# Patient Record
Sex: Male | Born: 1945
Health system: Southern US, Community
[De-identification: ages and names within clinical notes are randomized; demographics above are authoritative.]

## PROBLEM LIST (undated history)

## (undated) DIAGNOSIS — E785 Hyperlipidemia, unspecified: Secondary | ICD-10-CM

## (undated) DIAGNOSIS — N529 Male erectile dysfunction, unspecified: Secondary | ICD-10-CM

## (undated) DIAGNOSIS — I2510 Atherosclerotic heart disease of native coronary artery without angina pectoris: Secondary | ICD-10-CM

## (undated) DIAGNOSIS — I272 Pulmonary hypertension, unspecified: Secondary | ICD-10-CM

## (undated) DIAGNOSIS — Z9289 Personal history of other medical treatment: Secondary | ICD-10-CM

## (undated) DIAGNOSIS — I119 Hypertensive heart disease without heart failure: Secondary | ICD-10-CM

## (undated) DIAGNOSIS — I639 Cerebral infarction, unspecified: Secondary | ICD-10-CM

## (undated) DIAGNOSIS — I219 Acute myocardial infarction, unspecified: Secondary | ICD-10-CM

## (undated) DIAGNOSIS — F410 Panic disorder [episodic paroxysmal anxiety] without agoraphobia: Secondary | ICD-10-CM

## (undated) DIAGNOSIS — Z955 Presence of coronary angioplasty implant and graft: Secondary | ICD-10-CM

## (undated) DIAGNOSIS — E119 Type 2 diabetes mellitus without complications: Secondary | ICD-10-CM

## (undated) DIAGNOSIS — Z720 Tobacco use: Secondary | ICD-10-CM

## (undated) HISTORY — DX: Pulmonary hypertension, unspecified: I27.20

## (undated) HISTORY — PX: LEG SURGERY: SHX1003

## (undated) HISTORY — PX: THUMB ARTHROSCOPY: SHX2509

## (undated) HISTORY — DX: Personal history of other medical treatment: Z92.89

## (undated) HISTORY — DX: Hypertensive heart disease without heart failure: I11.9

## (undated) HISTORY — DX: Acute myocardial infarction, unspecified: I21.9

## (undated) HISTORY — DX: Tobacco use: Z72.0

---

## 2005-12-15 ENCOUNTER — Emergency Department (HOSPITAL_COMMUNITY): Admission: EM | Admit: 2005-12-15 | Discharge: 2005-12-15 | Payer: Self-pay | Admitting: Emergency Medicine

## 2007-09-14 ENCOUNTER — Emergency Department (HOSPITAL_COMMUNITY): Admission: EM | Admit: 2007-09-14 | Discharge: 2007-09-14 | Payer: Self-pay | Admitting: Emergency Medicine

## 2008-07-13 ENCOUNTER — Emergency Department (HOSPITAL_COMMUNITY): Admission: EM | Admit: 2008-07-13 | Discharge: 2008-07-14 | Payer: Self-pay | Admitting: Emergency Medicine

## 2009-02-01 ENCOUNTER — Ambulatory Visit (HOSPITAL_COMMUNITY): Admission: RE | Admit: 2009-02-01 | Discharge: 2009-02-01 | Payer: Self-pay | Admitting: Family Medicine

## 2009-02-04 ENCOUNTER — Encounter: Admission: RE | Admit: 2009-02-04 | Discharge: 2009-02-04 | Payer: Self-pay | Admitting: Family Medicine

## 2009-02-23 ENCOUNTER — Ambulatory Visit: Admission: RE | Admit: 2009-02-23 | Discharge: 2009-02-23 | Payer: Self-pay | Admitting: Family Medicine

## 2009-02-28 ENCOUNTER — Encounter: Admission: RE | Admit: 2009-02-28 | Discharge: 2009-02-28 | Payer: Self-pay | Admitting: Cardiology

## 2009-03-24 ENCOUNTER — Ambulatory Visit (HOSPITAL_COMMUNITY): Admission: RE | Admit: 2009-03-24 | Discharge: 2009-03-24 | Payer: Self-pay | Admitting: Cardiology

## 2009-04-07 ENCOUNTER — Encounter: Admission: RE | Admit: 2009-04-07 | Discharge: 2009-04-07 | Payer: Self-pay | Admitting: Neurology

## 2010-03-16 ENCOUNTER — Encounter: Admission: RE | Admit: 2010-03-16 | Discharge: 2010-04-28 | Payer: Self-pay | Admitting: Orthopedic Surgery

## 2010-04-08 ENCOUNTER — Emergency Department (HOSPITAL_BASED_OUTPATIENT_CLINIC_OR_DEPARTMENT_OTHER): Admission: EM | Admit: 2010-04-08 | Discharge: 2010-04-08 | Payer: Self-pay | Admitting: Emergency Medicine

## 2010-10-15 ENCOUNTER — Emergency Department (INDEPENDENT_AMBULATORY_CARE_PROVIDER_SITE_OTHER): Payer: Medicaid Other

## 2010-10-15 ENCOUNTER — Emergency Department (HOSPITAL_BASED_OUTPATIENT_CLINIC_OR_DEPARTMENT_OTHER)
Admission: EM | Admit: 2010-10-15 | Discharge: 2010-10-15 | Disposition: A | Discharge status: Home / Self Care | Payer: Medicaid Other | Attending: Emergency Medicine | Admitting: Emergency Medicine

## 2010-10-15 DIAGNOSIS — F17200 Nicotine dependence, unspecified, uncomplicated: Secondary | ICD-10-CM | POA: Insufficient documentation

## 2010-10-15 DIAGNOSIS — I10 Essential (primary) hypertension: Secondary | ICD-10-CM | POA: Insufficient documentation

## 2010-10-15 DIAGNOSIS — R509 Fever, unspecified: Secondary | ICD-10-CM

## 2010-10-15 DIAGNOSIS — R05 Cough: Secondary | ICD-10-CM

## 2010-10-15 DIAGNOSIS — R059 Cough, unspecified: Secondary | ICD-10-CM

## 2010-10-15 DIAGNOSIS — J40 Bronchitis, not specified as acute or chronic: Secondary | ICD-10-CM | POA: Insufficient documentation

## 2010-10-21 ENCOUNTER — Emergency Department (HOSPITAL_COMMUNITY)
Admission: EM | Admit: 2010-10-21 | Discharge: 2010-10-22 | Disposition: A | Discharge status: Home / Self Care | Payer: Medicaid Other | Attending: Emergency Medicine | Admitting: Emergency Medicine

## 2010-10-21 DIAGNOSIS — R61 Generalized hyperhidrosis: Secondary | ICD-10-CM | POA: Insufficient documentation

## 2010-10-21 DIAGNOSIS — R0989 Other specified symptoms and signs involving the circulatory and respiratory systems: Secondary | ICD-10-CM | POA: Insufficient documentation

## 2010-10-21 DIAGNOSIS — J40 Bronchitis, not specified as acute or chronic: Secondary | ICD-10-CM | POA: Insufficient documentation

## 2010-10-21 DIAGNOSIS — R05 Cough: Secondary | ICD-10-CM | POA: Insufficient documentation

## 2010-10-21 DIAGNOSIS — R059 Cough, unspecified: Secondary | ICD-10-CM | POA: Insufficient documentation

## 2010-10-21 DIAGNOSIS — E669 Obesity, unspecified: Secondary | ICD-10-CM | POA: Insufficient documentation

## 2010-10-21 DIAGNOSIS — R0602 Shortness of breath: Secondary | ICD-10-CM | POA: Insufficient documentation

## 2010-10-21 DIAGNOSIS — R0609 Other forms of dyspnea: Secondary | ICD-10-CM | POA: Insufficient documentation

## 2010-10-21 DIAGNOSIS — R000 Tachycardia, unspecified: Secondary | ICD-10-CM | POA: Insufficient documentation

## 2010-10-21 DIAGNOSIS — I10 Essential (primary) hypertension: Secondary | ICD-10-CM | POA: Insufficient documentation

## 2010-10-21 DIAGNOSIS — R079 Chest pain, unspecified: Secondary | ICD-10-CM | POA: Insufficient documentation

## 2010-10-21 DIAGNOSIS — Z79899 Other long term (current) drug therapy: Secondary | ICD-10-CM | POA: Insufficient documentation

## 2010-10-21 DIAGNOSIS — R0682 Tachypnea, not elsewhere classified: Secondary | ICD-10-CM | POA: Insufficient documentation

## 2010-10-21 DIAGNOSIS — R062 Wheezing: Secondary | ICD-10-CM | POA: Insufficient documentation

## 2010-10-21 DIAGNOSIS — F411 Generalized anxiety disorder: Secondary | ICD-10-CM | POA: Insufficient documentation

## 2010-10-21 DIAGNOSIS — R6883 Chills (without fever): Secondary | ICD-10-CM | POA: Insufficient documentation

## 2010-10-22 ENCOUNTER — Emergency Department (HOSPITAL_COMMUNITY): Payer: Medicaid Other

## 2010-10-22 LAB — CBC
HCT: 40.8 % (ref 39.0–52.0)
Hemoglobin: 14.1 g/dL (ref 13.0–17.0)
MCH: 30.3 pg (ref 26.0–34.0)
MCHC: 34.6 g/dL (ref 30.0–36.0)
MCV: 87.6 fL (ref 78.0–100.0)
RDW: 13.9 % (ref 11.5–15.5)
WBC: 13.5 10*3/uL — ABNORMAL HIGH (ref 4.0–10.5)

## 2010-10-22 LAB — DIFFERENTIAL
Basophils Absolute: 0.1 10*3/uL (ref 0.0–0.1)
Basophils Relative: 0 % (ref 0–1)
Eosinophils Relative: 2 % (ref 0–5)
Monocytes Absolute: 1.3 10*3/uL — ABNORMAL HIGH (ref 0.1–1.0)
Monocytes Relative: 9 % (ref 3–12)

## 2010-10-22 LAB — BASIC METABOLIC PANEL
Calcium: 8.7 mg/dL (ref 8.4–10.5)
Creatinine, Ser: 1.2 mg/dL (ref 0.4–1.5)
GFR calc non Af Amer: >60 mL/min (ref >60)

## 2010-12-10 LAB — POCT I-STAT 3, VENOUS BLOOD GAS (G3P V)
Bicarbonate: 27.5 mEq/L — ABNORMAL HIGH (ref 20.0–24.0)
O2 Saturation: 64 %
O2 Saturation: 69 %
TCO2: 28 mmol/L (ref 0–100)
TCO2: 29 mmol/L (ref 0–100)
pH, Ven: 7.399 — ABNORMAL HIGH (ref 7.250–7.300)
pH, Ven: 7.411 — ABNORMAL HIGH (ref 7.250–7.300)

## 2010-12-10 LAB — POCT I-STAT 3, ART BLOOD GAS (G3+)
Acid-Base Excess: 1 mmol/L (ref 0.0–2.0)
Bicarbonate: 25.5 mEq/L — ABNORMAL HIGH (ref 20.0–24.0)

## 2011-05-03 DIAGNOSIS — J449 Chronic obstructive pulmonary disease, unspecified: Secondary | ICD-10-CM | POA: Diagnosis present

## 2011-05-03 DIAGNOSIS — F17200 Nicotine dependence, unspecified, uncomplicated: Secondary | ICD-10-CM | POA: Insufficient documentation

## 2011-05-03 DIAGNOSIS — R52 Pain, unspecified: Secondary | ICD-10-CM | POA: Insufficient documentation

## 2011-05-03 DIAGNOSIS — N529 Male erectile dysfunction, unspecified: Secondary | ICD-10-CM | POA: Diagnosis present

## 2011-09-19 ENCOUNTER — Emergency Department (HOSPITAL_BASED_OUTPATIENT_CLINIC_OR_DEPARTMENT_OTHER)
Admission: EM | Admit: 2011-09-19 | Discharge: 2011-09-20 | Disposition: A | Discharge status: Home / Self Care | Payer: Medicare Other | Attending: Emergency Medicine | Admitting: Emergency Medicine

## 2011-09-19 ENCOUNTER — Encounter (HOSPITAL_BASED_OUTPATIENT_CLINIC_OR_DEPARTMENT_OTHER): Payer: Self-pay | Admitting: *Deleted

## 2011-09-19 ENCOUNTER — Emergency Department (INDEPENDENT_AMBULATORY_CARE_PROVIDER_SITE_OTHER): Payer: Medicare Other

## 2011-09-19 DIAGNOSIS — S93409A Sprain of unspecified ligament of unspecified ankle, initial encounter: Secondary | ICD-10-CM

## 2011-09-19 DIAGNOSIS — Z8679 Personal history of other diseases of the circulatory system: Secondary | ICD-10-CM | POA: Insufficient documentation

## 2011-09-19 DIAGNOSIS — F17200 Nicotine dependence, unspecified, uncomplicated: Secondary | ICD-10-CM | POA: Insufficient documentation

## 2011-09-19 DIAGNOSIS — S99929A Unspecified injury of unspecified foot, initial encounter: Secondary | ICD-10-CM

## 2011-09-19 DIAGNOSIS — I10 Essential (primary) hypertension: Secondary | ICD-10-CM | POA: Insufficient documentation

## 2011-09-19 DIAGNOSIS — M25579 Pain in unspecified ankle and joints of unspecified foot: Secondary | ICD-10-CM

## 2011-09-19 DIAGNOSIS — W19XXXA Unspecified fall, initial encounter: Secondary | ICD-10-CM

## 2011-09-19 DIAGNOSIS — X500XXA Overexertion from strenuous movement or load, initial encounter: Secondary | ICD-10-CM | POA: Insufficient documentation

## 2011-09-19 HISTORY — DX: Cerebral infarction, unspecified: I63.9

## 2011-09-19 HISTORY — DX: Panic disorder (episodic paroxysmal anxiety): F41.0

## 2011-09-19 NOTE — ED Notes (Signed)
Pt slipped in mud and fell tonight injuring left ankle, swelling noted.

## 2011-09-20 MED ORDER — IBUPROFEN 800 MG PO TABS
800.0000 mg | ORAL_TABLET | Freq: Once | ORAL | Status: AC
  Administered 2011-09-20: 800 mg via ORAL
  Filled 2011-09-20: qty 1

## 2011-09-20 MED ORDER — IBUPROFEN 800 MG PO TABS: 800.0000 mg | ORAL_TABLET | Freq: Once | ORAL | Status: AC

## 2011-09-20 NOTE — ED Provider Notes (Signed)
History     CSN: [id number]  Arrival date & time 09/19/11  81   First MD Initiated Contact with Patient 09/20/11 0025      Chief Complaint  Patient presents with  . Ankle Pain   Patient states he slipped and twisted his left ankle. Apparently, he was walking on some grass and slipped in the mud. Patient was concerned for the possibility of fracture, but denies any other injuries. Denies any proximal tibial tenderness. Denies any numbness, weakness or tingling. Denies any lacerations. (Consider location/radiation/quality/duration/timing/severity/associated sxs/prior treatment) HPI  Past Medical History  Diagnosis Date  . Stroke   . Hypertension   . Panic attack     History reviewed. No pertinent past surgical history.  History reviewed. No pertinent family history.  History  Substance Use Topics  . Smoking status: Current Everyday Smoker  . Smokeless tobacco: Not on file  . Alcohol Use: No      Review of Systems  All other systems reviewed and are negative.    Allergies  Review of patient's allergies indicates no known allergies.  Home Medications   Current Outpatient Rx  Name Route Sig Dispense Refill  . KLONOPIN PO Oral Take 1 tablet by mouth 3 (three) times daily as needed. For anxiety    . PERCOCET PO Oral Take 1 tablet by mouth 2 (two) times daily as needed. For pain    . PRESCRIPTION MEDICATION Oral Take 1 tablet by mouth daily. Unknown blood pressure medication    . IBUPROFEN 800 MG PO TABS Oral Take 1 tablet (800 mg total) by mouth once. 30 tablet 0    BP 165/83  Pulse 77  Temp(Src) 98.1 F (36.7 C) (Oral)  Resp 20  Ht 5\' 9"  (1.753 m)  Wt 244 lb (110.678 kg)  BMI 36.03 kg/m2  SpO2 98%  Physical Exam  Nursing note and vitals reviewed. Constitutional: He appears well-developed and well-nourished. No distress.  HENT:  Head: Normocephalic.  Eyes: Pupils are equal, round, and reactive to light.  Cardiovascular: Normal heart sounds.     Pulmonary/Chest: Breath sounds normal.  Abdominal: Soft.  Musculoskeletal: Normal range of motion. He exhibits edema and tenderness.       Mild diffuse ankle tenderness and mild swelling. No ecchymoses. No step-offs of deformities. No tibial or fibular tenderness.  Neurological: He is alert.  Skin: Skin is warm and dry.    ED Course  Procedures (including critical care time)  Labs Reviewed - No data to display Dg Ankle Complete Left  09/19/2011  *RADIOLOGY REPORT*  Clinical Data: Status post fall; injury to left ankle, with anterior and lateral left ankle pain.  LEFT ANKLE COMPLETE - 3+ VIEW  Comparison: None.  Findings: There is no evidence of fracture or dislocation.  The ankle mortise is intact; the interosseous space is within normal limits.  No talar tilt or subluxation is seen.  A small well- corticated osseous fragment at the base of the fifth metatarsal may reflect remote injury.  A plantar calcaneal spur is incidentally seen.  The joint spaces are preserved.  Mild lateral soft tissue swelling is noted.  IMPRESSION: No evidence of fracture or dislocation.  Original Report Authenticated By: Santa Lighter, M.D.     1. Ankle sprain       MDM  Patient is seen and examined, initial history and physical is completed. Evaluation initiated        Davian Hanshaw A. Lauris Poag, MD 09/20/11 [phone]

## 2011-12-28 DIAGNOSIS — G8929 Other chronic pain: Secondary | ICD-10-CM | POA: Diagnosis present

## 2012-01-26 DIAGNOSIS — G4700 Insomnia, unspecified: Secondary | ICD-10-CM | POA: Insufficient documentation

## 2012-06-26 DIAGNOSIS — M25519 Pain in unspecified shoulder: Secondary | ICD-10-CM | POA: Diagnosis present

## 2012-06-26 DIAGNOSIS — I639 Cerebral infarction, unspecified: Secondary | ICD-10-CM | POA: Diagnosis present

## 2012-06-26 DIAGNOSIS — F411 Generalized anxiety disorder: Secondary | ICD-10-CM | POA: Insufficient documentation

## 2012-08-04 ENCOUNTER — Inpatient Hospital Stay (HOSPITAL_COMMUNITY)
Admission: EM | Admit: 2012-08-04 | Discharge: 2012-08-07 | DRG: 247 | Disposition: A | Discharge status: Home / Self Care | Payer: Medicare Other | Attending: Cardiovascular Disease | Admitting: Cardiovascular Disease

## 2012-08-04 ENCOUNTER — Encounter (HOSPITAL_COMMUNITY)
Admission: EM | Disposition: A | Discharge status: Home / Self Care | Payer: Self-pay | Source: Home / Self Care | Attending: Cardiovascular Disease

## 2012-08-04 ENCOUNTER — Emergency Department (HOSPITAL_COMMUNITY): Payer: Medicare Other

## 2012-08-04 ENCOUNTER — Encounter (HOSPITAL_COMMUNITY): Payer: Self-pay | Admitting: *Deleted

## 2012-08-04 DIAGNOSIS — I639 Cerebral infarction, unspecified: Secondary | ICD-10-CM | POA: Diagnosis present

## 2012-08-04 DIAGNOSIS — I2119 ST elevation (STEMI) myocardial infarction involving other coronary artery of inferior wall: Principal | ICD-10-CM | POA: Diagnosis present

## 2012-08-04 DIAGNOSIS — J4489 Other specified chronic obstructive pulmonary disease: Secondary | ICD-10-CM | POA: Diagnosis present

## 2012-08-04 DIAGNOSIS — M549 Dorsalgia, unspecified: Secondary | ICD-10-CM | POA: Diagnosis present

## 2012-08-04 DIAGNOSIS — F410 Panic disorder [episodic paroxysmal anxiety] without agoraphobia: Secondary | ICD-10-CM

## 2012-08-04 DIAGNOSIS — I219 Acute myocardial infarction, unspecified: Secondary | ICD-10-CM

## 2012-08-04 DIAGNOSIS — I10 Essential (primary) hypertension: Secondary | ICD-10-CM

## 2012-08-04 DIAGNOSIS — Z8673 Personal history of transient ischemic attack (TIA), and cerebral infarction without residual deficits: Secondary | ICD-10-CM

## 2012-08-04 DIAGNOSIS — E785 Hyperlipidemia, unspecified: Secondary | ICD-10-CM

## 2012-08-04 DIAGNOSIS — E119 Type 2 diabetes mellitus without complications: Secondary | ICD-10-CM

## 2012-08-04 DIAGNOSIS — G8929 Other chronic pain: Secondary | ICD-10-CM | POA: Diagnosis present

## 2012-08-04 DIAGNOSIS — Z79899 Other long term (current) drug therapy: Secondary | ICD-10-CM

## 2012-08-04 DIAGNOSIS — I4510 Unspecified right bundle-branch block: Secondary | ICD-10-CM | POA: Diagnosis present

## 2012-08-04 DIAGNOSIS — F17200 Nicotine dependence, unspecified, uncomplicated: Secondary | ICD-10-CM | POA: Diagnosis present

## 2012-08-04 DIAGNOSIS — I2510 Atherosclerotic heart disease of native coronary artery without angina pectoris: Secondary | ICD-10-CM

## 2012-08-04 DIAGNOSIS — Z6835 Body mass index (BMI) 35.0-35.9, adult: Secondary | ICD-10-CM

## 2012-08-04 DIAGNOSIS — E669 Obesity, unspecified: Secondary | ICD-10-CM | POA: Diagnosis present

## 2012-08-04 DIAGNOSIS — Z955 Presence of coronary angioplasty implant and graft: Secondary | ICD-10-CM

## 2012-08-04 DIAGNOSIS — Z720 Tobacco use: Secondary | ICD-10-CM

## 2012-08-04 DIAGNOSIS — J449 Chronic obstructive pulmonary disease, unspecified: Secondary | ICD-10-CM | POA: Diagnosis present

## 2012-08-04 DIAGNOSIS — I213 ST elevation (STEMI) myocardial infarction of unspecified site: Secondary | ICD-10-CM

## 2012-08-04 HISTORY — PX: PERCUTANEOUS CORONARY STENT INTERVENTION (PCI-S): SHX5485

## 2012-08-04 HISTORY — DX: Atherosclerotic heart disease of native coronary artery without angina pectoris: I25.10

## 2012-08-04 HISTORY — PX: LEFT HEART CATHETERIZATION WITH CORONARY ANGIOGRAM: SHX5451

## 2012-08-04 HISTORY — DX: Hyperlipidemia, unspecified: E78.5

## 2012-08-04 HISTORY — DX: Type 2 diabetes mellitus without complications: E11.9

## 2012-08-04 HISTORY — PX: CARDIAC CATHETERIZATION: SHX172

## 2012-08-04 HISTORY — DX: Presence of coronary angioplasty implant and graft: Z95.5

## 2012-08-04 HISTORY — DX: Acute myocardial infarction, unspecified: I21.9

## 2012-08-04 LAB — BASIC METABOLIC PANEL
BUN: 17 mg/dL (ref 6–23)
CO2: 23 mEq/L (ref 19–32)
Calcium: 9.3 mg/dL (ref 8.4–10.5)
Chloride: 103 mEq/L (ref 96–112)
Creatinine, Ser: 1.1 mg/dL (ref 0.50–1.35)
GFR calc Af Amer: 79 mL/min — ABNORMAL LOW (ref >90)
GFR calc non Af Amer: 68 mL/min — ABNORMAL LOW (ref >90)
Glucose, Bld: 138 mg/dL — ABNORMAL HIGH (ref 70–99)
Potassium: 3.9 mEq/L (ref 3.5–5.1)
Sodium: 137 mEq/L (ref 135–145)

## 2012-08-04 LAB — CBC
HCT: 44.1 % (ref 39.0–52.0)
Hemoglobin: 14.8 g/dL (ref 13.0–17.0)
MCH: 29.6 pg (ref 26.0–34.0)
MCHC: 33.6 g/dL (ref 30.0–36.0)
MCV: 88.2 fL (ref 78.0–100.0)
Platelets: 275 10*3/uL (ref 150–400)
RBC: 5 MIL/uL (ref 4.22–5.81)
RDW: 13.6 % (ref 11.5–15.5)
WBC: 13.8 10*3/uL — ABNORMAL HIGH (ref 4.0–10.5)

## 2012-08-04 LAB — MRSA PCR SCREENING: MRSA by PCR: NEGATIVE

## 2012-08-04 LAB — TROPONIN I
Troponin I: <0.3 ng/mL (ref <0.30)
Troponin I: 0.36 ng/mL (ref <0.30)

## 2012-08-04 SURGERY — LEFT HEART CATHETERIZATION WITH CORONARY ANGIOGRAM
Anesthesia: LOCAL | Laterality: Right

## 2012-08-04 MED ORDER — LIDOCAINE HCL (PF) 1 % IJ SOLN
INTRAMUSCULAR | Status: AC
  Filled 2012-08-04: qty 30

## 2012-08-04 MED ORDER — ASPIRIN EC 81 MG PO TBEC
81.0000 mg | DELAYED_RELEASE_TABLET | Freq: Every day | ORAL | Status: DC
  Administered 2012-08-05 – 2012-08-07 (×3): 81 mg via ORAL
  Filled 2012-08-04 (×3): qty 1

## 2012-08-04 MED ORDER — HEPARIN BOLUS VIA INFUSION
4000.0000 [IU] | Freq: Once | INTRAVENOUS | Status: AC
  Administered 2012-08-04: 4000 [IU] via INTRAVENOUS

## 2012-08-04 MED ORDER — METOPROLOL TARTRATE 1 MG/ML IV SOLN
INTRAVENOUS | Status: AC
  Administered 2012-08-04: 5 mg
  Filled 2012-08-04: qty 15

## 2012-08-04 MED ORDER — TICAGRELOR 90 MG PO TABS
ORAL_TABLET | ORAL | Status: AC
  Filled 2012-08-04: qty 2

## 2012-08-04 MED ORDER — BIVALIRUDIN BOLUS VIA INFUSION
0.1000 mg/kg | Freq: Once | INTRAVENOUS | Status: DC
  Filled 2012-08-04: qty 12

## 2012-08-04 MED ORDER — ALBUTEROL SULFATE (5 MG/ML) 0.5% IN NEBU
2.5000 mg | INHALATION_SOLUTION | RESPIRATORY_TRACT | Status: DC
  Administered 2012-08-04: 2.5 mg via RESPIRATORY_TRACT
  Filled 2012-08-04: qty 0.5

## 2012-08-04 MED ORDER — ONDANSETRON HCL 4 MG/2ML IJ SOLN: 4.0000 mg | Freq: Four times a day (QID) | INTRAMUSCULAR | Status: DC | PRN

## 2012-08-04 MED ORDER — SODIUM CHLORIDE 0.9 % IV SOLN
INTRAVENOUS | Status: DC
  Administered 2012-08-04: 22:00:00 via INTRAVENOUS

## 2012-08-04 MED ORDER — POTASSIUM CHLORIDE 10 MEQ/100ML IV SOLN
INTRAVENOUS | Status: AC
  Filled 2012-08-04: qty 200

## 2012-08-04 MED ORDER — BIVALIRUDIN 250 MG IV SOLR
INTRAVENOUS | Status: AC
  Filled 2012-08-04: qty 250

## 2012-08-04 MED ORDER — SODIUM CHLORIDE 0.9 % IV SOLN
0.2500 mg/kg/h | INTRAVENOUS | Status: AC
  Administered 2012-08-04: 0.25 mg/kg/h via INTRAVENOUS
  Filled 2012-08-04: qty 250

## 2012-08-04 MED ORDER — NITROGLYCERIN 0.2 MG/ML ON CALL CATH LAB
INTRAVENOUS | Status: AC
  Filled 2012-08-04: qty 1

## 2012-08-04 MED ORDER — MORPHINE SULFATE 2 MG/ML IJ SOLN
2.0000 mg | INTRAMUSCULAR | Status: DC | PRN
  Administered 2012-08-05: 2 mg via INTRAVENOUS
  Filled 2012-08-04: qty 1

## 2012-08-04 MED ORDER — ONDANSETRON HCL 4 MG/2ML IJ SOLN
INTRAMUSCULAR | Status: AC
  Administered 2012-08-04: 4 mg
  Filled 2012-08-04: qty 2

## 2012-08-04 MED ORDER — TIOTROPIUM BROMIDE MONOHYDRATE 18 MCG IN CAPS
18.0000 ug | ORAL_CAPSULE | Freq: Every day | RESPIRATORY_TRACT | Status: DC
  Administered 2012-08-05 – 2012-08-07 (×3): 18 ug via RESPIRATORY_TRACT
  Filled 2012-08-04: qty 5

## 2012-08-04 MED ORDER — MIDAZOLAM HCL 2 MG/2ML IJ SOLN
INTRAMUSCULAR | Status: AC
  Filled 2012-08-04: qty 2

## 2012-08-04 MED ORDER — FENTANYL CITRATE 0.05 MG/ML IJ SOLN
INTRAMUSCULAR | Status: AC
  Filled 2012-08-04: qty 2

## 2012-08-04 MED ORDER — CLONAZEPAM 1 MG PO TABS: 1.0000 mg | ORAL_TABLET | Freq: Two times a day (BID) | ORAL | Status: DC | PRN

## 2012-08-04 MED ORDER — ACETAMINOPHEN 325 MG PO TABS: 650.0000 mg | ORAL_TABLET | ORAL | Status: DC | PRN

## 2012-08-04 MED ORDER — CITALOPRAM HYDROBROMIDE 10 MG PO TABS
10.0000 mg | ORAL_TABLET | Freq: Every day | ORAL | Status: DC
  Administered 2012-08-05 – 2012-08-07 (×3): 10 mg via ORAL
  Filled 2012-08-04 (×3): qty 1

## 2012-08-04 MED ORDER — NITROGLYCERIN 0.4 MG SL SUBL: 0.4000 mg | SUBLINGUAL_TABLET | SUBLINGUAL | Status: DC | PRN

## 2012-08-04 MED ORDER — NITROGLYCERIN IN D5W 200-5 MCG/ML-% IV SOLN
2.0000 ug/min | INTRAVENOUS | Status: DC
  Administered 2012-08-04: 20 ug/min via INTRAVENOUS

## 2012-08-04 MED ORDER — METOPROLOL SUCCINATE ER 100 MG PO TB24
100.0000 mg | ORAL_TABLET | Freq: Every day | ORAL | Status: DC
  Administered 2012-08-05: 100 mg via ORAL
  Filled 2012-08-04 (×2): qty 1

## 2012-08-04 MED ORDER — TICAGRELOR 90 MG PO TABS
90.0000 mg | ORAL_TABLET | Freq: Two times a day (BID) | ORAL | Status: DC
  Administered 2012-08-05 – 2012-08-07 (×5): 90 mg via ORAL
  Filled 2012-08-04 (×6): qty 1

## 2012-08-04 MED ORDER — HEPARIN (PORCINE) IN NACL 2-0.9 UNIT/ML-% IJ SOLN
INTRAMUSCULAR | Status: AC
  Filled 2012-08-04: qty 1000

## 2012-08-04 MED ORDER — POTASSIUM CHLORIDE 10 MEQ/100ML IV SOLN
10.0000 meq | INTRAVENOUS | Status: AC
  Administered 2012-08-04 (×2): 10 meq via INTRAVENOUS

## 2012-08-04 MED ORDER — MORPHINE SULFATE 4 MG/ML IJ SOLN
INTRAMUSCULAR | Status: AC
  Administered 2012-08-04: 4 mg
  Filled 2012-08-04: qty 1

## 2012-08-04 MED ORDER — AMLODIPINE BESYLATE 5 MG PO TABS
5.0000 mg | ORAL_TABLET | Freq: Every day | ORAL | Status: DC
  Administered 2012-08-05: 5 mg via ORAL
  Filled 2012-08-04 (×2): qty 1

## 2012-08-04 MED ORDER — HEPARIN (PORCINE) IN NACL 2-0.9 UNIT/ML-% IJ SOLN
INTRAMUSCULAR | Status: AC
  Filled 2012-08-04: qty 500

## 2012-08-04 MED ORDER — SODIUM CHLORIDE 0.9 % IV SOLN: Freq: Once | INTRAVENOUS | Status: DC

## 2012-08-04 MED ORDER — MORPHINE SULFATE 2 MG/ML IJ SOLN
INTRAMUSCULAR | Status: AC
  Administered 2012-08-04: 2 mg via INTRAVENOUS
  Filled 2012-08-04: qty 1

## 2012-08-04 MED ORDER — ATORVASTATIN CALCIUM 80 MG PO TABS
80.0000 mg | ORAL_TABLET | Freq: Every day | ORAL | Status: DC
  Administered 2012-08-04 – 2012-08-06 (×3): 80 mg via ORAL
  Filled 2012-08-04 (×4): qty 1

## 2012-08-04 MED ORDER — HEPARIN (PORCINE) IN NACL 100-0.45 UNIT/ML-% IJ SOLN
1500.0000 [IU]/h | INTRAMUSCULAR | Status: DC
  Administered 2012-08-04: 1500 [IU]/h via INTRAVENOUS
  Filled 2012-08-04: qty 250

## 2012-08-04 NOTE — ED Provider Notes (Signed)
History    66 year old male presenting as a STEMI alert. Partially 45 minutes prior to arrival in emergency room patient began with sudden onset substernal chest pain. Associated with nausea and vomiting and sensation of numbness in both of his arms. Prehospital EKG consistent with STEMI. He received aspirin and sublingual nitroglycerin x3 prior to arrival. On arrival patient states persistent pain although somewhat improved from onset. Patient has a past history of hypertension. He is a smoker. He states that he has previously been evaluated by cardiology and had a cardiac catheterization, but he cannot remember who he specifically saw.   CSN: [id number]  Arrival date & time 08/04/12  1931   None     Chief Complaint  Patient presents with  . Code STEMI    (Consider location/radiation/quality/duration/timing/severity/associated sxs/prior treatment) HPI  Past Medical History  Diagnosis Date  . Stroke   . Hypertension   . Panic attack     No past surgical history on file.  No family history on file.  History  Substance Use Topics  . Smoking status: Current Every Day Smoker  . Smokeless tobacco: Not on file  . Alcohol Use: No      Review of Systems   Review of symptoms negative unless otherwise noted in HPI.   Allergies  Review of patient's allergies indicates no known allergies.  Home Medications   Current Outpatient Rx  Name  Route  Sig  Dispense  Refill  . KLONOPIN PO   Oral   Take 1 tablet by mouth 3 (three) times daily as needed. For anxiety         . PERCOCET PO   Oral   Take 1 tablet by mouth 2 (two) times daily as needed. For pain         . PRESCRIPTION MEDICATION   Oral   Take 1 tablet by mouth daily. Unknown blood pressure medication           BP 109/75  Pulse 91  Resp 14  Ht 5\' 9"  (1.753 m)  Wt 245 lb (111.131 kg)  BMI 36.18 kg/m2  SpO2 92%  Physical Exam  Nursing note and vitals reviewed. Constitutional: He appears  well-developed and well-nourished. He appears distressed.       Sitting up in stretcher. Mildly uncomfortable appearing. Obese.  HENT:  Head: Normocephalic and atraumatic.  Eyes: Conjunctivae normal are normal. Right eye exhibits no discharge. Left eye exhibits no discharge.  Neck: Neck supple.  Cardiovascular: Normal rate, regular rhythm and normal heart sounds.  Exam reveals no gallop and no friction rub.   No murmur heard.      Regular rate and rhythm. No murmur appreciated.  Pulmonary/Chest: Effort normal and breath sounds normal. No respiratory distress.       No increased work of breathing. Breath sounds symmetric bilaterally. Lungs clear.  Abdominal: Soft. He exhibits no distension. There is no tenderness.  Musculoskeletal: He exhibits no edema and no tenderness.       Lower extremities symmetric as compared to each other. No calf tenderness. Negative Homan's. No palpable cords.   Neurological: He is alert.  Skin: Skin is warm and dry. He is not diaphoretic.  Psychiatric: He has a normal mood and affect. His behavior is normal. Thought content normal.    ED Course  Procedures (including critical care time)  Labs Reviewed  CBC - Abnormal; Notable for the following:    WBC 13.8 (*)     All other components within normal limits  BASIC METABOLIC PANEL - Abnormal; Notable for the following:    Glucose, Bld 138 (*)     GFR calc non Af Amer 68 (*)     GFR calc Af Amer 79 (*)     All other components within normal limits  BASIC METABOLIC PANEL - Abnormal; Notable for the following:    Glucose, Bld 110 (*)     GFR calc non Af Amer 85 (*)     All other components within normal limits  CBC - Abnormal; Notable for the following:    WBC 10.7 (*)     All other components within normal limits  HEMOGLOBIN A1C - Abnormal; Notable for the following:    Hemoglobin A1C 6.7 (*)     Mean Plasma Glucose 146 (*)     All other components within normal limits  TROPONIN I - Abnormal; Notable  for the following:    Troponin I 0.36 (*)     All other components within normal limits  TROPONIN I - Abnormal; Notable for the following:    Troponin I 0.98 (*)     All other components within normal limits  TROPONIN I - Abnormal; Notable for the following:    Troponin I 1.42 (*)     All other components within normal limits  LIPID PANEL - Abnormal; Notable for the following:    Cholesterol 257 (*)     Triglycerides 296 (*)     HDL 32 (*)     VLDL 59 (*)     LDL Cholesterol 166 (*)     All other components within normal limits  CK TOTAL AND CKMB - Abnormal; Notable for the following:    CK, MB 19.3 (*)     Relative Index 11.1 (*)     All other components within normal limits  TROPONIN I - Abnormal; Notable for the following:    Troponin I 1.47 (*)     All other components within normal limits  TROPONIN I - Abnormal; Notable for the following:    Troponin I 1.06 (*)     All other components within normal limits  BASIC METABOLIC PANEL - Abnormal; Notable for the following:    Glucose, Bld 111 (*)     GFR calc non Af Amer 67 (*)     GFR calc Af Amer 78 (*)     All other components within normal limits  CBC - Abnormal; Notable for the following:    WBC 10.8 (*)     All other components within normal limits  TROPONIN I - Abnormal; Notable for the following:    Troponin I 0.86 (*)     All other components within normal limits  POCT I-STAT, CHEM 8 - Abnormal; Notable for the following:    Sodium 121 (*)     Potassium 3.2 (*)     Chloride 88 (*)     Calcium, Ion 1.11 (*)     All other components within normal limits  GLUCOSE, CAPILLARY - Abnormal; Notable for the following:    Glucose-Capillary 243 (*)     All other components within normal limits  GLUCOSE, CAPILLARY - Abnormal; Notable for the following:    Glucose-Capillary 115 (*)     All other components within normal limits  GLUCOSE, CAPILLARY - Abnormal; Notable for the following:    Glucose-Capillary 108 (*)     All  other components within normal limits  GLUCOSE, CAPILLARY - Abnormal; Notable for the following:  Glucose-Capillary 138 (*)     All other components within normal limits  GLUCOSE, CAPILLARY - Abnormal; Notable for the following:    Glucose-Capillary 113 (*)     All other components within normal limits  GLUCOSE, CAPILLARY - Abnormal; Notable for the following:    Glucose-Capillary 134 (*)     All other components within normal limits  GLUCOSE, CAPILLARY - Abnormal; Notable for the following:    Glucose-Capillary 104 (*)     All other components within normal limits  TROPONIN I  MRSA PCR SCREENING  TSH  MAGNESIUM  GLUCOSE, CAPILLARY  POCT ACTIVATED CLOTTING TIME  LAB REPORT - SCANNED   Dg Chest Portable 1 View  08/04/2012  *RADIOLOGY REPORT*  Clinical Data: Code stemi.  PORTABLE CHEST - 1 VIEW  Comparison: 10/22/2010.  Findings: Interval enlargement of the cardiac silhouette and prominence of the pulmonary vasculature.  Clear lungs.  Metallic density overlying the inferior aspect of the chest.  Unremarkable bones.  IMPRESSION: Interval cardiomegaly and pulmonary vascular congestion.   Original Report Authenticated By: Claudie Revering, M.D.    EKG:  Rhythm: normal sinus Rate: 70s Axis: normal Intervals: normal ST segments: inferior STE   1. STEMI (ST elevation myocardial infarction)   2. Stented coronary artery   3. STEMI (ST elevation myocardial infarction): Inferior, s/p Promus DES to RCA 08/04/12-emergently   4. HTN (hypertension)   5. Tobacco abuse   6. History of CVA (cerebrovascular accident):  Apprx four years ago.   7. Panic attack, History of..   8. CAD (coronary artery disease),residual non obstructive disease   9. Hyperlipidemia   10. DM (diabetes mellitus), type 2 new diagnosis       MDM  58:21 PM 66 year old male with chest pain, nausea vomiting and numbness in his arms. STEMI alert was called prehospital. Patient's prehospital EKG and repeat upon arrival showed  several millimeters of ST segment elevation inferiorly with reciprocal lateral depression. Anteroseptal involvement as well. Patient received aspirin prehospital. Pharmacy present on patient arrival and working on heparin orders. Deferred from additional nitrates at this time given that patient normotensive and potential for RV involvement. Discussed with Dr Claiborne Billings, cardiology.        Virgel Manifold, MD 08/08/12 [phone]

## 2012-08-04 NOTE — ED Notes (Signed)
Patient taken to the cath lab by [facility], RN

## 2012-08-04 NOTE — Progress Notes (Signed)
Medications obtained from [facility] emergently as pt on the cath lab table with STEMI. Unable to definitely determine last beta blocker administration time. His mother said he left her house today around 1100 to go home and take his medications. Thanks  [employer]. Bobby [name], PharmD, [place] Clinical Staff Pharmacist Pager [phone]

## 2012-08-04 NOTE — ED Notes (Signed)
Patient called EMS with c/o numbness to his arms, N/V all sudden onset   EMS adm Morphine 4mg , Zofran 4mg , ASA 324mg , NTG 3  Continues to c/o pain upon arrival

## 2012-08-04 NOTE — H&P (Signed)
Bobby [name] is an 66 y.o. male.   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  He developed CP at 1800 hrs today.  He reports bilateral arm heaviness, nausea and vomiting.  He had to pull over in his truck twice to throw up.     Medications: albuterol (PROVENTIL) (2.5 MG/3ML) 0.083% nebulizer solution Take 5 mg by nebulization every 4 (four) hours as needed.  amLODipine (NORVASC) 5 MG tablet Take 5 mg by mouth daily.  citalopram (CELEXA) 10 MG tablet Take 10 mg by mouth daily.  clonazePAM (KLONOPIN) 1 MG tablet Take 1 mg by mouth 2 (two) times daily as needed.  hydrochlorothiazide (MICROZIDE) 12.5 MG capsule Take 25 mg by mouth daily.  lisinopril (PRINIVIL,ZESTRIL) 40 MG tablet Take 40 mg by mouth daily. metoprolol succinate (TOPROL-XL) 100 MG 24 hr tablet Take 100 mg by mouth daily. oxyCODONE-acetaminophen (PERCOCET) 10-325 MG per tablet Take 1 tablet by mouth every 8 (eight) hours as needed.  tiotropium (SPIRIVA) 18 MCG inhalation capsule   Past Medical History  Diagnosis Date  . Stroke   . Hypertension   . Panic attack      History reviewed. No pertinent past surgical history.  History reviewed. No pertinent family history. Social History:  reports that he has been smoking.  He does not have any smokeless tobacco history on file. He reports that he does not drink alcohol or use illicit drugs.  Allergies: No Known Allergies  Medications Prior to Admission  Medication Sig Dispense Refill  . albuterol (PROVENTIL) (2.5 MG/3ML) 0.083% nebulizer solution Take 5 mg by nebulization every 4 (four) hours as needed. For wheezing or SOB      . amLODipine (NORVASC) 5 MG tablet Take 5 mg by mouth daily.      . citalopram (CELEXA) 10 MG tablet Take 10 mg by mouth daily.      . clonazePAM (KLONOPIN) 1 MG tablet Take 1 mg by mouth 2 (two) times daily as  needed. For anxiety      . hydrochlorothiazide (MICROZIDE) 12.5 MG capsule Take 25 mg by mouth daily.      [name] lisinopril (PRINIVIL,ZESTRIL) 40 MG tablet Take 40 mg by mouth daily.      . metoprolol succinate (TOPROL-XL) 100 MG 24 hr tablet Take 100 mg by mouth daily. Take with or immediately following a meal.      . oxyCODONE-acetaminophen (PERCOCET) 10-325 MG per tablet Take 1 tablet by mouth every 8 (eight) hours as needed.      . tiotropium (SPIRIVA) 18 MCG inhalation capsule Place 18 mcg into inhaler and inhale daily.        Results for orders placed during the hospital encounter of 08/04/12 (from the past 48 hour(s))  CBC     Status: Abnormal   Collection Time   08/04/12  7:40 PM      Component Value Range Comment   WBC 13.8 (*) 4.0 - 10.5 K/uL    RBC 5.00  4.22 - 5.81 MIL/uL    Hemoglobin 14.8  13.0 - 17.0 g/dL    HCT 44.1  39.0 - 52.0 %    MCV 88.2  78.0 - 100.0 fL    MCH 29.6  26.0 - 34.0 pg    MCHC 33.6  30.0 - 36.0 g/dL    RDW 13.6  11.5 - 15.5 %    Platelets 275  150 - 400 K/uL    Dg Chest Portable 1 View  08/04/2012  *RADIOLOGY REPORT*  Clinical Data: Code stemi.  PORTABLE CHEST - 1 VIEW  Comparison: 10/22/2010.  Findings: Interval enlargement of the cardiac silhouette and prominence of the pulmonary vasculature.  Clear lungs.  Metallic density overlying the inferior aspect of the chest.  Unremarkable bones.  IMPRESSION: Interval cardiomegaly and pulmonary vascular congestion.   Original Report Authenticated By: Claudie Revering, M.D.     Review of Systems  Cardiovascular: Positive for chest pain. Negative for leg swelling.  Gastrointestinal: Positive for nausea and vomiting.  Musculoskeletal: Positive for myalgias (Arms).    Blood pressure 109/75, pulse 91, resp. rate 14, height 5\' 9"  (1.753 m), weight 111.131 kg (245 lb), SpO2 92.00%. Physical Exam   Assessment/Plan Patient Active Hospital Problem List: STEMI (ST elevation myocardial infarction): Inferior (08/04/2012) HTN  (hypertension) (08/04/2012) Tobacco abuse (08/04/2012) History of CVA (cerebrovascular accident):  Apprx four years ago. (08/04/2012) Panic attack, History of.. (08/04/2012)  Plan:  The patient was taken emergently to the cath lab for coronary angiogram.   Bobby [name], Bobby [name] 08/04/2012, 8:25 PM   Patient seen and examined. Agree with assessment and plan.   Bobby Sine, Bobby [name], [facility] 08/04/2012 9:36 PM

## 2012-08-05 ENCOUNTER — Encounter (HOSPITAL_COMMUNITY): Payer: Self-pay | Admitting: Cardiology

## 2012-08-05 DIAGNOSIS — E119 Type 2 diabetes mellitus without complications: Secondary | ICD-10-CM | POA: Diagnosis present

## 2012-08-05 DIAGNOSIS — Z955 Presence of coronary angioplasty implant and graft: Secondary | ICD-10-CM

## 2012-08-05 DIAGNOSIS — E785 Hyperlipidemia, unspecified: Secondary | ICD-10-CM

## 2012-08-05 DIAGNOSIS — I2510 Atherosclerotic heart disease of native coronary artery without angina pectoris: Secondary | ICD-10-CM

## 2012-08-05 HISTORY — DX: Atherosclerotic heart disease of native coronary artery without angina pectoris: I25.10

## 2012-08-05 HISTORY — DX: Presence of coronary angioplasty implant and graft: Z95.5

## 2012-08-05 HISTORY — DX: Hyperlipidemia, unspecified: E78.5

## 2012-08-05 HISTORY — DX: Type 2 diabetes mellitus without complications: E11.9

## 2012-08-05 LAB — BASIC METABOLIC PANEL
BUN: 14 mg/dL (ref 6–23)
Calcium: 8.9 mg/dL (ref 8.4–10.5)
Creatinine, Ser: 0.95 mg/dL (ref 0.50–1.35)
GFR calc Af Amer: >90 mL/min (ref >90)
GFR calc non Af Amer: 85 mL/min — ABNORMAL LOW (ref >90)

## 2012-08-05 LAB — POCT I-STAT, CHEM 8
BUN: 17 mg/dL (ref 6–23)
Calcium, Ion: 1.11 mmol/L — ABNORMAL LOW (ref 1.13–1.30)
Creatinine, Ser: 0.8 mg/dL (ref 0.50–1.35)
Glucose, Bld: 95 mg/dL (ref 70–99)
Hemoglobin: 15 g/dL (ref 13.0–17.0)
Sodium: 121 mEq/L — ABNORMAL LOW (ref 135–145)
TCO2: 20 mmol/L (ref 0–100)

## 2012-08-05 LAB — CK TOTAL AND CKMB (NOT AT ARMC)
CK, MB: 19.3 ng/mL (ref 0.3–4.0)
Relative Index: 11.1 — ABNORMAL HIGH (ref 0.0–2.5)
Total CK: 174 U/L (ref 7–232)

## 2012-08-05 LAB — CBC
MCHC: 32.9 g/dL (ref 30.0–36.0)
Platelets: 217 10*3/uL (ref 150–400)
RDW: 13.7 % (ref 11.5–15.5)
WBC: 10.7 10*3/uL — ABNORMAL HIGH (ref 4.0–10.5)

## 2012-08-05 LAB — HEMOGLOBIN A1C: Hgb A1c MFr Bld: 6.7 % — ABNORMAL HIGH (ref <5.7)

## 2012-08-05 LAB — TROPONIN I
Troponin I: 1.42 ng/mL (ref <0.30)
Troponin I: 1.47 ng/mL (ref <0.30)
Troponin I: 1.06 ng/mL (ref <0.30)

## 2012-08-05 LAB — LIPID PANEL: Cholesterol: 257 mg/dL — ABNORMAL HIGH (ref 0–200)

## 2012-08-05 LAB — TSH: TSH: 0.406 u[IU]/mL (ref 0.350–4.500)

## 2012-08-05 MED ORDER — ALBUTEROL SULFATE (5 MG/ML) 0.5% IN NEBU: 2.5000 mg | INHALATION_SOLUTION | RESPIRATORY_TRACT | Status: DC | PRN

## 2012-08-05 MED ORDER — INSULIN ASPART 100 UNIT/ML ~~LOC~~ SOLN
0.0000 [IU] | Freq: Three times a day (TID) | SUBCUTANEOUS | Status: DC
  Administered 2012-08-06 – 2012-08-07 (×2): 1 [IU] via SUBCUTANEOUS

## 2012-08-05 MED ORDER — LISINOPRIL 5 MG PO TABS
5.0000 mg | ORAL_TABLET | Freq: Two times a day (BID) | ORAL | Status: DC
  Filled 2012-08-05 (×2): qty 1

## 2012-08-05 MED ORDER — ATROPINE SULFATE 1 MG/ML IJ SOLN
INTRAMUSCULAR | Status: AC
  Filled 2012-08-05: qty 1

## 2012-08-05 MED ORDER — LISINOPRIL 10 MG PO TABS
10.0000 mg | ORAL_TABLET | Freq: Two times a day (BID) | ORAL | Status: DC
  Administered 2012-08-05 (×2): 10 mg via ORAL
  Filled 2012-08-05 (×4): qty 1

## 2012-08-05 MED ORDER — ZOLPIDEM TARTRATE 5 MG PO TABS
5.0000 mg | ORAL_TABLET | Freq: Every evening | ORAL | Status: DC | PRN
  Administered 2012-08-05 (×2): 5 mg via ORAL
  Filled 2012-08-05 (×2): qty 1

## 2012-08-05 MED ORDER — INSULIN ASPART 100 UNIT/ML ~~LOC~~ SOLN
0.0000 [IU] | Freq: Every day | SUBCUTANEOUS | Status: DC
Start: 2012-08-05 — End: 2012-08-05

## 2012-08-05 MED ORDER — PANTOPRAZOLE SODIUM 40 MG PO TBEC
40.0000 mg | DELAYED_RELEASE_TABLET | Freq: Every day | ORAL | Status: DC
  Administered 2012-08-05 – 2012-08-07 (×3): 40 mg via ORAL
  Filled 2012-08-05 (×3): qty 1

## 2012-08-05 MED ORDER — NICOTINE 14 MG/24HR TD PT24
14.0000 mg | MEDICATED_PATCH | Freq: Every day | TRANSDERMAL | Status: DC
  Administered 2012-08-05 – 2012-08-06 (×2): 14 mg via TRANSDERMAL
  Filled 2012-08-05 (×3): qty 1

## 2012-08-05 MED FILL — Dextrose Inj 5%: INTRAVENOUS | Qty: 50 | Status: AC

## 2012-08-05 NOTE — Progress Notes (Signed)
Subjective: No chest pain   Objective: Vital signs in last 24 hours: Temp:  [97.3 F (36.3 C)-97.9 F (36.6 C)] 97.9 F (36.6 C) (12/03 0400) Pulse Rate:  [60-91] 69  (12/03 0700) Resp:  [11-17] 15  (12/03 0700) BP: (109-159)/(67-86) 138/73 mmHg (12/03 0700) SpO2:  [92 %-100 %] 98 % (12/03 0700) Arterial Line BP: (123-152)/(67-81) 149/80 mmHg (12/03 0130) Weight:  [110.6 kg (243 lb 13.3 oz)-111.131 kg (245 lb)] 110.6 kg (243 lb 13.3 oz) (12/02 2201) Weight change:  Last BM Date: 08/04/12 Intake/Output from previous day:  +129 12/02 0701 - 12/03 0700 In: 1510.7 [I.V.:1310.7; IV Piggyback:200] Out: 1250 [Urine:1250] Intake/Output this shift:    PE: General:alert and oriented, no complaints Heart:S1S2 RRR Lungs:clear Abd:+ BS soft non tender Ext:no edema, groin stable, pulses strong Neuro:alert and oriented   Lab Results:  Basename 08/05/12 0430 08/04/12 1940  WBC 10.7* 13.8*  HGB 14.3 14.8  HCT 43.5 44.1  PLT 217 275   BMET  Basename 08/05/12 0430 08/04/12 1940  NA 139 137  K 4.3 3.9  CL 103 103  CO2 25 23  GLUCOSE 110* 138*  BUN 14 17  CREATININE 0.95 1.10  CALCIUM 8.9 9.3    Basename 08/05/12 0430 08/04/12 2250  TROPONINI 0.98* 0.36*    Lab Results  Component Value Date   CHOL 257* 08/05/2012   HDL 32* 08/05/2012   LDLCALC 166* 08/05/2012   TRIG 296* 08/05/2012   CHOLHDL 8.0 08/05/2012   No results found for this basename: HGBA1C     No results found for this basename: TSH    Hepatic Function Panel No results found for this basename: PROT,ALBUMIN,AST,ALT,ALKPHOS,BILITOT,BILIDIR,IBILI in the last 72 hours  Basename 08/05/12 0430  CHOL 257*   No results found for this basename: PROTIME in the last 72 hours    EKG: Orders placed during the hospital encounter of 08/04/12  . EKG 12-LEAD  . EKG 12-LEAD  . EKG 12-LEAD  . EKG 12-LEAD  . EKG 12-LEAD  . EKG 12-LEAD  . EKG 12-LEAD  . EKG 12-LEAD    Studies/Results: Dg Chest Portable 1  View  08/04/2012  *RADIOLOGY REPORT*  Clinical Data: Code stemi.  PORTABLE CHEST - 1 VIEW  Comparison: 10/22/2010.  Findings: Interval enlargement of the cardiac silhouette and prominence of the pulmonary vasculature.  Clear lungs.  Metallic density overlying the inferior aspect of the chest.  Unremarkable bones.  IMPRESSION: Interval cardiomegaly and pulmonary vascular congestion.   Original Report Authenticated By: Claudie Revering, M.D.     Medications: I have reviewed the patient's current medications.    . sodium chloride   Intravenous Once  . amLODipine  5 mg Oral Daily  . aspirin EC  81 mg Oral Daily  . atorvastatin  80 mg Oral q1800  . [COMPLETED] bivalirudin      . bivalirudin  0.1 mg/kg Intravenous Once  . citalopram  10 mg Oral Daily  . [COMPLETED] fentaNYL      . [COMPLETED] fentaNYL      . [COMPLETED] heparin      . [COMPLETED] heparin      . [COMPLETED] heparin  4,000 Units Intravenous Once  . [COMPLETED] lidocaine      . [COMPLETED] metoprolol      . metoprolol succinate  100 mg Oral Daily  . [COMPLETED] midazolam      . [COMPLETED] midazolam      . [COMPLETED] morphine      . [COMPLETED] morphine      . [  COMPLETED] nitroGLYCERIN      . [COMPLETED] ondansetron      . [COMPLETED] potassium chloride  10 mEq Intravenous Q1 Hr x 2  . [COMPLETED] Ticagrelor      . Ticagrelor  90 mg Oral BID  . tiotropium  18 mcg Inhalation Daily  . [DISCONTINUED] albuterol  2.5 mg Nebulization Q4H   Assessment/Plan: Principal Problem:  *STEMI (ST elevation myocardial infarction): Inferior Active Problems:  HTN (hypertension)  Tobacco abuse  History of CVA (cerebrovascular accident):  Apprx four years ago.  Panic attack, History of..  CAD (coronary artery disease),residual non obstructive disease  Hyperlipidemia  S/P coronary artery stent placement, to RCA Promus DES  PLAN: D/C NTG, transfer to stepdown.  Cardiac rehab.  LOS: 1 day   INGOLD,LAURA R 08/05/2012, 8:07 AM   Agree  with note written by Cecilie Kicks RNP  S/P inf STEMI, PCI/Stent with DES. Non critical CAD otherwise with preserved LV fxn. Low troponin. On appropriate meds. Exam benign. Groin OK. Can D/C iv NTG. Transfer to step down. [facility]. Smoking cessation. Prob home 48 hours.  Lorretta Harp 08/05/2012 8:10 AM

## 2012-08-05 NOTE — Cardiovascular Report (Signed)
NAME:  Bobby [name], Bobby [name] NO.:  [id number]  MEDICAL RECORD NO.:  [id number]  LOCATION:  2902                         FACILITY:  [facility]  PHYSICIAN:  Shelva Majestic, M.D.     DATE OF BIRTH:  02-Dec-1945  DATE OF PROCEDURE:  08/04/2012 DATE OF DISCHARGE:                           CARDIAC CATHETERIZATION   INDICATIONS:  Bobby [name] is a 66 year old gentleman, who has a longstanding history of tobacco use, history of hypertension, as well as who apparently suffered a small TIA/CVA 4 years ago without residual deficits.  He developed chest pain this evening at approximately 6 p.m. and ultimately was transported to via [place] to [facility] Emergency Room, where he was in coded as a ST-segment elevation inferior wall myocardial infarction with ST elevation in leads 2, 3 and F, V3, V4 with ST depression in aVL and V2.  Upon arrival to [facility], he was still having chest pain.  In the emergency room, he was treated with heparin 4000 units, was started on a drip.  He also was given morphine and aspirin. He is brought to the catheterization laboratory for acute catheterization.  PROCEDURE:  Upon arrival to the catheterization laboratory, the patient's chest pain was still present, but improved in intensity. Right femoral artery was punctured anteriorly and a 6-French sheath was inserted without difficulty.  Versed 2 mg plus fentanyl 50 mcg was initially given for sedation.  He did receive an additional 1 mg and 25 mcg, respectively of Versed and fentanyl shortly after the beginning of the procedure.  Diagnostic catheterization was done utilizing a 6-French FL4 left catheter.  With presumption that the RCA was the significant lesion.  A right guide was inserted initially FR4.  The patient was started on Angiomax bolus plus infusion and was given 180 mg of Brilinta.  He did not have any history of prior subarachnoid bleed.  He was not a candidate for Effient due to his prior  possible small stroke. [place]ater wire was initially advanced across the subtotal occlusion, but seemed to initially go into the anterior marginal branch arising from the lesion.  Ultimately, this wire then became bent and was exchanged for a Luge wire.  A 2.0 x12 mm legend balloon was then inserted.  After the 99% stenosis, there did appear to be evidence for dissection in the mid RCA beyond this site.  Initial dilatation was done at 4, 7, and 10 atmospheres at the initial site.  Inflations were made in the mid segment beyond the 99% stenosis at 6 and 8 atmospheres.  Since it did appear that there was some extension of the dissection, a 2.0 x 25 mm mini trek was then inserted.  Three inflations were made at 6 and 8 atmospheres up to a maximum of 2 minutes which did show a significant improvement and appeared to tack up the dissection with resumption of TIMI-3 flow distally.  A 2.25 x32 mm PROMUS Element DES stent was then inserted to cover the entire lesion.  This was dilated x2 at 10 and 12 atmospheres.  A 2.5 x 20 mm [place] trek was used for post stent dilatation with a taper from 2.38 in the proximal portion  of the stent to 2.30 at the most distal portion of the stent.  There was brisk TIMI-3 flow. There was no evidence for dissection.  There is resolution of the patient's chest pain.  Six-French pigtail catheter was then inserted and RAO ventriculography was performed.  With the patient's hypertensive history, distal aortography was also performed to make certain he did not have any renovascular etiology to his hypertension.  During the procedure, he received several doses of intracoronary nitroglycerin as well and was started on IV nitroglycerin drip and titrated up to 20 mcg.  He also received several additional doses of fentanyl and an additional mg of Versed.  He left the catheterization laboratory with stable hemodynamics pain free.  The arterial sheath was sutured in place with  plans for sheath removal, following Angiomax infusion which will be maintained for approximately 3 additional hours following the procedure.  HEMODYNAMIC DATA:  Central aortic pressure 130/73.  Left ventricular pressure 130/12.  ANGIOGRAPHIC DATA:  Left main coronary artery was angiographically normal and bifurcated into an LAD and a very large dominant circumflex vessel.  The LAD had 40-50% narrowing proximal to giving rise to the first septal perforating artery.  The LAD was large caliber and wrapped around the LV apex.  The circumflex vessel was a dominant vessel.  There was narrowing of 30% and a diminutive OM1 vessel.  The OM2 vessel was very large and had segmental 20-30% narrowings.  The distal circumflex ended in the PDA, PLA vessel distally and there was a third marginal vessel proximal to this.  The right coronary artery was a small caliber nondominant vessel that had 99.9% stenosis right at the bifurcation of a marginal branch and the mid circumflex.  Following initial inflation, there was also evidence for dissection in the mid segment.  Following ultimate stenting with a 2.25 x32 mm PROMUS DES stent post dilated with a taper from 2.38 to 2.30 mm, the entire mid RCA was reduced to 0%.  There was brisk TIMI-3 flow. There was no evidence for dissection.  Left ventriculography revealed normal LV function with an EF of 55%. There was a very minimal region of the distal inferior subtle hypocontractility.  Distal aortography did not demonstrate any renal artery stenosis.  There did not appeared to be any significant aortoiliac disease.  Next, total balloon time 29 minutes.  IMPRESSION: 1. Acute inferior ST-segment elevation myocardial infarction secondary     to subtotal occlusion of the right coronary artery (nondominant). 2. Multivessel coronary artery disease at 40-50% narrowing in the     proximal LAD, 20-30% narrowing in a OM2 branch of the dominant left      circumflex coronary artery and subtotal occlusion stenosis of the     right coronary artery with evidence for dissection beyond the     initial lesion. 3. Successful percutaneous coronary intervention to the right coronary     artery with ultimate insertion of a 2.25 x32 mm PROMUS DES stent     post dilated at 2.38 mm tapering to 2.30 mm. 4. Angiomax/IC and IV nitroglycerin/180 mg oral Brilinta. 5. Total balloon time 29 minutes.          ______________________________ Shelva Majestic, M.D.     TK/MEDQ  D:  08/04/2012  T:  08/05/2012  Job:  [id number]

## 2012-08-05 NOTE — Progress Notes (Signed)
Smoking cessation info given and explained to pt.

## 2012-08-05 NOTE — Progress Notes (Signed)
HgbA1c is elevated at 6.7, pt with no known history of diabetes or borderline diabetes.  He discussed need for dietary changes, CBGs here in hospital and probable need for oral agent as outpt.  Pt agreeable.

## 2012-08-05 NOTE — Progress Notes (Signed)
Echocardiogram 2D Echocardiogram has been performed.  Bobby [name] 08/05/2012, 2:54 PM

## 2012-08-05 NOTE — Progress Notes (Signed)
CARDIAC REHAB PHASE I   PRE:  Rate/Rhythm: 66SR  BP:  Supine: 120/67  Sitting:   Standing:    SaO2: 100%RA  MODE:  Ambulation: 350 ft   POST:  Rate/Rhythem: 75SR  BP:  Supine:   Sitting: 139/65  Standing:    SaO2: 95%RA 1010-1105 Pt walked 350 ft with asst x1 with steady gait. Tired by end of walk but no CP. Tolerated well. Reinforced smoking cessation. Gave pt fake cigarette. To recliner with call bell after walk. Education began. Will continue ed tomorrow.   Jeani Sow

## 2012-08-05 NOTE — Care Management Note (Addendum)
    Page 1 of 1   08/05/2012     1:56:42 PM   CARE MANAGEMENT NOTE 08/05/2012  Patient:  Bobby [name],Bobby [name]   Account Number:  [id number]  Date Initiated:  08/05/2012  Documentation initiated by:  Elissa Hefty  Subjective/Objective Assessment:   adm w mi     Action/Plan:   lives alone   Anticipated DC Date:     Anticipated DC Plan:        DC Planning Services  CM consult      Choice offered to / List presented to:             Status of service:   Medicare Important Message given?   (If response is "NO", the following Medicare IM given date fields will be blank) Date Medicare IM given:   Date Additional Medicare IM given:    Discharge Disposition:    Per UR Regulation:  Reviewed for med. necessity/level of care/duration of stay  If discussed at [place] of Stay Meetings, dates discussed:    Comments:  12/3 9:25a Bobby Irineo Gaulin rn,bsn [id number] gave pt brilinta 30 day free card. cm sec checked and approx 3.50 copay for brilinta.

## 2012-08-06 LAB — CBC
HCT: 43.9 % (ref 39.0–52.0)
Hemoglobin: 14.5 g/dL (ref 13.0–17.0)
MCH: 28.7 pg (ref 26.0–34.0)
MCHC: 33 g/dL (ref 30.0–36.0)
RDW: 13.7 % (ref 11.5–15.5)

## 2012-08-06 LAB — BASIC METABOLIC PANEL
BUN: 16 mg/dL (ref 6–23)
Chloride: 102 mEq/L (ref 96–112)
Creatinine, Ser: 1.11 mg/dL (ref 0.50–1.35)
GFR calc Af Amer: 78 mL/min — ABNORMAL LOW (ref >90)
GFR calc non Af Amer: 67 mL/min — ABNORMAL LOW (ref >90)
Glucose, Bld: 111 mg/dL — ABNORMAL HIGH (ref 70–99)
Potassium: 3.8 mEq/L (ref 3.5–5.1)

## 2012-08-06 LAB — GLUCOSE, CAPILLARY
Glucose-Capillary: 115 mg/dL — ABNORMAL HIGH (ref 70–99)
Glucose-Capillary: 108 mg/dL — ABNORMAL HIGH (ref 70–99)
Glucose-Capillary: 113 mg/dL — ABNORMAL HIGH (ref 70–99)

## 2012-08-06 MED ORDER — OMEGA-3-ACID ETHYL ESTERS 1 G PO CAPS
1.0000 g | ORAL_CAPSULE | Freq: Two times a day (BID) | ORAL | Status: DC
  Administered 2012-08-06 – 2012-08-07 (×3): 1 g via ORAL
  Filled 2012-08-06 (×5): qty 1

## 2012-08-06 MED ORDER — LIVING WELL WITH DIABETES BOOK
Freq: Once | Status: AC
  Administered 2012-08-06: 11:00:00
  Filled 2012-08-06: qty 1

## 2012-08-06 MED ORDER — LISINOPRIL 20 MG PO TABS
20.0000 mg | ORAL_TABLET | Freq: Two times a day (BID) | ORAL | Status: DC
  Administered 2012-08-06 – 2012-08-07 (×3): 20 mg via ORAL
  Filled 2012-08-06 (×5): qty 1

## 2012-08-06 MED ORDER — METOPROLOL SUCCINATE ER 50 MG PO TB24
50.0000 mg | ORAL_TABLET | Freq: Every day | ORAL | Status: DC
  Administered 2012-08-07: 50 mg via ORAL
  Filled 2012-08-06: qty 1

## 2012-08-06 MED ORDER — AMLODIPINE BESYLATE 10 MG PO TABS
10.0000 mg | ORAL_TABLET | Freq: Every day | ORAL | Status: DC
  Administered 2012-08-06 – 2012-08-07 (×2): 10 mg via ORAL
  Filled 2012-08-06 (×2): qty 1

## 2012-08-06 MED ORDER — ATROPINE SULFATE 1 MG/ML IJ SOLN
INTRAMUSCULAR | Status: AC
  Filled 2012-08-06: qty 1

## 2012-08-06 NOTE — Progress Notes (Signed)
Pt had three episodes of 3.0-3.5 second heart rate pauses on monitor. Pt was asleep.  Woke up patient and checked BP.  143/65.  Pt stated he felt fine.  [place], [place].  Told to hold morning metoprolol until pt has been rounded on and examined.  HR now in the 60's.  Will continue to monitor.

## 2012-08-06 NOTE — Progress Notes (Signed)
CARDIAC REHAB PHASE I   PRE:  Rate/Rhythm: 75SR  BP:  Supine:   Sitting: 147/80  Standing:    SaO2:   MODE:  Ambulation: 740 ft   POST:  Rate/Rhythem: 92SR PVCs  BP:  Supine:   Sitting: 144/61  Standing:    SaO2:  1015-1105 Pt walked 740 ft on RA with steady gait. Tolerated well. No c/o CP. Education completed. Discussed diabetic and heart healthy diets. Encouraged pt to watch diabetic videos. Discussed CRP 2 and permission given to refer to [facility] program. Pt seems motivated today to make changes.  Jeani Sow

## 2012-08-06 NOTE — Progress Notes (Signed)
Inpatient Diabetes Program Recommendations  AACE/ADA: New Consensus Statement on Inpatient Glycemic Control (2013)  Target Ranges:  Prepandial:   less than 140 mg/dL      Peak postprandial:   less than 180 mg/dL (1-2 hours)      Critically ill patients:  140 - 180 mg/dL   Reason for Visit: Spoke to patient regarding new onset diabetes.  He states "I have diabetes??".  Discussed A1C results.  He has PCP who he states follows his labs closely but admits to gaining weight recently.  Ordered living well with diabetes booklet for patient, videos and dietician consult.  Spoke briefly regarding the ABC's of diabetes and the importance of follow-up.  RN states she will show patient videos regarding diabetes.  Patient states "I don't think I have diabetes".  Will need follow-up.

## 2012-08-06 NOTE — Plan of Care (Signed)
Problem: Food- and Nutrition-Related Knowledge Deficit (NB-1.1) Goal: Nutrition education Formal process to instruct or train a patient/client in a skill or to impart knowledge to help patients/clients voluntarily manage or modify food choices and eating behavior to maintain or improve health.  Outcome: Completed/Met Date Met:  08/06/12  RD consulted for nutrition education regarding new onset diabetes.     Lab Results  Component Value Date    HGBA1C 6.7* 08/05/2012    RD provided "Carbohydrate Counting for People with Diabetes" handout from the Academy of Nutrition and Dietetics. Discussed different food groups and their effects on blood sugar, emphasizing carbohydrate-containing foods. Provided list of carbohydrates and recommended serving sizes of common foods. Emphasized smaller portion sizes of carb foods and eating high carb foods with protein.   Discussed importance of controlled and consistent carbohydrate intake throughout the day. Provided examples of ways to balance meals/snacks and encouraged intake of high-fiber, whole grain complex carbohydrates.  Pt states that he "dont have that" referring to DM. Thinks that when he leaves the hospital his sugars will be fine. Explained A1c.   Expect poor compliance.  Body mass index is 35.68 kg/(m^2). Pt meets criteria for obesity class 2 based on current BMI.  Current diet order is Carb Mod Medium, patient is consuming approximately 100% of meals at this time. Labs and medications reviewed. No further nutrition interventions warranted at this time. RD contact information provided. If additional nutrition issues arise, please re-consult RD.  Orson Slick RD, LDN Pager [phone] After Hours pager [phone]

## 2012-08-06 NOTE — Progress Notes (Signed)
Pt. Seen and examined. Agree with the NP/PA-C note as written.  2 days post-inferior STEMI. Small RCA with relatively little troponin elevation, fortunately LVEF is preserved. He was noted to have 3-3.5 sec pauses overnight, high likelihood of sleep apnea +/- RCA negative chronotropic effects. Agree with decreasing toprol to 50 mg.  Will need outpatient sleep study. Would not recommend pacemaker due to possible reversible causes of his pauses.  Pixie Casino, MD, [facility] Attending Cardiologist [facility]

## 2012-08-06 NOTE — Progress Notes (Signed)
[facility]  Subjective: No Further CP.  Objective: Vital signs in last 24 hours: Temp:  [97.6 F (36.4 C)-98 F (36.7 C)] 97.6 F (36.4 C) (12/04 0742) Pulse Rate:  [54-72] 54  (12/04 0742) Resp:  [13-17] 17  (12/04 0742) BP: (113-152)/(48-97) 152/97 mmHg (12/04 0742) SpO2:  [93 %-98 %] 95 % (12/04 0742) Weight:  [109.6 kg (241 lb 10 oz)] 109.6 kg (241 lb 10 oz) (12/04 0433) Last BM Date: 08/04/12  Intake/Output from previous day: 12/03 0701 - 12/04 0700 In: 2308 [P.O.:1680; I.V.:628] Out: 2050 [Urine:2050] Intake/Output this shift:    Medications Current Facility-Administered Medications  Medication Dose Route Frequency Provider Last Rate Last Dose  . 0.9 %  sodium chloride infusion   Intravenous Once Virgel Manifold, MD 125 mL/hr at 08/05/12 0406    . 0.9 %  sodium chloride infusion   Intravenous Continuous Troy Sine, MD 150 mL/hr at 08/04/12 2226    . acetaminophen (TYLENOL) tablet 650 mg  650 mg Oral Q4H PRN Troy Sine, MD      . albuterol (PROVENTIL) (5 MG/ML) 0.5% nebulizer solution 2.5 mg  2.5 mg Nebulization Q4H PRN Provider Default, MD      . amLODipine (NORVASC) tablet 5 mg  5 mg Oral Daily Tarri Fuller, PA   5 mg at 08/05/12 0853  . aspirin EC tablet 81 mg  81 mg Oral Daily Troy Sine, MD   81 mg at 08/05/12 [id number]  . atorvastatin (LIPITOR) tablet 80 mg  80 mg Oral q1800 Tarri Fuller, PA   80 mg at 08/05/12 1729  . bivalirudin (ANGIOMAX) LOAD via infusion 11.2 mg  0.1 mg/kg Intravenous Once Troy Sine, MD      . citalopram (CELEXA) tablet 10 mg  10 mg Oral Daily Tarri Fuller, PA   10 mg at 08/05/12 0853  . clonazePAM (KLONOPIN) tablet 1 mg  1 mg Oral BID PRN Tarri Fuller, PA      . insulin aspart (novoLOG) injection 0-9 Units  0-9 Units Subcutaneous TID WC Cecilie Kicks, NP      . lisinopril (PRINIVIL,ZESTRIL) tablet 20 mg  20 mg Oral BID Leonie Man, MD      . metoprolol succinate (TOPROL-XL) 24 hr tablet 100 mg  100 mg  Oral Daily Tarri Fuller, PA   100 mg at 08/05/12 0853  . morphine 2 MG/ML injection 2 mg  2 mg Intravenous Q1H PRN Troy Sine, MD   2 mg at 08/05/12 0136  . nicotine (NICODERM CQ - dosed in mg/24 hours) patch 14 mg  14 mg Transdermal Daily Cecilie Kicks, NP   14 mg at 08/05/12 1046  . nitroGLYCERIN (NITROSTAT) SL tablet 0.4 mg  0.4 mg Sublingual Q5 Min x 3 PRN Tarri Fuller, PA      . ondansetron (ZOFRAN) injection 4 mg  4 mg Intravenous Q6H PRN Troy Sine, MD      . pantoprazole (PROTONIX) EC tablet 40 mg  40 mg Oral Daily Cecilie Kicks, NP   40 mg at 08/05/12 1023  . Ticagrelor (BRILINTA) tablet 90 mg  90 mg Oral BID Troy Sine, MD   90 mg at 08/05/12 2215  . tiotropium (SPIRIVA) inhalation capsule 18 mcg  18 mcg Inhalation Daily Tarri Fuller, PA   18 mcg at 08/05/12 0844  . zolpidem (AMBIEN) tablet 5 mg  5 mg Oral QHS PRN Troy Sine, MD   5 mg at 08/05/12 2317  . [  DISCONTINUED] atropine 1 MG/ML injection           . [DISCONTINUED] insulin aspart (novoLOG) injection 0-5 Units  0-5 Units Subcutaneous QHS Cecilie Kicks, NP      . [DISCONTINUED] lisinopril (PRINIVIL,ZESTRIL) tablet 10 mg  10 mg Oral BID Cecilie Kicks, NP   10 mg at 08/05/12 2216  . [DISCONTINUED] lisinopril (PRINIVIL,ZESTRIL) tablet 5 mg  5 mg Oral BID Cecilie Kicks, NP      . [DISCONTINUED] nitroGLYCERIN 0.2 mg/mL in dextrose 5 % infusion  2-200 mcg/min Intravenous Continuous Troy Sine, MD 6 mL/hr at 08/04/12 2100 20 mcg/min at 08/04/12 2100    PE: General appearance: alert, cooperative and no distress Lungs: BS decreased but no wheeze or rales. Heart: regular rate and rhythm, S1, S2 normal, no murmur, click, rub or gallop Extremities: No LEE Pulses: 2+ and symmetric Skin: Right Groin:  Nontender, no ecchymosis or hematoma. Neurologic: Grossly normal  Lab Results:   Basename 08/06/12 0505 08/05/12 0430 08/04/12 2030 08/04/12 1940  WBC 10.8* 10.7* -- 13.8*  HGB 14.5 14.3 15.0 --  HCT 43.9 43.5 44.0 --  PLT  190 217 -- 275   BMET  Basename 08/06/12 0505 08/05/12 0430 08/04/12 2030 08/04/12 1940  NA 137 139 121* --  K 3.8 4.3 3.2* --  CL 102 103 88* --  CO2 25 25 -- 23  GLUCOSE 111* 110* 95 --  BUN 16 14 17  --  CREATININE 1.11 0.95 0.80 --  CALCIUM 9.4 8.9 -- 9.3   PT/INR No results found for this basename: LABPROT:3,INR:3 in the last 72 hours Cholesterol  Basename 08/05/12 0430  CHOL 257*   Lipid Panel     Component Value Date/Time   CHOL 257* 08/05/2012 0430   TRIG 296* 08/05/2012 0430   HDL 32* 08/05/2012 0430   CHOLHDL 8.0 08/05/2012 0430   VLDL 59* 08/05/2012 0430   LDLCALC 166* 08/05/2012 0430    Cardiac Panel (last 3 results)  Basename 08/06/12 0505 08/05/12 2248 08/05/12 1628 08/05/12 0920  CKTOTAL -- -- -- 174  CKMB -- -- -- 19.3*  TROPONINI 0.86* 1.06* 1.47* --  RELINDX -- -- -- 11.1*   Study Conclusions  - Left ventricle: The cavity size was normal. Wall thickness was normal. Systolic function was normal. The estimated ejection fraction was in the range of 55% to 60%. Possible mild hypokinesis of the basalinferior myocardium. Left ventricular diastolic function parameters were normal. - Right atrium: The atrium was mildly dilated. - Pulmonary arteries: Systolic pressure was mildly increased. PA peak pressure: 70mm Hg (S).    Assessment/Plan  Principal Problem:  *STEMI (ST elevation myocardial infarction): Inferior, s/p Promus DES to RCA 08/04/12-emergently Active Problems:  HTN (hypertension)  Tobacco abuse  History of CVA (cerebrovascular accident):  Apprx four years ago.  Panic attack, History of..  CAD (coronary artery disease),residual non obstructive disease  Hyperlipidemia  DM (diabetes mellitus), type 2 new diagnosis  Plan: S/P inf STEMI, PCI/Stent with DES.   Three episodes of 3.0-3.5 sec pauses on tele during sleep.  Holding BB this AM.  Possibly related to OSA.  Needs sleep study as OP.   Adding Lovaza for hypertriglyceridemia.   New DM2.  Will add metformin as outpatient.  Lifestyle modifications discussed.   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  33mmHg.  EF 55-60%.  Possible mild hypokinesis of the basalinferior myocardium.  Bp needs better control:  Amlodipine 5,  Lisinopril 20 mg bid,  Toprol XL 100mg .   Will increase  amlodipine to 10mg .  Decrease Toprol to 50mg .  May need hydralazine.     LOS: 2 days    Bobby [name] 08/06/2012 8:17 AM

## 2012-08-07 DIAGNOSIS — E669 Obesity, unspecified: Secondary | ICD-10-CM | POA: Diagnosis present

## 2012-08-07 DIAGNOSIS — I4510 Unspecified right bundle-branch block: Secondary | ICD-10-CM | POA: Diagnosis present

## 2012-08-07 LAB — GLUCOSE, CAPILLARY
Glucose-Capillary: 134 mg/dL — ABNORMAL HIGH (ref 70–99)
Glucose-Capillary: 104 mg/dL — ABNORMAL HIGH (ref 70–99)

## 2012-08-07 MED ORDER — LISINOPRIL 20 MG PO TABS: 20.0000 mg | ORAL_TABLET | Freq: Two times a day (BID) | ORAL | Status: DC

## 2012-08-07 MED ORDER — TICAGRELOR 90 MG PO TABS: 90.0000 mg | ORAL_TABLET | Freq: Two times a day (BID) | ORAL | Status: DC

## 2012-08-07 MED ORDER — METOPROLOL SUCCINATE ER 50 MG PO TB24: 50.0000 mg | ORAL_TABLET | Freq: Every day | ORAL | Status: DC

## 2012-08-07 MED ORDER — ATORVASTATIN CALCIUM 80 MG PO TABS: 80.0000 mg | ORAL_TABLET | Freq: Every day | ORAL | Status: DC

## 2012-08-07 MED ORDER — ACETAMINOPHEN 325 MG PO TABS: 650.0000 mg | ORAL_TABLET | ORAL | Status: DC | PRN

## 2012-08-07 MED ORDER — ASPIRIN 81 MG PO TBEC: 81.0000 mg | DELAYED_RELEASE_TABLET | Freq: Every day | ORAL | Status: DC

## 2012-08-07 MED ORDER — AMLODIPINE BESYLATE 10 MG PO TABS: 10.0000 mg | ORAL_TABLET | Freq: Every day | ORAL | Status: DC

## 2012-08-07 MED ORDER — OMEPRAZOLE 20 MG PO CPDR: 20.0000 mg | DELAYED_RELEASE_CAPSULE | Freq: Every day | ORAL | Status: DC

## 2012-08-07 MED ORDER — NITROGLYCERIN 0.4 MG SL SUBL: 0.4000 mg | SUBLINGUAL_TABLET | SUBLINGUAL | Status: DC | PRN

## 2012-08-07 MED ORDER — NICOTINE 14 MG/24HR TD PT24: 30.0000 | MEDICATED_PATCH | Freq: Every day | TRANSDERMAL | Status: DC

## 2012-08-07 MED ORDER — OMEGA-3 FATTY ACIDS 1000 MG PO CAPS: 2.0000 g | ORAL_CAPSULE | Freq: Every day | ORAL | Status: DC

## 2012-08-07 NOTE — Progress Notes (Signed)
Subjective:  No chest pain  Objective:  Vital Signs in the last 24 hours: Temp:  [97.5 F (36.4 C)-98 F (36.7 C)] 98 F (36.7 C) (12/05 0400) Pulse Rate:  [71-87] 87  (12/04 1608) Resp:  [16-18] 16  (12/04 2000) BP: (127-167)/(61-82) 145/61 mmHg (12/05 0418) SpO2:  [93 %-96 %] 93 % (12/05 0418)  Intake/Output from previous day:  Intake/Output Summary (Last 24 hours) at 08/07/12 0817 Last data filed at 08/06/12 1700  Gross per 24 hour  Intake    670 ml  Output      2 ml  Net    668 ml      . sodium chloride   Intravenous Once  . amLODipine  10 mg Oral Daily  . aspirin EC  81 mg Oral Daily  . atorvastatin  80 mg Oral q1800  . citalopram  10 mg Oral Daily  . insulin aspart  0-9 Units Subcutaneous TID WC  . lisinopril  20 mg Oral BID  . [COMPLETED] living well with diabetes book   Does not apply Once  . metoprolol succinate  50 mg Oral Daily  . nicotine  14 mg Transdermal Daily  . omega-3 acid ethyl esters  1 g Oral BID  . pantoprazole  40 mg Oral Daily  . Ticagrelor  90 mg Oral BID  . tiotropium  18 mcg Inhalation Daily  . [DISCONTINUED] amLODipine  5 mg Oral Daily  . [DISCONTINUED] bivalirudin  0.1 mg/kg Intravenous Once  . [DISCONTINUED] metoprolol succinate  100 mg Oral Daily   Physical Exam: General appearance: alert, cooperative and no distress Lungs: decreased breath sounds Heart: regular rate and rhythm Abd: soft, BS+ No edema   Rate: 85  Rhythm: normal sinus rhythm and PVCs  Lab Results:  Basename 08/06/12 0505 08/05/12 0430  WBC 10.8* 10.7*  HGB 14.5 14.3  PLT 190 217    Basename 08/06/12 0505 08/05/12 0430  NA 137 139  K 3.8 4.3  CL 102 103  CO2 25 25  GLUCOSE 111* 110*  BUN 16 14  CREATININE 1.11 0.95    Basename 08/06/12 0505 08/05/12 2248  TROPONINI 0.86* 1.06*   Hepatic Function Panel No results found for this basename: PROT,ALBUMIN,AST,ALT,ALKPHOS,BILITOT,BILIDIR,IBILI in the last 72 hours  Basename 08/05/12 0430  CHOL 257*    Lipid Panel     Component Value Date/Time   CHOL 257* 08/05/2012 0430   TRIG 296* 08/05/2012 0430   HDL 32* 08/05/2012 0430   CHOLHDL 8.0 08/05/2012 0430   VLDL 59* 08/05/2012 0430   LDLCALC 166* 08/05/2012 0430     No results found for this basename: INR in the last 72 hours  Imaging: Imaging results have been reviewed  Cardiac Studies:  Assessment/Plan:   Principal Problem:  *STEMI: Inferior, s/p Promus DES to RCA 08/04/12-emergently Active Problems:  CAD ,residual 40-50% LAD  DM (diabetes mellitus), type 2 new diagnosis  COPD (chronic obstructive pulmonary disease)  HTN (hypertension)  Tobacco abuse  History of CVA (cerebrovascular accident):  Apprx four years ago.  Panic attack, History of..  Hyperlipidemia  RBBB  Chronic back pain, on disability  Obesity (BMI 30-39.9)  Plan-Will discuss with MD-? Discharge today. Troponin peak 1.4.    Kerin Ransom PA-C 08/07/2012, 8:17 AM   Patient seen and examined. Agree with assessment and plan. No recurrent symptoms. Day 3 s/p STEMI. Minimal troponin elevation.  Suspect good LV function long term. Will dc today.  Now on high dose statin, but may need combination  therapy as outpatient with atherogenic dyslipidemic panel. F/U in office in 2 weeks.   Troy Sine, MD, [facility] 08/07/2012 8:40 AM

## 2012-08-07 NOTE — Discharge Summary (Signed)
Patient ID: Bobby [name],  MRN: [id number], DOB/AGE: 1946-08-22 66 y.o.  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  Panic attack, History of..  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 Presented to the ER 08/04/12 as a STEMI with RBBB and inferior ST elevation. He was taken to the cath lab by Dr Claiborne Billings and underwent urgent RCA PCI. He tolerated the procedure well. He does have moderate residual CAD in the LAD. His LVF is normal and his Troponin only went to 1.4. We feel he can be discharge 08/08/12. He will follow up with Dr Claiborne Billings in a couple of weeks. He has been counseled on smoking cessation.   Discharge Vitals:  Blood pressure 130/56, pulse 94, temperature 97.7 F (36.5 C), temperature source Oral, resp. rate 16, height 5\' 9"  (1.753 m), weight 109.6 kg (241 lb 10 oz), SpO2 96.00%.    Labs: Results for orders placed during the hospital encounter of 08/04/12 (from the past 48 hour(s))  TROPONIN I     Status: Abnormal   Collection Time   08/05/12  4:28 PM      Component Value Range Comment   Troponin I 1.47 (*) <0.30 ng/mL   GLUCOSE, CAPILLARY     Status: Normal   Collection Time   08/05/12  4:45 PM      Component Value Range Comment   Glucose-Capillary 86  70 - 99 mg/dL   GLUCOSE, CAPILLARY     Status: Abnormal   Collection Time   08/05/12  9:40 PM     Component Value Range Comment   Glucose-Capillary 243 (*) 70 - 99 mg/dL    Comment 1 Documented in Chart      Comment 2 Notify RN     TROPONIN I     Status: Abnormal   Collection Time   08/05/12 10:48 PM      Component Value Range Comment   Troponin I 1.06 (*) <0.30 ng/mL   BASIC METABOLIC PANEL     Status: Abnormal   Collection Time   08/06/12  5:05 AM      Component Value Range Comment   Sodium 137  135 - 145 mEq/L    Potassium 3.8  3.5 - 5.1 mEq/L    Chloride 102  96 - 112 mEq/L    CO2 25  19 - 32 mEq/L    Glucose, Bld 111 (*) 70 - 99 mg/dL    BUN 16  6 - 23 mg/dL    Creatinine, Ser 1.11  0.50 - 1.35 mg/dL    Calcium 9.4  8.4 - 10.5 mg/dL    GFR calc non Af Amer 67 (*) >90 mL/min    GFR calc Af Amer 78 (*) >90 mL/min   CBC     Status: Abnormal   Collection Time   08/06/12  5:05 AM      Component Value Range Comment   WBC 10.8 (*) 4.0 -  10.5 K/uL    RBC 5.05  4.22 - 5.81 MIL/uL    Hemoglobin 14.5  13.0 - 17.0 g/dL    HCT 43.9  39.0 - 52.0 %    MCV 86.9  78.0 - 100.0 fL    MCH 28.7  26.0 - 34.0 pg    MCHC 33.0  30.0 - 36.0 g/dL    RDW 13.7  11.5 - 15.5 %    Platelets 190  150 - 400 K/uL   TROPONIN I     Status: Abnormal   Collection Time   08/06/12  5:05 AM      Component Value Range Comment   Troponin I 0.86 (*) <0.30 ng/mL   GLUCOSE, CAPILLARY     Status: Abnormal   Collection Time   08/06/12  7:45 AM      Component Value Range Comment   Glucose-Capillary 115 (*) 70 - 99 mg/dL   GLUCOSE, CAPILLARY     Status: Abnormal   Collection Time   08/06/12 11:40 AM      Component Value Range Comment   Glucose-Capillary 108 (*) 70 - 99 mg/dL   GLUCOSE, CAPILLARY     Status: Abnormal   Collection Time   08/06/12  4:24 PM      Component Value Range Comment   Glucose-Capillary 138 (*) 70 - 99 mg/dL   GLUCOSE, CAPILLARY     Status: Abnormal   Collection Time   08/06/12  9:41 PM      Component Value Range Comment   Glucose-Capillary 113 (*) 70 - 99 mg/dL   GLUCOSE, CAPILLARY      Status: Abnormal   Collection Time   08/07/12  8:25 AM      Component Value Range Comment   Glucose-Capillary 134 (*) 70 - 99 mg/dL     Disposition:    Discharge Medications:    Medication List     As of 08/07/2012 10:34 AM    ASK your doctor about these medications         albuterol (2.5 MG/3ML) 0.083% nebulizer solution   Commonly known as: PROVENTIL   Take 5 mg by nebulization every 4 (four) hours as needed. For wheezing or SOB      amLODipine 5 MG tablet   Commonly known as: NORVASC   Take 5 mg by mouth daily.      citalopram 10 MG tablet   Commonly known as: CELEXA   Take 10 mg by mouth daily.      clonazePAM 1 MG tablet   Commonly known as: KLONOPIN   Take 1 mg by mouth 2 (two) times daily as needed. For anxiety      hydrochlorothiazide 12.5 MG capsule   Commonly known as: MICROZIDE   Take 25 mg by mouth daily.      lisinopril 40 MG tablet   Commonly known as: PRINIVIL,ZESTRIL   Take 40 mg by mouth daily.      metoprolol succinate 100 MG 24 hr tablet   Commonly known as: TOPROL-XL   Take 100 mg by mouth daily. Take with or immediately following a meal.      oxyCODONE-acetaminophen 10-325 MG per tablet   Commonly known as: PERCOCET   Take 1 tablet by mouth every 8 (eight) hours as needed.      tiotropium 18 MCG inhalation capsule   Commonly known as: SPIRIVA   Place 18 mcg into inhaler and inhale daily.         Duration of Discharge  Encounter: Greater than 30 minutes including physician time.  Angelena Form PA-C 08/07/2012 10:34 AM

## 2012-08-07 NOTE — Progress Notes (Signed)
Chaplain responded to 7:22pm page from ED for Code STEMI. Chaplain met pt's mom and her pastor in ED waiting area and brought them to family room B. Other family members arrived shortly thereafter. When pt was taken to cath lab I took family to consult room outside cath lab, and nurse gave them a briefing about what would be happening. I returned at 9:15 and checked in cath lab for status report. Procedure had just concluded and I informed family that Dr. Claiborne Billings would be out to give them a report within 15 minutes. Family was very appreciative of chaplain's assistance.

## 2012-08-07 NOTE — Progress Notes (Signed)
Pt walking independently. All questions answered. For D/C. Yves Dill CES, aCSM

## 2012-10-29 ENCOUNTER — Ambulatory Visit (HOSPITAL_BASED_OUTPATIENT_CLINIC_OR_DEPARTMENT_OTHER): Payer: Medicare Other | Attending: Cardiovascular Disease | Admitting: Radiology

## 2012-10-29 VITALS — Ht 69.0 in | Wt 246.0 lb

## 2012-10-29 DIAGNOSIS — I498 Other specified cardiac arrhythmias: Secondary | ICD-10-CM | POA: Insufficient documentation

## 2012-10-29 DIAGNOSIS — G4733 Obstructive sleep apnea (adult) (pediatric): Secondary | ICD-10-CM

## 2012-10-29 DIAGNOSIS — R0683 Snoring: Secondary | ICD-10-CM

## 2012-11-01 DIAGNOSIS — I491 Atrial premature depolarization: Secondary | ICD-10-CM

## 2012-11-01 DIAGNOSIS — I498 Other specified cardiac arrhythmias: Secondary | ICD-10-CM

## 2012-11-01 DIAGNOSIS — G4733 Obstructive sleep apnea (adult) (pediatric): Secondary | ICD-10-CM

## 2012-11-01 NOTE — Procedures (Signed)
NAME:  Bobby [name], Bobby [name]                ACCOUNT NO.:  [id number]  MEDICAL RECORD NO.:  [id number]          PATIENT TYPE:  OUT  LOCATION:  SLEEP CENTER                 FACILITY:  [facility]  PHYSICIAN:  Damiyah Ditmars D. Annamaria Boots, MD, FCCP, FACPDATE OF BIRTH:  09/05/1945  DATE OF STUDY:  10/29/2012                           NOCTURNAL POLYSOMNOGRAM  REFERRING PHYSICIAN:  Shelva Majestic, M.D.  INDICATION FOR STUDY:  Hypersomnia with sleep apnea.  EPWORTH SLEEPINESS SCORE:  6/24.  BMI 36.3, weight 246 pounds.  Height 69 inches.  Neck 17.5 inches.  MEDICATIONS:  Home medications are charted and reviewed.  SLEEP ARCHITECTURE:  Total sleep time 347 minutes with sleep efficiency 81.3%.  Stage I 28.8%, stage II 67.9%, stage III absent, REM 10.4% of total sleep time.  Sleep latency 7.5 minutes, REM latency 147 minutes. Awake after sleep onset 73 minutes.  Arousal index 15.6.  Bedtime medication:  Klonopin, Percocet.  RESPIRATORY DATA:  Apnea-hypopnea index (AHI) 6.4 per hour.  A total of 37 events was scored including 5 obstructive apneas, 3 central apneas, 29 hypopneas.  All events were associated with non-supine sleep position and REM.  REM AHI 53.3 per hour.  There were not enough early events to qualify for split protocol CPAP titration on the study.  OXYGEN DATA:  Mild-to-moderate snoring with oxygen desaturation to a nadir of 85% and mean oxygen saturation through the study of 90.2% on room air.  CARDIAC DATA:  Sinus rhythm/sinus bradycardia.  Heart rate was noted to flow into the 30s during REM.  Occasional PAC.  MOVEMENT/PARASOMNIA:  No significant movement disturbance. Bathroom x2.  IMPRESSION/RECOMMENDATION: 1. Mild obstructive sleep apnea/hypopnea syndrome, AHI 6.4 per hour     with non-supine events.  Mild-to-moderate snoring with oxygen     desaturation to a nadir of 85% on room air.  There were     insufficient numbers of events to permit application of split     protocol CPAP titration on  the study night. 2. Room air oxygen saturation on arrival was 93%.  He desaturated to     85% during the study with a mean oxygen saturation on room air     through the study of 90.2%.  He spent 104.5 minutes with room air     saturation less than     90% and 7.9 minutes with saturation less than 88%. 3. Cardiac rhythm was significant for occasional PAC and for sinus     bradycardia to 30 per minute during REM.     Jennie Bolar D. Annamaria Boots, MD, [facility], [place], [place] Board of Sleep Medicine    CDY/MEDQ  D:  11/01/2012 15:27:23  T:  11/01/2012 23:49:31  Job:  [id number]

## 2012-11-18 ENCOUNTER — Encounter: Payer: Self-pay | Admitting: *Deleted

## 2012-11-26 ENCOUNTER — Encounter: Payer: Self-pay | Admitting: Cardiovascular Disease

## 2012-11-28 ENCOUNTER — Encounter: Payer: Self-pay | Admitting: Cardiovascular Disease

## 2013-01-20 ENCOUNTER — Telehealth: Payer: Self-pay | Admitting: Cardiovascular Disease

## 2013-01-23 ENCOUNTER — Encounter: Payer: Self-pay | Admitting: Cardiovascular Disease

## 2013-01-23 ENCOUNTER — Ambulatory Visit (INDEPENDENT_AMBULATORY_CARE_PROVIDER_SITE_OTHER): Payer: Medicare Other | Admitting: Cardiovascular Disease

## 2013-01-23 VITALS — BP 116/50 | HR 90 | Ht 69.0 in | Wt 237.0 lb

## 2013-01-23 DIAGNOSIS — I2510 Atherosclerotic heart disease of native coronary artery without angina pectoris: Secondary | ICD-10-CM

## 2013-01-23 DIAGNOSIS — I4949 Other premature depolarization: Secondary | ICD-10-CM

## 2013-01-23 DIAGNOSIS — I119 Hypertensive heart disease without heart failure: Secondary | ICD-10-CM

## 2013-01-23 DIAGNOSIS — R0602 Shortness of breath: Secondary | ICD-10-CM

## 2013-01-23 DIAGNOSIS — I493 Ventricular premature depolarization: Secondary | ICD-10-CM

## 2013-01-23 DIAGNOSIS — X088XXA Exposure to other specified smoke, fire and flames, initial encounter: Secondary | ICD-10-CM | POA: Insufficient documentation

## 2013-01-23 MED ORDER — ATORVASTATIN CALCIUM 40 MG PO TABS: 40.0000 mg | ORAL_TABLET | Freq: Every day | ORAL | Status: DC

## 2013-01-23 NOTE — Progress Notes (Signed)
Patient ID: Bobby [name], male   DOB: 21-Feb-1946, 67 y.o.   MRN: [id number]  HPI: Bobby [name], is a 67 y.o. male presents to the office today for three-month followup evaluation. Bobby [name] is a 67 year old gentleman who has a long-standing tobacco history having started smoking at age 60, history of hypertension, remote small TIA/CVA 4 years ago who presented to the hospital on 08/04/2012 in the setting of inferior ST segment elevation myocardial infarction. Catheterization by me revealed a 99% stenosis of the right coronary artery. He did have concomitant CAD involving his LAD and circumflex vessels. He underwent acute percutaneous coronary intervention with an excellent door to balloon time of only 20 minutes and ultimately insertion of a 3.25x32 mm progress DES stent post dilated 3.3. An echo done in the hospital showed an EF of 50-60% with mild basal inferior probable scar and moderate pulmonary hypertension with PA pressure of 45 mm. I last saw him in February 2014. At that time, I reduced his lisinopril dose from 40 mg 20 mg and also reduce his HCTZ to 12.5 mg.  Presently, Bobby [name] does note shortness of breath with activity. He denies definitive chest pain apparently, he recently had some of his medications renewed but he had noticed that for some reason he was no longer had the prescription for Lipitor which he had been taking 40 mg.  Presently he denies any awareness of tachycardia palpitations current he denies presyncope or syncope.  Past Medical History  Diagnosis Date  . Stroke   . Hypertension   . Panic attack   . CAD (coronary artery disease),residual non obstructive disease 08/05/2012  . Hyperlipidemia 08/05/2012  . S/P coronary artery stent placement, to RCA Promus DES 08/05/2012  . DM (diabetes mellitus), type 2 new diagnosis 08/05/2012  . Tobacco abuse   . MI (myocardial infarction) 08/04/2012    acute inferior stemi secondary to RCA occlusion; PCI  . Pulmonary HTN     echo  08/05/12, EF 55-60%, PA pressure 63mm    Past Surgical History  Procedure Laterality Date  . Cardiac catheterization  08/04/12    PCI to RCA with DES    No Known Allergies  Current Outpatient Prescriptions  Medication Sig Dispense Refill  . albuterol (PROVENTIL) (2.5 MG/3ML) 0.083% nebulizer solution Take 5 mg by nebulization every 4 (four) hours as needed. For wheezing or SOB      . amLODipine (NORVASC) 10 MG tablet Take 1 tablet (10 mg total) by mouth daily.  30 tablet  5  . aspirin EC 81 MG EC tablet Take 1 tablet (81 mg total) by mouth daily.      . clonazePAM (KLONOPIN) 1 MG tablet Take 1 mg by mouth 2 (two) times daily as needed. For anxiety      . fish oil-omega-3 fatty acids 1000 MG capsule Take 2 capsules (2 g total) by mouth daily.      . hydrochlorothiazide (MICROZIDE) 12.5 MG capsule Take 12.5 mg by mouth daily.       [name] lisinopril (PRINIVIL,ZESTRIL) 20 MG tablet Take 1 tablet (20 mg total) by mouth 2 (two) times daily.  60 tablet  5  . metoprolol succinate (TOPROL-XL) 50 MG 24 hr tablet Take 50 mg by mouth daily. Take 1 & 1/2 tablet daily (75 mg)      . nitroGLYCERIN (NITROSTAT) 0.4 MG SL tablet Place 1 tablet (0.4 mg total) under the tongue every 5 (five) minutes x 3 doses as needed for chest pain.  25 tablet  2  . omeprazole (PRILOSEC) 20 MG capsule Take 1 capsule (20 mg total) by mouth daily.      [name] oxyCODONE-acetaminophen (PERCOCET) 10-325 MG per tablet Take 1 tablet by mouth every 8 (eight) hours as needed.      . Ticagrelor (BRILINTA) 90 MG TABS tablet Take 1 tablet (90 mg total) by mouth 2 (two) times daily.  60 tablet  11  . tiotropium (SPIRIVA) 18 MCG inhalation capsule Place 18 mcg into inhaler and inhale daily.      [name] atorvastatin (LIPITOR) 40 MG tablet Take 1 tablet (40 mg total) by mouth daily.  30 tablet  11   No current facility-administered medications for this visit.    Social history is no and that he is divorced. He doesn't walk but not routinely exercise.  He is significantly reduced tobacco.  ROVis negative for fever chills or night sweats. He is unaware of any blackout spells. He does note shortness of breath particularly with walking. He denies chest pressure he denies bleeding. He denies change in bowel or bladder habits. He denies change in urine output there is no significant swelling scar dose of HCTZ. He denies rash. Denies myalgias. Other system review is negative.  PE BP 116/50  Pulse 90  Ht 5\' 9"  (1.753 m)  Wt 237 lb (107.502 kg)  BMI 34.98 kg/m2  General: Alert, oriented, no distress.  HEENT: Normocephalic, atraumatic. Pupils round and reactive; sclera anicteric;  Nose without nasal septal hypertrophy Mouth/Parynx benign; Mallinpatti scale 3 Neck: No JVD, no carotid briuts Lungs: decreased BS diffusely without wheezing;  Heart: RRR, s1 s2 normal 1/6 sem Abdomen: soft, nontender; no hepatosplenomehaly, BS+; abdominal aorta nontender and not dilated by palpation. Pulses 2+ Extremities: no clubbinbg cyanosis or edema, Homan's sign negative  Neurologic: grossly nonfocal   ECG: sinus rhythm at 90 beats per minute with frequent unifocal PVCs a right bundle-branch block with repolarization changes. PR interval 138 ms, QTC 494 ms.   LABS:  BMET    Component Value Date/Time   NA 137 08/06/2012 0505   K 3.8 08/06/2012 0505   CL 102 08/06/2012 0505   CO2 25 08/06/2012 0505   GLUCOSE 111* 08/06/2012 0505   BUN 16 08/06/2012 0505   CREATININE 1.11 08/06/2012 0505   CALCIUM 9.4 08/06/2012 0505   GFRNONAA 67* 08/06/2012 0505   GFRAA 78* 08/06/2012 0505     Hepatic Function Panel  No results found for this basename: prot, albumin, ast, alt, alkphos, bilitot, bilidir, ibili     CBC    Component Value Date/Time   WBC 10.8* 08/06/2012 0505   RBC 5.05 08/06/2012 0505   HGB 14.5 08/06/2012 0505   HCT 43.9 08/06/2012 0505   PLT 190 08/06/2012 0505   MCV 86.9 08/06/2012 0505   MCH 28.7 08/06/2012 0505   MCHC 33.0 08/06/2012 0505   RDW  13.7 08/06/2012 0505   LYMPHSABS 4.3* 10/22/2010 0045   MONOABS 1.3* 10/22/2010 0045   EOSABS 0.2 10/22/2010 0045   BASOSABS 0.1 10/22/2010 0045     BNP No results found for this basename: probnp    Lipid Panel     Component Value Date/Time   CHOL 257* 08/05/2012 0430   TRIG 296* 08/05/2012 0430   HDL 32* 08/05/2012 0430   CHOLHDL 8.0 08/05/2012 0430   VLDL 59* 08/05/2012 0430   LDLCALC 166* 08/05/2012 0430     RADIOLOGY: No results found.    ASSESSMENT AND PLAN: Mr. Polacek is now  6 months following his acute coronary syndrome when he presented with subtotal occlusion of his right coronary artery. He had diffuse disease beyond the subtotal occlusion and ultimately had successful insertion of a 3.25x32 mm PROMUS DES stent. He did have concomitant CAD with 40-50% proximal LAD narrowing, 30% circumflex marginal stenoses. He does notice more shortness of breath with activity than previously. He is had an NMR lipoprofile after initiation of atorvastatin January 2014. This showed marked improvement in total cholesterol of 119 LDL 48 LDL particle #992. HDL was very low at 30 with an increased triglyceride at 203 and significantly reduced HDL particle #24.2. His insulin resistance score was elevated at 65. Presently, I am resuming atorvastatin 40 mg currently he has been off this for some time. I'm also scheduling him for a la is a a a a is is a the floor and is in a A the a a rate of the a is enlarged and health is aexiscan Myoview study to further evaluate his shortness of breath and make certain he is not having late restenosis following his MRI to make certain there is no significant ischemia in other vascular territories. I am further titrating his beta blocker to increase his Toprol to 75 mg. He is currently only been taking fish oil one capsule daily and will increase this to at least 2 per day. I'll see him back in the office in 6 weeks for followup evaluation of his studies.      Troy Sine, MD, [facility]  01/23/2013 6:09 PM

## 2013-01-23 NOTE — Patient Instructions (Signed)
Your physician has requested that you have an [place] scan within the  Next 2-3- weeks For further information please visit [email or web address]. Please follow instruction sheet, as given.   Your physician has recommended you make the following change in your medication: Restart your atorvastatin @ 40 mg. Daily. This medication will be sent in to your phamacy. Increase your fish oil up  to two capsules daily.  Your physician recommends that you schedule a follow-up appointment in: 6 weeks.

## 2013-02-04 ENCOUNTER — Telehealth: Payer: Self-pay | Admitting: Cardiovascular Disease

## 2013-02-04 NOTE — Telephone Encounter (Signed)
Returned call.  Left message to fax form back.  Was not received.  Paper [phone] on triage cart.

## 2013-02-04 NOTE — Telephone Encounter (Signed)
Message forwarded to W. Waddell, CMA.  

## 2013-02-04 NOTE — Telephone Encounter (Signed)
Faxed surgical clearance back. Per Dr. Claiborne Billings patient cannot D/C Brilinta or ASA. Recommend defer surgery at least until December 2014.

## 2013-02-04 NOTE — Telephone Encounter (Signed)
Per Pam at Dr. [name] she faxed over a form for cardiac clearance to [phone] attention Mariann Laster on May 19th and is calling to see check on the status of that form . Please call her back at [phone] ext [phone]   Thanks

## 2013-02-25 ENCOUNTER — Ambulatory Visit: Payer: Medicare Other | Admitting: Pharmacist Clinician (PhC)/ Clinical Pharmacy Specialist

## 2013-02-25 ENCOUNTER — Encounter (HOSPITAL_COMMUNITY): Payer: Medicare Other

## 2013-02-26 ENCOUNTER — Ambulatory Visit (HOSPITAL_COMMUNITY)
Admission: RE | Admit: 2013-02-26 | Discharge: 2013-02-26 | Disposition: A | Discharge status: Home / Self Care | Payer: Medicare Other | Source: Ambulatory Visit | Attending: Cardiovascular Disease | Admitting: Cardiovascular Disease

## 2013-02-26 ENCOUNTER — Encounter (HOSPITAL_COMMUNITY): Payer: Medicare Other

## 2013-02-26 DIAGNOSIS — I2510 Atherosclerotic heart disease of native coronary artery without angina pectoris: Secondary | ICD-10-CM

## 2013-02-26 DIAGNOSIS — E669 Obesity, unspecified: Secondary | ICD-10-CM | POA: Insufficient documentation

## 2013-02-26 DIAGNOSIS — R0989 Other specified symptoms and signs involving the circulatory and respiratory systems: Secondary | ICD-10-CM | POA: Insufficient documentation

## 2013-02-26 DIAGNOSIS — Z8673 Personal history of transient ischemic attack (TIA), and cerebral infarction without residual deficits: Secondary | ICD-10-CM | POA: Insufficient documentation

## 2013-02-26 DIAGNOSIS — R42 Dizziness and giddiness: Secondary | ICD-10-CM | POA: Insufficient documentation

## 2013-02-26 DIAGNOSIS — R0609 Other forms of dyspnea: Secondary | ICD-10-CM | POA: Insufficient documentation

## 2013-02-26 DIAGNOSIS — R079 Chest pain, unspecified: Secondary | ICD-10-CM | POA: Insufficient documentation

## 2013-02-26 DIAGNOSIS — I119 Hypertensive heart disease without heart failure: Secondary | ICD-10-CM

## 2013-02-26 DIAGNOSIS — R5381 Other malaise: Secondary | ICD-10-CM | POA: Insufficient documentation

## 2013-02-26 DIAGNOSIS — F17200 Nicotine dependence, unspecified, uncomplicated: Secondary | ICD-10-CM | POA: Insufficient documentation

## 2013-02-26 MED ORDER — TECHNETIUM TC 99M SESTAMIBI GENERIC - CARDIOLITE
31.7000 | Freq: Once | INTRAVENOUS | Status: AC | PRN
  Administered 2013-02-26: 31.7 via INTRAVENOUS

## 2013-02-26 MED ORDER — REGADENOSON 0.4 MG/5ML IV SOLN
0.4000 mg | Freq: Once | INTRAVENOUS | Status: AC
  Administered 2013-02-26: 0.4 mg via INTRAVENOUS

## 2013-02-26 MED ORDER — TECHNETIUM TC 99M SESTAMIBI GENERIC - CARDIOLITE
10.6000 | Freq: Once | INTRAVENOUS | Status: AC | PRN
  Administered 2013-02-26: 11 via INTRAVENOUS

## 2013-02-26 MED ORDER — AMINOPHYLLINE 25 MG/ML IV SOLN
75.0000 mg | Freq: Once | INTRAVENOUS | Status: AC
  Administered 2013-02-26: 75 mg via INTRAVENOUS

## 2013-02-26 NOTE — Procedures (Addendum)
[facility] [address] [place] [address] [id number]  Cardiology Nuclear Med Study  Bobby [name] is a 67 y.o. male     MRN : [id number]     DOB: 02/05/1946  Procedure Date: 02/26/2013  Nuclear Med Background Indication for Stress Test:  Stent Patency and Abnormal EKG History:  COPD and CAD;MI--08/04/2012;STENT/PTCA--08/04/2012  Cardiac Risk Factors: CVA, Family History - CAD, Hypertension, Lipids, NIDDM, Obesity, Smoker and TIA  Symptoms:  Chest Pain, Dizziness, DOE, Fatigue, Light-Headedness and SOB   Nuclear Pre-Procedure Caffeine/Decaff Intake:  1:00am NPO After: 11AM   IV Site: R Hand  IV 0.9% NS with Angio Cath:  22g  Chest Size (in):  46"  IV Started by: Azucena Cecil, RN  Height: 5\' 9"  (1.753 m)  Cup Size: n/a  BMI:  Body mass index is 34.98 kg/(m^2). Weight:  237 lb (107.502 kg)   Tech Comments:  N/A    Nuclear Med Study 1 or 2 day study: 1 day  Stress Test Type:  [place]  Order Authorizing Provider:  Shelva Majestic, MD   Resting Radionuclide: Technetium 54m Sestamibi  Resting Radionuclide Dose: 10.6 mCi   Stress Radionuclide:  Technetium 61m Sestamibi  Stress Radionuclide Dose: 31.7 mCi           Stress Protocol Rest HR: 66 Stress HR: 76  Rest BP: 137/98 Stress BP: 122/71  Exercise Time (min): n/a METS: n/a   Predicted Max HR: 153 bpm % Max HR: 49.67 bpm Rate Pressure Product: 10868  Dose of Adenosine (mg):  n/a Dose of Lexiscan: 0.4 mg  Dose of Atropine (mg): n/a Dose of Dobutamine: n/a mcg/kg/min (at max HR)  Stress Test Technologist: Leane Para, CCT Nuclear Technologist: Imagene Riches, CNMT   Rest Procedure:  Myocardial perfusion imaging was performed at rest 45 minutes following the intravenous administration of Technetium 66m Sestamibi. Stress Procedure:  The patient received IV Lexiscan 0.4 mg over 15-seconds.  Technetium 34m Sestamibi injected at 30-seconds.  The patient  experienced marked SOB; 75 mg of IV Aminophylline was administered with resolution.  There were no significant changes with Lexiscan.  Quantitative spect images were obtained after a 45 minute delay.  Transient Ischemic Dilatation (Normal <1.22):  1.0 Lung/Heart Ratio (Normal <0.45):  0.32 QGS EDV:  87 ml QGS ESV:  30 ml LV Ejection Fraction: 66%  Rest ECG: NSR-RBBB  Stress ECG: No significant change from baseline ECG  QPS Raw Data Images:  Normal; no motion artifact; normal heart/lung ratio. Stress Images:  Inferoseptal bowel artifact Rest Images:  Inferoseptal bowel artifact Subtraction (SDS):  No evidence of ischemia.  Impression Exercise Capacity:  Lexiscan with no exercise. BP Response:  Normal blood pressure response. Clinical Symptoms:  No significant symptoms noted. ECG Impression:  No significant ST segment change suggestive of ischemia. Comparison with Prior Nuclear Study: No previous nuclear study performed  Overall Impression:  Low risk stress nuclear study with fixed inferoseptal bowel artifact and possible small area of inferobasal scar.. LV Wall Motion:  NL LV Function; NL Wall Motion; EF 66%.  Pixie Casino, MD, [facility] Board Certified in Nuclear Cardiology Attending Cardiologist The [place], MD  02/26/2013 6:27 PM

## 2013-03-09 ENCOUNTER — Telehealth: Payer: Self-pay | Admitting: Cardiovascular Disease

## 2013-03-09 NOTE — Telephone Encounter (Signed)
Message forwarded to W. Waddell, CMA.  

## 2013-03-09 NOTE — Telephone Encounter (Signed)
Pt called wanting to know what his results were of his stress test.

## 2013-03-09 NOTE — Telephone Encounter (Signed)
Would like results of Stress test ..    Thanks

## 2013-03-11 NOTE — Telephone Encounter (Signed)
Results not reviewed by MD yet. Note forwarded to Dr. Claiborne Billings to review results and let me know how to inform  the patient.

## 2013-03-11 NOTE — Telephone Encounter (Signed)
Still waiting to get stress test results!

## 2013-03-13 NOTE — Telephone Encounter (Signed)
Pt is also wanting to the results on his sleep Apnea test which was done in . Informed patient that you are waiting for the MD to review the results and you will call once he has reviewed .[name] If no answer at the the other number please call this number [phone]  Thanks

## 2013-03-24 ENCOUNTER — Encounter: Payer: Self-pay | Admitting: *Deleted

## 2013-03-27 ENCOUNTER — Encounter: Payer: Self-pay | Admitting: Cardiovascular Disease

## 2013-03-27 ENCOUNTER — Ambulatory Visit: Payer: Medicare Other | Admitting: Cardiovascular Disease

## 2013-03-27 ENCOUNTER — Ambulatory Visit (INDEPENDENT_AMBULATORY_CARE_PROVIDER_SITE_OTHER): Payer: Medicare Other | Admitting: Cardiovascular Disease

## 2013-03-27 VITALS — BP 138/82 | HR 76 | Ht 69.0 in | Wt 244.2 lb

## 2013-03-27 DIAGNOSIS — I4510 Unspecified right bundle-branch block: Secondary | ICD-10-CM

## 2013-03-27 DIAGNOSIS — I219 Acute myocardial infarction, unspecified: Secondary | ICD-10-CM

## 2013-03-27 DIAGNOSIS — F17200 Nicotine dependence, unspecified, uncomplicated: Secondary | ICD-10-CM

## 2013-03-27 DIAGNOSIS — I2510 Atherosclerotic heart disease of native coronary artery without angina pectoris: Secondary | ICD-10-CM

## 2013-03-27 DIAGNOSIS — I10 Essential (primary) hypertension: Secondary | ICD-10-CM

## 2013-03-27 DIAGNOSIS — E785 Hyperlipidemia, unspecified: Secondary | ICD-10-CM

## 2013-03-27 DIAGNOSIS — I213 ST elevation (STEMI) myocardial infarction of unspecified site: Secondary | ICD-10-CM

## 2013-03-27 DIAGNOSIS — E669 Obesity, unspecified: Secondary | ICD-10-CM

## 2013-03-27 DIAGNOSIS — Z720 Tobacco use: Secondary | ICD-10-CM

## 2013-03-27 DIAGNOSIS — E119 Type 2 diabetes mellitus without complications: Secondary | ICD-10-CM

## 2013-03-27 NOTE — Patient Instructions (Addendum)
Your physician recommends that you schedule a follow-up appointment in: 6 months  

## 2013-03-28 ENCOUNTER — Encounter: Payer: Self-pay | Admitting: Cardiovascular Disease

## 2013-03-28 NOTE — Progress Notes (Signed)
Patient ID: Bobby [name], male   DOB: 07-Mar-1946, 67 y.o.   MRN: [id number]   HPI: Bobby [name], is a 67 y.o. male presents to the office today for two month followup evaluation. Bobby [name] is a 67 year old gentleman who has a long-standing tobacco history having started smoking at age 82, history of hypertension, remote small TIA/CVA 4 years ago who presented to the hospital on 08/04/2012 in the setting of inferior ST segment elevation myocardial infarction. Catheterization by me revealed a 99% stenosis of the right coronary artery. He did have concomitant CAD involving his LAD and circumflex vessels. He underwent acute percutaneous coronary intervention with an excellent door to balloon time of only 20 minutes and ultimately insertion of a 3.25x32 mm progress DES stent post dilated 3.3. An echo done in the hospital showed an EF of 50-60% with mild basal inferior probable scar and moderate pulmonary hypertension with PA pressure of 45 mm. I last saw him in February 2014. At that time, I reduced his lisinopril dose from 40 mg 20 mg and also reduce his HCTZ to 12.5 mg.  When I saw Bobby [name] in May 2014, he did note shortness of breath with activity. He denied definitive chest pain. At that time, he realized that he had inadvertently taking the atorvastatin 40 mg dose and this was resumed. In addition, his Toprol dose was increased from 50 to 75 mg daily. I did schedule him for a six-month followup nuclear study to make certain he was having significant ischemia and other vascular territories where he was noted to have concomitant CAD at the time of his initial catheterization. The nuclear study showed an ejection fraction of 66%. Perfusion was essentially normal with exception of a small region of fixed inferoseptal bowel artifact and possible small area of inferobasilar scar.  Presently he denies any awareness of tachycardia palpitations or recurrent chest pain. He denies presyncope or syncope.  Past Medical  History  Diagnosis Date  . Stroke   . Hypertension   . Panic attack   . CAD (coronary artery disease),residual non obstructive disease 08/05/2012  . Hyperlipidemia 08/05/2012  . S/P coronary artery stent placement, to RCA Promus DES 08/05/2012  . DM (diabetes mellitus), type 2 new diagnosis 08/05/2012  . Tobacco abuse   . MI (myocardial infarction) 08/04/2012    acute inferior stemi secondary to RCA occlusion; PCI  . Pulmonary HTN     echo 08/05/12, EF 55-60%, PA pressure 69mm    Past Surgical History  Procedure Laterality Date  . Cardiac catheterization  08/04/12    PCI to RCA with DES    No Known Allergies  Current Outpatient Prescriptions  Medication Sig Dispense Refill  . albuterol (PROVENTIL) (2.5 MG/3ML) 0.083% nebulizer solution Take 5 mg by nebulization every 4 (four) hours as needed. For wheezing or SOB      . amLODipine (NORVASC) 10 MG tablet Take 1 tablet (10 mg total) by mouth daily.  30 tablet  5  . aspirin EC 81 MG EC tablet Take 1 tablet (81 mg total) by mouth daily.      [name] atorvastatin (LIPITOR) 40 MG tablet Take 1 tablet (40 mg total) by mouth daily.  30 tablet  11  . clonazePAM (KLONOPIN) 1 MG tablet Take 1 mg by mouth 2 (two) times daily as needed. For anxiety      . fish oil-omega-3 fatty acids 1000 MG capsule Take 2 capsules (2 g total) by mouth daily.      [name]  hydrochlorothiazide (MICROZIDE) 12.5 MG capsule Take 25 mg by mouth daily.       [name] lisinopril (PRINIVIL,ZESTRIL) 20 MG tablet Take 1 tablet (20 mg total) by mouth 2 (two) times daily.  60 tablet  5  . metoprolol succinate (TOPROL-XL) 50 MG 24 hr tablet Take 50 mg by mouth daily.       . nitroGLYCERIN (NITROSTAT) 0.4 MG SL tablet Place 1 tablet (0.4 mg total) under the tongue every 5 (five) minutes x 3 doses as needed for chest pain.  25 tablet  2  . oxyCODONE-acetaminophen (PERCOCET) 10-325 MG per tablet Take 1 tablet by mouth every 8 (eight) hours as needed.      . Ticagrelor (BRILINTA) 90 MG TABS tablet Take  1 tablet (90 mg total) by mouth 2 (two) times daily.  60 tablet  11  . tiotropium (SPIRIVA) 18 MCG inhalation capsule Place 18 mcg into inhaler and inhale daily.       No current facility-administered medications for this visit.    Social history is no and that he is divorced. He doesn't walk but not routinely exercise. He is significantly reduced tobacco but continues to smoke.[name]  ROVis negative for fever chills or night sweats. He is unaware of any blackout spells. He does note shortness of breath particularly with walking. He denies chest pressure. He denies abdominal pain. No Nausea vomiting or diarrhea.  He denies bleeding. He denies change in bowel or bladder habits. He denies change in urine output there is no significant swelling scar dose of HCTZ. He denies rash. Denies myalgias. Other system review is negative.  PE BP 138/82  Pulse 76  Ht 5\' 9"  (1.753 m)  Wt 244 lb 3.2 oz (110.768 kg)  BMI 36.05 kg/m2  General: Alert, oriented, no distress.  HEENT: Normocephalic, atraumatic. Pupils round and reactive; sclera anicteric;  Nose without nasal septal hypertrophy Mouth/Parynx benign; Mallinpatti scale 3 Neck: No JVD, no carotid briuts Lungs: decreased BS diffusely without wheezing;  Heart: RRR, s1 s2 normal 1/6 sem Abdomen: soft, nontender; no hepatosplenomehaly, BS+; abdominal aorta nontender and not dilated by palpation. Pulses 2+ Extremities: no clubbinbg cyanosis or edema, Homan's sign negative  Neurologic: grossly nonfocal   ECG: sinus rhythm at 76 beats per minute; right bundle-branch block with repolarization changes. PR interval 142 ms, QTC 481 ms.   LABS:  BMET    Component Value Date/Time   NA 137 08/06/2012 0505   K 3.8 08/06/2012 0505   CL 102 08/06/2012 0505   CO2 25 08/06/2012 0505   GLUCOSE 111* 08/06/2012 0505   BUN 16 08/06/2012 0505   CREATININE 1.11 08/06/2012 0505   CALCIUM 9.4 08/06/2012 0505   GFRNONAA 67* 08/06/2012 0505   GFRAA 78* 08/06/2012 0505      Hepatic Function Panel  No results found for this basename: prot,  albumin,  ast,  alt,  alkphos,  bilitot,  bilidir,  ibili     CBC    Component Value Date/Time   WBC 10.8* 08/06/2012 0505   RBC 5.05 08/06/2012 0505   HGB 14.5 08/06/2012 0505   HCT 43.9 08/06/2012 0505   PLT 190 08/06/2012 0505   MCV 86.9 08/06/2012 0505   MCH 28.7 08/06/2012 0505   MCHC 33.0 08/06/2012 0505   RDW 13.7 08/06/2012 0505   LYMPHSABS 4.3* 10/22/2010 0045   MONOABS 1.3* 10/22/2010 0045   EOSABS 0.2 10/22/2010 0045   BASOSABS 0.1 10/22/2010 0045     BNP No results found for this  basename: probnp    Lipid Panel     Component Value Date/Time   CHOL 257* 08/05/2012 0430   TRIG 296* 08/05/2012 0430   HDL 32* 08/05/2012 0430   CHOLHDL 8.0 08/05/2012 0430   VLDL 59* 08/05/2012 0430   LDLCALC 166* 08/05/2012 0430     RADIOLOGY: No results found.    ASSESSMENT AND PLAN: Mr. Greenawalt is now 8 months following his acute coronary syndrome when he presented with subtotal occlusion of his right coronary artery. He had diffuse disease beyond the subtotal occlusion and ultimately had successful insertion of a 3.25x32 mm PROMUS DES stent. He did have concomitant CAD with 40-50% proximal LAD narrowing, 30% circumflex marginal stenoses. NMR lipoprofile after initiation of atorvastatin in January 2014 showed marked improvement in total cholesterol of 119 LDL 48 LDL particle #992. HDL was very low at 30 with an increased triglyceride at 203 and significantly reduced HDL particle #24.2. His insulin resistance score was elevated at 65.  His recent nuclear stress test shows significant salvage of myocardium following his successful intervention. His heart rate today is improved from 90 beats a minute to 76 beats per minute and there is resolution of prior PVCs with his increased dose of Toprol. I did discuss with him the importance of absolute smoking cessation the negative consequences of hiseven reduced smoking habit. he is  doing well with current therapy. I'll see him back in the office in 6 months for followup evaluation.       Troy Sine, MD, [facility]  03/28/2013 7:32 PM

## 2013-04-17 ENCOUNTER — Telehealth: Payer: Self-pay | Admitting: Pharmacy Technician

## 2013-04-17 NOTE — Telephone Encounter (Signed)
error 

## 2013-07-23 ENCOUNTER — Other Ambulatory Visit: Payer: Self-pay

## 2013-07-23 MED ORDER — TICAGRELOR 90 MG PO TABS: 90.0000 mg | ORAL_TABLET | Freq: Two times a day (BID) | ORAL | Status: DC

## 2013-07-23 NOTE — Telephone Encounter (Signed)
Rx was sent to pharmacy electronically. 

## 2013-09-17 DIAGNOSIS — R040 Epistaxis: Secondary | ICD-10-CM | POA: Insufficient documentation

## 2013-10-01 ENCOUNTER — Telehealth: Payer: Self-pay | Admitting: *Deleted

## 2013-10-01 ENCOUNTER — Telehealth: Payer: Self-pay | Admitting: Cardiovascular Disease

## 2013-10-01 NOTE — Telephone Encounter (Signed)
Note has been sent to Dr. Claiborne Billings via triage nurse for his response.

## 2013-10-01 NOTE — Telephone Encounter (Signed)
Faxed colonoscopy clearance.

## 2013-10-01 NOTE — Telephone Encounter (Signed)
Message forwarded to Dr. Kelly/Wanda, CMA.  

## 2013-10-01 NOTE — Telephone Encounter (Signed)
Calling to know if the patient can stop his Brilinta before his colonoscopy which is schedule for 02/10/215 .[name] Please Call .[name]    Thanks

## 2013-10-02 NOTE — Telephone Encounter (Signed)
PCI 08/2012.  Hold brilinta for 5 days before procedure

## 2013-10-02 NOTE — Telephone Encounter (Signed)
Notified Shelia per Dr. Claiborne Billings okay for patient to hold his Brillinta for 5 days prior to his procedure.

## 2013-10-26 ENCOUNTER — Other Ambulatory Visit: Payer: Self-pay | Admitting: *Deleted

## 2013-10-26 MED ORDER — LISINOPRIL 20 MG PO TABS: 20.0000 mg | ORAL_TABLET | Freq: Two times a day (BID) | ORAL | Status: DC

## 2013-10-26 MED ORDER — METOPROLOL SUCCINATE ER 50 MG PO TB24: 50.0000 mg | ORAL_TABLET | Freq: Every day | ORAL | Status: DC

## 2013-10-26 MED ORDER — AMLODIPINE BESYLATE 10 MG PO TABS: 10.0000 mg | ORAL_TABLET | Freq: Every day | ORAL | Status: DC

## 2013-12-24 NOTE — Telephone Encounter (Signed)
Closed encounter °

## 2014-01-05 ENCOUNTER — Other Ambulatory Visit: Payer: Self-pay

## 2014-01-05 MED ORDER — NITROGLYCERIN 0.4 MG SL SUBL: 0.4000 mg | SUBLINGUAL_TABLET | SUBLINGUAL | Status: DC | PRN

## 2014-01-05 NOTE — Telephone Encounter (Signed)
Rx was sent to pharmacy electronically. 

## 2014-01-26 ENCOUNTER — Other Ambulatory Visit: Payer: Self-pay | Admitting: *Deleted

## 2014-01-26 MED ORDER — TICAGRELOR 90 MG PO TABS: 90.0000 mg | ORAL_TABLET | Freq: Two times a day (BID) | ORAL | Status: DC

## 2014-01-26 MED ORDER — ATORVASTATIN CALCIUM 40 MG PO TABS: 40.0000 mg | ORAL_TABLET | Freq: Every day | ORAL | Status: DC

## 2014-01-26 NOTE — Telephone Encounter (Signed)
Rx refill sent to patient pharmacy   

## 2014-02-23 ENCOUNTER — Other Ambulatory Visit: Payer: Self-pay | Admitting: *Deleted

## 2014-02-23 MED ORDER — ATORVASTATIN CALCIUM 40 MG PO TABS: 40.0000 mg | ORAL_TABLET | Freq: Every day | ORAL | Status: DC

## 2014-02-23 NOTE — Telephone Encounter (Signed)
Rx was sent to pharmacy electronically. 

## 2014-02-24 ENCOUNTER — Other Ambulatory Visit: Payer: Self-pay

## 2014-02-24 MED ORDER — TICAGRELOR 90 MG PO TABS: 90.0000 mg | ORAL_TABLET | Freq: Two times a day (BID) | ORAL | Status: DC

## 2014-02-24 NOTE — Telephone Encounter (Signed)
Rx was sent to pharmacy electronically. 

## 2014-02-25 ENCOUNTER — Ambulatory Visit (INDEPENDENT_AMBULATORY_CARE_PROVIDER_SITE_OTHER): Payer: Medicare Other | Admitting: Cardiovascular Disease

## 2014-02-25 ENCOUNTER — Encounter: Payer: Self-pay | Admitting: Cardiovascular Disease

## 2014-02-25 VITALS — BP 133/68 | HR 78 | Ht 69.0 in | Wt 253.0 lb

## 2014-02-25 DIAGNOSIS — I2510 Atherosclerotic heart disease of native coronary artery without angina pectoris: Secondary | ICD-10-CM

## 2014-02-25 DIAGNOSIS — J449 Chronic obstructive pulmonary disease, unspecified: Secondary | ICD-10-CM

## 2014-02-25 DIAGNOSIS — Z955 Presence of coronary angioplasty implant and graft: Secondary | ICD-10-CM

## 2014-02-25 DIAGNOSIS — Z720 Tobacco use: Secondary | ICD-10-CM

## 2014-02-25 DIAGNOSIS — I4510 Unspecified right bundle-branch block: Secondary | ICD-10-CM

## 2014-02-25 DIAGNOSIS — I10 Essential (primary) hypertension: Secondary | ICD-10-CM

## 2014-02-25 DIAGNOSIS — Z9861 Coronary angioplasty status: Secondary | ICD-10-CM

## 2014-02-25 DIAGNOSIS — F17200 Nicotine dependence, unspecified, uncomplicated: Secondary | ICD-10-CM

## 2014-02-25 DIAGNOSIS — E785 Hyperlipidemia, unspecified: Secondary | ICD-10-CM

## 2014-02-25 DIAGNOSIS — E669 Obesity, unspecified: Secondary | ICD-10-CM

## 2014-02-25 NOTE — Patient Instructions (Signed)
Your physician has requested that you have en exercise stress myoview. For further information please visit [email or web address]. Please follow instruction sheet, as given. This will be done in December with a office to follow.  Your physician recommends that you return for lab work fasting. You can either have this done through our office, or through your PCP.

## 2014-02-25 NOTE — Progress Notes (Signed)
Patient ID: Bobby [name], male   DOB: 10-30-1945, 68 y.o.   MRN: [id number]    HPI: Bobby [name] is a 68 y.o. male presents to the office today for two month followup evaluation. Mr. Bobby [name] is a 68 year old gentleman who has a long-standing tobacco history having started smoking at age 2, history of hypertension, remote small TIA/CVA 4 years ago who presented to the hospital on 08/04/2012 in the setting of inferior ST segment elevation myocardial infarction. Catheterization by me revealed a 99% stenosis of the right coronary artery. He did have concomitant CAD involving his LAD and circumflex vessels. He underwent acute percutaneous coronary intervention with an excellent door to balloon time of only 20 minutes and ultimately insertion of a 3.25x32 mm progress DES stent post dilated 3.3. An echo done in the hospital showed an EF of 50-60% with mild basal inferior probable scar and moderate pulmonary hypertension with PA pressure of 45 mm. When I saw Mr. Bobby [name] in May 2014, he did note shortness of breath with activity. He denied definitive chest pain. At that time, he realized that he had inadvertently taking the atorvastatin 40 mg dose and this was resumed. In addition, his Toprol dose was increased from 50 to 75 mg daily. I did schedule him for a six-month followup nuclear study to make certain he was having significant ischemia and other vascular territories where he was noted to have concomitant CAD at the time of his initial catheterization. The nuclear study showed an ejection fraction of 66%. Perfusion was essentially normal with exception of a small region of fixed inferoseptal bowel artifact and possible small area of inferobasilar scar.  Mr. Bobby [name] unfortunately, continues to smoke at least a pack of cigarettes per day.  He has gained approximately 9 pounds over the past year.  He does experience shortness of breath with activity.  He denies recent chest pain.  He has chronic right bundle branch block  with repolarization changes.  He is diabetic and has pulmonary hypertension.  Past Medical History  Diagnosis Date  . Stroke   . Hypertension   . Panic attack   . CAD (coronary artery disease),residual non obstructive disease 08/05/2012  . Hyperlipidemia 08/05/2012  . S/P coronary artery stent placement, to RCA Promus DES 08/05/2012  . DM (diabetes mellitus), type 2 new diagnosis 08/05/2012  . Tobacco abuse   . MI (myocardial infarction) 08/04/2012    acute inferior stemi secondary to RCA occlusion; PCI  . Pulmonary HTN     echo 08/05/12, EF 55-60%, PA pressure 56mm    Past Surgical History  Procedure Laterality Date  . Cardiac catheterization  08/04/12    PCI to RCA with DES    No Known Allergies  Current Outpatient Prescriptions  Medication Sig Dispense Refill  . albuterol (PROVENTIL) (2.5 MG/3ML) 0.083% nebulizer solution Take 5 mg by nebulization every 4 (four) hours as needed. For wheezing or SOB      . amLODipine (NORVASC) 10 MG tablet Take 1 tablet (10 mg total) by mouth daily.  30 tablet  5  . aspirin 81 MG chewable tablet Chew 81 mg by mouth daily.      [name] aspirin EC 81 MG EC tablet Take 1 tablet (81 mg total) by mouth daily.      [name] atorvastatin (LIPITOR) 40 MG tablet Take 1 tablet (40 mg total) by mouth daily.  30 tablet  0  . citalopram (CELEXA) 20 MG tablet Take 20 mg by mouth daily.      [name]  clonazePAM (KLONOPIN) 1 MG tablet Take 1 mg by mouth 2 (two) times daily as needed. For anxiety      . fish oil-omega-3 fatty acids 1000 MG capsule Take 2 capsules (2 g total) by mouth daily.      . hydrochlorothiazide (MICROZIDE) 12.5 MG capsule Take 25 mg by mouth daily.       [name] lisinopril (PRINIVIL,ZESTRIL) 20 MG tablet Take 1 tablet (20 mg total) by mouth 2 (two) times daily.  60 tablet  5  . metoprolol succinate (TOPROL-XL) 50 MG 24 hr tablet Take 1 tablet (50 mg total) by mouth daily.  30 tablet  5  . nitroGLYCERIN (NITROSTAT) 0.4 MG SL tablet Place 1 tablet (0.4 mg total) under  the tongue every 5 (five) minutes x 3 doses as needed for chest pain.  25 tablet  4  . oxyCODONE-acetaminophen (PERCOCET) 10-325 MG per tablet Take 1 tablet by mouth every 8 (eight) hours as needed.      . ticagrelor (BRILINTA) 90 MG TABS tablet Take 1 tablet (90 mg total) by mouth 2 (two) times daily. Need Appointment for further refills.  60 tablet  1  . tiotropium (SPIRIVA) 18 MCG inhalation capsule Place 18 mcg into inhaler and inhale daily.       No current facility-administered medications for this visit.    Socially,  he is divorced. He doesn't walk but not routinely exercise.  He continues to smoke one pack of cigarettes per day  ROS General: Negative; No fevers, chills, or night sweats;  HEENT: Negative; No changes in vision or hearing, sinus congestion, difficulty swallowing Pulmonary:  Positive for shortness of breath; No cough, wheezing, , hemoptysis Cardiovascular: See history of present illness GI: Negative; No nausea, vomiting, diarrhea, or abdominal pain GU: Negative; No dysuria, hematuria, or difficulty voiding Musculoskeletal: Negative; no myalgias, joint pain, or weakness Hematologic/Oncology: Negative; no easy bruising, bleeding Endocrine: Positive for diabetes no heat/cold intolerance;  Neuro: Negative; no changes in balance, headaches Skin: Negative; No rashes or skin lesions Psychiatric: Negative; No behavioral problems, depression Sleep: Negative; No snoring, daytime sleepiness, hypersomnolence, bruxism, restless legs, hypnogognic hallucinations, no cataplexy Other comprehensive 14 point system review is negative.   PE BP 133/68  Pulse 78  Ht 5\' 9"  (1.753 m)  Wt 253 lb (114.76 kg)  BMI 37.34 kg/m2  General: Alert, oriented, no distress.  Ruddy complexion with rhinophyma HEENT: Normocephalic, atraumatic. Pupils round and reactive; sclera anicteric;  Nose without nasal septal hypertrophy Mouth/Parynx benign; Mallinpatti scale 3 Neck: No JVD, no carotid  bruits .  Normal carotid upstroke Lungs: decreased BS diffusely without wheezing;  Chest wall: Nontender Heart: RRR, s1 s2 normal 1/6 sem; no S3 gallop.  No diastolic rub thrills or heaves Abdomen: Moderate central adiposity with significant panniculus; soft, nontender; no hepatosplenomehaly, BS+; abdominal aorta nontender and not dilated by palpation. Back: No CVA tenderness Pulses 2+ Extremities: no clubbing cyanosis or edema, Homan's sign negative  Neurologic: grossly nonfocal Psychological: Normal affect and mood  ECG (apparently read by (: Normal sinus rhythm.  Right bundle branch block with repolarization changes.  PR interval 148 ms; QTc interval 463 ms   ECG: sinus rhythm at 76 beats per minute; right bundle-branch block with repolarization changes. PR interval 142 ms, QTC 481 ms.   LABS:  BMET    Component Value Date/Time   NA 137 08/06/2012 0505   K 3.8 08/06/2012 0505   CL 102 08/06/2012 0505   CO2 25 08/06/2012 0505   GLUCOSE  111* 08/06/2012 0505   BUN 16 08/06/2012 0505   CREATININE 1.11 08/06/2012 0505   CALCIUM 9.4 08/06/2012 0505   GFRNONAA 67* 08/06/2012 0505   GFRAA 78* 08/06/2012 0505     Hepatic Function Panel  No results found for this basename: prot,  albumin,  ast,  alt,  alkphos,  bilitot,  bilidir,  ibili     CBC    Component Value Date/Time   WBC 10.8* 08/06/2012 0505   RBC 5.05 08/06/2012 0505   HGB 14.5 08/06/2012 0505   HCT 43.9 08/06/2012 0505   PLT 190 08/06/2012 0505   MCV 86.9 08/06/2012 0505   MCH 28.7 08/06/2012 0505   MCHC 33.0 08/06/2012 0505   RDW 13.7 08/06/2012 0505   LYMPHSABS 4.3* 10/22/2010 0045   MONOABS 1.3* 10/22/2010 0045   EOSABS 0.2 10/22/2010 0045   BASOSABS 0.1 10/22/2010 0045     BNP No results found for this basename: probnp    Lipid Panel     Component Value Date/Time   CHOL 257* 08/05/2012 0430   TRIG 296* 08/05/2012 0430   HDL 32* 08/05/2012 0430   CHOLHDL 8.0 08/05/2012 0430   VLDL 59* 08/05/2012 0430   LDLCALC 166*  08/05/2012 0430     RADIOLOGY: No results found.    ASSESSMENT AND PLAN:  Mr. Aaron has established coronary artery disease and suffered an acute coronary syndrome on 08/04/2012 acute coronary syndrome when he presented with subtotal occlusion of his right coronary artery. He had diffuse disease beyond the subtotal occlusion and ultimately had successful insertion of a 3.25x32 mm PROMUS DES stent. He did have concomitant CAD with 40-50% proximal LAD narrowing, 30% circumflex marginal stenoses. NMR lipoprofile after initiation of atorvastatin in January 2014 showed marked improvement in total cholesterol of 119 LDL 48 LDL particle #992. HDL was very low at 30 with an increased triglyceride at 203 and significantly reduced HDL particle #24.2. His insulin resistance score was elevated at 65.  Unfortunately, he has continued to smoke cigarettes.  He does have moderate pulmonary hypertension noted on echocardiography.  His blood pressure today is controlled on current dose of Norvasc 10 mg in addition to his Toprol 50 mg and hydrochlorothiazide 25 mg.  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  He continues to take Spiriva for COPD.  He is on Klonopin for anxiety.  He has gained 9 pounds over the past year.  He does note shortness of breath with activity.  Again, discussed the importance of smoking cessation.  Blood work will be obtained in the fasting state and medication adjustments will be made.  With his ongoing tobacco history, and CAD next year.  I am recommending a followup nuclear perfusion study for further evaluation of ischemia.  I will see him in the office for followup and further recommendations will be made at that time.     Troy Sine, MD, [facility]  02/25/2014 5:43 PM

## 2014-03-03 ENCOUNTER — Telehealth (HOSPITAL_COMMUNITY): Payer: Self-pay

## 2014-03-03 NOTE — Telephone Encounter (Signed)
Pt and sister given detailed instructions for NUC MPI on Tuesday. Pt and sister did RBV. Pt is to bring albuterol inhaler.

## 2014-03-09 ENCOUNTER — Encounter (HOSPITAL_COMMUNITY): Payer: Medicare Other

## 2014-03-24 ENCOUNTER — Telehealth (HOSPITAL_COMMUNITY): Payer: Self-pay

## 2014-03-24 NOTE — Telephone Encounter (Signed)
Encounter complete. 

## 2014-03-26 ENCOUNTER — Other Ambulatory Visit (HOSPITAL_COMMUNITY): Payer: Self-pay | Admitting: Cardiovascular Disease

## 2014-03-26 ENCOUNTER — Ambulatory Visit (HOSPITAL_COMMUNITY)
Admission: RE | Admit: 2014-03-26 | Discharge: 2014-03-26 | Disposition: A | Discharge status: Home / Self Care | Payer: Medicare Other | Source: Ambulatory Visit | Attending: Cardiovascular Disease | Admitting: Cardiovascular Disease

## 2014-03-26 DIAGNOSIS — I25810 Atherosclerosis of coronary artery bypass graft(s) without angina pectoris: Secondary | ICD-10-CM

## 2014-03-26 DIAGNOSIS — I2510 Atherosclerotic heart disease of native coronary artery without angina pectoris: Secondary | ICD-10-CM | POA: Diagnosis not present

## 2014-03-26 MED ORDER — TECHNETIUM TC 99M SESTAMIBI GENERIC - CARDIOLITE
10.8000 | Freq: Once | INTRAVENOUS | Status: AC | PRN
  Administered 2014-03-26: 11 via INTRAVENOUS

## 2014-03-26 MED ORDER — TECHNETIUM TC 99M SESTAMIBI GENERIC - CARDIOLITE
30.3000 | Freq: Once | INTRAVENOUS | Status: AC | PRN
  Administered 2014-03-26: 30.3 via INTRAVENOUS

## 2014-03-26 NOTE — Procedures (Addendum)
[name] [address] [place] [address] [id number]  Cardiology Nuclear Med Study  Bobby [name] is a 68 y.o. male     MRN : [id number]     DOB: 08/19/1946  Procedure Date: 03/26/2014  Nuclear Med Background Indication for Stress Test:  Surgical Clearance and Stent Patency History:  COPD and CAD;MI-08/2012;STENT/PTCA-08/2012;Last NUC MPI on 02/26/2013-nonischemic;EF=66% Cardiac Risk Factors: CVA, Hypertension, Lipids, NIDDM, Obesity, RBBB, Smoker and pulmonary HTN  Symptoms:  DOE and Light-Headedness   Nuclear Pre-Procedure Caffeine/Decaff Intake:  1:00am NPO After: 11am   IV Site: R Forearm  IV 0.9% NS with Angio Cath:  22g  Chest Size (in):  52"  IV Started by: Rolene Course, RN  Height: 5\' 9"  (1.753 m)  Cup Size: n/a  BMI:  Body mass index is 37.34 kg/(m^2). Weight:  253 lb (114.76 kg)   Tech Comments:  n/a    Nuclear Med Study 1 or 2 day study: 1 day  Stress Test Type:  Stress  Order Authorizing Provider:  Shelva Majestic, MD   Resting Radionuclide: Technetium 70m Sestamibi  Resting Radionuclide Dose: 10.8 mCi   Stress Radionuclide:  Technetium 41m Sestamibi  Stress Radionuclide Dose: 30.3 mCi           Stress Protocol Rest HR: 88 Stress HR: 139  Rest BP: 138/69 Stress BP: 215/76  Exercise Time (min): 4:40 METS: 5.20   Predicted Max HR: 152 bpm % Max HR: 91.45 bpm Rate Pressure Product: [phone]  Dose of Adenosine (mg):  n/a Dose of Lexiscan: n/a mg  Dose of Atropine (mg): n/a Dose of Dobutamine: n/a mcg/kg/min (at max HR)  Stress Test Technologist: Mellody Memos, CCT Nuclear Technologist: Imagene Riches, CNMT   Rest Procedure:  Myocardial perfusion imaging was performed at rest 45 minutes following the intravenous administration of Technetium 25m Sestamibi. Stress Procedure:  The patient performed treadmill exercise using a Bruce  Protocol for 4 minutes 40 seconds. The patient stopped due to extreme  shortness of breath and extreme fatigue. Patient denied any chest pain.  There were significant ST-T wave changes.  Technetium 10m Sestamibi was injected IV at peak exercise and myocardial perfusion imaging was performed after a brief delay.  Transient Ischemic Dilatation (Normal <1.22):  1.05  QGS EDV:  87 ml QGS ESV:  29 ml LV Ejection Fraction: 67%     Rest ECG: NSR-RBBB  Stress ECG: No significant ST segment change suggestive of ischemia.  QPS Raw Data Images:  Mild diaphragmatic attenuation.  Normal left ventricular size. Stress Images:  There is decreased uptake in the inferior wall. Rest Images:  Comparison with the stress images reveals no significant change. Subtraction (SDS):  There is a fixed inferior defect that is most consistent with diaphragmatic attenuation. LV Wall Motion:  NL LV Function; NL Wall Motion  Impression Exercise Capacity:  Fair exercise capacity. BP Response:  Hypertensive blood pressure response. Clinical Symptoms:  The exercise was limited by dyspnea. ECG Impression:  No significant ST segment change suggestive of ischemia. Comparison with Prior Nuclear Study: No significant change from previous study   Overall Impression:  Low risk stress nuclear study with diaphragmatic attenuation artifact.Bobby Klein, MD  03/26/2014 5:15 PM

## 2014-04-01 ENCOUNTER — Other Ambulatory Visit: Payer: Self-pay | Admitting: *Deleted

## 2014-04-01 MED ORDER — ATORVASTATIN CALCIUM 40 MG PO TABS: 40.0000 mg | ORAL_TABLET | Freq: Every day | ORAL | Status: DC

## 2014-04-01 NOTE — Telephone Encounter (Signed)
Rx was sent to pharmacy electronically. 

## 2014-04-07 ENCOUNTER — Encounter: Payer: Self-pay | Admitting: *Deleted

## 2014-04-08 ENCOUNTER — Other Ambulatory Visit: Payer: Self-pay | Admitting: *Deleted

## 2014-04-08 MED ORDER — LISINOPRIL 20 MG PO TABS: 20.0000 mg | ORAL_TABLET | Freq: Two times a day (BID) | ORAL | Status: DC

## 2014-04-26 ENCOUNTER — Other Ambulatory Visit: Payer: Self-pay

## 2014-04-26 MED ORDER — AMLODIPINE BESYLATE 10 MG PO TABS: 10.0000 mg | ORAL_TABLET | Freq: Every day | ORAL | Status: DC

## 2014-04-26 NOTE — Telephone Encounter (Signed)
Rx was sent to pharmacy electronically. 

## 2014-05-07 ENCOUNTER — Telehealth: Payer: Self-pay | Admitting: *Deleted

## 2014-05-07 NOTE — Telephone Encounter (Signed)
Returned surgical clearance for penile prothesis to Dr. [name].

## 2014-05-11 ENCOUNTER — Other Ambulatory Visit: Payer: Self-pay | Admitting: Urology

## 2014-05-24 ENCOUNTER — Encounter: Payer: Self-pay | Admitting: Cardiovascular Disease

## 2014-05-24 ENCOUNTER — Ambulatory Visit (INDEPENDENT_AMBULATORY_CARE_PROVIDER_SITE_OTHER): Payer: Medicare Other | Admitting: Cardiovascular Disease

## 2014-05-24 VITALS — BP 132/74 | HR 81 | Ht 69.0 in | Wt 257.6 lb

## 2014-05-24 DIAGNOSIS — Z720 Tobacco use: Secondary | ICD-10-CM

## 2014-05-24 DIAGNOSIS — I4510 Unspecified right bundle-branch block: Secondary | ICD-10-CM

## 2014-05-24 DIAGNOSIS — E785 Hyperlipidemia, unspecified: Secondary | ICD-10-CM

## 2014-05-24 DIAGNOSIS — I10 Essential (primary) hypertension: Secondary | ICD-10-CM

## 2014-05-24 DIAGNOSIS — Z9861 Coronary angioplasty status: Secondary | ICD-10-CM

## 2014-05-24 DIAGNOSIS — I2510 Atherosclerotic heart disease of native coronary artery without angina pectoris: Secondary | ICD-10-CM

## 2014-05-24 DIAGNOSIS — I25810 Atherosclerosis of coronary artery bypass graft(s) without angina pectoris: Secondary | ICD-10-CM

## 2014-05-24 DIAGNOSIS — E669 Obesity, unspecified: Secondary | ICD-10-CM

## 2014-05-24 DIAGNOSIS — F17200 Nicotine dependence, unspecified, uncomplicated: Secondary | ICD-10-CM

## 2014-05-24 DIAGNOSIS — Z955 Presence of coronary angioplasty implant and graft: Secondary | ICD-10-CM

## 2014-05-24 NOTE — Progress Notes (Signed)
Patient ID: Bobby [name], male   DOB: 08-01-1946, 68 y.o.   MRN: [id number]    HPI: Bobby [name] is a 68 y.o. male presents to the office today for a 3 month followup evaluation.   Bobby [name]  has a long-standing tobacco history having started smoking at age 40, history of hypertension, remote small TIA/CVA 4 years ago who presented to the hospital on 08/04/2012 in the setting of inferior ST segment elevation myocardial infarction. Catheterization by me revealed a 99% stenosis of the right coronary artery. He did have concomitant CAD involving his LAD and circumflex vessels. He underwent acute percutaneous coronary intervention with an excellent door to balloon time of only 20 minutes and ultimately insertion of a 3.25x32 mm progress DES stent post dilated 3.3. An echo done in the hospital showed an EF of 50-60% with mild basal inferior probable scar and moderate pulmonary hypertension with PA pressure of 45 mm. When I saw Bobby [name] in May 2014, he did note shortness of breath with activity. He denied definitive chest pain. At that time, he realized that he had inadvertentlystopped taking the atorvastatin 40 mg dose and this was resumed. In addition, his Toprol dose was increased from 50 to 75 mg daily. I did schedule him for a six-month followup nuclear study to make certain he was having significant ischemia and other vascular territories where he was noted to have concomitant CAD at the time of his initial catheterization. The nuclear study showed an ejection fraction of 66%. Perfusion was essentially normal with exception of a small region of fixed inferoseptal bowel artifact and possible small area of inferobasilar scar.   Bobby [name] unfortunately continues to smoke at least a pack of cigarettes per day.  He has gained approximately 9 pounds over the past year.  He does experience shortness of breath with activity.  He denies recent chest pain.  He has chronic right bundle branch block with repolarization  changes.  He is diabetic and has pulmonary hypertension.  Bobby [name] underwent a nuclear perfusion study on 03/26/2014.  This continues to be low risk and showed normal LV function and normal wall motion.  There was a fixed inferior defect that was felt most consistent with diaphragmatic attenuation.  There was no evidence for ischemia.  He tells me he will be undergoing surgery by Dr. Gaynelle [name] 14.  No prosthesis, which is tentatively scheduled for total and 2015.  He presents now for evaluation.  Of note, his daughter died at age 23 in a possible homicide ~ 3 weeks ago while at the beach.  This is still under investigation.  Past Medical History  Diagnosis Date  . Stroke   . Hypertension   . Panic attack   . CAD (coronary artery disease),residual non obstructive disease 08/05/2012  . Hyperlipidemia 08/05/2012  . S/P coronary artery stent placement, to RCA Promus DES 08/05/2012  . DM (diabetes mellitus), type 2 new diagnosis 08/05/2012  . Tobacco abuse   . MI (myocardial infarction) 08/04/2012    acute inferior stemi secondary to RCA occlusion; PCI  . Pulmonary HTN     echo 08/05/12, EF 55-60%, PA pressure 5mm    Past Surgical History  Procedure Laterality Date  . Cardiac catheterization  08/04/12    PCI to RCA with DES    Allergies  Allergen Reactions  . Hydrocodone Itching    Current Outpatient Prescriptions  Medication Sig Dispense Refill  . albuterol (PROVENTIL) (2.5 MG/3ML) 0.083% nebulizer solution Take 5 mg  by nebulization every 4 (four) hours as needed. For wheezing or SOB      . aspirin 81 MG chewable tablet Chew 81 mg by mouth daily.      [name] aspirin EC 81 MG EC tablet Take 1 tablet (81 mg total) by mouth daily.      . clonazePAM (KLONOPIN) 1 MG tablet Take 1 mg by mouth 2 (two) times daily as needed. For anxiety      . fish oil-omega-3 fatty acids 1000 MG capsule Take 2 capsules (2 g total) by mouth daily.      . hydrochlorothiazide (MICROZIDE) 12.5 MG capsule Take 25 mg  by mouth daily.       [name] lisinopril (PRINIVIL,ZESTRIL) 20 MG tablet Take 1 tablet (20 mg total) by mouth 2 (two) times daily.  60 tablet  5  . metoprolol succinate (TOPROL-XL) 50 MG 24 hr tablet Take 1 tablet (50 mg total) by mouth daily.  30 tablet  5  . nitroGLYCERIN (NITROSTAT) 0.4 MG SL tablet Place 1 tablet (0.4 mg total) under the tongue every 5 (five) minutes x 3 doses as needed for chest pain.  25 tablet  4  . oxyCODONE-acetaminophen (PERCOCET) 10-325 MG per tablet Take 1 tablet by mouth every 8 (eight) hours as needed.      . ticagrelor (BRILINTA) 90 MG TABS tablet Take 1 tablet (90 mg total) by mouth 2 (two) times daily. Need Appointment for further refills.  60 tablet  1  . tiotropium (SPIRIVA) 18 MCG inhalation capsule Place 18 mcg into inhaler and inhale daily.       No current facility-administered medications for this visit.    Socially,  he is divorced. He doesn't walk but not routinely exercise.  He continues to smoke one pack of cigarettes per day  ROS General: Negative; No fevers, chills, or night sweats;  HEENT: Negative; No changes in vision or hearing, sinus congestion, difficulty swallowing Pulmonary:  Positive for shortness of breath; No cough, wheezing, , hemoptysis Cardiovascular: See history of present illness GI: Negative; No nausea, vomiting, diarrhea, or abdominal pain GU: Negative; No dysuria, hematuria, or difficulty voiding Musculoskeletal: Negative; no myalgias, joint pain, or weakness Hematologic/Oncology: Negative; no easy bruising, bleeding Endocrine: Positive for diabetes no heat/cold intolerance;  Neuro: Negative; no changes in balance, headaches Skin: Negative; No rashes or skin lesions Psychiatric: Negative; No behavioral problems, depression Sleep: Negative; No snoring, daytime sleepiness, hypersomnolence, bruxism, restless legs, hypnogognic hallucinations, no cataplexy Other comprehensive 14 point system review is negative.   PE BP 132/74   Pulse 81  Ht 5\' 9"  (1.753 m)  Wt 257 lb 9.6 oz (116.847 kg)  BMI 38.02 kg/m2  General: Alert, oriented, no distress.  Ruddy complexion with rhinophyma HEENT: Normocephalic, atraumatic. Pupils round and reactive; sclera anicteric;  Nose without nasal septal hypertrophy Mouth/Parynx benign; Mallinpatti scale 3 Neck: No JVD, no carotid bruits .  Normal carotid upstroke Lungs: decreased BS diffusely without wheezing;  Chest wall: Nontender Heart: RRR, s1 s2 normal 1/6 sem; no S3 gallop.  No diastolic rub thrills or heaves Abdomen: Moderate central adiposity with significant panniculus; soft, nontender; no hepatosplenomehaly, BS+; abdominal aorta nontender and not dilated by palpation. Back: No CVA tenderness Pulses 2+ Extremities: no clubbing cyanosis or edema, Homan's sign negative  Neurologic: grossly nonfocal Psychological: Normal affect and mood  ECG (independently read by me and (: Normal sinus rhythm.  Right bundle branch block with repolarization changes.  Ventricular rate 81.  02/25/2014 ECG Normal sinus rhythm.  Right bundle branch block with repolarization changes.  PR interval 148 ms; QTc interval 463 ms   PriorECG: sinus rhythm at 76 beats per minute; right bundle-branch block with repolarization changes. PR interval 142 ms, QTC 481 ms.   LABS:  BMET    Component Value Date/Time   NA 137 08/06/2012 0505   K 3.8 08/06/2012 0505   CL 102 08/06/2012 0505   CO2 25 08/06/2012 0505   GLUCOSE 111* 08/06/2012 0505   BUN 16 08/06/2012 0505   CREATININE 1.11 08/06/2012 0505   CALCIUM 9.4 08/06/2012 0505   GFRNONAA 67* 08/06/2012 0505   GFRAA 78* 08/06/2012 0505     Hepatic Function Panel  No results found for this basename: prot,  albumin,  ast,  alt,  alkphos,  bilitot,  bilidir,  ibili     CBC    Component Value Date/Time   WBC 10.8* 08/06/2012 0505   RBC 5.05 08/06/2012 0505   HGB 14.5 08/06/2012 0505   HCT 43.9 08/06/2012 0505   PLT 190 08/06/2012 0505   MCV 86.9  08/06/2012 0505   MCH 28.7 08/06/2012 0505   MCHC 33.0 08/06/2012 0505   RDW 13.7 08/06/2012 0505   LYMPHSABS 4.3* 10/22/2010 0045   MONOABS 1.3* 10/22/2010 0045   EOSABS 0.2 10/22/2010 0045   BASOSABS 0.1 10/22/2010 0045     BNP No results found for this basename: probnp    Lipid Panel     Component Value Date/Time   CHOL 257* 08/05/2012 0430   TRIG 296* 08/05/2012 0430   HDL 32* 08/05/2012 0430   CHOLHDL 8.0 08/05/2012 0430   VLDL 59* 08/05/2012 0430   LDLCALC 166* 08/05/2012 0430     RADIOLOGY: No results found.    ASSESSMENT AND PLAN:  Bobby [name] established coronary artery disease and suffered an acute coronary syndrome on 08/04/2012  when he presented with subtotal occlusion of his RCA. He had diffuse disease beyond the subtotal occlusion and ultimately had successful insertion of a 3.25x32 mm PROMUS DES stent. He did have concomitant CAD with 40-50% proximal LAD narrowing, 30% circumflex marginal stenoses. NMR lipoprofile after initiation of atorvastatin in January 2014 showed marked improvement in total cholesterol of 119 LDL 48 LDL particle #992. HDL was very low at 30 with an increased triglyceride at 203 and significantly reduced HDL particle #24.2. His insulin resistance score was elevated at 65.  Unfortunately, he has continued to smoke cigarettes.  He does have moderate pulmonary hypertension noted on echocardiography.  His blood pressure today is controlled at 132/74 on his current dose of Norvasc 10 mg, lisinopril 20 mg twice a day,  Toprol 50 mg and hydrochlorothiazide 25 mg.  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  He continues to take Spiriva for COPD.  He is on Klonopin for anxiety.  I reviewed his most recent nuclear perfusion study in this shows normal LV function without scar or ischemia with probable diaphragmatic attenuation.  He will be undergoing penile prosthesis surgery on October 1.  He will need to hold his aspirin and Brilinta for a minimum of 5  days prior to surgery and I suggested that he take his last dose on 05/27/2014.  I again discussed the importance of complete smoking cessation.  He apparently also had stopped taking his atorvastatin and I again recommended resumption of 40 mg daily.  He will have followup blood work in approximately 6 weeks.  I will see him in 6  months for cardiology reevaluation.     Troy Sine, MD, [facility]  05/24/2014 3:24 PM

## 2014-05-24 NOTE — Patient Instructions (Signed)
Your physician has recommended you make the following change in your medication: restart the atorvastatin  ( cholesterol medication)  Your physician recommends that you return for lab work 6 weeks after restarting the atorvastatin.  Your physician wants you to follow-up in  6 months  You will receive a reminder letter in the mail two months in advance. If you don't receive a letter, please call our office to schedule the follow-up appointment.

## 2014-05-28 ENCOUNTER — Ambulatory Visit (HOSPITAL_COMMUNITY)
Admission: RE | Admit: 2014-05-28 | Discharge: 2014-05-28 | Disposition: A | Discharge status: Home / Self Care | Payer: Medicare Other | Source: Ambulatory Visit | Attending: Anesthesiology | Admitting: Anesthesiology

## 2014-05-28 ENCOUNTER — Encounter (HOSPITAL_COMMUNITY): Payer: Self-pay

## 2014-05-28 ENCOUNTER — Encounter (HOSPITAL_COMMUNITY)
Admission: RE | Admit: 2014-05-28 | Discharge: 2014-05-28 | Disposition: A | Discharge status: Home / Self Care | Payer: Medicare Other | Source: Ambulatory Visit | Attending: Urology | Admitting: Urology

## 2014-05-28 ENCOUNTER — Encounter (HOSPITAL_COMMUNITY): Payer: Self-pay | Admitting: Pharmacy Technician

## 2014-05-28 DIAGNOSIS — J9819 Other pulmonary collapse: Secondary | ICD-10-CM | POA: Insufficient documentation

## 2014-05-28 DIAGNOSIS — J4489 Other specified chronic obstructive pulmonary disease: Secondary | ICD-10-CM | POA: Insufficient documentation

## 2014-05-28 DIAGNOSIS — J449 Chronic obstructive pulmonary disease, unspecified: Secondary | ICD-10-CM | POA: Diagnosis present

## 2014-05-28 HISTORY — DX: Male erectile dysfunction, unspecified: N52.9

## 2014-05-28 LAB — APTT: aPTT: 33 seconds (ref 24–37)

## 2014-05-28 LAB — BASIC METABOLIC PANEL
ANION GAP: 14 (ref 5–15)
BUN: 19 mg/dL (ref 6–23)
CO2: 26 mEq/L (ref 19–32)
Calcium: 9.8 mg/dL (ref 8.4–10.5)
Chloride: 99 mEq/L (ref 96–112)
Creatinine, Ser: 1.09 mg/dL (ref 0.50–1.35)
GFR calc non Af Amer: 68 mL/min — ABNORMAL LOW (ref >90)
GFR, EST AFRICAN AMERICAN: 79 mL/min — AB (ref >90)
GLUCOSE: 139 mg/dL — AB (ref 70–99)
POTASSIUM: 4.4 meq/L (ref 3.7–5.3)
Sodium: 139 mEq/L (ref 137–147)

## 2014-05-28 LAB — CBC
HEMATOCRIT: 43.8 % (ref 39.0–52.0)
HEMOGLOBIN: 14.6 g/dL (ref 13.0–17.0)
MCH: 28.9 pg (ref 26.0–34.0)
MCHC: 33.3 g/dL (ref 30.0–36.0)
MCV: 86.7 fL (ref 78.0–100.0)
Platelets: 227 10*3/uL (ref 150–400)
RBC: 5.05 MIL/uL (ref 4.22–5.81)
RDW: 14.8 % (ref 11.5–15.5)
WBC: 10.5 10*3/uL (ref 4.0–10.5)

## 2014-05-28 LAB — PROTIME-INR
INR: 0.91 (ref 0.00–1.49)
Prothrombin Time: 12.3 seconds (ref 11.6–15.2)

## 2014-05-28 NOTE — Progress Notes (Signed)
Your Pt has screened with an elevated risk for obstructive sleep apnea using the Stop-Bang tool during a presurgical  Visit. A score of four or greater is an elevated risk. 

## 2014-05-28 NOTE — Patient Instructions (Addendum)
[place]  05/28/2014   Your procedure is scheduled on: 10-1  -2015 Thursday  Enter through [facility] Entrance and follow signs to [facility] Arrive at     0700   AM .  Call this number if you have problems the morning of surgery: [phone]  Or Presurgical Testing [phone].   For Living Will and/or Health Care Power Attorney Forms: please provide copy for your medical record,may bring AM of surgery(Forms should be already notarized -we do not provide this service).(05-28-14 Yes, family is aware-prefer not to bring).    Incentive Spirometer  An incentive spirometer is a tool that can help keep your lungs clear and active. This tool measures how well you are filling your lungs with each breath. Taking long deep breaths may help reverse or decrease the chance of developing breathing (pulmonary) problems (especially infection) following:  A long period of time when you are unable to move or be active. BEFORE THE PROCEDURE   If the spirometer includes an indicator to show your best effort, your nurse or respiratory therapist will set it to a desired goal.  If possible, sit up straight or lean slightly forward. Try not to slouch.  Hold the incentive spirometer in an upright position. INSTRUCTIONS FOR USE  1. Sit on the edge of your bed if possible, or sit up as far as you can in bed or on a chair. 2. Hold the incentive spirometer in an upright position. 3. Breathe out normally. 4. Place the mouthpiece in your mouth and seal your lips tightly around it. 5. Breathe in slowly and as deeply as possible, raising the piston or the ball toward the top of the column. 6. Hold your breath for 3-5 seconds or for as long as possible. Allow the piston or ball to fall to the bottom of the column. 7. Remove the mouthpiece from your mouth and breathe out normally. 8. Rest for a few seconds and repeat Steps 1 through 7 at least 10 times every 1-2 hours when you are awake. Take  your time and take a few normal breaths between deep breaths. 9. The spirometer may include an indicator to show your best effort. Use the indicator as a goal to work toward during each repetition. 10. After each set of 10 deep breaths, practice coughing to be sure your lungs are clear. If you have an incision (the cut made at the time of surgery), support your incision when coughing by placing a pillow or rolled up towels firmly against it. Once you are able to get out of bed, walk around indoors and cough well. You may stop using the incentive spirometer when instructed by your caregiver.  RISKS AND COMPLICATIONS  Take your time so you do not get dizzy or light-headed.  If you are in pain, you may need to take or ask for pain medication before doing incentive spirometry. It is harder to take a deep breath if you are having pain. AFTER USE  Rest and breathe slowly and easily.  It can be helpful to keep track of a log of your progress. Your caregiver can provide you with a simple table to help with this. If you are using the spirometer at home, follow these instructions: [place] IF:   You are having difficultly using the spirometer.  You have trouble using the spirometer as often as instructed.  Your pain medication is not giving enough relief while using the spirometer.  You  develop fever of 100.5 F (38.1 C) or higher. SEEK IMMEDIATE MEDICAL CARE IF:   You cough up bloody sputum that had not been present before.  You develop fever of 102 F (38.9 C) or greater.  You develop worsening pain at or near the incision site. MAKE SURE YOU:   Understand these instructions.  Will watch your condition.  Will get help right away if you are not doing well or get worse. Document Released: 12/31/2006 Document Revised: 11/12/2011 Document Reviewed: 03/03/2007 [facility] Patient Information 2014 [place].   ________________________________________________________________________    Do not eat food/ or drink: After Midnight.      Take these medicines the morning of surgery with A SIP OF WATER: Amlodipine. Clonazepam(if needed). Metoprolol. Bring/use Inhalers. Use Brilinta as per MD instructions.   Do not wear jewelry, make-up or nail polish.  Do not wear lotions, powders, or perfumes. You may not  wear deodorant.  Do not shave 48 hours(2 days) prior to first CHG shower(legs and under arms).(Shaving face and neck okay.)  Do not bring valuables to the hospital.(Hospital is not responsible for lost valuables).  Contacts, dentures or removable bridgework, body piercing, hair pins may not be worn into surgery.  Leave suitcase in the car. After surgery it may be brought to your room.  For patients admitted to the hospital, checkout time is 11:00 AM the day of discharge.(Restricted visitors-Any Persons displaying flu-like symptoms or illness).    Patients discharged the day of surgery will not be allowed to drive home. Must have responsible person with you x 24 hours once discharged.  Name and phone number of your driver:  Will arrange Special Instructions: CHG(Chlorhedine 4%-"Hibiclens","Betasept","Aplicare") Shower Use Special Wash: see special instructions.(avoid face and genitals)   Please read over the following fact sheets that you were given: Incentive Spirometry Instruction.   _________________________    [facility] - Preparing for Surgery Before surgery, you can play an important role.  Because skin is not sterile, your skin needs to be as free of germs as possible.  You can reduce the number of germs on your skin by washing with CHG (chlorahexidine gluconate) soap before surgery.  CHG is an antiseptic cleaner which kills germs and bonds with the skin to continue killing germs even after washing. Please DO NOT use if you have an allergy to CHG or antibacterial soaps.  If your skin  becomes reddened/irritated stop using the CHG and inform your nurse when you arrive at Short Stay. Do not shave (including legs and underarms) for at least 48 hours prior to the first CHG shower.  You may shave your face/neck. Please follow these instructions carefully:  1.  Shower with CHG Soap the night before surgery and the  morning of Surgery.  2.  If you choose to wash your hair, wash your hair first as usual with your  normal  shampoo.  3.  After you shampoo, rinse your hair and body thoroughly to remove the  shampoo.                           4.  Use CHG as you would any other liquid soap.  You can apply chg directly  to the skin and wash                       Gently with a scrungie or clean washcloth.  5.  Apply the CHG Soap to your  body ONLY FROM THE NECK DOWN.   Do not use on face/ open                           Wound or open sores. Avoid contact with eyes, ears mouth and genitals (private parts).                       Wash face,  Genitals (private parts) with your normal soap.             6.  Wash thoroughly, paying special attention to the area where your surgery  will be performed.  7.  Thoroughly rinse your body with warm water from the neck down.  8.  DO NOT shower/wash with your normal soap after using and rinsing off  the CHG Soap.                9.  Pat yourself dry with a clean towel.            10.  Wear clean pajamas.            11.  Place clean sheets on your bed the night of your first shower and do not  sleep with pets. Day of Surgery : Do not apply any lotions/deodorants the morning of surgery.  Please wear clean clothes to the hospital/surgery center.  FAILURE TO FOLLOW THESE INSTRUCTIONS MAY RESULT IN THE CANCELLATION OF YOUR SURGERY PATIENT SIGNATURE_________________________________  NURSE SIGNATURE__________________________________  ________________________________________________________________________

## 2014-05-28 NOTE — Pre-Procedure Instructions (Signed)
05-28-14 EKG 9'15, Clearance note (Dr. Claiborne Billings 05-05-14) with chart. Echo 12'13 Epic. CXR done today.

## 2014-05-28 NOTE — Progress Notes (Signed)
05-28-14 1555 Labs viewable in [place]. Note CXR results done - pt. Denies any breathing issues today.

## 2014-06-02 MED ORDER — GENTAMICIN SULFATE 40 MG/ML IJ SOLN
440.0000 mg | INTRAVENOUS | Status: AC
  Administered 2014-06-03: 440 mg via INTRAVENOUS
  Filled 2014-06-02: qty 11

## 2014-06-03 ENCOUNTER — Encounter (HOSPITAL_COMMUNITY): Payer: Self-pay | Admitting: *Deleted

## 2014-06-03 ENCOUNTER — Ambulatory Visit (HOSPITAL_COMMUNITY): Payer: Medicare Other | Admitting: Anesthesiology

## 2014-06-03 ENCOUNTER — Encounter (HOSPITAL_COMMUNITY): Payer: Medicare Other | Admitting: Anesthesiology

## 2014-06-03 ENCOUNTER — Encounter (HOSPITAL_COMMUNITY)
Admission: RE | Disposition: A | Discharge status: Home / Self Care | Payer: Self-pay | Source: Ambulatory Visit | Attending: Urology

## 2014-06-03 ENCOUNTER — Observation Stay (HOSPITAL_COMMUNITY)
Admission: RE | Admit: 2014-06-03 | Discharge: 2014-06-04 | Disposition: A | Discharge status: Home / Self Care | Payer: Medicare Other | Source: Ambulatory Visit | Attending: Urology | Admitting: Urology

## 2014-06-03 DIAGNOSIS — E780 Pure hypercholesterolemia: Secondary | ICD-10-CM | POA: Insufficient documentation

## 2014-06-03 DIAGNOSIS — N529 Male erectile dysfunction, unspecified: Secondary | ICD-10-CM | POA: Diagnosis present

## 2014-06-03 DIAGNOSIS — K219 Gastro-esophageal reflux disease without esophagitis: Secondary | ICD-10-CM | POA: Insufficient documentation

## 2014-06-03 DIAGNOSIS — N401 Enlarged prostate with lower urinary tract symptoms: Secondary | ICD-10-CM | POA: Diagnosis not present

## 2014-06-03 DIAGNOSIS — J449 Chronic obstructive pulmonary disease, unspecified: Secondary | ICD-10-CM | POA: Insufficient documentation

## 2014-06-03 DIAGNOSIS — Z8673 Personal history of transient ischemic attack (TIA), and cerebral infarction without residual deficits: Secondary | ICD-10-CM | POA: Diagnosis not present

## 2014-06-03 DIAGNOSIS — N5201 Erectile dysfunction due to arterial insufficiency: Principal | ICD-10-CM | POA: Insufficient documentation

## 2014-06-03 DIAGNOSIS — Z720 Tobacco use: Secondary | ICD-10-CM | POA: Diagnosis not present

## 2014-06-03 DIAGNOSIS — N138 Other obstructive and reflux uropathy: Secondary | ICD-10-CM | POA: Insufficient documentation

## 2014-06-03 DIAGNOSIS — F419 Anxiety disorder, unspecified: Secondary | ICD-10-CM | POA: Insufficient documentation

## 2014-06-03 DIAGNOSIS — Z8546 Personal history of malignant neoplasm of prostate: Secondary | ICD-10-CM | POA: Diagnosis not present

## 2014-06-03 DIAGNOSIS — I252 Old myocardial infarction: Secondary | ICD-10-CM | POA: Insufficient documentation

## 2014-06-03 DIAGNOSIS — J45909 Unspecified asthma, uncomplicated: Secondary | ICD-10-CM | POA: Insufficient documentation

## 2014-06-03 DIAGNOSIS — G4700 Insomnia, unspecified: Secondary | ICD-10-CM | POA: Diagnosis not present

## 2014-06-03 DIAGNOSIS — I10 Essential (primary) hypertension: Secondary | ICD-10-CM | POA: Insufficient documentation

## 2014-06-03 HISTORY — PX: PENILE PROSTHESIS IMPLANT: SHX240

## 2014-06-03 LAB — GLUCOSE, CAPILLARY
Glucose-Capillary: 158 mg/dL — ABNORMAL HIGH (ref 70–99)
Glucose-Capillary: 122 mg/dL — ABNORMAL HIGH (ref 70–99)

## 2014-06-03 SURGERY — INSERTION, PENILE PROSTHESIS, INFLATABLE
Anesthesia: General

## 2014-06-03 MED ORDER — NEOSTIGMINE METHYLSULFATE 10 MG/10ML IV SOLN
INTRAVENOUS | Status: AC
  Filled 2014-06-03: qty 1

## 2014-06-03 MED ORDER — PROPOFOL 10 MG/ML IV BOLUS
INTRAVENOUS | Status: DC | PRN
  Administered 2014-06-03: 160 mg via INTRAVENOUS

## 2014-06-03 MED ORDER — DOCUSATE SODIUM 100 MG PO CAPS: 100.0000 mg | ORAL_CAPSULE | Freq: Two times a day (BID) | ORAL | Status: DC

## 2014-06-03 MED ORDER — PROMETHAZINE HCL 25 MG/ML IJ SOLN: 6.2500 mg | INTRAMUSCULAR | Status: DC | PRN

## 2014-06-03 MED ORDER — SODIUM CHLORIDE 0.9 % IJ SOLN: 3.0000 mL | INTRAMUSCULAR | Status: DC | PRN

## 2014-06-03 MED ORDER — CIPROFLOXACIN HCL 500 MG PO TABS: 500.0000 mg | ORAL_TABLET | Freq: Two times a day (BID) | ORAL | Status: DC

## 2014-06-03 MED ORDER — BUPIVACAINE HCL (PF) 0.5 % IJ SOLN
INTRAMUSCULAR | Status: AC
  Filled 2014-06-03: qty 30

## 2014-06-03 MED ORDER — MORPHINE SULFATE 2 MG/ML IJ SOLN
2.0000 mg | INTRAMUSCULAR | Status: DC | PRN
  Administered 2014-06-03: 2 mg via INTRAVENOUS
  Filled 2014-06-03: qty 1

## 2014-06-03 MED ORDER — OXYCODONE-ACETAMINOPHEN 5-325 MG PO TABS: 1.0000 | ORAL_TABLET | ORAL | Status: DC | PRN

## 2014-06-03 MED ORDER — FENTANYL CITRATE 0.05 MG/ML IJ SOLN
INTRAMUSCULAR | Status: AC
  Filled 2014-06-03: qty 5

## 2014-06-03 MED ORDER — FENTANYL CITRATE 0.05 MG/ML IJ SOLN
INTRAMUSCULAR | Status: DC | PRN
  Administered 2014-06-03 (×2): 50 ug via INTRAVENOUS

## 2014-06-03 MED ORDER — LIDOCAINE HCL (CARDIAC) 20 MG/ML IV SOLN
INTRAVENOUS | Status: DC | PRN
  Administered 2014-06-03: 50 mg via INTRAVENOUS

## 2014-06-03 MED ORDER — FENTANYL CITRATE 0.05 MG/ML IJ SOLN
25.0000 ug | INTRAMUSCULAR | Status: DC | PRN
  Administered 2014-06-03: 50 ug via INTRAVENOUS

## 2014-06-03 MED ORDER — PHENYLEPHRINE HCL 10 MG/ML IJ SOLN
10.0000 mg | INTRAVENOUS | Status: DC | PRN
  Administered 2014-06-03: 10 ug/min via INTRAVENOUS

## 2014-06-03 MED ORDER — ONDANSETRON HCL 4 MG/2ML IJ SOLN
INTRAMUSCULAR | Status: AC
  Filled 2014-06-03: qty 2

## 2014-06-03 MED ORDER — CHLORHEXIDINE GLUCONATE 4 % EX LIQD: Freq: Once | CUTANEOUS | Status: DC

## 2014-06-03 MED ORDER — CEFAZOLIN SODIUM-DEXTROSE 2-3 GM-% IV SOLR
INTRAVENOUS | Status: AC
  Filled 2014-06-03: qty 50

## 2014-06-03 MED ORDER — LISINOPRIL 20 MG PO TABS
20.0000 mg | ORAL_TABLET | Freq: Two times a day (BID) | ORAL | Status: DC
Start: 2014-06-03 — End: 2014-06-04
  Administered 2014-06-03 – 2014-06-04 (×2): 20 mg via ORAL
  Filled 2014-06-03 (×4): qty 1

## 2014-06-03 MED ORDER — AMLODIPINE BESYLATE 5 MG PO TABS
5.0000 mg | ORAL_TABLET | ORAL | Status: DC
  Administered 2014-06-04: 5 mg via ORAL
  Filled 2014-06-03 (×3): qty 1

## 2014-06-03 MED ORDER — SODIUM CHLORIDE 0.9 % IR SOLN
Status: AC
  Filled 2014-06-03: qty 1

## 2014-06-03 MED ORDER — ONDANSETRON HCL 4 MG/2ML IJ SOLN
INTRAMUSCULAR | Status: DC | PRN
  Administered 2014-06-03: 4 mg via INTRAVENOUS

## 2014-06-03 MED ORDER — ROCURONIUM BROMIDE 100 MG/10ML IV SOLN
INTRAVENOUS | Status: DC | PRN
  Administered 2014-06-03: 10 mg via INTRAVENOUS
  Administered 2014-06-03: 5 mg via INTRAVENOUS
  Administered 2014-06-03: 25 mg via INTRAVENOUS

## 2014-06-03 MED ORDER — CLONAZEPAM 1 MG PO TABS: 1.0000 mg | ORAL_TABLET | Freq: Two times a day (BID) | ORAL | Status: DC | PRN

## 2014-06-03 MED ORDER — CEFAZOLIN SODIUM-DEXTROSE 2-3 GM-% IV SOLR
2.0000 g | INTRAVENOUS | Status: AC
  Administered 2014-06-03: 2 g via INTRAVENOUS

## 2014-06-03 MED ORDER — BELLADONNA ALKALOIDS-OPIUM 16.2-60 MG RE SUPP
RECTAL | Status: AC
  Filled 2014-06-03: qty 1

## 2014-06-03 MED ORDER — DIPHENHYDRAMINE HCL 12.5 MG/5ML PO ELIX: 12.5000 mg | ORAL_SOLUTION | Freq: Four times a day (QID) | ORAL | Status: DC | PRN

## 2014-06-03 MED ORDER — PROPOFOL 10 MG/ML IV BOLUS
INTRAVENOUS | Status: AC
  Filled 2014-06-03: qty 20

## 2014-06-03 MED ORDER — LIDOCAINE HCL (CARDIAC) 20 MG/ML IV SOLN
INTRAVENOUS | Status: AC
  Filled 2014-06-03: qty 5

## 2014-06-03 MED ORDER — TIOTROPIUM BROMIDE MONOHYDRATE 18 MCG IN CAPS
18.0000 ug | ORAL_CAPSULE | Freq: Every day | RESPIRATORY_TRACT | Status: DC
  Administered 2014-06-04: 18 ug via RESPIRATORY_TRACT
  Filled 2014-06-03: qty 5

## 2014-06-03 MED ORDER — OXYCODONE-ACETAMINOPHEN 5-325 MG PO TABS
1.0000 | ORAL_TABLET | ORAL | Status: DC | PRN
  Administered 2014-06-04 (×2): 2 via ORAL
  Filled 2014-06-03 (×2): qty 2

## 2014-06-03 MED ORDER — MIDAZOLAM HCL 2 MG/2ML IJ SOLN
INTRAMUSCULAR | Status: AC
  Filled 2014-06-03: qty 2

## 2014-06-03 MED ORDER — BUPIVACAINE HCL (PF) 0.5 % IJ SOLN
INTRAMUSCULAR | Status: DC | PRN
  Administered 2014-06-03: 20 mL

## 2014-06-03 MED ORDER — SODIUM CHLORIDE 0.9 % IJ SOLN
3.0000 mL | Freq: Two times a day (BID) | INTRAMUSCULAR | Status: DC
  Administered 2014-06-04: 3 mL via INTRAVENOUS

## 2014-06-03 MED ORDER — GLYCOPYRROLATE 0.2 MG/ML IJ SOLN
INTRAMUSCULAR | Status: DC | PRN
  Administered 2014-06-03: 0.6 mg via INTRAVENOUS

## 2014-06-03 MED ORDER — FENTANYL CITRATE 0.05 MG/ML IJ SOLN
INTRAMUSCULAR | Status: AC
  Filled 2014-06-03: qty 2

## 2014-06-03 MED ORDER — ACETAMINOPHEN 325 MG PO TABS: 650.0000 mg | ORAL_TABLET | ORAL | Status: DC | PRN

## 2014-06-03 MED ORDER — LACTATED RINGERS IV SOLN: INTRAVENOUS | Status: DC

## 2014-06-03 MED ORDER — PHENYLEPHRINE HCL 10 MG/ML IJ SOLN
INTRAMUSCULAR | Status: DC | PRN
  Administered 2014-06-03 (×4): 80 ug via INTRAVENOUS

## 2014-06-03 MED ORDER — GLYCOPYRROLATE 0.2 MG/ML IJ SOLN
INTRAMUSCULAR | Status: AC
  Filled 2014-06-03: qty 3

## 2014-06-03 MED ORDER — HYDROCHLOROTHIAZIDE 12.5 MG PO CAPS
25.0000 mg | ORAL_CAPSULE | ORAL | Status: DC
  Filled 2014-06-03: qty 2

## 2014-06-03 MED ORDER — LACTATED RINGERS IV SOLN
INTRAVENOUS | Status: DC | PRN
  Administered 2014-06-03 (×3): via INTRAVENOUS

## 2014-06-03 MED ORDER — CIPROFLOXACIN IN D5W 400 MG/200ML IV SOLN
400.0000 mg | Freq: Two times a day (BID) | INTRAVENOUS | Status: DC
  Administered 2014-06-03 – 2014-06-04 (×2): 400 mg via INTRAVENOUS
  Filled 2014-06-03 (×3): qty 200

## 2014-06-03 MED ORDER — HYDROCHLOROTHIAZIDE 25 MG PO TABS
25.0000 mg | ORAL_TABLET | Freq: Every day | ORAL | Status: DC
  Administered 2014-06-04: 25 mg via ORAL
  Filled 2014-06-03: qty 1

## 2014-06-03 MED ORDER — MEPERIDINE HCL 50 MG/ML IJ SOLN: 6.2500 mg | INTRAMUSCULAR | Status: DC | PRN

## 2014-06-03 MED ORDER — ROCURONIUM BROMIDE 100 MG/10ML IV SOLN
INTRAVENOUS | Status: AC
  Filled 2014-06-03: qty 1

## 2014-06-03 MED ORDER — NAPHAZOLINE HCL 0.1 % OP SOLN
1.0000 [drp] | Freq: Two times a day (BID) | OPHTHALMIC | Status: DC | PRN
  Filled 2014-06-03: qty 15

## 2014-06-03 MED ORDER — NITROGLYCERIN 0.4 MG SL SUBL: 0.4000 mg | SUBLINGUAL_TABLET | SUBLINGUAL | Status: DC | PRN

## 2014-06-03 MED ORDER — BELLADONNA ALKALOIDS-OPIUM 16.2-60 MG RE SUPP
1.0000 | Freq: Four times a day (QID) | RECTAL | Status: DC | PRN
  Administered 2014-06-03 – 2014-06-04 (×2): 1 via RECTAL
  Filled 2014-06-03 (×2): qty 1

## 2014-06-03 MED ORDER — SENNOSIDES-DOCUSATE SODIUM 8.6-50 MG PO TABS
2.0000 | ORAL_TABLET | Freq: Every day | ORAL | Status: DC
  Administered 2014-06-03: 2 via ORAL
  Filled 2014-06-03 (×2): qty 2

## 2014-06-03 MED ORDER — METOPROLOL SUCCINATE ER 100 MG PO TB24
100.0000 mg | ORAL_TABLET | ORAL | Status: DC
Start: 2014-06-04 — End: 2014-06-04
  Administered 2014-06-04: 100 mg via ORAL
  Filled 2014-06-03 (×2): qty 1

## 2014-06-03 MED ORDER — POLYMYXIN B SULFATE 500000 UNITS IJ SOLR
INTRAMUSCULAR | Status: DC | PRN
  Administered 2014-06-03: 11:00:00

## 2014-06-03 MED ORDER — NEOSTIGMINE METHYLSULFATE 10 MG/10ML IV SOLN
INTRAVENOUS | Status: DC | PRN
  Administered 2014-06-03: 4 mg via INTRAVENOUS

## 2014-06-03 MED ORDER — DEXTROSE IN LACTATED RINGERS 5 % IV SOLN
INTRAVENOUS | Status: DC
  Administered 2014-06-03 – 2014-06-04 (×2): via INTRAVENOUS

## 2014-06-03 MED ORDER — SODIUM CHLORIDE 0.9 % IV SOLN: 250.0000 mL | INTRAVENOUS | Status: DC | PRN

## 2014-06-03 MED ORDER — INFLUENZA VAC SPLIT QUAD 0.5 ML IM SUSY
0.5000 mL | PREFILLED_SYRINGE | INTRAMUSCULAR | Status: DC
  Filled 2014-06-03 (×2): qty 0.5

## 2014-06-03 MED ORDER — PNEUMOCOCCAL VAC POLYVALENT 25 MCG/0.5ML IJ INJ
0.5000 mL | INJECTION | INTRAMUSCULAR | Status: DC
  Filled 2014-06-03 (×2): qty 0.5

## 2014-06-03 MED ORDER — MIDAZOLAM HCL 5 MG/5ML IJ SOLN
INTRAMUSCULAR | Status: DC | PRN
  Administered 2014-06-03: 2 mg via INTRAVENOUS

## 2014-06-03 MED ORDER — SUCCINYLCHOLINE CHLORIDE 20 MG/ML IJ SOLN
INTRAMUSCULAR | Status: DC | PRN
  Administered 2014-06-03: 120 mg via INTRAVENOUS

## 2014-06-03 MED ORDER — DIPHENHYDRAMINE HCL 50 MG/ML IJ SOLN: 12.5000 mg | Freq: Four times a day (QID) | INTRAMUSCULAR | Status: DC | PRN

## 2014-06-03 SURGICAL SUPPLY — 59 items
BAG URINE DRAINAGE (UROLOGICAL SUPPLIES) ×2 IMPLANT
BANDAGE COBAN STERILE 2 (GAUZE/BANDAGES/DRESSINGS) IMPLANT
BENZOIN TINCTURE PRP APPL 2/3 (GAUZE/BANDAGES/DRESSINGS) ×2 IMPLANT
BLADE HEX COATED 2.75 (ELECTRODE) ×2 IMPLANT
BNDG GAUZE ELAST 4 BULKY (GAUZE/BANDAGES/DRESSINGS) ×2 IMPLANT
BRIEF STRETCH FOR OB PAD LRG (UNDERPADS AND DIAPERS) ×2 IMPLANT
CANISTER SUCTION 2500CC (MISCELLANEOUS) IMPLANT
CATH FOLEY 2WAY SLVR  5CC 16FR (CATHETERS) ×1
CATH FOLEY 2WAY SLVR 5CC 16FR (CATHETERS) ×1 IMPLANT
COVER MAYO STAND STRL (DRAPES) ×2 IMPLANT
DERMABOND ADVANCED (GAUZE/BANDAGES/DRESSINGS) ×2
DERMABOND ADVANCED .7 DNX12 (GAUZE/BANDAGES/DRESSINGS) ×2 IMPLANT
DISSECTOR ROUND CHERRY 3/8 STR (MISCELLANEOUS) IMPLANT
DRAIN PENROSE .75X.25X12 SILI (WOUND CARE) ×2 IMPLANT
DRAIN PENROSE 18X1/2 LTX STRL (DRAIN) ×2 IMPLANT
DRAPE LAPAROTOMY T 102X78X121 (DRAPES) ×2 IMPLANT
DRAPE UTILITY 15X26 (DRAPE) ×2 IMPLANT
DRSG TEGADERM 4X4.75 (GAUZE/BANDAGES/DRESSINGS) ×2 IMPLANT
GAUZE SPONGE 2X2 8PLY STRL LF (GAUZE/BANDAGES/DRESSINGS) ×1 IMPLANT
GAUZE SPONGE 4X4 12PLY STRL (GAUZE/BANDAGES/DRESSINGS) ×2 IMPLANT
GLOVE BIOGEL M STRL SZ7.5 (GLOVE) ×2 IMPLANT
GOWN STRL REUS W/TWL XL LVL3 (GOWN DISPOSABLE) ×6 IMPLANT
KIT BASIN OR (CUSTOM PROCEDURE TRAY) ×2 IMPLANT
KIT TITAN ASSEMBLY (Erectile Restoration) ×1 IMPLANT
KIT TITAN ASSEMBLY STANDARD (Erectile Restoration) ×1 IMPLANT
KIT TITAN ASSEMBLY STD (Erectile Restoration) ×1 IMPLANT
MANIFOLD NEPTUNE II (INSTRUMENTS) ×2 IMPLANT
NDL SAFETY ECLIPSE 18X1.5 (NEEDLE) ×1 IMPLANT
NEEDLE HYPO 18GX1.5 SHARP (NEEDLE) ×1
NEEDLE HYPO 22GX1.5 SAFETY (NEEDLE) ×2 IMPLANT
NS IRRIG 1000ML POUR BTL (IV SOLUTION) ×2 IMPLANT
PACK GENERAL/GYN (CUSTOM PROCEDURE TRAY) ×2 IMPLANT
PLUG CATH AND CAP STER (CATHETERS) ×2 IMPLANT
PROS TITAN SCROT 0 ANG 20CM (Erectile Restoration) ×2 IMPLANT
PROSTHESIS TTN SCRO 0 ANG 20CM (Erectile Restoration) ×1 IMPLANT
RESERVOIR 75CC LOCKOUT BIOFLEX (Erectile Restoration) ×2 IMPLANT
RETRACTOR WILSON SYSTEM (INSTRUMENTS) IMPLANT
SCRUB PCMX 4 OZ (MISCELLANEOUS) ×2 IMPLANT
SOL PREP POV-IOD 4OZ 10% (MISCELLANEOUS) ×2 IMPLANT
SOL PREP PROV IODINE SCRUB 4OZ (MISCELLANEOUS) ×2 IMPLANT
SPONGE GAUZE 2X2 STER 10/PKG (GAUZE/BANDAGES/DRESSINGS) ×1
SPONGE LAP 4X18 X RAY DECT (DISPOSABLE) IMPLANT
STAPLER VISISTAT 35W (STAPLE) ×2 IMPLANT
STRIP CLOSURE SKIN 1/2X4 (GAUZE/BANDAGES/DRESSINGS) ×2 IMPLANT
SURGILUBE 3G PEEL PACK STRL (MISCELLANEOUS) ×10 IMPLANT
SUT MNCRL AB 4-0 PS2 18 (SUTURE) ×2 IMPLANT
SUT VIC AB 2-0 SH 27 (SUTURE) ×1
SUT VIC AB 2-0 SH 27X BRD (SUTURE) ×1 IMPLANT
SUT VIC AB 2-0 UR6 27 (SUTURE) ×8 IMPLANT
SUT VIC AB 3-0 54XBRD REEL (SUTURE) ×1 IMPLANT
SUT VIC AB 3-0 BRD 54 (SUTURE) ×1
SUT VIC AB 3-0 SH 27 (SUTURE) ×1
SUT VIC AB 3-0 SH 27X BRD (SUTURE) ×1 IMPLANT
SYR 20CC LL (SYRINGE) ×2 IMPLANT
SYR 30ML LL (SYRINGE) ×4 IMPLANT
SYR 50ML LL SCALE MARK (SYRINGE) ×4 IMPLANT
TOWEL OR 17X26 10 PK STRL BLUE (TOWEL DISPOSABLE) ×4 IMPLANT
TOWEL OR NON WOVEN STRL DISP B (DISPOSABLE) ×2 IMPLANT
WATER STERILE IRR 1500ML POUR (IV SOLUTION) ×2 IMPLANT

## 2014-06-03 NOTE — H&P (Signed)
Reason For Visit Discuss IPP   Active Problems Problems  1. Benign localized hyperplasia of prostate with urinary obstruction  [id number]) 2. 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 Family history of Prostate Cancer (V16.42)   father and two uncles  History of Present Illness    68 YO male patient returns today to discuss IPP. Hx of BPH & ED. He states that Trimix injections did not work. He had an MI in Dec. 2013. Very frustrated w/ ED and is now wanting to proceed with prosthesis (girlfriend is 36 YO and still wanting to be sexually active).      Originally referred by Dr. Clearence Cheek for further evaluation of ED. He has previously failed Viagra & Cialis. Levitra was too expensive.     Hx of incomplete emptying, urgency, frequency, intermittent, weak flow & nocturia x 4. Family hx significant for father had prostate cancer & 2 uncles with prostate cancer.      He is c/o nocturia x 5. He has a hx of 12 beer/day x 30+ yrs, but not much x 5 yrs. (Hx DUIs, and fighting).    [name] died recently from unknown causes-autopsy pending.   Past Medical History Problems  1. History of Anxiety (300.00) 2. History of Asthma (493.90) 3. 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 History of Chronic obstructive pulmonary disease (496) 5. History of esophageal reflux (V12.79) 6. History of hypercholesterolemia (V12.29) 7. History of hypertension (V12.59) 8. History of insomnia (V13.89) 9. History of low back pain (V13.59) 10. History of myocardial infarction (412) 11. History of Stroke syndrome (434.91) 12. History of Upper back pain (724.5)  Surgical History Problems  1. History of Cath Stent Placement 2. History of Hand Surgery 3. History of Knee Surgery Left  Current Meds 1. AmLODIPine Besylate 5 MG Oral Tablet;  Therapy: (Recorded:02Jul2013) to Recorded 2. Atorvastatin Calcium 80 MG Oral Tablet;  Therapy: [id number] to Recorded 3. ClonazePAM 1 MG Oral  Tablet Dispersible;  Therapy: (Recorded:02Jul2013) to Recorded 4. Hydrochlorothiazide 12.5 MG Oral Capsule;  Therapy: (Recorded:02Jul2013) to Recorded 5. Lisinopril 40 MG Oral Tablet;  Therapy: (Recorded:02Jul2013) to Recorded 6. Metoprolol Tartrate 100 MG Oral Tablet;  Therapy: (Recorded:02Jul2013) to Recorded 7. Nitrostat 0.4 MG Sublingual Tablet Sublingual;  Therapy: [id number] to Recorded 8. Oxycodone-Acetaminophen 10-325 MG Oral Tablet;  Therapy: (Recorded:02Jul2013) to Recorded 9. ProAir HFA 108 (90 Base) MCG/ACT Inhalation Aerosol Solution;  Therapy: 24Oct2013 to Recorded 10. Spiriva HandiHaler 18 MCG Inhalation Capsule;   Therapy: 24Oct2013 to Recorded  Allergies Medication  1. No Known Drug Allergies  Family History Problems  1. Family history of Family Health Status - Mother's Age 18. Family history of Family Health Status Number Of Children 3. Family history of Father Deceased At Age 57   heart attack 4. Family history of Prostate Cancer (V16.42)   father and two uncles  Social History Problems  1. Denied: History of Alcohol Use 2. Caffeine Use   2-4 per day 3. Former smoker (V15.82)   1 ppd 4. Marital History - Divorced (V61.03)  Review of Systems Genitourinary, constitutional, skin, eye, otolaryngeal, hematologic/lymphatic, cardiovascular, pulmonary, endocrine, musculoskeletal, gastrointestinal, neurological and psychiatric system(s) were reviewed and pertinent findings if present are noted.  Genitourinary: feelings of urinary urgency, nocturia and erectile dysfunction, but no hematuria and no urethral discharge.  Eyes: blurred vision.  Musculoskeletal: back pain and joint pain.  Psychiatric: depression.    Vitals Vital Signs [Data Includes: Last 1 Day]  Recorded: 02Sep2015 11:14AM  Height: 5 ft 9 in  Weight: 250 lb  BMI Calculated: 36.92 BSA Calculated: 2.27 Blood Pressure: 150 / 80 Heart Rate: 94  Physical Exam Constitutional: Well nourished  and well developed . No acute distress.  ENT:. The ears and nose are normal in appearance.  Neck: The appearance of the neck is normal and no neck mass is present.  Pulmonary: No respiratory distress and normal respiratory rhythm and effort . COPD.  Cardiovascular: Heart rate and rhythm are normal . No peripheral edema.  Abdomen: The abdomen is obese, but not distended. The abdomen is soft and nontender. No masses are palpated. The abdomen is not firm, no rebound and no guarding. Bowel sounds are normal.  Rectal: Rectal exam demonstrates normal sphincter tone, no tenderness and no masses. The prostate has no nodularity and is not tender. The left seminal vesicle is nonpalpable. The right seminal vesicle is nonpalpable. The perineum is normal on inspection.  Genitourinary: The penis is uncircumcised. Examination of the right scrotum demonstrates a hydrocele. Examination of the left scrotum demostrates a hydrocele. The right vas deferens is difficult to palpate. The left vas deferens is difficult to palpate. The right testis is palpably normal. The left testis is normal.  Lymphatics: The femoral and inguinal nodes are not enlarged or tender.  Skin: Normal skin turgor, no visible rash and no visible skin lesions.  Neuro/Psych:. Mood and affect are appropriate.    Results/Data  Urine [Data Includes: Last 1 Day]   02Sep2015  COLOR YELLOW   APPEARANCE CLEAR   SPECIFIC GRAVITY 1.010   pH 6.0   GLUCOSE NEG mg/dL  BILIRUBIN NEG   KETONE NEG mg/dL  BLOOD NEG   PROTEIN NEG mg/dL  UROBILINOGEN 0.2 mg/dL  NITRITE NEG   LEUKOCYTE ESTERASE TRACE   SQUAMOUS EPITHELIAL/HPF RARE   WBC 0-2 WBC/hpf  RBC 0-2 RBC/hpf  BACTERIA RARE   CRYSTALS NONE SEEN   CASTS NONE SEEN    05 May 2014 11:07 AM  UA With REFLEX    COLOR YELLOW     APPEARANCE CLEAR     SPECIFIC GRAVITY 1.010     pH 6.0     GLUCOSE NEG     BILIRUBIN NEG     KETONE NEG     BLOOD NEG     PROTEIN NEG     UROBILINOGEN 0.2     NITRITE  NEG     LEUKOCYTE ESTERASE TRACE     SQUAMOUS EPITHELIAL/HPF RARE     WBC 0-2     CRYSTALS NONE SEEN     CASTS NONE SEEN     RBC 0-2     BACTERIA RARE   Assessment Assessed  1. 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 History of myocardial infarction (412) 4. 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 He has 60 yo girlfriend. We have discussed surgery, and potentioa compication of infection. He is still interested in surgery. My concern is for his cardiac history, his tobacco history ( he reassures me that he does NOT need a nicotine path), and his ETOH history ( does not believe he is ETOH dependent).    He will need approval from Dr. Claiborne Billings. ( Cardiology).   Plan Erectile dysfunction due to arterial insufficiency  1. Follow-up Schedule Surgery Office  Follow-up  Status: Complete  Done:  [id number]  OK for IPP if he is ok'd by Cardiology from stress test.  Post op restrictions of riding lawnmower  and tractor discusse with patient.   Signatures Electronically signed by : Carolan Clines, M.D.; May 05 2014 12:49PM EST

## 2014-06-03 NOTE — Anesthesia Preprocedure Evaluation (Addendum)
Anesthesia Evaluation  Patient identified by MRN, date of birth, ID band Patient awake    Reviewed: Allergy & Precautions, H&P , NPO status , Patient's Chart, lab work & pertinent test results  Airway Mallampati: II TM Distance: >3 FB Neck ROM: Full    Dental no notable dental hx.    Pulmonary COPDCurrent Smoker,  breath sounds clear to auscultation  Pulmonary exam normal       Cardiovascular hypertension, Pt. on medications + CAD, + Past MI and + Cardiac Stents Rhythm:Regular Rate:Normal  Pulmonary HTN   Neuro/Psych negative neurological ROS  negative psych ROS   GI/Hepatic negative GI ROS, Neg liver ROS,   Endo/Other  diabetes, Type 2, Oral Hypoglycemic Agents  Renal/GU negative Renal ROS  negative genitourinary   Musculoskeletal negative musculoskeletal ROS (+)   Abdominal   Peds negative pediatric ROS (+)  Hematology negative hematology ROS (+)   Anesthesia Other Findings   Reproductive/Obstetrics negative OB ROS                          Anesthesia Physical Anesthesia Plan  ASA: III  Anesthesia Plan: General   Post-op Pain Management:    Induction: Intravenous  Airway Management Planned: Oral ETT  Additional Equipment:   Intra-op Plan:   Post-operative Plan: Extubation in OR and Possible Post-op intubation/ventilation  Informed Consent: I have reviewed the patients History and Physical, chart, labs and discussed the procedure including the risks, benefits and alternatives for the proposed anesthesia with the patient or authorized representative who has indicated his/her understanding and acceptance.   Dental advisory given  Plan Discussed with: CRNA  Anesthesia Plan Comments:         Anesthesia Quick Evaluation

## 2014-06-03 NOTE — Op Note (Signed)
Preoperative diagnosis:  1. Erectile dysfunction, vasogenic origin due to arterial insufficiency  Postoperative diagnosis: 1. Same  Procedure(s): 1. Implantation of 3-piece inflatable penile prosthesis  Surgeon: Dr. Carolan Clines  Resident: Dr. Milon Score  Anesthesia: General  Complications: None  EBL: 43mL  Specimens: None  Intraoperative findings: No corporal fibrosis. 11cm proximal and 9cm distally bilaterally. 20+1 cm device implanted. Good positioning of the cylinders distally in the mid glans. No ST deformity. No buckling proximally. Good position of the reservoir with 55mL. Pump in good location in the dependent right hemiscrotum.  Indication: Refractory ED. Risks, benefits and alternative discussed in detail. Patient elects for IPP after full discussion of risks including but not limited to infection of the device requiring explantation, wound infection hematoma, pain, dissatisfaction with device, poor cosmetic result, malfunctioning of the device, damage to the uretehra.  Description of procedure:  Patient was verified in holding and brought to the OR and anesthesia was induces. He was placed in supine position, clipped and prepped with 10 minutes prep and betadine. He was then draped in the usual fashion. Time out was called and a dorsal penile and ring block were performed. Sterile saline was injected into the corpora bilaterally. A foley was placed and a meatal hook was placed using the lone star retractor. A midline raphe incision was made and dissection was carried down until blunt dissection revealed the corpora bilaterally. The urethra was protected as 2-0 Vicryl stay sutures were placed bilaterally. Corporotomies were made and dilation was performed without difficulty. The measuring device was used and measurements are as above.   The reservoir was then placed retropubically without complication in the standard fashion. It was inflated with 38mL. The device  was then prepared on the back table and inserted using the furlough and Lanny Hurst needle. The device sat in good position bilaterally. 11mL of saline were injected into the cylinders and the device inflated appropriately. All but 10cc were removed. The subdartos pouch was then created and the dartos was closed over top of the pump to keep it from riding up. Additional 2-0 Vicryls were placed to close the corporotomies in a running fashion. The connector set was used to connect the tubing. The dartos was closed with a 2-0 running vicryl and the skin with 4-0 monocryl. Dermabond was applied. The patient was awoken from anesthesia having tolerated the procedure well.  Dr. [name] was present and scrubbed for the entire case.

## 2014-06-03 NOTE — Transfer of Care (Signed)
Immediate Anesthesia Transfer of Care Note  Patient: Bobby [name]  Procedure(s) Performed: Procedure(s) with comments: IMPLANT PENILE PROTHESIS INFLATABLE (N/A) - with penile block--0.5% marcaine plain  Patient Location: PACU  Anesthesia Type:General  Level of Consciousness: awake, alert  and oriented  Airway & Oxygen Therapy: Patient Spontanous Breathing and Patient connected to face mask oxygen  Post-op Assessment: Report given to PACU RN and Post -op Vital signs reviewed and stable  Post vital signs: Reviewed and stable  Complications: No apparent anesthesia complications

## 2014-06-03 NOTE — Interval H&P Note (Signed)
History and Physical Interval Note:  06/03/2014 9:08 AM  Bobby [name]  has presented today for surgery, with the diagnosis of ERECTILE DISFUNCTION  The various methods of treatment have been discussed with the patient and family. After consideration of risks, benefits and other options for treatment, the patient has consented to  Procedure(s): [place] (N/A) as a surgical intervention .  The patient's history has been reviewed, patient examined, no change in status, stable for surgery.  I have reviewed the patient's chart and labs.  Questions were answered to the patient's satisfaction.     Carolan Clines I

## 2014-06-03 NOTE — Anesthesia Postprocedure Evaluation (Signed)
  Anesthesia Post-op Note  Patient: Bobby [name]  Procedure(s) Performed: Procedure(s) (LRB): IMPLANT PENILE PROTHESIS INFLATABLE (N/A)  Patient Location: PACU  Anesthesia Type: General  Level of Consciousness: awake and alert   Airway and Oxygen Therapy: Patient Spontanous Breathing  Post-op Pain: mild  Post-op Assessment: Post-op Vital signs reviewed, Patient's Cardiovascular Status Stable, Respiratory Function Stable, Patent Airway and No signs of Nausea or vomiting  Last Vitals:  Filed Vitals:   06/03/14 1400  BP: 133/54  Pulse: 75  Temp:   Resp: 15    Post-op Vital Signs: stable   Complications: No apparent anesthesia complications

## 2014-06-04 ENCOUNTER — Encounter (HOSPITAL_COMMUNITY): Payer: Self-pay | Admitting: Urology

## 2014-06-04 DIAGNOSIS — N5201 Erectile dysfunction due to arterial insufficiency: Secondary | ICD-10-CM | POA: Diagnosis not present

## 2014-06-04 LAB — CBC
HCT: 38.8 % — ABNORMAL LOW (ref 39.0–52.0)
Hemoglobin: 12.5 g/dL — ABNORMAL LOW (ref 13.0–17.0)
MCH: 28.9 pg (ref 26.0–34.0)
MCHC: 32.2 g/dL (ref 30.0–36.0)
MCV: 89.8 fL (ref 78.0–100.0)
PLATELETS: 183 10*3/uL (ref 150–400)
RBC: 4.32 MIL/uL (ref 4.22–5.81)
RDW: 14.7 % (ref 11.5–15.5)
WBC: 14.7 10*3/uL — ABNORMAL HIGH (ref 4.0–10.5)

## 2014-06-04 LAB — BASIC METABOLIC PANEL
ANION GAP: 12 (ref 5–15)
BUN: 12 mg/dL (ref 6–23)
CHLORIDE: 97 meq/L (ref 96–112)
CO2: 26 meq/L (ref 19–32)
Calcium: 8.6 mg/dL (ref 8.4–10.5)
Creatinine, Ser: 1.2 mg/dL (ref 0.50–1.35)
GFR calc non Af Amer: 60 mL/min — ABNORMAL LOW (ref >90)
GFR, EST AFRICAN AMERICAN: 70 mL/min — AB (ref >90)
Glucose, Bld: 179 mg/dL — ABNORMAL HIGH (ref 70–99)
POTASSIUM: 4.1 meq/L (ref 3.7–5.3)
Sodium: 135 mEq/L — ABNORMAL LOW (ref 137–147)

## 2014-06-04 NOTE — Progress Notes (Signed)
Pt frequently desats while sleeping to mid 80's on 4L [place]. When continuous pulse ox beeps, pt wakes up and and breathes normally and sats return to low 90's. Will continue to monitor. Carnella [place] I

## 2014-06-04 NOTE — Op Note (Signed)
Urologic Attending note: Primary Surgeon in attendance. Agree with Dr. Durenda Age note.

## 2014-06-04 NOTE — Progress Notes (Signed)
1 Day Post-Op Subjective: Doing well overall. Pain is controlled. Has been up and out of bed but minimal ambulation. Tolerating clear diet. No nausea/vomiting. Asking for regular food.   Objective: Vital signs in last 24 hours: Temp:  [97.4 F (36.3 C)-99 F (37.2 C)] 98.3 F (36.8 C) (10/02 0427) Pulse Rate:  [72-105] 101 (10/02 0700) Resp:  [13-19] 18 (10/02 0700) BP: (118-172)/(54-74) 153/73 mmHg (10/02 0700) SpO2:  [91 %-100 %] 92 % (10/02 0700)  Intake/Output from previous day: 10/01 0701 - 10/02 0700 In: 4600 [P.O.:600; I.V.:3800; IV Piggyback:200] Out: 3000 [Urine:2925; Blood:75] Intake/Output this shift:    Physical Exam:  General: Alert and oriented CV: RRR Lungs: audible wheezing Abdomen: Soft, ND Incisions: c/d/i Scrotum: expected edema and brusing Ext: NT, No erythema  Lab Results:  Recent Labs  06/04/14 0410  HGB 12.5*  HCT 38.8*   BMET  Recent Labs  06/04/14 0410  NA 135*  K 4.1  CL 97  CO2 26  GLUCOSE 179*  BUN 12  CREATININE 1.20  CALCIUM 8.6     Studies/Results: No results found.  Assessment/Plan: POD#1 from IPP. Doing well.  -- continue pain regimen -- wean O2, likely at baseline. No subjective SOB -- ambulate today -- d/c foley and trial of void prior to D/C -- likely d/c this afternoon    LOS: 1 day   Cherylynn Liszewski C 06/04/2014, 7:44 AM

## 2014-06-04 NOTE — Progress Notes (Signed)
Urology Progress Note  1 Day Post-Op   Subjective: Pod 1    No acute urologic events overnight. Ambulation:   positive Flatus:    positive Bowel movement  negative  Pain: some relief. TAKING PERCOCET WITHOUT INCIDENT.   Objective:  Blood pressure 153/73, pulse 101, temperature 98.3 F (36.8 C), temperature source Oral, resp. rate 18, height 5\' 9"  (1.753 m), weight 116.574 kg (257 lb), SpO2 92.00%.  Physical Exam:  General:  No acute distress, awake Resp: clear to auscultation bilaterally Genitourinary:   Bruising. Mod swelling.  Foley:  Out. Has not voided yet.     I/O last 3 completed shifts: In: 4600 [P.O.:600; I.V.:3800; IV Piggyback:200] Out: 3000 [Urine:2925; Blood:75]  Recent Labs     06/04/14  0410  HGB  12.5*  WBC  14.7*  PLT  183    Recent Labs     06/04/14  0410  NA  135*  K  4.1  CL  97  CO2  26  BUN  12  CREATININE  1.20  CALCIUM  8.6  GFRNONAA  60*  GFRAA  70*     No results found for this basename: PT, INR, APTT,  in the last 72 hours   No components found with this basename: ABG,   Assessment/Plan:  Wound care discussed. Void prior to d/c Semi-fowler's position at home.  Antibiotic and pain med at home [facility] to shower.  Anticipate 3 weeks bruising and 6 weeks of swelling.

## 2014-06-04 NOTE — Progress Notes (Signed)
Dr Wynetta Emery present and aware pt O2 sat dropped to 83% on RA and required 3lnc to increase to 93%. Pt states he "wakes myself up at night from snoring". Pt denies any dyspnea/SOB even when ambulating though 02 sat is low.  Using IS effectively to 1000-1250. Per Dr Wynetta Emery this is likely pt's baseline and pt okay for d/c.

## 2014-06-04 NOTE — Progress Notes (Signed)
UR completed 

## 2014-06-04 NOTE — Discharge Instructions (Signed)
Penile Prosthesis Implantation  Penile prosthesis implantation is a procedure to implant semirigid or inflatable devices into the penis as a treatment for erectile dysfunction. There are two main types of implants used for the surgery.   Inflatable penile implant (also known as hydraulic implant).  This consists of cylinders, a pump, and a reservoir.  There are different types of inflatable implants. Each has advantages and disadvantages.  The cylinders can be inflated with fluid that helps in having an erection and can then be deflated after intercourse.  RISKS AND COMPLICATIONS  Generally, penile prosthesis implantation is a safe procedure. However, as with any procedure, complications can occur. Possible complications include:  Your urethra may be injured during the procedure.  You may develop an infection in your penis. Your prosthesis may need to be removed to treat the infection.  In very rare cases, the blood flow to your penis may become compromised, and the prosthesis would need to be removed. In people with diabetes mellitus, this may result in partial loss of penile tissue.  PROCEDURE  A small incision is made in the scrotum or in the penis just below the head.  The cylinders of the prosthesis are inserted by the surgeon into the erectile tissue of the penis.  When an inflatable prosthesis is inserted, additional small incisions are made in the abdomen and in the scrotum to insert the pump and the reservoir of the inflatable device.  The cylinders, reservoir (filled with fluid), and pump will be joined by tubes and tested before your incisions are closed.  The incisions are closed using dissolvable sutures. AFTER THE PROCEDURE   After the procedure, you will be taken to the recovery area, where a nurse will watch you and check your progress. Once you are awake, stable, and taking fluids well, without other problems, you will be allowed to go home.  The surgeon will  prescribe medicines to ease pain.  You have been given antibiotics.  You may be put on a clear liquid diet for the first 24 hours. If you are doing fine with clear liquids, you may eat whatever you want. Anesthesia can make you nauseated for up to 24 hours.  You may be asked to walk or sit instead of lying down. It is helpful to avoid long periods of immobility.  A towel roll may be placed under your scrotum to help reduce swelling.  The Foley catheter may be removed in the morning after surgery. If you go home the same day, you will need to come to clinic to get the foley removed.  You may shower the day after surgery. Do not scrub incision. Pat dry.  A sterile dressing will be applied to the incision site. You may have a scrotal support. This elevates the scrotum, relieving pressure on the incision site. In those cases in which the scrotal support irritates the incision site, you may remove the support. It is okay if the dressing comes off, especially at night. Air will help a scab to form, which will eliminate the need for dressings during the day.  Call the clinic for increasing pain, redness, warmth, or pus draining from the incision. Also call with fevers >101.5, nausea and vomiting.    Penile Prosthesis Implantation Penile prosthesis implantation is a procedure to implant semirigid or inflatable devices into the penis as a treatment for erectile dysfunction. There are two main types of implants used for the surgery.  Malleable penile implant (also known as non-hydraulic or semirigid implant).  This consists of two silicone rubber rods.  These help by providing a certain amount of rigidity.  They are also flexible, so the penis can either be curved downward in the normal position or put into an erect position for intercourse.  Inflatable penile implant (also known as hydraulic implant).  This consists of cylinders, a pump, and a reservoir.  There are different types of  inflatable implants. Each has advantages and disadvantages.  The cylinders can be inflated with fluid that helps in having an erection and can then be deflated after intercourse. LET [facility] CARE PROVIDER KNOW ABOUT:  Any allergies that you have.  All medicines you are taking, including vitamins, herbs, eye drops, creams, and over-the-counter medicines.  Previous problems you or members of your family have had with the use of anesthetics.  Any blood disorders you have.  Previous surgeries you have had.  Medical conditions you have. RISKS AND COMPLICATIONS  Generally, penile prosthesis implantation is a safe procedure. However, as with any procedure, complications can occur. Possible complications include:  Your urethra may be injured during the procedure.  You may develop an infection in your penis. Your prosthesis may need to be removed to treat the infection.  In very rare cases, the blood flow to your penis may become compromised, and the prosthesis would need to be removed. In people with diabetes mellitus, this may result in partial loss of penile tissue. BEFORE THE PROCEDURE   You may be asked to shower with a special soap the night or morning before surgery.  You may be given intravenous antibiotics one hour before the surgery.  Hair will be removed from your surgical site and the site will be thoroughly cleansed.  You may be given regional anesthetic or general anesthetic.  On the day of the surgery, a Foley catheter (a soft, thin, flexible rubber tube) may be passed through the urethra (urinary passage) into the bladder.  The tube will drain urine.  The catheter will also help in guiding your surgeon to identify the urethra during the procedure. PROCEDURE  A small incision is made in the scrotum or in the penis just below the head.  The cylinders of the prosthesis are inserted by the surgeon into the erectile tissue of the penis.  When an inflatable  prosthesis is inserted, additional small incisions are made in the abdomen and in the scrotum to insert the pump and the reservoir of the inflatable device.  The cylinders, reservoir (filled with fluid), and pump will be joined by tubes and tested before your incisions are closed.  The incisions are closed using dissolvable sutures. AFTER THE PROCEDURE   After the procedure, you will be taken to the recovery area, where a nurse will watch you and check your progress. Once you are awake, stable, and taking fluids well, without other problems, you will be allowed to go home.  The surgeon may prescribe medicines to ease pain.  You may also be given antibiotics.  You may be put on a clear liquid diet for the first 24 hours.  You may be asked to walk or sit instead of lying down. It is helpful to avoid long periods of immobility.  A towel roll may be placed under your scrotum to help reduce swelling.  The Foley catheter may be removed in the morning after surgery.  A sterile dressing will be applied to the incision site. You may have a scrotal support. This elevates the scrotum, relieving pressure on the incision  site. In those cases in which the scrotal support irritates the incision site, you may remove the support. It is okay if the dressing comes off, especially at night. Air will help a scab to form, which will eliminate the need for dressings during the day. Document Released: 11/27/2007 Document Revised: 01/04/2014 Document Reviewed: 01/21/2013 [facility] Patient Information 2015 [place], [place]. This information is not intended to replace advice given to you by your health care provider. Make sure you discuss any questions you have with your health care provider.

## 2014-06-04 NOTE — Progress Notes (Signed)
D/C instructions reviewed w/ pt. Pt verbalizes understanding and all questions answered. Pt D/C in stable condition in w/c by NT to friend's car. Pt in possession of d/c instructions, scripts, and all personal belongings.

## 2014-06-04 NOTE — Discharge Summary (Signed)
Physician Discharge Summary  Patient ID: Bobby [name] MRN: [id number] DOB/AGE: 03-Jan-1946 68 y.o.  Admit date: 06/03/2014 Discharge date: 06/04/2014  Admission Diagnoses:  Discharge Diagnoses:  Active Problems:   Erectile dysfunction   Discharged Condition: stable  Hospital Course: Patient admitted after IPP placement on 10/1. He tolerated this well. His diet was advanced to regular and his pain was controlled with PO pain meds. His foley was removed on POD#1 and he was able to void.  Consults: None  Treatments: surgery: IPP placement  Discharge Exam: Blood pressure 153/73, pulse 101, temperature 98.3 F (36.8 C), temperature source Oral, resp. rate 18, height 5\' 9"  (1.753 m), weight 116.574 kg (257 lb), SpO2 92.00%. AAOx3, in NAD, incision c/d/i, expected scrotal edema and ecchymosis.   Disposition: 01-Home or Self Care  Discharge Instructions   Care order/instruction    Complete by:  As directed   Resume aspirin and fish oil in 5 days            Medication List    STOP taking these medications       aspirin 81 MG chewable tablet     fish oil-omega-3 fatty acids 1000 MG capsule      TAKE these medications       albuterol (2.5 MG/3ML) 0.083% nebulizer solution  Commonly known as:  PROVENTIL  Take 5 mg by nebulization every 4 (four) hours as needed. For wheezing or SOB     amLODipine 5 MG tablet  Commonly known as:  NORVASC  Take 5 mg by mouth every morning.     ciprofloxacin 500 MG tablet  Commonly known as:  CIPRO  Take 1 tablet (500 mg total) by mouth 2 (two) times daily.     clonazePAM 1 MG tablet  Commonly known as:  KLONOPIN  Take 1 mg by mouth 2 (two) times daily as needed for anxiety.     docusate sodium 100 MG capsule  Commonly known as:  COLACE  Take 1 capsule (100 mg total) by mouth 2 (two) times daily.     hydrochlorothiazide 12.5 MG capsule  Commonly known as:  MICROZIDE  Take 25 mg by mouth every morning.     lisinopril 20 MG tablet   Commonly known as:  PRINIVIL,ZESTRIL  Take 1 tablet (20 mg total) by mouth 2 (two) times daily.     metoprolol succinate 100 MG 24 hr tablet  Commonly known as:  TOPROL-XL  Take 100 mg by mouth every morning. Take with or immediately following a meal.     nitroGLYCERIN 0.4 MG SL tablet  Commonly known as:  NITROSTAT  Place 1 tablet (0.4 mg total) under the tongue every 5 (five) minutes x 3 doses as needed for chest pain.     oxyCODONE-acetaminophen 10-325 MG per tablet  Commonly known as:  PERCOCET  Take 1 tablet by mouth every 8 (eight) hours as needed for pain.     oxyCODONE-acetaminophen 5-325 MG per tablet  Commonly known as:  ROXICET  Take 1-2 tablets by mouth every 4 (four) hours as needed for severe pain.     ticagrelor 90 MG Tabs tablet  Commonly known as:  BRILINTA  Take 90 mg by mouth 2 (two) times daily.     tiotropium 18 MCG inhalation capsule  Commonly known as:  SPIRIVA  Place 18 mcg into inhaler and inhale daily.     VISINE 0.05 % ophthalmic solution  Generic drug:  tetrahydrozoline  Place 1 drop into both eyes once  as needed (dry/red eyes.).           Follow-up Information   Follow up with Carolan Clines I, MD. Call today. (Make follow up appointment in 1 week to check wounds.)    Specialty:  Urology   Contact information:   [place] [address] [phone]       Follow up with Carolan Clines I, MD. Call today. (If you go home today, you will need to call to make appointment to have your foley catheter removed on Friday 10/2. If you stay in the hospital, you will have it removed before you leave.)    Specialty:  Urology   Contact information:   [place] [address] [phone]       Signed: Milon Score 06/04/2014, 7:47 AM

## 2014-06-07 NOTE — Discharge Summary (Signed)
Pt seen on AM of d/c, and agree with Dr. Wynetta Emery Assesment ans plan for discharge.

## 2014-07-01 DIAGNOSIS — F329 Major depressive disorder, single episode, unspecified: Secondary | ICD-10-CM | POA: Insufficient documentation

## 2014-07-01 DIAGNOSIS — F32A Depression, unspecified: Secondary | ICD-10-CM | POA: Insufficient documentation

## 2014-07-01 DIAGNOSIS — F419 Anxiety disorder, unspecified: Secondary | ICD-10-CM | POA: Insufficient documentation

## 2014-07-01 DIAGNOSIS — Z634 Disappearance and death of family member: Secondary | ICD-10-CM

## 2014-07-01 DIAGNOSIS — I252 Old myocardial infarction: Secondary | ICD-10-CM | POA: Insufficient documentation

## 2014-07-01 DIAGNOSIS — F4321 Adjustment disorder with depressed mood: Secondary | ICD-10-CM | POA: Insufficient documentation

## 2014-08-12 ENCOUNTER — Encounter (HOSPITAL_COMMUNITY): Payer: Self-pay | Admitting: Cardiovascular Disease

## 2015-03-03 ENCOUNTER — Ambulatory Visit (INDEPENDENT_AMBULATORY_CARE_PROVIDER_SITE_OTHER): Payer: Medicare Other | Admitting: Cardiovascular Disease

## 2015-03-03 ENCOUNTER — Encounter: Payer: Self-pay | Admitting: Cardiovascular Disease

## 2015-03-03 VITALS — BP 110/60 | HR 57 | Ht 69.0 in | Wt 235.0 lb

## 2015-03-03 DIAGNOSIS — Z720 Tobacco use: Secondary | ICD-10-CM

## 2015-03-03 DIAGNOSIS — I2583 Coronary atherosclerosis due to lipid rich plaque: Principal | ICD-10-CM

## 2015-03-03 DIAGNOSIS — I10 Essential (primary) hypertension: Secondary | ICD-10-CM

## 2015-03-03 DIAGNOSIS — I4510 Unspecified right bundle-branch block: Secondary | ICD-10-CM | POA: Diagnosis not present

## 2015-03-03 DIAGNOSIS — Z955 Presence of coronary angioplasty implant and graft: Secondary | ICD-10-CM | POA: Diagnosis not present

## 2015-03-03 DIAGNOSIS — I2510 Atherosclerotic heart disease of native coronary artery without angina pectoris: Secondary | ICD-10-CM

## 2015-03-03 DIAGNOSIS — E785 Hyperlipidemia, unspecified: Secondary | ICD-10-CM

## 2015-03-03 MED ORDER — TICAGRELOR 60 MG PO TABS: 60.0000 mg | ORAL_TABLET | Freq: Two times a day (BID) | ORAL | Status: DC

## 2015-03-03 NOTE — Patient Instructions (Signed)
Your physician has recommended you make the following change in your medication: Bobby [name] been changed to 60 mg twice daily. A new prescription has been sent to your pharmacy.   Your physician wants you to follow-up in: 6 months or sooner if needed. You will receive a reminder letter in the mail two months in advance. If you don't receive a letter, please call our office to schedule the follow-up appointment.

## 2015-03-03 NOTE — Progress Notes (Signed)
Patient ID: Bobby [name], male   DOB: [birth year], 69 y.o.   MRN: [id number]    HPI: Bobby [name] is a 69 y.o. male presents to the office today for a 9 month followup evaluation.   Bobby [name]  has a long-standing tobacco history having started smoking at age 38, a history of hypertension, remote small TIA/CVA who presented to the hospital on 08/04/2012 in the setting of inferior ST segment elevation myocardial infarction. Catheterization by me revealed a 99% stenosis of the RCA and concomitant CAD involving his LAD and circumflex vessels. He underwent acute percutaneous coronary intervention with an excellent door to balloon time of only 20 minutes and ultimately insertion of a 3.25x32 mm progress DES stent post dilated 3.3. An echo done in the hospital showed an EF of 50-60% with mild basal inferior probable scar and moderate pulmonary hypertension with PA pressure of 45 mm.  When I saw Bobby [name] in May 2014, he did note shortness of breath with activity. He denied definitive chest pain. At that time, he realized that he had inadvertentlystopped taking the atorvastatin 40 mg dose and this was resumed. In addition, his Toprol dose was increased from 50 to 75 mg daily. A nuclear study showed an ejection fraction of 66%. Perfusion was essentially normal with exception of a small region of fixed inferoseptal bowel artifact and possible small area of inferobasilar scar.   Bobby [name] unfortunately continues to smoke at least a pack of cigarettes per day.   He does experience shortness of breath with activity.  He denies recent chest pain.  He has chronic right bundle branch block with repolarization changes.  He is diabetic and has pulmonary hypertension.  A follow-up nuclear perfusion study on 03/26/2014 remianed low risk and showed normal LV function and normal wall motion.  There was a fixed inferior defect that was felt most consistent with diaphragmatic attenuation.  There was no evidence for  ischemia.  Since I last saw him, he underwent prostate surgery by Dr. [name] without cardiovascular compromise.  Since I last saw him, he has undergone purposeful weight loss and his lost a proximally 22 pounds.  He presents to the office today for follow-up evaluation.   Past Medical History  Diagnosis Date  . Hypertension   . Panic attack   . CAD (coronary artery disease),residual non obstructive disease 08/05/2012  . Hyperlipidemia 08/05/2012  . S/P coronary artery stent placement, to RCA Promus DES 08/05/2012  . Tobacco abuse   . MI (myocardial infarction) 08/04/2012    acute inferior stemi secondary to RCA occlusion; PCI  . Pulmonary HTN     echo 08/05/12, EF 55-60%, PA pressure 27mm  . 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  . DM (diabetes mellitus), type 2 new diagnosis 08/05/2012    diet control  . ED (erectile dysfunction)     surgery planned    Past Surgical History  Procedure Laterality Date  . Cardiac catheterization  08/04/12    PCI to RCA with DES  . Leg surgery Left     rod placed for fracture repair  . Thumb arthroscopy Left     thumb joint replaced  . Penile prosthesis implant N/A 06/03/2014    Procedure: IMPLANT PENILE PROTHESIS INFLATABLE;  Surgeon: Ailene Rud, MD;  Location: [facility] ORS;  Service: Urology;  Laterality: N/A;  with penile block--0.5% marcaine plain  . Left heart catheterization with coronary angiogram N/A 08/04/2012    Procedure: LEFT HEART CATHETERIZATION WITH  CORONARY ANGIOGRAM;  Surgeon: Troy Sine, MD;  Location: [facility] CATH LAB;  Service: Cardiovascular;  Laterality: N/A;  . Percutaneous coronary stent intervention (pci-s) Right 08/04/2012    Procedure: PERCUTANEOUS CORONARY STENT INTERVENTION (PCI-S);  Surgeon: Troy Sine, MD;  Location: [facility] CATH LAB;  Service: Cardiovascular;  Laterality: Right;    Allergies  Allergen Reactions  . Hydrocodone Itching    Current Outpatient Prescriptions  Medication Sig Dispense Refill  .  albuterol (PROVENTIL) (2.5 MG/3ML) 0.083% nebulizer solution Take 5 mg by nebulization every 4 (four) hours as needed. For wheezing or SOB    . amLODipine (NORVASC) 5 MG tablet Take 5 mg by mouth every morning.    [name] aspirin 81 MG tablet Take 81 mg by mouth daily.    [name] atorvastatin (LIPITOR) 20 MG tablet Take 20 mg by mouth daily.    . cholecalciferol (VITAMIN D) 1000 UNITS tablet Take 2,000 Units by mouth daily.    . clonazePAM (KLONOPIN) 1 MG tablet Take 1 mg by mouth 2 (two) times daily as needed for anxiety.     . hydrochlorothiazide (MICROZIDE) 12.5 MG capsule Take 25 mg by mouth every morning.     [name] lisinopril (PRINIVIL,ZESTRIL) 20 MG tablet Take 1 tablet (20 mg total) by mouth 2 (two) times daily. 60 tablet 5  . metoprolol succinate (TOPROL-XL) 100 MG 24 hr tablet Take 100 mg by mouth every morning. Take with or immediately following a meal.    . nitroGLYCERIN (NITROSTAT) 0.4 MG SL tablet Place 1 tablet (0.4 mg total) under the tongue every 5 (five) minutes x 3 doses as needed for chest pain. 25 tablet 4  . Omega-3 Fatty Acids (FISH OIL) 1000 MG CPDR Take by mouth.    . oxyCODONE-acetaminophen (PERCOCET) 10-325 MG per tablet Take 1 tablet by mouth every 8 (eight) hours as needed for pain.     [name] tetrahydrozoline (VISINE) 0.05 % ophthalmic solution Place 1 drop into both eyes once as needed (dry/red eyes.).    [name] tiotropium (SPIRIVA) 18 MCG inhalation capsule Place 18 mcg into inhaler and inhale daily.    . ticagrelor (BRILINTA) 60 MG TABS tablet Take 1 tablet (60 mg total) by mouth 2 (two) times daily. 180 tablet 3   No current facility-administered medications for this visit.    Socially,  he is divorced. He doesn't walk but not routinely exercise.  He continues to smoke one pack of cigarettes per day  ROS General: Negative; No fevers, chills, or night sweats;  HEENT: Negative; No changes in vision or hearing, sinus congestion, difficulty swallowing Pulmonary:  Positive for shortness of  breath; No cough, wheezing, , hemoptysis Cardiovascular: See history of present illness GI: Negative; No nausea, vomiting, diarrhea, or abdominal pain GU: Negative; No dysuria, hematuria, or difficulty voiding Musculoskeletal: Negative; no myalgias, joint pain, or weakness Hematologic/Oncology: Negative; no easy bruising, bleeding Endocrine: Positive for diabetes no heat/cold intolerance;  Neuro: Negative; no changes in balance, headaches Skin: Negative; No rashes or skin lesions Psychiatric: Negative; No behavioral problems, depression Sleep: Negative; No snoring, daytime sleepiness, hypersomnolence, bruxism, restless legs, hypnogognic hallucinations, no cataplexy Other comprehensive 14 point system review is negative.   PE BP 110/60 mmHg  Pulse 57  Ht 5\' 9"  (1.753 m)  Wt 235 lb (106.595 kg)  BMI 34.69 kg/m2   Wt Readings from Last 3 Encounters:  03/03/15 235 lb (106.595 kg)  06/03/14 257 lb (116.574 kg)  05/28/14 257 lb (116.574 kg)   General: Alert, oriented, no  distress.  Ruddy complexion with rhinophyma HEENT: Normocephalic, atraumatic. Pupils round and reactive; sclera anicteric;  Nose without nasal septal hypertrophy Mouth/Parynx benign; Mallinpatti scale 3 Neck: No JVD, no carotid bruits .  Normal carotid upstroke Lungs: decreased BS diffusely without wheezing;  Chest wall: Nontender Heart: RRR, s1 s2 normal 1/6 sem; no S3 gallop.  No diastolic rub thrills or heaves Abdomen: Moderate central adiposity with significant panniculus; soft, nontender; no hepatosplenomehaly, BS+; abdominal aorta nontender and not dilated by palpation. Back: No CVA tenderness Pulses 2+ Extremities: no clubbing cyanosis or edema, Homan's sign negative  Neurologic: grossly nonfocal Psychological: Normal affect and mood  ECG (independently read by me): Sinus bradycardia at 57 bpm, right bundle branch block with repositioning changes.  QTc interval 459 ms.  ECG (independently read by me and (:  Normal sinus rhythm.  Right bundle branch block with repolarization changes.  Ventricular rate 81.  02/25/2014 ECG Normal sinus rhythm.  Right bundle branch block with repolarization changes.  PR interval 148 ms; QTc interval 463 ms   PriorECG: sinus rhythm at 76 beats per minute; right bundle-branch block with repolarization changes. PR interval 142 ms, QTC 481 ms.   LABS:  BMP Latest Ref Rng 06/04/2014 05/28/2014 08/06/2012  Glucose 70 - 99 mg/dL 179(H) 139(H) 111(H)  BUN 6 - 23 mg/dL 12 19 16   Creatinine 0.50 - 1.35 mg/dL 1.20 1.09 1.11  Sodium 137 - 147 mEq/L 135(L) 139 137  Potassium 3.7 - 5.3 mEq/L 4.1 4.4 3.8  Chloride 96 - 112 mEq/L 97 99 102  CO2 19 - 32 mEq/L 26 26 25   Calcium 8.4 - 10.5 mg/dL 8.6 9.8 9.4   No flowsheet data found. CBC Latest Ref Rng 06/04/2014 05/28/2014 08/06/2012  WBC 4.0 - 10.5 K/uL 14.7(H) 10.5 10.8(H)  Hemoglobin 13.0 - 17.0 g/dL 12.5(L) 14.6 14.5  Hematocrit 39.0 - 52.0 % 38.8(L) 43.8 43.9  Platelets 150 - 400 K/uL 183 227 190   Lab Results  Component Value Date   MCV 89.8 06/04/2014   MCV 86.7 05/28/2014   MCV 86.9 08/06/2012   Lab Results  Component Value Date   TSH 0.406 08/05/2012   Lipid Panel     Component Value Date/Time   CHOL 257* 08/05/2012 0430   TRIG 296* 08/05/2012 0430   HDL 32* 08/05/2012 0430   CHOLHDL 8.0 08/05/2012 0430   VLDL 59* 08/05/2012 0430   LDLCALC 166* 08/05/2012 0430   RADIOLOGY: No results found.    ASSESSMENT AND PLAN:  Mr. Okelley is now 69 years old and has has established CAD. He suffered an acute coronary syndrome on 08/04/2012 when he presented with subtotal occlusion of his RCA. He had diffuse disease beyond the subtotal occlusion and ultimately had successful insertion of a 3.25x32 mm PROMUS DES stent. He did have concomitant CAD with 40-50% proximal LAD narrowing, 30% circumflex marginal stenoses. NMR lipoprofile after initiation of atorvastatin in January 2014 showed marked improvement in total  cholesterol of 119 LDL 48 LDL particle #992. HDL was very low at 30 with an increased triglyceride at 203 and significantly reduced HDL particle #24.2. His insulin resistance score was elevated at 65.  Unfortunately, he has continued to smoke cigarettes.  He does have moderate pulmonary hypertension noted on echocardiography.  Presently, his blood pressure is controlled on HCTZ to 25 mg, amlodipine 5 mg,  lisinopril 20 mg twice a day and Toprol-XL 100 mg daily.  His resting pulse is 57 and stable.  He is on atorvastatin 20  mg for hyperlipidemia and is tolerating this well without side effects.  He tells me his primary physician, Dr. Huston Foley has recently checked blood work.  I do not have these results for my review.  The patient states that his blood work was all very good.  He continues to be on dual antiplatelets therapy.  Since it has been over 2 years since his primary MI.  I reviewed with him the recent Pegasys TIMI 54 trial.  He has been on full dose Brilinta and aspirin.  I will now switch him to Brilinta 60 mg twice a day, which should have similar efficacy with slight reduction in bleeding risk as well as dyspnea.  I again discussed the importance of complete smoking cessation, but I'm not certain the patient will do this.  I will try to obtain the lab results from Dr. Huston Foley.  Target LDL is less than 70 and if he is not at target dose adjustment will be necessary.  I will see him in 6 months for reevaluation.  Time spent: 25 minutes  Troy Sine, MD, [facility]  03/03/2015 8:48 PM

## 2015-05-19 ENCOUNTER — Ambulatory Visit: Payer: Medicare Other | Admitting: Cardiovascular Disease

## 2015-07-16 ENCOUNTER — Emergency Department (HOSPITAL_BASED_OUTPATIENT_CLINIC_OR_DEPARTMENT_OTHER): Payer: Medicare Other

## 2015-07-16 ENCOUNTER — Emergency Department (HOSPITAL_BASED_OUTPATIENT_CLINIC_OR_DEPARTMENT_OTHER)
Admission: EM | Admit: 2015-07-16 | Discharge: 2015-07-16 | Disposition: A | Discharge status: Home / Self Care | Payer: Medicare Other | Attending: Emergency Medicine | Admitting: Emergency Medicine

## 2015-07-16 ENCOUNTER — Encounter (HOSPITAL_BASED_OUTPATIENT_CLINIC_OR_DEPARTMENT_OTHER): Payer: Self-pay | Admitting: *Deleted

## 2015-07-16 DIAGNOSIS — Z955 Presence of coronary angioplasty implant and graft: Secondary | ICD-10-CM | POA: Insufficient documentation

## 2015-07-16 DIAGNOSIS — Z7982 Long term (current) use of aspirin: Secondary | ICD-10-CM | POA: Insufficient documentation

## 2015-07-16 DIAGNOSIS — E119 Type 2 diabetes mellitus without complications: Secondary | ICD-10-CM | POA: Insufficient documentation

## 2015-07-16 DIAGNOSIS — Z9889 Other specified postprocedural states: Secondary | ICD-10-CM | POA: Insufficient documentation

## 2015-07-16 DIAGNOSIS — F410 Panic disorder [episodic paroxysmal anxiety] without agoraphobia: Secondary | ICD-10-CM | POA: Insufficient documentation

## 2015-07-16 DIAGNOSIS — E785 Hyperlipidemia, unspecified: Secondary | ICD-10-CM | POA: Insufficient documentation

## 2015-07-16 DIAGNOSIS — I10 Essential (primary) hypertension: Secondary | ICD-10-CM | POA: Insufficient documentation

## 2015-07-16 DIAGNOSIS — Z8673 Personal history of transient ischemic attack (TIA), and cerebral infarction without residual deficits: Secondary | ICD-10-CM | POA: Insufficient documentation

## 2015-07-16 DIAGNOSIS — M10071 Idiopathic gout, right ankle and foot: Secondary | ICD-10-CM | POA: Insufficient documentation

## 2015-07-16 DIAGNOSIS — I2510 Atherosclerotic heart disease of native coronary artery without angina pectoris: Secondary | ICD-10-CM | POA: Diagnosis not present

## 2015-07-16 DIAGNOSIS — Z87448 Personal history of other diseases of urinary system: Secondary | ICD-10-CM | POA: Insufficient documentation

## 2015-07-16 DIAGNOSIS — I252 Old myocardial infarction: Secondary | ICD-10-CM | POA: Diagnosis not present

## 2015-07-16 DIAGNOSIS — F17210 Nicotine dependence, cigarettes, uncomplicated: Secondary | ICD-10-CM | POA: Insufficient documentation

## 2015-07-16 DIAGNOSIS — Z79899 Other long term (current) drug therapy: Secondary | ICD-10-CM | POA: Insufficient documentation

## 2015-07-16 DIAGNOSIS — M109 Gout, unspecified: Secondary | ICD-10-CM

## 2015-07-16 DIAGNOSIS — M79671 Pain in right foot: Secondary | ICD-10-CM | POA: Diagnosis present

## 2015-07-16 MED ORDER — INDOMETHACIN 25 MG PO CAPS: 25.0000 mg | ORAL_CAPSULE | Freq: Three times a day (TID) | ORAL | Status: DC | PRN

## 2015-07-16 NOTE — Discharge Instructions (Signed)

## 2015-07-16 NOTE — ED Notes (Signed)
Right great toe pain and swelling x 4 days

## 2015-07-16 NOTE — ED Provider Notes (Signed)
CSN: [id number]   Arrival date & time 07/16/15 1329  History  By signing my name below, I, Altamease Oiler, attest that this documentation has been prepared under the direction and in the presence of Dorie Rank, MD. Electronically Signed: Altamease Oiler, ED Scribe. 07/16/2015. 4:02 PM.  Chief Complaint  Patient presents with  . Foot Pain    HPI The history is provided by the patient. No language interpreter was used.   Bobby [name] is a 69 y.o. male with history of DM who presents to the Emergency Department complaining of new and atraumatic pain and swelling in the right great toe with onset 3 days ago. He describes the pain as sore and rates it 9/10 in severity. He used prednisone that he had left over from a previous illness with no relief. No fever, chills, or nausea. No history of gout. Pt has a scheduled appointment with his PCP next week.   Past Medical History  Diagnosis Date  . Hypertension   . Panic attack   . CAD (coronary artery disease),residual non obstructive disease 08/05/2012  . Hyperlipidemia 08/05/2012  . S/P coronary artery stent placement, to RCA Promus DES 08/05/2012  . Tobacco abuse   . MI (myocardial infarction) ([place]) 08/04/2012    acute inferior stemi secondary to RCA occlusion; PCI  . Pulmonary HTN ([place])     echo 08/05/12, EF 55-60%, PA pressure 42mm  . Stroke [facility])     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  . DM (diabetes mellitus), type 2 new diagnosis 08/05/2012    diet control  . ED (erectile dysfunction)     surgery planned    Past Surgical History  Procedure Laterality Date  . Cardiac catheterization  08/04/12    PCI to RCA with DES  . Leg surgery Left     rod placed for fracture repair  . Thumb arthroscopy Left     thumb joint replaced  . Penile prosthesis implant N/A 06/03/2014    Procedure: IMPLANT PENILE PROTHESIS INFLATABLE;  Surgeon: Ailene Rud, MD;  Location: [facility] ORS;  Service: Urology;  Laterality: N/A;  with penile block--0.5% marcaine  plain  . Left heart catheterization with coronary angiogram N/A 08/04/2012    Procedure: LEFT HEART CATHETERIZATION WITH CORONARY ANGIOGRAM;  Surgeon: Troy Sine, MD;  Location: [facility] CATH LAB;  Service: Cardiovascular;  Laterality: N/A;  . Percutaneous coronary stent intervention (pci-s) Right 08/04/2012    Procedure: PERCUTANEOUS CORONARY STENT INTERVENTION (PCI-S);  Surgeon: Troy Sine, MD;  Location: [facility] CATH LAB;  Service: Cardiovascular;  Laterality: Right;    Family History  Problem Relation Age of Onset  . Kidney disease Father     kidney disease and heart disease  . Heart disease Maternal Grandmother   . Stomach cancer Maternal Grandfather   . Heart attack Paternal Grandmother     Social History  Substance Use Topics  . Smoking status: Current Every Day Smoker -- 1.00 packs/day for 56 years    Types: Cigarettes  . Smokeless tobacco: Never Used     Comment: smoking now 1 ppd  . Alcohol Use: Yes     Comment: ocasionally     Review of Systems  Constitutional: Negative for fever and chills.  Gastrointestinal: Negative for nausea.  Musculoskeletal:       Pain and swelling at right great toe   Home Medications   Prior to Admission medications   Medication Sig Start Date End Date Taking? Authorizing Provider  albuterol (PROVENTIL) (2.5 MG/3ML) 0.083% nebulizer  solution Take 5 mg by nebulization every 4 (four) hours as needed. For wheezing or SOB    Historical Provider, MD  amLODipine (NORVASC) 5 MG tablet Take 5 mg by mouth every morning.    Historical Provider, MD  aspirin 81 MG tablet Take 81 mg by mouth daily.    Historical Provider, MD  atorvastatin (LIPITOR) 20 MG tablet Take 20 mg by mouth daily.    Historical Provider, MD  cholecalciferol (VITAMIN D) 1000 UNITS tablet Take 2,000 Units by mouth daily.    Historical Provider, MD  clonazePAM (KLONOPIN) 1 MG tablet Take 1 mg by mouth 2 (two) times daily as needed for anxiety.     Historical Provider, MD   hydrochlorothiazide (MICROZIDE) 12.5 MG capsule Take 25 mg by mouth every morning.     Historical Provider, MD  indomethacin (INDOCIN) 25 MG capsule Take 1 capsule (25 mg total) by mouth 3 (three) times daily as needed. 07/16/15   Dorie Rank, MD  lisinopril (PRINIVIL,ZESTRIL) 20 MG tablet Take 1 tablet (20 mg total) by mouth 2 (two) times daily. 04/08/14   Erlene Quan, PA-C  metoprolol succinate (TOPROL-XL) 100 MG 24 hr tablet Take 100 mg by mouth every morning. Take with or immediately following a meal.    Historical Provider, MD  nitroGLYCERIN (NITROSTAT) 0.4 MG SL tablet Place 1 tablet (0.4 mg total) under the tongue every 5 (five) minutes x 3 doses as needed for chest pain. 01/05/14   Troy Sine, MD  Omega-3 Fatty Acids (FISH OIL) 1000 MG CPDR Take by mouth.    Historical Provider, MD  oxyCODONE-acetaminophen (PERCOCET) 10-325 MG per tablet Take 1 tablet by mouth every 8 (eight) hours as needed for pain.     Historical Provider, MD  tetrahydrozoline (VISINE) 0.05 % ophthalmic solution Place 1 drop into both eyes once as needed (dry/red eyes.).    Historical Provider, MD  ticagrelor (BRILINTA) 60 MG TABS tablet Take 1 tablet (60 mg total) by mouth 2 (two) times daily. 03/03/15   Troy Sine, MD  tiotropium (SPIRIVA) 18 MCG inhalation capsule Place 18 mcg into inhaler and inhale daily.    Historical Provider, MD    Allergies  Hydrocodone  Triage Vitals: BP 127/71 mmHg  Pulse 62  Temp(Src) 97.8 F (36.6 C) (Oral)  Resp 20  Ht 5\' 9"  (1.753 m)  Wt 240 lb (108.863 kg)  BMI 35.43 kg/m2  SpO2 97%  Physical Exam  Constitutional: He appears well-developed and well-nourished. No distress.  HENT:  Head: Normocephalic and atraumatic.  Right Ear: External ear normal.  Left Ear: External ear normal.  Eyes: Conjunctivae are normal. Right eye exhibits no discharge. Left eye exhibits no discharge. No scleral icterus.  Neck: Neck supple. No tracheal deviation present.  Cardiovascular: Normal  rate.   Pulmonary/Chest: Effort normal. No stridor. No respiratory distress.  Musculoskeletal: He exhibits no edema.  Erythema of the first metatarsal phalangeal, pain with ROM, Normal distal capillary refill, normal pulses, no lymphangitic streaking.   Neurological: He is alert. Cranial nerve deficit: no gross deficits.  Skin: Skin is warm and dry. No rash noted.  Psychiatric: He has a normal mood and affect.  Nursing note and vitals reviewed.   ED Course  Procedures   DIAGNOSTIC STUDIES: Oxygen Saturation is 97% on RA, normal by my interpretation.    COORDINATION OF CARE: 3:59 PM Discussed treatment plan which includes right foot XR with pt at bedside and pt agreed to plan.  Labs Reviewed -  No data to display  Imaging Review Dg Foot Complete Right  07/16/2015  CLINICAL DATA:  Pt here with c/o RIGHT foot pain x3-4 days with NKI. Fears it may be gout. Pain and edema present. Edema is mostly around medial part of foot, distal half, as well as dorsal surface and plantar as well. No prior injury or surgery to area. EXAM: RIGHT FOOT COMPLETE - 3+ VIEW COMPARISON:  None. FINDINGS: No evidence for acute fracture dislocation. There are sclerotic erosions at the interphalangeal joint of the great toe, associated with soft tissue swelling. These are consistent with gout. Bone mineral density is normal. No radiopaque foreign body or soft tissue gas. IMPRESSION: Erosions of the interphalangeal joint of the great toe are consistent with gout. Electronically Signed   By: Nolon Nations M.D.   On: 07/16/2015 14:38    I personally reviewed and evaluated these images as a part of my medical decision-making.    MDM   Final diagnoses:  Acute gout of right foot, unspecified cause   Patient's symptoms are consistent with gout. I doubt acute infection. Denies any recent injuries.  Plan on discharge home with a prescription for Indocin. Follow-up with his doctor this week. Monitor for fevers  worsening symptoms.  I personally performed the services described in this documentation, which was scribed in my presence.  The recorded information has been reviewed and is accurate.     Dorie Rank, MD 07/16/15 [id number]

## 2016-04-25 ENCOUNTER — Telehealth: Payer: Self-pay | Admitting: Cardiovascular Disease

## 2016-04-25 NOTE — Telephone Encounter (Signed)
No DPR on file, I did not return sister's call.  Called patient directly to get a summary of concerns. Pt dismissed issues, notes he is not really SOB (moreso than typical), he notes he had chest pain a couple days ago, took 1 NTG which resolved completely. Pt did not elicit further information or details regarding symptoms.  I reminded him of instructions of use for med, advised if he must take more than 2 doses and pain not completely resolved w 3rd dose or if pain occurs 15-20 mins or longer, he should go to ER.  Pt acknowledged, voiced understanding.  Confirmed next week's app visit. He is aware to call if new symptoms.

## 2016-04-25 NOTE — Telephone Encounter (Signed)
New message       Pt c/o of Chest Pain: STAT if CP now or developed within 24 hours  1. Are you having CP right now? no 2. Are you experiencing any other symptoms (ex. SOB, nausea, vomiting, sweating)? Always has sob, pt is sleeping a lot  3. How long have you been experiencing CP? 1-2 days ago  4. Is your CP continuous or coming and going? Not sure  5. Have you taken Nitroglycerin? ?yes Pt's sister did not have a lot of info.  She said "he was a man and would not have called on his own".  Appt was made for 05-02-16 with Suanne Marker, but she want to know if he needs to be seen sooner?  All she know is that he had chest pain and had to take nitro.

## 2016-04-25 NOTE — Telephone Encounter (Signed)
Understood. Agree with your recommendations.

## 2016-05-02 ENCOUNTER — Encounter: Payer: Self-pay | Admitting: Nurse Practitioner

## 2016-05-02 ENCOUNTER — Ambulatory Visit (INDEPENDENT_AMBULATORY_CARE_PROVIDER_SITE_OTHER): Payer: Medicare Other | Admitting: Nurse Practitioner

## 2016-05-02 VITALS — HR 80 | Ht 69.0 in | Wt 249.5 lb

## 2016-05-02 DIAGNOSIS — I119 Hypertensive heart disease without heart failure: Secondary | ICD-10-CM | POA: Insufficient documentation

## 2016-05-02 DIAGNOSIS — Z720 Tobacco use: Secondary | ICD-10-CM

## 2016-05-02 DIAGNOSIS — E785 Hyperlipidemia, unspecified: Secondary | ICD-10-CM

## 2016-05-02 DIAGNOSIS — I25119 Atherosclerotic heart disease of native coronary artery with unspecified angina pectoris: Secondary | ICD-10-CM

## 2016-05-02 DIAGNOSIS — R072 Precordial pain: Secondary | ICD-10-CM

## 2016-05-02 NOTE — Progress Notes (Signed)
Office Visit    Patient Name: Bobby [name] Date of Encounter: 05/02/2016  Primary Care Provider:  [name], MD Primary Cardiologist:  Corky Downs, MD   Chief Complaint    70 year old male with a history of CAD status post inferior MI in 2013 who presents for follow-up related to chest pain.   Past Medical History:  Diagnosis Date  . CAD (coronary artery disease),residual non obstructive disease    a. 08/2012 Inferior STEMI: RCA 99% (3.5x32 Promus DES), nonobs LAD/LCX dzs;  b. 01/2013 MV: EF 66%, small inferoseptal artifact and possible small inferobasilar scar;  c. 03/2014 MV: fixed inf defect->diaph attenuation, no ischemia.  . DM (diabetes mellitus), type 2 new diagnosis 08/05/2012   diet control  . ED (erectile dysfunction)    surgery planned  . History of echocardiogram    a. 08/2012 Echo: EF 55-60%, possible mild basal inferior HK, mildly dil RA, PASP 74mmHg.  [name] Hyperlipidemia 08/05/2012  . Hypertensive heart disease   . MI (myocardial infarction) ([place]) 08/04/2012   acute inferior stemi secondary to RCA occlusion; PCI  . Panic attack   . Pulmonary HTN ([place])    echo 08/05/12, EF 55-60%, PA pressure 50mm  . S/P coronary artery stent placement, to RCA Promus DES 08/05/2012  . Stroke [facility])    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  . Tobacco abuse    Past Surgical History:  Procedure Laterality Date  . CARDIAC CATHETERIZATION  08/04/12   PCI to RCA with DES  . LEFT HEART CATHETERIZATION WITH CORONARY ANGIOGRAM N/A 08/04/2012   Procedure: LEFT HEART CATHETERIZATION WITH CORONARY ANGIOGRAM;  Surgeon: Troy Sine, MD;  Location: [facility] CATH LAB;  Service: Cardiovascular;  Laterality: N/A;  . LEG SURGERY Left    rod placed for fracture repair  . PENILE PROSTHESIS IMPLANT N/A 06/03/2014   Procedure: IMPLANT PENILE PROTHESIS INFLATABLE;  Surgeon: Ailene Rud, MD;  Location: [facility] ORS;  Service: Urology;  Laterality: N/A;  with penile block--0.5% marcaine plain  . PERCUTANEOUS  CORONARY STENT INTERVENTION (PCI-S) Right 08/04/2012   Procedure: PERCUTANEOUS CORONARY STENT INTERVENTION (PCI-S);  Surgeon: Troy Sine, MD;  Location: [facility] CATH LAB;  Service: Cardiovascular;  Laterality: Right;  . THUMB ARTHROSCOPY Left    thumb joint replaced    Allergies  Allergies  Allergen Reactions  . Hydrocodone Itching    History of Present Illness    70 year old male with the above complex past medical history including a history of CAD status post inferior STEMI in December 2013 with drug-eluting stent placement to the right coronary artery. He has had 2 nuclear study since then, the last of which in July 2015, which was low risk. He also has a history of hypertension, hyperlipidemia, remote TIA/CVA, and ongoing tobacco abuse-which she started at age 65. He was last seen by Dr. Claiborne Billings in June 2016, at which time he was doing well. He continued to do well over the past year and says he is relatively active without significant limitations in activity. He's cut down his smoking from one of the half to 2 packs per day to half a pack per day and he does hope to quit. Last week, he was arguing with his sister and had a brief episode of chest discomfort which he says lasted just a few seconds. He says he took a nitroglycerin but the pain resolved prior to even taking it. He has not had any recurrence of symptoms. Because of that episode of chest pain, he called the  office and was scheduled for today. He denies dyspnea, PND, orthopnea, dizziness, syncope, palpitations, edema, or early satiety. He is not interested in any additional testing related to the one episode of chest pain.   Home Medications    Prior to Admission medications   Medication Sig Start Date End Date Taking? Authorizing Provider  albuterol (PROVENTIL) (2.5 MG/3ML) 0.083% nebulizer solution Take 5 mg by nebulization every 4 (four) hours as needed. For wheezing or SOB    Historical Provider, MD  amLODipine (NORVASC) 5 MG  tablet Take 5 mg by mouth every morning.    Historical Provider, MD  aspirin 81 MG tablet Take 81 mg by mouth daily.    Historical Provider, MD  atorvastatin (LIPITOR) 20 MG tablet Take 20 mg by mouth daily.    Historical Provider, MD  cholecalciferol (VITAMIN D) 1000 UNITS tablet Take 2,000 Units by mouth daily.    Historical Provider, MD  clonazePAM (KLONOPIN) 1 MG tablet Take 1 mg by mouth 2 (two) times daily as needed for anxiety.     Historical Provider, MD  hydrochlorothiazide (MICROZIDE) 12.5 MG capsule Take 25 mg by mouth every morning.     Historical Provider, MD  indomethacin (INDOCIN) 25 MG capsule Take 1 capsule (25 mg total) by mouth 3 (three) times daily as needed. 07/16/15   Dorie Rank, MD  lisinopril (PRINIVIL,ZESTRIL) 20 MG tablet Take 1 tablet (20 mg total) by mouth 2 (two) times daily. 04/08/14   Erlene Quan, PA-C  metoprolol succinate (TOPROL-XL) 100 MG 24 hr tablet Take 100 mg by mouth every morning. Take with or immediately following a meal.    Historical Provider, MD  nitroGLYCERIN (NITROSTAT) 0.4 MG SL tablet Place 1 tablet (0.4 mg total) under the tongue every 5 (five) minutes x 3 doses as needed for chest pain. 01/05/14   Troy Sine, MD  Omega-3 Fatty Acids (FISH OIL) 1000 MG CPDR Take by mouth.    Historical Provider, MD  oxyCODONE-acetaminophen (PERCOCET) 10-325 MG per tablet Take 1 tablet by mouth every 8 (eight) hours as needed for pain.     Historical Provider, MD  tetrahydrozoline (VISINE) 0.05 % ophthalmic solution Place 1 drop into both eyes once as needed (dry/red eyes.).    Historical Provider, MD  ticagrelor (BRILINTA) 60 MG TABS tablet Take 1 tablet (60 mg total) by mouth 2 (two) times daily. 03/03/15   Troy Sine, MD  tiotropium (SPIRIVA) 18 MCG inhalation capsule Place 18 mcg into inhaler and inhale daily.    Historical Provider, MD    Review of Systems    As above, he recently had one brief episode of chest discomfort. He otherwise denies dyspnea, PND,  orthopnea, palpitations, dizziness, syncope, edema, or early satiety.  All other systems reviewed and are otherwise negative except as noted above.  Physical Exam    VS:  Pulse 80   Ht 5\' 9"  (1.753 m)   Wt 249 lb 8 oz (113.2 kg)   SpO2 95%   BMI 36.84 kg/m  , BMI Body mass index is 36.84 kg/m. GEN: Well nourished, well developed, in no acute distress.  HEENT: normal.  Neck: Supple, no JVD, carotid bruits, or masses. Cardiac: RRR, no murmurs, rubs, or gallops. No clubbing, cyanosis, edema.  Radials/DP/PT 2+ and equal bilaterally.  Respiratory:  Respirations regular and unlabored, clear to auscultation bilaterally. GI: Soft, nontender, nondistended, BS + x 4. MS: no deformity or atrophy. Skin: warm and dry, no rash. Neuro:  Strength and sensation  are intact. Psych: Normal affect.  Accessory Clinical Findings    ECG - Sinus arrhythmia, 80, right bundle branch block-no acute ST or T changes.  Assessment & Plan    1.  Chest pain/coronary artery disease: Patient is status post inferior STEMI with right coronary artery stenting in 2013. He has had subsequent low risk Myoview's in 2014 and 2015. He's done well over the past year but last week, had a brief episode of chest discomfort in the setting of emotional upset when arguing with his sister. He has not had any recurrence of symptoms. He is not interested in any ischemic testing at this time and says that if he has recurrence of symptoms, he will most certainly contact us for additional testing. He remains on aspirin, statin, beta blocker, ACE inhibitor, and reduced dose Brilinta.  2. Hypertensive heart disease: Blood pressure is elevated today at 150/76. He is on beta blocker, ACE inhibitor, and calcium channel blocker. I repeated his blood pressure and it was 140/80. He says that these are the 2 highest readings he has seen in quite some time and he does check his blood pressure at home regularly. Rather making any changes today, I  recommended that he continue to follow his blood pressure at home and contact us as soon as possible if pressures are consistently over 140, at which point, we can increase his amlodipine to 10 mg daily.  3. Hyperlipidemia: He remains on statin therapy and says that lipids are followed by his primary care.  4. Tobacco abuse: He continues to smoke cigarettes but has cut back to half a pack a day. He is considering quitting at this point and I recommended he chews a quit date.  5. Disposition: Follow-up with Dr. Claiborne Billings in 3 months or sooner if necessary.   Murray Hodgkins, NP 05/02/2016, 4:18 PM

## 2016-05-02 NOTE — Patient Instructions (Addendum)
Medication Instructions:  Your physician recommends that you continue on your current medications as directed. Please refer to the Current Medication list given to you today.  Labwork: None Ordered  Testing/Procedures: None Ordered  Follow-Up: Your physician recommends that you schedule a follow-up appointment in: 3 months with DR KELLY.  Any Other Special Instructions Will Be Listed Below (If Applicable).  NEXT APPOINTMENT   DATE:________________________________________  TIME:____________________________________    If you need a refill on your cardiac medications before your next appointment, please call your pharmacy.

## 2016-05-22 ENCOUNTER — Other Ambulatory Visit (HOSPITAL_COMMUNITY): Payer: Self-pay | Admitting: Family Medicine

## 2016-05-29 ENCOUNTER — Other Ambulatory Visit (HOSPITAL_COMMUNITY): Payer: Self-pay | Admitting: Family Medicine

## 2016-05-29 DIAGNOSIS — F17210 Nicotine dependence, cigarettes, uncomplicated: Secondary | ICD-10-CM

## 2016-05-29 DIAGNOSIS — Z136 Encounter for screening for cardiovascular disorders: Secondary | ICD-10-CM

## 2016-06-08 ENCOUNTER — Ambulatory Visit (HOSPITAL_COMMUNITY)
Admission: RE | Admit: 2016-06-08 | Discharge: 2016-06-08 | Disposition: A | Discharge status: Home / Self Care | Payer: Medicare Other | Source: Ambulatory Visit | Attending: Family Medicine | Admitting: Family Medicine

## 2016-06-08 DIAGNOSIS — Z136 Encounter for screening for cardiovascular disorders: Secondary | ICD-10-CM | POA: Insufficient documentation

## 2016-06-08 DIAGNOSIS — F17210 Nicotine dependence, cigarettes, uncomplicated: Secondary | ICD-10-CM | POA: Insufficient documentation

## 2016-08-02 ENCOUNTER — Ambulatory Visit (INDEPENDENT_AMBULATORY_CARE_PROVIDER_SITE_OTHER): Payer: Medicare Other | Admitting: Cardiovascular Disease

## 2016-08-02 ENCOUNTER — Encounter: Payer: Self-pay | Admitting: Cardiovascular Disease

## 2016-08-02 VITALS — BP 106/64 | HR 66 | Ht 69.0 in | Wt 245.5 lb

## 2016-08-02 DIAGNOSIS — I2510 Atherosclerotic heart disease of native coronary artery without angina pectoris: Secondary | ICD-10-CM | POA: Diagnosis not present

## 2016-08-02 DIAGNOSIS — E119 Type 2 diabetes mellitus without complications: Secondary | ICD-10-CM | POA: Diagnosis not present

## 2016-08-02 DIAGNOSIS — E785 Hyperlipidemia, unspecified: Secondary | ICD-10-CM

## 2016-08-02 DIAGNOSIS — I2583 Coronary atherosclerosis due to lipid rich plaque: Secondary | ICD-10-CM | POA: Diagnosis not present

## 2016-08-02 DIAGNOSIS — Z720 Tobacco use: Secondary | ICD-10-CM

## 2016-08-02 DIAGNOSIS — I119 Hypertensive heart disease without heart failure: Secondary | ICD-10-CM

## 2016-08-02 DIAGNOSIS — I10 Essential (primary) hypertension: Secondary | ICD-10-CM | POA: Diagnosis not present

## 2016-08-02 DIAGNOSIS — I4510 Unspecified right bundle-branch block: Secondary | ICD-10-CM

## 2016-08-02 LAB — COMPREHENSIVE METABOLIC PANEL
ALBUMIN: 3.9 g/dL (ref 3.6–5.1)
ALK PHOS: 83 U/L (ref 40–115)
ALT: 14 U/L (ref 9–46)
AST: 13 U/L (ref 10–35)
BILIRUBIN TOTAL: 0.3 mg/dL (ref 0.2–1.2)
BUN: 28 mg/dL — ABNORMAL HIGH (ref 7–25)
CALCIUM: 9.2 mg/dL (ref 8.6–10.3)
CO2: 23 mmol/L (ref 20–31)
Chloride: 105 mmol/L (ref 98–110)
Creat: 1.66 mg/dL — ABNORMAL HIGH (ref 0.70–1.18)
GLUCOSE: 102 mg/dL — AB (ref 65–99)
POTASSIUM: 4.5 mmol/L (ref 3.5–5.3)
Sodium: 138 mmol/L (ref 135–146)
Total Protein: 6.6 g/dL (ref 6.1–8.1)

## 2016-08-02 LAB — LIPID PANEL
CHOL/HDL RATIO: 4.6 ratio (ref <5.0)
CHOLESTEROL: 146 mg/dL (ref <200)
HDL: 32 mg/dL — ABNORMAL LOW (ref >40)
LDL Cholesterol: 76 mg/dL (ref <100)
TRIGLYCERIDES: 192 mg/dL — AB (ref <150)
VLDL: 38 mg/dL — AB (ref <30)

## 2016-08-02 LAB — CBC
HEMATOCRIT: 41.8 % (ref 38.5–50.0)
HEMOGLOBIN: 13.9 g/dL (ref 13.2–17.1)
MCH: 28.7 pg (ref 27.0–33.0)
MCHC: 33.3 g/dL (ref 32.0–36.0)
MCV: 86.4 fL (ref 80.0–100.0)
MPV: 10.7 fL (ref 7.5–12.5)
Platelets: 234 10*3/uL (ref 140–400)
RBC: 4.84 MIL/uL (ref 4.20–5.80)
RDW: 14.6 % (ref 11.0–15.0)
WBC: 12.5 10*3/uL — ABNORMAL HIGH (ref 3.8–10.8)

## 2016-08-02 NOTE — Patient Instructions (Signed)
Your physician recommends that you return for lab work in: [place].  Your physician wants you to follow-up in: 1 year or sooner if needed. You will receive a reminder letter in the mail two months in advance. If you don't receive a letter, please call our office to schedule the follow-up appointment.  If you need a refill on your cardiac medications before your next appointment, please call your pharmacy.

## 2016-08-02 NOTE — Progress Notes (Signed)
Patient ID: Bobby [name], male   DOB: [birth year], 70 y.o.   MRN: [id number]    HPI: Bobby [name] is a 70 y.o. male presents to the office today for a 17 month followup evaluation.   Bobby [name]  has a long-standing tobacco history having started smoking at age 17, a history of hypertension, remote small TIA/CVA who presented to the hospital on 08/04/2012 in the setting of inferior ST segment elevation myocardial infarction. Catheterization by me revealed a 99% stenosis of the RCA and concomitant CAD involving his LAD and circumflex vessels. He underwent acute percutaneous coronary intervention with an excellent door to balloon time of only 20 minutes and ultimately insertion of a 3.25x32 mm DES stent post dilated 3.3. An echo done in the hospital showed an EF of 50-60% with mild basal inferior probable scar and moderate pulmonary hypertension with PA pressure of 45 mm.  When I saw Bobby [name] in May 2014, he did note shortness of breath with activity. He denied definitive chest pain. At that time, he realized that he had inadvertentlystopped taking the atorvastatin 40 mg dose and this was resumed. In addition, his Toprol dose was increased from 50 to 75 mg daily. A nuclear study showed an ejection fraction of 66%. Perfusion was essentially normal with exception of a small region of fixed inferoseptal bowel artifact and possible small area of inferobasilar scar.   Bobby [name] unfortunately continues to smoke at least a pack of cigarettes per day.   He does experience shortness of breath with activity.  He denies recent chest pain.  He has chronic right bundle branch block with repolarization changes.  He is diabetic and has pulmonary hypertension.  A follow-up nuclear perfusion study on 03/26/2014 remianed low risk and showed normal LV function and normal wall motion.  There was a fixed inferior defect that was felt most consistent with diaphragmatic attenuation.  There was no evidence for ischemia.  He  underwent prostate surgery by Dr. [name] without cardiovascular compromise.    Since I last saw him, he continues to smoke cigarettes.  He is followed by Dr. Clearence Cheek at [facility].  In August 2017 .  He was seen by Bobby [name] in our office.  At that time his blood pressure was elevated at 150/76, and he was on beta blocker, ACE inhibitor and calcium channel blocker.  She improved to 140/80.  No adjustment was made but the patient was to continue to monitor his blood pressure.  He denies any episodes of chest pain.  He denies presyncope or syncope.  He notes a very rare chest flutter, which is short-lived.  He underwent a screening abdominal aortic ultrasound to assess for aortic aneurysm.  This revealed a normal abdominal aorta.  He presents for evaluation.  Past Medical History:  Diagnosis Date  . CAD (coronary artery disease),residual non obstructive disease    a. 08/2012 Inferior STEMI: RCA 99% (3.5x32 Promus DES), nonobs LAD/LCX dzs;  b. 01/2013 MV: EF 66%, small inferoseptal artifact and possible small inferobasilar scar;  c. 03/2014 MV: fixed inf defect->diaph attenuation, no ischemia.  . DM (diabetes mellitus), type 2 new diagnosis 08/05/2012   diet control  . ED (erectile dysfunction)    surgery planned  . History of echocardiogram    a. 08/2012 Echo: EF 55-60%, possible mild basal inferior HK, mildly dil RA, PASP 67mmHg.  [name] Hyperlipidemia 08/05/2012  . Hypertensive heart disease   . MI (myocardial infarction) 08/04/2012   acute inferior stemi  secondary to RCA occlusion; PCI  . Panic attack   . Pulmonary HTN    echo 08/05/12, EF 55-60%, PA pressure 62mm  . S/P coronary artery stent placement, to RCA Promus DES 08/05/2012  . Stroke [facility])    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  . Tobacco abuse     Past Surgical History:  Procedure Laterality Date  . CARDIAC CATHETERIZATION  08/04/12   PCI to RCA with DES  . LEFT HEART CATHETERIZATION WITH CORONARY ANGIOGRAM N/A 08/04/2012     Procedure: LEFT HEART CATHETERIZATION WITH CORONARY ANGIOGRAM;  Surgeon: Troy Sine, MD;  Location: [facility] CATH LAB;  Service: Cardiovascular;  Laterality: N/A;  . LEG SURGERY Left    rod placed for fracture repair  . PENILE PROSTHESIS IMPLANT N/A 06/03/2014   Procedure: IMPLANT PENILE PROTHESIS INFLATABLE;  Surgeon: Ailene Rud, MD;  Location: [facility] ORS;  Service: Urology;  Laterality: N/A;  with penile block--0.5% marcaine plain  . PERCUTANEOUS CORONARY STENT INTERVENTION (PCI-S) Right 08/04/2012   Procedure: PERCUTANEOUS CORONARY STENT INTERVENTION (PCI-S);  Surgeon: Troy Sine, MD;  Location: [facility] CATH LAB;  Service: Cardiovascular;  Laterality: Right;  . THUMB ARTHROSCOPY Left    thumb joint replaced    Allergies  Allergen Reactions  . Hydrocodone Itching    Current Outpatient Prescriptions  Medication Sig Dispense Refill  . albuterol (PROVENTIL) (2.5 MG/3ML) 0.083% nebulizer solution Take 5 mg by nebulization every 4 (four) hours as needed. For wheezing or SOB    . amLODipine (NORVASC) 5 MG tablet Take 5 mg by mouth every morning.    [name] aspirin 81 MG tablet Take 81 mg by mouth daily.    [name] atorvastatin (LIPITOR) 20 MG tablet Take 20 mg by mouth daily.    . cholecalciferol (VITAMIN D) 1000 UNITS tablet Take 2,000 Units by mouth daily.    . clonazePAM (KLONOPIN) 1 MG tablet Take 1 mg by mouth 2 (two) times daily as needed for anxiety.     . hydrochlorothiazide (MICROZIDE) 12.5 MG capsule Take 25 mg by mouth every morning.     . indomethacin (INDOCIN) 25 MG capsule Take 1 capsule (25 mg total) by mouth 3 (three) times daily as needed. 21 capsule 0  . lisinopril (PRINIVIL,ZESTRIL) 20 MG tablet Take 1 tablet (20 mg total) by mouth 2 (two) times daily. 60 tablet 5  . metoprolol succinate (TOPROL-XL) 100 MG 24 hr tablet Take 100 mg by mouth every morning. Take with or immediately following a meal.    . nitroGLYCERIN (NITROSTAT) 0.4 MG SL tablet Place 1 tablet (0.4 mg total) under  the tongue every 5 (five) minutes x 3 doses as needed for chest pain. 25 tablet 4  . Omega-3 Fatty Acids (FISH OIL) 1000 MG CPDR Take by mouth.    . oxyCODONE-acetaminophen (PERCOCET) 10-325 MG per tablet Take 1 tablet by mouth every 8 (eight) hours as needed for pain.     [name] tetrahydrozoline (VISINE) 0.05 % ophthalmic solution Place 1 drop into both eyes once as needed (dry/red eyes.).    [name] ticagrelor (BRILINTA) 60 MG TABS tablet Take 1 tablet (60 mg total) by mouth 2 (two) times daily. 180 tablet 3  . tiotropium (SPIRIVA) 18 MCG inhalation capsule Place 18 mcg into inhaler and inhale daily.     No current facility-administered medications for this visit.     Socially,  he is divorced. He doesn't walk but not routinely exercise.  He continues to smoke one pack of cigarettes per day  ROS General:  Negative; No fevers, chills, or night sweats;  HEENT: Negative; No changes in vision or hearing, sinus congestion, difficulty swallowing Pulmonary:  Positive for shortness of breath; No cough, wheezing, , hemoptysis Cardiovascular: See history of present illness GI: Negative; No nausea, vomiting, diarrhea, or abdominal pain GU: Negative; No dysuria, hematuria, or difficulty voiding Musculoskeletal: Negative; no myalgias, joint pain, or weakness Hematologic/Oncology: Negative; no easy bruising, bleeding Endocrine: Positive for diabetes no heat/cold intolerance;  Neuro: Negative; no changes in balance, headaches Skin: Negative; No rashes or skin lesions Psychiatric: Negative; No behavioral problems, depression Sleep: Negative; No snoring, daytime sleepiness, hypersomnolence, bruxism, restless legs, hypnogognic hallucinations, no cataplexy Other comprehensive 14 point system review is negative.   PE BP 106/64 (BP Location: Right Arm, Patient Position: Sitting, Cuff Size: Large)   Pulse 66   Ht 5\' 9"  (1.753 m)   Wt 245 lb 8 oz (111.4 kg)   BMI 36.25 kg/m     Wt Readings from Last 3  Encounters:  08/02/16 245 lb 8 oz (111.4 kg)  05/02/16 249 lb 8 oz (113.2 kg)  07/16/15 240 lb (108.9 kg)   General: Alert, oriented, no distress.  Ruddy complexion with rhinophyma HEENT: Normocephalic, atraumatic. Pupils round and reactive; sclera anicteric;  Nose without nasal septal hypertrophy Mouth/Parynx benign; Mallinpatti scale 3 Neck: No JVD, no carotid bruits .  Normal carotid upstroke Lungs: decreased BS diffusely without wheezing;  Chest wall: Nontender Heart: RRR, s1 s2 normal 1/6 short systolic murmur no S3 gallop.  No diastolic rub thrills or heaves Abdomen: Moderate central adiposity with significant panniculus; soft, nontender; no hepatosplenomehaly, BS+; abdominal aorta nontender and not dilated by palpation. Back: No CVA tenderness Pulses 2+ Extremities: no clubbing cyanosis or edema, Homan's sign negative  Neurologic: grossly nonfocal Psychological: Normal affect and mood  ECG (independently read by me): Normal sinus rhythm at 64 bpm.  Branch block with repolarization changes.  QTc interval 460 ms.  June 2016 ECG (independently read by me): Sinus bradycardia at 57 bpm, right bundle branch block with repositioning changes.  QTc interval 459 ms.  September 2015 ECG (independently read by me and (: Normal sinus rhythm.  Right bundle branch block with repolarization changes.  Ventricular rate 81.  02/25/2014 ECG Normal sinus rhythm.  Right bundle branch block with repolarization changes.  PR interval 148 ms; QTc interval 463 ms  PriorECG: sinus rhythm at 76 beats per minute; right bundle-branch block with repolarization changes. PR interval 142 ms, QTC 481 ms.   LABS:  BMP Latest Ref Rng & Units 06/04/2014 05/28/2014 08/06/2012  Glucose 70 - 99 mg/dL 179(H) 139(H) 111(H)  BUN 6 - 23 mg/dL 12 19 16   Creatinine 0.50 - 1.35 mg/dL 1.20 1.09 1.11  Sodium 137 - 147 mEq/L 135(L) 139 137  Potassium 3.7 - 5.3 mEq/L 4.1 4.4 3.8  Chloride 96 - 112 mEq/L 97 99 102  CO2 19 -  32 mEq/L 26 26 25   Calcium 8.4 - 10.5 mg/dL 8.6 9.8 9.4   No flowsheet data found. CBC Latest Ref Rng & Units 06/04/2014 05/28/2014 08/06/2012  WBC 4.0 - 10.5 K/uL 14.7(H) 10.5 10.8(H)  Hemoglobin 13.0 - 17.0 g/dL 12.5(L) 14.6 14.5  Hematocrit 39.0 - 52.0 % 38.8(L) 43.8 43.9  Platelets 150 - 400 K/uL 183 227 190   Lab Results  Component Value Date   MCV 89.8 06/04/2014   MCV 86.7 05/28/2014   MCV 86.9 08/06/2012   Lab Results  Component Value Date   TSH 0.406 08/05/2012  Lipid Panel     Component Value Date/Time   CHOL 257 (H) 08/05/2012 0430   TRIG 296 (H) 08/05/2012 0430   HDL 32 (L) 08/05/2012 0430   CHOLHDL 8.0 08/05/2012 0430   VLDL 59 (H) 08/05/2012 0430   LDLCALC 166 (H) 08/05/2012 0430   RADIOLOGY: No results found.    ASSESSMENT AND PLAN:  Bobby [name] is a 70 year old Caucasian male who suffered an acute coronary syndrome on 08/04/2012 when he presented with subtotal occlusion of his RCA. He had diffuse disease beyond the subtotal occlusion and ultimately had successful insertion of a 3.25x32 mm PROMUS DES stent. He had concomitant CAD with 40-50% proximal LAD narrowing, 30% circumflex marginal stenoses. NMR lipoprofile after initiation of atorvastatin in January 2014 showed marked improvement in total cholesterol of 119 LDL 48 LDL particle #992. HDL was very low at 30 with an increased triglyceride at 203 and significantly reduced HDL particle #24.2. His insulin resistance score was elevated at 65.  Unfortunately, he has continued to smoke cigarettes.  He has moderate pulmonary hypertension noted on echocardiography.  When I last saw him, I changed his Brilinta to 60 mg twice a day dosing per Pegasys trial data and he continues to be on dual antiplatelet therapy.  He is not having recurrent anginal symptomatology.  Presently, his blood pressure is controlled on HCTZ to 25 mg, amlodipine 5 mg,  lisinopril 20 mg twice a day and Toprol-XL 100 mg daily.  His resting pulse is 64  and stable.  He continues to be on atorvastatin 20 mg, omega-3 fatty acids 1000 mg for hyperlipidemia with target LDL less than 70.  His primary physician, Dr. Clearence Cheek earlier this year had reportedly  checked laboratory. I had a long discussion with him today concerning the importance of smoking cessation.  We also discussed his weight and moderate obesity with a BMI of 36.25.  We have set a weight goal of less than 205 pounds to get him under the "obesity" threshold.   We discussed exercising at least 5 days per week for minimum of 30 minutes.  He is fasting today.  I will recheck fasting blood work.  I'll also obtain a hemoglobin A1c with his history of previously documented insulin resistance.  I will contact him the results and if adjustments to his medical regimen are necessary.  I will see him in one year for reevaluation.  Time spent: 25 minutes  Troy Sine, MD, [facility]  08/02/2016 12:34 PM

## 2016-08-03 LAB — HEMOGLOBIN A1C
Hgb A1c MFr Bld: 6 % — ABNORMAL HIGH (ref <5.7)
MEAN PLASMA GLUCOSE: 126 mg/dL

## 2016-08-03 LAB — TSH: TSH: 0.24 mIU/L — ABNORMAL LOW (ref 0.40–4.50)

## 2016-08-20 ENCOUNTER — Telehealth: Payer: Self-pay | Admitting: Cardiovascular Disease

## 2016-08-20 NOTE — Telephone Encounter (Signed)
New message  Pt has lost Rx for brilinta 60mg  1 tab 2x day  Pt's sister is calling to see if we have any samples  Please call and advise

## 2016-08-20 NOTE — Telephone Encounter (Signed)
Call attempt x2. No answer.   Medication samples have been provided to the patient.  Drug name: brilinta 60mg   Qty: 3 boxes  LOT: [id number]  Exp.Date: 03/2019  Samples left at front desk for patient pick-up.   Sheral Apley M 5:03 PM 08/20/2016

## 2016-08-21 NOTE — Telephone Encounter (Signed)
Patient called & notified samples are ready for pick up.

## 2016-09-06 ENCOUNTER — Other Ambulatory Visit: Payer: Self-pay | Admitting: *Deleted

## 2016-09-06 ENCOUNTER — Telehealth: Payer: Self-pay | Admitting: Cardiovascular Disease

## 2016-09-06 ENCOUNTER — Telehealth: Payer: Self-pay | Admitting: *Deleted

## 2016-09-06 DIAGNOSIS — R7989 Other specified abnormal findings of blood chemistry: Secondary | ICD-10-CM

## 2016-09-06 NOTE — Telephone Encounter (Signed)
New Message   Returning phone call about lab results. Would like call back.

## 2016-09-06 NOTE — Telephone Encounter (Signed)
Left lab results and recommendations on home answering machine. Okay per DPR. Patient advised to call back if questions or concerns.

## 2016-09-06 NOTE — Telephone Encounter (Signed)
-----   Message from Troy Sine, MD sent at 09/05/2016  6:57 PM EST ----- TSH is minimally low;not on thyroid replacement. Cr 1.66; hold HCTZ.  Recheck in several weeks, if Cr still elevated may need to decrease lisinopril dose

## 2016-09-06 NOTE — Telephone Encounter (Signed)
Returned a call to patient's sister.went over lab results and recommendations left on machine earlier today. She voiced understanding. She also informed me that someone stole the patient's bottle of Brilinta. Wants to know what she can do. He cannot afford to have it refilled. He just got a 90 day supply. I told her that I can see if we have some samples to provide him with. If not, he may have to purchase small amounts to get him through until time for his next refill. She states that he cannot afford to do that either. I then told her that I will ask Erasmo Downer for suggestions. I will call her back tomorrow to inform her if we have samples as we are now closed for the day.

## 2016-09-07 ENCOUNTER — Telehealth: Payer: Self-pay | Admitting: *Deleted

## 2016-09-07 NOTE — Telephone Encounter (Signed)
Called to inform patient's sister that I have left 5 weeks of Brilinta 60 mg along with a free 30 day card at the front desk. She thanked me and states this might get him through until time for him to renew his prescription.

## 2017-06-18 ENCOUNTER — Observation Stay (HOSPITAL_COMMUNITY)
Admission: EM | Admit: 2017-06-18 | Discharge: 2017-06-19 | Disposition: A | Discharge status: Home / Self Care | Payer: Medicare Other | Attending: Student in an Organized Health Care Education/Training Program | Admitting: Student in an Organized Health Care Education/Training Program

## 2017-06-18 ENCOUNTER — Encounter (HOSPITAL_COMMUNITY): Payer: Self-pay | Admitting: Emergency Medicine

## 2017-06-18 ENCOUNTER — Emergency Department (HOSPITAL_COMMUNITY): Payer: Medicare Other

## 2017-06-18 DIAGNOSIS — R0602 Shortness of breath: Secondary | ICD-10-CM | POA: Insufficient documentation

## 2017-06-18 DIAGNOSIS — Z7982 Long term (current) use of aspirin: Secondary | ICD-10-CM | POA: Insufficient documentation

## 2017-06-18 DIAGNOSIS — I1310 Hypertensive heart and chronic kidney disease without heart failure, with stage 1 through stage 4 chronic kidney disease, or unspecified chronic kidney disease: Secondary | ICD-10-CM | POA: Insufficient documentation

## 2017-06-18 DIAGNOSIS — M79602 Pain in left arm: Principal | ICD-10-CM | POA: Insufficient documentation

## 2017-06-18 DIAGNOSIS — J449 Chronic obstructive pulmonary disease, unspecified: Secondary | ICD-10-CM | POA: Insufficient documentation

## 2017-06-18 DIAGNOSIS — E1122 Type 2 diabetes mellitus with diabetic chronic kidney disease: Secondary | ICD-10-CM | POA: Insufficient documentation

## 2017-06-18 DIAGNOSIS — I2510 Atherosclerotic heart disease of native coronary artery without angina pectoris: Secondary | ICD-10-CM | POA: Insufficient documentation

## 2017-06-18 DIAGNOSIS — I252 Old myocardial infarction: Secondary | ICD-10-CM | POA: Diagnosis not present

## 2017-06-18 DIAGNOSIS — Z7984 Long term (current) use of oral hypoglycemic drugs: Secondary | ICD-10-CM | POA: Insufficient documentation

## 2017-06-18 DIAGNOSIS — N183 Chronic kidney disease, stage 3 (moderate): Secondary | ICD-10-CM | POA: Diagnosis not present

## 2017-06-18 DIAGNOSIS — Z955 Presence of coronary angioplasty implant and graft: Secondary | ICD-10-CM | POA: Diagnosis not present

## 2017-06-18 DIAGNOSIS — Z79899 Other long term (current) drug therapy: Secondary | ICD-10-CM | POA: Diagnosis not present

## 2017-06-18 DIAGNOSIS — M25519 Pain in unspecified shoulder: Secondary | ICD-10-CM | POA: Diagnosis present

## 2017-06-18 DIAGNOSIS — R072 Precordial pain: Secondary | ICD-10-CM

## 2017-06-18 DIAGNOSIS — F17210 Nicotine dependence, cigarettes, uncomplicated: Secondary | ICD-10-CM | POA: Insufficient documentation

## 2017-06-18 LAB — BASIC METABOLIC PANEL
Anion gap: 7 (ref 5–15)
BUN: 28 mg/dL — AB (ref 6–20)
CO2: 24 mmol/L (ref 22–32)
CREATININE: 1.4 mg/dL — AB (ref 0.61–1.24)
Calcium: 10 mg/dL (ref 8.9–10.3)
Chloride: 108 mmol/L (ref 101–111)
GFR calc Af Amer: 57 mL/min — ABNORMAL LOW (ref >60)
GFR calc non Af Amer: 49 mL/min — ABNORMAL LOW (ref >60)
Glucose, Bld: 252 mg/dL — ABNORMAL HIGH (ref 65–99)
Potassium: 4.2 mmol/L (ref 3.5–5.1)
SODIUM: 139 mmol/L (ref 135–145)

## 2017-06-18 LAB — CBC
HCT: 43.2 % (ref 39.0–52.0)
Hemoglobin: 14.4 g/dL (ref 13.0–17.0)
MCH: 29.6 pg (ref 26.0–34.0)
MCHC: 33.3 g/dL (ref 30.0–36.0)
MCV: 88.7 fL (ref 78.0–100.0)
PLATELETS: 192 10*3/uL (ref 150–400)
RBC: 4.87 MIL/uL (ref 4.22–5.81)
RDW: 14.1 % (ref 11.5–15.5)
WBC: 9.9 10*3/uL (ref 4.0–10.5)

## 2017-06-18 LAB — CBG MONITORING, ED: GLUCOSE-CAPILLARY: 116 mg/dL — AB (ref 65–99)

## 2017-06-18 LAB — I-STAT TROPONIN, ED: Troponin i, poc: 0 ng/mL (ref 0.00–0.08)

## 2017-06-18 LAB — TROPONIN I
Troponin I: <0.03 ng/mL (ref <0.03)
Troponin I: <0.03 ng/mL (ref <0.03)
Troponin I: <0.03 ng/mL (ref <0.03)

## 2017-06-18 LAB — GLUCOSE, CAPILLARY: Glucose-Capillary: 170 mg/dL — ABNORMAL HIGH (ref 65–99)

## 2017-06-18 LAB — HEMOGLOBIN A1C
Hgb A1c MFr Bld: 6.7 % — ABNORMAL HIGH (ref 4.8–5.6)
Mean Plasma Glucose: 145.59 mg/dL

## 2017-06-18 MED ORDER — OMEGA-3-ACID ETHYL ESTERS 1 G PO CAPS
1.0000 g | ORAL_CAPSULE | Freq: Every day | ORAL | Status: DC
  Administered 2017-06-18 – 2017-06-19 (×2): 1 g via ORAL
  Filled 2017-06-18 (×2): qty 1

## 2017-06-18 MED ORDER — PNEUMOCOCCAL VAC POLYVALENT 25 MCG/0.5ML IJ INJ: 0.5000 mL | INJECTION | INTRAMUSCULAR | Status: DC

## 2017-06-18 MED ORDER — BUPROPION HCL ER (XL) 150 MG PO TB24
300.0000 mg | ORAL_TABLET | Freq: Every day | ORAL | Status: DC
  Administered 2017-06-19: 300 mg via ORAL
  Filled 2017-06-18: qty 2

## 2017-06-18 MED ORDER — NITROGLYCERIN 2 % TD OINT
1.0000 [in_us] | TOPICAL_OINTMENT | Freq: Once | TRANSDERMAL | Status: AC
  Administered 2017-06-18: 1 [in_us] via TOPICAL
  Filled 2017-06-18: qty 1

## 2017-06-18 MED ORDER — HYDROCHLOROTHIAZIDE 25 MG PO TABS
25.0000 mg | ORAL_TABLET | Freq: Every day | ORAL | Status: DC
  Administered 2017-06-18 – 2017-06-19 (×2): 25 mg via ORAL
  Filled 2017-06-18 (×2): qty 1

## 2017-06-18 MED ORDER — ALLOPURINOL 300 MG PO TABS
300.0000 mg | ORAL_TABLET | Freq: Every day | ORAL | Status: DC
  Administered 2017-06-18 – 2017-06-19 (×2): 300 mg via ORAL
  Filled 2017-06-18 (×2): qty 1

## 2017-06-18 MED ORDER — CLONAZEPAM 0.5 MG PO TABS: 1.0000 mg | ORAL_TABLET | Freq: Two times a day (BID) | ORAL | Status: DC | PRN

## 2017-06-18 MED ORDER — NITROGLYCERIN 0.4 MG SL SUBL: 0.4000 mg | SUBLINGUAL_TABLET | SUBLINGUAL | Status: DC | PRN

## 2017-06-18 MED ORDER — AMLODIPINE BESYLATE 5 MG PO TABS
5.0000 mg | ORAL_TABLET | Freq: Every day | ORAL | Status: DC
  Administered 2017-06-18 – 2017-06-19 (×2): 5 mg via ORAL
  Filled 2017-06-18 (×2): qty 1

## 2017-06-18 MED ORDER — LISINOPRIL 20 MG PO TABS
20.0000 mg | ORAL_TABLET | Freq: Every day | ORAL | Status: DC
  Administered 2017-06-18 – 2017-06-19 (×2): 20 mg via ORAL
  Filled 2017-06-18 (×2): qty 1

## 2017-06-18 MED ORDER — ONDANSETRON 4 MG PO TBDP: 4.0000 mg | ORAL_TABLET | Freq: Once | ORAL | Status: DC

## 2017-06-18 MED ORDER — OXYCODONE-ACETAMINOPHEN 5-325 MG PO TABS: 1.0000 | ORAL_TABLET | Freq: Three times a day (TID) | ORAL | Status: DC | PRN

## 2017-06-18 MED ORDER — METOPROLOL SUCCINATE ER 100 MG PO TB24
100.0000 mg | ORAL_TABLET | Freq: Every day | ORAL | Status: DC
  Administered 2017-06-18 – 2017-06-19 (×2): 100 mg via ORAL
  Filled 2017-06-18 (×2): qty 1

## 2017-06-18 MED ORDER — OXYCODONE-ACETAMINOPHEN 10-325 MG PO TABS: 1.0000 | ORAL_TABLET | Freq: Three times a day (TID) | ORAL | Status: DC | PRN

## 2017-06-18 MED ORDER — ENOXAPARIN SODIUM 40 MG/0.4ML ~~LOC~~ SOLN
40.0000 mg | SUBCUTANEOUS | Status: DC
  Administered 2017-06-18: 40 mg via SUBCUTANEOUS
  Filled 2017-06-18 (×2): qty 0.4

## 2017-06-18 MED ORDER — OXYCODONE HCL 5 MG PO TABS: 5.0000 mg | ORAL_TABLET | Freq: Three times a day (TID) | ORAL | Status: DC | PRN

## 2017-06-18 MED ORDER — TICAGRELOR 90 MG PO TABS
90.0000 mg | ORAL_TABLET | Freq: Two times a day (BID) | ORAL | Status: DC
  Administered 2017-06-18 – 2017-06-19 (×2): 90 mg via ORAL
  Filled 2017-06-18 (×2): qty 1

## 2017-06-18 MED ORDER — ATORVASTATIN CALCIUM 40 MG PO TABS
40.0000 mg | ORAL_TABLET | Freq: Every day | ORAL | Status: DC
  Administered 2017-06-18 – 2017-06-19 (×2): 40 mg via ORAL
  Filled 2017-06-18 (×2): qty 1

## 2017-06-18 MED ORDER — INSULIN ASPART 100 UNIT/ML ~~LOC~~ SOLN
0.0000 [IU] | Freq: Three times a day (TID) | SUBCUTANEOUS | Status: DC
  Administered 2017-06-19: 2 [IU] via SUBCUTANEOUS
  Administered 2017-06-19: 3 [IU] via SUBCUTANEOUS

## 2017-06-18 MED ORDER — ASPIRIN 81 MG PO CHEW
81.0000 mg | CHEWABLE_TABLET | Freq: Every day | ORAL | Status: DC
  Administered 2017-06-18 – 2017-06-19 (×2): 81 mg via ORAL
  Filled 2017-06-18 (×2): qty 1

## 2017-06-18 MED ORDER — SERTRALINE HCL 50 MG PO TABS
50.0000 mg | ORAL_TABLET | Freq: Every day | ORAL | Status: DC
  Administered 2017-06-18 – 2017-06-19 (×2): 50 mg via ORAL
  Filled 2017-06-18 (×2): qty 1

## 2017-06-18 MED ORDER — TIOTROPIUM BROMIDE MONOHYDRATE 18 MCG IN CAPS
18.0000 ug | ORAL_CAPSULE | Freq: Every day | RESPIRATORY_TRACT | Status: DC
  Administered 2017-06-19: 18 ug via RESPIRATORY_TRACT
  Filled 2017-06-18: qty 5

## 2017-06-18 MED ORDER — VITAMIN D 1000 UNITS PO TABS
2000.0000 [IU] | ORAL_TABLET | Freq: Every day | ORAL | Status: DC
  Administered 2017-06-19: 2000 [IU] via ORAL
  Filled 2017-06-18: qty 2

## 2017-06-18 MED ORDER — NICOTINE 21 MG/24HR TD PT24
21.0000 mg | MEDICATED_PATCH | Freq: Every day | TRANSDERMAL | Status: DC
  Administered 2017-06-19: 21 mg via TRANSDERMAL
  Filled 2017-06-18: qty 1

## 2017-06-18 MED ORDER — ALBUTEROL SULFATE (2.5 MG/3ML) 0.083% IN NEBU: 5.0000 mg | INHALATION_SOLUTION | RESPIRATORY_TRACT | Status: DC | PRN

## 2017-06-18 MED ORDER — INSULIN ASPART 100 UNIT/ML ~~LOC~~ SOLN: 0.0000 [IU] | Freq: Every day | SUBCUTANEOUS | Status: DC

## 2017-06-18 NOTE — Progress Notes (Signed)
Pt stable placed on monitor vss, no complaints of pain. No acute distress noted. Pt oriented to room. Daughter at bedside.

## 2017-06-18 NOTE — ED Notes (Signed)
Pt CBG was 116, , notified Evelyn(RN)

## 2017-06-18 NOTE — ED Notes (Signed)
Pt given Sprite per Jessica(RN)

## 2017-06-18 NOTE — ED Triage Notes (Signed)
Pt reports L arm pain and indigestion similar to last MI. States 1 SL nitro effective for pain. 4 baby aspirin PTA. IV in R wrist.

## 2017-06-18 NOTE — ED Notes (Signed)
Pharmacist made ware home meds unverified and unable to give, mike states he will work on this. Pt informed.
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
## 2017-06-18 NOTE — ED Notes (Signed)
Admitting at bedside 

## 2017-06-18 NOTE — ED Notes (Signed)
Dinner tray ordered for pt

## 2017-06-18 NOTE — H&P (Signed)
Date: 06/18/2017               Patient Name:  Bobby [name] MRN: [id number]  DOB: June 13, 1946 Age / Sex: 71 y.o., male   PCP: [name], MD         Medical Service: Internal Medicine Teaching Service         Attending Physician: Dr. Evette Doffing, Mallie Mussel, *    First Contact: Dr. Berneice Gandy Pager: [phone]  Second Contact: Dr. Heber Perry Pager: [phone]       After Hours (After 5p/  First Contact Pager: [phone]  weekends / holidays): Second Contact Pager: [phone]   Chief Complaint: left arm discomfort   History of Present Illness: patient is a 71 year old male with a past medical history of coronary artery disease and STEMI in December 2013, pulmonary hypertension, type 2 diabetes, hypertension, hyperlipidemia, tobacco use, COPD, CVA presenting to the hospital via EMS with a one-week history of intermittent left arm discomfort. States he couldn't sleep last night due to his left arm discomfort and decided to seek medical attention. He took sublingual nitroglycerin at home prior to EMS arrival which helped alleviate his arm discomfort. He describes the discomfort as "I'm sick in my arm." Denies any history of injury to his neck, shoulder, or left arm. Denies any prior history of neck pain. Denies having any left arm weakness or paresthesias. Denies having any chest pain, nausea, vomiting, or diaphoresis. Reports having chronic intermittent shortness of breath with minimal exertion, wheezing, and nonproductive cough. Per patient, his symptoms have been stable and not acutely worsened. Symptoms occur 2-3 times a week on average. Reports having chronic GERD and beleives his symptoms have been stable. Denies having any orthopnea or lower extremity edema. Denies any history of recent travel or calf pain/swelling. Denies having any fevers or chills. No other complaints.  Patient received 4 baby aspirins via EMS and nitroglycerin in the ED. Vital stable on arrival, except blood pressure mildly  elevated at 151/75. i-STAT troponin negative and EKG not suggestive of any acute ischemic changes.  Meds:  Current Meds  Medication Sig  . allopurinol (ZYLOPRIM) 300 MG tablet Take 300 mg by mouth daily.  [name] amLODipine (NORVASC) 5 MG tablet Take 5 mg by mouth daily.  [name] aspirin 81 MG tablet Take 81 mg by mouth daily.  [name] atorvastatin (LIPITOR) 40 MG tablet Take 40 mg by mouth daily.  [name] buPROPion (WELLBUTRIN XL) 300 MG 24 hr tablet Take 300 mg by mouth daily.  . clonazePAM (KLONOPIN) 0.5 MG tablet Take 0.5 mg by mouth See admin instructions. TAKE 1 TABLET BY MOUTH TWO TIMES A DAY FOR 1 WEEK THEN TAKE 1 TABLET BY MOUTH EVERY DAY FOR 1 WEEK  . Coenzyme Q10 (COQ-10 PO) Take 1 capsule by mouth daily.  [name] COLCRYS 0.6 MG tablet Take 0.6 mg by mouth daily.  . hydrochlorothiazide (HYDRODIURIL) 25 MG tablet Take 25 mg by mouth daily.  [name] lisinopril (PRINIVIL,ZESTRIL) 20 MG tablet Take 20 mg by mouth daily.  . metFORMIN (GLUCOPHAGE-XR) 500 MG 24 hr tablet Take 500 mg by mouth daily.  . metoprolol succinate (TOPROL-XL) 100 MG 24 hr tablet Take 100 mg by mouth daily.  . nitroGLYCERIN (NITROSTAT) 0.4 MG SL tablet Place 1 tablet (0.4 mg total) under the tongue every 5 (five) minutes x 3 doses as needed for chest pain.  [name] oxyCODONE-acetaminophen (PERCOCET) 10-325 MG per tablet Take 1 tablet by mouth 3 (three) times daily.   . sertraline (ZOLOFT)  50 MG tablet Take 50 mg by mouth daily.  . ticagrelor (BRILINTA) 90 MG TABS tablet Take 90 mg by mouth 2 (two) times daily.  [name] tiotropium (SPIRIVA) 18 MCG inhalation capsule Place 18 mcg into inhaler and inhale daily.     Allergies: Allergies as of 06/18/2017 - Review Complete 06/18/2017  Allergen Reaction Noted  . Hydrocodone Itching 05/24/2014   Past Medical History:  Diagnosis Date  . CAD (coronary artery disease),residual non obstructive disease    a. 08/2012 Inferior STEMI: RCA 99% (3.5x32 Promus DES), nonobs LAD/LCX dzs;  b. 01/2013 MV: EF 66%, small  inferoseptal artifact and possible small inferobasilar scar;  c. 03/2014 MV: fixed inf defect->diaph attenuation, no ischemia.  . DM (diabetes mellitus), type 2 new diagnosis 08/05/2012   diet control  . ED (erectile dysfunction)    surgery planned  . History of echocardiogram    a. 08/2012 Echo: EF 55-60%, possible mild basal inferior HK, mildly dil RA, PASP 103mmHg.  [name] Hyperlipidemia 08/05/2012  . Hypertensive heart disease   . MI (myocardial infarction) ([place]) 08/04/2012   acute inferior stemi secondary to RCA occlusion; PCI  . Panic attack   . Pulmonary HTN ([place])    echo 08/05/12, EF 55-60%, PA pressure 15mm  . S/P coronary artery stent placement, to RCA Promus DES 08/05/2012  . Stroke [facility])    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  . Tobacco abuse     Family History: Mother had dementia; now deceased. Father had MI and kidney disease; died at age 68.  Social History: Smoking one pack of cigarettes per day since the age of 44. Drinks liquor occasionally; last drink was a few weeks ago. Reports smoking marijuana in the 1970s, denies current illicit drug use.  Review of Systems: A complete ROS was negative except as per HPI.   Physical Exam: Blood pressure 134/60, pulse 63, temperature 97.6 F (36.4 C), temperature source Oral, resp. rate 17, height 5\' 9"  (1.753 m), weight 240 lb (108.9 kg), SpO2 100 %. Physical Exam  Constitutional: He is oriented to person, place, and time. He appears well-developed and well-nourished. No distress.  HENT:  Head: Normocephalic and atraumatic.  Mouth/Throat: Oropharynx is clear and moist.  Mucous membranes appear dry.  Eyes: Right eye exhibits no discharge. Left eye exhibits no discharge.  Neck: Neck supple. No tracheal deviation present.  Cardiovascular: Normal rate, regular rhythm and intact distal pulses.  Exam reveals no gallop and no friction rub.   No murmur heard. Pulmonary/Chest: Effort normal and breath sounds normal. No respiratory distress. He has  no wheezes. He has no rales.  Abdominal: Soft. Bowel sounds are normal. He exhibits no distension. There is no tenderness.  Musculoskeletal: He exhibits no edema.  Neurological: He is alert and oriented to person, place, and time.  Sensation to light touch intact and strength 5 out of 5 in the left upper extremity.  Skin: Skin is warm and dry.    EKG: personally reviewed my interpretation is sinus rhythm with heart rate 73 and a right bundle branch block which is similar to prior EKG from November 2017.   CXR: personally reviewed my interpretation is no active cardiopulmonary disease.  Assessment & Plan by Problem: Active Problems:   Left arm pain  71 year old male with a past medical history of coronary artery disease and STEMI in December 2013, pulmonary hypertension, type 2 diabetes, hypertension, hyperlipidemia, tobacco use, COPD, CVA presenting to the hospital via EMS with a chief complaint of left arm discomfort.  Left arm discomfort: Patient has a history of inferior STEMI in December 2013. Catheterization at that time revealed 99% stenosis of the RCA and concomitant CAD involving his LAD and circumflex vessels. He underwent DES stent placement to his RCA. Subsequent nuclear stress test studies done in June 2014 and July 2015 were low risk. He is now presenting with left arm discomfort which was alleviated by sublingual nitroglycerin. Also having intermittent dyspnea on minimal exertion. Although the patient does not have chest pain, his symptoms bring up the concern for possible stent thrombosis. However, initial troponin negative and EKG not suggestive of any acute ischemic changes. He appears euvolemic on exam and does not endorse any symptoms of heart failure. -Monitor on telemetry -Continue dual antiplatelet therapy with aspirin 81 mg daily and Ticagrelor 90 mg twice daily -Sublingual nitroglycerin prn -Continue to trend troponin -Repeat echo to assess cardiac function -Patient  will need outpatient cardiology follow-up soon after discharge. His last visit was in November 2017.  CAD: as above. -Continue home lisinopril 20 mg daily -Continue home metoprolol succinate100 mg daily   Hypertension: Mildly hypertensive with systolic in the 326Z and diastolic in the 12W. -Continue home amlodipine 5 mg daily -Continue home hydrochlorothiazide 25 mg daily -Continue home lisinopril 20 mg daily -Continue home metoprolol succinate100 mg daily   CKD 3: Cr 1.4 and GFR 49 on arrival. Cr was 1.6 ten months ago. Prior baseline 1.0 - 1.2 in 2015. This could possibly reflect progressive decline in his renal function, although there is not enough data to come to that conclusion. -Continue to monitor   Hyperlipidemia -Continue home Lipitor 40 mg daily -continue omega-3 fatty acids 1000 mg daily  Pulmonary hypertension: Likely in the setting of severe sleep apnea which was reported on sleep study done in March 2014. Last echo from December 2013 showing PA pressure 45 mmHg. No signs of right-sided heart failure at this time. -Continue to monitor  -Repeat echo pending   Severe obstructive sleep apnea: Reported on sleep study done in March 2014. Patient does not use CPAP at home -CPAP at bedtime -He will need outpatient follow-up to get a CPAP machine   Type 2 diabetes: Last A1c 6.0 in November 2017. RBG 252.  -Sliding-scale insulin moderate with bedtime coverage -Check A1c   Vitamin D deficiency -Continue home vitamin D 2000 units daily supplementation -Check vitamin D level  Anxiety and depression -Continue home clonazepam 1 mg twice daily as needed -Continue home bupropion 300 mg daily -continue home sertraline 50 mg daily  COPD: Stable -Continue home tiotropium 18 g daily -Albuterol nebulizer every 4 hours as needed  History of CVA -Continue aspirin 81 mg daily  History of gout -Continue home allopurinol 300 mg daily -Hold home colchicine and indomethacin; no  acute flare at this time   Chronic back pain -Continue home oxycodone-acetaminophen 10-325 mg 1 tablet q8 hrs as needed  Tobacco use: Long-standing history of cigarette smoking. Currently smoking 1 pack per day. -NicoDerm 21 mg patch -Continue home bupropion 300 mg daily  DVT ppx: Lovenox Diet: HH-CM  Dispo: Admit patient to Observation with expected length of stay less than 2 midnights.  Signed: Shela Leff, MD 06/18/2017, 2:30 PM  Pager: [phone]

## 2017-06-19 ENCOUNTER — Observation Stay (HOSPITAL_BASED_OUTPATIENT_CLINIC_OR_DEPARTMENT_OTHER): Payer: Medicare Other

## 2017-06-19 DIAGNOSIS — R0789 Other chest pain: Secondary | ICD-10-CM

## 2017-06-19 DIAGNOSIS — Z885 Allergy status to narcotic agent status: Secondary | ICD-10-CM

## 2017-06-19 DIAGNOSIS — E119 Type 2 diabetes mellitus without complications: Secondary | ICD-10-CM | POA: Diagnosis not present

## 2017-06-19 DIAGNOSIS — I2510 Atherosclerotic heart disease of native coronary artery without angina pectoris: Secondary | ICD-10-CM

## 2017-06-19 DIAGNOSIS — M79602 Pain in left arm: Secondary | ICD-10-CM | POA: Diagnosis not present

## 2017-06-19 DIAGNOSIS — R0602 Shortness of breath: Secondary | ICD-10-CM | POA: Diagnosis not present

## 2017-06-19 LAB — ECHOCARDIOGRAM COMPLETE
Height: 69 in
Weight: 4086.4 oz

## 2017-06-19 LAB — GLUCOSE, CAPILLARY
Glucose-Capillary: 145 mg/dL — ABNORMAL HIGH (ref 65–99)
Glucose-Capillary: 153 mg/dL — ABNORMAL HIGH (ref 65–99)

## 2017-06-19 NOTE — Plan of Care (Signed)
Problem: Health Behavior/Discharge Planning: Goal: Ability to manage health-related needs will improve Outcome: Progressing Patient educated on importance of complying with prescribed therapeutic regimen to promote optimal health and wellbeing. Verbalizes understanding.

## 2017-06-19 NOTE — Progress Notes (Signed)
Order for CPAP at night received.  Spoke with patient who states he had a previous sleep study 4 years ago.  He was not setup with a machine and does not currently wear any CPAP or Bipap at night for home use.

## 2017-06-19 NOTE — Care Management Obs Status (Signed)
[place] NOTIFICATION   Patient Details  Name: Bobby [name] MRN: [id number] Date of Birth: 1945-12-15   Medicare Observation Status Notification Given:  Yes    Bethena Roys, RN 06/19/2017, 3:23 PM

## 2017-06-19 NOTE — Consult Note (Signed)
Cardiology Consultation:   Patient ID: Bobby [name]; [id number]; [birth year]   Admit date: 06/18/2017 Date of Consult: 06/19/2017  Primary Care Provider: [name], MD Primary Cardiologist: Dr. Claiborne Billings Primary Electrophysiologist:     Patient Profile:   Bobby [name] is a 71 y.o. male with a hx of HTN, remote small TIA/CVA, inferior STEMI (08/04/12) s/p DES stent to RCA, with two normal stress tests following this intervention (2014 and 2015), echo at that time with normal function, mild basal inferior scar, and moderate pulmonary hypertension, current smoker, DM, HTN, HLD, and COPD who is being seen today for the evaluation of chest pain at the request of Dr. Rebeca Alert.  History of Present Illness:   Mr. Bobby [name] is known to this service and was recently seen by Dr. Claiborne Billings on 08/02/16. At that time, his pressure was mildly elevated. His brilinta was transitioned to 60 mg BID (per Pegasys trial). No other medication changes were made at that time and he denied recent anginal symptoms.   He presents to [facility] with 1 week of intermittent left arm discomfort. He took nitrol SL last evening with relief of his left arm discomfort. On my interview, he states that approximately one week ago he woke up with left-sdied dorsal forearm pain. He denies pain with touching or movement, but states he can't lay on his arm. He feels like he "has the flu in his arm."  On exam, he has no limitation with passive or active ROM, he is not weak and does not have neuro or muscular deficits. He states that the nitro patch the EMS applied relieved his pain. The nitro patch wore off lat evening and he denies further arm pain. He denies chest pain, palpitations, increased SOB above his baseline, orthopnea, leg edema, melena, and syncope. He states that sometimes he get dizzy when he stands up or bends over. He does not recall if he had chest pain prior to his 2013 STEMI.  Of note, he states that it hurts to lay on his left  arm. On my exam, he was found to be laying on his left arm.  Past Medical History:  Diagnosis Date  . CAD (coronary artery disease),residual non obstructive disease    a. 08/2012 Inferior STEMI: RCA 99% (3.5x32 Promus DES), nonobs LAD/LCX dzs;  b. 01/2013 MV: EF 66%, small inferoseptal artifact and possible small inferobasilar scar;  c. 03/2014 MV: fixed inf defect->diaph attenuation, no ischemia.  . DM (diabetes mellitus), type 2 new diagnosis 08/05/2012   diet control  . ED (erectile dysfunction)    surgery planned  . History of echocardiogram    a. 08/2012 Echo: EF 55-60%, possible mild basal inferior HK, mildly dil RA, PASP 8mmHg.  [name] Hyperlipidemia 08/05/2012  . Hypertensive heart disease   . MI (myocardial infarction) ([place]) 08/04/2012   acute inferior stemi secondary to RCA occlusion; PCI  . Panic attack   . Pulmonary HTN ([place])    echo 08/05/12, EF 55-60%, PA pressure 45mm  . S/P coronary artery stent placement, to RCA Promus DES 08/05/2012  . Stroke [facility])    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  . Tobacco abuse     Past Surgical History:  Procedure Laterality Date  . CARDIAC CATHETERIZATION  08/04/12   PCI to RCA with DES  . LEFT HEART CATHETERIZATION WITH CORONARY ANGIOGRAM N/A 08/04/2012   Procedure: LEFT HEART CATHETERIZATION WITH CORONARY ANGIOGRAM;  Surgeon: Troy Sine, MD;  Location: [facility] CATH LAB;  Service: Cardiovascular;  Laterality: N/A;  .  LEG SURGERY Left    rod placed for fracture repair  . PENILE PROSTHESIS IMPLANT N/A 06/03/2014   Procedure: IMPLANT PENILE PROTHESIS INFLATABLE;  Surgeon: Ailene Rud, MD;  Location: [facility] ORS;  Service: Urology;  Laterality: N/A;  with penile block--0.5% marcaine plain  . PERCUTANEOUS CORONARY STENT INTERVENTION (PCI-S) Right 08/04/2012   Procedure: PERCUTANEOUS CORONARY STENT INTERVENTION (PCI-S);  Surgeon: Troy Sine, MD;  Location: [facility] CATH LAB;  Service: Cardiovascular;  Laterality: Right;  . THUMB ARTHROSCOPY Left    thumb  joint replaced     Home Medications:  Prior to Admission medications   Medication Sig Start Date End Date Taking? Authorizing Provider  allopurinol (ZYLOPRIM) 300 MG tablet Take 300 mg by mouth daily.   Yes [provider]  amLODipine (NORVASC) 5 MG tablet Take 5 mg by mouth daily. 08/17/16  Yes [provider]  aspirin 81 MG tablet Take 81 mg by mouth daily.   Yes [provider]  atorvastatin (LIPITOR) 40 MG tablet Take 40 mg by mouth daily. 03/21/17  Yes [provider]  buPROPion (WELLBUTRIN XL) 300 MG 24 hr tablet Take 300 mg by mouth daily. 03/21/17  Yes [provider]  clonazePAM (KLONOPIN) 0.5 MG tablet Take 0.5 mg by mouth See admin instructions. TAKE 1 TABLET BY MOUTH TWO TIMES A DAY FOR 1 WEEK THEN TAKE 1 TABLET BY MOUTH EVERY DAY FOR 1 WEEK 06/01/17  Yes [provider]  Coenzyme Q10 (COQ-10 PO) Take 1 capsule by mouth daily.   Yes [provider]  COLCRYS 0.6 MG tablet Take 0.6 mg by mouth daily. 03/21/17  Yes [provider]  hydrochlorothiazide (HYDRODIURIL) 25 MG tablet Take 25 mg by mouth daily. 03/21/17  Yes [provider]  lisinopril (PRINIVIL,ZESTRIL) 20 MG tablet Take 20 mg by mouth daily. 08/17/16  Yes [provider]  metFORMIN (GLUCOPHAGE-XR) 500 MG 24 hr tablet Take 500 mg by mouth daily. 03/21/17  Yes [provider]  metoprolol succinate (TOPROL-XL) 100 MG 24 hr tablet Take 100 mg by mouth daily. 08/17/16  Yes [provider]  nitroGLYCERIN (NITROSTAT) 0.4 MG SL tablet Place 1 tablet (0.4 mg total) under the tongue every 5 (five) minutes x 3 doses as needed for chest pain. 01/05/14  Yes Troy Sine, MD  oxyCODONE-acetaminophen (PERCOCET) 10-325 MG per tablet Take 1 tablet by mouth 3 (three) times daily.    Yes [provider]  sertraline (ZOLOFT) 50 MG tablet Take 50 mg by mouth daily. 03/21/17  Yes [provider]  ticagrelor (BRILINTA) 90 MG TABS  tablet Take 90 mg by mouth 2 (two) times daily. 08/17/16  Yes [provider]  tiotropium (SPIRIVA) 18 MCG inhalation capsule Place 18 mcg into inhaler and inhale daily.   Yes [provider]  albuterol (PROVENTIL) (2.5 MG/3ML) 0.083% nebulizer solution Take 5 mg by nebulization every 4 (four) hours as needed. For wheezing or SOB    [provider]  cholecalciferol (VITAMIN D) 1000 UNITS tablet Take 2,000 Units by mouth daily.    [provider]  clonazePAM (KLONOPIN) 1 MG tablet Take 1 mg by mouth 2 (two) times daily as needed for anxiety.     [provider]  indomethacin (INDOCIN) 25 MG capsule Take 1 capsule (25 mg total) by mouth 3 (three) times daily as needed. 07/16/15   Dorie Rank, MD  Omega-3 Fatty Acids (FISH OIL) 1000 MG CPDR Take by mouth.    [provider]  tetrahydrozoline (VISINE) 0.05 % ophthalmic solution Place 1 drop into both eyes once as needed (dry/red eyes.).    [provider]    Inpatient Medications: Scheduled Meds: . allopurinol  300 mg Oral Daily  . amLODipine  5 mg Oral Daily  . aspirin  81 mg Oral Daily  . atorvastatin  40 mg Oral Daily  . buPROPion  300 mg Oral Daily  . cholecalciferol  2,000 Units Oral Daily  . enoxaparin (LOVENOX) injection  40 mg Subcutaneous Q24H  . hydrochlorothiazide  25 mg Oral Daily  . insulin aspart  0-15 Units Subcutaneous TID WC  . insulin aspart  0-5 Units Subcutaneous QHS  . lisinopril  20 mg Oral Daily  . metoprolol succinate  100 mg Oral Daily  . nicotine  21 mg Transdermal Daily  . omega-3 acid ethyl esters  1 g Oral Daily  . pneumococcal 23 valent vaccine  0.5 mL Intramuscular Tomorrow-1000  . sertraline  50 mg Oral Daily  . ticagrelor  90 mg Oral BID  . tiotropium  18 mcg Inhalation Daily   Continuous Infusions:  PRN Meds: albuterol, clonazePAM, nitroGLYCERIN, oxyCODONE-acetaminophen **AND** oxyCODONE  Allergies:    Allergies  Allergen Reactions  .  Hydrocodone Itching    Social History:   Social History   Social History  . Marital status: Legally Separated    Spouse name: N/A  . Number of children: N/A  . Years of education: N/A   Occupational History  . Not on file.   Social History Main Topics  . Smoking status: Current Every Day Smoker    Packs/day: 1.00    Years: 56.00    Types: Cigarettes  . Smokeless tobacco: Never Used     Comment: smoking now 1 ppd  . Alcohol use Yes     Comment: ocasionally  . Drug use: No  . Sexual activity: Not on file   Other Topics Concern  . Not on file   Social History Narrative  . No narrative on file    Family History:    Family History  Problem Relation Age of Onset  . Kidney disease Father        kidney disease and heart disease  . Heart disease Maternal Grandmother   . Stomach cancer Maternal Grandfather   . Heart attack Paternal Grandmother      ROS:  Please see the history of present illness.  ROS  All other ROS reviewed and negative.     Physical Exam/Data:   Vitals:   06/18/17 1455 06/18/17 1855 06/18/17 2124 06/19/17 0455  BP: 97/62 132/74 133/66 (!) 137/50  Pulse: 73 60 65 71  Resp: 17     Temp:  (!) 97.4 F (36.3 C) 98.3 F (36.8 C) 98.2 F (36.8 C)  TempSrc:  Oral Oral Oral  SpO2: 100% 97% 95% 95%  Weight:    255 lb 6.4 oz (115.8 kg)  Height:       No intake or output data in the 24 hours ending 06/19/17 0907 Filed Weights   06/18/17 0655 06/19/17 0455  Weight: 240 lb (108.9 kg) 255 lb 6.4 oz (115.8 kg)   Body mass index is 37.72 kg/m.  General:  Well nourished, well developed, in no acute distress HEENT: normal Neck: no JVD Vascular: No carotid bruits Cardiac:  normal S1, S2; RRR; no murmur  Lungs:  clear to auscultation bilaterally, no wheezing, rhonchi or rales but diminished in bases Abd: soft, nontender, no hepatomegaly  Ext: no edema  Musculoskeletal:  No deformities, BUE and BLE strength normal and equal, BUE ROM intact Skin: warm  and dry  Neuro:  CNs 2-12 intact, no focal abnormalities noted Psych:  Normal affect   EKG:  The EKG was personally reviewed and demonstrates:  Sinus, PACS, RBBB (not new) Telemetry:  Telemetry was personally reviewed and demonstrates:  sinus with PACs and questionable sinus arrhythmia  Relevant CV Studies:  Myoview 03/26/14: Low risk stress nuclear study with diaphragmatic attenuation artifact.  Myoview 02/26/13: Low risk stress nuclear study with fixed inferoseptal bowel artifact and possible small area of inferobasal scar.. LV Wall Motion:  NL LV Function; NL Wall Motion; EF 66%.  Heart cath 08/05/12: IMPRESSION: 1. Acute inferior ST-segment elevation myocardial infarction secondary     to subtotal occlusion of the right coronary artery (nondominant). 2. Multivessel coronary artery disease at 40-50% narrowing in the     proximal LAD, 20-30% narrowing in a OM2 branch of the dominant left     circumflex coronary artery and subtotal occlusion stenosis of the     right coronary artery with evidence for dissection beyond the     initial lesion. 3. Successful percutaneous coronary intervention to the right coronary     artery with ultimate insertion of a 2.25 x32 mm PROMUS DES stent     post dilated at 2.38 mm tapering to 2.30 mm. 4. Angiomax/IC and IV nitroglycerin/180 mg oral Brilinta. 5. Total balloon time 29 minutes.   Laboratory Data:  Chemistry Recent Labs Lab 06/18/17 0709  NA 139  K 4.2  CL 108  CO2 24  GLUCOSE 252*  BUN 28*  CREATININE 1.40*  CALCIUM 10.0  GFRNONAA 49*  GFRAA 57*  ANIONGAP 7    No results for input(s): PROT, ALBUMIN, AST, ALT, ALKPHOS, BILITOT in the last 168 hours. Hematology Recent Labs Lab 06/18/17 0709  WBC 9.9  RBC 4.87  HGB 14.4  HCT 43.2  MCV 88.7  MCH 29.6  MCHC 33.3  RDW 14.1  PLT 192   Cardiac Enzymes Recent Labs Lab 06/18/17 1047 06/18/17 1703 06/18/17 2158  TROPONINI <0.03 <0.03 <0.03    Recent Labs Lab  06/18/17 0724  TROPIPOC 0.00    BNPNo results for input(s): BNP, PROBNP in the last 168 hours.  DDimer No results for input(s): DDIMER in the last 168 hours.  Radiology/Studies:  Dg Chest 2 View  Result Date: 06/18/2017 CLINICAL DATA:  Chest pain and shortness of breath. EXAM: CHEST  2 VIEW COMPARISON:  Chest x-ray dated May 28, 2014. FINDINGS: The cardiomediastinal silhouette is normal in size. Normal pulmonary vascularity. Atherosclerotic calcification of the aortic arch. No focal consolidation, pleural effusion, or pneumothorax. No acute osseous abnormality. IMPRESSION: No active cardiopulmonary disease. Electronically Signed   By: Titus Dubin M.D.   On: 06/18/2017 07:57    Assessment and Plan:   1. Chest pain, hx of inferior STEMI s/p stent to RCA (2013), s/p negative myoviews x 2 - troponin x 4 negative - EKG sinus with RBBB that is not new Patient has known disease, but is describing very atypical discomfort in his forearm. He states that nitro at home and nitro from EMS has relieved his arm pain, but it still hurts to lay on his arm. I discussed possible injury and muscular strain with him. He is not active at home and does not recall recent injury. He is not NPO and has taken his home beta blocker within the past 18 hrs. I discussed repeating the myoview stress  test and he stated he wanted to be discharged. We have low suspicion for an acute ACS process at this time given his negative troponin and unchanged EKG. Continue ASA, lipitor, and brilinta 60 mg BID.   2. COPD - breathing is at his baseline - he is inactive at home   3. HTN - continue home norvasc, HCTZ, lisinopril, toprol,    4. HLD - continue lipitor   5. DM - continue home PO medications   6. Current smoker - encouraged cessation   For questions or updates, please contact [place] Please consult [email or web address] for contact info under Cardiology/STEMI.   Signed, [place], [place]   06/19/2017 9:07 AM  Personally seen and examined. Agree with above.  71 year old with prior inferior ST elevation myocardial infarction, CAD, morbid obesity, COPD, continued smoker, diabetic, hypertensive, hyperlipidemic here with left arm discomfort. He describes having "the flu in his arm ". It has resolved. He had no chest discomfort or other anginal-like symptoms. He was concerned because he states that nitroglycerin seemed to help alleviate this pain.  EKG is unremarkable, occasional PACs. Mild bradycardia noted during night likely obstructive sleep apnea related.  Troponins are normal.  Physical exam: Obese male, alert. Neurologic exam/musculoskeletal exam as noted above. Normal strength. I was able to squeeze his arm without any difficulty, no pain sensation. No evidence of myositis.  Atypical left arm discomfort  - Could've been transient neuropathic discomfort, perhaps compression. Regardless, this pain is now alleviated. It was not exertional in character. Do not believe that this is associated with angina. I'm unsure truthfully whether or not the nitroglycerin helped his discomfort. Regardless, he has had prior nuclear stress test which shows old inferior STEMI scar with no ischemia.   Morbid obesity  - Continue to encourage weight loss.  Tobacco use  - Cessation discussed. Increases his cardiovascular risk.  We will have him follow-up in clinic I discussed with primary team. Okay with discharge.  Candee Furbish, MD

## 2017-06-19 NOTE — Discharge Summary (Signed)
Name: Bobby [name] MRN: [id number] DOB: 1945/09/28 71 y.o. PCP: [name], MD  Date of Admission: 06/18/2017  6:54 AM Date of Discharge: 06/19/2017 Attending Physician: Axel Filler, *  Discharge Diagnosis:  Active Problems:   Left arm pain  Discharge Medications: Allergies as of 06/19/2017      Reactions   Hydrocodone Itching      Medication List    TAKE these medications   albuterol (2.5 MG/3ML) 0.083% nebulizer solution Commonly known as:  PROVENTIL Take 5 mg by nebulization every 4 (four) hours as needed. For wheezing or SOB   allopurinol 300 MG tablet Commonly known as:  ZYLOPRIM Take 300 mg by mouth daily.   amLODipine 5 MG tablet Commonly known as:  NORVASC Take 5 mg by mouth daily.   aspirin 81 MG tablet Take 81 mg by mouth daily.   atorvastatin 40 MG tablet Commonly known as:  LIPITOR Take 40 mg by mouth daily.   buPROPion 300 MG 24 hr tablet Commonly known as:  WELLBUTRIN XL Take 300 mg by mouth daily.   cholecalciferol 1000 units tablet Commonly known as:  VITAMIN D Take 2,000 Units by mouth daily.   clonazePAM 0.5 MG tablet Commonly known as:  KLONOPIN Take 0.5 mg by mouth See admin instructions. TAKE 1 TABLET BY MOUTH TWO TIMES A DAY FOR 1 WEEK THEN TAKE 1 TABLET BY MOUTH EVERY DAY FOR 1 WEEK What changed:  Another medication with the same name was removed. Continue taking this medication, and follow the directions you see here.   COLCRYS 0.6 MG tablet Generic drug:  colchicine Take 0.6 mg by mouth daily.   COQ-10 PO Take 1 capsule by mouth daily.   Fish Oil 1000 MG Cpdr Take by mouth.   hydrochlorothiazide 25 MG tablet Commonly known as:  HYDRODIURIL Take 25 mg by mouth daily.   indomethacin 25 MG capsule Commonly known as:  INDOCIN Take 1 capsule (25 mg total) by mouth 3 (three) times daily as needed.   lisinopril 20 MG tablet Commonly known as:  PRINIVIL,ZESTRIL Take 20 mg by mouth daily.   metFORMIN 500 MG  24 hr tablet Commonly known as:  GLUCOPHAGE-XR Take 500 mg by mouth daily.   metoprolol succinate 100 MG 24 hr tablet Commonly known as:  TOPROL-XL Take 100 mg by mouth daily.   nitroGLYCERIN 0.4 MG SL tablet Commonly known as:  NITROSTAT Place 1 tablet (0.4 mg total) under the tongue every 5 (five) minutes x 3 doses as needed for chest pain.   oxyCODONE-acetaminophen 10-325 MG tablet Commonly known as:  PERCOCET Take 1 tablet by mouth 3 (three) times daily.   sertraline 50 MG tablet Commonly known as:  ZOLOFT Take 50 mg by mouth daily.   ticagrelor 90 MG Tabs tablet Commonly known as:  BRILINTA Take 90 mg by mouth 2 (two) times daily.   tiotropium 18 MCG inhalation capsule Commonly known as:  SPIRIVA Place 18 mcg into inhaler and inhale daily.   VISINE 0.05 % ophthalmic solution Generic drug:  tetrahydrozoline Place 1 drop into both eyes once as needed (dry/red eyes.).      Disposition and follow-up:   Bobby [name] was discharged from [facility] in Good condition.  At the hospital follow up visit please address:  1.  Patient presented with left arm pain that was relieved with nitroglycerin and associated with SOB. Given the patient's extensive cardiac history and multiple risk factors for ACS, the patient was  admitted to rule out ACS. The patient's workup was reassuring and no signs of NSTEMI or STEMI were observed during hospitalization. Please assess the patients symptoms of L arm pain and shortness of breath.  2.  Labs / imaging needed at time of follow-up: None  3.  Pending labs/ test needing follow-up: None  Follow-up Appointments: Follow-up Information    [name], [place] Follow up on 07/01/2017.   Specialties:  Cardiology, Radiology Why:  9:30 am hospital follow up Contact information: [address] [name] [address] [phone]        [name], MD. Call.   Specialty:  Family Medicine Why:  Please make an  appointment in 1 week regarding your hospitalization.  Contact information: [place] [address] [phone]          Hospital Course by problem list: Active Problems:   Left arm pain   L arm pain and SOB, concerning for atypical chest pain: Patient has extensive cardiac history and presented with myalgias of L upper extremity which were relieved with sublingual nitroglycerin and associated with SOB. The patient's presentation was concerning for ACS given his cardiac history and story of pain that was relieved by sublingual nitroglycerin. The patient never endorsed chest pain during these events and his arm pain was not exertional in nature. Overnight his troponin levels were negative and EKG unchanged from EKG on admission, which was overall reassuring to rule out ACS. His echocardiography revealed LVEF 55-60% with grade 2 diastolic dysfunction. This was unlikely to be the cause of his L arm pain, however may be associated with patient's SOB. It was thought that the patient's arm pain was MSK in origin because on subsequent interview the patient denied any chest pain and described his pain as a myalgia rather than pressure that radiated in chest. A cardiology consult agreed that the patient's presentation was unlikely secondary to ACS. They suggested continuing current lifestyle modifications and medications for risk reduction upon discharge. They also recommended a repeat stress test in the outpatient setting given the patient's extensive coronary history and the fact that the patient has not had stress testing in >2 years. The patient had cardiac follow up scheduled at the time of discharge.   T2DM: Patient's A1C was 6.7% on admission, within goal for reducing risk of future adverse cardiac events. Further management of diabetes was deferred to outpatient primary care physician.   Discharge Vitals:   BP 135/71   Pulse 70   Temp 97.8  F (36.6 C) (Oral)   Resp 20   Ht 5\' 9"  (1.753 m)   Wt 255 lb 6.4 oz (115.8 kg)   SpO2 96%   BMI 37.72 kg/m   Pertinent Labs, Studies, and Procedures:   BMP BMP Latest Ref Rng & Units 06/18/2017 08/02/2016 06/04/2014  Glucose 65 - 99 mg/dL 252(H) 102(H) 179(H)  BUN 6 - 20 mg/dL 28(H) 28(H) 12  Creatinine 0.61 - 1.24 mg/dL 1.40(H) 1.66(H) 1.20  Sodium 135 - 145 mmol/L 139 138 135(L)  Potassium 3.5 - 5.1 mmol/L 4.2 4.5 4.1  Chloride 101 - 111 mmol/L 108 105 97  CO2 22 - 32 mmol/L 24 23 26   Calcium 8.9 - 10.3 mg/dL 10.0 9.2 8.6   CBC CBC Latest Ref Rng & Units 06/18/2017 08/02/2016 06/04/2014  WBC 4.0 - 10.5 K/uL 9.9 12.5(H) 14.7(H)  Hemoglobin 13.0 - 17.0 g/dL 14.4 13.9 12.5(L)  Hematocrit 39.0 - 52.0 % 43.2 41.8 38.8(L)  Platelets  150 - 400 K/uL 192 234 183   Troponin <0.03 x3  Hemoglobin A1c = 6.7%  Echocardiography: Study Conclusions - Left ventricle: The cavity size was normal. Wall thickness was   increased in a pattern of mild LVH. Systolic function was normal.   The estimated ejection fraction was in the range of 55% to 60%.   Wall motion was normal; there were no regional wall motion   abnormalities. Features are consistent with a pseudonormal left   ventricular filling pattern, with concomitant abnormal relaxation   and increased filling pressure (grade 2 diastolic dysfunction). - Mitral valve: Calcified annulus.  Impressions: - Normal LV systolic function; mild LVH; moderate diastolic   dysfunction; no TR to estimate pulmonary pressure.  Discharge Instructions: Discharge Instructions    Call MD for:  difficulty breathing, headache or visual disturbances    Complete by:  As directed    Call MD for:  persistant dizziness or light-headedness    Complete by:  As directed    Call MD for:  persistant nausea and vomiting    Complete by:  As directed    Call MD for:  temperature >100.4    Complete by:  As directed    Diet - low sodium heart healthy    Complete  by:  As directed    Discharge instructions    Complete by:  As directed    You were evaluated for left arm pain and shortness of breath, which can sometimes be signs of heart problems. Your workup was reassuring that you did not have a heart attack. You were seen by the cardiologist while you were in the hospital. They would like to see you as an outpatient, so please follow the instructions on this paper work to follow up with them after you leave the hospital. Please also follow up with your primary care physician regarding your hospitalization.   Increase activity slowly    Complete by:  As directed       Signed: Thomasene Ripple, MD 06/19/2017, 3:35 PM   Pager: [phone]

## 2017-06-19 NOTE — Progress Notes (Signed)
Pt discharged home. PIV removed. AVS reviewed. Pt without complaint of chest pain, SOB upon dc. Pt to follow up with PCP next week. Pt to be transported home via friend.

## 2017-06-19 NOTE — Progress Notes (Signed)
   Subjective:  Patient was seen laying comfortably in bed this AM. He states that his left arm pain has resolved and that he feels much better today. He had no events of chest pain, palpitations, nausea, vomiting, or SOB overnight.   Objective:  Vital signs in last 24 hours: Vitals:   06/18/17 2124 06/19/17 0455 06/19/17 0909 06/19/17 0952  BP: 133/66 (!) 137/50  (!) 156/69  Pulse: 65 71  69  Resp:      Temp: 98.3 F (36.8 C) 98.2 F (36.8 C)    TempSrc: Oral Oral    SpO2: 95% 95% 96% 96%  Weight:  255 lb 6.4 oz (115.8 kg)    Height:       Physical Exam  Constitutional:  Overweight gentleman sitting comfortably in bed in no acute distress  HENT:  Mouth/Throat: Oropharynx is clear and moist.  Cardiovascular: Normal rate and regular rhythm.  Exam reveals no friction rub.   No murmur heard. 2+ radial pulses, 2+ posterior tibial pulses  Pulmonary/Chest: Effort normal and breath sounds normal.  Abdominal: Soft. Bowel sounds are normal. He exhibits no distension. There is no tenderness. There is no guarding.  Musculoskeletal: He exhibits no edema (of bilateral lower extremities) or tenderness (of bilateral lower extremities).  Skin: Skin is warm and dry. Capillary refill takes less than 2 seconds. No rash noted. No erythema. No pallor.   Assessment/Plan:  Active Problems:   Left arm pain  Atypical L arm pain with associated SOB: Patient has extensive cardiac history and presented with myalgias of L upper extremity which were relieved with sublingual nitroglycerin and associated with SOB. The patient's presentation was concerning for ACS given his cardiac history and story of pain that was relieved by sublingual nitroglycerin. The patient never endorsed chest pain during these events and his arm pain was not exertional in nature. Overnight his troponin levels were negative and EKG unchanged from yesterday. Cardiology consult suggests continuing current lifestyle modifications and  medications for risk reduction. Cardiolgoy recommends repeat stress test as outpatient given patient's extensive coronary history.  -Continue home medications including ASA, Lipitor, and Brilinta for known CAD -Follow up as outpatient for repeat myoview stress test  T2DM: Patient's A1C was 6.7% on admission. Patient was encouraged to continue diet and lifestyle modifications for T2DM management. Will encourage patient to continue current medication regimen and defer further management to PCP, with whom the patient will have close follow up.   Dispo: Anticipated discharge today.   Thomasene Ripple, MD 06/19/2017, 12:13 PM Pager: [phone]

## 2017-06-19 NOTE — Progress Notes (Signed)
  Echocardiogram 2D Echocardiogram has been performed.  Bobby [name] 06/19/2017, 11:20 AM

## 2017-06-20 LAB — VITAMIN D 25 HYDROXY (VIT D DEFICIENCY, FRACTURES): Vit D, 25-Hydroxy: 24.3 ng/mL — ABNORMAL LOW (ref 30.0–100.0)

## 2017-07-01 ENCOUNTER — Ambulatory Visit (INDEPENDENT_AMBULATORY_CARE_PROVIDER_SITE_OTHER): Payer: Medicare Other | Admitting: Physician Assistant

## 2017-07-01 ENCOUNTER — Encounter: Payer: Self-pay | Admitting: Physician Assistant

## 2017-07-01 VITALS — BP 158/82 | HR 76 | Ht 69.0 in | Wt 259.0 lb

## 2017-07-01 DIAGNOSIS — E119 Type 2 diabetes mellitus without complications: Secondary | ICD-10-CM | POA: Diagnosis not present

## 2017-07-01 DIAGNOSIS — E785 Hyperlipidemia, unspecified: Secondary | ICD-10-CM | POA: Diagnosis not present

## 2017-07-01 DIAGNOSIS — I10 Essential (primary) hypertension: Secondary | ICD-10-CM | POA: Diagnosis not present

## 2017-07-01 DIAGNOSIS — Z720 Tobacco use: Secondary | ICD-10-CM

## 2017-07-01 DIAGNOSIS — I2510 Atherosclerotic heart disease of native coronary artery without angina pectoris: Secondary | ICD-10-CM

## 2017-07-01 NOTE — Progress Notes (Signed)
Cardiology Office Note    Date:  07/03/2017   ID:  Bobby [name], DOB 08/06/1946, MRN [id number]  PCP:  [name], MD  Cardiologist:  Dr. Claiborne Billings    Chief Complaint  Patient presents with  . Follow-up    Post hospital f/u. Seen for Dr. Claiborne Billings  . Shortness of Breath    History of Present Illness:  Bobby [name] is a 71 y.o. male with PMH of DM II, HTN, HLD, pulmonary HTN, remote small TIA/CVA and CAD. He had inferior MI in December 2013 and underwent DES to RCA. Echocardiogram done at the time showed EF 50-60% with mid to basal inferior probable scar in the moderate pulmonary hypertension with PA pressure 45 mmHg. Last Myoview obtained on 03/26/2014 was low risk showed normal LV function in the normal wall motion. Fixed inferior defect most consistent with diaphragmatic attenuation, no evidence of ischemia. Abdominal ultrasound in 2017 showed normal abdominal aorta. He was last seen by Dr. Claiborne Billings on 08/02/2016, his Brilinta was transitioned to 60 mg twice a day.  He recently went to the hospital for left arm pain. This was not his usual angina symptom. It did not occur with exertion. Troponin was negative. Echocardiogram showed normal EF 55-60%, mild LVH, grade 2 diastolic dysfunction. Otherwise, given his atypical symptom and normal echocardiogram no further workup was felt to be necessary. Since discharge, he has not had any further symptom. His lung is clear, unfortunately he continued to smoke at this time. We discussed tobacco cessation. As far as his blood pressure, initial blood pressure was elevated at 158/82. Manual recheck by myself was 144/74. He has not taken any of his blood pressure medication this morning yet, I will hold off on uptitrating his blood pressure medication. He can follow-up with Dr. Claiborne Billings in 6 months. We will request his fasting lipid panel and LFT from his PCPs office.   Past Medical History:  Diagnosis Date  . CAD (coronary artery disease),residual non  obstructive disease    a. 08/2012 Inferior STEMI: RCA 99% (3.5x32 Promus DES), nonobs LAD/LCX dzs;  b. 01/2013 MV: EF 66%, small inferoseptal artifact and possible small inferobasilar scar;  c. 03/2014 MV: fixed inf defect->diaph attenuation, no ischemia.  . DM (diabetes mellitus), type 2 new diagnosis 08/05/2012   diet control  . ED (erectile dysfunction)    surgery planned  . History of echocardiogram    a. 08/2012 Echo: EF 55-60%, possible mild basal inferior HK, mildly dil RA, PASP 61mmHg.  [name] Hyperlipidemia 08/05/2012  . Hypertensive heart disease   . MI (myocardial infarction) ([place]) 08/04/2012   acute inferior stemi secondary to RCA occlusion; PCI  . Panic attack   . Pulmonary HTN ([place])    echo 08/05/12, EF 55-60%, PA pressure 75mm  . S/P coronary artery stent placement, to RCA Promus DES 08/05/2012  . Stroke [facility])    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  . Tobacco abuse     Past Surgical History:  Procedure Laterality Date  . CARDIAC CATHETERIZATION  08/04/12   PCI to RCA with DES  . LEFT HEART CATHETERIZATION WITH CORONARY ANGIOGRAM N/A 08/04/2012   Procedure: LEFT HEART CATHETERIZATION WITH CORONARY ANGIOGRAM;  Surgeon: Troy Sine, MD;  Location: [facility] CATH LAB;  Service: Cardiovascular;  Laterality: N/A;  . LEG SURGERY Left    rod placed for fracture repair  . PENILE PROSTHESIS IMPLANT N/A 06/03/2014   Procedure: IMPLANT PENILE PROTHESIS INFLATABLE;  Surgeon: Ailene Rud, MD;  Location: [facility] ORS;  Service: Urology;  Laterality: N/A;  with penile block--0.5% marcaine plain  . PERCUTANEOUS CORONARY STENT INTERVENTION (PCI-S) Right 08/04/2012   Procedure: PERCUTANEOUS CORONARY STENT INTERVENTION (PCI-S);  Surgeon: Troy Sine, MD;  Location: [facility] CATH LAB;  Service: Cardiovascular;  Laterality: Right;  . THUMB ARTHROSCOPY Left    thumb joint replaced    Current Medications: Outpatient Medications Prior to Visit  Medication Sig Dispense Refill  . albuterol (PROVENTIL) (2.5 MG/3ML)  0.083% nebulizer solution Take 5 mg by nebulization every 4 (four) hours as needed. For wheezing or SOB    . allopurinol (ZYLOPRIM) 300 MG tablet Take 300 mg by mouth daily.    [name] amLODipine (NORVASC) 5 MG tablet Take 5 mg by mouth daily.    [name] aspirin 81 MG tablet Take 81 mg by mouth daily.    [name] atorvastatin (LIPITOR) 40 MG tablet Take 40 mg by mouth daily.  3  . buPROPion (WELLBUTRIN XL) 300 MG 24 hr tablet Take 300 mg by mouth daily.  3  . cholecalciferol (VITAMIN D) 1000 UNITS tablet Take 2,000 Units by mouth daily.    . clonazePAM (KLONOPIN) 0.5 MG tablet Take 0.5 mg by mouth See admin instructions. TAKE 1 TABLET BY MOUTH TWO TIMES A DAY FOR 1 WEEK THEN TAKE 1 TABLET BY MOUTH EVERY DAY FOR 1 WEEK  0  . Coenzyme Q10 (COQ-10 PO) Take 1 capsule by mouth daily.    [name] COLCRYS 0.6 MG tablet Take 0.6 mg by mouth daily.  3  . hydrochlorothiazide (HYDRODIURIL) 25 MG tablet Take 25 mg by mouth daily.  3  . indomethacin (INDOCIN) 25 MG capsule Take 1 capsule (25 mg total) by mouth 3 (three) times daily as needed. 21 capsule 0  . lisinopril (PRINIVIL,ZESTRIL) 20 MG tablet Take 20 mg by mouth daily.    . metFORMIN (GLUCOPHAGE-XR) 500 MG 24 hr tablet Take 500 mg by mouth daily.  3  . metoprolol succinate (TOPROL-XL) 100 MG 24 hr tablet Take 100 mg by mouth daily.    . nitroGLYCERIN (NITROSTAT) 0.4 MG SL tablet Place 1 tablet (0.4 mg total) under the tongue every 5 (five) minutes x 3 doses as needed for chest pain. 25 tablet 4  . Omega-3 Fatty Acids (FISH OIL) 1000 MG CPDR Take by mouth.    . oxyCODONE-acetaminophen (PERCOCET) 10-325 MG per tablet Take 1 tablet by mouth 3 (three) times daily.     . sertraline (ZOLOFT) 50 MG tablet Take 50 mg by mouth daily.  3  . tetrahydrozoline (VISINE) 0.05 % ophthalmic solution Place 1 drop into both eyes once as needed (dry/red eyes.).    [name] ticagrelor (BRILINTA) 90 MG TABS tablet Take 90 mg by mouth 2 (two) times daily.    [name] tiotropium (SPIRIVA) 18 MCG inhalation  capsule Place 18 mcg into inhaler and inhale daily.     No facility-administered medications prior to visit.      Allergies:   Hydrocodone   Social History   Social History  . Marital status: Legally Separated    Spouse name: N/A  . Number of children: N/A  . Years of education: N/A   Social History Main Topics  . Smoking status: Current Every Day Smoker    Packs/day: 1.00    Years: 56.00    Types: Cigarettes  . Smokeless tobacco: Never Used     Comment: smoking now 1 ppd  . Alcohol use Yes     Comment: ocasionally  . Drug use: No  .  Sexual activity: Not Asked   Other Topics Concern  . None   Social History Narrative  . None     Family History:  The patient's family history includes Heart attack in his paternal grandmother; Heart disease in his maternal grandmother; Kidney disease in his father; Stomach cancer in his maternal grandfather.   ROS:   Please see the history of present illness.    ROS All other systems reviewed and are negative.   PHYSICAL EXAM:   VS:  BP (!) 158/82   Pulse 76   Ht 5\' 9"  (1.753 m)   Wt 259 lb (117.5 kg)   BMI 38.25 kg/m    GEN: Well nourished, well developed, in no acute distress  HEENT: normal  Neck: no JVD, carotid bruits, or masses Cardiac: RRR; no murmurs, rubs, or gallops,no edema  Respiratory:  clear to auscultation bilaterally, normal work of breathing GI: soft, nontender, nondistended, + BS MS: no deformity or atrophy  Skin: warm and dry, no rash Neuro:  Alert and Oriented x 3, Strength and sensation are intact Psych: euthymic mood, full affect  Wt Readings from Last 3 Encounters:  07/01/17 259 lb (117.5 kg)  06/19/17 255 lb 6.4 oz (115.8 kg)  08/02/16 245 lb 8 oz (111.4 kg)      Studies/Labs Reviewed:   EKG:  EKG is not ordered today.    Recent Labs: 08/02/2016: ALT 14; TSH 0.24 06/18/2017: BUN 28; Creatinine, Ser 1.40; Hemoglobin 14.4; Platelets 192; Potassium 4.2; Sodium 139   Lipid Panel    Component  Value Date/Time   CHOL 146 08/02/2016 1028   TRIG 192 (H) 08/02/2016 1028   HDL 32 (L) 08/02/2016 1028   CHOLHDL 4.6 08/02/2016 1028   VLDL 38 (H) 08/02/2016 1028   LDLCALC 76 08/02/2016 1028    Additional studies/ records that were reviewed today include:   Myoview 03/26/14: Low risk stress nuclear study with diaphragmatic attenuation artifact.  Myoview 02/26/13: Low risk stress nuclear study with fixed inferoseptal bowel artifact and possible small area of inferobasal scar.. LV Wall Motion: NL LV Function; NL Wall Motion; EF 66%.  Heart cath 08/05/12: IMPRESSION: 1. Acute inferior ST-segment elevation myocardial infarction secondary to subtotal occlusion of the right coronary artery (nondominant). 2. Multivessel coronary artery disease at 40-50% narrowing in the proximal LAD, 20-30% narrowing in a OM2 branch of the dominant left circumflex coronary artery and subtotal occlusion stenosis of the right coronary artery with evidence for dissection beyond the initial lesion. 3. Successful percutaneous coronary intervention to the right coronary artery with ultimate insertion of a 2.25 x32 mm PROMUS DES stent post dilated at 2.38 mm tapering to 2.30 mm. 4. Angiomax/IC and IV nitroglycerin/180 mg oral Brilinta. 5. Total balloon time 29 minutes.   Echo 06/19/2017 LV EF: 55% -   60%  ------------------------------------------------------------------- History:   PMH:  CAD Native Vessel. Pulmonary Hypertension. Coronary artery disease.  Stroke.  Primary pulmonary hypertension. PMH:   Myocardial infarction.  Risk factors:  Current tobacco use. Hypertension. Diabetes mellitus.  ------------------------------------------------------------------- Study Conclusions  - Left ventricle: The cavity size was normal. Wall thickness was   increased in a pattern of mild LVH. Systolic function was normal.   The estimated ejection fraction was in the range of  55% to 60%.   Wall motion was normal; there were no regional wall motion   abnormalities. Features are consistent with a pseudonormal left   ventricular filling pattern, with concomitant abnormal relaxation   and increased filling pressure (grade  2 diastolic dysfunction). - Mitral valve: Calcified annulus.  Impressions:  - Normal LV systolic function; mild LVH; moderate diastolic   dysfunction; no TR to estimate pulmonary pressure.   ASSESSMENT:    1. Coronary artery disease involving native coronary artery of native heart without angina pectoris   2. Essential hypertension   3. Hyperlipidemia, unspecified hyperlipidemia type   4. Controlled type 2 diabetes mellitus without complication, without long-term current use of insulin ([place])   5. Tobacco abuse      PLAN:  In order of problems listed above:  1. CAD: Recently presented to the hospital with atypical chest pain, rule out by enzymes. Echocardiogram showed normal LV function. He is no longer having any chest discomfort, no further workup needed.  2. Hypertension: Blood pressure mildly elevated today. However he has not taken any blood pressure medication this morning, we'll hold off on adjusting his medication.  3. Hyperlipidemia: Continue Lipitor 40 mg daily. Will request fasting lipid panel and LFT from PCP's office  4. DM 2: We will defer management to primary care provider.  5. Tobacco abuse: Unfortunately he continued to smoke to this day, we have discussed the importance of tobacco cessation.    Medication Adjustments/Labs and Tests Ordered: Current medicines are reviewed at length with the patient today.  Concerns regarding medicines are outlined above.  Medication changes, Labs and Tests ordered today are listed in the Patient Instructions below. Patient Instructions  Medication Instructions:  Continue current medications  If you need a refill on your cardiac medications before your next appointment, please  call your pharmacy.  Labwork: None Ordered HERE IN OUR OFFICE AT [facility]  Testing/Procedures: None Ordered   Follow-Up: Your physician wants you to follow-up in: 6 Months with Dr Dow Adolph should receive a reminder letter in the mail two months in advance. If you do not receive a letter, please call our office [phone].    Thank you for choosing [facility] at [facility], [place], [place]  07/03/2017 6:22 AM    [address], [place], [place]  [address] Phone: [phone]; Fax: [phone]

## 2017-07-01 NOTE — Patient Instructions (Signed)
Medication Instructions:  Continue current medications  If you need a refill on your cardiac medications before your next appointment, please call your pharmacy.  Labwork: None Ordered HERE IN OUR OFFICE AT [facility]  Testing/Procedures: None Ordered   Follow-Up: Your physician wants you to follow-up in: 6 Months with Dr Dow Adolph should receive a reminder letter in the mail two months in advance. If you do not receive a letter, please call our office [phone].    Thank you for choosing [facility] at [facility]!!

## 2017-07-03 ENCOUNTER — Encounter: Payer: Self-pay | Admitting: Physician Assistant

## 2017-08-09 ENCOUNTER — Inpatient Hospital Stay (HOSPITAL_COMMUNITY): Payer: Medicare Other

## 2017-08-09 ENCOUNTER — Encounter (HOSPITAL_COMMUNITY): Payer: Self-pay | Admitting: Interventional Cardiology

## 2017-08-09 ENCOUNTER — Inpatient Hospital Stay (HOSPITAL_COMMUNITY)
Admission: EM | Admit: 2017-08-09 | Discharge: 2017-08-14 | DRG: 247 | Disposition: A | Discharge status: Home / Self Care | Payer: Medicare Other | Attending: Interventional Cardiology | Admitting: Interventional Cardiology

## 2017-08-09 ENCOUNTER — Emergency Department (HOSPITAL_COMMUNITY): Admission: EM | Admit: 2017-08-09 | Payer: Medicare Other

## 2017-08-09 ENCOUNTER — Encounter (HOSPITAL_COMMUNITY)
Admission: EM | Disposition: A | Discharge status: Home / Self Care | Payer: Self-pay | Source: Home / Self Care | Attending: Interventional Cardiology

## 2017-08-09 DIAGNOSIS — Z7982 Long term (current) use of aspirin: Secondary | ICD-10-CM | POA: Diagnosis not present

## 2017-08-09 DIAGNOSIS — I213 ST elevation (STEMI) myocardial infarction of unspecified site: Secondary | ICD-10-CM

## 2017-08-09 DIAGNOSIS — I2102 ST elevation (STEMI) myocardial infarction involving left anterior descending coronary artery: Principal | ICD-10-CM | POA: Diagnosis present

## 2017-08-09 DIAGNOSIS — E785 Hyperlipidemia, unspecified: Secondary | ICD-10-CM | POA: Diagnosis present

## 2017-08-09 DIAGNOSIS — Z800 Family history of malignant neoplasm of digestive organs: Secondary | ICD-10-CM

## 2017-08-09 DIAGNOSIS — E1122 Type 2 diabetes mellitus with diabetic chronic kidney disease: Secondary | ICD-10-CM | POA: Diagnosis present

## 2017-08-09 DIAGNOSIS — I25110 Atherosclerotic heart disease of native coronary artery with unstable angina pectoris: Secondary | ICD-10-CM | POA: Diagnosis not present

## 2017-08-09 DIAGNOSIS — Z8673 Personal history of transient ischemic attack (TIA), and cerebral infarction without residual deficits: Secondary | ICD-10-CM | POA: Diagnosis not present

## 2017-08-09 DIAGNOSIS — I252 Old myocardial infarction: Secondary | ICD-10-CM | POA: Diagnosis not present

## 2017-08-09 DIAGNOSIS — Z955 Presence of coronary angioplasty implant and graft: Secondary | ICD-10-CM | POA: Diagnosis not present

## 2017-08-09 DIAGNOSIS — E782 Mixed hyperlipidemia: Secondary | ICD-10-CM | POA: Diagnosis present

## 2017-08-09 DIAGNOSIS — Z885 Allergy status to narcotic agent status: Secondary | ICD-10-CM

## 2017-08-09 DIAGNOSIS — Z8249 Family history of ischemic heart disease and other diseases of the circulatory system: Secondary | ICD-10-CM | POA: Diagnosis not present

## 2017-08-09 DIAGNOSIS — G8929 Other chronic pain: Secondary | ICD-10-CM | POA: Diagnosis present

## 2017-08-09 DIAGNOSIS — M549 Dorsalgia, unspecified: Secondary | ICD-10-CM | POA: Diagnosis present

## 2017-08-09 DIAGNOSIS — I2510 Atherosclerotic heart disease of native coronary artery without angina pectoris: Secondary | ICD-10-CM | POA: Diagnosis present

## 2017-08-09 DIAGNOSIS — E119 Type 2 diabetes mellitus without complications: Secondary | ICD-10-CM

## 2017-08-09 DIAGNOSIS — R079 Chest pain, unspecified: Secondary | ICD-10-CM | POA: Diagnosis present

## 2017-08-09 DIAGNOSIS — J449 Chronic obstructive pulmonary disease, unspecified: Secondary | ICD-10-CM | POA: Diagnosis present

## 2017-08-09 DIAGNOSIS — I361 Nonrheumatic tricuspid (valve) insufficiency: Secondary | ICD-10-CM

## 2017-08-09 DIAGNOSIS — I10 Essential (primary) hypertension: Secondary | ICD-10-CM | POA: Diagnosis present

## 2017-08-09 DIAGNOSIS — Z7984 Long term (current) use of oral hypoglycemic drugs: Secondary | ICD-10-CM | POA: Diagnosis not present

## 2017-08-09 DIAGNOSIS — N183 Chronic kidney disease, stage 3 (moderate): Secondary | ICD-10-CM | POA: Diagnosis present

## 2017-08-09 DIAGNOSIS — I272 Pulmonary hypertension, unspecified: Secondary | ICD-10-CM | POA: Diagnosis present

## 2017-08-09 DIAGNOSIS — Z720 Tobacco use: Secondary | ICD-10-CM | POA: Diagnosis not present

## 2017-08-09 DIAGNOSIS — F17210 Nicotine dependence, cigarettes, uncomplicated: Secondary | ICD-10-CM | POA: Diagnosis present

## 2017-08-09 DIAGNOSIS — Z841 Family history of disorders of kidney and ureter: Secondary | ICD-10-CM

## 2017-08-09 DIAGNOSIS — I119 Hypertensive heart disease without heart failure: Secondary | ICD-10-CM | POA: Diagnosis present

## 2017-08-09 DIAGNOSIS — I4510 Unspecified right bundle-branch block: Secondary | ICD-10-CM | POA: Diagnosis present

## 2017-08-09 DIAGNOSIS — I130 Hypertensive heart and chronic kidney disease with heart failure and stage 1 through stage 4 chronic kidney disease, or unspecified chronic kidney disease: Secondary | ICD-10-CM | POA: Diagnosis present

## 2017-08-09 DIAGNOSIS — I2109 ST elevation (STEMI) myocardial infarction involving other coronary artery of anterior wall: Secondary | ICD-10-CM | POA: Diagnosis present

## 2017-08-09 HISTORY — PX: CORONARY/GRAFT ACUTE MI REVASCULARIZATION: CATH118305

## 2017-08-09 HISTORY — PX: LEFT HEART CATH AND CORONARY ANGIOGRAPHY: CATH118249

## 2017-08-09 LAB — LIPID PANEL
CHOL/HDL RATIO: 4.8 ratio
Cholesterol: 152 mg/dL (ref 0–200)
HDL: 32 mg/dL — AB (ref >40)
LDL CALC: 68 mg/dL (ref 0–99)
Triglycerides: 259 mg/dL — ABNORMAL HIGH (ref <150)
VLDL: 52 mg/dL — AB (ref 0–40)

## 2017-08-09 LAB — CBC
HCT: 44.6 % (ref 39.0–52.0)
HCT: 39.4 % (ref 39.0–52.0)
HEMOGLOBIN: 13.2 g/dL (ref 13.0–17.0)
Hemoglobin: 14.8 g/dL (ref 13.0–17.0)
MCH: 29.7 pg (ref 26.0–34.0)
MCH: 29.7 pg (ref 26.0–34.0)
MCHC: 33.2 g/dL (ref 30.0–36.0)
MCHC: 33.5 g/dL (ref 30.0–36.0)
MCV: 89.6 fL (ref 78.0–100.0)
MCV: 88.7 fL (ref 78.0–100.0)
PLATELETS: 198 10*3/uL (ref 150–400)
PLATELETS: 198 10*3/uL (ref 150–400)
RBC: 4.98 MIL/uL (ref 4.22–5.81)
RBC: 4.44 MIL/uL (ref 4.22–5.81)
RDW: 14.4 % (ref 11.5–15.5)
RDW: 14.4 % (ref 11.5–15.5)
WBC: 12.7 10*3/uL — AB (ref 4.0–10.5)
WBC: 11.5 10*3/uL — AB (ref 4.0–10.5)

## 2017-08-09 LAB — CREATININE, SERUM
Creatinine, Ser: 1.64 mg/dL — ABNORMAL HIGH (ref 0.61–1.24)
GFR calc Af Amer: 47 mL/min — ABNORMAL LOW (ref >60)
GFR calc non Af Amer: 40 mL/min — ABNORMAL LOW (ref >60)

## 2017-08-09 LAB — MRSA PCR SCREENING: MRSA by PCR: NEGATIVE

## 2017-08-09 LAB — BRAIN NATRIURETIC PEPTIDE: B NATRIURETIC PEPTIDE 5: 124.1 pg/mL — AB (ref 0.0–100.0)

## 2017-08-09 LAB — TROPONIN I: Troponin I: 19.96 ng/mL (ref <0.03)

## 2017-08-09 LAB — GLUCOSE, CAPILLARY: Glucose-Capillary: 188 mg/dL — ABNORMAL HIGH (ref 65–99)

## 2017-08-09 LAB — POCT ACTIVATED CLOTTING TIME
ACTIVATED CLOTTING TIME: 312 s
Activated Clotting Time: 268 seconds

## 2017-08-09 SURGERY — CORONARY/GRAFT ACUTE MI REVASCULARIZATION
Anesthesia: LOCAL

## 2017-08-09 MED ORDER — HEPARIN SODIUM (PORCINE) 1000 UNIT/ML IJ SOLN
INTRAMUSCULAR | Status: AC
  Filled 2017-08-09: qty 1

## 2017-08-09 MED ORDER — ALBUTEROL SULFATE (2.5 MG/3ML) 0.083% IN NEBU
2.5000 mg | INHALATION_SOLUTION | Freq: Four times a day (QID) | RESPIRATORY_TRACT | Status: DC | PRN
  Administered 2017-08-10 – 2017-08-13 (×4): 2.5 mg via RESPIRATORY_TRACT
  Filled 2017-08-09 (×4): qty 3

## 2017-08-09 MED ORDER — IOPAMIDOL (ISOVUE-370) INJECTION 76%
INTRAVENOUS | Status: AC
  Filled 2017-08-09: qty 100

## 2017-08-09 MED ORDER — COLCHICINE 0.6 MG PO TABS
0.6000 mg | ORAL_TABLET | Freq: Every day | ORAL | Status: DC
  Administered 2017-08-09 – 2017-08-14 (×6): 0.6 mg via ORAL
  Filled 2017-08-09 (×6): qty 1

## 2017-08-09 MED ORDER — SODIUM CHLORIDE 0.9% FLUSH
3.0000 mL | Freq: Two times a day (BID) | INTRAVENOUS | Status: DC
  Administered 2017-08-09 – 2017-08-13 (×7): 3 mL via INTRAVENOUS

## 2017-08-09 MED ORDER — FENTANYL CITRATE (PF) 100 MCG/2ML IJ SOLN: 25.0000 ug | INTRAMUSCULAR | Status: AC | PRN

## 2017-08-09 MED ORDER — SODIUM CHLORIDE 0.9 % IV SOLN: 250.0000 mL | INTRAVENOUS | Status: DC | PRN

## 2017-08-09 MED ORDER — HEPARIN SODIUM (PORCINE) 5000 UNIT/ML IJ SOLN
5000.0000 [IU] | Freq: Three times a day (TID) | INTRAMUSCULAR | Status: DC
  Administered 2017-08-09 – 2017-08-11 (×7): 5000 [IU] via SUBCUTANEOUS
  Filled 2017-08-09 (×7): qty 1

## 2017-08-09 MED ORDER — PERFLUTREN LIPID MICROSPHERE
INTRAVENOUS | Status: AC
  Administered 2017-08-09: 4 mL via INTRAVENOUS
  Filled 2017-08-09: qty 10

## 2017-08-09 MED ORDER — NITROGLYCERIN 1 MG/10 ML FOR IR/CATH LAB
INTRA_ARTERIAL | Status: DC | PRN
  Administered 2017-08-09 (×2): 200 ug via INTRACORONARY

## 2017-08-09 MED ORDER — IOPAMIDOL (ISOVUE-370) INJECTION 76%
INTRAVENOUS | Status: AC
  Filled 2017-08-09: qty 125

## 2017-08-09 MED ORDER — LIDOCAINE HCL (PF) 1 % IJ SOLN
INTRAMUSCULAR | Status: AC
  Filled 2017-08-09: qty 30

## 2017-08-09 MED ORDER — MIDAZOLAM HCL 2 MG/2ML IJ SOLN
INTRAMUSCULAR | Status: DC | PRN
  Administered 2017-08-09 (×2): 1 mg via INTRAVENOUS

## 2017-08-09 MED ORDER — ASPIRIN 81 MG PO CHEW
81.0000 mg | CHEWABLE_TABLET | Freq: Every day | ORAL | Status: DC
  Administered 2017-08-09 – 2017-08-14 (×6): 81 mg via ORAL
  Filled 2017-08-09 (×6): qty 1

## 2017-08-09 MED ORDER — FENTANYL CITRATE (PF) 100 MCG/2ML IJ SOLN
INTRAMUSCULAR | Status: DC | PRN
  Administered 2017-08-09 (×2): 50 ug via INTRAVENOUS

## 2017-08-09 MED ORDER — HEPARIN (PORCINE) IN NACL 2-0.9 UNIT/ML-% IJ SOLN
INTRAMUSCULAR | Status: AC
  Filled 2017-08-09: qty 1000

## 2017-08-09 MED ORDER — NITROGLYCERIN 1 MG/10 ML FOR IR/CATH LAB
INTRA_ARTERIAL | Status: AC
  Filled 2017-08-09: qty 10

## 2017-08-09 MED ORDER — NITROGLYCERIN IN D5W 200-5 MCG/ML-% IV SOLN
20.0000 ug/min | INTRAVENOUS | Status: DC
  Administered 2017-08-09: 5 ug/min via INTRAVENOUS
  Administered 2017-08-10: 10 ug/min via INTRAVENOUS
  Filled 2017-08-09: qty 250

## 2017-08-09 MED ORDER — IOPAMIDOL (ISOVUE-370) INJECTION 76%
INTRAVENOUS | Status: AC
  Filled 2017-08-09: qty 50

## 2017-08-09 MED ORDER — HEPARIN SODIUM (PORCINE) 1000 UNIT/ML IJ SOLN
INTRAMUSCULAR | Status: DC | PRN
  Administered 2017-08-09: 10000 [IU] via INTRAVENOUS
  Administered 2017-08-09: 3000 [IU] via INTRAVENOUS

## 2017-08-09 MED ORDER — AMLODIPINE BESYLATE 5 MG PO TABS
5.0000 mg | ORAL_TABLET | Freq: Every day | ORAL | Status: DC
Start: 2017-08-09 — End: 2017-08-14
  Administered 2017-08-09 – 2017-08-14 (×5): 5 mg via ORAL
  Filled 2017-08-09 (×6): qty 1

## 2017-08-09 MED ORDER — ONDANSETRON HCL 4 MG/2ML IJ SOLN: 4.0000 mg | Freq: Four times a day (QID) | INTRAMUSCULAR | Status: DC | PRN

## 2017-08-09 MED ORDER — HYDRALAZINE HCL 20 MG/ML IJ SOLN: 5.0000 mg | INTRAMUSCULAR | Status: AC | PRN

## 2017-08-09 MED ORDER — TICAGRELOR 90 MG PO TABS
ORAL_TABLET | ORAL | Status: DC | PRN
  Administered 2017-08-09: 90 mg via ORAL

## 2017-08-09 MED ORDER — VERAPAMIL HCL 2.5 MG/ML IV SOLN
INTRAVENOUS | Status: DC | PRN
  Administered 2017-08-09: 10 mL via INTRA_ARTERIAL

## 2017-08-09 MED ORDER — IOPAMIDOL (ISOVUE-370) INJECTION 76%
INTRAVENOUS | Status: DC | PRN
  Administered 2017-08-09: 225 mL via INTRA_ARTERIAL

## 2017-08-09 MED ORDER — METOPROLOL SUCCINATE ER 50 MG PO TB24
100.0000 mg | ORAL_TABLET | Freq: Every day | ORAL | Status: DC
  Administered 2017-08-09 – 2017-08-14 (×6): 100 mg via ORAL
  Filled 2017-08-09 (×6): qty 2

## 2017-08-09 MED ORDER — VERAPAMIL HCL 2.5 MG/ML IV SOLN
INTRAVENOUS | Status: AC
  Filled 2017-08-09: qty 2

## 2017-08-09 MED ORDER — ALLOPURINOL 300 MG PO TABS
300.0000 mg | ORAL_TABLET | Freq: Every day | ORAL | Status: DC
  Administered 2017-08-09 – 2017-08-14 (×6): 300 mg via ORAL
  Filled 2017-08-09 (×6): qty 1

## 2017-08-09 MED ORDER — ACETAMINOPHEN 325 MG PO TABS
650.0000 mg | ORAL_TABLET | ORAL | Status: DC | PRN
  Administered 2017-08-12: 650 mg via ORAL
  Filled 2017-08-09: qty 2

## 2017-08-09 MED ORDER — VITAMIN D 1000 UNITS PO TABS
2000.0000 [IU] | ORAL_TABLET | Freq: Every day | ORAL | Status: DC
  Administered 2017-08-09 – 2017-08-14 (×6): 2000 [IU] via ORAL
  Filled 2017-08-09 (×6): qty 2

## 2017-08-09 MED ORDER — TICAGRELOR 90 MG PO TABS
90.0000 mg | ORAL_TABLET | Freq: Two times a day (BID) | ORAL | Status: DC
  Administered 2017-08-09 – 2017-08-14 (×10): 90 mg via ORAL
  Filled 2017-08-09 (×10): qty 1

## 2017-08-09 MED ORDER — LABETALOL HCL 5 MG/ML IV SOLN: 10.0000 mg | INTRAVENOUS | Status: AC | PRN

## 2017-08-09 MED ORDER — TIOTROPIUM BROMIDE MONOHYDRATE 18 MCG IN CAPS
18.0000 ug | ORAL_CAPSULE | Freq: Every day | RESPIRATORY_TRACT | Status: DC
  Administered 2017-08-09 – 2017-08-14 (×3): 18 ug via RESPIRATORY_TRACT
  Filled 2017-08-09 (×2): qty 5

## 2017-08-09 MED ORDER — SODIUM CHLORIDE 0.9 % IV SOLN
INTRAVENOUS | Status: AC
  Administered 2017-08-09: 06:00:00 via INTRAVENOUS

## 2017-08-09 MED ORDER — BUPROPION HCL ER (XL) 150 MG PO TB24
300.0000 mg | ORAL_TABLET | Freq: Every day | ORAL | Status: DC
  Administered 2017-08-09 – 2017-08-14 (×6): 300 mg via ORAL
  Filled 2017-08-09 (×2): qty 1
  Filled 2017-08-09: qty 2
  Filled 2017-08-09: qty 1
  Filled 2017-08-09: qty 2
  Filled 2017-08-09: qty 1
  Filled 2017-08-09: qty 2

## 2017-08-09 MED ORDER — LIDOCAINE HCL (PF) 1 % IJ SOLN
INTRAMUSCULAR | Status: DC | PRN
  Administered 2017-08-09: 2 mL

## 2017-08-09 MED ORDER — HEPARIN (PORCINE) IN NACL 2-0.9 UNIT/ML-% IJ SOLN
INTRAMUSCULAR | Status: AC | PRN
  Administered 2017-08-09: 1000 mL

## 2017-08-09 MED ORDER — MIDAZOLAM HCL 2 MG/2ML IJ SOLN
INTRAMUSCULAR | Status: AC
  Filled 2017-08-09: qty 2

## 2017-08-09 MED ORDER — ATORVASTATIN CALCIUM 80 MG PO TABS
80.0000 mg | ORAL_TABLET | Freq: Every day | ORAL | Status: DC
  Administered 2017-08-09 – 2017-08-13 (×5): 80 mg via ORAL
  Filled 2017-08-09 (×5): qty 1

## 2017-08-09 MED ORDER — SERTRALINE HCL 50 MG PO TABS
50.0000 mg | ORAL_TABLET | Freq: Every day | ORAL | Status: DC
  Administered 2017-08-09 – 2017-08-14 (×6): 50 mg via ORAL
  Filled 2017-08-09 (×6): qty 1

## 2017-08-09 MED ORDER — FENTANYL CITRATE (PF) 100 MCG/2ML IJ SOLN
INTRAMUSCULAR | Status: AC
  Filled 2017-08-09: qty 2

## 2017-08-09 MED ORDER — SODIUM CHLORIDE 0.9% FLUSH: 3.0000 mL | INTRAVENOUS | Status: DC | PRN

## 2017-08-09 MED ORDER — NITROGLYCERIN 0.4 MG SL SUBL: 0.4000 mg | SUBLINGUAL_TABLET | SUBLINGUAL | Status: DC | PRN

## 2017-08-09 MED ORDER — TICAGRELOR 90 MG PO TABS
ORAL_TABLET | ORAL | Status: AC
  Filled 2017-08-09: qty 1

## 2017-08-09 SURGICAL SUPPLY — 19 items
BALLN SAPPHIRE 2.5X12 (BALLOONS) ×2
BALLN ~~LOC~~ EUPHORA RX 3.0X15 (BALLOONS) ×2
BALLOON SAPPHIRE 2.5X12 (BALLOONS) ×1 IMPLANT
BALLOON ~~LOC~~ EUPHORA RX 3.0X15 (BALLOONS) ×1 IMPLANT
CATH INFINITI JR4 5F (CATHETERS) ×2 IMPLANT
CATH VISTA GUIDE 6FR XBLAD3.5 (CATHETERS) ×2 IMPLANT
DEVICE RAD COMP TR BAND LRG (VASCULAR PRODUCTS) ×2 IMPLANT
ELECT DEFIB PAD ADLT CADENCE (PAD) ×2 IMPLANT
GLIDESHEATH SLEND A-KIT 6F 22G (SHEATH) ×2 IMPLANT
GLIDESHEATH SLEND SS 6F .021 (SHEATH) ×2 IMPLANT
GUIDEWIRE INQWIRE 1.5J.035X260 (WIRE) ×1 IMPLANT
INQWIRE 1.5J .035X260CM (WIRE) ×2
KIT ENCORE 26 ADVANTAGE (KITS) ×2 IMPLANT
KIT HEART LEFT (KITS) ×2 IMPLANT
PACK CARDIAC CATHETERIZATION (CUSTOM PROCEDURE TRAY) ×2 IMPLANT
STENT RESOLUTE ONYX 2.5X22 (Permanent Stent) ×2 IMPLANT
TRANSDUCER W/STOPCOCK (MISCELLANEOUS) ×2 IMPLANT
TUBING CIL FLEX 10 FLL-RA (TUBING) ×2 IMPLANT
WIRE ASAHI PROWATER 180CM (WIRE) ×2 IMPLANT

## 2017-08-09 NOTE — Progress Notes (Signed)
  Echocardiogram 2D Echocardiogram has been performed.  Jennette Dubin 08/09/2017, 3:25 PM

## 2017-08-09 NOTE — ED Provider Notes (Signed)
[facility] EMERGENCY DEPARTMENT Provider Note   CSN: [id number] Arrival date & time: 08/09/17  0358     History   Chief Complaint Chief Complaint  Patient presents with  . Code STEMI    HPI HARSHAAN [name] is a 71 y.o. male.  The history is provided by the patient.  He has a history of coronary artery disease status post stent placed in right coronary artery.  He was awakened at about 12:30 AM by midsternal chest pain without radiation.  There is associated dyspnea, nausea, diaphoresis.  He took nitroglycerin without relief.  He was brought in by ambulance as a code STEMI.  He states he has been compliant with his medications including Brilinta.  Past Medical History:  Diagnosis Date  . CAD (coronary artery disease),residual non obstructive disease    a. 08/2012 Inferior STEMI: RCA 99% (3.5x32 Promus DES), nonobs LAD/LCX dzs;  b. 01/2013 MV: EF 66%, small inferoseptal artifact and possible small inferobasilar scar;  c. 03/2014 MV: fixed inf defect->diaph attenuation, no ischemia.  . DM (diabetes mellitus), type 2 new diagnosis 08/05/2012   diet control  . ED (erectile dysfunction)    surgery planned  . History of echocardiogram    a. 08/2012 Echo: EF 55-60%, possible mild basal inferior HK, mildly dil RA, PASP 56mmHg.  [name] Hyperlipidemia 08/05/2012  . Hypertensive heart disease   . MI (myocardial infarction) ([place]) 08/04/2012   acute inferior stemi secondary to RCA occlusion; PCI  . Panic attack   . Pulmonary HTN ([place])    echo 08/05/12, EF 55-60%, PA pressure 28mm  . S/P coronary artery stent placement, to RCA Promus DES 08/05/2012  . Stroke [facility])    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  . Tobacco abuse     Patient Active Problem List   Diagnosis Date Noted  . Left arm pain 06/18/2017  . Hypertensive heart disease   . Erectile dysfunction 06/03/2014  . Shortness of breath on exertion 01/23/2013  . disease views combustion of polyvinyl chloride 1 01/23/2013  . PVC's  (premature ventricular contractions) 01/23/2013  . RBBB 08/07/2012  . COPD (chronic obstructive pulmonary disease) ([place]) 08/07/2012  . Chronic back pain, on disability 08/07/2012  . Obesity (BMI 30-39.9) 08/07/2012  . CAD ,residual 40-50% LAD 08/05/2012  . Hyperlipidemia 08/05/2012  . DM (diabetes mellitus), type 2 new diagnosis 08/05/2012  . STEMI: Inferior, s/p Promus DES to RCA 08/04/12-emergently 08/04/2012  . HTN (hypertension) 08/04/2012  . Tobacco abuse 08/04/2012  . History of CVA (cerebrovascular accident):  Apprx four years ago. 08/04/2012  . Panic attack, History of.. 08/04/2012    Past Surgical History:  Procedure Laterality Date  . CARDIAC CATHETERIZATION  08/04/12   PCI to RCA with DES  . LEFT HEART CATHETERIZATION WITH CORONARY ANGIOGRAM N/A 08/04/2012   Procedure: LEFT HEART CATHETERIZATION WITH CORONARY ANGIOGRAM;  Surgeon: Troy Sine, MD;  Location: [facility] CATH LAB;  Service: Cardiovascular;  Laterality: N/A;  . LEG SURGERY Left    rod placed for fracture repair  . PENILE PROSTHESIS IMPLANT N/A 06/03/2014   Procedure: IMPLANT PENILE PROTHESIS INFLATABLE;  Surgeon: Ailene Rud, MD;  Location: [facility] ORS;  Service: Urology;  Laterality: N/A;  with penile block--0.5% marcaine plain  . PERCUTANEOUS CORONARY STENT INTERVENTION (PCI-S) Right 08/04/2012   Procedure: PERCUTANEOUS CORONARY STENT INTERVENTION (PCI-S);  Surgeon: Troy Sine, MD;  Location: [facility] CATH LAB;  Service: Cardiovascular;  Laterality: Right;  . THUMB ARTHROSCOPY Left    thumb joint replaced  Home Medications    Prior to Admission medications   Medication Sig Start Date End Date Taking? Authorizing Provider  albuterol (PROVENTIL) (2.5 MG/3ML) 0.083% nebulizer solution Take 5 mg by nebulization every 4 (four) hours as needed. For wheezing or SOB    [provider]  allopurinol (ZYLOPRIM) 300 MG tablet Take 300 mg by mouth daily.    [provider]  amLODipine (NORVASC) 5 MG  tablet Take 5 mg by mouth daily. 08/17/16   [provider]  aspirin 81 MG tablet Take 81 mg by mouth daily.    [provider]  atorvastatin (LIPITOR) 40 MG tablet Take 40 mg by mouth daily. 03/21/17   [provider]  buPROPion (WELLBUTRIN XL) 300 MG 24 hr tablet Take 300 mg by mouth daily. 03/21/17   [provider]  cholecalciferol (VITAMIN D) 1000 UNITS tablet Take 2,000 Units by mouth daily.    [provider]  clonazePAM (KLONOPIN) 0.5 MG tablet Take 0.5 mg by mouth See admin instructions. TAKE 1 TABLET BY MOUTH TWO TIMES A DAY FOR 1 WEEK THEN TAKE 1 TABLET BY MOUTH EVERY DAY FOR 1 WEEK 06/01/17   [provider]  Coenzyme Q10 (COQ-10 PO) Take 1 capsule by mouth daily.    [provider]  COLCRYS 0.6 MG tablet Take 0.6 mg by mouth daily. 03/21/17   [provider]  hydrochlorothiazide (HYDRODIURIL) 25 MG tablet Take 25 mg by mouth daily. 03/21/17   [provider]  indomethacin (INDOCIN) 25 MG capsule Take 1 capsule (25 mg total) by mouth 3 (three) times daily as needed. 07/16/15   Dorie Rank, MD  lisinopril (PRINIVIL,ZESTRIL) 20 MG tablet Take 20 mg by mouth daily. 08/17/16   [provider]  metFORMIN (GLUCOPHAGE-XR) 500 MG 24 hr tablet Take 500 mg by mouth daily. 03/21/17   [provider]  metoprolol succinate (TOPROL-XL) 100 MG 24 hr tablet Take 100 mg by mouth daily. 08/17/16   [provider]  nitroGLYCERIN (NITROSTAT) 0.4 MG SL tablet Place 1 tablet (0.4 mg total) under the tongue every 5 (five) minutes x 3 doses as needed for chest pain. 01/05/14   Troy Sine, MD  Omega-3 Fatty Acids (FISH OIL) 1000 MG CPDR Take by mouth.    [provider]  oxyCODONE-acetaminophen (PERCOCET) 10-325 MG per tablet Take 1 tablet by mouth 3 (three) times daily.     [provider]  sertraline (ZOLOFT) 50 MG tablet Take 50 mg by mouth daily. 03/21/17   [provider]    tetrahydrozoline (VISINE) 0.05 % ophthalmic solution Place 1 drop into both eyes once as needed (dry/red eyes.).    [provider]  ticagrelor (BRILINTA) 90 MG TABS tablet Take 90 mg by mouth 2 (two) times daily. 08/17/16   [provider]  tiotropium (SPIRIVA) 18 MCG inhalation capsule Place 18 mcg into inhaler and inhale daily.    [provider]    Family History Family History  Problem Relation Age of Onset  . Kidney disease Father        kidney disease and heart disease  . Heart disease Maternal Grandmother   . Stomach cancer Maternal Grandfather   . Heart attack Paternal Grandmother     Social History Social History   Tobacco Use  . Smoking status: Current Every Day Smoker    Packs/day: 1.00    Years: 56.00    Pack years: 56.00    Types: Cigarettes  . Smokeless tobacco: Never  Used  . Tobacco comment: smoking now 1 ppd  Substance Use Topics  . Alcohol use: Yes    Comment: ocasionally  . Drug use: No     Allergies   Hydrocodone   Review of Systems Review of Systems  All other systems reviewed and are negative.    Physical Exam Updated Vital Signs Pulse (!) 42   Resp 17   SpO2 99%   Physical Exam  Nursing note and vitals reviewed.  71 year old male, resting comfortably and in no acute distress. Vital signs are normal. Oxygen saturation is 99%, which is normal. Head is normocephalic and atraumatic. PERRLA, EOMI. Oropharynx is clear. Neck is nontender and supple without adenopathy or JVD. Back is nontender and there is no CVA tenderness. Lungs are clear without rales, wheezes, or rhonchi. Chest is nontender. Heart has regular rate and rhythm without murmur. Abdomen is soft, flat, nontender without masses or hepatosplenomegaly and peristalsis is normoactive. Extremities have no cyanosis or edema, full range of motion is present. Skin is warm and dry without rash. Neurologic: Mental status is normal, cranial nerves are intact,  there are no motor or sensory deficits.  ED Treatments / Results   EKG  EKG Interpretation  Date/Time:  Friday August 09 2017 03:58:15 EST Ventricular Rate:  89 PR Interval:    QRS Duration: 145 QT Interval:  398 QTC Calculation: 468 R Axis:   -39 Text Interpretation:  Sinus rhythm Premature ventricular complexes in trigeminal pattern Right bundle branch block Probable lateral infarct, old Anterior infarct, acute (LAD) When compared with ECG of 06/18/2017, ** ** ACUTE MI / STEMI ** ** is now present Anterior leads Premature ventricular complexes are now present Confirmed by Delora Fuel ([id number]) on 08/09/2017 4:07:54 AM      Procedures Procedures (including critical care time) CRITICAL CARE Performed by: Delora Fuel Total critical care time: 35 minutes Critical care time was exclusive of separately billable procedures and treating other patients. Critical care was necessary to treat or prevent imminent or life-threatening deterioration. Critical care was time spent personally by me on the following activities: development of treatment plan with patient and/or surrogate as well as nursing, discussions with consultants, evaluation of patient's response to treatment, examination of patient, obtaining history from patient or surrogate, ordering and performing treatments and interventions, ordering and review of laboratory studies, ordering and review of radiographic studies, pulse oximetry and re-evaluation of patient's condition.  Medications Ordered in ED Medications - No data to display   Initial Impression / Assessment and Plan / ED Course  I have reviewed the triage vital signs and the nursing notes.  STEMI involving anterior wall.  Old records are reviewed, and he had been admitted for STEMI in 2013 involving right coronary artery.  At that time, there was 40-50% occlusion of LAD.  This is presumably the culprit lesion for recurrent STEMI.  Patient was seen with Dr. Percival Spanish and  Dr. Tamala Julian of cardiology service.  Patient is taken directly up to catheterization lab for emergent PCI.  Final Clinical Impressions(s) / ED Diagnoses   Final diagnoses:  ST elevation myocardial infarction (STEMI), unspecified artery [facility])    ED Discharge Orders    None       Delora Fuel, MD 47/09/62 [phone]

## 2017-08-09 NOTE — Progress Notes (Signed)
Progress Note  Patient Name: Bobby [name] Date of Encounter: 08/09/2017  Primary Cardiologist: Shelva Majestic MD  Subjective   Feels weak. No current chest pain or dyspnea.  Inpatient Medications    Scheduled Meds: . allopurinol  300 mg Oral Daily  . amLODipine  5 mg Oral Daily  . aspirin  81 mg Oral Daily  . atorvastatin  80 mg Oral q1800  . buPROPion  300 mg Oral Daily  . cholecalciferol  2,000 Units Oral Daily  . colchicine  0.6 mg Oral Daily  . heparin  5,000 Units Subcutaneous Q8H  . metoprolol succinate  100 mg Oral Daily  . sertraline  50 mg Oral Daily  . sodium chloride flush  3 mL Intravenous Q12H  . ticagrelor  90 mg Oral BID  . tiotropium  18 mcg Inhalation Daily   Continuous Infusions: . sodium chloride 75 mL/hr at 08/09/17 0530  . sodium chloride    . nitroGLYCERIN 7 mcg/min (08/09/17 0654)   PRN Meds: sodium chloride, acetaminophen, albuterol, fentaNYL (SUBLIMAZE) injection, hydrALAZINE, labetalol, nitroGLYCERIN, ondansetron (ZOFRAN) IV, sodium chloride flush   Vital Signs    Vitals:   08/09/17 0725 08/09/17 0730 08/09/17 0735 08/09/17 0740  BP: 133/75 113/80 119/66 115/63  Pulse:   88   Resp: 19 18 15  (!) 40  Temp:   (!) 97.4 F (36.3 C)   TempSrc:   Oral   SpO2: 92% 91% 94% 94%  Weight:      Height:        Intake/Output Summary (Last 24 hours) at 08/09/2017 0818 Last data filed at 08/09/2017 0700 Gross per 24 hour  Intake 113.21 ml  Output 250 ml  Net -136.79 ml   Filed Weights   08/09/17 0640  Weight: 260 lb (117.9 kg)    Telemetry    NSR with PVCs - Personally Reviewed  ECG    NSR with RBBB, some persistent ST elevation anteriorly. - Personally Reviewed  Physical Exam   GEN: No acute distress.   Neck: No JVD Cardiac: RRR, no murmurs, rubs, or gallops.  Respiratory: Clear to auscultation bilaterally. GI: Soft, nontender, non-distended  MS: No edema; No deformity. Radial band in place Neuro:  Nonfocal  Psych: Normal  affect   Labs    Chemistry Recent Labs  Lab 08/09/17 0613  CREATININE 1.64*  GFRNONAA 40*  GFRAA 47*     Hematology Recent Labs  Lab 08/09/17 0613  WBC 12.7*  RBC 4.98  HGB 14.8  HCT 44.6  MCV 89.6  MCH 29.7  MCHC 33.2  RDW 14.4  PLT 198    Cardiac Enzymes Recent Labs  Lab 08/09/17 0613  TROPONINI 19.96*   No results for input(s): TROPIPOC in the last 168 hours.   BNPNo results for input(s): BNP, PROBNP in the last 168 hours.   DDimer No results for input(s): DDIMER in the last 168 hours.   Radiology    No results found.  Cardiac Studies   Procedures   Coronary/Graft Acute MI Revascularization  LEFT HEART CATH AND CORONARY ANGIOGRAPHY  Conclusion    Acute anteroapical ST elevation myocardial infarction due to acute occlusion of the mid LAD at the second diagonal.  Diffusely diseased LAD with subtotal occlusion occurring in a longer segment of involvement  Left dominant coronary anatomy.  Widely patent left main coronary artery  Dominant circumflex with 99% mid vessel stenosis and 80% stenosis in a large first obtuse marginal branch  Widely patent nondominant right coronary previously  stented without evidence of restenosis or obstruction.  Successful culprit PCI of the LAD using a 2 5 x 22 mm Onyx DES postdilated to 3 mm in diameter with TIMI grade III flow.  The second diagonal was jailed but maintained TIMI grade III flow.  There is mild to moderate disease proximal and distal to the stent.  TIMI grade III flow was noted.    RECOMMENDATIONS:   Staged PCI of OM1 and mid circumflex on Monday -needs to be scheduled.  Close follow-up with kidney function is a patient has stage III chronic kidney disease and received greater than 200 cc of contrast during the procedure.  IV nitroglycerin  Continue Brilinta 90 mg p.o. twice daily  Aggressive lipid management.     Patient Profile     71 y.o. male with history of prior stent RCA presented  this am with acute anterior STEMI   Assessment & Plan    1. Anterior STEMI. S/p DES of mid LAD. Cycle troponin and Ecg. Check Echo. Currently pain free. On DAPT, metoprolol, amlodipine and high dose statin. He has residual severe disease in the mid LCx and first OM. Plan for staged PCI of these vessels on Monday if renal function remains stable. I will tentatively place on schedule but he will need orders on Sunday if creatinine OK.  2. DM  Metformin on hold. Now on SSI 3. HTN controlled. 4. CKD stage 3. Monitor closely post angiogram. 5. HLD on high dose statin.  6. History of CVA.  7. Tobacco abuse. Counseled on smoking cessation.   For questions or updates, please contact [place] Please consult [email or web address] for contact info under Cardiology/STEMI.      Signed, Peter Martinique, MD  08/09/2017, 8:18 AM

## 2017-08-09 NOTE — ED Notes (Signed)
Pt transported to cath lab by ED staff

## 2017-08-09 NOTE — ED Triage Notes (Addendum)
Ems pt from home, pt reports chest pain that woke him out of his sleep, was give 324 ASA and NTG x 1 PTA

## 2017-08-09 NOTE — H&P (Signed)
CARDIOLOGY ADMISSION NOTE  Patient ID: Bobby [name] MRN: [id number] DOB/AGE: 1946/03/01 71 y.o.  Admit date: 08/09/2017 Primary Physician   [name], MD Primary Cardiologist   Dr. Claiborne Billings Chief Complaint    Chest pain.    HPI:   The patient has a history of CAD as below.  Presents with chest pain starting about 90 minutes ago.  Anterior chest.  Severe.  Similar to previous MI.  EKG with acute anterior ST elevation.   He had been having some arm pain for the last month.  Was in the hospital in late October with more atypical symptoms and ruled out.   He denies any other acute illness recently.  Denies any GI bleeding.  No fevers no chills.  He actually is currently taking DAPT.     Past Medical History:  Diagnosis Date  . CAD (coronary artery disease),residual non obstructive disease    a. 08/2012 Inferior STEMI: RCA 99% (3.5x32 Promus DES), nonobs LAD/LCX dzs;  b. 01/2013 MV: EF 66%, small inferoseptal artifact and possible small inferobasilar scar;  c. 03/2014 MV: fixed inf defect->diaph attenuation, no ischemia.  . DM (diabetes mellitus), type 2 new diagnosis 08/05/2012   diet control  . ED (erectile dysfunction)    surgery planned  . History of echocardiogram    a. 08/2012 Echo: EF 55-60%, possible mild basal inferior HK, mildly dil RA, PASP 110mmHg.  [name] Hyperlipidemia 08/05/2012  . Hypertensive heart disease   . MI (myocardial infarction) ([place]) 08/04/2012   acute inferior stemi secondary to RCA occlusion; PCI  . Panic attack   . Pulmonary HTN ([place])    echo 08/05/12, EF 55-60%, PA pressure 53mm  . S/P coronary artery stent placement, to RCA Promus DES 08/05/2012  . Stroke [facility])    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  . Tobacco abuse     Past Surgical History:  Procedure Laterality Date  . CARDIAC CATHETERIZATION  08/04/12   PCI to RCA with DES  . LEFT HEART CATHETERIZATION WITH CORONARY ANGIOGRAM N/A 08/04/2012   Procedure: LEFT HEART CATHETERIZATION WITH CORONARY ANGIOGRAM;   Surgeon: Troy Sine, MD;  Location: [facility] CATH LAB;  Service: Cardiovascular;  Laterality: N/A;  . LEG SURGERY Left    rod placed for fracture repair  . PENILE PROSTHESIS IMPLANT N/A 06/03/2014   Procedure: IMPLANT PENILE PROTHESIS INFLATABLE;  Surgeon: Ailene Rud, MD;  Location: [facility] ORS;  Service: Urology;  Laterality: N/A;  with penile block--0.5% marcaine plain  . PERCUTANEOUS CORONARY STENT INTERVENTION (PCI-S) Right 08/04/2012   Procedure: PERCUTANEOUS CORONARY STENT INTERVENTION (PCI-S);  Surgeon: Troy Sine, MD;  Location: [facility] CATH LAB;  Service: Cardiovascular;  Laterality: Right;  . THUMB ARTHROSCOPY Left    thumb joint replaced    Allergies  Allergen Reactions  . Hydrocodone Itching   No current facility-administered medications on file prior to encounter.    Current Outpatient Medications on File Prior to Encounter  Medication Sig Dispense Refill  . albuterol (PROVENTIL) (2.5 MG/3ML) 0.083% nebulizer solution Take 5 mg by nebulization every 4 (four) hours as needed. For wheezing or SOB    . allopurinol (ZYLOPRIM) 300 MG tablet Take 300 mg by mouth daily.    [name] amLODipine (NORVASC) 5 MG tablet Take 5 mg by mouth daily.    [name] aspirin 81 MG tablet Take 81 mg by mouth daily.    [name] atorvastatin (LIPITOR) 40 MG tablet Take 40 mg by mouth daily.  3  . buPROPion (WELLBUTRIN XL) 300 MG 24  hr tablet Take 300 mg by mouth daily.  3  . cholecalciferol (VITAMIN D) 1000 UNITS tablet Take 2,000 Units by mouth daily.    . clonazePAM (KLONOPIN) 0.5 MG tablet Take 0.5 mg by mouth See admin instructions. TAKE 1 TABLET BY MOUTH TWO TIMES A DAY FOR 1 WEEK THEN TAKE 1 TABLET BY MOUTH EVERY DAY FOR 1 WEEK  0  . Coenzyme Q10 (COQ-10 PO) Take 1 capsule by mouth daily.    [name] COLCRYS 0.6 MG tablet Take 0.6 mg by mouth daily.  3  . hydrochlorothiazide (HYDRODIURIL) 25 MG tablet Take 25 mg by mouth daily.  3  . indomethacin (INDOCIN) 25 MG capsule Take 1 capsule (25 mg total) by mouth 3 (three)  times daily as needed. 21 capsule 0  . lisinopril (PRINIVIL,ZESTRIL) 20 MG tablet Take 20 mg by mouth daily.    . metFORMIN (GLUCOPHAGE-XR) 500 MG 24 hr tablet Take 500 mg by mouth daily.  3  . metoprolol succinate (TOPROL-XL) 100 MG 24 hr tablet Take 100 mg by mouth daily.    . nitroGLYCERIN (NITROSTAT) 0.4 MG SL tablet Place 1 tablet (0.4 mg total) under the tongue every 5 (five) minutes x 3 doses as needed for chest pain. 25 tablet 4  . Omega-3 Fatty Acids (FISH OIL) 1000 MG CPDR Take by mouth.    . oxyCODONE-acetaminophen (PERCOCET) 10-325 MG per tablet Take 1 tablet by mouth 3 (three) times daily.     . sertraline (ZOLOFT) 50 MG tablet Take 50 mg by mouth daily.  3  . tetrahydrozoline (VISINE) 0.05 % ophthalmic solution Place 1 drop into both eyes once as needed (dry/red eyes.).    [name] ticagrelor (BRILINTA) 90 MG TABS tablet Take 90 mg by mouth 2 (two) times daily.    [name] tiotropium (SPIRIVA) 18 MCG inhalation capsule Place 18 mcg into inhaler and inhale daily.     Social History   Socioeconomic History  . Marital status: Legally Separated    Spouse name: Not on file  . Number of children: Not on file  . Years of education: Not on file  . Highest education level: Not on file  Social Needs  . Financial resource strain: Not on file  . Food insecurity - worry: Not on file  . Food insecurity - inability: Not on file  . Transportation needs - medical: Not on file  . Transportation needs - non-medical: Not on file  Occupational History  . Not on file  Tobacco Use  . Smoking status: Current Every Day Smoker    Packs/day: 1.00    Years: 56.00    Pack years: 56.00    Types: Cigarettes  . Smokeless tobacco: Never Used  . Tobacco comment: smoking now 1 ppd  Substance and Sexual Activity  . Alcohol use: Yes    Comment: ocasionally  . Drug use: No  . Sexual activity: Not on file  Other Topics Concern  . Not on file  Social History Narrative  . Not on file    Family History  Problem  Relation Age of Onset  . Kidney disease Father        kidney disease and heart disease  . Heart disease Maternal Grandmother   . Stomach cancer Maternal Grandfather   . Heart attack Paternal Grandmother      ROS:  As stated in the HPI and negative for all other systems.  Physical Exam: Pulse (!) 42, resp. rate 17, SpO2 99 %.  GENERAL:  Well appearing  NECK:  No jugular venous distention, waveform within normal limits, carotid upstroke brisk and symmetric, no bruits, no thyromegaly LUNGS:  Clear to auscultation bilaterally BACK:  No CVA tenderness CHEST:  Unremarkable HEART:  PMI not displaced or sustained,S1 and S2 within normal limits, no S3, no S4, no clicks, no rubs, no murmurs ABD:  Flat, positive bowel sounds normal in frequency in pitch, no bruits, no rebound, no guarding, no midline pulsatile mass, no hepatomegaly, no splenomegaly EXT:  2 plus pulses throughout, no edema, no cyanosis no clubbing   Labs: Lab Results  Component Value Date   BUN 28 (H) 06/18/2017   Lab Results  Component Value Date   CREATININE 1.40 (H) 06/18/2017   Lab Results  Component Value Date   NA 139 06/18/2017   K 4.2 06/18/2017   CL 108 06/18/2017   CO2 24 06/18/2017   Lab Results  Component Value Date   TROPONINI <0.03 06/18/2017   Lab Results  Component Value Date   WBC 9.9 06/18/2017   HGB 14.4 06/18/2017   HCT 43.2 06/18/2017   MCV 88.7 06/18/2017   PLT 192 06/18/2017      Radiology:  NA  EKG:  NSR, RBBB, acute anterior ST elevation with PVCs  ASSESSMENT AND PLAN:    ACUTE ANTERIOR MI:  Patient taken directly to the cath lab.  Old cath films reviewed .  DM: Last A1c 6.7 10/18.  Continue current therapy.  CKD II:  Follow creat.      SignedMinus Breeding 08/09/2017, 4:02 AM

## 2017-08-09 NOTE — ED Triage Notes (Signed)
Pt coems

## 2017-08-10 LAB — COMPREHENSIVE METABOLIC PANEL
ALK PHOS: 80 U/L (ref 38–126)
ALT: 53 U/L (ref 17–63)
AST: 206 U/L — ABNORMAL HIGH (ref 15–41)
Albumin: 3.4 g/dL — ABNORMAL LOW (ref 3.5–5.0)
Anion gap: 9 (ref 5–15)
BUN: 20 mg/dL (ref 6–20)
CALCIUM: 9 mg/dL (ref 8.9–10.3)
CO2: 20 mmol/L — AB (ref 22–32)
CREATININE: 1.34 mg/dL — AB (ref 0.61–1.24)
Chloride: 107 mmol/L (ref 101–111)
GFR, EST AFRICAN AMERICAN: 60 mL/min — AB (ref >60)
GFR, EST NON AFRICAN AMERICAN: 52 mL/min — AB (ref >60)
Glucose, Bld: 127 mg/dL — ABNORMAL HIGH (ref 65–99)
Potassium: 3.9 mmol/L (ref 3.5–5.1)
Sodium: 136 mmol/L (ref 135–145)
Total Bilirubin: 0.5 mg/dL (ref 0.3–1.2)
Total Protein: 6.1 g/dL — ABNORMAL LOW (ref 6.5–8.1)

## 2017-08-10 LAB — CBC
HCT: 40.1 % (ref 39.0–52.0)
HEMOGLOBIN: 13.4 g/dL (ref 13.0–17.0)
MCH: 29.6 pg (ref 26.0–34.0)
MCHC: 33.4 g/dL (ref 30.0–36.0)
MCV: 88.7 fL (ref 78.0–100.0)
Platelets: 191 10*3/uL (ref 150–400)
RBC: 4.52 MIL/uL (ref 4.22–5.81)
RDW: 14.4 % (ref 11.5–15.5)
WBC: 11.9 10*3/uL — ABNORMAL HIGH (ref 4.0–10.5)

## 2017-08-10 LAB — ECHOCARDIOGRAM COMPLETE
Height: 69 in
Weight: 4160 oz

## 2017-08-10 LAB — TROPONIN I: Troponin I: >65 ng/mL (ref <0.03)

## 2017-08-10 NOTE — Progress Notes (Signed)
Progress Note  Patient Name: Bobby [name] Date of Encounter: 08/10/2017  Primary Cardiologist: Dr. Claiborne Billings  Subjective   Denies chest pain or shortness of breath.  His weakness has improved.  Inpatient Medications    Scheduled Meds: . allopurinol  300 mg Oral Daily  . amLODipine  5 mg Oral Daily  . aspirin  81 mg Oral Daily  . atorvastatin  80 mg Oral q1800  . buPROPion  300 mg Oral Daily  . cholecalciferol  2,000 Units Oral Daily  . colchicine  0.6 mg Oral Daily  . heparin  5,000 Units Subcutaneous Q8H  . metoprolol succinate  100 mg Oral Daily  . sertraline  50 mg Oral Daily  . sodium chloride flush  3 mL Intravenous Q12H  . ticagrelor  90 mg Oral BID  . tiotropium  18 mcg Inhalation Daily   Continuous Infusions: . sodium chloride    . nitroGLYCERIN 7 mcg/min (08/09/17 2200)   PRN Meds: sodium chloride, acetaminophen, albuterol, nitroGLYCERIN, ondansetron (ZOFRAN) IV, sodium chloride flush   Vital Signs    Vitals:   08/10/17 0600 08/10/17 0700 08/10/17 0800 08/10/17 0933  BP: 108/65 (!) 106/92 124/70 112/64  Pulse:    (!) 121  Resp: 19 18 18    Temp:   97.6 F (36.4 C)   TempSrc:   Oral   SpO2: 93% 91% 93%   Weight: 256 lb 9.9 oz (116.4 kg)     Height:        Intake/Output Summary (Last 24 hours) at 08/10/2017 1036 Last data filed at 08/10/2017 0936 Gross per 24 hour  Intake 2191.95 ml  Output 1375 ml  Net 816.95 ml   Filed Weights   08/09/17 0640 08/10/17 0600  Weight: 260 lb (117.9 kg) 256 lb 9.9 oz (116.4 kg)    Telemetry    Normal sinus rhythm- Personally Reviewed  ECG    Normal sinus rhythm, right bundle branch block- Personally Reviewed  Physical Exam    GEN: No acute distress.   Neck:  6 cm JVD Cardiac: RRR, no murmurs, rubs, or gallops.  Respiratory: Clear to auscultation bilaterally. GI: Soft, nontender, non-distended  MS: No edema; No deformity. Neuro:  Nonfocal  Psych: Normal affect   Labs    Chemistry Recent Labs  Lab  08/09/17 0613 08/10/17 0232  NA  --  136  K  --  3.9  CL  --  107  CO2  --  20*  GLUCOSE  --  127*  BUN  --  20  CREATININE 1.64* 1.34*  CALCIUM  --  9.0  PROT  --  6.1*  ALBUMIN  --  3.4*  AST  --  206*  ALT  --  53  ALKPHOS  --  80  BILITOT  --  0.5  GFRNONAA 40* 52*  GFRAA 47* 60*  ANIONGAP  --  9     Hematology Recent Labs  Lab 08/09/17 0613 08/09/17 1547 08/10/17 0232  WBC 12.7* 11.5* 11.9*  RBC 4.98 4.44 4.52  HGB 14.8 13.2 13.4  HCT 44.6 39.4 40.1  MCV 89.6 88.7 88.7  MCH 29.7 29.7 29.6  MCHC 33.2 33.5 33.4  RDW 14.4 14.4 14.4  PLT 198 198 191    Cardiac Enzymes Recent Labs  Lab 08/09/17 0613 08/09/17 1547 08/09/17 2200  TROPONINI 19.96* >65.00* >65.00*   No results for input(s): TROPIPOC in the last 168 hours.   BNP Recent Labs  Lab 08/09/17 1117  BNP 124.1*  DDimer No results for input(s): DDIMER in the last 168 hours.   Radiology    No results found.  Cardiac Studies   Left heart cath as previously noted  Patient Profile     71 y.o. male admitted with acute anterior STEMI  Assessment & Plan    1.  Anterior STEMI.  He is status post insertion of a drug-eluting stent to the mid LAD.  He was noted to have severe residual disease in the circumflex system.  He is pending staged PCI on Monday.  His kidney function remains stable. 2.  Diabetes -we will continue to hold metformin.  Follow sliding scale. 3.  Hypertension -his blood pressure is reasonably well controlled. 4.  Tobacco abuse -he is encouraged to remain smoke-free.  For questions or updates, please contact [place] Please consult [email or web address] for contact info under Cardiology/STEMI.      Signed, Bobby Peru, MD  08/10/2017, 10:36 AM  Patient ID: Bobby [name], male   DOB: 1946/07/22, 71 y.o.   MRN: [id number]

## 2017-08-10 NOTE — Plan of Care (Signed)
Pt able to turn self and ambulate in room/ transfer from chair to bed with minimal assistance. Discussed importance of smoking cessation. Noa cute events at this time. VS stable.

## 2017-08-10 NOTE — Progress Notes (Signed)
CARDIAC REHAB PHASE I   11:44am  Patient declined Cardiac Rehab. Stated he was too tired to ambulate. Encouraged patient to walk with nurse this PM. Pt agreeable.   [place], MS 08/10/2017 11:44 AM

## 2017-08-11 LAB — BASIC METABOLIC PANEL
Anion gap: 8 (ref 5–15)
BUN: 20 mg/dL (ref 6–20)
CHLORIDE: 106 mmol/L (ref 101–111)
CO2: 22 mmol/L (ref 22–32)
CREATININE: 1.52 mg/dL — AB (ref 0.61–1.24)
Calcium: 9.1 mg/dL (ref 8.9–10.3)
GFR calc non Af Amer: 44 mL/min — ABNORMAL LOW (ref >60)
GFR, EST AFRICAN AMERICAN: 51 mL/min — AB (ref >60)
Glucose, Bld: 128 mg/dL — ABNORMAL HIGH (ref 65–99)
POTASSIUM: 4.1 mmol/L (ref 3.5–5.1)
Sodium: 136 mmol/L (ref 135–145)

## 2017-08-11 MED ORDER — ASPIRIN 81 MG PO CHEW
81.0000 mg | CHEWABLE_TABLET | ORAL | Status: AC
  Administered 2017-08-12: 81 mg via ORAL

## 2017-08-11 MED ORDER — SODIUM CHLORIDE 0.9% FLUSH
3.0000 mL | Freq: Two times a day (BID) | INTRAVENOUS | Status: DC
  Administered 2017-08-11: 3 mL via INTRAVENOUS

## 2017-08-11 MED ORDER — SODIUM CHLORIDE 0.9 % WEIGHT BASED INFUSION
1.0000 mL/kg/h | INTRAVENOUS | Status: DC
  Administered 2017-08-11: 1 mL/kg/h via INTRAVENOUS

## 2017-08-11 MED ORDER — OXYCODONE HCL 5 MG PO TABS
10.0000 mg | ORAL_TABLET | Freq: Four times a day (QID) | ORAL | Status: DC | PRN
  Administered 2017-08-11 – 2017-08-12 (×4): 10 mg via ORAL
  Filled 2017-08-11 (×4): qty 2

## 2017-08-11 MED ORDER — SODIUM CHLORIDE 0.9% FLUSH: 3.0000 mL | INTRAVENOUS | Status: DC | PRN

## 2017-08-11 MED ORDER — SODIUM CHLORIDE 0.9 % IV SOLN: 250.0000 mL | INTRAVENOUS | Status: DC | PRN

## 2017-08-11 NOTE — Progress Notes (Signed)
Notified PA of chest pain, EKG and pt feeling better after interventions.

## 2017-08-11 NOTE — Progress Notes (Signed)
Progress Note  Patient Name: Bobby [name] Date of Encounter: 08/11/2017  Primary Cardiologist: Dr. Claiborne Billings Subjective   Denies chest pain or shortness of breath.  Inpatient Medications    Scheduled Meds: . allopurinol  300 mg Oral Daily  . amLODipine  5 mg Oral Daily  . aspirin  81 mg Oral Daily  . atorvastatin  80 mg Oral q1800  . buPROPion  300 mg Oral Daily  . cholecalciferol  2,000 Units Oral Daily  . colchicine  0.6 mg Oral Daily  . heparin  5,000 Units Subcutaneous Q8H  . metoprolol succinate  100 mg Oral Daily  . sertraline  50 mg Oral Daily  . sodium chloride flush  3 mL Intravenous Q12H  . ticagrelor  90 mg Oral BID  . tiotropium  18 mcg Inhalation Daily   Continuous Infusions: . sodium chloride Stopped (08/11/17 0625)  . nitroGLYCERIN Stopped (08/11/17 0625)   PRN Meds: sodium chloride, acetaminophen, albuterol, nitroGLYCERIN, ondansetron (ZOFRAN) IV, sodium chloride flush   Vital Signs    Vitals:   08/11/17 0600 08/11/17 0816 08/11/17 0835 08/11/17 0841  BP: 124/80  (!) 98/57 (!) 98/57  Pulse:   70 76  Resp: (!) 23  12 14   Temp:  98.3 F (36.8 C)    TempSrc:  Oral    SpO2: (!) 88%  (!) 88% 94%  Weight:      Height:        Intake/Output Summary (Last 24 hours) at 08/11/2017 1008 Last data filed at 08/11/2017 0600 Gross per 24 hour  Intake 889.12 ml  Output 1150 ml  Net -260.88 ml   Filed Weights   08/09/17 0640 08/10/17 0600  Weight: 260 lb (117.9 kg) 256 lb 9.9 oz (116.4 kg)    Telemetry    Normal sinus rhythm- Personally Reviewed  ECG    None- Personally Reviewed  Physical Exam   GEN: No acute distress.   Neck:  7 cm JVD Cardiac: RRR, no murmurs, rubs, or gallops.  Respiratory: Clear to auscultation bilaterally. GI: Soft, nontender, non-distended  MS: No edema; No deformity. Neuro:  Nonfocal  Psych: Normal affect   Labs    Chemistry Recent Labs  Lab 08/09/17 9735 08/10/17 0232 08/11/17 0303  NA  --  136 136  K  --   3.9 4.1  CL  --  107 106  CO2  --  20* 22  GLUCOSE  --  127* 128*  BUN  --  20 20  CREATININE 1.64* 1.34* 1.52*  CALCIUM  --  9.0 9.1  PROT  --  6.1*  --   ALBUMIN  --  3.4*  --   AST  --  206*  --   ALT  --  53  --   ALKPHOS  --  80  --   BILITOT  --  0.5  --   GFRNONAA 40* 52* 44*  GFRAA 47* 60* 51*  ANIONGAP  --  9 8     Hematology Recent Labs  Lab 08/09/17 0613 08/09/17 1547 08/10/17 0232  WBC 12.7* 11.5* 11.9*  RBC 4.98 4.44 4.52  HGB 14.8 13.2 13.4  HCT 44.6 39.4 40.1  MCV 89.6 88.7 88.7  MCH 29.7 29.7 29.6  MCHC 33.2 33.5 33.4  RDW 14.4 14.4 14.4  PLT 198 198 191    Cardiac Enzymes Recent Labs  Lab 08/09/17 0613 08/09/17 1547 08/09/17 2200  TROPONINI 19.96* >65.00* >65.00*   No results for input(s): TROPIPOC in the  last 168 hours.   BNP Recent Labs  Lab 08/09/17 1117  BNP 124.1*     DDimer No results for input(s): DDIMER in the last 168 hours.   Radiology    No results found.  Cardiac Studies   Left heart catheterization previously noted  Patient Profile     71 y.o. male admitted with acute anterior STEMI, with residual circumflex disease  Assessment & Plan    1.  Anterior STEMI -he is status post stenting of the mid LAD, and has severe residual circumflex disease.  He is pending staged PCI tomorrow 2.  Chronic stage III renal insufficiency -his creatinine is reasonably stable.  He will be gently hydrated prior to the procedure. 3.  Hypertension -his blood pressure is well controlled. 4.  Tobacco abuse -smoking cessation is encouraged.  For questions or updates, please contact [place] Please consult [email or web address] for contact info under Cardiology/STEMI.      Signed, Cristopher Peru, MD  08/11/2017, 10:08 AM  Patient ID: Bobby [name], male   DOB: 1946/06/25, 71 y.o.   MRN: [id number]

## 2017-08-11 NOTE — H&P (View-Only) (Signed)
Progress Note  Patient Name: MOSI HANNOLD Date of Encounter: 08/11/2017  Primary Cardiologist: Dr. Claiborne Billings Subjective   Denies chest pain or shortness of breath.  Inpatient Medications    Scheduled Meds: . allopurinol  300 mg Oral Daily  . amLODipine  5 mg Oral Daily  . aspirin  81 mg Oral Daily  . atorvastatin  80 mg Oral q1800  . buPROPion  300 mg Oral Daily  . cholecalciferol  2,000 Units Oral Daily  . colchicine  0.6 mg Oral Daily  . heparin  5,000 Units Subcutaneous Q8H  . metoprolol succinate  100 mg Oral Daily  . sertraline  50 mg Oral Daily  . sodium chloride flush  3 mL Intravenous Q12H  . ticagrelor  90 mg Oral BID  . tiotropium  18 mcg Inhalation Daily   Continuous Infusions: . sodium chloride Stopped (08/11/17 0625)  . nitroGLYCERIN Stopped (08/11/17 0625)   PRN Meds: sodium chloride, acetaminophen, albuterol, nitroGLYCERIN, ondansetron (ZOFRAN) IV, sodium chloride flush   Vital Signs    Vitals:   08/11/17 0600 08/11/17 0816 08/11/17 0835 08/11/17 0841  BP: 124/80  (!) 98/57 (!) 98/57  Pulse:   70 76  Resp: (!) 23  12 14   Temp:  98.3 F (36.8 C)    TempSrc:  Oral    SpO2: (!) 88%  (!) 88% 94%  Weight:      Height:        Intake/Output Summary (Last 24 hours) at 08/11/2017 1008 Last data filed at 08/11/2017 0600 Gross per 24 hour  Intake 889.12 ml  Output 1150 ml  Net -260.88 ml   Filed Weights   08/09/17 0640 08/10/17 0600  Weight: 260 lb (117.9 kg) 256 lb 9.9 oz (116.4 kg)    Telemetry    Normal sinus rhythm- Personally Reviewed  ECG    None- Personally Reviewed  Physical Exam   GEN: No acute distress.   Neck:  7 cm JVD Cardiac: RRR, no murmurs, rubs, or gallops.  Respiratory: Clear to auscultation bilaterally. GI: Soft, nontender, non-distended  MS: No edema; No deformity. Neuro:  Nonfocal  Psych: Normal affect   Labs    Chemistry Recent Labs  Lab 08/09/17 1324 08/10/17 0232 08/11/17 0303  NA  --  136 136  K  --   3.9 4.1  CL  --  107 106  CO2  --  20* 22  GLUCOSE  --  127* 128*  BUN  --  20 20  CREATININE 1.64* 1.34* 1.52*  CALCIUM  --  9.0 9.1  PROT  --  6.1*  --   ALBUMIN  --  3.4*  --   AST  --  206*  --   ALT  --  53  --   ALKPHOS  --  80  --   BILITOT  --  0.5  --   GFRNONAA 40* 52* 44*  GFRAA 47* 60* 51*  ANIONGAP  --  9 8     Hematology Recent Labs  Lab 08/09/17 0613 08/09/17 1547 08/10/17 0232  WBC 12.7* 11.5* 11.9*  RBC 4.98 4.44 4.52  HGB 14.8 13.2 13.4  HCT 44.6 39.4 40.1  MCV 89.6 88.7 88.7  MCH 29.7 29.7 29.6  MCHC 33.2 33.5 33.4  RDW 14.4 14.4 14.4  PLT 198 198 191    Cardiac Enzymes Recent Labs  Lab 08/09/17 0613 08/09/17 1547 08/09/17 2200  TROPONINI 19.96* >65.00* >65.00*   No results for input(s): TROPIPOC in the  last 168 hours.   BNP Recent Labs  Lab 08/09/17 1117  BNP 124.1*     DDimer No results for input(s): DDIMER in the last 168 hours.   Radiology    No results found.  Cardiac Studies   Left heart catheterization previously noted  Patient Profile     71 y.o. male admitted with acute anterior STEMI, with residual circumflex disease  Assessment & Plan    1.  Anterior STEMI -he is status post stenting of the mid LAD, and has severe residual circumflex disease.  He is pending staged PCI tomorrow 2.  Chronic stage III renal insufficiency -his creatinine is reasonably stable.  He will be gently hydrated prior to the procedure. 3.  Hypertension -his blood pressure is well controlled. 4.  Tobacco abuse -smoking cessation is encouraged.  For questions or updates, please contact [place] Please consult [email or web address] for contact info under Cardiology/STEMI.      Signed, Cristopher Peru, MD  08/11/2017, 10:08 AM  Patient ID: [name], male   DOB: 11/27/1945, 71 y.o.   MRN: [id number]

## 2017-08-11 NOTE — Progress Notes (Signed)
Found pt on room air, sat 86-88%.  RT placed pt on [place] 4 L, sat improved to 94%.

## 2017-08-11 NOTE — Progress Notes (Signed)
Patient c/o of left arm pain, general discomfort 5/10 some chest pian. Stat EKG done, NTG turned back on at 105mcg/min decreased pain, pt sleeping off and on.

## 2017-08-12 ENCOUNTER — Inpatient Hospital Stay (HOSPITAL_COMMUNITY)
Admission: EM | Disposition: A | Discharge status: Home / Self Care | Payer: Self-pay | Source: Home / Self Care | Attending: Interventional Cardiology

## 2017-08-12 ENCOUNTER — Ambulatory Visit (HOSPITAL_COMMUNITY): Admit: 2017-08-12 | Payer: Medicare Other | Admitting: Cardiology

## 2017-08-12 ENCOUNTER — Encounter (HOSPITAL_COMMUNITY): Payer: Self-pay | Admitting: Cardiology

## 2017-08-12 DIAGNOSIS — I25110 Atherosclerotic heart disease of native coronary artery with unstable angina pectoris: Secondary | ICD-10-CM

## 2017-08-12 DIAGNOSIS — N183 Chronic kidney disease, stage 3 (moderate): Secondary | ICD-10-CM

## 2017-08-12 DIAGNOSIS — Z720 Tobacco use: Secondary | ICD-10-CM

## 2017-08-12 DIAGNOSIS — E782 Mixed hyperlipidemia: Secondary | ICD-10-CM

## 2017-08-12 HISTORY — PX: CORONARY STENT INTERVENTION: CATH118234

## 2017-08-12 LAB — CBC
HEMATOCRIT: 36.2 % — AB (ref 39.0–52.0)
Hemoglobin: 11.8 g/dL — ABNORMAL LOW (ref 13.0–17.0)
MCH: 29.5 pg (ref 26.0–34.0)
MCHC: 32.6 g/dL (ref 30.0–36.0)
MCV: 90.5 fL (ref 78.0–100.0)
Platelets: 179 10*3/uL (ref 150–400)
RBC: 4 MIL/uL — ABNORMAL LOW (ref 4.22–5.81)
RDW: 14.6 % (ref 11.5–15.5)
WBC: 10.4 10*3/uL (ref 4.0–10.5)

## 2017-08-12 LAB — BASIC METABOLIC PANEL
ANION GAP: 7 (ref 5–15)
BUN: 19 mg/dL (ref 6–20)
CALCIUM: 9.2 mg/dL (ref 8.9–10.3)
CO2: 22 mmol/L (ref 22–32)
CREATININE: 1.33 mg/dL — AB (ref 0.61–1.24)
Chloride: 108 mmol/L (ref 101–111)
GFR, EST NON AFRICAN AMERICAN: 52 mL/min — AB (ref >60)
Glucose, Bld: 110 mg/dL — ABNORMAL HIGH (ref 65–99)
Potassium: 4.1 mmol/L (ref 3.5–5.1)
SODIUM: 137 mmol/L (ref 135–145)

## 2017-08-12 LAB — PROTIME-INR
INR: 0.96
Prothrombin Time: 12.7 seconds (ref 11.4–15.2)

## 2017-08-12 LAB — GLUCOSE, CAPILLARY
GLUCOSE-CAPILLARY: 106 mg/dL — AB (ref 65–99)
Glucose-Capillary: 155 mg/dL — ABNORMAL HIGH (ref 65–99)

## 2017-08-12 SURGERY — CORONARY STENT INTERVENTION
Anesthesia: LOCAL

## 2017-08-12 MED ORDER — MIDAZOLAM HCL 2 MG/2ML IJ SOLN
INTRAMUSCULAR | Status: AC
  Filled 2017-08-12: qty 2

## 2017-08-12 MED ORDER — FENTANYL CITRATE (PF) 100 MCG/2ML IJ SOLN
INTRAMUSCULAR | Status: AC
  Filled 2017-08-12: qty 2

## 2017-08-12 MED ORDER — SODIUM CHLORIDE 0.9 % IV SOLN: 250.0000 mL | INTRAVENOUS | Status: DC | PRN

## 2017-08-12 MED ORDER — IOPAMIDOL (ISOVUE-370) INJECTION 76%
INTRAVENOUS | Status: DC | PRN
  Administered 2017-08-12: 150 mL via INTRA_ARTERIAL

## 2017-08-12 MED ORDER — ASPIRIN 81 MG PO CHEW
CHEWABLE_TABLET | ORAL | Status: AC
  Filled 2017-08-12: qty 1

## 2017-08-12 MED ORDER — SODIUM CHLORIDE 0.9% FLUSH: 3.0000 mL | INTRAVENOUS | Status: DC | PRN

## 2017-08-12 MED ORDER — FENTANYL CITRATE (PF) 100 MCG/2ML IJ SOLN
INTRAMUSCULAR | Status: DC | PRN
  Administered 2017-08-12 (×3): 25 ug via INTRAVENOUS

## 2017-08-12 MED ORDER — VERAPAMIL HCL 2.5 MG/ML IV SOLN
INTRAVENOUS | Status: DC | PRN
  Administered 2017-08-12: 10 mL via INTRA_ARTERIAL

## 2017-08-12 MED ORDER — ENOXAPARIN SODIUM 40 MG/0.4ML ~~LOC~~ SOLN
40.0000 mg | SUBCUTANEOUS | Status: DC
  Administered 2017-08-13 – 2017-08-14 (×2): 40 mg via SUBCUTANEOUS
  Filled 2017-08-12 (×2): qty 0.4

## 2017-08-12 MED ORDER — LIDOCAINE HCL (PF) 1 % IJ SOLN
INTRAMUSCULAR | Status: AC
  Filled 2017-08-12: qty 30

## 2017-08-12 MED ORDER — NITROGLYCERIN 1 MG/10 ML FOR IR/CATH LAB
INTRA_ARTERIAL | Status: AC
  Filled 2017-08-12: qty 10

## 2017-08-12 MED ORDER — LIDOCAINE HCL (PF) 1 % IJ SOLN
INTRAMUSCULAR | Status: DC | PRN
  Administered 2017-08-12: 2 mL via INTRADERMAL

## 2017-08-12 MED ORDER — HEPARIN SODIUM (PORCINE) 1000 UNIT/ML IJ SOLN
INTRAMUSCULAR | Status: DC | PRN
  Administered 2017-08-12: 10000 [IU] via INTRAVENOUS

## 2017-08-12 MED ORDER — HEPARIN SODIUM (PORCINE) 1000 UNIT/ML IJ SOLN
INTRAMUSCULAR | Status: AC
  Filled 2017-08-12: qty 1

## 2017-08-12 MED ORDER — MIDAZOLAM HCL 2 MG/2ML IJ SOLN
INTRAMUSCULAR | Status: DC | PRN
  Administered 2017-08-12 (×3): 1 mg via INTRAVENOUS

## 2017-08-12 MED ORDER — SODIUM CHLORIDE 0.9% FLUSH
3.0000 mL | Freq: Two times a day (BID) | INTRAVENOUS | Status: DC
  Administered 2017-08-12 – 2017-08-14 (×4): 3 mL via INTRAVENOUS

## 2017-08-12 MED ORDER — SODIUM CHLORIDE 0.9 % WEIGHT BASED INFUSION: 1.0000 mL/kg/h | INTRAVENOUS | Status: AC

## 2017-08-12 MED ORDER — HEPARIN (PORCINE) IN NACL 2-0.9 UNIT/ML-% IJ SOLN
INTRAMUSCULAR | Status: AC
  Filled 2017-08-12: qty 1000

## 2017-08-12 MED ORDER — HEPARIN (PORCINE) IN NACL 2-0.9 UNIT/ML-% IJ SOLN
INTRAMUSCULAR | Status: AC | PRN
  Administered 2017-08-12: 1000 mL

## 2017-08-12 MED ORDER — VERAPAMIL HCL 2.5 MG/ML IV SOLN
INTRAVENOUS | Status: AC
  Filled 2017-08-12: qty 2

## 2017-08-12 SURGICAL SUPPLY — 20 items
BALLN SAPPHIRE 2.5X12 (BALLOONS) ×2
BALLN SAPPHIRE ~~LOC~~ 3.0X15 (BALLOONS) ×2 IMPLANT
BALLN ~~LOC~~ EMERGE MR 3.25X20 (BALLOONS) ×2
BALLOON SAPPHIRE 2.5X12 (BALLOONS) ×1 IMPLANT
BALLOON ~~LOC~~ EMERGE MR 3.25X20 (BALLOONS) ×1 IMPLANT
CATH VISTA GUIDE 6FR XBLAD3.5 (CATHETERS) ×2 IMPLANT
DEVICE RAD COMP TR BAND LRG (VASCULAR PRODUCTS) ×2 IMPLANT
ELECT DEFIB PAD ADLT CADENCE (PAD) ×2 IMPLANT
GLIDESHEATH SLEND SS 6F .021 (SHEATH) ×2 IMPLANT
GUIDEWIRE INQWIRE 1.5J.035X260 (WIRE) ×1 IMPLANT
HOVERMATT SINGLE USE (MISCELLANEOUS) ×2 IMPLANT
INQWIRE 1.5J .035X260CM (WIRE) ×2
KIT ENCORE 26 ADVANTAGE (KITS) ×2 IMPLANT
KIT HEART LEFT (KITS) ×2 IMPLANT
PACK CARDIAC CATHETERIZATION (CUSTOM PROCEDURE TRAY) ×2 IMPLANT
STENT RESOLUTE ONYX 3.0X18 (Permanent Stent) ×2 IMPLANT
STENT SIERRA 3.00 X 28 MM (Permanent Stent) ×2 IMPLANT
TRANSDUCER W/STOPCOCK (MISCELLANEOUS) ×2 IMPLANT
TUBING CIL FLEX 10 FLL-RA (TUBING) ×2 IMPLANT
WIRE MARVEL STR TIP 190CM (WIRE) ×2 IMPLANT

## 2017-08-12 NOTE — Progress Notes (Signed)
Progress Note  Patient Name: Bobby [name] Date of Encounter: 08/12/2017  Primary Cardiologist: Claiborne Billings  Subjective   Just back from cath lab; no chest pain  Inpatient Medications    Scheduled Meds: . allopurinol  300 mg Oral Daily  . amLODipine  5 mg Oral Daily  . aspirin  81 mg Oral Daily  . atorvastatin  80 mg Oral q1800  . buPROPion  300 mg Oral Daily  . cholecalciferol  2,000 Units Oral Daily  . colchicine  0.6 mg Oral Daily  . [START ON 08/13/2017] enoxaparin (LOVENOX) injection  40 mg Subcutaneous Q24H  . metoprolol succinate  100 mg Oral Daily  . sertraline  50 mg Oral Daily  . sodium chloride flush  3 mL Intravenous Q12H  . sodium chloride flush  3 mL Intravenous Q12H  . ticagrelor  90 mg Oral BID  . tiotropium  18 mcg Inhalation Daily   Continuous Infusions: . sodium chloride Stopped (08/11/17 0625)  . sodium chloride    . sodium chloride 1 mL/kg/hr (08/12/17 0924)   PRN Meds: sodium chloride, sodium chloride, acetaminophen, albuterol, nitroGLYCERIN, ondansetron (ZOFRAN) IV, oxyCODONE, sodium chloride flush, sodium chloride flush   Vital Signs    Vitals:   08/12/17 0831 08/12/17 0836 08/12/17 0905 08/12/17 0920  BP: 122/83 (!) 141/89 116/76 116/64  Pulse: 72 77 89 (!) 45  Resp: (!) 22 17 10  (!) 9  Temp:      TempSrc:      SpO2: 95% 97%  92%  Weight:      Height:        Intake/Output Summary (Last 24 hours) at 08/12/2017 1042 Last data filed at 08/12/2017 0700 Gross per 24 hour  Intake 1643 ml  Output 1645 ml  Net -2 ml    I/O since admission: +731  Filed Weights   08/09/17 0640 08/10/17 0600  Weight: 260 lb (117.9 kg) 256 lb 9.9 oz (116.4 kg)    Telemetry    Sinus rhythm  70- Personally Reviewed  ECG    ECG (independently read by me):  Physical Exam   BP 116/64   Pulse (!) 45   Temp 97.7 F (36.5 C) (Oral)   Resp (!) 9   Ht 5\' 9"  (1.753 m)   Wt 256 lb 9.9 oz (116.4 kg)   SpO2 92%   BMI 37.90 kg/m  General: Alert,  oriented, no distress.  Skin: normal turgor, no rashes, warm and dry HEENT: Normocephalic, atraumatic. Pupils equal round and reactive to light; sclera anicteric; extraocular muscles intact;  Nose without nasal septal hypertrophy Mouth/Parynx benign; Mallinpatti scale 3 Neck: Thick neck; No JVD, no carotid bruits; normal carotid upstroke Lungs: clear to ausculatation and percussion; no wheezing or rales Chest wall: without tenderness to palpitation Heart: PMI not displaced, RRR, s1 s2 normal, 1/6 systolic murmur, no diastolic murmur, no rubs, gallops, thrills, or heaves Abdomen: soft, nontender; no hepatosplenomehaly, BS+; abdominal aorta nontender and not dilated by palpation. Penile implant Back: no CVA tenderness Pulses 2+ R radial  Site TR band in place. Musculoskeletal: full range of motion, normal strength, no joint deformities Extremities: no clubbing cyanosis or edema, Homan's sign negative  Neurologic: grossly nonfocal; Cranial nerves grossly wnl Psychologic: Normal mood and affect   Labs    Chemistry Recent Labs  Lab 08/10/17 0232 08/11/17 0303 08/12/17 0233  NA 136 136 137  K 3.9 4.1 4.1  CL 107 106 108  CO2 20* 22 22  GLUCOSE 127* 128* 110*  BUN 20 20 19   CREATININE 1.34* 1.52* 1.33*  CALCIUM 9.0 9.1 9.2  PROT 6.1*  --   --   ALBUMIN 3.4*  --   --   AST 206*  --   --   ALT 53  --   --   ALKPHOS 80  --   --   BILITOT 0.5  --   --   GFRNONAA 52* 44* 52*  GFRAA 60* 51* >60  ANIONGAP 9 8 7      Hematology Recent Labs  Lab 08/09/17 0613 08/09/17 1547 08/10/17 0232  WBC 12.7* 11.5* 11.9*  RBC 4.98 4.44 4.52  HGB 14.8 13.2 13.4  HCT 44.6 39.4 40.1  MCV 89.6 88.7 88.7  MCH 29.7 29.7 29.6  MCHC 33.2 33.5 33.4  RDW 14.4 14.4 14.4  PLT 198 198 191    Cardiac Enzymes Recent Labs  Lab 08/09/17 0613 08/09/17 1547 08/09/17 2200  TROPONINI 19.96* >65.00* >65.00*   No results for input(s): TROPIPOC in the last 168 hours.   BNP Recent Labs  Lab  08/09/17 1117  BNP 124.1*     DDimer No results for input(s): DDIMER in the last 168 hours.   Lipid Panel     Component Value Date/Time   CHOL 152 08/09/2017 1117   TRIG 259 (H) 08/09/2017 1117   HDL 32 (L) 08/09/2017 1117   CHOLHDL 4.8 08/09/2017 1117   VLDL 52 (H) 08/09/2017 1117   LDLCALC 68 08/09/2017 1117    Radiology    No results found.  Cardiac Studies         Post-Intervention Diagram         Patient Profile     71 y.o. male with known CAD who presented on 08/09/2017 with Anterior STEMI.  Assessment & Plan    1. Anterior STEMI 08/09/17 Rx with LAD DES stent; s/p staged PCI/DES stent to OM and LCX today as above. No recurrent chest pain post procedure. Cines reviewed.   2. H/O  Inferior STEMI 08/2012; s/p PCI to RCA at that time; stent patent.   3. Tobacco abuse since age 57; again strongly discussed cessation.  4. DM: HbA1c 6.7 in 10/18; closely follow.  5. Mixed HLD: on atorvastatin 80; would add vascepa 2 capsule bid as outpatient.  6. Renal insufficiency; Stage 3; hydrate post  PCI; f/u labs in am; consider low dose ACE-I if stable    Signed, Troy Sine, MD, [facility] 08/12/2017, 10:42 AM

## 2017-08-12 NOTE — Progress Notes (Signed)
TR BAND REMOVAL  LOCATION:  right radial  DEFLATED PER PROTOCOL:  Yes.    TIME BAND OFF / DRESSING APPLIED:   1400   SITE UPON ARRIVAL:   Level 0  SITE AFTER BAND REMOVAL:  Level 1  CIRCULATION SENSATION AND MOVEMENT:  Within Normal Limits  Yes.    COMMENTS:

## 2017-08-12 NOTE — Interval H&P Note (Signed)
History and Physical Interval Note:  08/12/2017 7:21 AM  Bobby [name]  has presented today for surgery, with the diagnosis of cad  The various methods of treatment have been discussed with the patient and family. After consideration of risks, benefits and other options for treatment, the patient has consented to  Procedure(s): CORONARY STENT INTERVENTION (N/A) as a surgical intervention .  The patient's history has been reviewed, patient examined, no change in status, stable for surgery.  I have reviewed the patient's chart and labs.  Questions were answered to the patient's satisfaction.    Cath Lab Visit (complete for each Cath Lab visit)  Clinical Evaluation Leading to the Procedure:   ACS: Yes.    Non-ACS:    Anginal Classification: CCS IV  Anti-ischemic medical therapy: Maximal Therapy (2 or more classes of medications)  Non-Invasive Test Results: No non-invasive testing performed  Prior CABG: No previous CABG       Collier Salina [facility] 08/12/2017 7:22 AM

## 2017-08-13 LAB — BASIC METABOLIC PANEL
Anion gap: 11 (ref 5–15)
BUN: 16 mg/dL (ref 6–20)
CALCIUM: 9.5 mg/dL (ref 8.9–10.3)
CHLORIDE: 103 mmol/L (ref 101–111)
CO2: 21 mmol/L — AB (ref 22–32)
CREATININE: 1.2 mg/dL (ref 0.61–1.24)
GFR calc non Af Amer: 59 mL/min — ABNORMAL LOW (ref >60)
Glucose, Bld: 108 mg/dL — ABNORMAL HIGH (ref 65–99)
Potassium: 4.3 mmol/L (ref 3.5–5.1)
Sodium: 135 mmol/L (ref 135–145)

## 2017-08-13 LAB — POCT ACTIVATED CLOTTING TIME
ACTIVATED CLOTTING TIME: 428 s
Activated Clotting Time: 472 seconds

## 2017-08-13 LAB — CBC
HCT: 42.2 % (ref 39.0–52.0)
Hemoglobin: 14.3 g/dL (ref 13.0–17.0)
MCH: 30.1 pg (ref 26.0–34.0)
MCHC: 33.9 g/dL (ref 30.0–36.0)
MCV: 88.8 fL (ref 78.0–100.0)
Platelets: 150 10*3/uL (ref 150–400)
RBC: 4.75 MIL/uL (ref 4.22–5.81)
RDW: 14.3 % (ref 11.5–15.5)
WBC: 12.6 10*3/uL — ABNORMAL HIGH (ref 4.0–10.5)

## 2017-08-13 MED ORDER — OMEGA-3-ACID ETHYL ESTERS 1 G PO CAPS
2.0000 g | ORAL_CAPSULE | Freq: Two times a day (BID) | ORAL | Status: DC
  Administered 2017-08-13 – 2017-08-14 (×3): 2 g via ORAL
  Filled 2017-08-13 (×3): qty 2

## 2017-08-13 MED ORDER — LOSARTAN POTASSIUM 25 MG PO TABS
25.0000 mg | ORAL_TABLET | Freq: Every day | ORAL | Status: DC
  Administered 2017-08-14: 25 mg via ORAL
  Filled 2017-08-13 (×2): qty 1

## 2017-08-13 MED ORDER — LISINOPRIL 5 MG PO TABS: 5.0000 mg | ORAL_TABLET | Freq: Every day | ORAL | Status: DC

## 2017-08-13 NOTE — Progress Notes (Signed)
CARDIAC REHAB PHASE I   PRE:  Rate/Rhythm: 85 SR  BP:  Sitting: 127/82        SaO2: 92 RA  MODE:  Ambulation: 250 ft   POST:  Rate/Rhythm: 99 SR  BP:  Sitting: 122/62         SaO2: 95 RA  Pt ambulated 250 ft on RA, handheld assist, steady gait, tolerated fair.  Pt c/o DOE, denies cp, dizziness, standing rest x1, sats 94-95% on RA during ambulation. Completed MI/stent education with pt and daughter at bedside. Of note, pt does not read.  Reviewed risk factors, tobacco cessation (pt declined fake cigarette), MI book, anti-platelet therapy, stent card (unable to locate stent card from LAD stent), activity restrictions, ntg, exercise, heart healthy and diabetes diet, s/s chf, daily weights and phase 2 cardiac rehab. Pt verbalized understanding. Pt agrees to phase 2 cardiac rehab referral, will send to [facility]. Pt to recliner after walk, call bell within reach.   [phone] Lenna Sciara, RN, BSN 08/13/2017 9:41 AM

## 2017-08-13 NOTE — Discharge Summary (Signed)
Discharge Summary    Patient ID: Bobby [name],  MRN: [id number], DOB/AGE: 1946/08/29 71 y.o.  Admit date: 08/09/2017 Discharge date: 08/14/2017  Primary Care Provider: [name] Primary Cardiologist: Claiborne Billings   Discharge Diagnoses    Active Problems:   Acute ST elevation myocardial infarction (STEMI) involving left anterior descending (LAD) coronary artery (HCC)   HTN (hypertension)   Tobacco abuse   Hyperlipidemia   DM (diabetes mellitus), type 2 new diagnosis   COPD (chronic obstructive pulmonary disease) (HCC)   Hypertensive heart disease ICM  Allergies Allergies  Allergen Reactions  . Hydrocodone Itching    Diagnostic Studies/Procedures    Cath: 08/09/17  Conclusion    Acute anteroapical ST elevation myocardial infarction due to acute occlusion of the mid LAD at the second diagonal.  Diffusely diseased LAD with subtotal occlusion occurring in a longer segment of involvement  Left dominant coronary anatomy.  Widely patent left main coronary artery  Dominant circumflex with 99% mid vessel stenosis and 80% stenosis in a large first obtuse marginal branch  Widely patent nondominant right coronary previously stented without evidence of restenosis or obstruction.  Successful culprit PCI of the LAD using a 2 5 x 22 mm Onyx DES postdilated to 3 mm in diameter with TIMI grade III flow.  The second diagonal was jailed but maintained TIMI grade III flow.  There is mild to moderate disease proximal and distal to the stent.  TIMI grade III flow was noted.    RECOMMENDATIONS:   Staged PCI of OM1 and mid circumflex on Monday -needs to be scheduled.  Close follow-up with kidney function is a patient has stage III chronic kidney disease and received greater than 200 cc of contrast during the procedure.  IV nitroglycerin  Continue Brilinta 90 mg p.o. twice daily  Aggressive lipid management.   TTE: 08/09/17  Study Conclusions  - Left ventricle: The cavity  size was normal. There was moderate   concentric hypertrophy. Systolic function was moderately to   severely reduced. The estimated ejection fraction was in the   range of 30% to 35%. Doppler parameters are consistent with   abnormal left ventricular relaxation (grade 1 diastolic   dysfunction). Doppler parameters are consistent with elevated   ventricular end-diastolic filling pressure. - Aortic valve: Trileaflet; normal thickness leaflets. There was no   regurgitation. - Mitral valve: There was no regurgitation. - Right atrium: The atrium was normal in size. - Tricuspid valve: There was mild regurgitation. - Pulmonary arteries: Systolic pressure was within the normal   range.  Impressions:  - When compared to the prior study from 06/19/2017 there is a   significant diference, LVEF has dropped from 55-60% to 30-35%.   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   There is sludge (blood layering and heavy swirling) as a sign of   pre-thrombotic state, but no definite thrombus is seen in the   apex.  Cath: 08/12/17  Conclusion     Previously placed Prox LAD to Mid LAD stent (unknown type) is widely patent.  Mid Cx lesion is 99% stenosed.  A drug-eluting stent was successfully placed using a STENT SIERRA 3.00 X 28 MM.  Post intervention, there is a 0% residual stenosis.  Ost 1st Mrg to 1st Mrg lesion is 85% stenosed.  A drug-eluting stent was successfully placed using a STENT RESOLUTE ONYX 3.0X18.  Post intervention, there is a 0% residual stenosis.   1. Successful stenting of the mid LCx with a DES 2. Successful stenting of the first OM with DES  Plan: DAPT for at least one year. Hydrate. Anticipate DC in am if renal function is stable and no complication.   _____________   History of Present Illness     71 yo male with PMH of CAD, DM, HL, pulmonary HTN,  Stroke who presented with chest pain that started several hours prior to presenting to the ED. He was brought in via EMS with CODE STEMI called in the field. EKG showed acute anterior ST elevation and he was brought directly to the cath lab for emergent cardiac cath.   Hospital Course     He underwent cardiac cath with Dr. Tamala Julian noted above with diffusely diseased LAD with subtotal occlusion. Had successful PCI of the mLAD with TIMI grade III flow, though second diagonal was jailed with continued TIMI grade III flow. Plan for DAPT with ASA/Brilinta. He was continued on IV nitro with plans for staged intervention to the OM1 And Lcx. Post cath he did have a mild rise in Cr to 1.5 but improvement to 1.3 prior to staged intervention. 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 He underwent staged intervention with PCI/DES to the OM and Lcx. No recurrent chest pain. Cr was stable at 1.20 and Hgb 14.3 post cath. He was continued on high dose statin. Hgb A1c 6.7 back 10/18, he will be resumed on metformin 48 hours post cath. Given his low EF he was continued on BB with low dose ARB added.    General: Well developed, well nourished, male appearing in no acute distress. Head: Normocephalic, atraumatic.  Neck: Supple without bruits, JVD. Lungs:  Resp regular and unlabored, CTA. Heart: RRR, S1, S2, no S3, S4, or murmur; no rub. Abdomen: Soft, non-tender, non-distended with normoactive bowel sounds. No hepatomegaly. No rebound/guarding. No obvious abdominal masses. Extremities: No clubbing, cyanosis, edema. Distal pedal pulses are 2+ bilaterally. R radial cath site stable without bruising or hematoma Neuro: Alert and oriented X 3. Moves all extremities spontaneously. Psych: Normal affect.    Bobby [name] was seen by Dr. Claiborne Billings  and determined stable for discharge home. Follow up in the office has been arranged.  Medications are listed below.   _____________  Discharge Vitals Blood pressure 127/74, pulse 73, temperature (!) 97.4 F (36.3 C), temperature source Oral, resp. rate (!) 22, height 5\' 9"  (1.753 m), weight 255 lb 11.2 oz (116 kg), SpO2 98 %.  Filed Weights   08/09/17 0640 08/10/17 0600 08/14/17 0504  Weight: 260 lb (117.9 kg) 256 lb 9.9 oz (116.4 kg) 255 lb 11.2 oz (116 kg)    Labs & Radiologic Studies    CBC Recent Labs    08/12/17 0521 08/13/17 0417  WBC 10.4 12.6*  HGB 11.8* 14.3  HCT 36.2* 42.2  MCV 90.5 88.8  PLT 179 119   Basic Metabolic Panel Recent Labs    08/13/17 0417 08/14/17 0718  NA 135 135  K 4.3 3.9  CL 103 104  CO2 21* 24  GLUCOSE 108* 113*  BUN 16 13  CREATININE 1.20 1.18  CALCIUM 9.5 9.5   Liver Function Tests No results for input(s): AST, ALT, ALKPHOS, BILITOT, PROT, ALBUMIN in the last 72 hours. No results for input(s): LIPASE, AMYLASE in the last 72 hours.  Cardiac Enzymes No results for input(s): CKTOTAL, CKMB, CKMBINDEX, TROPONINI in the last 72 hours. BNP Invalid input(s): POCBNP D-Dimer No results for input(s): DDIMER in the last 72 hours. Hemoglobin A1C No results for input(s): HGBA1C in the last 72 hours. Fasting Lipid Panel No results for input(s): CHOL, HDL, LDLCALC, TRIG, CHOLHDL, LDLDIRECT in the last 72 hours. Thyroid Function Tests No results for input(s): TSH, T4TOTAL, T3FREE, THYROIDAB in the last 72 hours.  Invalid input(s): FREET3 _____________  No results found. Disposition   Pt is being discharged home today in good condition.  Follow-up Plans & Appointments    Follow-up Information    Troy Sine, MD Follow up on 08/13/2017.   Specialty:  Cardiology Why:  The office will call you with a follow up appt within the next 2 days. Please give [place] a call if you have not recieved a call within this time.  Contact information: [address] [place] [address] [phone]           Discharge Instructions    (HEART FAILURE PATIENTS) Call MD:  Anytime you have any of the following symptoms: 1) 3 pound weight gain in 24 hours or 5 pounds in 1 week 2) shortness of breath, with or without a dry hacking cough 3) swelling in the hands, feet or stomach 4) if you have to sleep on extra pillows at night in order to breathe.   Complete by:  As directed    Amb Referral to Cardiac Rehabilitation   Complete by:  As directed    Diagnosis:   Coronary Stents STEMI     Call MD for:  redness, tenderness, or signs of infection (pain, swelling, redness, odor or green/yellow discharge around incision site)   Complete by:  As directed    Diet - low sodium heart healthy   Complete by:  As directed    Discharge instructions   Complete by:  As directed    Radial Site Care Refer to this sheet in the next few weeks. These instructions provide you with information on caring for yourself after your procedure. Your caregiver may also give you more specific instructions. Your treatment has been planned according to current medical practices, but problems sometimes occur. Call your caregiver if you have any problems or questions after your procedure. HOME CARE INSTRUCTIONS You may shower the day after the procedure.Remove the bandage (dressing) and gently wash the site with plain soap and water.Gently pat the site dry.  Do not apply powder or lotion to the site.  Do not submerge the affected site in water for 3 to 5 days.  Inspect the site at least twice daily.  Do not flex or bend the affected arm for 24 hours.  No lifting over 5 pounds (2.3 kg) for 5 days after your procedure.  Do not drive home if you are discharged the same day of the procedure. Have someone else drive you.  You may drive 24 hours after the procedure unless otherwise instructed by your caregiver.  What to expect: Any bruising will usually fade within 1 to 2 weeks.  Blood that collects in the tissue (hematoma) may be  painful to the touch. It should usually decrease in size and tenderness within 1 to 2 weeks.  SEEK IMMEDIATE MEDICAL CARE IF: You have unusual pain at the radial site.  You have redness, warmth, swelling, or pain at the radial site.  You have drainage (other than a small amount of blood on the  dressing).  You have chills.  You have a fever or persistent symptoms for more than 72 hours.  You have a fever and your symptoms suddenly get worse.  Your arm becomes pale, cool, tingly, or numb.  You have heavy bleeding from the site. Hold pressure on the site.   PLEASE DO NOT MISS ANY DOSES OF YOUR BRILINTA!!!!! Also keep a log of you blood pressures and bring back to your follow up appt. Please call the office with any questions.   Patients taking blood thinners should generally stay away from medicines like ibuprofen, Advil, Motrin, naproxen, and Aleve due to risk of stomach bleeding. You may take Tylenol as directed or talk to your primary doctor about alternatives.  We switched your Lisinopril to Losartan this admission, and you will need to stop your Indomethacin. We also stopped your HCTZ.   For patients with congestive heart failure, we give them these special instructions:  1. Follow a low-salt diet and watch your fluid intake. In general, you should not be taking in more than 2 liters of fluid per day (no more than 8 glasses per day). Some patients are restricted to less than 1.5 liters of fluid per day (no more than 6 glasses per day). This includes sources of water in foods like soup, coffee, tea, milk, etc. 2. Weigh yourself on the same scale at same time of day and keep a log. 3. Call your doctor: (Anytime you feel any of the following symptoms)  - 3-4 pound weight gain in 1-2 days or 2 pounds overnight  - Shortness of breath, with or without a dry hacking cough  - Swelling in the hands, feet or stomach  - If you have to sleep on extra pillows at night in order to breathe   IT IS  IMPORTANT TO LET YOUR DOCTOR KNOW EARLY ON IF YOU ARE HAVING SYMPTOMS SO WE CAN HELP YOU!   Increase activity slowly   Complete by:  As directed       Discharge Medications     Medication List    STOP taking these medications   hydrochlorothiazide 25 MG tablet Commonly known as:  HYDRODIURIL   indomethacin 25 MG capsule Commonly known as:  INDOCIN   lisinopril 20 MG tablet Commonly known as:  PRINIVIL,ZESTRIL     TAKE these medications   albuterol (2.5 MG/3ML) 0.083% nebulizer solution Commonly known as:  PROVENTIL Take 5 mg by nebulization every 4 (four) hours as needed. For wheezing or SOB   allopurinol 300 MG tablet Commonly known as:  ZYLOPRIM Take 300 mg by mouth daily.   amLODipine 5 MG tablet Commonly known as:  NORVASC Take 5 mg by mouth daily.   aspirin 81 MG tablet Take 81 mg by mouth daily.   atorvastatin 80 MG tablet Commonly known as:  LIPITOR Take 1 tablet (80 mg total) by mouth daily at 6 PM. What changed:    medication strength  how much to take  when to take this   buPROPion 300 MG 24 hr tablet Commonly known as:  WELLBUTRIN XL Take 300 mg by mouth daily.   clonazePAM 0.5 MG tablet Commonly known as:  KLONOPIN Take 1 mg by mouth 2 (two) times daily.   COLCRYS 0.6 MG tablet Generic drug:  colchicine Take 0.6 mg by mouth daily.   losartan 25 MG tablet Commonly known as:  COZAAR Take 1 tablet (25 mg total) by mouth daily. Start taking on:  08/15/2017   metFORMIN 500  MG 24 hr tablet Commonly known as:  GLUCOPHAGE-XR Take 500 mg by mouth daily.   metoprolol succinate 100 MG 24 hr tablet Commonly known as:  TOPROL-XL Take 100 mg by mouth daily.   nitroGLYCERIN 0.4 MG SL tablet Commonly known as:  NITROSTAT Place 1 tablet (0.4 mg total) under the tongue every 5 (five) minutes x 3 doses as needed for chest pain.   omega-3 acid ethyl esters 1 g capsule Commonly known as:  LOVAZA Take 2 capsules (2 g total) by mouth 2 (two) times  daily.   oxyCODONE-acetaminophen 10-325 MG tablet Commonly known as:  PERCOCET Take 1 tablet by mouth 3 (three) times daily as needed for pain.   sertraline 50 MG tablet Commonly known as:  ZOLOFT Take 50 mg by mouth daily.   STIOLTO RESPIMAT 2.5-2.5 MCG/ACT Aers Generic drug:  Tiotropium Bromide-Olodaterol Inhale 2 puffs into the lungs daily.   ticagrelor 90 MG Tabs tablet Commonly known as:  BRILINTA Take 1 tablet (90 mg total) by mouth 2 (two) times daily.   tiotropium 18 MCG inhalation capsule Commonly known as:  SPIRIVA Place 18 mcg into inhaler and inhale daily.        Aspirin prescribed at discharge?  Yes High Intensity Statin Prescribed? (Lipitor 40-80mg  or Crestor 20-40mg ): Yes Beta Blocker Prescribed? Yes For EF <40%, was ACEI/ARB Prescribed? Yes ADP Receptor Inhibitor Prescribed? (i.e. Plavix etc.-Includes Medically Managed Patients): Yes For EF <40%, Aldosterone Inhibitor Prescribed? No: Consider at outpatient f/u if appropriate  Was EF assessed during THIS hospitalization? Yes Was Cardiac Rehab II ordered? (Included Medically managed Patients): Yes   Outstanding Labs/Studies   FLP/LFTs in 6 weeks if tolerating statin increase  Duration of Discharge Encounter   Greater than 30 minutes including physician time.  Signed, Reino Bellis NP-C 08/14/2017, 12:57 PM    Patient seen and examined. Agree with assessment and plan. No recurrent chest pain.  Breathing better today. Will dc indomethacin. With lung disease losartan rather than lisinopril was started. Continue DAPT indefinitely.  DC tobacco. For dc today with office f/u.   Troy Sine, MD, [facility] 08/14/2017 12:57 PM

## 2017-08-13 NOTE — Care Management Note (Addendum)
Case Management Note  Patient Details  Name: Bobby [name] MRN: [id number] Date of Birth: Oct 15, 1945  Subjective/Objective:   Anterior STEMI, 08/09/17 Rx with LAD DES stent                 Action/Plan: Discharge Planning: NCM spoke to pt and lives at home alone. He was on Brilinta prior to admission with a copay $1.25. He has a RW at home. Unit RN checked ambulating oxygen sat and oxygen is not needed for home. Pt has neb machine at home.   PCP [name] MD  # 6. S/W JAY @ [facility] # [phone]    BRILINTA 90 MG BID   COVER- YES  CO-PAY- ZERO DOLLARS  PRIOR APPROVAL- NO   PREFERRED PHARMACY : [facility]   PATIENT : HAD MEDICAID  ALSO  CO-PAY- $ 3.70 FOR EACH RX  EFF-DATE : 06-03-2017   Expected Discharge Date:                 Expected Discharge Plan:  Home/Self Care  In-House Referral:  NA  Discharge planning Services  CM Consult  Post Acute Care Choice:  NA Choice offered to:  NA  DME Arranged:  N/A DME Agency:  NA  HH Arranged:  NA HH Agency:  NA  Status of Service:  Completed, signed off  If discussed at [place] of Stay Meetings, dates discussed:    Additional Comments:  Erenest Rasher, RN 08/13/2017, 4:18 PM

## 2017-08-13 NOTE — Plan of Care (Signed)
  Progressing Cardiovascular: Ability to achieve and maintain adequate cardiovascular perfusion will improve 08/13/2017 0348 - Progressing by Colonel Bald, RN Vascular access site(s) Level 0-1 will be maintained 08/13/2017 0348 - Progressing by Colonel Bald, RN Note Right radial site = Level 1; slight bruising

## 2017-08-13 NOTE — Progress Notes (Signed)
Pt ambulated 250 ft. On RA. Tolerated well and had steady. 97% on RA. Will continue to monitor. Cato Mulligan.

## 2017-08-13 NOTE — Progress Notes (Signed)
Progress Note  Patient Name: Bobby [name] Date of Encounter: 08/13/2017  Primary Cardiologist: Claiborne Billings  Subjective   Feeling well this morning.   Inpatient Medications    Scheduled Meds: . allopurinol  300 mg Oral Daily  . amLODipine  5 mg Oral Daily  . aspirin  81 mg Oral Daily  . atorvastatin  80 mg Oral q1800  . buPROPion  300 mg Oral Daily  . cholecalciferol  2,000 Units Oral Daily  . colchicine  0.6 mg Oral Daily  . enoxaparin (LOVENOX) injection  40 mg Subcutaneous Q24H  . metoprolol succinate  100 mg Oral Daily  . sertraline  50 mg Oral Daily  . sodium chloride flush  3 mL Intravenous Q12H  . sodium chloride flush  3 mL Intravenous Q12H  . ticagrelor  90 mg Oral BID  . tiotropium  18 mcg Inhalation Daily   Continuous Infusions: . sodium chloride Stopped (08/11/17 0625)  . sodium chloride     PRN Meds: sodium chloride, sodium chloride, acetaminophen, albuterol, nitroGLYCERIN, ondansetron (ZOFRAN) IV, oxyCODONE, sodium chloride flush, sodium chloride flush   Vital Signs    Vitals:   08/12/17 1437 08/12/17 1645 08/12/17 1918 08/12/17 2349  BP:  124/69 103/88 138/80  Pulse:  68 77 80  Resp:   16 19  Temp:   98 F (36.7 C) 98.6 F (37 C)  TempSrc:   Axillary Axillary  SpO2: 95% 93% 93% 92%  Weight:      Height:        Intake/Output Summary (Last 24 hours) at 08/13/2017 0729 Last data filed at 08/13/2017 0600 Gross per 24 hour  Intake 1121.04 ml  Output 1825 ml  Net -703.96 ml   Filed Weights   08/09/17 0640 08/10/17 0600  Weight: 260 lb (117.9 kg) 256 lb 9.9 oz (116.4 kg)    Telemetry    SR - Personally Reviewed  ECG    SR with evolving ST changes in anterolateral leads - Personally Reviewed  Physical Exam   General: Well developed, well nourished, male appearing in no acute distress. Head: Normocephalic, atraumatic.  Neck: Supple without bruits, JVD. Lungs:  Resp regular and unlabored, CTA. Heart: RRR, S1, S2, no S3, S4, or murmur;  no rub. Abdomen: Soft, non-tender, non-distended with normoactive bowel sounds. No hepatomegaly. No rebound/guarding. No obvious abdominal masses. Extremities: No clubbing, cyanosis, edema. Distal pedal pulses are 2+ bilaterally. Right radial cath site stable.  Neuro: Alert and oriented X 3. Moves all extremities spontaneously. Psych: Normal affect.  Labs    Chemistry Recent Labs  Lab 08/10/17 0232 08/11/17 0303 08/12/17 0233 08/13/17 0417  NA 136 136 137 135  K 3.9 4.1 4.1 4.3  CL 107 106 108 103  CO2 20* 22 22 21*  GLUCOSE 127* 128* 110* 108*  BUN 20 20 19 16   CREATININE 1.34* 1.52* 1.33* 1.20  CALCIUM 9.0 9.1 9.2 9.5  PROT 6.1*  --   --   --   ALBUMIN 3.4*  --   --   --   AST 206*  --   --   --   ALT 53  --   --   --   ALKPHOS 80  --   --   --   BILITOT 0.5  --   --   --   GFRNONAA 52* 44* 52* 59*  GFRAA 60* 51* >60 >60  ANIONGAP 9 8 7 11      Hematology Recent Labs  Lab 08/10/17 [id number]  08/12/17 0521 08/13/17 0417  WBC 11.9* 10.4 12.6*  RBC 4.52 4.00* 4.75  HGB 13.4 11.8* 14.3  HCT 40.1 36.2* 42.2  MCV 88.7 90.5 88.8  MCH 29.6 29.5 30.1  MCHC 33.4 32.6 33.9  RDW 14.4 14.6 14.3  PLT 191 179 150    Cardiac Enzymes Recent Labs  Lab 08/09/17 0613 08/09/17 1547 08/09/17 2200  TROPONINI 19.96* >65.00* >65.00*   No results for input(s): TROPIPOC in the last 168 hours.   BNP Recent Labs  Lab 08/09/17 1117  BNP 124.1*     DDimer No results for input(s): DDIMER in the last 168 hours.    Radiology    No results found.  Cardiac Studies   Cath: 08/09/17  Conclusion    Acute anteroapical ST elevation myocardial infarction due to acute occlusion of the mid LAD at the second diagonal.  Diffusely diseased LAD with subtotal occlusion occurring in a longer segment of involvement  Left dominant coronary anatomy.  Widely patent left main coronary artery  Dominant circumflex with 99% mid vessel stenosis and 80% stenosis in a large first obtuse marginal  branch  Widely patent nondominant right coronary previously stented without evidence of restenosis or obstruction.  Successful culprit PCI of the LAD using a 2 5 x 22 mm Onyx DES postdilated to 3 mm in diameter with TIMI grade III flow.  The second diagonal was jailed but maintained TIMI grade III flow.  There is mild to moderate disease proximal and distal to the stent.  TIMI grade III flow was noted.    RECOMMENDATIONS:   Staged PCI of OM1 and mid circumflex on Monday -needs to be scheduled.  Close follow-up with kidney function is a patient has stage III chronic kidney disease and received greater than 200 cc of contrast during the procedure.  IV nitroglycerin  Continue Brilinta 90 mg p.o. twice daily  Aggressive lipid management.   TTE: 08/09/17  Study Conclusions  - Left ventricle: The cavity size was normal. There was moderate   concentric hypertrophy. Systolic function was moderately to   severely reduced. The estimated ejection fraction was in the   range of 30% to 35%. Doppler parameters are consistent with   abnormal left ventricular relaxation (grade 1 diastolic   dysfunction). Doppler parameters are consistent with elevated   ventricular end-diastolic filling pressure. - Aortic valve: Trileaflet; normal thickness leaflets. There was no   regurgitation. - Mitral valve: There was no regurgitation. - Right atrium: The atrium was normal in size. - Tricuspid valve: There was mild regurgitation. - Pulmonary arteries: Systolic pressure was within the normal   range.  Impressions:  - When compared to the prior study from 06/19/2017 there is a   significant diference, LVEF has dropped from 55-60% to 30-35%.   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   There is sludge (blood layering and heavy swirling) as a sign of   pre-thrombotic state, but no definite  thrombus is seen in the   apex.  Cath: 08/12/17  Conclusion     Previously placed Prox LAD to Mid LAD stent (unknown type) is widely patent.  Mid Cx lesion is 99% stenosed.  A drug-eluting stent was successfully placed using a STENT SIERRA 3.00 X 28 MM.  Post intervention, there is a 0% residual stenosis.  Ost 1st Mrg to 1st Mrg lesion is 85% stenosed.  A drug-eluting  stent was successfully placed using a STENT RESOLUTE ONYX 3.0X18.  Post intervention, there is a 0% residual stenosis.   1. Successful stenting of the mid LCx with a DES 2. Successful stenting of the first OM with DES  Plan: DAPT for at least one year. Hydrate. Anticipate DC in am if renal function is stable and no complication.     Patient Profile     71 y.o. male with known CAD who presented on 08/09/2017 with Anterior STEMI.  Assessment & Plan    1. Anterior STEMI: underwent cath on 12/7 with LAD DES, then staged intervention with PCI/DES to OM and Lcx 12/10. Plan for DAPT with ASA/Brilinta. On BB, ASA, statin  2. DM: Hgb A1c 6.7 10/18  3. HL: on high dose statin  4. Tobacco use: cessation advised  5. CKD: Cr peaked at 1.64, now stable this am.   6. ICM: Echo showed EF of 30-35%, BB will add low dose ACEi today as Cr is stable.    Signed, Reino Bellis, NP  08/13/2017, 7:29 AM  Pager # [phone]   For questions or updates, please contact [place] Please consult [email or web address] for contact info under Cardiology/STEMI.   Patient seen and examined. Agree with assessment and plan. Pt wanting to go home. Day 4 s/p Ant STEMI with PCI to LAD; day 1 s/p PCI to LCx an OM. Significant dyspnea with walking earlier today. O2 95%. HR in the 90s. Will re-assess potential DC this afternoon.  Add omega 3 FFA with TG > 600 previously.  Cr 1.22 today. Will add low dose losartan 25 mg and titrate as BP and renal function allow. Again discussed smoking cessation.    Troy Sine, MD,  [facility] 08/13/2017 8:51 AM

## 2017-08-14 ENCOUNTER — Encounter (HOSPITAL_COMMUNITY): Payer: Self-pay | Admitting: Cardiology

## 2017-08-14 DIAGNOSIS — E785 Hyperlipidemia, unspecified: Secondary | ICD-10-CM

## 2017-08-14 DIAGNOSIS — J449 Chronic obstructive pulmonary disease, unspecified: Secondary | ICD-10-CM

## 2017-08-14 LAB — BASIC METABOLIC PANEL
ANION GAP: 7 (ref 5–15)
BUN: 13 mg/dL (ref 6–20)
CO2: 24 mmol/L (ref 22–32)
Calcium: 9.5 mg/dL (ref 8.9–10.3)
Chloride: 104 mmol/L (ref 101–111)
Creatinine, Ser: 1.18 mg/dL (ref 0.61–1.24)
Glucose, Bld: 113 mg/dL — ABNORMAL HIGH (ref 65–99)
POTASSIUM: 3.9 mmol/L (ref 3.5–5.1)
SODIUM: 135 mmol/L (ref 135–145)

## 2017-08-14 MED ORDER — ATORVASTATIN CALCIUM 80 MG PO TABS: 80.0000 mg | ORAL_TABLET | Freq: Every day | ORAL | 1 refills | Status: DC

## 2017-08-14 MED ORDER — TICAGRELOR 90 MG PO TABS: 90.0000 mg | ORAL_TABLET | Freq: Two times a day (BID) | ORAL | 10 refills | Status: DC

## 2017-08-14 MED ORDER — LOSARTAN POTASSIUM 25 MG PO TABS: 25.0000 mg | ORAL_TABLET | Freq: Every day | ORAL | 2 refills | Status: DC

## 2017-08-14 MED ORDER — NITROGLYCERIN 0.4 MG SL SUBL
0.4000 mg | SUBLINGUAL_TABLET | SUBLINGUAL | 2 refills | Status: DC | PRN
Start: 2017-08-14 — End: 2018-10-01

## 2017-08-14 MED ORDER — OMEGA-3-ACID ETHYL ESTERS 1 G PO CAPS: 2.0000 g | ORAL_CAPSULE | Freq: Two times a day (BID) | ORAL | 1 refills | Status: DC

## 2017-08-14 MED FILL — Nitroglycerin IV Soln 100 MCG/ML in D5W: INTRA_ARTERIAL | Qty: 10 | Status: AC

## 2017-08-14 NOTE — Progress Notes (Signed)
Pt discharged to home. AVS reviewed with pt and pt's sister. PIVs removed. Pt to be transported home by sister. Pt left unit via wheelchair accompanied by NT, belongings in hand upon departure.

## 2017-08-14 NOTE — Care Management Important Message (Signed)
Important Message  Patient Details  Name: Bobby [name] MRN: [id number] Date of Birth: October 07, 1945   Medicare Important Message Given:  Yes    Dinah Lupa 08/14/2017, 9:04 AM

## 2017-08-14 NOTE — Progress Notes (Signed)
CARDIAC REHAB PHASE I   PRE:  Rate/Rhythm: 92 SR  BP:  Supine:   Sitting: 140/74  Standing:    SaO2: 95%RA  MODE:  Ambulation: 430 ft   POST:  Rate/Rhythm: 104 ST  BP:  Supine:   Sitting: 151/70  Standing:    SaO2: 96%RA [id number] Pt walked 430 ft on RA with steady gait. No CP. Slightly SOB but sats good. Think this is normal for pt. He is ready to go home.   Graylon Good, RN BSN  08/14/2017 9:39 AM

## 2017-08-16 ENCOUNTER — Telehealth: Payer: Self-pay | Admitting: Cardiovascular Disease

## 2017-08-16 DIAGNOSIS — I2583 Coronary atherosclerosis due to lipid rich plaque: Secondary | ICD-10-CM

## 2017-08-16 DIAGNOSIS — I2510 Atherosclerotic heart disease of native coronary artery without angina pectoris: Secondary | ICD-10-CM

## 2017-08-16 DIAGNOSIS — I10 Essential (primary) hypertension: Secondary | ICD-10-CM

## 2017-08-16 DIAGNOSIS — R0681 Apnea, not elsewhere classified: Secondary | ICD-10-CM

## 2017-08-16 DIAGNOSIS — R0683 Snoring: Secondary | ICD-10-CM

## 2017-08-16 DIAGNOSIS — E669 Obesity, unspecified: Secondary | ICD-10-CM

## 2017-08-16 DIAGNOSIS — I5023 Acute on chronic systolic (congestive) heart failure: Secondary | ICD-10-CM

## 2017-08-16 DIAGNOSIS — G4719 Other hypersomnia: Secondary | ICD-10-CM

## 2017-08-16 DIAGNOSIS — I2102 ST elevation (STEMI) myocardial infarction involving left anterior descending coronary artery: Secondary | ICD-10-CM

## 2017-08-16 NOTE — Telephone Encounter (Signed)
New Message     Toc 12/26 10a hao meng    Patient sister is also inquiring about setting up Cardiac therapy ? Can you set it up for home therapy because the patient gets really winded walking .

## 2017-08-16 NOTE — Telephone Encounter (Signed)
Patient contacted regarding discharge from [facility] on 08/14/17.    Patient understands to follow up with provider [name] PA on 12/26 at 10 AM at [facility] office.  Patient understands discharge instructions? yes  Patient understands medications and regiment? yes  Patient understands to bring all medications to this visit? yes    Spoke with sister (ok per DPR)-sister was wondering if for the first couple of weeks we could order [facility] cardiac rehab.   Advised I am unsure that they do cardiac rehab at home but more of physical therapy at home.   Sister also reports she thinks patient has sleep apnea.   States patient snores very loudly, falls asleep very quickly, has witnessed apnic events.  States patient had sleep study previously but they never were informed of the results.   Sleep study was in 2014-sister states he never received results and was never discussed further.   Sister would like to have this reevaluated.    Advised I would seek advice or answers in regards to home therapy as well if sleep study could be ordered or if this would need to be discussed at follow up.    Sister aware and verbalized understanding.

## 2017-08-18 NOTE — Telephone Encounter (Signed)
Agree to order sleep study.  I discussed this with the patient during his recent hospitalization.  Schedule patient for sleep study with me to read.

## 2017-08-19 ENCOUNTER — Telehealth: Payer: Self-pay | Admitting: Cardiovascular Disease

## 2017-08-19 NOTE — Telephone Encounter (Signed)
Spoke with pt dtr, the patient is going to see his medical doctor tomorrow. Aware to qualify oxygen he will need 02 sat testing that his PCP maybe willing to do for them tomorrow. Also aware cardiac rehab can not be done in the home and she will check with PCP about PT until able to get to rehab. She will address a lot of her questions with the PCP tomorrow and then let us know what they need help with. Also made aware the sleep study process will be started.

## 2017-08-19 NOTE — Telephone Encounter (Signed)
Unable to reach pt or leave a message  

## 2017-08-19 NOTE — Telephone Encounter (Signed)
New Message  Ms Bobby [name] call requesting to speak with RN about receiving a call from Cardiac rehab for pt. She would also like to discuss pt getting a breathing machine for oxygen. She states pt does a lot better with oxygen. Please call back to discuss

## 2017-08-20 ENCOUNTER — Observation Stay (HOSPITAL_COMMUNITY)
Admission: EM | Admit: 2017-08-20 | Discharge: 2017-08-21 | Disposition: A | Discharge status: Home / Self Care | Payer: Medicare Other | Attending: Cardiology | Admitting: Cardiology

## 2017-08-20 ENCOUNTER — Emergency Department (HOSPITAL_COMMUNITY): Payer: Medicare Other

## 2017-08-20 ENCOUNTER — Encounter (HOSPITAL_COMMUNITY): Payer: Self-pay

## 2017-08-20 DIAGNOSIS — Z8673 Personal history of transient ischemic attack (TIA), and cerebral infarction without residual deficits: Secondary | ICD-10-CM | POA: Diagnosis not present

## 2017-08-20 DIAGNOSIS — R001 Bradycardia, unspecified: Secondary | ICD-10-CM | POA: Diagnosis not present

## 2017-08-20 DIAGNOSIS — E785 Hyperlipidemia, unspecified: Secondary | ICD-10-CM | POA: Diagnosis not present

## 2017-08-20 DIAGNOSIS — Z79891 Long term (current) use of opiate analgesic: Secondary | ICD-10-CM | POA: Insufficient documentation

## 2017-08-20 DIAGNOSIS — I255 Ischemic cardiomyopathy: Secondary | ICD-10-CM | POA: Diagnosis not present

## 2017-08-20 DIAGNOSIS — I2510 Atherosclerotic heart disease of native coronary artery without angina pectoris: Secondary | ICD-10-CM | POA: Diagnosis not present

## 2017-08-20 DIAGNOSIS — Z79899 Other long term (current) drug therapy: Secondary | ICD-10-CM | POA: Insufficient documentation

## 2017-08-20 DIAGNOSIS — E669 Obesity, unspecified: Secondary | ICD-10-CM | POA: Diagnosis not present

## 2017-08-20 DIAGNOSIS — E119 Type 2 diabetes mellitus without complications: Secondary | ICD-10-CM | POA: Diagnosis not present

## 2017-08-20 DIAGNOSIS — I110 Hypertensive heart disease with heart failure: Secondary | ICD-10-CM | POA: Diagnosis not present

## 2017-08-20 DIAGNOSIS — I5021 Acute systolic (congestive) heart failure: Secondary | ICD-10-CM | POA: Diagnosis not present

## 2017-08-20 DIAGNOSIS — J441 Chronic obstructive pulmonary disease with (acute) exacerbation: Secondary | ICD-10-CM | POA: Diagnosis not present

## 2017-08-20 DIAGNOSIS — I5023 Acute on chronic systolic (congestive) heart failure: Secondary | ICD-10-CM | POA: Diagnosis present

## 2017-08-20 DIAGNOSIS — Z885 Allergy status to narcotic agent status: Secondary | ICD-10-CM | POA: Insufficient documentation

## 2017-08-20 DIAGNOSIS — Z7982 Long term (current) use of aspirin: Secondary | ICD-10-CM | POA: Insufficient documentation

## 2017-08-20 DIAGNOSIS — G4733 Obstructive sleep apnea (adult) (pediatric): Secondary | ICD-10-CM | POA: Insufficient documentation

## 2017-08-20 DIAGNOSIS — G8929 Other chronic pain: Secondary | ICD-10-CM | POA: Diagnosis not present

## 2017-08-20 DIAGNOSIS — F17210 Nicotine dependence, cigarettes, uncomplicated: Secondary | ICD-10-CM | POA: Diagnosis not present

## 2017-08-20 DIAGNOSIS — J449 Chronic obstructive pulmonary disease, unspecified: Secondary | ICD-10-CM

## 2017-08-20 DIAGNOSIS — Z7984 Long term (current) use of oral hypoglycemic drugs: Secondary | ICD-10-CM | POA: Diagnosis not present

## 2017-08-20 DIAGNOSIS — N179 Acute kidney failure, unspecified: Secondary | ICD-10-CM | POA: Diagnosis not present

## 2017-08-20 DIAGNOSIS — I5022 Chronic systolic (congestive) heart failure: Secondary | ICD-10-CM | POA: Insufficient documentation

## 2017-08-20 DIAGNOSIS — I252 Old myocardial infarction: Secondary | ICD-10-CM | POA: Diagnosis not present

## 2017-08-20 DIAGNOSIS — I272 Pulmonary hypertension, unspecified: Secondary | ICD-10-CM | POA: Diagnosis not present

## 2017-08-20 LAB — COMPREHENSIVE METABOLIC PANEL
ALBUMIN: 3.7 g/dL (ref 3.5–5.0)
ALT: 44 U/L (ref 17–63)
AST: 26 U/L (ref 15–41)
Alkaline Phosphatase: 106 U/L (ref 38–126)
Anion gap: 8 (ref 5–15)
BUN: 26 mg/dL — AB (ref 6–20)
CHLORIDE: 104 mmol/L (ref 101–111)
CO2: 20 mmol/L — ABNORMAL LOW (ref 22–32)
CREATININE: 1.58 mg/dL — AB (ref 0.61–1.24)
Calcium: 9.4 mg/dL (ref 8.9–10.3)
GFR calc Af Amer: 49 mL/min — ABNORMAL LOW (ref >60)
GFR, EST NON AFRICAN AMERICAN: 42 mL/min — AB (ref >60)
GLUCOSE: 115 mg/dL — AB (ref 65–99)
POTASSIUM: 4.9 mmol/L (ref 3.5–5.1)
Sodium: 132 mmol/L — ABNORMAL LOW (ref 135–145)
Total Bilirubin: 0.4 mg/dL (ref 0.3–1.2)
Total Protein: 6.9 g/dL (ref 6.5–8.1)

## 2017-08-20 LAB — CBC WITH DIFFERENTIAL/PLATELET
BASOS ABS: 0.1 10*3/uL (ref 0.0–0.1)
BASOS PCT: 1 %
EOS ABS: 0.5 10*3/uL (ref 0.0–0.7)
EOS PCT: 6 %
HCT: 41.4 % (ref 39.0–52.0)
Hemoglobin: 13.7 g/dL (ref 13.0–17.0)
Lymphocytes Relative: 31 %
Lymphs Abs: 2.7 10*3/uL (ref 0.7–4.0)
MCH: 29.8 pg (ref 26.0–34.0)
MCHC: 33.1 g/dL (ref 30.0–36.0)
MCV: 90 fL (ref 78.0–100.0)
MONO ABS: 0.4 10*3/uL (ref 0.1–1.0)
Monocytes Relative: 5 %
Neutro Abs: 4.9 10*3/uL (ref 1.7–7.7)
Neutrophils Relative %: 57 %
PLATELETS: 245 10*3/uL (ref 150–400)
RBC: 4.6 MIL/uL (ref 4.22–5.81)
RDW: 14.8 % (ref 11.5–15.5)
WBC: 8.5 10*3/uL (ref 4.0–10.5)

## 2017-08-20 LAB — CBC
HCT: 43.6 % (ref 39.0–52.0)
HEMOGLOBIN: 14.5 g/dL (ref 13.0–17.0)
MCH: 29.8 pg (ref 26.0–34.0)
MCHC: 33.3 g/dL (ref 30.0–36.0)
MCV: 89.7 fL (ref 78.0–100.0)
PLATELETS: 251 10*3/uL (ref 150–400)
RBC: 4.86 MIL/uL (ref 4.22–5.81)
RDW: 14.6 % (ref 11.5–15.5)
WBC: 8.8 10*3/uL (ref 4.0–10.5)

## 2017-08-20 LAB — MAGNESIUM: MAGNESIUM: 2 mg/dL (ref 1.7–2.4)

## 2017-08-20 LAB — CREATININE, SERUM
CREATININE: 1.83 mg/dL — AB (ref 0.61–1.24)
GFR calc Af Amer: 41 mL/min — ABNORMAL LOW (ref >60)
GFR, EST NON AFRICAN AMERICAN: 35 mL/min — AB (ref >60)

## 2017-08-20 LAB — TSH: TSH: 2.082 u[IU]/mL (ref 0.350–4.500)

## 2017-08-20 LAB — BRAIN NATRIURETIC PEPTIDE: B NATRIURETIC PEPTIDE 5: 654.8 pg/mL — AB (ref 0.0–100.0)

## 2017-08-20 LAB — PROTIME-INR
INR: 0.97
PROTHROMBIN TIME: 12.8 s (ref 11.4–15.2)

## 2017-08-20 LAB — CBG MONITORING, ED: Glucose-Capillary: 121 mg/dL — ABNORMAL HIGH (ref 65–99)

## 2017-08-20 LAB — TROPONIN I: TROPONIN I: 0.32 ng/mL — AB (ref <0.03)

## 2017-08-20 MED ORDER — METFORMIN HCL ER 500 MG PO TB24
500.0000 mg | ORAL_TABLET | Freq: Every day | ORAL | Status: DC
  Administered 2017-08-21: 500 mg via ORAL
  Filled 2017-08-20: qty 1

## 2017-08-20 MED ORDER — ATORVASTATIN CALCIUM 80 MG PO TABS
80.0000 mg | ORAL_TABLET | Freq: Every day | ORAL | Status: DC
  Administered 2017-08-20: 80 mg via ORAL
  Filled 2017-08-20: qty 1

## 2017-08-20 MED ORDER — NITROGLYCERIN 0.4 MG SL SUBL
0.4000 mg | SUBLINGUAL_TABLET | SUBLINGUAL | Status: DC | PRN
Start: 2017-08-20 — End: 2017-08-21

## 2017-08-20 MED ORDER — FUROSEMIDE 10 MG/ML IJ SOLN
60.0000 mg | Freq: Every day | INTRAMUSCULAR | Status: DC
  Administered 2017-08-20 – 2017-08-21 (×2): 60 mg via INTRAVENOUS
  Filled 2017-08-20 (×2): qty 6

## 2017-08-20 MED ORDER — HEPARIN SODIUM (PORCINE) 5000 UNIT/ML IJ SOLN
5000.0000 [IU] | Freq: Three times a day (TID) | INTRAMUSCULAR | Status: DC
  Administered 2017-08-20 – 2017-08-21 (×2): 5000 [IU] via SUBCUTANEOUS
  Filled 2017-08-20 (×2): qty 1

## 2017-08-20 MED ORDER — ONDANSETRON HCL 4 MG/2ML IJ SOLN: 4.0000 mg | Freq: Four times a day (QID) | INTRAMUSCULAR | Status: DC | PRN

## 2017-08-20 MED ORDER — COLCHICINE 0.6 MG PO TABS
0.6000 mg | ORAL_TABLET | Freq: Every day | ORAL | Status: DC
  Administered 2017-08-21: 0.6 mg via ORAL
  Filled 2017-08-20: qty 1

## 2017-08-20 MED ORDER — ALBUTEROL SULFATE (2.5 MG/3ML) 0.083% IN NEBU: 2.5000 mg | INHALATION_SOLUTION | RESPIRATORY_TRACT | Status: DC | PRN

## 2017-08-20 MED ORDER — ACETAMINOPHEN 325 MG PO TABS: 650.0000 mg | ORAL_TABLET | ORAL | Status: DC | PRN

## 2017-08-20 MED ORDER — OXYCODONE-ACETAMINOPHEN 10-325 MG PO TABS: 1.0000 | ORAL_TABLET | Freq: Three times a day (TID) | ORAL | Status: DC

## 2017-08-20 MED ORDER — NITROGLYCERIN 0.4 MG SL SUBL: 0.4000 mg | SUBLINGUAL_TABLET | SUBLINGUAL | Status: DC | PRN

## 2017-08-20 MED ORDER — SERTRALINE HCL 50 MG PO TABS
50.0000 mg | ORAL_TABLET | Freq: Every day | ORAL | Status: DC
  Administered 2017-08-21: 50 mg via ORAL
  Filled 2017-08-20: qty 1

## 2017-08-20 MED ORDER — OMEGA-3-ACID ETHYL ESTERS 1 G PO CAPS
2.0000 g | ORAL_CAPSULE | Freq: Two times a day (BID) | ORAL | Status: DC
  Administered 2017-08-20 – 2017-08-21 (×2): 2 g via ORAL
  Filled 2017-08-20 (×2): qty 2

## 2017-08-20 MED ORDER — TICAGRELOR 90 MG PO TABS
90.0000 mg | ORAL_TABLET | Freq: Two times a day (BID) | ORAL | Status: DC
  Administered 2017-08-20 – 2017-08-21 (×2): 90 mg via ORAL
  Filled 2017-08-20 (×2): qty 1

## 2017-08-20 MED ORDER — TIOTROPIUM BROMIDE MONOHYDRATE 18 MCG IN CAPS
18.0000 ug | ORAL_CAPSULE | Freq: Every day | RESPIRATORY_TRACT | Status: DC
Start: 2017-08-21 — End: 2017-08-21
  Administered 2017-08-21: 18 ug via RESPIRATORY_TRACT
  Filled 2017-08-20: qty 5

## 2017-08-20 MED ORDER — ASPIRIN 81 MG PO CHEW
324.0000 mg | CHEWABLE_TABLET | Freq: Once | ORAL | Status: AC
  Administered 2017-08-20: 324 mg via ORAL
  Filled 2017-08-20: qty 4

## 2017-08-20 MED ORDER — OXYCODONE HCL 5 MG PO TABS: 5.0000 mg | ORAL_TABLET | Freq: Three times a day (TID) | ORAL | Status: DC | PRN

## 2017-08-20 MED ORDER — CLONAZEPAM 0.5 MG PO TABS
1.0000 mg | ORAL_TABLET | Freq: Two times a day (BID) | ORAL | Status: DC
Start: 2017-08-20 — End: 2017-08-21
  Administered 2017-08-20 – 2017-08-21 (×2): 1 mg via ORAL
  Filled 2017-08-20 (×2): qty 2

## 2017-08-20 MED ORDER — ASPIRIN EC 81 MG PO TBEC
81.0000 mg | DELAYED_RELEASE_TABLET | Freq: Every day | ORAL | Status: DC
  Administered 2017-08-21: 81 mg via ORAL
  Filled 2017-08-20: qty 1

## 2017-08-20 MED ORDER — ALLOPURINOL 300 MG PO TABS
300.0000 mg | ORAL_TABLET | Freq: Every day | ORAL | Status: DC
Start: 2017-08-21 — End: 2017-08-21
  Administered 2017-08-21: 300 mg via ORAL
  Filled 2017-08-20: qty 1

## 2017-08-20 MED ORDER — OXYCODONE-ACETAMINOPHEN 5-325 MG PO TABS: 1.0000 | ORAL_TABLET | Freq: Three times a day (TID) | ORAL | Status: DC | PRN

## 2017-08-20 NOTE — ED Triage Notes (Signed)
Pt arrived from PCP office via [facility] c/o lethargy and weakness.  Pt was seen last week here for MI and had 2 stents placed.  A&Ox4.  States right side facial droop is normal.  Denies SOB and chest pain

## 2017-08-20 NOTE — ED Notes (Signed)
Pt is sitting on the side of the stretcher eating.  Denies any pain but reports feeling tired.

## 2017-08-20 NOTE — ED Notes (Signed)
Called 3E to give report, per Danae Chen, receiving nurse is in an isolation room and will return call.
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
## 2017-08-20 NOTE — H&P (Signed)
Cardiology Admission History and Physical:   Patient ID: DOSSIE SWOR; MRN: [id number]; DOB: Jul 08, 1946   Admission date: 08/20/2017  Primary Care Provider: [name], MD Primary Cardiologist: Shelva Majestic, MD   Chief Complaint:  SOB  Patient Profile:   Bobby [name] is a 71 y.o. male with recent STEMI, HTN, HLD, COPD, ICM, pulmonary HTN and stroke presented for SOB.   Admitted 08/09/17-08/14/17 for acute anterior ST elevation.. Emergent cath showed diffusely diseased LAD with subtotal occlusion. Had successful PCI of the mLAD with TIMI grade III flow, though second diagonal was jailed with continued TIMI grade III flow. 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 He underwent staged intervention with PCI/DES to the OM and Lcx. Given his low EF he was continued on BB with low dose ARB added. Discharge weight 255lb.   History of Present Illness:   Bobby [name] not fell since discharge.  He continues to have dyspnea on exertion.  He denies any chest pain.  He also has orthopnea without PND or lower extremity edema.  seems he is not compliant with low-sodium diet.  Takes his medication as prescribed.  He also complains of left forearm pain radiating to his finger.  His symptoms is different from recent STEMI.  Due to ongoing symptoms he presented to ER for further evaluation.  Patient complains of fatigue and weakness.  Denies dizziness, palpitation, chest pain, syncope, melena or blood in his stool or urine.  EKG shows sinus rhythm with chronic left bundle branch block at a rate of 48 bpm-personally reviewed.  No acute changes from prior EKG.  BNP 654.  Creatinine 1.58.  Chest x-ray unremarkable.  Troponin  0.32.  Trending down from last admission.   Past Medical History:  Diagnosis Date  . CAD (coronary artery disease),residual non obstructive disease    a. 08/2012 Inferior STEMI: RCA 99% (3.5x32  Promus DES), nonobs LAD/LCX dzs;  b. 01/2013 MV: EF 66%, small inferoseptal artifact and possible small inferobasilar scar;  c. 03/2014 MV: fixed inf defect->diaph attenuation, no ischemia. 12/18 PCI/DES to mLAD, staged PCI/DES Lcx/OM  . DM (diabetes mellitus), type 2 new diagnosis 08/05/2012   diet control  . ED (erectile dysfunction)    surgery planned  . History of echocardiogram    a. 08/2012 Echo: EF 55-60%, possible mild basal inferior HK, mildly dil RA, PASP 21mmHg.  [name] Hyperlipidemia 08/05/2012  . Hypertensive heart disease   . MI (myocardial infarction) ([place]) 08/04/2012   acute inferior stemi secondary to RCA occlusion; PCI  . Panic attack   . Pulmonary HTN ([place])    echo 08/05/12, EF 55-60%, PA pressure 71mm  . S/P coronary artery stent placement, to RCA Promus DES 08/05/2012  . Stroke [facility])    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  . Tobacco abuse     Past Surgical History:  Procedure Laterality Date  . CARDIAC CATHETERIZATION  08/04/12   PCI to RCA with DES  . CORONARY STENT INTERVENTION N/A 08/12/2017   Procedure: CORONARY STENT INTERVENTION;  Surgeon: [name], Ahmir Bracken M, MD;  Location: [place] CV LAB;  Service: Cardiovascular;  Laterality: N/A;  . CORONARY/GRAFT ACUTE MI REVASCULARIZATION N/A 08/09/2017   Procedure: Coronary/Graft Acute MI Revascularization;  Surgeon: Belva Crome, MD;  Location: [place] [facility] LAB;  Service: Cardiovascular;  Laterality: N/A;  . LEFT HEART CATH AND CORONARY ANGIOGRAPHY N/A 08/09/2017   Procedure: LEFT HEART CATH AND CORONARY ANGIOGRAPHY;  Surgeon: Belva Crome, MD;  Location: [place] CV LAB;  Service: Cardiovascular;  Laterality: N/A;  . LEFT HEART CATHETERIZATION WITH CORONARY ANGIOGRAM N/A 08/04/2012   Procedure: LEFT HEART CATHETERIZATION WITH CORONARY ANGIOGRAM;  Surgeon: Troy Sine, MD;  Location: [facility] CATH LAB;  Service: Cardiovascular;  Laterality: N/A;  . LEG SURGERY Left    rod placed for fracture repair  . PENILE PROSTHESIS IMPLANT N/A  06/03/2014   Procedure: IMPLANT PENILE PROTHESIS INFLATABLE;  Surgeon: Ailene Rud, MD;  Location: [facility] ORS;  Service: Urology;  Laterality: N/A;  with penile block--0.5% marcaine plain  . PERCUTANEOUS CORONARY STENT INTERVENTION (PCI-S) Right 08/04/2012   Procedure: PERCUTANEOUS CORONARY STENT INTERVENTION (PCI-S);  Surgeon: Troy Sine, MD;  Location: [facility] CATH LAB;  Service: Cardiovascular;  Laterality: Right;  . THUMB ARTHROSCOPY Left    thumb joint replaced     Medications Prior to Admission: Prior to Admission medications   Medication Sig Start Date End Date Taking? Authorizing Provider  albuterol (PROVENTIL) (2.5 MG/3ML) 0.083% nebulizer solution Take 5 mg by nebulization every 4 (four) hours as needed. For wheezing or SOB   Yes [provider]  allopurinol (ZYLOPRIM) 300 MG tablet Take 300 mg by mouth daily.   Yes [provider]  amLODipine (NORVASC) 5 MG tablet Take 5 mg by mouth daily. 08/17/16  Yes [provider]  aspirin 81 MG tablet Take 81 mg by mouth daily.   Yes [provider]  atorvastatin (LIPITOR) 80 MG tablet Take 1 tablet (80 mg total) by mouth daily at 6 PM. 08/14/17  Yes Cheryln Manly, NP  clonazePAM (KLONOPIN) 0.5 MG tablet Take 1 mg by mouth 2 (two) times daily.  06/01/17  Yes [provider]  COLCRYS 0.6 MG tablet Take 0.6 mg by mouth daily. 03/21/17  Yes [provider]  losartan (COZAAR) 25 MG tablet Take 1 tablet (25 mg total) by mouth daily. 08/15/17  Yes Reino Bellis B, NP  metFORMIN (GLUCOPHAGE-XR) 500 MG 24 hr tablet Take 500 mg by mouth daily. 03/21/17  Yes [provider]  metoprolol succinate (TOPROL-XL) 100 MG 24 hr tablet Take 100 mg by mouth daily. 08/17/16  Yes [provider]  nitroGLYCERIN (NITROSTAT) 0.4 MG SL tablet Place 1 tablet (0.4 mg total) under the tongue every 5 (five) minutes x 3 doses as needed for chest pain. 08/14/17  Yes Cheryln Manly, NP  omega-3  acid ethyl esters (LOVAZA) 1 g capsule Take 2 capsules (2 g total) by mouth 2 (two) times daily. 08/14/17  Yes Cheryln Manly, NP  oxyCODONE-acetaminophen (PERCOCET) 10-325 MG per tablet Take 1 tablet by mouth 3 (three) times daily.    Yes [provider]  sertraline (ZOLOFT) 50 MG tablet Take 50 mg by mouth daily. 03/21/17  Yes [provider]  ticagrelor (BRILINTA) 90 MG TABS tablet Take 1 tablet (90 mg total) by mouth 2 (two) times daily. 08/14/17  Yes Cheryln Manly, NP  Tiotropium Bromide-Olodaterol (STIOLTO RESPIMAT) 2.5-2.5 MCG/ACT AERS Inhale 2 puffs into the lungs daily.   Yes [provider]     Allergies:    Allergies  Allergen Reactions  . Hydrocodone Itching    Social History:   Social History   Socioeconomic History  . Marital status: Legally Separated    Spouse name: Not on file  . Number of children: Not on file  . Years of education: Not on file  . Highest education level: Not on file  Social Needs  . Financial resource strain: Not on file  . Food insecurity - worry: Not on file  . Food insecurity - inability: Not on file  . Transportation needs - medical: Not on file  . Transportation needs - non-medical: Not on file  Occupational History  . Not on file  Tobacco Use  . Smoking status: Current Every Day Smoker    Packs/day: 1.00    Years: 56.00    Pack years: 56.00    Types: Cigarettes  . Smokeless tobacco: Never Used  . Tobacco comment: smoking now 1 ppd  Substance and Sexual Activity  . Alcohol use: Yes    Comment: ocasionally  . Drug use: No  . Sexual activity: Not on file  Other Topics Concern  . Not on file  Social History Narrative  . Not on file    Family History: The patient's family history includes Heart attack in his paternal grandmother; Heart disease in his maternal grandmother; Kidney disease in his father; Stomach cancer in his maternal grandfather.    ROS:  Please see the history of present illness.    All other ROS reviewed and negative.     Physical Exam/Data:   Vitals:   08/20/17 1415 08/20/17 1430 08/20/17 1445 08/20/17 1500  BP: 104/71 117/62 (!) 114/97 109/74  Pulse: (!) 47 (!) 50 (!) 50 (!) 53  Resp: 10 16 10 15   SpO2: 95% 96% 95% 95%  Weight:      Height:       No intake or output data in the 24 hours ending 08/20/17 1601 Filed Weights   08/20/17 1339  Weight: 255 lb (115.7 kg)   Body mass index is 37.66 kg/m.  General: Moderately obese male seems uncomfortable  HEENT: normal Lymph: no adenopathy Neck: JVD is difficult to assess due to body habituate Endocrine:  No thryomegaly Vascular: No carotid bruits; FA pulses 2+ bilaterally without bruits  Cardiac:  normal S1, S2; RRR; no murmur Lungs:  clear to auscultation bilaterally, no wheezing, rhonchi or rales  Abd: soft, nontender, no hepatomegaly  Ext: no  edema Musculoskeletal:  No deformities, BUE and BLE strength normal and equal Skin: warm and dry  Neuro:  CNs 2-12 intact, no focal abnormalities noted Psych:  Normal affect   Relevant CV Studies: Cath: 08/09/17  Conclusion    Acute anteroapical ST elevation myocardial infarction due to acute occlusion of the mid LAD at the second diagonal. Diffusely diseased LAD with subtotal occlusion occurring in a longer segment of involvement  Left dominant coronary anatomy.  Widely patent left main coronary artery  Dominant circumflex with 99% mid vessel stenosis and 80% stenosis in a large first obtuse marginal branch  Widely patent nondominant right coronary previously stented without evidence of restenosis or obstruction.  Successful culprit PCI of the LAD using a 2 5 x 22 mm Onyx DES postdilated to 3 mm in diameter with TIMI grade III flow. The second diagonal was jailed but maintained TIMI grade III flow. There is mild to moderate disease proximal and distal to the stent. TIMI grade III flow was noted.   RECOMMENDATIONS:   Staged PCI of OM1 and  mid circumflex on Monday -needs to be scheduled.  Close follow-up with kidney function is a patient has stage III chronic kidney disease and received greater than 200 cc of contrast during the procedure.  IV nitroglycerin  Continue Brilinta 90 mg p.o. twice daily  Aggressive lipid management.   TTE: 08/09/17  Study Conclusions  -  Left ventricle: The cavity size was normal. There was moderate concentric hypertrophy. Systolic function was moderately to severely reduced. The estimated ejection fraction was in the range of 30% to 35%. Doppler parameters are consistent with abnormal left ventricular relaxation (grade 1 diastolic dysfunction). Doppler parameters are consistent with elevated ventricular end-diastolic filling pressure. - Aortic valve: Trileaflet; normal thickness leaflets. There was no regurgitation. - Mitral valve: There was no regurgitation. - Right atrium: The atrium was normal in size. - Tricuspid valve: There was mild regurgitation. - Pulmonary arteries: Systolic pressure was within the normal range.  Impressions:  - When compared to the prior study from 06/19/2017 there is a significant diference, LVEF has dropped from 55-60% to 30-35%. There is new large aneurysm involving the mid anteroseptal, anterior and all apical segments consistent with an infarct in the LAD territory. There is sludge (blood layering and heavy swirling) as a sign of pre-thrombotic state, but no definite thrombus is seen in the apex.  Cath: 08/12/17  Conclusion     Previously placed Prox LAD to Mid LAD stent (unknown type) is widely patent.  Mid Cx lesion is 99% stenosed.  A drug-eluting stent was successfully placed using a STENT SIERRA 3.00 X 28 MM.  Post intervention, there is a 0% residual stenosis.  Ost 1st Mrg to 1st Mrg lesion is 85% stenosed.  A drug-eluting stent was successfully placed using a STENT RESOLUTE ONYX  3.0X18.  Post intervention, there is a 0% residual stenosis.  1. Successful stenting of the mid LCx with a DES 2. Successful stenting of the first OM with DES  Plan: DAPT for at least one year. Hydrate. Anticipate DC in am if renal function is stable and no complication.   Laboratory Data:  Chemistry Recent Labs  Lab 08/14/17 0718 08/20/17 1358  NA 135 132*  K 3.9 4.9  CL 104 104  CO2 24 20*  GLUCOSE 113* 115*  BUN 13 26*  CREATININE 1.18 1.58*  CALCIUM 9.5 9.4  GFRNONAA >60 42*  GFRAA >60 49*  ANIONGAP 7 8    Recent Labs  Lab 08/20/17 1358  PROT 6.9  ALBUMIN 3.7  AST 26  ALT 44  ALKPHOS 106  BILITOT 0.4   Hematology Recent Labs  Lab 08/20/17 1358  WBC 8.5  RBC 4.60  HGB 13.7  HCT 41.4  MCV 90.0  MCH 29.8  MCHC 33.1  RDW 14.8  PLT 245   Cardiac Enzymes Recent Labs  Lab 08/20/17 1358  TROPONINI 0.32*   BNP Recent Labs  Lab 08/20/17 1359  BNP 654.8*     Radiology/Studies:  Dg Chest Portable 1 View  Result Date: 08/20/2017 CLINICAL DATA:  Chest pain today. EXAM: PORTABLE CHEST 1 VIEW COMPARISON:  PA and lateral chest 06/28/2017 and 05/28/2014. FINDINGS: The lungs are clear. Heart size is normal. No pneumothorax or pleural effusion. No bony abnormality. IMPRESSION: Negative chest. Electronically Signed   By: Inge Rise M.D.   On: 08/20/2017 14:43    Assessment and Plan:   1. Acute systolic heart failure/ischemic cardiomyopathy -BNP 654.  He was not discharged on any diuretics.  His weight is stable at discharge.  History unreliable, seems noncompliant with salt intake. -Start IV Lasix 60 mg twice daily.  Watch renal function closely with diuresis. -Hold home dose of losartan 25 mg daily due to AKI. Stop Toprol XL 100mg  Qd. Start bisoprolol  tomorrow when BP stable.  - He will need heart failure education at discharge.   2. CAD -  Recent stenting to LAD, LCx and OM. Troponin 0.32. Trending down from last admission.  - Continue ASA,  Brillinta, statin and BB.   3. Sinus bradycardia - May be contributing to some of his symptoms. change BB as above.   4. HTN - Stable. Meds changes as above. Stop Norvasc for soft blood pressure. Plan to up titrate ARB and BB.   5. AKI - AS above. Hold losartan for AKI.   6. HLD - 08/09/2017: Cholesterol 152; HDL 32; LDL Cholesterol 68; Triglycerides 259; VLDL 52  - Continue statin.  7. COPD - Start Spiriva and albuterol. Start nebs.   8. OSA - outpatient evaluation.   Severity of Illness: The appropriate patient status for this patient is OBSERVATION. Observation status is judged to be reasonable and necessary in order to provide the required intensity of service to ensure the patient's safety. The patient's presenting symptoms, physical exam findings, and initial radiographic and laboratory data in the context of their medical condition is felt to place them at decreased risk for further clinical deterioration. Furthermore, it is anticipated that the patient will be medically stable for discharge from the hospital within 2 midnights of admission. The following factors support the patient status of observation.    " The patient's presenting symptoms include . SOB " The physical exam findings include wheezing  " The initial radiographic and laboratory data are elevated BNP     For questions or updates, please contact [place] Please consult [email or web address] for contact info under Cardiology/STEMI.    Mahalia Longest [place], [place]  08/20/2017 4:01 PM   Patient seen and examined and history reviewed. Agree with above findings and plan. Recently discharged after MI and coronary interventions. EF 35%. Since DC he has persistent dyspnea progressing to the point that he gets SOB with any exertion and talking on the phone. Is not using Spiriva or inhaler. Reports quitting smoking 2 weeks ago. Very sedentary.   On exam he is obese. Plethoric. Bradycardic.  Lungs with diffuse exp  wheezes and crackles in base.  CV RRR without gallop or murmur. Abd obese soft NT Trace to mild edema.  Ecg shows NSR with RBBB and evolving changes of anterior infarct.   CXR shows NAD  Impression: worsening dyspnea is multifactorial. There is a component of acute on chronic systolic/diastolic CHF. There is COPD exacerbation. Will hold beta blocker due to bradycardia. Once bradycardiac improves would add a selective beta blocker like bisoprolol. Diuresis with IV lasix. Resume Spiriva and use nebulizer therapy. Patient may have a component of OSA. He did have a sleep study 5 years ago showing mild OSA and CPAP was not recommended. This may need to be readdressed. Will admit for observation and see how he responds to breathing treatments and diuretics.   Bobby [name], [place] 08/20/2017 5:27 PM

## 2017-08-21 ENCOUNTER — Other Ambulatory Visit: Payer: Self-pay

## 2017-08-21 DIAGNOSIS — I5021 Acute systolic (congestive) heart failure: Secondary | ICD-10-CM | POA: Diagnosis not present

## 2017-08-21 DIAGNOSIS — I110 Hypertensive heart disease with heart failure: Secondary | ICD-10-CM | POA: Diagnosis not present

## 2017-08-21 DIAGNOSIS — J441 Chronic obstructive pulmonary disease with (acute) exacerbation: Secondary | ICD-10-CM | POA: Diagnosis not present

## 2017-08-21 LAB — BASIC METABOLIC PANEL
ANION GAP: 13 (ref 5–15)
BUN: 33 mg/dL — ABNORMAL HIGH (ref 6–20)
CALCIUM: 9.9 mg/dL (ref 8.9–10.3)
CHLORIDE: 101 mmol/L (ref 101–111)
CO2: 21 mmol/L — AB (ref 22–32)
Creatinine, Ser: 1.72 mg/dL — ABNORMAL HIGH (ref 0.61–1.24)
GFR calc non Af Amer: 38 mL/min — ABNORMAL LOW (ref >60)
GFR, EST AFRICAN AMERICAN: 44 mL/min — AB (ref >60)
Glucose, Bld: 89 mg/dL (ref 65–99)
POTASSIUM: 4.1 mmol/L (ref 3.5–5.1)
Sodium: 135 mmol/L (ref 135–145)

## 2017-08-21 MED ORDER — FUROSEMIDE 40 MG PO TABS: 40.0000 mg | ORAL_TABLET | Freq: Every day | ORAL | 11 refills | Status: DC

## 2017-08-21 MED ORDER — ALBUTEROL SULFATE HFA 108 (90 BASE) MCG/ACT IN AERS: 2.0000 | INHALATION_SPRAY | Freq: Four times a day (QID) | RESPIRATORY_TRACT | 2 refills | Status: AC | PRN

## 2017-08-21 MED ORDER — POTASSIUM CHLORIDE ER 10 MEQ PO TBCR: 10.0000 meq | EXTENDED_RELEASE_TABLET | Freq: Every day | ORAL | 6 refills | Status: DC

## 2017-08-21 NOTE — Discharge Summary (Signed)
Discharge Summary    Patient ID: Bobby [name],  MRN: [id number], DOB/AGE: May 27, 1946 71 y.o.  Admit date: 08/20/2017 Discharge date: 08/21/2017  Primary Care Provider: Hayden Rasmussen Primary Cardiologist: Dr. Claiborne Billings   Discharge Diagnoses    Active Problems:   COPD exacerbation ([place])   Acute systolic CHF (congestive heart failure) ([place])   S/p Recent STEMI   CAD   HTN   HLD   ICM   Obesity    AKI  Allergies Allergies  Allergen Reactions  . Hydrocodone Itching    Diagnostic Studies/Procedures    None this admission.    History of Present Illness     Bobby [name] is a 71 y.o. male with hx CAD and recent STEMI, HTN, HLD, COPD, ICM, pulmonary HTN and stroke presented for SOB 08/20/17.  He had inferior MI in December 2013 and underwent DES to RCA  Admitted 08/09/17-08/14/17 for acute anterior ST elevation.. Emergent cath showed diffusely diseased LAD with subtotal occlusion. Had successful PCI of the mLAD with TIMI grade III flow, though second diagonal was jailed with continued TIMI grade III flow. 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 He underwent staged intervention with PCI/DES to the OM and Lcx. Given his low EF he was continued on BB with low doseARBadded.Discharge weight 255lb.   Bobby [name] not felt well since discharge.  He continues to have dyspnea on exertion. progressing to the point that he gets SOB with any exertion and talking on the phone. Is not using Spiriva or inhaler. Reports quitting smoking 2 weeks ago. Very sedentary.  He denies any chest pain.  He also has orthopnea without PND or lower extremity edema.  Seems he is not compliant with low-sodium diet.  Takes his medication as prescribed.  He also complains of left forearm pain radiating to his finger.  His symptoms is different from recent STEMI.  Due to ongoing symptoms he presented to ER for further  evaluation.  Patient complains of fatigue and weakness.  Denies dizziness, palpitation, chest pain, syncope, melena or blood in his stool or urine.  EKG shows sinus rhythm with chronic left bundle branch block at a rate of 48 bpm-personally reviewed.  No acute changes from prior EKG.  BNP 654.  Creatinine 1.58.  Chest x-ray unremarkable.  Troponin  0.32. Trending down from last admission.     Hospital Course     Consultants: None  1. Acute systolic heart failure/ischemic cardiomyopathy His dyspnea felt multifactorial. Combination of acute CHF and COPD exacerbation. Given IV lasix 60mg  x 2 with 2L of diuresis. SCr increase midly. Wanted to keep for one more day for observation however patient wanted to go home. He ambulated well with nurse with normal oxygen saturation. Will discharge home on Lasix 40mg  qd with Kdur 73meq qd. Continue to hold BB and ARB. Once bradycardiac improves would add a selective beta blocker like bisoprolol. Resume ARB once stable renal function.   2. CAD - Recent stenting to LAD, LCx and OM. Troponin 0.32. Trended down from last admission. No chest pain.  - Continue ASA, Brillinta, statin and BB.   3. Sinus bradycardia - May be contributing to some of his symptoms. Held his metoprolol for HR in high 40s. Overnight rate improved to low 60s. Add BB as outpatient.   4. HTN - SOB was soft low during admission. Continue to hold ARB and BB  as above. Will stop Norvasc for soft blood pressure and plan to up titrate ARB and BB as outpatient.   5. AKI - AS above. Hold losartan.   6. HLD - 08/09/2017: Cholesterol 152; HDL 32; LDL Cholesterol 68; Triglycerides 259; VLDL 52  - Continue statin.  7. COPD - Is not using Spiriva or inhaler at home. Wheezing improved with nebulizer. Advised to resume home inhaler. Given Rx to albuterol inhaler. F/u with PCP as outpatient.   8. OSA - Patient may have a component of OSA. He did have a sleep study 5 years ago showing mild  OSA and CPAP was not recommended. This may need to be readdressed.   The patient has been seen by Dr. Martinique  today and deemed ready for discharge home. All follow-up appointments have been scheduled. Discharge medications are listed below.     Discharge Vitals Blood pressure (!) 107/54, pulse 64, temperature 98.4 F (36.9 C), temperature source Oral, resp. rate 16, height 5\' 9"  (1.753 m), weight 247 lb 3.2 oz (112.1 kg), SpO2 93 %.  Filed Weights   08/20/17 1339 08/20/17 2111 08/21/17 0520  Weight: 255 lb (115.7 kg) 251 lb 6.4 oz (114 kg) 247 lb 3.2 oz (112.1 kg)    Labs & Radiologic Studies     CBC Recent Labs    08/20/17 1358 08/20/17 2106  WBC 8.5 8.8  NEUTROABS 4.9  --   HGB 13.7 14.5  HCT 41.4 43.6  MCV 90.0 89.7  PLT 245 169   Basic Metabolic Panel Recent Labs    08/20/17 1358 08/20/17 2106 08/21/17 0404  NA 132*  --  135  K 4.9  --  4.1  CL 104  --  101  CO2 20*  --  21*  GLUCOSE 115*  --  89  BUN 26*  --  33*  CREATININE 1.58* 1.83* 1.72*  CALCIUM 9.4  --  9.9  MG 2.0  --   --    Liver Function Tests Recent Labs    08/20/17 1358  AST 26  ALT 44  ALKPHOS 106  BILITOT 0.4  PROT 6.9  ALBUMIN 3.7   Cardiac Enzymes Recent Labs    08/20/17 1358  TROPONINI 0.32*   Thyroid Function Tests Recent Labs    08/20/17 2106  TSH 2.082    Ct Head Wo Contrast  Result Date: 08/20/2017 CLINICAL DATA:  Slurred speech and altered mental status EXAM: CT HEAD WITHOUT CONTRAST TECHNIQUE: Contiguous axial images were obtained from the base of the skull through the vertex without intravenous contrast. COMPARISON:  Brain MRI 02/01/2009 FINDINGS: Brain: No evidence of acute infarction, hemorrhage, hydrocephalus, extra-axial collection or mass lesion/mass effect. A low density over the right pons on coronal reformats has a bandlike appearance on sagittal reformats, more consistent with artifact rather than small-vessel infarct. Signal abnormality in the left middle  cerebellar peduncle on a 2010 brain MRI is not well resolved. Left subinsular dilated perivascular space based on MRI prior. Vascular: Atherosclerotic calcification.  No hyperdense vessel. Skull: No acute finding. Sinuses/Orbits: Mild patchy sinus opacification. Small left sphenoid sinus fluid level. Negative orbits. IMPRESSION: No acute finding. Electronically Signed   By: Monte Fantasia M.D.   On: 08/20/2017 16:27   Dg Chest Portable 1 View  Result Date: 08/20/2017 CLINICAL DATA:  Chest pain today. EXAM: PORTABLE CHEST 1 VIEW COMPARISON:  PA and lateral chest 06/28/2017 and 05/28/2014. FINDINGS: The lungs are clear. Heart size is normal. No pneumothorax or pleural effusion. No  bony abnormality. IMPRESSION: Negative chest. Electronically Signed   By: Inge Rise M.D.   On: 08/20/2017 14:43    Disposition   Pt is being discharged home today in good condition.  Follow-up Plans & Appointments    Follow-up Information    Hayden Rasmussen, MD. Go on 08/30/2017.   Specialty:  Family Medicine Why:  @3 :30pm Contact information: [place] [address] [phone]        [name], [place]. Go on 08/28/2017.   Specialties:  Cardiology, Radiology Why:  @10 :00 am for TCM follow up  Contact information: [address] [place] [address] [phone]          Discharge Instructions    Diet - low sodium heart healthy   Complete by:  As directed    Discharge instructions   Complete by:  As directed    *Weigh yourself on the same scale at same time of day and keep a log. *Report weight gain of > 3 lbs in 1 day or 5 lbs over the course of a week and/or symptoms of excess fluid (shortness of breath, difficulty lying flat, swelling, poor appetite, abdominal fullness/bloating, etc) to your doctor immediately. *Avoid foods that are high in sodium (processed, pre-packaged/canned goods, fast foods, etc). *Please attend all scheduled and  reccommended follow up appointments    Please bring all medication during follow up.   Increase activity slowly   Complete by:  As directed       Discharge Medications   Allergies as of 08/21/2017      Reactions   Hydrocodone Itching      Medication List    STOP taking these medications   amLODipine 5 MG tablet Commonly known as:  NORVASC   losartan 25 MG tablet Commonly known as:  COZAAR   metoprolol succinate 100 MG 24 hr tablet Commonly known as:  TOPROL-XL     TAKE these medications   albuterol (2.5 MG/3ML) 0.083% nebulizer solution Commonly known as:  PROVENTIL Take 5 mg by nebulization every 4 (four) hours as needed. For wheezing or SOB What changed:  Another medication with the same name was added. Make sure you understand how and when to take each.   albuterol 108 (90 Base) MCG/ACT inhaler Commonly known as:  PROVENTIL HFA;VENTOLIN HFA Inhale 2 puffs into the lungs every 6 (six) hours as needed for wheezing or shortness of breath. What changed:  You were already taking a medication with the same name, and this prescription was added. Make sure you understand how and when to take each.   allopurinol 300 MG tablet Commonly known as:  ZYLOPRIM Take 300 mg by mouth daily.   aspirin 81 MG tablet Take 81 mg by mouth daily.   atorvastatin 80 MG tablet Commonly known as:  LIPITOR Take 1 tablet (80 mg total) by mouth daily at 6 PM.   clonazePAM 0.5 MG tablet Commonly known as:  KLONOPIN Take 1 mg by mouth 2 (two) times daily.   COLCRYS 0.6 MG tablet Generic drug:  colchicine Take 0.6 mg by mouth daily.   furosemide 40 MG tablet Commonly known as:  LASIX Take 1 tablet (40 mg total) by mouth daily.   metFORMIN 500 MG 24 hr tablet Commonly known as:  GLUCOPHAGE-XR Take 500 mg by mouth daily.   nitroGLYCERIN 0.4 MG SL tablet Commonly known as:  NITROSTAT Place 1 tablet (0.4 mg total) under the tongue every 5 (five) minutes x  3 doses as needed for chest  pain.   omega-3 acid ethyl esters 1 g capsule Commonly known as:  LOVAZA Take 2 capsules (2 g total) by mouth 2 (two) times daily.   oxyCODONE-acetaminophen 10-325 MG tablet Commonly known as:  PERCOCET Take 1 tablet by mouth 3 (three) times daily.   potassium chloride 10 MEQ tablet Commonly known as:  K-DUR Take 1 tablet (10 mEq total) by mouth daily.   sertraline 50 MG tablet Commonly known as:  ZOLOFT Take 50 mg by mouth daily.   STIOLTO RESPIMAT 2.5-2.5 MCG/ACT Aers Generic drug:  Tiotropium Bromide-Olodaterol Inhale 2 puffs into the lungs daily.   ticagrelor 90 MG Tabs tablet Commonly known as:  BRILINTA Take 1 tablet (90 mg total) by mouth 2 (two) times daily.        Outstanding Labs/Studies   BMET during follow up  Duration of Discharge Encounter   Greater than 30 minutes including physician time.  Signed, Leanor Kail PA-C 08/21/2017, 12:39 PM

## 2017-08-21 NOTE — Progress Notes (Signed)
Patient slept mostly during the night. VS stable, patient denies pain but he stated he still feels SOB especially when walking.   Will continue to monitor.  Fay Bagg, RN

## 2017-08-21 NOTE — Progress Notes (Signed)
Patient given discharge instructions and all questions answered.  

## 2017-08-21 NOTE — Progress Notes (Signed)
Patient ambulated in hallway, oxygen saturation 93-97% on room air and heart rate 80s-90s.  Patient ambulated with no assistance and tolerated well.

## 2017-08-22 ENCOUNTER — Telehealth (HOSPITAL_COMMUNITY): Payer: Self-pay

## 2017-08-22 NOTE — Telephone Encounter (Signed)
Patients insurance is active and benefits verified through [employer] B - No co-pay, no deductible, no out of pocket amount, no co-insurance, and no pre-authorization is required. Passport/reference [phone]  Patients insurance is active and benefits verified through [facility] - No co-pay, no deductible, out of pocket amount of $6,700/$0.00 has been met, no co-insurance, and no pre-authorization is required. Passport/reference [phone]  Patients insurance is active and benefits verified through Medicaid - No co-pay, no deductible, no out of pocket amount, no co-insurance, and no pre-authorization is required. Passport/reference [id number]  Patient will be contacted and scheduled after their follow up appt with the Cardiologist office upon review by the RN Navigator.

## 2017-08-26 NOTE — Telephone Encounter (Signed)
Order placed and sent to precert.   See telephone note, sister is aware.

## 2017-08-28 ENCOUNTER — Ambulatory Visit (INDEPENDENT_AMBULATORY_CARE_PROVIDER_SITE_OTHER): Payer: Medicare Other | Admitting: Physician Assistant

## 2017-08-28 ENCOUNTER — Telehealth: Payer: Self-pay | Admitting: Cardiovascular Disease

## 2017-08-28 VITALS — BP 142/90 | HR 104 | Ht 69.0 in | Wt 257.0 lb

## 2017-08-28 DIAGNOSIS — Z79899 Other long term (current) drug therapy: Secondary | ICD-10-CM

## 2017-08-28 DIAGNOSIS — E785 Hyperlipidemia, unspecified: Secondary | ICD-10-CM | POA: Diagnosis not present

## 2017-08-28 DIAGNOSIS — I2510 Atherosclerotic heart disease of native coronary artery without angina pectoris: Secondary | ICD-10-CM

## 2017-08-28 DIAGNOSIS — I2102 ST elevation (STEMI) myocardial infarction involving left anterior descending coronary artery: Secondary | ICD-10-CM | POA: Diagnosis not present

## 2017-08-28 DIAGNOSIS — I110 Hypertensive heart disease with heart failure: Secondary | ICD-10-CM

## 2017-08-28 LAB — BASIC METABOLIC PANEL
BUN/Creatinine Ratio: 18 (ref 10–24)
BUN: 35 mg/dL — ABNORMAL HIGH (ref 8–27)
CALCIUM: 10.9 mg/dL — AB (ref 8.6–10.2)
CO2: 19 mmol/L — AB (ref 20–29)
CREATININE: 1.91 mg/dL — AB (ref 0.76–1.27)
Chloride: 100 mmol/L (ref 96–106)
GFR calc Af Amer: 40 mL/min/{1.73_m2} — ABNORMAL LOW (ref >59)
GFR calc non Af Amer: 34 mL/min/{1.73_m2} — ABNORMAL LOW (ref >59)
GLUCOSE: 134 mg/dL — AB (ref 65–99)
POTASSIUM: 5.3 mmol/L — AB (ref 3.5–5.2)
SODIUM: 139 mmol/L (ref 134–144)

## 2017-08-28 MED ORDER — METOPROLOL SUCCINATE ER 25 MG PO TB24: 25.0000 mg | ORAL_TABLET | Freq: Every day | ORAL | 4 refills | Status: DC

## 2017-08-28 NOTE — Telephone Encounter (Signed)
Returned call and notified that we will have forms signed by Isaac Laud.

## 2017-08-28 NOTE — Patient Instructions (Signed)
Medication Instructions:   START Toprol-XL 25mg  DAILY  No other changes to medications.  Labwork:   BMET today  Testing/Procedures:  none  Follow-Up:  With [name] PA in 3-4 weeks  With Dr. Claiborne Billings in 3-4 months  If you need a refill on your cardiac medications before your next appointment, please call your pharmacy.

## 2017-08-28 NOTE — Progress Notes (Signed)
Cardiology Office Note    Date:  08/30/2017   ID:  BEREL NAJJAR, DOB Mar 10, 1946, MRN [id number]  PCP:  Hayden Rasmussen, MD  Cardiologist:  Dr. Claiborne Billings  Chief Complaint  Patient presents with  . Follow-up    seen for Dr. Claiborne Billings  . Headache  . Shortness of Breath    History of Present Illness:  Bobby [name] is a 71 y.o. male with PMH of DM II, HTN, HLD, pulmonary HTN, remote small TIA/CVA and CAD. He had inferior MI in December 2013 and underwent DES to RCA. Echocardiogram done at the time showed EF 50-60% with mid to basal inferior probable scar in the moderate pulmonary hypertension with PA pressure 45 mmHg. Last Myoview obtained on 03/26/2014 was low risk showed normal LV function in the normal wall motion. Fixed inferior defect most consistent with diaphragmatic attenuation, no evidence of ischemia. Abdominal ultrasound in 2017 showed normal abdominal aorta. He was last seen by Dr. Claiborne Billings on 08/02/2016, his Brilinta was transitioned to 60 mg twice a day.  I last saw the patient on 07/01/2017 for hospital follow-up after she presented with left arm pain.  Troponin was negative, echocardiogram in October showed EF 55-60%, mild LVH, grade 2 DD.  Given his atypical symptoms and a normal echocardiogram, no further workup was recommended.  He went to the hospital again on 08/09/2017 with anterior STEMI due to acute occlusion of mid LAD at the second diagonal.  He underwent successful PCI of LAD with 2.5 x 22 mm Onyx DES.  He was also noted to have a 99% dominant mid left circumflex lesion with 80% stenosis in large OM1.  He returned to the Cath Lab on 08/12/2017 and underwent DES to mid left circumflex and another DES to OM1.  Troponin peaked at greater than 65.  Echocardiogram obtained on 08/09/2017 showed EF 30-35%, new large aneurysm involving mid anterior septal, anterior and apical region consistent with infarct in LAD territory, Grade 1 DD.  He returned to the hospital on 08/20/2017 with  malaise and the dyspnea on exertion.  EKG showed chronic left bundle branch block with heart rate of 48.  Beta-blocker was held due to bradycardia.  It was felt he had a component of acute on chronic systolic and diastolic heart failure and also COPD exacerbation.  He was diuresed with IV Lasix.  And was eventually discharged on 08/21/2017.  His discharge weight was 247 pounds.  Patient presents today for cardiology office visit.  He has not had any chest pain since discharge.  Unfortunately, his Toprol-XL 100 mg daily and losartan 25 mg daily along with amlodipine were all discontinued after the last admission.  Now he is tachycardic with a heart rate of 100.  According to the blood pressure and heart rate diary brought in by his wife, his heart rate has been persistently in the 100 range since his recent hospital release.  I will restart on Toprol-XL at lower dose 25 mg daily.  He also had AKI during the last admission likely related to diuresis, his losartan was discontinued on discharge.  I would likely restart him on ARB or Entresto on follow-up considering his LV dysfunction.  Otherwise he has been compliant with aspirin and Brilinta.  Despite the fact that his EKG continued to show ST elevation similar to his discharge EKG, however he denies any chest pain since the last admission.  His weight did increase by 5 pounds since yesterday, they had a lot of food  is due to Christmas, he can take additional 40 mg daily of Lasix as needed.  Otherwise he denies any lower extremity edema, orthopnea or PND.   Past Medical History:  Diagnosis Date  . CAD (coronary artery disease),residual non obstructive disease    a. 08/2012 Inferior STEMI: RCA 99% (3.5x32 Promus DES), nonobs LAD/LCX dzs;  b. 01/2013 MV: EF 66%, small inferoseptal artifact and possible small inferobasilar scar;  c. 03/2014 MV: fixed inf defect->diaph attenuation, no ischemia. 12/18 PCI/DES to mLAD, staged PCI/DES Lcx/OM  . DM (diabetes mellitus),  type 2 new diagnosis 08/05/2012   diet control  . ED (erectile dysfunction)    surgery planned  . History of echocardiogram    a. 08/2012 Echo: EF 55-60%, possible mild basal inferior HK, mildly dil RA, PASP 74mmHg.  [name] Hyperlipidemia 08/05/2012  . Hypertensive heart disease   . MI (myocardial infarction) ([place]) 08/04/2012   acute inferior stemi secondary to RCA occlusion; PCI  . Panic attack   . Pulmonary HTN ([place])    echo 08/05/12, EF 55-60%, PA pressure 61mm  . S/P coronary artery stent placement, to RCA Promus DES 08/05/2012  . Stroke [facility])    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  . Tobacco abuse     Past Surgical History:  Procedure Laterality Date  . CARDIAC CATHETERIZATION  08/04/12   PCI to RCA with DES  . CORONARY STENT INTERVENTION N/A 08/12/2017   Procedure: CORONARY STENT INTERVENTION;  Surgeon: Martinique, Peter M, MD;  Location: [facility] CV LAB;  Service: Cardiovascular;  Laterality: N/A;  . CORONARY/GRAFT ACUTE MI REVASCULARIZATION N/A 08/09/2017   Procedure: Coronary/Graft Acute MI Revascularization;  Surgeon: Belva Crome, MD;  Location: [facility] CV LAB;  Service: Cardiovascular;  Laterality: N/A;  . LEFT HEART CATH AND CORONARY ANGIOGRAPHY N/A 08/09/2017   Procedure: LEFT HEART CATH AND CORONARY ANGIOGRAPHY;  Surgeon: Belva Crome, MD;  Location: [place] CV LAB;  Service: Cardiovascular;  Laterality: N/A;  . LEFT HEART CATHETERIZATION WITH CORONARY ANGIOGRAM N/A 08/04/2012   Procedure: LEFT HEART CATHETERIZATION WITH CORONARY ANGIOGRAM;  Surgeon: Troy Sine, MD;  Location: [facility] CATH LAB;  Service: Cardiovascular;  Laterality: N/A;  . LEG SURGERY Left    rod placed for fracture repair  . PENILE PROSTHESIS IMPLANT N/A 06/03/2014   Procedure: IMPLANT PENILE PROTHESIS INFLATABLE;  Surgeon: Ailene Rud, MD;  Location: [facility] ORS;  Service: Urology;  Laterality: N/A;  with penile block--0.5% marcaine plain  . PERCUTANEOUS CORONARY STENT INTERVENTION (PCI-S) Right 08/04/2012    Procedure: PERCUTANEOUS CORONARY STENT INTERVENTION (PCI-S);  Surgeon: Troy Sine, MD;  Location: [facility] CATH LAB;  Service: Cardiovascular;  Laterality: Right;  . THUMB ARTHROSCOPY Left    thumb joint replaced    Current Medications: Outpatient Medications Prior to Visit  Medication Sig Dispense Refill  . albuterol (PROVENTIL HFA;VENTOLIN HFA) 108 (90 Base) MCG/ACT inhaler Inhale 2 puffs into the lungs every 6 (six) hours as needed for wheezing or shortness of breath. 1 Inhaler 2  . albuterol (PROVENTIL) (2.5 MG/3ML) 0.083% nebulizer solution Take 5 mg by nebulization every 4 (four) hours as needed. For wheezing or SOB    . allopurinol (ZYLOPRIM) 300 MG tablet Take 300 mg by mouth daily.    [name] aspirin 81 MG tablet Take 81 mg by mouth daily.    [name] atorvastatin (LIPITOR) 80 MG tablet Take 1 tablet (80 mg total) by mouth daily at 6 PM. 90 tablet 1  . clonazePAM (KLONOPIN) 0.5 MG tablet Take 1 mg  by mouth 2 (two) times daily.   0  . COLCRYS 0.6 MG tablet Take 0.6 mg by mouth daily.  3  . furosemide (LASIX) 40 MG tablet Take 1 tablet (40 mg total) by mouth daily. 30 tablet 11  . metFORMIN (GLUCOPHAGE-XR) 500 MG 24 hr tablet Take 500 mg by mouth daily.  3  . nitroGLYCERIN (NITROSTAT) 0.4 MG SL tablet Place 1 tablet (0.4 mg total) under the tongue every 5 (five) minutes x 3 doses as needed for chest pain. 25 tablet 2  . omega-3 acid ethyl esters (LOVAZA) 1 g capsule Take 2 capsules (2 g total) by mouth 2 (two) times daily. 180 capsule 1  . Omega-3 Fatty Acids (FISH OIL PO) Take 1 tablet by mouth daily.    [name] oxyCODONE-acetaminophen (PERCOCET) 10-325 MG per tablet Take 1 tablet by mouth 3 (three) times daily.     . potassium chloride (K-DUR) 10 MEQ tablet Take 1 tablet (10 mEq total) by mouth daily. 30 tablet 6  . sertraline (ZOLOFT) 50 MG tablet Take 50 mg by mouth daily.  3  . ticagrelor (BRILINTA) 90 MG TABS tablet Take 1 tablet (90 mg total) by mouth 2 (two) times daily. 60 tablet 10  .  Tiotropium Bromide-Olodaterol (STIOLTO RESPIMAT) 2.5-2.5 MCG/ACT AERS Inhale 2 puffs into the lungs daily.     No facility-administered medications prior to visit.      Allergies:   Hydrocodone   Social History   Socioeconomic History  . Marital status: Legally Separated    Spouse name: None  . Number of children: None  . Years of education: None  . Highest education level: None  Social Needs  . Financial resource strain: None  . Food insecurity - worry: None  . Food insecurity - inability: None  . Transportation needs - medical: None  . Transportation needs - non-medical: None  Occupational History  . None  Tobacco Use  . Smoking status: Current Every Day Smoker    Packs/day: 1.00    Years: 56.00    Pack years: 56.00    Types: Cigarettes  . Smokeless tobacco: Never Used  . Tobacco comment: smoking now 1 ppd  Substance and Sexual Activity  . Alcohol use: Yes    Comment: ocasionally  . Drug use: No  . Sexual activity: None  Other Topics Concern  . None  Social History Narrative  . None     Family History:  The patient's family history includes Heart attack in his paternal grandmother; Heart disease in his maternal grandmother; Kidney disease in his father; Stomach cancer in his maternal grandfather.   ROS:   Please see the history of present illness.    ROS All other systems reviewed and are negative.   PHYSICAL EXAM:   VS:  BP (!) 142/90   Pulse (!) 104   Ht 5\' 9"  (1.753 m)   Wt 257 lb (116.6 kg)   BMI 37.95 kg/m    GEN: Well nourished, well developed, in no acute distress  HEENT: normal  Neck: no JVD, carotid bruits, or masses Cardiac: RRR; no murmurs, rubs, or gallops,no edema  Respiratory:  clear to auscultation bilaterally, normal work of breathing GI: soft, nontender, nondistended, + BS MS: no deformity or atrophy  Skin: warm and dry, no rash Neuro:  Alert and Oriented x 3, Strength and sensation are intact Psych: euthymic mood, full affect  Wt  Readings from Last 3 Encounters:  08/28/17 257 lb (116.6 kg)  08/21/17 247 lb  3.2 oz (112.1 kg)  08/14/17 255 lb 11.2 oz (116 kg)      Studies/Labs Reviewed:   EKG:  EKG is ordered today.  The ekg ordered today demonstrates sinus tachycardia, right bundle branch block, inferior and anterior infarct  Recent Labs: 08/20/2017: ALT 44; B Natriuretic Peptide 654.8; Hemoglobin 14.5; Magnesium 2.0; Platelets 251; TSH 2.082 08/28/2017: BUN 35; Creatinine, Ser 1.91; Potassium 5.3; Sodium 139   Lipid Panel    Component Value Date/Time   CHOL 152 08/09/2017 1117   TRIG 259 (H) 08/09/2017 1117   HDL 32 (L) 08/09/2017 1117   CHOLHDL 4.8 08/09/2017 1117   VLDL 52 (H) 08/09/2017 1117   LDLCALC 68 08/09/2017 1117    Additional studies/ records that were reviewed today include:   Cath 08/09/2017 Conclusion    Acute anteroapical ST elevation myocardial infarction due to acute occlusion of the mid LAD at the second diagonal.  Diffusely diseased LAD with subtotal occlusion occurring in a longer segment of involvement  Left dominant coronary anatomy.  Widely patent left main coronary artery  Dominant circumflex with 99% mid vessel stenosis and 80% stenosis in a large first obtuse marginal branch  Widely patent nondominant right coronary previously stented without evidence of restenosis or obstruction.  Successful culprit PCI of the LAD using a 2 5 x 22 mm Onyx DES postdilated to 3 mm in diameter with TIMI grade III flow.  The second diagonal was jailed but maintained TIMI grade III flow.  There is mild to moderate disease proximal and distal to the stent.  TIMI grade III flow was noted.    RECOMMENDATIONS:   Staged PCI of OM1 and mid circumflex on Monday -needs to be scheduled.  Close follow-up with kidney function is a patient has stage III chronic kidney disease and received greater than 200 cc of contrast during the procedure.  IV nitroglycerin  Continue Brilinta 90 mg p.o. twice  daily  Aggressive lipid management.      Echo 08/09/2017 LV EF: 30% -   35%  Study Conclusions  - Left ventricle: The cavity size was normal. There was moderate   concentric hypertrophy. Systolic function was moderately to   severely reduced. The estimated ejection fraction was in the   range of 30% to 35%. Doppler parameters are consistent with   abnormal left ventricular relaxation (grade 1 diastolic   dysfunction). Doppler parameters are consistent with elevated   ventricular end-diastolic filling pressure. - Aortic valve: Trileaflet; normal thickness leaflets. There was no   regurgitation. - Mitral valve: There was no regurgitation. - Right atrium: The atrium was normal in size. - Tricuspid valve: There was mild regurgitation. - Pulmonary arteries: Systolic pressure was within the normal   range.  Impressions:  - When compared to the prior study from 06/19/2017 there is a   significant diference, LVEF has dropped from 55-60% to 30-35%.   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   There is sludge (blood layering and heavy swirling) as a sign of   pre-thrombotic state, but no definite thrombus is seen in the   apex.    Cath 08/12/2017 Conclusion     Previously placed Prox LAD to Mid LAD stent (unknown type) is widely patent.  Mid Cx lesion is 99% stenosed.  A drug-eluting stent was successfully placed using a STENT SIERRA 3.00 X 28 MM.  Post intervention, there  is a 0% residual stenosis.  Ost 1st Mrg to 1st Mrg lesion is 85% stenosed.  A drug-eluting stent was successfully placed using a STENT RESOLUTE ONYX 3.0X18.  Post intervention, there is a 0% residual stenosis.   1. Successful stenting of the mid LCx with a DES 2. Successful stenting of the first OM with DES  Plan: DAPT for at least one year. Hydrate. Anticipate DC in am if renal function  is stable and no complication.      ASSESSMENT:    1. Acute ST elevation myocardial infarction (STEMI) involving left anterior descending (LAD) coronary artery ([place])   2. Hypertensive heart disease with congestive heart failure, unspecified heart failure type ([place])   3. Encounter for long-term (current) use of medications   4. Coronary artery disease involving native coronary artery of native heart without angina pectoris   5. Hyperlipidemia, unspecified hyperlipidemia type      PLAN:  In order of problems listed above:  1. CAD: s/p recent anterior STEMI.  Had a significant infarct based on echocardiogram.  We have discussed the need to be compliant with dual antiplatelet therapy.  Despite this today's EKG continued to show mild ST elevation, he has not had any chest pain since he left the hospital.  I also reviewed today's EKG with my colleague, we both feel this is likely effusion no changes in the due to tachycardia.  2. Sinus tachycardia: Due to recent hypotension, he was taken off of beta-blocker and losartan.  He is to be on 100 mg daily of Toprol-XL.  Now he is tachycardic.  I have restarted Toprol-XL added 25 mg daily.  3. Ischemic cardiomyopathy/chronic systolic heart failure: EF 35%, losartan was taken off recently due to acute kidney injury after aggressive diuresis.  I plan to see the patient back in 3 weeks and likely restart losartan.  4. Acute renal insufficiency: Last creatinine 1.7, once renal function improved, I plan to restart losartan  5. Hypertension: Pressure mildly hypertensive also tachycardic, restart Toprol-XL at 25 mg daily  6. Hyperlipidemia: On Lipitor 80 mg daily.  Recent lipid panel showed very well controlled LDL, likely low HDL.  Triglyceride high.  Several addition of fenofibrate on follow-up.    Medication Adjustments/Labs and Tests Ordered: Current medicines are reviewed at length with the patient today.  Concerns regarding medicines are outlined  above.  Medication changes, Labs and Tests ordered today are listed in the Patient Instructions below. Patient Instructions  Medication Instructions:   START Toprol-XL 25mg  DAILY  No other changes to medications.  Labwork:   BMET today  Testing/Procedures:  none  Follow-Up:  With [name] PA in 3-4 weeks  With Dr. Claiborne Billings in 3-4 months  If you need a refill on your cardiac medications before your next appointment, please call your pharmacy.     Hilbert Corrigan, [place]  08/30/2017 9:52 AM    [address], [place], [place]  [address] Phone: [phone]; Fax: [phone]

## 2017-08-28 NOTE — Telephone Encounter (Signed)
New message      Checking the status on the medicaid reimbursement forms, have they been signed yet

## 2017-08-29 NOTE — Progress Notes (Signed)
Kidney function worsened slightly from hospital, recommend reduce lasix to 20mg  daily. Also potassium elevated, discontinue potassium supplement, recheck BMET in 1 week.

## 2017-08-30 ENCOUNTER — Other Ambulatory Visit: Payer: Self-pay | Admitting: *Deleted

## 2017-08-30 ENCOUNTER — Encounter: Payer: Self-pay | Admitting: Physician Assistant

## 2017-08-30 DIAGNOSIS — Z79899 Other long term (current) drug therapy: Secondary | ICD-10-CM

## 2017-08-30 MED ORDER — FUROSEMIDE 40 MG PO TABS: 20.0000 mg | ORAL_TABLET | Freq: Every day | ORAL | Status: DC

## 2017-09-05 ENCOUNTER — Telehealth: Payer: Self-pay | Admitting: Physician Assistant

## 2017-09-05 DIAGNOSIS — I2102 ST elevation (STEMI) myocardial infarction involving left anterior descending coronary artery: Secondary | ICD-10-CM

## 2017-09-05 NOTE — Telephone Encounter (Signed)
He needs cardiac rehab instead of PT. Reasonable for sleep study if he has not had sleep study in the past 2 years. Note last sleep study in 10/2012 did not meet criteria for split night.

## 2017-09-05 NOTE — Telephone Encounter (Signed)
New message  Pt sister Olegario Shearer verbalized that she is calling for the rn  Pt was referred to have physical therapy but sister is unsure   if it is home heath therapy or if he has to go to an outpatient rehab   She also want to know how long is he to go without driving.

## 2017-09-05 NOTE — Telephone Encounter (Signed)
REFERRAL PLACED FOR CARDIAC REHAB.   SPILT NIGHT SLEEP STUDY SCHEDULE FOR 09/24/17. OKAY TO DRIVE PER [facility].

## 2017-09-05 NOTE — Telephone Encounter (Signed)
Sister was inquiring  If patient needs home heatlh PT OR OUTPATIENT. When can he drive?   She also states patient - snores and think he has sleep apnea She thinks patient needs assistant at home.   RN  Informed sister , - with patien's dx. He will need cardaic rehab- 12 week program , not homehealth. Sister states hse not sure if patient will attend but would like for the referral to be placed. Patient having  PCI- CAN DRIVE @5  DAYS LATER WITH out issues  Split sleep study is RN  Informed sister that primary would handle home assistance. Sister states she has been trying to get in touch with primary and she will continue.  RN informed  Patient will contact her if any informations has changes from above after deferring  REHAB?, DRIVING?

## 2017-09-12 LAB — BASIC METABOLIC PANEL
BUN/Creatinine Ratio: 14 (ref 10–24)
BUN: 28 mg/dL — AB (ref 8–27)
CALCIUM: 9.8 mg/dL (ref 8.6–10.2)
CO2: 22 mmol/L (ref 20–29)
Chloride: 100 mmol/L (ref 96–106)
Creatinine, Ser: 1.95 mg/dL — ABNORMAL HIGH (ref 0.76–1.27)
GFR, EST AFRICAN AMERICAN: 39 mL/min/{1.73_m2} — AB (ref >59)
GFR, EST NON AFRICAN AMERICAN: 34 mL/min/{1.73_m2} — AB (ref >59)
Glucose: 116 mg/dL — ABNORMAL HIGH (ref 65–99)
Potassium: 4.4 mmol/L (ref 3.5–5.2)
Sodium: 139 mmol/L (ref 134–144)

## 2017-09-17 ENCOUNTER — Telehealth: Payer: Self-pay

## 2017-09-17 MED ORDER — FUROSEMIDE 40 MG PO TABS: 20.0000 mg | ORAL_TABLET | ORAL | Status: DC

## 2017-09-17 NOTE — Telephone Encounter (Signed)
Patient directly notified and asked that I contact his sister Armando Gang and let her know his med changes; she is on DPR and voiced understanding.

## 2017-09-17 NOTE — Telephone Encounter (Signed)
-----   Message from [place], [place] sent at 09/13/2017  4:15 PM EST ----- Kidney function still slightly lower than baseline, recommend change lasix to every other day with instruction of additional lasix on an as needed basis for increased swelling or SOB. I plan to obtain repeat BMET on followup

## 2017-09-18 ENCOUNTER — Telehealth: Payer: Self-pay

## 2017-09-18 ENCOUNTER — Telehealth (HOSPITAL_COMMUNITY): Payer: Self-pay

## 2017-09-18 NOTE — Telephone Encounter (Signed)
-----   Message from [place], [place] sent at 09/13/2017  4:15 PM EST ----- Kidney function still slightly lower than baseline, recommend change lasix to every other day with instruction of additional lasix on an as needed basis for increased swelling or SOB. I plan to obtain repeat BMET on followup

## 2017-09-18 NOTE — Telephone Encounter (Signed)
Called and spoke with patient in regards to cardiac rehab - patient stated he is not interested in the program. He has someone helping him. Closed referral.

## 2017-09-24 ENCOUNTER — Encounter (HOSPITAL_BASED_OUTPATIENT_CLINIC_OR_DEPARTMENT_OTHER): Payer: Medicare Other

## 2017-10-02 ENCOUNTER — Ambulatory Visit (INDEPENDENT_AMBULATORY_CARE_PROVIDER_SITE_OTHER): Payer: Medicare Other | Admitting: Physician Assistant

## 2017-10-02 ENCOUNTER — Encounter: Payer: Self-pay | Admitting: Physician Assistant

## 2017-10-02 VITALS — BP 124/76 | HR 83 | Ht 69.0 in | Wt 253.0 lb

## 2017-10-02 DIAGNOSIS — I5022 Chronic systolic (congestive) heart failure: Secondary | ICD-10-CM | POA: Diagnosis not present

## 2017-10-02 DIAGNOSIS — I2510 Atherosclerotic heart disease of native coronary artery without angina pectoris: Secondary | ICD-10-CM | POA: Diagnosis not present

## 2017-10-02 DIAGNOSIS — E785 Hyperlipidemia, unspecified: Secondary | ICD-10-CM

## 2017-10-02 DIAGNOSIS — I255 Ischemic cardiomyopathy: Secondary | ICD-10-CM | POA: Diagnosis not present

## 2017-10-02 DIAGNOSIS — N179 Acute kidney failure, unspecified: Secondary | ICD-10-CM | POA: Diagnosis not present

## 2017-10-02 MED ORDER — FUROSEMIDE 40 MG PO TABS: ORAL_TABLET | ORAL | Status: DC

## 2017-10-02 NOTE — Patient Instructions (Signed)
Medication Instructions:  DO NOT take Lasix Thursday and Friday  On Saturday start taking Lasix every other day  Labwork: Your physician recommends that you return for lab work in: Monday/Tuesday-BMET Your physician recommends that you return for lab work in: 2 months FASTING-Lipid  Testing/Procedures: None   Follow-Up: Keep upcoming appointment with Dr Claiborne Billings as scheduled   Any Other Special Instructions Will Be Listed Below (If Applicable). If you need a refill on your cardiac medications before your next appointment, please call your pharmacy.

## 2017-10-02 NOTE — Progress Notes (Signed)
Cardiology Office Note    Date:  10/02/2017   ID:  BRADAN [name], DOB Oct 04, 1945, MRN [id number]  PCP:  Hayden Rasmussen, MD  Cardiologist:  Dr. Claiborne Billings   Chief Complaint  Patient presents with  . Follow-up    3-4 weeks  . Shortness of Breath    History of Present Illness:  Bobby [name] is a 72 y.o. male with PMH of DM II, HTN, HLD, pulmonary HTN, remote small TIA/CVA and CAD.He had inferior MI in December 2013 and underwent DES to RCA. Echocardiogram done at the time showed EF 50-60% with mid to basal inferior probable scar in the moderate pulmonary hypertension with PA pressure 45 mmHg. Last Myoview obtained on 03/26/2014 was low risk showed normal LV function in the normal wall motion. Fixed inferior defect most consistent with diaphragmatic attenuation, no evidence of ischemia. Abdominal ultrasound in 2017 showed normal abdominal aorta. He was last seen by Dr.Kellyon 08/02/2016, hisBrilintawas transitioned to 60 mg twice a day.  I last saw the patient on 07/01/2017 for hospital follow-up after he presented with left arm pain.  Troponin was negative, echocardiogram in October showed EF 55-60%, mild LVH, grade 2 DD.  Given his atypical symptoms and a normal echocardiogram, no further workup was recommended.  He went to the hospital again on 08/09/2017 with anterior STEMI due to acute occlusion of mid LAD at the second diagonal.  He underwent successful PCI of LAD with 2.5 x 22 mm Onyx DES.  He was also noted to have a 99% dominant mid left circumflex lesion with 80% stenosis in large OM1.  He returned to the Cath Lab on 08/12/2017 and underwent DES to mid left circumflex and another DES to OM1.  Troponin peaked at greater than 65.  Echocardiogram obtained on 08/09/2017 showed EF 30-35%, new large aneurysm involving mid anterior septal, anterior and apical region consistent with infarct in LAD territory, Grade 1 DD.  He returned to the hospital on 08/20/2017 with malaise and dyspnea on  exertion.  EKG showed chronic left bundle branch block with heart rate of 48.  Beta-blocker was held due to bradycardia.  It was felt he had a component of acute on chronic systolic and diastolic heart failure and also COPD exacerbation.  He was diuresed with IV Lasix and was eventually discharged on 08/21/2017.  His discharge weight was 247 pounds.  He presents today for cardiology office visit.  He denies any significant chest pain, he does have some baseline dyspnea with heavy exertion.  Last lab also shows his creatinine is up to 1.9.  I recommended to hold his diuretic for 3 days before restarting this Saturday and decrease diuretic to every other day instead.  Once his renal function improved, I plan to add back losartan 25 mg daily for his LV dysfunction.  After reinitiation of Toprol-XL, his heart rate and blood pressure has improved.  There is no obvious sign of significant bradycardia.   Past Medical History:  Diagnosis Date  . CAD (coronary artery disease),residual non obstructive disease    a. 08/2012 Inferior STEMI: RCA 99% (3.5x32 Promus DES), nonobs LAD/LCX dzs;  b. 01/2013 MV: EF 66%, small inferoseptal artifact and possible small inferobasilar scar;  c. 03/2014 MV: fixed inf defect->diaph attenuation, no ischemia. 12/18 PCI/DES to mLAD, staged PCI/DES Lcx/OM  . DM (diabetes mellitus), type 2 new diagnosis 08/05/2012   diet control  . ED (erectile dysfunction)    surgery planned  . History of echocardiogram  a. 08/2012 Echo: EF 55-60%, possible mild basal inferior HK, mildly dil RA, PASP 65mmHg.  [name] Hyperlipidemia 08/05/2012  . Hypertensive heart disease   . MI (myocardial infarction) ([place]) 08/04/2012   acute inferior stemi secondary to RCA occlusion; PCI  . Panic attack   . Pulmonary HTN ([place])    echo 08/05/12, EF 55-60%, PA pressure 56mm  . S/P coronary artery stent placement, to RCA Promus DES 08/05/2012  . Stroke [facility])    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  . Tobacco abuse     Past  Surgical History:  Procedure Laterality Date  . CARDIAC CATHETERIZATION  08/04/12   PCI to RCA with DES  . CORONARY STENT INTERVENTION N/A 08/12/2017   Procedure: CORONARY STENT INTERVENTION;  Surgeon: Martinique, Peter M, MD;  Location: [place] [facility];  Service: Cardiovascular;  Laterality: N/A;  . CORONARY/GRAFT ACUTE MI REVASCULARIZATION N/A 08/09/2017   Procedure: Coronary/Graft Acute MI Revascularization;  Surgeon: Belva Crome, MD;  Location: [place] [facility];  Service: Cardiovascular;  Laterality: N/A;  . LEFT HEART CATH AND CORONARY ANGIOGRAPHY N/A 08/09/2017   Procedure: LEFT HEART CATH AND CORONARY ANGIOGRAPHY;  Surgeon: Belva Crome, MD;  Location: [place] [facility];  Service: Cardiovascular;  Laterality: N/A;  . LEFT HEART CATHETERIZATION WITH CORONARY ANGIOGRAM N/A 08/04/2012   Procedure: LEFT HEART CATHETERIZATION WITH CORONARY ANGIOGRAM;  Surgeon: Troy Sine, MD;  Location: [facility] CATH LAB;  Service: Cardiovascular;  Laterality: N/A;  . LEG SURGERY Left    rod placed for fracture repair  . PENILE PROSTHESIS IMPLANT N/A 06/03/2014   Procedure: IMPLANT PENILE PROTHESIS INFLATABLE;  Surgeon: Ailene Rud, MD;  Location: [facility] OR[facility];  Service: Urology;  Laterality: N/A;  with penile block--0.5% marcaine plain  . PERCUTANEOUS CORONARY STENT INTERVENTION (PCI-S) Right 08/04/2012   Procedure: PERCUTANEOUS CORONARY STENT INTERVENTION (PCI-S);  Surgeon: Troy Sine, MD;  Location: [facility] CATH LAB;  Service: Cardiovascular;  Laterality: Right;  . THUMB ARTHROSCOPY Left    thumb joint replaced    Current Medications: Outpatient Medications Prior to Visit  Medication Sig Dispense Refill  . albuterol (PROVENTIL HFA;VENTOLIN HFA) 108 (90 Base) MCG/ACT inhaler Inhale 2 puffs into the lungs every 6 (six) hours as needed for wheezing or shortness of breath. 1 Inhaler 2  . albuterol (PROVENTIL) (2.5 MG/3ML) 0.083% nebulizer solution Take 5 mg by nebulization every 4 (four) hours as needed.  For wheezing or SOB    . allopurinol (ZYLOPRIM) 300 MG tablet Take 300 mg by mouth daily.    [name] aspirin 81 MG tablet Take 81 mg by mouth daily.    [name] atorvastatin (LIPITOR) 80 MG tablet Take 1 tablet (80 mg total) by mouth daily at 6 PM. 90 tablet 1  . clonazePAM (KLONOPIN) 0.5 MG tablet Take 1 mg by mouth 2 (two) times daily.   0  . COLCRYS 0.6 MG tablet Take 0.6 mg by mouth daily.  3  . metFORMIN (GLUCOPHAGE-XR) 500 MG 24 hr tablet Take 500 mg by mouth daily.  3  . metoprolol succinate (TOPROL XL) 25 MG 24 hr tablet Take 1 tablet (25 mg total) by mouth daily. 30 tablet 4  . nitroGLYCERIN (NITROSTAT) 0.4 MG SL tablet Place 1 tablet (0.4 mg total) under the tongue every 5 (five) minutes x 3 doses as needed for chest pain. 25 tablet 2  . omega-3 acid ethyl esters (LOVAZA) 1 g capsule Take 2 capsules (2 g total) by mouth 2 (two) times daily. 180 capsule 1  .  Omega-3 Fatty Acids (FISH OIL PO) Take 1 tablet by mouth daily.    [name] oxyCODONE-acetaminophen (PERCOCET) 10-325 MG per tablet Take 1 tablet by mouth 3 (three) times daily.     . sertraline (ZOLOFT) 50 MG tablet Take 50 mg by mouth daily.  3  . ticagrelor (BRILINTA) 90 MG TABS tablet Take 1 tablet (90 mg total) by mouth 2 (two) times daily. 60 tablet 10  . Tiotropium Bromide-Olodaterol (STIOLTO RESPIMAT) 2.5-2.5 MCG/ACT AERS Inhale 2 puffs into the lungs daily.    . furosemide (LASIX) 40 MG tablet Take 0.5 tablets (20 mg total) by mouth every other day. 30 tablet    No facility-administered medications prior to visit.      Allergies:   Hydrocodone   Social History   Socioeconomic History  . Marital status: Legally Separated    Spouse name: Not on file  . Number of children: Not on file  . Years of education: Not on file  . Highest education level: Not on file  Social Needs  . Financial resource strain: Not on file  . Food insecurity - worry: Not on file  . Food insecurity - inability: Not on file  . Transportation needs - medical: Not  on file  . Transportation needs - non-medical: Not on file  Occupational History  . Not on file  Tobacco Use  . Smoking status: Current Every Day Smoker    Packs/day: 1.00    Years: 56.00    Pack years: 56.00    Types: Cigarettes  . Smokeless tobacco: Never Used  . Tobacco comment: smoking now 1 ppd  Substance and Sexual Activity  . Alcohol use: Yes    Comment: ocasionally  . Drug use: No  . Sexual activity: Not on file  Other Topics Concern  . Not on file  Social History Narrative  . Not on file     Family History:  The patient's family history includes Heart attack in his paternal grandmother; Heart disease in his maternal grandmother; Kidney disease in his father; Stomach cancer in his maternal grandfather.   ROS:   Please see the history of present illness.    ROS All other systems reviewed and are negative.   PHYSICAL EXAM:   VS:  BP 124/76   Pulse 83   Ht 5\' 9"  (1.753 m)   Wt 253 lb (114.8 kg)   BMI 37.36 kg/m    GEN: Well nourished, well developed, in no acute distress  HEENT: normal  Neck: no JVD, carotid bruits, or masses Cardiac: RRR; no murmurs, rubs, or gallops,no edema  Respiratory:  clear to auscultation bilaterally, normal work of breathing GI: soft, nontender, nondistended, + BS MS: no deformity or atrophy  Skin: warm and dry, no rash Neuro:  Alert and Oriented x 3, Strength and sensation are intact Psych: euthymic mood, full affect  Wt Readings from Last 3 Encounters:  10/02/17 253 lb (114.8 kg)  08/28/17 257 lb (116.6 kg)  08/21/17 247 lb 3.2 oz (112.1 kg)      Studies/Labs Reviewed:   EKG:  EKG is not ordered today.   Recent Labs: 08/20/2017: ALT 44; B Natriuretic Peptide 654.8; Hemoglobin 14.5; Magnesium 2.0; Platelets 251; TSH 2.082 09/12/2017: BUN 28; Creatinine, Ser 1.95; Potassium 4.4; Sodium 139   Lipid Panel    Component Value Date/Time   CHOL 152 08/09/2017 1117   TRIG 259 (H) 08/09/2017 1117   HDL 32 (L) 08/09/2017 1117    CHOLHDL 4.8 08/09/2017 1117  VLDL 52 (H) 08/09/2017 1117   LDLCALC 68 08/09/2017 1117    Additional studies/ records that were reviewed today include:   Cath 08/09/2017 Conclusion    Acute anteroapical ST elevation myocardial infarction due to acute occlusion of the mid LAD at the second diagonal. Diffusely diseased LAD with subtotal occlusion occurring in a longer segment of involvement  Left dominant coronary anatomy.  Widely patent left main coronary artery  Dominant circumflex with 99% mid vessel stenosis and 80% stenosis in a large first obtuse marginal branch  Widely patent nondominant right coronary previously stented without evidence of restenosis or obstruction.  Successful culprit PCI of the LAD using a 2 5 x 22 mm Onyx DES postdilated to 3 mm in diameter with TIMI grade III flow. The second diagonal was jailed but maintained TIMI grade III flow. There is mild to moderate disease proximal and distal to the stent. TIMI grade III flow was noted.   RECOMMENDATIONS:   Staged PCI of OM1 and mid circumflex on Monday -needs to be scheduled.  Close follow-up with kidney function is a patient has stage III chronic kidney disease and received greater than 200 cc of contrast during the procedure.  IV nitroglycerin  Continue Brilinta 90 mg p.o. twice daily  Aggressive lipid management.      Echo 08/09/2017 LV EF: 30% - 35%  Study Conclusions  - Left ventricle: The cavity size was normal. There was moderate concentric hypertrophy. Systolic function was moderately to severely reduced. The estimated ejection fraction was in the range of 30% to 35%. Doppler parameters are consistent with abnormal left ventricular relaxation (grade 1 diastolic dysfunction). Doppler parameters are consistent with elevated ventricular end-diastolic filling pressure. - Aortic valve: Trileaflet; normal thickness leaflets. There was no regurgitation. - Mitral  valve: There was no regurgitation. - Right atrium: The atrium was normal in size. - Tricuspid valve: There was mild regurgitation. - Pulmonary arteries: Systolic pressure was within the normal range.  Impressions:  - When compared to the prior study from 06/19/2017 there is a significant diference, LVEF has dropped from 55-60% to 30-35%. There is new large aneurysm involving the mid anteroseptal, anterior and all apical segments consistent with an infarct in the LAD territory. There is sludge (blood layering and heavy swirling) as a sign of pre-thrombotic state, but no definite thrombus is seen in the apex.    Cath 08/12/2017 Conclusion     Previously placed Prox LAD to Mid LAD stent (unknown type) is widely patent.  Mid Cx lesion is 99% stenosed.  A drug-eluting stent was successfully placed using a STENT SIERRA 3.00 X 28 MM.  Post intervention, there is a 0% residual stenosis.  Ost 1st Mrg to 1st Mrg lesion is 85% stenosed.  A drug-eluting stent was successfully placed using a STENT RESOLUTE ONYX 3.0X18.  Post intervention, there is a 0% residual stenosis.  1. Successful stenting of the mid LCx with a DES 2. Successful stenting of the first OM with DES  Plan: DAPT for at least one year. Hydrate. Anticipate DC in am if renal function is stable and no complication.      ASSESSMENT:    1. Coronary artery disease involving native coronary artery of native heart without angina pectoris   2. Hyperlipidemia, unspecified hyperlipidemia type   3. Acute kidney injury ([place])   4. Ischemic cardiomyopathy   5. Chronic systolic heart failure (HCC)      PLAN:  In order of problems listed above:  1. CAD: Status  post recent anterior MI, continue aspirin and Brilinta.  Still had a evolutionary changes on the EKG during the last office visit.  Has not had any further chest pain since.  Heart rate improved after reinitiating low-dose 25mg  Toprol-XL.   He used to be on 100 mg daily of Toprol-XL.  2. Ischemic cardiomyopathy: EF 35%.  Once renal function improved, plan to restart losartan for LV dysfunction  3. AKI: Last creatinine 1.9, I instructed the patient to hold the diuretic for 2 days and restarting this Saturday at every other day dosing.  Will need basic metabolic panel next Monday or Tuesday.  4. Hypertension: Blood pressure well controlled on low-dose Toprol-XL  5. Hyperlipidemia: On Lipitor 80 mg daily.  Last lipid panel showed very well controlled LDL, however triglyceride was high.  Will need fasting lipid panel in 2 months, if triglycerides remain high, consider addition of fenofibrate.    Medication Adjustments/Labs and Tests Ordered: Current medicines are reviewed at length with the patient today.  Concerns regarding medicines are outlined above.  Medication changes, Labs and Tests ordered today are listed in the Patient Instructions below. Patient Instructions  Medication Instructions:  DO NOT take Lasix Thursday and Friday  On Saturday start taking Lasix every other day  Labwork: Your physician recommends that you return for lab work in: Monday/Tuesday-BMET Your physician recommends that you return for lab work in: 2 months FASTING-Lipid  Testing/Procedures: None   Follow-Up: Keep upcoming appointment with Dr Claiborne Billings as scheduled   Any Other Special Instructions Will Be Listed Below (If Applicable). If you need a refill on your cardiac medications before your next appointment, please call your pharmacy.     Hilbert Corrigan, [place]  10/02/2017 1:09 PM    [facility] [address], [place], [place]  [address] Phone: [phone]; Fax: [phone]

## 2017-10-09 ENCOUNTER — Other Ambulatory Visit: Payer: Self-pay | Admitting: *Deleted

## 2017-10-09 NOTE — Telephone Encounter (Signed)
Hannah from [facility] left a msg on the refill vm requesting an rx for asa 81 mg on patients behalf.

## 2017-10-10 ENCOUNTER — Ambulatory Visit (HOSPITAL_BASED_OUTPATIENT_CLINIC_OR_DEPARTMENT_OTHER): Payer: Medicare Other | Attending: Cardiovascular Disease

## 2017-10-10 MED ORDER — ASPIRIN 81 MG PO TABS: 81.0000 mg | ORAL_TABLET | Freq: Every day | ORAL | 9 refills | Status: DC

## 2017-10-10 NOTE — Telephone Encounter (Signed)
Rx has been sent to the pharmacy electronically. ° °

## 2017-10-29 ENCOUNTER — Other Ambulatory Visit: Payer: Self-pay | Admitting: Family Medicine

## 2017-11-05 ENCOUNTER — Other Ambulatory Visit: Payer: Self-pay | Admitting: Family Medicine

## 2017-11-05 DIAGNOSIS — M5416 Radiculopathy, lumbar region: Secondary | ICD-10-CM

## 2017-11-25 ENCOUNTER — Other Ambulatory Visit: Payer: Self-pay | Admitting: Physician Assistant

## 2017-11-25 NOTE — Telephone Encounter (Signed)
Please review for refill. Thanks!  

## 2017-11-27 ENCOUNTER — Other Ambulatory Visit: Payer: Self-pay | Admitting: Physician Assistant

## 2017-11-27 NOTE — Telephone Encounter (Signed)
°*  STAT* If patient is at the pharmacy, call can be transferred to refill team.   1. Which medications need to be refilled? (please list name of each medication and dose if known) Colchicine 0.6 mg, Metformin ER 500 mg, Sertraline 50 mg and Wellbutrin XL 300 mg   2. Which pharmacy/location (including street and city if local pharmacy) is medication to be sent to?[facility]   3. Do they need a 30 day or 90 day supply?[place]

## 2017-11-28 NOTE — Telephone Encounter (Signed)
Lmvm at pharmacy to call PCP for refills-not cardiac medications

## 2017-12-10 ENCOUNTER — Ambulatory Visit: Payer: Medicare Other | Admitting: Cardiovascular Disease

## 2017-12-25 ENCOUNTER — Other Ambulatory Visit: Payer: Self-pay | Admitting: Cardiology

## 2017-12-25 NOTE — Telephone Encounter (Signed)
Rx sent to pharmacy   

## 2018-01-07 ENCOUNTER — Ambulatory Visit (INDEPENDENT_AMBULATORY_CARE_PROVIDER_SITE_OTHER): Payer: Medicare Other | Admitting: Cardiovascular Disease

## 2018-01-07 ENCOUNTER — Telehealth: Payer: Self-pay | Admitting: Cardiovascular Disease

## 2018-01-07 ENCOUNTER — Encounter: Payer: Self-pay | Admitting: Cardiovascular Disease

## 2018-01-07 VITALS — BP 104/64 | HR 88 | Ht 71.0 in | Wt 251.0 lb

## 2018-01-07 DIAGNOSIS — I5022 Chronic systolic (congestive) heart failure: Secondary | ICD-10-CM

## 2018-01-07 DIAGNOSIS — N184 Chronic kidney disease, stage 4 (severe): Secondary | ICD-10-CM | POA: Diagnosis not present

## 2018-01-07 DIAGNOSIS — I10 Essential (primary) hypertension: Secondary | ICD-10-CM

## 2018-01-07 DIAGNOSIS — J449 Chronic obstructive pulmonary disease, unspecified: Secondary | ICD-10-CM | POA: Diagnosis not present

## 2018-01-07 DIAGNOSIS — I255 Ischemic cardiomyopathy: Secondary | ICD-10-CM | POA: Diagnosis not present

## 2018-01-07 DIAGNOSIS — Z79899 Other long term (current) drug therapy: Secondary | ICD-10-CM

## 2018-01-07 DIAGNOSIS — E785 Hyperlipidemia, unspecified: Secondary | ICD-10-CM

## 2018-01-07 DIAGNOSIS — I25119 Atherosclerotic heart disease of native coronary artery with unspecified angina pectoris: Secondary | ICD-10-CM

## 2018-01-07 MED ORDER — FUROSEMIDE 20 MG PO TABS: 20.0000 mg | ORAL_TABLET | ORAL | Status: DC

## 2018-01-07 MED ORDER — FUROSEMIDE 40 MG PO TABS: 20.0000 mg | ORAL_TABLET | ORAL | Status: DC

## 2018-01-07 NOTE — Telephone Encounter (Signed)
With verbal approval from pt, spoke to pt 's girlfriend who states pt has been experiencing weakness in leg , sob with exertion, and dizziness x 2 weeks.  Girlfriend reports 2 days ago pt had a PB reading of 69/45. She states pt was out of it but still verbally responding. Today's BP reading 107/57 with HR 87. Pt is requesting to be seen. Appointment made for today with Dr. Claiborne Billings at 320pm

## 2018-01-07 NOTE — Patient Instructions (Signed)
Medication Instructions:  HOLD furosemide for 3 days --then take furosemide (Lasix) 20 mg every other day  Labwork: Please return for FASTING labs (CMET, CBC, Lipid, TSH, BNP)  Our in office lab hours are Monday-Friday 8:00-4:00, closed for lunch 12:45-1:45 pm.  No appointment needed.  Testing/Procedures: Your physician has requested that you have an echocardiogram. Echocardiography is a painless test that uses sound waves to create images of your heart. It provides your doctor with information about the size and shape of your heart and how well your heart's chambers and valves are working. This procedure takes approximately one hour. There are no restrictions for this procedure.  This will be done at our [facility] location:  [address]  Follow-Up: 6-8 weeks with Dr. Claiborne Billings  Any Other Special Instructions Will Be Listed Below (If Applicable).  Monitor BP at home and notify us if below 100 consistently   If you need a refill on your cardiac medications before your next appointment, please call your pharmacy.

## 2018-01-07 NOTE — Progress Notes (Addendum)
Patient ID: Bobby [name], male   DOB: [birth year], 72 y.o.   MRN: [id number]    HPI: Bobby [name] is a 72 y.o. male presents to the office today for an  18 month followup evaluation.   Mr. Tissue  has a long-standing tobacco history having started smoking at age 75, a history of hypertension, remote small TIA/CVA who presented to the hospital on 08/04/2012 in the setting of inferior ST segment elevation myocardial infarction. Catheterization by me revealed a 99% stenosis of the RCA and concomitant CAD involving his LAD and circumflex vessels. He underwent acute percutaneous coronary intervention with an excellent door to balloon time of only 20 minutes and ultimately insertion of a 3.25x32 mm DES stent post dilated 3.3. An echo done in the hospital showed an EF of 50-60% with mild basal inferior probable scar and moderate pulmonary hypertension with PA pressure of 45 mm.  When I saw [name] in May 2014, he did note shortness of breath with activity. He denied definitive chest pain. At that time, he realized that he had inadvertentlystopped taking the atorvastatin 40 mg dose and this was resumed. In addition, his Toprol dose was increased from 50 to 75 mg daily. A nuclear study showed an ejection fraction of 66%. Perfusion was essentially normal with exception of a small region of fixed inferoseptal bowel artifact and possible small area of inferobasilar scar.   [name] unfortunately continues to smoke at least a pack of cigarettes per day.   He does experience shortness of breath with activity.  He denies recent chest pain.  He has chronic right bundle branch block with repolarization changes.  He is diabetic and has pulmonary hypertension.  A follow-up nuclear perfusion study on 03/26/2014 remianed low risk and showed normal LV function and normal wall motion.  There was a fixed inferior defect that was felt most consistent with diaphragmatic attenuation.  There was no evidence for ischemia.  He  underwent prostate surgery by Dr. [name] without cardiovascular compromise.    A screening abdominal aortic ultrasound revealed a normal abdominal aorta.  Since I last saw him in November 2017, he has had an extensive history.  And has had recurrent hospitalizations he was hospitalized in October 2018 for left arm pain.  Troponin was negative.  In December 2018 he suffered an anterior ST segment elevation MI due to acute occlusion of his mid LAD and underwent successful PCI of his LAD with insertion of a 2.5 x 22 mm Resolute Onyx DES stent.  He was also found to have 99% dominant mid left circumflex lesion with 80% stenosis in the OM1 vessel and underwent staged intervention to the mid circumflex and marginal vessels.  Troponin was greater than 65.  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  He returned to the hospital August 20, 2017 with malaise and dyspnea.  He was bradycardic.  He was diuresed.  He has renal insufficiency with creatinines increasing up to 1.9.  He has been followed by [name] on numerous occasions in the office.  Unfortunately continues to smoke cigarettes.  He admits to 1/2 pack/day.  He does note occasional dizziness as well as some shortness of breath.  He denies recurrent anginal type symptoms.  He presents for evaluation.  Past Medical History:  Diagnosis Date  . CAD (coronary artery disease),residual non obstructive disease    a. 08/2012 Inferior STEMI: RCA 99% (3.5x32  Promus DES), nonobs LAD/LCX dzs;  b. 01/2013 MV: EF 66%, small inferoseptal artifact and possible small inferobasilar scar;  c. 03/2014 MV: fixed inf defect->diaph attenuation, no ischemia. 12/18 PCI/DES to mLAD, staged PCI/DES Lcx/OM  . DM (diabetes mellitus), type 2 new diagnosis 08/05/2012   diet control  . ED (erectile dysfunction)    surgery planned  . History of echocardiogram    a. 08/2012 Echo: EF 55-60%, possible mild basal  inferior HK, mildly dil RA, PASP 73mHg.  .Marland KitchenHyperlipidemia 08/05/2012  . Hypertensive heart disease   . [place]   acute inferior stemi secondary to RCA occlusion; PCI  . Panic attack   . Pulmonary HTN ([facility]    echo 08/05/12, EF 55-60%, PA pressure 481m . S/P coronary artery stent placement, to RCA Promus DES 08/05/2012  . [facility]   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  . Tobacco abuse     Past Surgical History:  Procedure Laterality Date  . CARDIAC CATHETERIZATION  08/04/12   PCI to RCA with DES  . CORONARY STENT INTERVENTION N/A 08/12/2017   Procedure: CORONARY STENT INTERVENTION;  Surgeon: JoMartiniquePeter M, MD;  [facility];  Service: Cardiovascular;  Laterality: N/A;  . CORONARY/GRAFT ACUTE MI REVASCULARIZATION N/A 08/09/2017   Procedure: Coronary/Graft Acute MI Revascularization;  Surgeon: SmBelva CromeMD;  [facility];  Service: Cardiovascular;  Laterality: N/A;  . LEFT HEART CATH AND CORONARY ANGIOGRAPHY N/A 08/09/2017   Procedure: LEFT HEART CATH AND CORONARY ANGIOGRAPHY;  Surgeon: SmBelva CromeMD;  [facility];  Service: Cardiovascular;  Laterality: N/A;  . LEFT HEART CATHETERIZATION WITH CORONARY ANGIOGRAM N/A 08/04/2012   Procedure: LEFT HEART CATHETERIZATION WITH CORONARY ANGIOGRAM;  Surgeon: ThTroy SineMD;  [facility];  Service: Cardiovascular;  Laterality: N/A;  . LEG SURGERY Left    rod placed for fracture repair  . PENILE PROSTHESIS IMPLANT N/A 06/03/2014   Procedure: IMPLANT PENILE PROTHESIS INFLATABLE;  Surgeon: SiAilene RudMD;  Location: [facility];  Service: Urology;  Laterality: N/A;  with penile block--0.5% marcaine plain  . PERCUTANEOUS CORONARY STENT INTERVENTION (PCI-S) Right 08/04/2012   Procedure: PERCUTANEOUS CORONARY STENT INTERVENTION (PCI-S);  Surgeon: ThTroy SineMD;  [facility];  Service: Cardiovascular;  Laterality: Right;  . THUMB  ARTHROSCOPY Left    thumb joint replaced    Allergies  Allergen Reactions  . Hydrocodone Itching    Current Outpatient Medications  Medication Sig Dispense Refill  . albuterol (PROVENTIL HFA;VENTOLIN HFA) 108 (90 Base) MCG/ACT inhaler Inhale 2 puffs into the lungs every 6 (six) hours as needed for wheezing or shortness of breath. 1 Inhaler 2  . albuterol (PROVENTIL) (2.5 MG/3ML) 0.083% nebulizer solution Take 5 mg by nebulization every 4 (four) hours as needed. For wheezing or SOB    . allopurinol (ZYLOPRIM) 300 MG tablet Take 300 mg by mouth daily.    . Marland Kitchenspirin 81 MG tablet Take 1 tablet (81 mg total) by mouth daily. 30 tablet 9  . atorvastatin (LIPITOR) 80 MG tablet Take 1 tablet (80 mg total) by mouth daily at 6 PM. Please call the office to make appointment for further refills. 60 tablet 0  . clonazePAM (KLONOPIN) 0.5 MG tablet Take 1 mg by mouth 2 (two) times daily.   0  . COLCRYS 0.6 MG tablet Take 0.6 mg by mouth daily.  3  . furosemide (LASIX) 20 MG tablet Take 1  tablet (20 mg total) by mouth every other day.    . metFORMIN (GLUCOPHAGE-XR) 500 MG 24 hr tablet Take 500 mg by mouth daily.  3  . metoprolol succinate (TOPROL-XL) 25 MG 24 hr tablet TAKE 1 TABLET BY MOUTH EVERY DAY 30 tablet 9  . nitroGLYCERIN (NITROSTAT) 0.4 MG SL tablet Place 1 tablet (0.4 mg total) under the tongue every 5 (five) minutes x 3 doses as needed for chest pain. 25 tablet 2  . omega-3 acid ethyl esters (LOVAZA) 1 g capsule Take 2 capsules (2 g total) by mouth 2 (two) times daily. 180 capsule 1  . Omega-3 Fatty Acids (FISH OIL PO) Take 1 tablet by mouth daily.    [name] oxyCODONE-acetaminophen (PERCOCET) 10-325 MG per tablet Take 1 tablet by mouth 3 (three) times daily.     . sertraline (ZOLOFT) 50 MG tablet Take 50 mg by mouth daily.  3  . ticagrelor (BRILINTA) 90 MG TABS tablet Take 1 tablet (90 mg total) by mouth 2 (two) times daily. 60 tablet 10  . Tiotropium Bromide-Olodaterol (STIOLTO RESPIMAT) 2.5-2.5  MCG/ACT AERS Inhale 2 puffs into the lungs daily.     No current facility-administered medications for this visit.     Socially,  he is divorced. He doesn't walk but not routinely exercise.  He continues to smoke one pack of cigarettes per day.  ROS General: Negative; No fevers, chills, or night sweats;  HEENT: Negative; No changes in vision or hearing, sinus congestion, difficulty swallowing Pulmonary:  Positive for shortness of breath; No cough, wheezing, , hemoptysis Cardiovascular: See history of present illness GI: Negative; No nausea, vomiting, diarrhea, or abdominal pain GU: Negative; No dysuria, hematuria, or difficulty voiding Musculoskeletal: Negative; no myalgias, joint pain, or weakness Hematologic/Oncology: Negative; no easy bruising, bleeding Endocrine: Positive for diabetes no heat/cold intolerance;  Neuro: Negative; no changes in balance, headaches Skin: Negative; No rashes or skin lesions Psychiatric: Negative; No behavioral problems, depression Sleep: Negative; No snoring, daytime sleepiness, hypersomnolence, bruxism, restless legs, hypnogognic hallucinations, no cataplexy Other comprehensive 14 point system review is negative.   PE BP 104/64   Pulse 88   Ht 5' 11"  (1.803 m)   Wt 251 lb (113.9 kg)   BMI 35.01 kg/m    Repeat blood pressure by me was 98/62 supine and 88/60 standing.  Wt Readings from Last 3 Encounters:  01/07/18 251 lb (113.9 kg)  10/02/17 253 lb (114.8 kg)  08/28/17 257 lb (116.6 kg)   General: Alert, oriented, no distress.  Skin: normal turgor, no rashes, warm and dry HEENT: Normocephalic, atraumatic. Pupils equal round and reactive to light; sclera anicteric; extraocular muscles intact; Fundi arteriolar narrowing.  No hemorrhages or exudate Nose without nasal septal hypertrophy Mouth/Parynx benign; Mallinpatti scale Neck: No JVD, no carotid bruits; normal carotid upstroke Lungs: clear to ausculatation and percussion; no wheezing or  rales Chest wall: without tenderness to palpitation Heart: PMI not displaced, RRR, s1 s2 normal, 1/6 systolic murmur, no diastolic murmur, no rubs, gallops, thrills, or heaves Abdomen: soft, nontender; no hepatosplenomehaly, BS+; abdominal aorta nontender and not dilated by palpation. Back: no CVA tenderness Pulses 2+ Musculoskeletal: full range of motion, normal strength, no joint deformities Extremities: no clubbing cyanosis or edema, Homan's sign negative  Neurologic: grossly nonfocal; Cranial nerves grossly wnl Psychologic: Normal mood and affect   ECG (independently read by me): Normal sinus rhythm at 88 bpm.  Right bundle branch block with repolarization changes.  Inferior Q waves.  T wave anteroseptally, possible anteroseptal  MI undetermined.  November 2017 ECG (independently read by me): Normal sinus rhythm at 64 bpm.  Branch block with repolarization changes.  QTc interval 460 ms.  June 2016 ECG (independently read by me): Sinus bradycardia at 57 bpm, right bundle branch block with repositioning changes.  QTc interval 459 ms.  September 2015 ECG (independently read by me and (: Normal sinus rhythm.  Right bundle branch block with repolarization changes.  Ventricular rate 81.  02/25/2014 ECG Normal sinus rhythm.  Right bundle branch block with repolarization changes.  PR interval 148 ms; QTc interval 463 ms  PriorECG: sinus rhythm at 76 beats per minute; right bundle-branch block with repolarization changes. PR interval 142 ms, QTC 481 ms.   LABS:  BMP Latest Ref Rng & Units 09/12/2017 08/28/2017 08/21/2017  Glucose 65 - 99 mg/dL 116(H) 134(H) 89  BUN 8 - 27 mg/dL 28(H) 35(H) 33(H)  Creatinine 0.76 - 1.27 mg/dL 1.95(H) 1.91(H) 1.72(H)  BUN/Creat Ratio 10 - 24 14 18  -  Sodium 134 - 144 mmol/L 139 139 135  Potassium 3.5 - 5.2 mmol/L 4.4 5.3(H) 4.1  Chloride 96 - 106 mmol/L 100 100 101  CO2 20 - 29 mmol/L 22 19(L) 21(L)  Calcium 8.6 - 10.2 mg/dL 9.8 10.9(H) 9.9   Hepatic  Function Latest Ref Rng & Units 08/20/2017 08/10/2017 08/02/2016  Total Protein 6.5 - 8.1 g/dL 6.9 6.1(L) 6.6  Albumin 3.5 - 5.0 g/dL 3.7 3.4(L) 3.9  AST 15 - 41 U/L 26 206(H) 13  ALT 17 - 63 U/L 44 53 14  Alk Phosphatase 38 - 126 U/L 106 80 83  Total Bilirubin 0.3 - 1.2 mg/dL 0.4 0.5 0.3   CBC Latest Ref Rng & Units 08/20/2017 08/20/2017 08/13/2017  WBC 4.0 - 10.5 K/uL 8.8 8.5 12.6(H)  Hemoglobin 13.0 - 17.0 g/dL 14.5 13.7 14.3  Hematocrit 39.0 - 52.0 % 43.6 41.4 42.2  Platelets 150 - 400 K/uL 251 245 150   Lab Results  Component Value Date   MCV 89.7 08/20/2017   MCV 90.0 08/20/2017   MCV 88.8 08/13/2017   Lab Results  Component Value Date   TSH 2.082 08/20/2017   Lipid Panel     Component Value Date/Time   CHOL 152 08/09/2017 1117   TRIG 259 (H) 08/09/2017 1117   HDL 32 (L) 08/09/2017 1117   CHOLHDL 4.8 08/09/2017 1117   VLDL 52 (H) 08/09/2017 1117   LDLCALC 68 08/09/2017 1117   RADIOLOGY: No results found.  IMPRESSION: 1. Coronary artery disease involving native coronary artery of native heart with angina pectoris ([place])   2. Chronic systolic heart failure ([place])   3. Essential hypertension   4. Medication management   5. Hyperlipidemia with target LDL less than 70   6. Ischemic cardiomyopathy   7. Chronic obstructive pulmonary disease, unspecified COPD type ([place])   8. CKD (chronic kidney disease) stage 4, GFR 15-29 ml/min ([facility])     ASSESSMENT AND PLAN:  Mr. Want is a 72 year old Caucasian male who suffered an acute coronary syndrome on 08/04/2012 when he presented with subtotal occlusion of his RCA. He had diffuse disease beyond the subtotal occlusion and ultimately had successful insertion of a 3.25x32 mm PROMUS DES stent. He had concomitant CAD with 40-50% proximal LAD narrowing, 30% circumflex marginal stenoses. NMR lipoprofile after initiation of atorvastatin in January 2014 showed marked improvement in total cholesterol of 119 LDL 48 LDL particle #992. HDL was  very low at 30 with an increased triglyceride at  203 and significantly reduced HDL particle #24.2. His insulin resistance score was elevated at 65.  I had not seen him since November 2017.  During that time, he had had several evaluations with extenders, and had had several trips [facility].  He suffered an anterior wall ST segment elevation MI requiring intervention to his LAD and due to high-grade concomitant CAD in his circumflex and marginal vessel required stenting to these vessels several days later.  Unfortunately continues to smoke cigarettes and is smoking 1/2 pack/day.  He has not had recent anginal symptomatology.  He admits to shortness of breath as well as dizziness.  His blood pressure today is low on recheck by me was 98/62 supine which dropped to 688/60 standing.  I have recommended that he hold his furosemide for the next 3 days.  He had previously been taking 40 mg daily.  After 3 days he will then decrease his furosemide to 20 mg and take this every other day.  He has continued to be on aspirin and Brilinta following his most recent STEMI.  He continues to be on Toprol-XL 25 mg daily.  He is now on atorvastatin 80 mg.  Target LDL is less than 70.  If LDL could not reach target, Zetia should be added and potential initiation of PCSK9 inhibition.  He is diabetic on metformin.  He has COPD and is on Stiolto Respimat in addition to albuterol.  I am recommending that he undergo a 2D echo Doppler study to reassess his systolic and diastolic function following his most recent coronary events.  A complete set of fasting laboratory will be obtained including BNP level to assess heart failure.  I will see him in 6 to 8 weeks for reevaluation.  Time spent: 40 minutes  Troy Sine, MD, [facility]  01/09/2018 7:59 PM

## 2018-01-07 NOTE — Telephone Encounter (Signed)
New message    Pt c/o BP issue: STAT if pt c/o blurred vision, one-sided weakness or slurred speech  1. What are your last 5 BP readings? Per Girlfriend   Yesterday 106/60, about  3 month 69/45 - no emergency room visit.  Today blood pressure was not check today  2. Are you having any other symptoms (ex. Dizziness, headache, blurred vision, passed out)? Per girlfriend - leg   3. What is your BP issue? Want to discuss with nurse  Please wait about  30 min before calling - have to wake patient up

## 2018-01-09 ENCOUNTER — Encounter: Payer: Self-pay | Admitting: Cardiovascular Disease

## 2018-01-14 LAB — COMPREHENSIVE METABOLIC PANEL
A/G RATIO: 1.8 (ref 1.2–2.2)
ALBUMIN: 4.2 g/dL (ref 3.5–4.8)
ALT: 30 IU/L (ref 0–44)
AST: 22 IU/L (ref 0–40)
Alkaline Phosphatase: 129 IU/L — ABNORMAL HIGH (ref 39–117)
BUN/Creatinine Ratio: 15 (ref 10–24)
BUN: 28 mg/dL — ABNORMAL HIGH (ref 8–27)
CHLORIDE: 104 mmol/L (ref 96–106)
CO2: 18 mmol/L — ABNORMAL LOW (ref 20–29)
Calcium: 10.2 mg/dL (ref 8.6–10.2)
Creatinine, Ser: 1.84 mg/dL — ABNORMAL HIGH (ref 0.76–1.27)
GFR calc Af Amer: 41 mL/min/{1.73_m2} — ABNORMAL LOW (ref >59)
GFR calc non Af Amer: 36 mL/min/{1.73_m2} — ABNORMAL LOW (ref >59)
GLOBULIN, TOTAL: 2.4 g/dL (ref 1.5–4.5)
GLUCOSE: 124 mg/dL — AB (ref 65–99)
POTASSIUM: 4.8 mmol/L (ref 3.5–5.2)
Sodium: 138 mmol/L (ref 134–144)
TOTAL PROTEIN: 6.6 g/dL (ref 6.0–8.5)

## 2018-01-14 LAB — CBC
HEMOGLOBIN: 13.5 g/dL (ref 13.0–17.7)
Hematocrit: 39.9 % (ref 37.5–51.0)
MCH: 29.6 pg (ref 26.6–33.0)
MCHC: 33.8 g/dL (ref 31.5–35.7)
MCV: 88 fL (ref 79–97)
PLATELETS: 218 10*3/uL (ref 150–379)
RBC: 4.56 x10E6/uL (ref 4.14–5.80)
RDW: 14.8 % (ref 12.3–15.4)
WBC: 8.4 10*3/uL (ref 3.4–10.8)

## 2018-01-14 LAB — LIPID PANEL
CHOLESTEROL TOTAL: 148 mg/dL (ref 100–199)
Chol/HDL Ratio: 4.1 ratio (ref 0.0–5.0)
HDL: 36 mg/dL — AB (ref >39)
LDL Calculated: 59 mg/dL (ref 0–99)
Triglycerides: 265 mg/dL — ABNORMAL HIGH (ref 0–149)
VLDL CHOLESTEROL CAL: 53 mg/dL — AB (ref 5–40)

## 2018-01-14 LAB — TSH: TSH: 1.4 u[IU]/mL (ref 0.450–4.500)

## 2018-01-14 LAB — BRAIN NATRIURETIC PEPTIDE: BNP: 65.7 pg/mL (ref 0.0–100.0)

## 2018-01-15 ENCOUNTER — Other Ambulatory Visit: Payer: Self-pay | Admitting: *Deleted

## 2018-01-15 DIAGNOSIS — Z79899 Other long term (current) drug therapy: Secondary | ICD-10-CM

## 2018-01-15 DIAGNOSIS — E785 Hyperlipidemia, unspecified: Secondary | ICD-10-CM

## 2018-01-20 ENCOUNTER — Ambulatory Visit (HOSPITAL_COMMUNITY): Payer: Medicare Other | Attending: Cardiology

## 2018-01-20 ENCOUNTER — Other Ambulatory Visit: Payer: Self-pay

## 2018-01-20 DIAGNOSIS — Z79899 Other long term (current) drug therapy: Secondary | ICD-10-CM | POA: Diagnosis not present

## 2018-01-20 DIAGNOSIS — Z720 Tobacco use: Secondary | ICD-10-CM | POA: Insufficient documentation

## 2018-01-20 DIAGNOSIS — I5022 Chronic systolic (congestive) heart failure: Secondary | ICD-10-CM | POA: Insufficient documentation

## 2018-01-20 DIAGNOSIS — I10 Essential (primary) hypertension: Secondary | ICD-10-CM

## 2018-01-20 DIAGNOSIS — I219 Acute myocardial infarction, unspecified: Secondary | ICD-10-CM | POA: Insufficient documentation

## 2018-01-20 DIAGNOSIS — N189 Chronic kidney disease, unspecified: Secondary | ICD-10-CM | POA: Insufficient documentation

## 2018-01-20 DIAGNOSIS — I129 Hypertensive chronic kidney disease with stage 1 through stage 4 chronic kidney disease, or unspecified chronic kidney disease: Secondary | ICD-10-CM | POA: Insufficient documentation

## 2018-01-20 DIAGNOSIS — G459 Transient cerebral ischemic attack, unspecified: Secondary | ICD-10-CM | POA: Insufficient documentation

## 2018-01-20 DIAGNOSIS — E1122 Type 2 diabetes mellitus with diabetic chronic kidney disease: Secondary | ICD-10-CM | POA: Insufficient documentation

## 2018-01-20 DIAGNOSIS — I119 Hypertensive heart disease without heart failure: Secondary | ICD-10-CM | POA: Diagnosis not present

## 2018-01-20 DIAGNOSIS — I25119 Atherosclerotic heart disease of native coronary artery with unspecified angina pectoris: Secondary | ICD-10-CM | POA: Insufficient documentation

## 2018-01-20 MED ORDER — PERFLUTREN LIPID MICROSPHERE
1.0000 mL | INTRAVENOUS | Status: AC | PRN
  Administered 2018-01-20: 1 mL via INTRAVENOUS

## 2018-01-24 ENCOUNTER — Encounter: Payer: Self-pay | Admitting: Cardiovascular Disease

## 2018-01-24 ENCOUNTER — Ambulatory Visit (INDEPENDENT_AMBULATORY_CARE_PROVIDER_SITE_OTHER): Payer: Medicare Other | Admitting: Cardiovascular Disease

## 2018-01-24 VITALS — BP 92/60 | HR 97 | Ht 71.0 in | Wt 248.0 lb

## 2018-01-24 DIAGNOSIS — J449 Chronic obstructive pulmonary disease, unspecified: Secondary | ICD-10-CM | POA: Diagnosis not present

## 2018-01-24 DIAGNOSIS — I25119 Atherosclerotic heart disease of native coronary artery with unspecified angina pectoris: Secondary | ICD-10-CM

## 2018-01-24 DIAGNOSIS — E782 Mixed hyperlipidemia: Secondary | ICD-10-CM

## 2018-01-24 DIAGNOSIS — I255 Ischemic cardiomyopathy: Secondary | ICD-10-CM | POA: Diagnosis not present

## 2018-01-24 DIAGNOSIS — Z7901 Long term (current) use of anticoagulants: Secondary | ICD-10-CM | POA: Diagnosis not present

## 2018-01-24 DIAGNOSIS — I513 Intracardiac thrombosis, not elsewhere classified: Secondary | ICD-10-CM | POA: Diagnosis not present

## 2018-01-24 DIAGNOSIS — N184 Chronic kidney disease, stage 4 (severe): Secondary | ICD-10-CM

## 2018-01-24 MED ORDER — CLOPIDOGREL BISULFATE 75 MG PO TABS: 75.0000 mg | ORAL_TABLET | Freq: Every day | ORAL | 3 refills | Status: DC

## 2018-01-24 MED ORDER — WARFARIN SODIUM 5 MG PO TABS: 5.0000 mg | ORAL_TABLET | Freq: Every day | ORAL | 0 refills | Status: DC

## 2018-01-24 MED ORDER — CARVEDILOL 6.25 MG PO TABS: ORAL_TABLET | ORAL | 3 refills | Status: DC

## 2018-01-24 NOTE — Patient Instructions (Addendum)
Medication Instructions:  Start warfarin (Coumadin) 5 mg once daily in the evening On Tuesday 5/28-take LAST dose of Brilinta and Aspirin Wednesday 5/29-Start Plavix 75 mg daily (STOP BRILINTA AND ASPIRIN)  Stop metoprolol Start carvedilol (Coreg) 3.125 mg (1/2 tablet) two times daily x 5 days --then increase to 6.25 mg (1 tablet) two times daily  Follow-Up: Wednesday 5/29 at 1:30 pm with pharmacist 1 month with Dr. Claiborne Billings  Any Other Special Instructions Will Be Listed Below (If Applicable).     If you need a refill on your cardiac medications before your next appointment, please call your pharmacy.

## 2018-01-24 NOTE — Progress Notes (Signed)
Patient ID: Bobby [name], male   DOB: July 16, 1946, 72 y.o.   MRN: [id number]    HPI: Bobby [name] is a 72 y.o. male presents to the office today for a 3 week followup evaluation.   Bobby [name]  has a long-standing tobacco history having started smoking at age 72, a history of hypertension, remote small TIA/CVA who presented to the hospital on 08/04/2012 in the setting of inferior ST segment elevation myocardial infarction. Catheterization by me revealed a 99% stenosis of the RCA and concomitant CAD involving his LAD and circumflex vessels. He underwent acute percutaneous coronary intervention with an excellent door to balloon time of only 20 minutes and ultimately insertion of a 3.25x32 mm DES stent post dilated 3.3. An echo done in the hospital showed an EF of 50-60% with mild basal inferior probable scar and moderate pulmonary hypertension with PA pressure of 45 mm.  When I saw Bobby [name] in May 2014, he did note shortness of breath with activity. He denied definitive chest pain. At that time, he realized that he had inadvertentlystopped taking the atorvastatin 40 mg dose and this was resumed. In addition, his Toprol dose was increased from 50 to 75 mg daily. A nuclear study showed an ejection fraction of 66%. Perfusion was essentially normal with exception of a small region of fixed inferoseptal bowel artifact and possible small area of inferobasilar scar.   Bobby [name] unfortunately continues to smoke at least a pack of cigarettes per day.   He does experience shortness of breath with activity.  He denies recent chest pain.  He has chronic right bundle branch block with repolarization changes.  He is diabetic and has pulmonary hypertension.  A follow-up nuclear perfusion study on 03/26/2014 remianed low risk and showed normal LV function and normal wall motion.  There was a fixed inferior defect that was felt most consistent with diaphragmatic attenuation.  There was no evidence for ischemia.  He underwent  prostate surgery by Dr. [name] without cardiovascular compromise.    A screening abdominal aortic ultrasound revealed a normal abdominal aorta.  Since I last saw him in November 2017, he has had an extensive history.  And has had recurrent hospitalizations he was hospitalized in October 2018 for left arm pain.  Troponin was negative.  In December 2018 he suffered an anterior ST segment elevation MI due to acute occlusion of his mid LAD and underwent successful PCI of his LAD with insertion of a 2.5 x 22 mm Resolute Onyx DES stent.  He was also found to have 99% dominant mid left circumflex lesion with 80% stenosis in the OM1 vessel and underwent staged intervention to the mid circumflex and marginal vessels.  Troponin was greater than 65.  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  There was mention of sludge is a sign of pre-thrombotic state but no definite thrombus was seen at the apex.  He returned to the hospital August 20, 2017 with malaise and dyspnea.  He was bradycardic.  He was diuresed.  He has renal insufficiency with creatinines increasing up to 1.9.  He has been followed by [name] on numerous occasions in the office.  Unfortunately continues to smoke cigarettes.  He admits to 1/2 pack/day.  He does note occasional dizziness as well as some shortness of breath.  He denies recurrent anginal type symptoms.   Suggestive of low flow state. I saw him for  evaluation on Jan 07, 2018.  At that time, I recommended that he undergo a follow-up echo Doppler study to reassess systolic and diastolic function as well as potential for apical thrombus formation.  He had continued to be on aspirin and Brilinta following his STEMI.  He was on atorvastatin 80 mg for hyperlipidemia with target less than 70.  I had a long discussion regarding smoking cessation.  He underwent a follow-up echo Doppler study on Jan 20, 2018.  This showed reduced  LV function with an EF of 35 to 40% with akinesis of the mid apical anterolateral and septal as well as apical walls.  There was grade 1 diastolic dysfunction.  There was now a possible small layered thrombus at the apex noted using Definity and also evidence of swirling at the LV apex suggestive of low flow state.  Based on these results, notify the patient to arrange for follow-up office visit for further discussion and probable initiation of anticoagulation therapy. He presents for evaluation.    Past Medical History:  Diagnosis Date  . CAD (coronary artery disease),residual non obstructive disease    a. 08/2012 Inferior STEMI: RCA 99% (3.5x32 Promus DES), nonobs LAD/LCX dzs;  b. 01/2013 MV: EF 66%, small inferoseptal artifact and possible small inferobasilar scar;  c. 03/2014 MV: fixed inf defect->diaph attenuation, no ischemia. 12/18 PCI/DES to mLAD, staged PCI/DES Lcx/OM  . DM (diabetes mellitus), type 2 new diagnosis 08/05/2012   diet control  . ED (erectile dysfunction)    surgery planned  . History of echocardiogram    a. 08/2012 Echo: EF 55-60%, possible mild basal inferior HK, mildly dil RA, PASP 72mHg.  .Marland KitchenHyperlipidemia 08/05/2012  . Hypertensive heart disease   . [place]) ([facility] [place]   acute inferior stemi secondary to RCA occlusion; PCI  . Panic attack   . [place] ([place]    echo 08/05/12, EF 55-60%, PA pressure 491m . S/P coronary artery stent placement, to RCA Promus DES 08/05/2012  . [facility]   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  . Tobacco abuse     Past Surgical History:  Procedure Laterality Date  . CARDIAC CATHETERIZATION  08/04/12   PCI to RCA with DES  . CORONARY STENT INTERVENTION N/A 08/12/2017   Procedure: CORONARY STENT INTERVENTION;  Surgeon: JoMartiniquePeter M, MD;  [facility];  Service: Cardiovascular;  Laterality: N/A;  . CORONARY/GRAFT ACUTE MI REVASCULARIZATION N/A 08/09/2017   Procedure: Coronary/Graft Acute MI  Revascularization;  Surgeon: SmBelva CromeMD;  [facility];  Service: Cardiovascular;  Laterality: N/A;  . LEFT HEART CATH AND CORONARY ANGIOGRAPHY N/A 08/09/2017   Procedure: LEFT HEART CATH AND CORONARY ANGIOGRAPHY;  Surgeon: SmBelva CromeMD;  [place] [facility];  Service: Cardiovascular;  Laterality: N/A;  . LEFT HEART CATHETERIZATION WITH CORONARY ANGIOGRAM N/A 08/04/2012   Procedure: LEFT HEART CATHETERIZATION WITH CORONARY ANGIOGRAM;  Surgeon: ThTroy SineMD;  [facility];  Service: Cardiovascular;  Laterality: N/A;  . LEG SURGERY Left    rod placed for fracture repair  . PENILE PROSTHESIS IMPLANT N/A 06/03/2014   Procedure: IMPLANT PENILE PROTHESIS INFLATABLE;  Surgeon: SiAilene RudMD;  Location: [facility];  Service: Urology;  Laterality: N/A;  with penile block--0.5% marcaine plain  . PERCUTANEOUS CORONARY STENT INTERVENTION (PCI-S) Right 08/04/2012   Procedure: PERCUTANEOUS CORONARY STENT INTERVENTION (PCI-S);  Surgeon: ThTroy SineMD;  [facility];  Service: Cardiovascular;  Laterality:  Right;  [name] THUMB ARTHROSCOPY Left    thumb joint replaced    Allergies  Allergen Reactions  . Hydrocodone Itching    Current Outpatient Medications  Medication Sig Dispense Refill  . albuterol (PROVENTIL HFA;VENTOLIN HFA) 108 (90 Base) MCG/ACT inhaler Inhale 2 puffs into the lungs every 6 (six) hours as needed for wheezing or shortness of breath. 1 Inhaler 2  . albuterol (PROVENTIL) (2.5 MG/3ML) 0.083% nebulizer solution Take 5 mg by nebulization every 4 (four) hours as needed. For wheezing or SOB    . allopurinol (ZYLOPRIM) 300 MG tablet Take 300 mg by mouth daily.    [name] atorvastatin (LIPITOR) 80 MG tablet Take 1 tablet (80 mg total) by mouth daily at 6 PM. Please call the office to make appointment for further refills. 60 tablet 0  . clonazePAM (KLONOPIN) 0.5 MG tablet Take 1 mg by mouth 2 (two) times daily.   0  . COLCRYS 0.6 MG  tablet Take 0.6 mg by mouth daily.  3  . furosemide (LASIX) 20 MG tablet Take 1 tablet (20 mg total) by mouth every other day.    . metFORMIN (GLUCOPHAGE-XR) 500 MG 24 hr tablet Take 500 mg by mouth daily.  3  . nitroGLYCERIN (NITROSTAT) 0.4 MG SL tablet Place 1 tablet (0.4 mg total) under the tongue every 5 (five) minutes x 3 doses as needed for chest pain. 25 tablet 2  . omega-3 acid ethyl esters (LOVAZA) 1 g capsule Take 2 capsules (2 g total) by mouth 2 (two) times daily. 180 capsule 1  . Omega-3 Fatty Acids (FISH OIL PO) Take 1 tablet by mouth daily.    [name] oxyCODONE-acetaminophen (PERCOCET) 10-325 MG per tablet Take 1 tablet by mouth 3 (three) times daily.     . sertraline (ZOLOFT) 50 MG tablet Take 50 mg by mouth daily.  3  . Tiotropium Bromide-Olodaterol (STIOLTO RESPIMAT) 2.5-2.5 MCG/ACT AERS Inhale 2 puffs into the lungs daily.     No current facility-administered medications for this visit.     Socially,  he is divorced. He doesn't walk but not routinely exercise.  He continues to smoke one pack of cigarettes per day.  ROS General: Negative; No fevers, chills, or night sweats;  HEENT: Negative; No changes in vision or hearing, sinus congestion, difficulty swallowing Pulmonary:  Positive for shortness of breath; No cough, wheezing, , hemoptysis Cardiovascular: See history of present illness GI: Negative; No nausea, vomiting, diarrhea, or abdominal pain GU: Negative; No dysuria, hematuria, or difficulty voiding Musculoskeletal: Negative; no myalgias, joint pain, or weakness Hematologic/Oncology: Negative; no easy bruising, bleeding Endocrine: Positive for diabetes no heat/cold intolerance;  Neuro: Negative; no changes in balance, headaches Skin: Negative; No rashes or skin lesions Psychiatric: Negative; No behavioral problems, depression Sleep: Negative; No snoring, daytime sleepiness, hypersomnolence, bruxism, restless legs, hypnogognic hallucinations, no cataplexy Other  comprehensive 14 point system review is negative.   PE BP 92/60   Pulse 97   Ht 5' 11"  (1.803 m)   Wt 248 lb (112.5 kg)   BMI 34.59 kg/m    Repeat blood pressure was 96/68.  Wt Readings from Last 3 Encounters:  01/24/18 248 lb (112.5 kg)  01/07/18 251 lb (113.9 kg)  10/02/17 253 lb (114.8 kg)   General: Alert, oriented, no distress.  Skin: normal turgor, no rashes, warm and dry HEENT: Normocephalic, atraumatic. Pupils equal round and reactive to light; sclera anicteric; extraocular muscles intact;  Nose without nasal septal hypertrophy Mouth/Parynx benign; Mallinpatti scale Neck: No JVD,  no carotid bruits; normal carotid upstroke Lungs: clear to ausculatation and percussion; no wheezing or rales Chest wall: without tenderness to palpitation Heart: PMI not displaced, RRR, s1 s2 normal, 1/6 systolic murmur, no diastolic murmur, no rubs, gallops, thrills, or heaves Abdomen: soft, nontender; no hepatosplenomehaly, BS+; abdominal aorta nontender and not dilated by palpation. Back: no CVA tenderness Pulses 2+ Musculoskeletal: full range of motion, normal strength, no joint deformities Extremities: no clubbing cyanosis or edema, Homan's sign negative  Neurologic: grossly nonfocal; Cranial nerves grossly wnl Psychologic: Normal mood and affect   ECG (independently read by me): Sinus rhythm at 97 bpm.  Right bundle branch block with repolarization changes.  QTc interval 472 ms.  Anterolateral Q waves  Jan 07, 2018 ECG (independently read by me): Normal sinus rhythm at 88 bpm.  Right bundle branch block with repolarization changes.  Inferior Q waves.  T wave anteroseptally, possible anteroseptal MI undetermined.  November 2017 ECG (independently read by me): Normal sinus rhythm at 64 bpm.  Branch block with repolarization changes.  QTc interval 460 ms.  June 2016 ECG (independently read by me): Sinus bradycardia at 57 bpm, right bundle branch block with repositioning changes.  QTc  interval 459 ms.  September 2015 ECG (independently read by me and (: Normal sinus rhythm.  Right bundle branch block with repolarization changes.  Ventricular rate 81.  02/25/2014 ECG Normal sinus rhythm.  Right bundle branch block with repolarization changes.  PR interval 148 ms; QTc interval 463 ms  PriorECG: sinus rhythm at 76 beats per minute; right bundle-branch block with repolarization changes. PR interval 142 ms, QTC 481 ms.   LABS:  BMP Latest Ref Rng & Units 01/13/2018 09/12/2017 08/28/2017  Glucose 65 - 99 mg/dL 124(H) 116(H) 134(H)  BUN 8 - 27 mg/dL 28(H) 28(H) 35(H)  Creatinine 0.76 - 1.27 mg/dL 1.84(H) 1.95(H) 1.91(H)  BUN/Creat Ratio 10 - 24 15 14 18   Sodium 134 - 144 mmol/L 138 139 139  Potassium 3.5 - 5.2 mmol/L 4.8 4.4 5.3(H)  Chloride 96 - 106 mmol/L 104 100 100  CO2 20 - 29 mmol/L 18(L) 22 19(L)  Calcium 8.6 - 10.2 mg/dL 10.2 9.8 10.9(H)   Hepatic Function Latest Ref Rng & Units 01/13/2018 08/20/2017 08/10/2017  Total Protein 6.0 - 8.5 g/dL 6.6 6.9 6.1(L)  Albumin 3.5 - 4.8 g/dL 4.2 3.7 3.4(L)  AST 0 - 40 IU/L 22 26 206(H)  ALT 0 - 44 IU/L 30 44 53  Alk Phosphatase 39 - 117 IU/L 129(H) 106 80  Total Bilirubin 0.0 - 1.2 mg/dL <0.2 0.4 0.5   CBC Latest Ref Rng & Units 01/13/2018 08/20/2017 08/20/2017  WBC 3.4 - 10.8 x10E3/uL 8.4 8.8 8.5  Hemoglobin 13.0 - 17.7 g/dL 13.5 14.5 13.7  Hematocrit 37.5 - 51.0 % 39.9 43.6 41.4  Platelets 150 - 379 x10E3/uL 218 251 245   Lab Results  Component Value Date   MCV 88 01/13/2018   MCV 89.7 08/20/2017   MCV 90.0 08/20/2017   Lab Results  Component Value Date   TSH 1.400 01/13/2018   Lipid Panel     Component Value Date/Time   CHOL 148 01/13/2018 0839   TRIG 265 (H) 01/13/2018 0839   HDL 36 (L) 01/13/2018 0839   CHOLHDL 4.1 01/13/2018 0839   CHOLHDL 4.8 08/09/2017 1117   VLDL 52 (H) 08/09/2017 1117   LDLCALC 59 01/13/2018 0839   RADIOLOGY: No results found.  IMPRESSION: No diagnosis found.  ASSESSMENT AND  PLAN:  Bobby [name]  is a 72 year old Caucasian male who suffered an acute coronary syndrome on 08/04/2012 when he presented with subtotal occlusion of his RCA. He had diffuse disease beyond the subtotal occlusion and ultimately had successful insertion of a 3.25x32 mm PROMUS DES stent. He had concomitant CAD with 40-50% proximal LAD narrowing, 30% circumflex marginal stenoses. NMR lipoprofile after initiation of atorvastatin in January 2014 showed marked improvement in total cholesterol of 119 LDL 48 LDL particle #992. HDL was very low at 30 with an increased triglyceride at 203 and significantly reduced HDL particle #24.2. His insulin resistance score was elevated at 65.  Prior to his evaluation with me on August 09, 2018, I had not seen him since November 2017.  I had reviewed his numerous evaluations with extenders several trips to [facility].  He had suffered an anterior STEMI in December 2018 and underwent successful stenting and several days later he required staged intervention to circumflex and marginal vessel.  His initial echo Doppler study in December raise concerns for potential pre-thrombotic state.  His most recent echo Doppler study now is highly suggestive of apical thrombus.  I had a long discussion with him today.  He will need to initiate anticoagulation therapy.  Since this is an apical thrombus and not atrial fibrillation, I will initiate warfarin therapy.  After 3 days, he will discontinue Brilinta and switch to Plavix and discontinue aspirin at that time such that he will maintain on Plavix and warfarin.  He is 6 months status post DES stenting.  With his reduced LV function, I will also change him from metoprolol to carvedilol and he will initiate 3.125 mg twice a day for the next 5 days and then titrate this to 6.25 mg twice a day.  With his low blood pressure, at present he is not a candidate for Entresto or ACE or arm therapy.  I reviewed recent laboratory.  TSH was normal at 1.4.  He has  renal insufficiency with a creatinine of 1.84 which was slightly improved from 1.95.  BNP was 65.  Total cholesterol was 148 with an LDL of 59, but he had an atherogenic dyslipidemic pattern with continued elevation of triglycerides of 265, increased VLDL, and low HDL.  He continues to be on atorvastatin 80 mg, low vase a 2 capsules twice a day, we discussed improved diet and marked reduction in carbohydrates and sweets.  He is diabetic on metformin.  Again discussed smoking cessation.  He has COPD and is on Stiolto Respimat in addition to albuterol.  He will have a pro time check in 5 days and dose adjustment will be necessary to maintain therapeutic INR.  Time spent: 25 minutes  Troy Sine, MD, [facility]  01/24/2018 1:44 PM

## 2018-01-26 ENCOUNTER — Encounter: Payer: Self-pay | Admitting: Cardiovascular Disease

## 2018-01-28 NOTE — Addendum Note (Signed)
Addended by: Therisa Doyne on: 01/28/2018 03:13 PM   Modules accepted: Orders

## 2018-01-29 ENCOUNTER — Ambulatory Visit (INDEPENDENT_AMBULATORY_CARE_PROVIDER_SITE_OTHER): Payer: Medicare Other | Admitting: Pharmacist Clinician (PhC)/ Clinical Pharmacy Specialist

## 2018-01-29 DIAGNOSIS — I513 Intracardiac thrombosis, not elsewhere classified: Secondary | ICD-10-CM | POA: Diagnosis not present

## 2018-01-29 DIAGNOSIS — Z8673 Personal history of transient ischemic attack (TIA), and cerebral infarction without residual deficits: Secondary | ICD-10-CM | POA: Diagnosis not present

## 2018-01-29 DIAGNOSIS — Z7901 Long term (current) use of anticoagulants: Secondary | ICD-10-CM | POA: Diagnosis not present

## 2018-01-29 LAB — POCT INR: INR: 1 — AB (ref 2.0–3.0)

## 2018-01-29 NOTE — Patient Instructions (Addendum)
Questions or concerns please call [facility] ([facility]) at [phone]    What You Need to Know About Warfarin Warfarin is a blood thinner (anticoagulant). Anticoagulants help to prevent the formation of blood clots. They also help to stop the growth of blood clots. Who should use warfarin? Warfarin is prescribed for people who are at risk for developing harmful blood clots, such as people who have:  Surgically implanted mechanical heart valves.  Irregular heart rhythms (atrial fibrillation).  Certain clotting disorders.  A history of harmful blood clotting in the past. This includes people who have had: ? A stroke. ? Blood clot in the lungs (pulmonary embolism, or PE). ? Blood clot in the legs (deep vein thrombosis, or DVT).  An existing blood clot.  How is warfarin taken?  Warfarin is a medicine that you take by mouth (orally). Warfarin tablets come in different strengths. Each tablet strength is a different color, with the amount of warfarin printed on the tablet. If you get a new prescription filled and the color of your tablet is different than usual, tell your pharmacist or health care provider immediately. What blood tests do I need while taking warfarin? The goal of warfarin therapy is to lessen the clotting tendency of blood, but not to prevent clotting completely. Your health care provider will monitor the anticoagulation effect of warfarin closely and will adjust your dose as needed. Warfarin is a medicine that needs to be closely monitored, so it is very important to keep all lab visits and follow-up visits with your health care provider. While taking warfarin, you will need to have blood tests (prothrombin tests, or PT tests) regularly to measure your blood clotting time. This type of test can be done with a finger stick or a blood draw. What does the INR test result mean? The PT test results will be reported as the International Normalized Ratio (INR). The INR  tells your health care provider whether your dosage of warfarin needs to be changed. The longer it takes your blood to clot, the higher the INR. Your health care provider will tell you your target INR range. If your INR is not in your target range, your health care provider may adjust your dosage.  If your INR is above your target range, there is a risk of bleeding. Your dosage of warfarin may need to be decreased.  If your INR is below your target range, there is a risk of clotting. Your dosage of warfarin may need to be increased.  How often is the INR test needed?  When you first start warfarin, you will usually have your INR checked every few days.  You may need to have INR tests done more than once a week until you are taking the correct dosage of warfarin.  After you have reached your target INR, your INR will be tested less often. However, you will need to have your INR checked at least once every 4-6 weeks for the entire time you are taking warfarin. What are the side effects of warfarin? Too much warfarin can cause bleeding (hemorrhage) in any part of the body, such as:  Bleeding from the gums.  Unexplained bruises.  Bruises that get larger.  Blood in the urine.  Bloody or dark stools.  Bleeding in the brain (hemorrhagic stroke).  A nosebleed that is not easily stopped.  Coughing up blood.  Vomiting blood.  Warfarin use may also cause:  Skin rash or irritations  Nausea that does not go away.  Severe pain in the back or joints.  Painful toes that turn blue or purple (purple toe syndrome).  Painful ulcers that do not go away (skin necrosis).  What are the signs and symptoms of a blood clot? Too little warfarin can increase the risk of blood clots in your legs, lungs, or arms. Signs and symptoms of a DVT in your leg or arm may include:  Pain or swelling in your leg or arm.  Skin that is red or warm to the touch on your arm or leg.  Signs and symptoms of a  pulmonary embolism may include:  Shortness of breath or difficulty breathing.  Chest pain.  Unexplained fever.  What are the signs and symptoms of a stroke? If you are taking too much or too little warfarin, you can have a stroke. Signs and symptoms of a stroke may include:  Weakness or numbness of your face, arm, or leg, especially on one side of your body.  Confusion or trouble thinking clearly.  Difficulty seeing with one or both eyes.  Difficulty walking or moving your arms or legs.  Dizziness.  Loss of balance or coordination.  Trouble speaking, trouble understanding speech, or both (aphasia).  Sudden, severe headache with no known cause.  Partial or total loss of consciousness.  What precautions do I need to take while using warfarin?   Take warfarin exactly as told by your health care provider. Doing this helps you avoid bleeding or blood clots that could result in serious injury, pain, or disability.  Take your medicine at the same time every day. If you forget to take your dose of warfarin, take it as soon as you remember that day. If you do not remember on that day, do not take an extra dose the next day.  Contact your health care provider if you miss or take an extra dose. Do not change your dosage on your own to make up for missed or extra doses.  Wear or carry identification that says that you are taking warfarin.  Make sure that all health care providers, including your dentist, know you are taking warfarin.  If you need surgery, talk with your health care provider about whether you should stop taking warfarin before your surgery.  Avoid situations that cause bleeding. You may bleed more easily while taking warfarin. To limit bleeding, take the following actions: ? Use a softer toothbrush. ? Floss with waxed floss, not unwaxed floss. ? Shave with an electric razor, not with a blade. ? Limit your use of sharp objects. ? Avoid potentially harmful  activities, such as contact sports. What do I need to know about warfarin and pregnancy or breastfeeding?  Warfarin is not recommended during the first trimester of pregnancy due to an increased risk of birth defects. In certain situations, a woman may take warfarin after her first trimester of pregnancy.  If you are taking warfarin and you become pregnant or plan to become pregnant, contact your health care provider right away.  If you plan to breastfeed while taking warfarin, talk with your health care provider first. What do I need to know about warfarin and alcohol or drug use?  Avoid drinking alcohol, or limit alcohol intake to no more than 1 drink a day for nonpregnant women and 2 drinks a day for men. One drink equals 12 oz of beer, 5 oz of wine, or 1 oz of hard liquor. ? If you change the amount of alcohol that you drink, tell your health  care provider. Your warfarin dosage may need to be changed.  Avoid tobacco products, such as cigarettes, chewing tobacco, and e-cigarettes. If you need help quitting, ask your health care provider. ? If you change the amount of nicotine or tobacco that you use, tell your health care provider. Your warfarin dosage may need to be changed.  Avoid street drugs while taking warfarin. The effects of street drugs on warfarin are not known. What do I need to know about warfarin and other medicines or supplements?  Many prescription and over-the-counter medicines can interfere with warfarin. Talk with your health care provider or your pharmacist before starting or stopping any new medicines. This includes over-the-counter vitamins, dietary supplements, herbal medicines, and pain medicines. Your warfarin dosage may need to be adjusted.  Some common over-the-counter medicines that may increase the risk of bleeding while taking warfarin include: ? Acetaminophen. ? Aspirin ? NSAIDs, such as ibuprofen or naproxen. ? Vitamin E. What do I need to know about  warfarin and my diet?  It is important to maintain a normal, balanced diet while taking warfarin. Avoid major changes in your diet. If you are going to change your diet, talk with your health care provider before making changes.  Your health care provider may recommend that you work with a diet and nutrition specialist (dietitian).  Vitamin K decreases the effect of warfarin, and it is found in many foods. Eat a consistent amount of foods that contain vitamin K. For example, you may decide to eat 2 vitamin K-containing foods each day. Most foods that are high in vitamin K are green and leafy. Common foods that contain high amounts of vitamin K include:  Kale, raw or cooked.  Spinach, raw or cooked.  Collards, raw or cooked.  Swiss chard, raw or cooked.  Mustard greens, raw or cooked.  Turnip greens, raw or cooked.  Parsley, raw.  Broccoli, cooked.  Noodles, eggs, and spinach, enriched.  Brussels sprouts, raw or cooked.  Beet greens, raw or cooked.  Endive, raw.  Cabbage, cooked.  Asparagus, cooked.  Foods that contain moderate amounts of vitamin K include:  Broccoli, raw.  Cabbage, raw.  Bok choy, cooked.  Green leaf lettuce, raw  Prunes, stewed.  Angie Fava.  Kiwi.  Edamame, cooked.  Romaine lettuce, raw.  Avocado.  Tuna, canned in oil.  Okra, cooked.  Black-eyed peas, cooked.  Green beans, cooked or raw.  Blueberries, raw.  Blackberries, raw.  Peas, cooked or raw.  Contact a health care provider if:  You miss a dose.  You take an extra dose.  You plan to have any kind of surgery or procedure.  You are unable to take your medicine due to nausea, vomiting, or diarrhea.  You have any major changes in your diet or you plan to make any major changes in your diet.  You start or stop any over-the-counter medicine, prescription medicine, or dietary supplement.  You become pregnant, plan to become pregnant, or think you may be  pregnant.  You have menstrual periods that are heavier than usual.  You have unusual bruising. Get help right away if:  You develop symptoms of an allergic reaction, such as: ? Swelling of the lips, face, tongue, mouth, or throat. ? Rash. ? Itching. ? Itchy, red, swollen areas of skin (hives). ? Trouble breathing. ? Chest tightness.  You have: ? Signs or symptoms of a stroke. ? Signs or symptoms of a blood clot. ? A fall or have an accident, especially if you  hit your head. ? Blood in your urine. Your urine may look reddish, pinkish, or tea-colored. ? Blood in your stool. Your stool may be black or bright red. ? Bleeding that does not stop after applying pressure to the area for 30 minutes. ? Severe pain in your joints or back. ? Purple or blue toes. ? Skin ulcers that do not go away.  You vomit blood or cough up blood. The blood may be bright red, or it may look like coffee grounds. These symptoms may represent a serious problem that is an emergency. Do not wait to see if the symptoms will go away. Get medical help right away. Call your local emergency services (911 in the [place] Do not drive yourself to the hospital. Summary  Warfarin needs to be closely monitored with blood tests. It is very important to keep all lab visits and follow-up visits with your health care provider.  Make sure that you know your target INR range and your warfarin dosage.  Wear or carry identification that says that you are taking warfarin.  Take warfarin at the same time every day. Call your health care provider if you miss a dose or if you take an extra dose. Do not change the dosage of warfarin on your own.  Know the signs and symptoms of blood clots, bleeding, and a stroke. Know when to get emergency medical help.  Tell all health care providers who care for you that you are taking warfarin.  Talk with your health care provider or your pharmacist before starting or stopping any new  medicines.  Monitor how much vitamin K you eat every day. Try to eat the same amount every day. This information is not intended to replace advice given to you by your health care provider. Make sure you discuss any questions you have with your health care provider. Document Released: 08/20/2005 Document Revised: 05/01/2016 Document Reviewed: 11/16/2015 Elsevier Interactive Patient Education  2017 [employer].

## 2018-01-30 ENCOUNTER — Telehealth: Payer: Self-pay | Admitting: Pharmacist Clinician (PhC)/ Clinical Pharmacy Specialist

## 2018-01-30 NOTE — Telephone Encounter (Signed)
Patient gets medications filled at [facility], where they are put in blister packs and delivered on a monthly basis to him.   He has a poor understanding of what medications are for and the importance of taking them as prescribed.  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  I have concern about him being able to follow the complex directions that often come with warfarin, as it will not be able to be bubble packed for a month at this time.    Reviewed all of his medications with their pharmacist, Juliann Pulse.  His medication list now will reflect what he is taking as of today.

## 2018-02-03 ENCOUNTER — Ambulatory Visit (INDEPENDENT_AMBULATORY_CARE_PROVIDER_SITE_OTHER): Payer: Medicare Other | Admitting: Pharmacist

## 2018-02-03 DIAGNOSIS — I513 Intracardiac thrombosis, not elsewhere classified: Secondary | ICD-10-CM | POA: Diagnosis not present

## 2018-02-03 DIAGNOSIS — Z7901 Long term (current) use of anticoagulants: Secondary | ICD-10-CM

## 2018-02-03 LAB — PROTIME-INR
INR: 7.2 (ref 0.8–1.2)
Prothrombin Time: 76.7 s — ABNORMAL HIGH (ref 9.1–12.0)

## 2018-02-03 LAB — POCT INR: INR: 7.6 — AB (ref 2.0–3.0)

## 2018-02-03 NOTE — Patient Instructions (Signed)
HOLD warfarin dose today 02/03/2018 and tomorrow 02/04/18 then take 1.5 tablets daily in the evening ONLY.  Repeat INR Monday. **call clinic at [phone] if you have changes or questions**

## 2018-02-10 ENCOUNTER — Ambulatory Visit (INDEPENDENT_AMBULATORY_CARE_PROVIDER_SITE_OTHER): Payer: Medicare Other | Admitting: Pharmacist

## 2018-02-10 DIAGNOSIS — Z7901 Long term (current) use of anticoagulants: Secondary | ICD-10-CM | POA: Diagnosis not present

## 2018-02-10 DIAGNOSIS — I513 Intracardiac thrombosis, not elsewhere classified: Secondary | ICD-10-CM

## 2018-02-10 LAB — PROTIME-INR

## 2018-02-10 NOTE — Patient Instructions (Signed)
HOLD warfarin dose today 02/10/2018 and tomorrow 02/11/18  Repeat INR Wednesday.  EAT some greens (collards or spinach) **call clinic at [phone] if you have changes or questions**If bleeding please visit nearest ED/Urgen care/ talked to caregiver on the phone with instructions*

## 2018-02-13 ENCOUNTER — Telehealth: Payer: Self-pay | Admitting: Pharmacist

## 2018-02-13 NOTE — Telephone Encounter (Signed)
Patient "no show" for coumadin check yesterday.  Talked to girlfriend today. He is going well and at home. She will communicate new appointment time for tomorrow to patient.

## 2018-02-14 ENCOUNTER — Telehealth: Payer: Self-pay | Admitting: Pharmacist Clinician (PhC)/ Clinical Pharmacy Specialist

## 2018-02-14 ENCOUNTER — Telehealth: Payer: Self-pay | Admitting: Physician Assistant

## 2018-02-14 NOTE — Telephone Encounter (Signed)
Ms. Lacinda Axon is calling because Mr. Arenson needs an INR done on Monday and she was trying to make an appointment.  Will route this to the pharmacist.  Rosaria Ferries, PA-C 02/14/2018 1:45 PM Beeper [phone]

## 2018-02-14 NOTE — Telephone Encounter (Signed)
Patient did not show for INR check today.  Last INR on Monday was > 10. Missed appointment on Wednesday, a message was left on his VM to please come in today for follow up.   LMOM again today, stressing the need to follow instructions with warfarin, as there are dangers to having such elevated numbers.    Called sister [name] (contact information in system), she was not available, will try her again after lunch.

## 2018-02-17 ENCOUNTER — Ambulatory Visit (INDEPENDENT_AMBULATORY_CARE_PROVIDER_SITE_OTHER): Payer: Medicare Other | Admitting: Pharmacist Clinician (PhC)/ Clinical Pharmacy Specialist

## 2018-02-17 ENCOUNTER — Other Ambulatory Visit: Payer: Self-pay

## 2018-02-17 DIAGNOSIS — I513 Intracardiac thrombosis, not elsewhere classified: Secondary | ICD-10-CM | POA: Diagnosis not present

## 2018-02-17 DIAGNOSIS — Z7901 Long term (current) use of anticoagulants: Secondary | ICD-10-CM | POA: Diagnosis not present

## 2018-02-17 LAB — POCT INR: INR: 8 — AB (ref 2.0–3.0)

## 2018-02-17 MED ORDER — POTASSIUM CHLORIDE CRYS ER 10 MEQ PO TBCR: 10.0000 meq | EXTENDED_RELEASE_TABLET | Freq: Two times a day (BID) | ORAL | 10 refills | Status: DC

## 2018-02-17 NOTE — Patient Instructions (Signed)
Description   No warfarin Monday June 17 or Tuesday June 18. (or Wednesday June 19 before the appointment).  Bring your warfarin bottle and Bobby [name] with  You on Wednesday.  If you have any traumatic injury or bleeding that won't stop, go to the Emergency Room

## 2018-02-18 ENCOUNTER — Other Ambulatory Visit: Payer: Self-pay | Admitting: *Deleted

## 2018-02-18 NOTE — Telephone Encounter (Signed)
Patient sees in clinic yesterday Monday 6/17. Next f/u appt already scheduled for 02/19/18   Thank you.

## 2018-02-19 ENCOUNTER — Telehealth: Payer: Self-pay | Admitting: *Deleted

## 2018-02-19 ENCOUNTER — Telehealth: Payer: Self-pay | Admitting: Cardiovascular Disease

## 2018-02-19 ENCOUNTER — Ambulatory Visit (INDEPENDENT_AMBULATORY_CARE_PROVIDER_SITE_OTHER): Payer: Medicare Other | Admitting: Pharmacist

## 2018-02-19 DIAGNOSIS — I513 Intracardiac thrombosis, not elsewhere classified: Secondary | ICD-10-CM

## 2018-02-19 DIAGNOSIS — Z7901 Long term (current) use of anticoagulants: Secondary | ICD-10-CM

## 2018-02-19 LAB — CBC
HEMATOCRIT: 37.2 % — AB (ref 37.5–51.0)
HEMOGLOBIN: 12.5 g/dL — AB (ref 13.0–17.7)
MCH: 29.7 pg (ref 26.6–33.0)
MCHC: 33.6 g/dL (ref 31.5–35.7)
MCV: 88 fL (ref 79–97)
Platelets: 186 10*3/uL (ref 150–450)
RBC: 4.21 x10E6/uL (ref 4.14–5.80)
RDW: 16.4 % — ABNORMAL HIGH (ref 12.3–15.4)
WBC: 8.5 10*3/uL (ref 3.4–10.8)

## 2018-02-19 LAB — PROTIME-INR
INR: 8.6 (ref 0.8–1.2)
Prothrombin Time: 90.8 s — ABNORMAL HIGH (ref 9.1–12.0)

## 2018-02-19 LAB — POCT INR: INR: 8 — AB (ref 2.0–3.0)

## 2018-02-19 MED ORDER — PHYTONADIONE 5 MG PO TABS: 5.0000 mg | ORAL_TABLET | Freq: Once | ORAL | 0 refills | Status: AC

## 2018-02-19 MED FILL — PHYTONADIONE 5 MG TABS: 5 | 1 days supply | Qty: 1 | Fill #0

## 2018-02-19 NOTE — Telephone Encounter (Signed)
Critical lab called in from [facility] with a critical INR of 8.6. Results in epic. Pharmd has been made aware.

## 2018-02-19 NOTE — Telephone Encounter (Signed)
LM to schedule appt with Dr. Claiborne Billings Friday 6/21

## 2018-02-19 NOTE — Telephone Encounter (Signed)
New Message   Bobby [name] with [facility] is calling with critical labs

## 2018-02-19 NOTE — Patient Instructions (Addendum)
BP 90/54 1. Stay hydrated 2. NO blood pressure medication (lisinopril) tomorrow 6/20 and no blood pressure medication (lisinopril) on 6/21 3. NO MORE warfarin - discontinued 4. Take vitamin K (pytonadione) 5mg  today (NOW) 5. Repeat INR on Friday 02/19/2018

## 2018-02-21 ENCOUNTER — Other Ambulatory Visit: Payer: Self-pay | Admitting: Cardiovascular Disease

## 2018-02-21 ENCOUNTER — Telehealth: Payer: Self-pay | Admitting: Cardiovascular Disease

## 2018-02-21 ENCOUNTER — Ambulatory Visit (INDEPENDENT_AMBULATORY_CARE_PROVIDER_SITE_OTHER): Payer: Medicare Other | Admitting: Cardiovascular Disease

## 2018-02-21 ENCOUNTER — Encounter: Payer: Self-pay | Admitting: Cardiovascular Disease

## 2018-02-21 ENCOUNTER — Ambulatory Visit (INDEPENDENT_AMBULATORY_CARE_PROVIDER_SITE_OTHER): Payer: Medicare Other | Admitting: Pharmacist

## 2018-02-21 VITALS — BP 112/56 | HR 95 | Ht 71.0 in | Wt 245.0 lb

## 2018-02-21 DIAGNOSIS — Z7901 Long term (current) use of anticoagulants: Secondary | ICD-10-CM | POA: Diagnosis not present

## 2018-02-21 DIAGNOSIS — I952 Hypotension due to drugs: Secondary | ICD-10-CM

## 2018-02-21 DIAGNOSIS — Z79899 Other long term (current) drug therapy: Secondary | ICD-10-CM

## 2018-02-21 DIAGNOSIS — E782 Mixed hyperlipidemia: Secondary | ICD-10-CM

## 2018-02-21 DIAGNOSIS — N184 Chronic kidney disease, stage 4 (severe): Secondary | ICD-10-CM | POA: Diagnosis not present

## 2018-02-21 DIAGNOSIS — I513 Intracardiac thrombosis, not elsewhere classified: Secondary | ICD-10-CM

## 2018-02-21 DIAGNOSIS — G4719 Other hypersomnia: Secondary | ICD-10-CM | POA: Diagnosis not present

## 2018-02-21 DIAGNOSIS — G4733 Obstructive sleep apnea (adult) (pediatric): Secondary | ICD-10-CM | POA: Diagnosis not present

## 2018-02-21 DIAGNOSIS — R0683 Snoring: Secondary | ICD-10-CM | POA: Diagnosis not present

## 2018-02-21 DIAGNOSIS — Z8673 Personal history of transient ischemic attack (TIA), and cerebral infarction without residual deficits: Secondary | ICD-10-CM | POA: Diagnosis not present

## 2018-02-21 LAB — BASIC METABOLIC PANEL
BUN/Creatinine Ratio: 18 (ref 10–24)
BUN: 49 mg/dL — ABNORMAL HIGH (ref 8–27)
CO2: 19 mmol/L — AB (ref 20–29)
Calcium: 9.7 mg/dL (ref 8.6–10.2)
Chloride: 103 mmol/L (ref 96–106)
Creatinine, Ser: 2.7 mg/dL — ABNORMAL HIGH (ref 0.76–1.27)
GFR calc Af Amer: 26 mL/min/{1.73_m2} — ABNORMAL LOW (ref >59)
GFR, EST NON AFRICAN AMERICAN: 23 mL/min/{1.73_m2} — AB (ref >59)
Glucose: 89 mg/dL (ref 65–99)
POTASSIUM: 5.1 mmol/L (ref 3.5–5.2)
SODIUM: 135 mmol/L (ref 134–144)

## 2018-02-21 LAB — TSH: TSH: 0.598 u[IU]/mL (ref 0.450–4.500)

## 2018-02-21 LAB — POCT INR: INR: 4.3 — AB (ref 2.0–3.0)

## 2018-02-21 MED ORDER — LISINOPRIL 5 MG PO TABS: 5.0000 mg | ORAL_TABLET | Freq: Every day | ORAL | 3 refills | Status: DC

## 2018-02-21 MED ORDER — FUROSEMIDE 20 MG PO TABS: 20.0000 mg | ORAL_TABLET | Freq: Every day | ORAL | 3 refills | Status: DC

## 2018-02-21 MED ORDER — POTASSIUM CHLORIDE CRYS ER 10 MEQ PO TBCR: 10.0000 meq | EXTENDED_RELEASE_TABLET | Freq: Every day | ORAL | 3 refills | Status: DC

## 2018-02-21 NOTE — Patient Instructions (Addendum)
Medication Instructions:  HOLD Lisinopril for 2 days --on Monday restart at 5 mg daily  HOLD furosemide for 2 days ---on Monday restart at 20 mg daily  Labwork: TODAY (BMET, TSH)  Follow-Up: Tuesday with pharmacist  Sleep study Tuesday night  Dr. Claiborne Billings 7/17 at 12pm  Any Other Special Instructions Will Be Listed Below (If Applicable).     If you need a refill on your cardiac medications before your next appointment, please call your pharmacy.
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
## 2018-02-21 NOTE — Telephone Encounter (Signed)
Returned call to sister-advised per Dr. Claiborne Billings, STOP Aspirin.   Sister also request I call and speak to pharmacist in regards to medications.   [facility]per Dr. Claiborne Billings, continue taking potassium 10 meq once daily (as this is what patient has been taking, although was reported as BID at [place] today).    Cathy aware and verbalized understanding.   She also had questions about Zoloft-advised to refer to PCP.

## 2018-02-21 NOTE — Telephone Encounter (Signed)
Pt's sister is calling    Stating she is returning call to nurse. Please call

## 2018-02-23 ENCOUNTER — Encounter: Payer: Self-pay | Admitting: Cardiovascular Disease

## 2018-02-25 ENCOUNTER — Ambulatory Visit (INDEPENDENT_AMBULATORY_CARE_PROVIDER_SITE_OTHER): Payer: Medicare Other | Admitting: Pharmacist

## 2018-02-25 ENCOUNTER — Ambulatory Visit (HOSPITAL_BASED_OUTPATIENT_CLINIC_OR_DEPARTMENT_OTHER): Payer: Medicare Other | Attending: Cardiovascular Disease | Admitting: Cardiovascular Disease

## 2018-02-25 ENCOUNTER — Other Ambulatory Visit: Payer: Self-pay | Admitting: *Deleted

## 2018-02-25 ENCOUNTER — Telehealth: Payer: Self-pay | Admitting: Cardiovascular Disease

## 2018-02-25 VITALS — Ht 69.0 in | Wt 245.0 lb

## 2018-02-25 DIAGNOSIS — Z7901 Long term (current) use of anticoagulants: Secondary | ICD-10-CM | POA: Diagnosis not present

## 2018-02-25 DIAGNOSIS — E669 Obesity, unspecified: Secondary | ICD-10-CM | POA: Insufficient documentation

## 2018-02-25 DIAGNOSIS — I513 Intracardiac thrombosis, not elsewhere classified: Secondary | ICD-10-CM | POA: Diagnosis not present

## 2018-02-25 DIAGNOSIS — I252 Old myocardial infarction: Secondary | ICD-10-CM | POA: Insufficient documentation

## 2018-02-25 DIAGNOSIS — I2510 Atherosclerotic heart disease of native coronary artery without angina pectoris: Secondary | ICD-10-CM | POA: Insufficient documentation

## 2018-02-25 DIAGNOSIS — R400 Somnolence: Secondary | ICD-10-CM | POA: Diagnosis not present

## 2018-02-25 DIAGNOSIS — I2583 Coronary atherosclerosis due to lipid rich plaque: Secondary | ICD-10-CM

## 2018-02-25 DIAGNOSIS — R0683 Snoring: Secondary | ICD-10-CM | POA: Diagnosis not present

## 2018-02-25 DIAGNOSIS — I110 Hypertensive heart disease with heart failure: Secondary | ICD-10-CM | POA: Insufficient documentation

## 2018-02-25 DIAGNOSIS — G4730 Sleep apnea, unspecified: Secondary | ICD-10-CM | POA: Diagnosis present

## 2018-02-25 DIAGNOSIS — R0681 Apnea, not elsewhere classified: Secondary | ICD-10-CM

## 2018-02-25 DIAGNOSIS — G4733 Obstructive sleep apnea (adult) (pediatric): Secondary | ICD-10-CM | POA: Diagnosis not present

## 2018-02-25 DIAGNOSIS — G4719 Other hypersomnia: Secondary | ICD-10-CM

## 2018-02-25 DIAGNOSIS — Z79899 Other long term (current) drug therapy: Secondary | ICD-10-CM

## 2018-02-25 DIAGNOSIS — I10 Essential (primary) hypertension: Secondary | ICD-10-CM

## 2018-02-25 DIAGNOSIS — I5023 Acute on chronic systolic (congestive) heart failure: Secondary | ICD-10-CM | POA: Insufficient documentation

## 2018-02-25 DIAGNOSIS — N184 Chronic kidney disease, stage 4 (severe): Secondary | ICD-10-CM

## 2018-02-25 DIAGNOSIS — I2102 ST elevation (STEMI) myocardial infarction involving left anterior descending coronary artery: Secondary | ICD-10-CM

## 2018-02-25 LAB — POCT INR: INR: 2 (ref 2.0–3.0)

## 2018-02-25 MED ORDER — APIXABAN 5 MG PO TABS: 5.0000 mg | ORAL_TABLET | Freq: Two times a day (BID) | ORAL | 2 refills | Status: DC

## 2018-02-25 NOTE — Telephone Encounter (Signed)
New Message   Pt's sister states that a nurse was suppose to call her but she never received the call

## 2018-02-26 ENCOUNTER — Ambulatory Visit: Payer: Medicare Other | Admitting: Cardiovascular Disease

## 2018-02-26 NOTE — Telephone Encounter (Signed)
Spoke with patients sister, Olegario Shearer, she was returning a call re: her conversation with Hayley. She is still wanting to consider Home Health options. I advised her that it would be good for her to talk with his Primary care Doctor. She was also asking about med changes, reassured her that he should still be holding the Lisinopril per the Pharm D and will readvise her after his next Bmet on Monday 7/1. She was also wondering if he should continue the night time oxygen but I advised her that we will talk with Dr. Claiborne Billings after the results of his sleep study that was done last night. Pt verbalized understanding and will call back if she has any other questions or problems.

## 2018-02-27 NOTE — Telephone Encounter (Signed)
Health Coach Vivien Rota) from [place] is following.

## 2018-02-28 ENCOUNTER — Telehealth: Payer: Self-pay

## 2018-02-28 NOTE — Telephone Encounter (Signed)
Returned call, left message to make aware [facility] will be calling to discuss resources for patient.  Advised to call back with questions or concerns.

## 2018-02-28 NOTE — Telephone Encounter (Signed)
Pt sister would like information on home health services that assist with home tasks as well as rx assistance. Will look into resources for pt and call back.

## 2018-03-02 ENCOUNTER — Encounter (HOSPITAL_BASED_OUTPATIENT_CLINIC_OR_DEPARTMENT_OTHER): Payer: Self-pay | Admitting: Cardiovascular Disease

## 2018-03-02 NOTE — Procedures (Signed)
  Patient Name: Bobby [name], Bobby [name] Study Date: 02/25/2018 Gender: Male D.O.B: 09/22/1945 Age (years): 72 Referring Provider: Thomas Kelly MD, ABSM Height (inches): 69 Interpreting Physician: Thomas Kelly MD, ABSM Weight (lbs): 245 RPSGT: Mckinney, Takeya BMI: 36 MRN: [id number] Neck Size: 19.00  CLINICAL INFORMATION Sleep Study Type: Split Night CPAP  Indication for sleep study: Congestive Heart Failure, COPD, Diabetes, Excessive Daytime Sleepiness, Fatigue, Hypertension, Obesity, OSA, Snoring  Epworth Sleepiness Score: 15  SLEEP STUDY TECHNIQUE As per the AASM Manual for the Scoring of Sleep and Associated Events v2.3 (April 2016) with a hypopnea requiring 4% desaturations.  The channels recorded and monitored were frontal, central and occipital EEG, electrooculogram (EOG), submentalis EMG (chin), nasal and oral airflow, thoracic and abdominal wall motion, anterior tibialis EMG, snore microphone, electrocardiogram, and pulse oximetry. Continuous positive airway pressure (CPAP) was initiated when the patient met split night criteria and was titrated according to treat sleep-disordered breathing.  MEDICATIONS     albuterol (PROVENTIL HFA;VENTOLIN HFA) 108 (90 Base) MCG/ACT inhaler         albuterol (PROVENTIL) (2.5 MG/3ML) 0.083% nebulizer solution         allopurinol (ZYLOPRIM) 300 MG tablet         apixaban (ELIQUIS) 5 MG TABS tablet         atorvastatin (LIPITOR) 80 MG tablet         buPROPion (WELLBUTRIN XL) 300 MG 24 hr tablet         carvedilol (COREG) 6.25 MG tablet         clonazePAM (KLONOPIN) 0.5 MG tablet         clopidogrel (PLAVIX) 75 MG tablet         COLCRYS 0.6 MG tablet         furosemide (LASIX) 20 MG tablet         lisinopril (PRINIVIL,ZESTRIL) 5 MG tablet         metFORMIN (GLUCOPHAGE-XR) 500 MG 24 hr tablet         nitroGLYCERIN (NITROSTAT) 0.4 MG SL tablet         Omega-3 Fatty Acids (FISH OIL PO)         oxyCODONE-acetaminophen (PERCOCET) 10-325 MG  per tablet         potassium chloride (K-DUR,KLOR-CON) 10 MEQ tablet         sertraline (ZOLOFT) 100 MG tablet         Tiotropium Bromide-Olodaterol (STIOLTO RESPIMAT) 2.5-2.5 MCG/ACT AERS      Medications self-administered by patient taken the night of the study : N/A  RESPIRATORY PARAMETERS Diagnostic Total AHI (/hr): 22.2 RDI (/hr): 25.4 OA Index (/hr): 0.3 CA Index (/hr): 0.0 REM AHI (/hr): N/A NREM AHI (/hr): 22.2 Supine AHI (/hr): 20.4 Non-supine AHI (/hr): 22.59 Min O2 Sat (%): 87.0 Mean O2 (%): 90.9 Time below 88% (min): 4.7   Titration Optimal Pressure (cm):  AHI at Optimal Pressure (/hr): N/A Min O2 at Optimal Pressure (%): 89.0 Supine % at Optimal (%): N/A Sleep % at Optimal (%): N/A   SLEEP ARCHITECTURE The recording time for the entire night was 461.2 minutes.  During a baseline period of 251.2 minutes, the patient slept for 238.2 minutes in REM and nonREM, yielding a sleep efficiency of 94.8%%. Sleep onset after lights out was 5.5 minutes with a REM latency of N/A minutes. The patient spent 5.7%% of the night in stage N1 sleep, 94.3%% in stage N2 sleep, 0.0%% in stage N3 and 0.0%% in REM.   During   the titration period of 201.0 minutes, the patient slept for 183.0 minutes in REM and nonREM, yielding a sleep efficiency of 91.0%%. Sleep onset after CPAP initiation was 0.0 minutes with a REM latency of N/A minutes. The patient spent 2.1%% of the night in stage N1 sleep, 97.9%% in stage N2 sleep, 0.0%% in stage N3 and 0.0%% in REM.  CARDIAC DATA The 2 lead EKG demonstrated sinus rhythm. The mean heart rate was 100.0 beats per minute. Other EKG findings include: PVCs  LEG MOVEMENT DATA During the titration study, the total Periodic Limb Movements of Sleep (PLMS) were 105. The PLMS index was 34.4. limb movement with arousal was 10 with an index of 3.3.  IMPRESSIONS - Moderate obstructive sleep apnea occurred during the diagnostic portion of the study (AHI 22.2/h; RDI 25.4/h).  However, REM sleep was not achieved and the overall severity may underestimate the severity of the patient's sleep-disordered breathing.  CPAP was instituted at 5 cm water pressure and was titrated up to 10 cm.  AHI at 10 cm was 0.  REM sleep was not achieved.  Oxygen nadir at 10 cm was 91%. - No significant central sleep apnea occurred during the diagnostic portion of the study (CAI = 0.0/hour). - Mild oxygen desaturation was noted during the diagnostic portion of the study to a nadir of 87% with non-REM sleep. - The patient snored with soft snoring volume during the diagnostic portion of the study. - EKG findings include PVCs. - Clinically significant periodic limb movements did not occur during sleep.  DIAGNOSIS - Obstructive Sleep Apnea (327.23 [G47.33 ICD-10])  RECOMMENDATIONS - Recommend an initial trial of CPAP auto with EPR at a range of 8-15 cm of water with heated humidification.  A [employer] size medium was used for the titration study. - Efforts should be made to optimize nasal and oropharyngeal patency. - Avoid alcohol, sedatives and other CNS depressants that may worsen sleep apnea and disrupt normal sleep architecture. - If patient is symptomatic with restless legs on CPAP therapy, consider trial of pharmacotherapy. - Sleep hygiene should be reviewed to assess factors that may improve sleep quality. - Weight management (BMI 36) and regular exercise should be initiated or continued. - Recommend a download be obtained in 30 days and sleep clinic evaluation after 4 weeks of therapy  [Electronically signed] 03/02/2018 12:22 PM  Thomas Kelly MD,FACC,  ABSM Diplomate, American Board of Sleep Medicine   NPI: [id number] [facility] PH: [phone]   FX: [phone] ACCREDITED BY THE AMERICAN ACADEMY OF SLEEP MEDICINE 

## 2018-03-03 ENCOUNTER — Telehealth: Payer: Self-pay | Admitting: Cardiovascular Disease

## 2018-03-03 ENCOUNTER — Telehealth: Payer: Self-pay | Admitting: *Deleted

## 2018-03-03 NOTE — Telephone Encounter (Signed)
-----   Message from Troy Sine, MD sent at 03/02/2018 12:26 PM EDT ----- Bobby [name] , please notify Mr. Belarus of his results.  Set up with DME company for CPAP set up.  Arrange for a download in 30 days and follow-up sleep clinic evaluation.

## 2018-03-03 NOTE — Telephone Encounter (Signed)
New Message:       Pt's sister is calling to see if the pt had any changes to his medications the last time he was in to see the dr.

## 2018-03-03 NOTE — Telephone Encounter (Signed)
Spoke with pt sister, aware the decision about restarting lisinopril will be determined after the lab work done today. Otherwise, I do not see any medication changes have been made.

## 2018-03-03 NOTE — Telephone Encounter (Signed)
CPAP referral sent to [facility] for set up.

## 2018-03-03 NOTE — Telephone Encounter (Signed)
Patient's sister, Armando Gang notified of sleep study results and recommendations. She voiced verbal understanding and asked for me to give the [employer] company her contact information.

## 2018-03-04 LAB — BASIC METABOLIC PANEL
BUN/Creatinine Ratio: 17 (ref 10–24)
BUN: 23 mg/dL (ref 8–27)
CALCIUM: 10 mg/dL (ref 8.6–10.2)
CHLORIDE: 112 mmol/L — AB (ref 96–106)
CO2: 22 mmol/L (ref 20–29)
Creatinine, Ser: 1.36 mg/dL — ABNORMAL HIGH (ref 0.76–1.27)
GFR, EST AFRICAN AMERICAN: 60 mL/min/{1.73_m2} (ref >59)
GFR, EST NON AFRICAN AMERICAN: 52 mL/min/{1.73_m2} — AB (ref >59)
Glucose: 131 mg/dL — ABNORMAL HIGH (ref 65–99)
Potassium: 4.4 mmol/L (ref 3.5–5.2)
Sodium: 137 mmol/L (ref 134–144)

## 2018-03-11 ENCOUNTER — Other Ambulatory Visit: Payer: Self-pay | Admitting: Cardiovascular Disease

## 2018-03-11 MED ORDER — POTASSIUM CHLORIDE CRYS ER 10 MEQ PO TBCR: 10.0000 meq | EXTENDED_RELEASE_TABLET | Freq: Every day | ORAL | 3 refills | Status: DC

## 2018-03-11 MED ORDER — APIXABAN 5 MG PO TABS: 5.0000 mg | ORAL_TABLET | Freq: Two times a day (BID) | ORAL | 3 refills | Status: DC

## 2018-03-11 MED ORDER — CLOPIDOGREL BISULFATE 75 MG PO TABS: 75.0000 mg | ORAL_TABLET | Freq: Every day | ORAL | 3 refills | Status: DC

## 2018-03-11 MED ORDER — LISINOPRIL 5 MG PO TABS: 5.0000 mg | ORAL_TABLET | Freq: Every day | ORAL | 3 refills | Status: DC

## 2018-03-11 MED ORDER — CARVEDILOL 6.25 MG PO TABS: ORAL_TABLET | ORAL | 3 refills | Status: DC

## 2018-03-11 MED ORDER — ATORVASTATIN CALCIUM 80 MG PO TABS: 80.0000 mg | ORAL_TABLET | Freq: Every day | ORAL | 3 refills | Status: AC

## 2018-03-11 MED ORDER — FUROSEMIDE 20 MG PO TABS: 20.0000 mg | ORAL_TABLET | Freq: Every day | ORAL | 3 refills | Status: DC

## 2018-03-11 NOTE — Telephone Encounter (Signed)
Pt's sister is calling and stating pt is changing pharmacy to [facility] and need new  90day prescription for pt medications. Please call pt's sister.

## 2018-03-11 NOTE — Telephone Encounter (Signed)
Rx(s) sent to pharmacy electronically. Sister called & made aware

## 2018-03-12 ENCOUNTER — Telehealth: Payer: Self-pay | Admitting: *Deleted

## 2018-03-12 NOTE — Telephone Encounter (Signed)
Returned a call to patient's sister. No answer. Could not leave message on machine. VM not set up.

## 2018-03-14 ENCOUNTER — Telehealth: Payer: Self-pay

## 2018-03-18 ENCOUNTER — Telehealth: Payer: Self-pay | Admitting: Cardiovascular Disease

## 2018-03-18 NOTE — Telephone Encounter (Signed)
New Message      Pt c/o medication issue:  1. Name of Medication: lisinopril and furosemide.  2. How are you currently taking this medication (dosage and times per day)? Not taking at this time  3. Are you having a reaction (difficulty breathing--STAT)? Yes  4. What is your medication issue? Patient was advised to stop taking medication. Since then patient has been lethargic and puffy.

## 2018-03-18 NOTE — Telephone Encounter (Signed)
Will discuss with patient and daughter at office visit tomorrow.

## 2018-03-18 NOTE — Telephone Encounter (Addendum)
Spoke with pt's sister who was calling in to verify medication pt's correct medication. Sister informed that based on chart review pt was to hold lasix and lisinopril until further notice based on recent lab work. She reports that she has been holding the medication but spoke to his pcp last night to report that he looks puffy , hasn't been feeling well, and has no energy since he had a reaction to the warfarin. She states she doesn't know is current weight but was instructed to restart his lasix and lisinopril by his pcp. She states per pcp his last lab work showed something was elevated( wasn't able to recall lab) and she would be calling to talk with Dr. Claiborne Billings today. Daughter also states she wanted to know if he was suppose to continue giving potassium since he had stop lasix. Pt has an appointment scheduled for tomorrow 7/17 with Dr. Claiborne Billings. Routing for clarification.

## 2018-03-19 ENCOUNTER — Encounter: Payer: Self-pay | Admitting: Cardiovascular Disease

## 2018-03-19 ENCOUNTER — Ambulatory Visit (INDEPENDENT_AMBULATORY_CARE_PROVIDER_SITE_OTHER): Payer: Medicare Other | Admitting: Cardiovascular Disease

## 2018-03-19 VITALS — BP 122/72 | HR 89 | Ht 69.0 in | Wt 244.2 lb

## 2018-03-19 DIAGNOSIS — I255 Ischemic cardiomyopathy: Secondary | ICD-10-CM

## 2018-03-19 DIAGNOSIS — I513 Intracardiac thrombosis, not elsewhere classified: Secondary | ICD-10-CM

## 2018-03-19 DIAGNOSIS — E782 Mixed hyperlipidemia: Secondary | ICD-10-CM

## 2018-03-19 DIAGNOSIS — I252 Old myocardial infarction: Secondary | ICD-10-CM

## 2018-03-19 DIAGNOSIS — I2510 Atherosclerotic heart disease of native coronary artery without angina pectoris: Secondary | ICD-10-CM | POA: Diagnosis not present

## 2018-03-19 DIAGNOSIS — G4733 Obstructive sleep apnea (adult) (pediatric): Secondary | ICD-10-CM

## 2018-03-19 DIAGNOSIS — N184 Chronic kidney disease, stage 4 (severe): Secondary | ICD-10-CM | POA: Diagnosis not present

## 2018-03-19 MED ORDER — LISINOPRIL 2.5 MG PO TABS: 2.5000 mg | ORAL_TABLET | Freq: Every day | ORAL | 3 refills | Status: DC

## 2018-03-19 NOTE — Progress Notes (Signed)
Patient ID: Bobby [name], male   DOB: 1946-08-11, 72 y.o.   MRN: [id number]    HPI: Bobby [name] is a 72 y.o. male presents to the office today for a 4 week followup evaluation.   Bobby [name]  has a long-standing tobacco history having started smoking at age 73, a history of hypertension, remote small TIA/CVA who presented to the hospital on 08/04/2012 in the setting of inferior ST segment elevation myocardial infarction. Catheterization by me revealed a 99% stenosis of the RCA and concomitant CAD involving his LAD and circumflex vessels. He underwent acute percutaneous coronary intervention with an excellent door to balloon time of only 20 minutes and ultimately insertion of a 3.25x32 mm DES stent post dilated 3.3. An echo done in the hospital showed an EF of 50-60% with mild basal inferior probable scar and moderate pulmonary hypertension with PA pressure of 45 mm.  When I saw Bobby [name] in May 2014, he did note shortness of breath with activity. He denied definitive chest pain. At that time, he realized that he had inadvertentlystopped taking the atorvastatin 40 mg dose and this was resumed. In addition, his Toprol dose was increased from 50 to 75 mg daily. A nuclear study showed an ejection fraction of 66%. Perfusion was essentially normal with exception of a small region of fixed inferoseptal bowel artifact and possible small area of inferobasilar scar.   Bobby [name] unfortunately continues to smoke at least a pack of cigarettes per day.   He does experience shortness of breath with activity.  He denies recent chest pain.  He has chronic right bundle branch block with repolarization changes.  He is diabetic and has pulmonary hypertension.  A follow-up nuclear perfusion study on 03/26/2014 remianed low risk and showed normal LV function and normal wall motion.  There was a fixed inferior defect that was felt most consistent with diaphragmatic attenuation.  There was no evidence for ischemia.  He underwent  prostate surgery by Dr. [name] without cardiovascular compromise.    A screening abdominal aortic ultrasound revealed a normal abdominal aorta.  Since I  saw him in November 2017, he has had an extensive history and has had recurrent hospitalizations he was hospitalized in October 2018 for left arm pain.  Troponin was negative.  In December 2018 he suffered an anterior ST segment elevation MI due to acute occlusion of his mid LAD and underwent successful PCI of his LAD with insertion of a 2.5 x 22 mm Resolute Onyx DES stent.  He was also found to have 99% dominant mid left circumflex lesion with 80% stenosis in the OM1 vessel and underwent staged intervention to the mid circumflex and marginal vessels.  Troponin was greater than 65.  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  There was mention of sludge is a sign of pre-thrombotic state but no definite thrombus was seen at the apex.  He returned to the hospital August 20, 2017 with malaise and dyspnea.  He was bradycardic.  He was diuresed.  He has renal insufficiency with creatinines increasing up to 1.9.  He has been followed by [name] on numerous occasions in the office.  Unfortunately continues to smoke cigarettes.  He admits to 1/2 pack/day.  He does note occasional dizziness as well as some shortness of breath.  He denies recurrent anginal type symptoms.   Suggestive of low flow state.  I saw him for  evaluation on Jan 07, 2018.  At that time, I recommended that he undergo a follow-up echo Doppler study to reassess systolic and diastolic function as well as potential for apical thrombus formation.  He had continued to be on aspirin and Brilinta following his STEMI.  He was on atorvastatin 80 mg for hyperlipidemia with target less than 70.  I had a long discussion regarding smoking cessation.  He underwent a follow-up echo Doppler study on Jan 20, 2018.  This showed reduced LV  function with an EF of 35 to 40% with akinesis of the mid apical anterolateral and septal as well as apical walls.  There was grade 1 diastolic dysfunction.  There was now a possible small layered thrombus at the apex noted using Definity and also evidence of swirling at the LV apex suggestive of low flow state.   As result of the echo findings, I saw him in follow-up on Jan 24, 2018 at which time I had a very long discussion with him guarding potential thromboembolic stroke risk.  He was started on warfarin anticoagulation.  Apparently, the patient's medications have been in a punch pack and had been followed by his girlfriend.  Apparently the patient has not been taking his medications correctly.  The result is been significant over anticoagulation such that his INR reached greater than 10.  He has been followed very closely by our pharmacy department and he also was given vitamin K.  Due to his poor medication compliance after much discussion ultimately the thought is for him to transition to Eliquis once his INR gets below 2.  He also has been taking his blood pressure medicines incorrectly.  Patient has been sleeping most of the day.  In the past I had recommended he undergo a sleep evaluation for sleep apnea which he never followed up with it was a no-show in the sleep lab.  He was worked into my schedule on February 21, 2018 and was seen as an add-on due to his medication issues.  He was here now with his sister who in the past had done an excellent job in caring for his medications and she is committed now to resume doing this for him.  During his last evaluation, he was exceptionally somnolent, and a significant time was spent with medication adjustment.  Due to his inability to take effect of warfarin the decision was made to use Eliquis for anticoagulation with more consistent dosing.  His recent laboratory had shown stage IV chronic kidney disease and it was recommended that he hold his lisinopril for  several days and then reduce the dose to 5 mg as well as holding his furosemide with subsequent reduction of dose with reinstitution at 20 mg.  I scheduled him for an expeditious sleep study due to concerns for significant sleep apnea.  Is also seen 5 days later by Racquel with medication adjustment and anticoagulation issues.  He underwent a sleep study on February 25, 2018 and he was found to have moderate overall sleep apnea with an AHI of 22.2 and RDI of 25.4.  He could not achieve any rem sleep and the overall severity may very well be underestimated.  He was titrated up to 10 cm water pressure.  An initial trial of CPAP auto with an EPR range of 8-15 was recommended.  He had just initiated CPAP therapy last evening.  He he believes he slept very well.  He had undergone repeat evaluation following his medication adjustment and his renal function  had significantly improved from a creatinine of 2.7 down to 1.362 weeks ago.  He denies any episodes of chest pain.  He admits to be much more alert.  He presents for reevaluation.  Past Medical History:  Diagnosis Date  . CAD (coronary artery disease),residual non obstructive disease    a. 08/2012 Inferior STEMI: RCA 99% (3.5x32 Promus DES), nonobs LAD/LCX dzs;  b. 01/2013 MV: EF 66%, small inferoseptal artifact and possible small inferobasilar scar;  c. 03/2014 MV: fixed inf defect->diaph attenuation, no ischemia. 12/18 PCI/DES to mLAD, staged PCI/DES Lcx/OM  . DM (diabetes mellitus), type 2 new diagnosis 08/05/2012   diet control  . ED (erectile dysfunction)    surgery planned  . History of echocardiogram    a. 08/2012 Echo: EF 55-60%, possible mild basal inferior HK, mildly dil RA, PASP 24mHg.  .Marland KitchenHyperlipidemia 08/05/2012  . Hypertensive heart disease   . [place] [place]   acute inferior stemi secondary to RCA occlusion; PCI  . Panic attack   . [place]    echo 08/05/12, EF 55-60%, PA pressure 430m . S/P coronary  artery stent placement, to RCA Promus DES 08/05/2012  . [facility]   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  . Tobacco abuse     Past Surgical History:  Procedure Laterality Date  . CARDIAC CATHETERIZATION  08/04/12   PCI to RCA with DES  . CORONARY STENT INTERVENTION N/A 08/12/2017   Procedure: CORONARY STENT INTERVENTION;  Surgeon: JoMartiniquePeter M, MD;  [facility];  Service: Cardiovascular;  Laterality: N/A;  . CORONARY/GRAFT ACUTE MI REVASCULARIZATION N/A 08/09/2017   Procedure: Coronary/Graft Acute MI Revascularization;  Surgeon: SmBelva CromeMD;  [facility];  Service: Cardiovascular;  Laterality: N/A;  . LEFT HEART CATH AND CORONARY ANGIOGRAPHY N/A 08/09/2017   Procedure: LEFT HEART CATH AND CORONARY ANGIOGRAPHY;  Surgeon: SmBelva CromeMD;  [facility];  Service: Cardiovascular;  Laterality: N/A;  . LEFT HEART CATHETERIZATION WITH CORONARY ANGIOGRAM N/A 08/04/2012   Procedure: LEFT HEART CATHETERIZATION WITH CORONARY ANGIOGRAM;  Surgeon: ThTroy SineMD;  [facility];  Service: Cardiovascular;  Laterality: N/A;  . LEG SURGERY Left    rod placed for fracture repair  . PENILE PROSTHESIS IMPLANT N/A 06/03/2014   Procedure: IMPLANT PENILE PROTHESIS INFLATABLE;  Surgeon: SiAilene RudMD;  Location: [facility];  Service: Urology;  Laterality: N/A;  with penile block--0.5% marcaine plain  . PERCUTANEOUS CORONARY STENT INTERVENTION (PCI-S) Right 08/04/2012   Procedure: PERCUTANEOUS CORONARY STENT INTERVENTION (PCI-S);  Surgeon: ThTroy SineMD;  [facility];  Service: Cardiovascular;  Laterality: Right;  . THUMB ARTHROSCOPY Left    thumb joint replaced    Allergies  Allergen Reactions  . Hydrocodone Itching    Current Outpatient Medications  Medication Sig Dispense Refill  . albuterol (PROVENTIL HFA;VENTOLIN HFA) 108 (90 Base) MCG/ACT inhaler Inhale 2 puffs into the lungs every 6 (six) hours as needed for  wheezing or shortness of breath. 1 Inhaler 2  . albuterol (PROVENTIL) (2.5 MG/3ML) 0.083% nebulizer solution Take 5 mg by nebulization every 4 (four) hours as needed. For wheezing or SOB    . allopurinol (ZYLOPRIM) 300 MG tablet Take 300 mg by mouth daily.    . Marland Kitchenpixaban (ELIQUIS) 5 MG TABS tablet Take 1 tablet (5 mg total) by mouth 2 (two) times daily. 180 tablet 3  . atorvastatin (LIPITOR) 80 MG tablet Take 1  tablet (80 mg total) by mouth daily at 6 PM. 90 tablet 3  . buPROPion (WELLBUTRIN XL) 300 MG 24 hr tablet Take 300 mg by mouth daily.    . carvedilol (COREG) 6.25 MG tablet Take 6.25 mg (1 tablet) by mouth two times daily 180 tablet 3  . clonazePAM (KLONOPIN) 0.5 MG tablet Take 1 mg by mouth 2 (two) times daily.   0  . clopidogrel (PLAVIX) 75 MG tablet Take 1 tablet (75 mg total) by mouth daily. 90 tablet 3  . COLCRYS 0.6 MG tablet Take 0.6 mg by mouth daily.  3  . furosemide (LASIX) 20 MG tablet Take 1 tablet (20 mg total) by mouth daily. 90 tablet 3  . lisinopril (PRINIVIL,ZESTRIL) 2.5 MG tablet Take 1 tablet (2.5 mg total) by mouth daily. 90 tablet 3  . metFORMIN (GLUCOPHAGE-XR) 500 MG 24 hr tablet Take 500 mg by mouth daily.  3  . nitroGLYCERIN (NITROSTAT) 0.4 MG SL tablet Place 1 tablet (0.4 mg total) under the tongue every 5 (five) minutes x 3 doses as needed for chest pain. 25 tablet 2  . Omega-3 Fatty Acids (FISH OIL PO) Take 1 tablet by mouth daily.    [name] oxyCODONE-acetaminophen (PERCOCET) 10-325 MG per tablet Take 1 tablet by mouth 3 (three) times daily.     . potassium chloride (K-DUR,KLOR-CON) 10 MEQ tablet Take 1 tablet (10 mEq total) by mouth daily. 90 tablet 3  . sertraline (ZOLOFT) 100 MG tablet Take 100 mg by mouth daily.    . Tiotropium Bromide-Olodaterol (STIOLTO RESPIMAT) 2.5-2.5 MCG/ACT AERS Inhale 2 puffs into the lungs daily.     No current facility-administered medications for this visit.     Socially,  he is divorced. He doesn't walk but not routinely exercise.   He continues to smoke one pack of cigarettes per day.  ROS General: Negative; No fevers, chills, or night sweats;  HEENT: Negative; No changes in vision or hearing, sinus congestion, difficulty swallowing Pulmonary:  Positive for shortness of breath; No cough, wheezing, , hemoptysis Cardiovascular: See history of present illness GI: Negative; No nausea, vomiting, diarrhea, or abdominal pain GU: Negative; No dysuria, hematuria, or difficulty voiding Musculoskeletal: Negative; no myalgias, joint pain, or weakness Hematologic/Oncology: Negative; no easy bruising, bleeding Endocrine: Positive for diabetes no heat/cold intolerance;  Neuro: Negative; no changes in balance, headaches Skin: Negative; No rashes or skin lesions Psychiatric: Negative; No behavioral problems, depression Sleep: Negative; No snoring, daytime sleepiness, hypersomnolence, bruxism, restless legs, hypnogognic hallucinations, no cataplexy Other comprehensive 14 point system review is negative.   PE BP 122/72   Pulse 89   Ht 5' 9"  (1.753 m)   Wt 244 lb 3.2 oz (110.8 kg)   BMI 36.06 kg/m    Repeat blood pressure by me was 106/70  Wt Readings from Last 3 Encounters:  03/19/18 244 lb 3.2 oz (110.8 kg)  02/25/18 245 lb (111.1 kg)  02/21/18 245 lb (111.1 kg)   General: Alert, oriented, no distress.  Skin: normal turgor, no rashes, warm and dry HEENT: Normocephalic, atraumatic. Pupils equal round and reactive to light; sclera anicteric; extraocular muscles intact; Fundi 4 Nose without nasal septal hypertrophy Mouth/Parynx benign; Mallinpatti scale Neck: No JVD, no carotid bruits; normal carotid upstroke Lungs: clear to ausculatation and percussion; no wheezing or rales Chest wall: without tenderness to palpitation Heart: PMI not displaced, RRR, s1 s2 normal, 1/6 systolic murmur, no diastolic murmur, no rubs, gallops, thrills, or heaves Abdomen: soft, nontender; no hepatosplenomehaly, BS+; abdominal aorta  nontender  and not dilated by palpation. Back: no CVA tenderness Pulses 2+ Musculoskeletal: full range of motion, normal strength, no joint deformities Extremities: no clubbing cyanosis or edema, Homan's sign negative  Neurologic: grossly nonfocal; Cranial nerves grossly wnl Psychologic: Normal mood and affect   ECG (independently read by me): Normal sinus rhythm 89 bpm.  Right bundle branch block with repolarization changes.  Inferior Q waves and anterior  Q waves consistent with anterolateral infarct with possible inferior infarct  February 21, 2018 ECG (independently read by me): Normal sinus rhythm at 95 bpm, right bundle branch block with repolarization changes.  Anterolateral MI.  QTc interval 477 ms  Jan 24, 2018 ECG (independently read by me): Sinus rhythm at 97 bpm.  Right bundle branch block with repolarization changes.  QTc interval 472 ms.  Anterolateral Q waves  Jan 07, 2018 ECG (independently read by me): Normal sinus rhythm at 88 bpm.  Right bundle branch block with repolarization changes.  Inferior Q waves.  T wave anteroseptally, possible anteroseptal MI undetermined.  November 2017 ECG (independently read by me): Normal sinus rhythm at 64 bpm.  Branch block with repolarization changes.  QTc interval 460 ms.  June 2016 ECG (independently read by me): Sinus bradycardia at 57 bpm, right bundle branch block with repositioning changes.  QTc interval 459 ms.  September 2015 ECG (independently read by me and (: Normal sinus rhythm.  Right bundle branch block with repolarization changes.  Ventricular rate 81.  02/25/2014 ECG Normal sinus rhythm.  Right bundle branch block with repolarization changes.  PR interval 148 ms; QTc interval 463 ms  PriorECG: sinus rhythm at 76 beats per minute; right bundle-branch block with repolarization changes. PR interval 142 ms, QTC 481 ms.   LABS:  BMP Latest Ref Rng & Units 03/03/2018 02/21/2018 01/13/2018  Glucose 65 - 99 mg/dL 131(H) 89 124(H)  BUN 8 - 27  mg/dL 23 49(H) 28(H)  Creatinine 0.76 - 1.27 mg/dL 1.36(H) 2.70(H) 1.84(H)  BUN/Creat Ratio 10 - 24 17 18 15   Sodium 134 - 144 mmol/L 137 135 138  Potassium 3.5 - 5.2 mmol/L 4.4 5.1 4.8  Chloride 96 - 106 mmol/L 112(H) 103 104  CO2 20 - 29 mmol/L 22 19(L) 18(L)  Calcium 8.6 - 10.2 mg/dL 10.0 9.7 10.2   Hepatic Function Latest Ref Rng & Units 01/13/2018 08/20/2017 08/10/2017  Total Protein 6.0 - 8.5 g/dL 6.6 6.9 6.1(L)  Albumin 3.5 - 4.8 g/dL 4.2 3.7 3.4(L)  AST 0 - 40 IU/L 22 26 206(H)  ALT 0 - 44 IU/L 30 44 53  Alk Phosphatase 39 - 117 IU/L 129(H) 106 80  Total Bilirubin 0.0 - 1.2 mg/dL <0.2 0.4 0.5   CBC Latest Ref Rng & Units 02/19/2018 01/13/2018 08/20/2017  WBC 3.4 - 10.8 x10E3/uL 8.5 8.4 8.8  Hemoglobin 13.0 - 17.7 g/dL 12.5(L) 13.5 14.5  Hematocrit 37.5 - 51.0 % 37.2(L) 39.9 43.6  Platelets 150 - 450 x10E3/uL 186 218 251   Lab Results  Component Value Date   MCV 88 02/19/2018   MCV 88 01/13/2018   MCV 89.7 08/20/2017   Lab Results  Component Value Date   TSH 0.598 02/21/2018   Lipid Panel     Component Value Date/Time   CHOL 148 01/13/2018 0839   TRIG 265 (H) 01/13/2018 0839   HDL 36 (L) 01/13/2018 0839   CHOLHDL 4.1 01/13/2018 0839   CHOLHDL 4.8 08/09/2017 1117   VLDL 52 (H) 08/09/2017 1117   LDLCALC 59 01/13/2018  0839   INR result since initiation of Coumadin: 1:> 7.6;> >10;> 8.6 (Vit K administered) >4.3; no longer on Coumadin, now on Eliquis  RADIOLOGY: No results found.  IMPRESSION: No diagnosis found.  ASSESSMENT AND PLAN:  Mr. Broz is a 72 year old Caucasian male who suffered an acute coronary syndrome on 08/04/2012 when he presented with subtotal occlusion of his RCA. He had diffuse disease beyond the subtotal occlusion and ultimately had successful insertion of a 3.25x32 mm Promus DES stent. He had concomitant CAD with 40-50% proximal LAD narrowing, 30% circumflex marginal stenoses. NMR lipoprofile after initiation of atorvastatin in January 2014  showed marked improvement in total cholesterol of 119 LDL 48 LDL particle #992. HDL was very low at 30 with an increased triglyceride at 203 and significantly reduced HDL particle #24.2. His insulin resistance score was elevated at 65.  Prior to his evaluation with me on August 09, 2018, I had not seen him since November 2017.  He  suffered an anterior STEMI in December 2018 and underwent successful stenting and several days later he required staged intervention to circumflex and marginal vessel.  His initial echo Doppler study in December raise concerns for potential pre-thrombotic state.  His most recent echo Doppler study now is highly suggestive of apical thrombus.  Warfarin was initially prescribed but due to significant issues with compliance and overmedication the decision was made that he would be safer using Eliquis even though he is on this for apical thrombus rather than atrial fibrillation.  On exam today he is markedly more alert than he was at his last visit.  His medication adjustments been beneficial and his blood pressure today improved but when rechecked by me is still on the low side at 106/70.  As result I have recommended slight reduction of lisinopril from 5 mg down to 2.5 mg daily.  Since initiating Eliquis he continues to be on Plavix 75 mg with his recent stent but is not on aspirin therapy.  He has not had any bleeding issues.  He does not have significant edema on his current dose of furosemide.  He continues to be on atorvastatin 80 mg for hyperlipidemia with target LDL less than 70.  On Jan 13, 2018 LDL was 59.  I reviewed his sleep study in detail with him.  The present study demonstrates at least moderate sleep apnea but he was unable to achieve rem sleep during the evaluation.  CPAP initiated last evening.  I discussed with him the importance of compliance and again discussed the benefit of treating his sleep apnea with reference to his cardiovascular health.  I am referring him to  home health for medication management.  He is not having any anginal symptomatology.  I will see him in 2 months for follow-up evaluation since his CPAP initiation for face-to-face evaluation regarding CPAP compliance   Troy Sine, MD, [place]  03/19/2018 7:00 PM

## 2018-03-19 NOTE — Telephone Encounter (Signed)
Spoke with pt' sister and advised that per Dr. Claiborne Billings, he will review all medication at today's appointment scheduled at 1200 pm. Sister verbalized understanding.

## 2018-03-19 NOTE — Patient Instructions (Signed)
Medication Instructions:  DECREASE lisinopril to 2.5 mg daily  Follow-Up: Dr. Claiborne Billings between  8/15-10/13 (sleep clinic)  Any Other Special Instructions Will Be Listed Below (If Applicable).     If you need a refill on your cardiac medications before your next appointment, please call your pharmacy.

## 2018-03-21 ENCOUNTER — Encounter: Payer: Self-pay | Admitting: Cardiovascular Disease

## 2018-03-24 ENCOUNTER — Other Ambulatory Visit: Payer: Self-pay | Admitting: Family Medicine

## 2018-03-26 ENCOUNTER — Other Ambulatory Visit: Payer: Self-pay | Admitting: Family Medicine

## 2018-03-26 DIAGNOSIS — R29818 Other symptoms and signs involving the nervous system: Secondary | ICD-10-CM

## 2018-03-29 ENCOUNTER — Ambulatory Visit
Admission: RE | Admit: 2018-03-29 | Discharge: 2018-03-29 | Disposition: A | Discharge status: Home / Self Care | Payer: Medicare Other | Source: Ambulatory Visit | Attending: Family Medicine | Admitting: Family Medicine

## 2018-03-29 DIAGNOSIS — R29818 Other symptoms and signs involving the nervous system: Secondary | ICD-10-CM

## 2018-03-29 MED ORDER — GADOBENATE DIMEGLUMINE 529 MG/ML IV SOLN
20.0000 mL | Freq: Once | INTRAVENOUS | Status: AC | PRN
  Administered 2018-03-29: 20 mL via INTRAVENOUS

## 2018-04-01 NOTE — Addendum Note (Signed)
Addended by: Leland Johns A on: 04/01/2018 10:52 AM   Modules accepted: Orders

## 2018-05-23 ENCOUNTER — Telehealth: Payer: Self-pay | Admitting: Cardiovascular Disease

## 2018-05-23 ENCOUNTER — Telehealth: Payer: Self-pay | Admitting: *Deleted

## 2018-05-23 NOTE — Telephone Encounter (Signed)
Returned a call to patient's sister, [name]. She informs me the patient isn't using his CPAP machine. He complains about having a "sore spot" on top of his nose from the mask. ( patient apparently has a FF mask.) I told her that per the report in [facility] he has only attempted to wear it 8 out of the last 30 days.reinforced to her the importance of his being compliant. I told her that if the mask is the issue, I can give him a different mask to try. She states this would be great. She also asked if I would call patient to have him to come to the office and pick it up. I will call the patient and offer a different mask sample.

## 2018-05-23 NOTE — Telephone Encounter (Signed)
Patient came by the office and was given a Medium Respironics under the nose mask sample to try. 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 Patient was fitted with the mask in the office. When asked how the new mask felt he states that it felt fine. Not too loose nor too tight. He also mentioned that when he wakes up there's a significant amount of water in his mask. His humidity level was set on "2" which is normal. I turned it back to "1". Patient was told that it may need to be put back at "2" as the weather gets cooler and he has to turn the heat on in his home.  He was also advised that if he continues to have water in his mask to call back to let us know. We will then need to look for other causes. Patient voiced understanding. Message will be routed to Dr Claiborne Billings for review and any further recommendations.

## 2018-05-23 NOTE — Telephone Encounter (Signed)
Spoke with [name] on file. She sts that the pt has been having elevated BP reading over the last several weeks and a 3lb weight gain since 05/08/18. Pt is asymptomatic.  -She readings listed in the message below  She sts that the pt may have been using salt more than usual since it is Tomato season. The pt has an appt with Dr.Kelly on 9/25. He is taking his medications as prescribed.  Adv her to bring the pt BP cuff with her to that appt so that it can be checked for accuracy.  Bobby [name] questions about the pt's sleep supplies. Adv her that I will fwd a message for our sleep coordinator Bobby [name], CMA to call her.

## 2018-05-23 NOTE — Telephone Encounter (Signed)
Called patient to come to get mask. Got no answer on cell #. Called home # listed in chart. This is his sister's cell #. She will try to contact patient and let him know to come by for mask.

## 2018-05-23 NOTE — Telephone Encounter (Signed)
Pt c/o BP issue: STAT if pt c/o blurred vision, one-sided weakness or slurred speech  1. What are your last 5 BP readings?  9/15 am  148/90   80hr    Pm 187/91  85hr  98 o  9/16 am   162/96    87hr    Pm 160/87  87hr   9/17 am  178/96  82hr    Pm  189/87   82hr  9/18  Am 162/92   78hr    Pm   170/96   85hr  9/19  Am  157/97  78hr       2. Are you having any other symptoms (ex. Dizziness, headache, blurred vision, passed out)? NONE   3lbs since 9/5   3. What is your BP issue? HIGH

## 2018-05-23 NOTE — Telephone Encounter (Signed)
agree

## 2018-05-27 NOTE — Telephone Encounter (Signed)
Agree with reduction in humidification.  If he continues to have difficulty with the mask on his nose will need to change to a different mask style

## 2018-05-28 ENCOUNTER — Encounter: Payer: Self-pay | Admitting: Cardiovascular Disease

## 2018-05-28 ENCOUNTER — Telehealth: Payer: Self-pay | Admitting: Cardiovascular Disease

## 2018-05-28 ENCOUNTER — Ambulatory Visit (INDEPENDENT_AMBULATORY_CARE_PROVIDER_SITE_OTHER): Payer: Medicare Other | Admitting: Cardiovascular Disease

## 2018-05-28 VITALS — BP 122/71 | HR 76 | Ht 69.0 in | Wt 245.0 lb

## 2018-05-28 DIAGNOSIS — I255 Ischemic cardiomyopathy: Secondary | ICD-10-CM

## 2018-05-28 DIAGNOSIS — N183 Chronic kidney disease, stage 3 unspecified: Secondary | ICD-10-CM

## 2018-05-28 DIAGNOSIS — Z7901 Long term (current) use of anticoagulants: Secondary | ICD-10-CM

## 2018-05-28 DIAGNOSIS — I513 Intracardiac thrombosis, not elsewhere classified: Secondary | ICD-10-CM

## 2018-05-28 DIAGNOSIS — I2510 Atherosclerotic heart disease of native coronary artery without angina pectoris: Secondary | ICD-10-CM | POA: Diagnosis not present

## 2018-05-28 DIAGNOSIS — Z79899 Other long term (current) drug therapy: Secondary | ICD-10-CM

## 2018-05-28 DIAGNOSIS — I5022 Chronic systolic (congestive) heart failure: Secondary | ICD-10-CM

## 2018-05-28 DIAGNOSIS — G4733 Obstructive sleep apnea (adult) (pediatric): Secondary | ICD-10-CM | POA: Diagnosis not present

## 2018-05-28 DIAGNOSIS — E785 Hyperlipidemia, unspecified: Secondary | ICD-10-CM

## 2018-05-28 MED ORDER — LOSARTAN POTASSIUM 25 MG PO TABS: 25.0000 mg | ORAL_TABLET | Freq: Two times a day (BID) | ORAL | 3 refills | Status: DC

## 2018-05-28 NOTE — Telephone Encounter (Signed)
New message   Pt c/o medication issue:  1. Name of Medication:losartan (COZAAR) 25 MG tablet  2. How are you currently taking this medication (dosage and times per day)? Twice daily  3. Are you having a reaction (difficulty breathing--STAT)?no   4. What is your medication issue? Per the [facility] need a new script for this medication and it needs to be faxed to [phone].

## 2018-05-28 NOTE — Telephone Encounter (Signed)
Rx for Losartan 25mg  bid #180 R-3 faxed to pt's pharmacy [facility]  Fax # [phone]

## 2018-05-28 NOTE — Progress Notes (Signed)
[name], male   DOB: 1946/03/13, 72 y.o.   MRN: [id number]    HPI: Bobby [name] is a 72 y.o. male presents to Bobby office today for a 9 week followup evaluation.   Bobby [name]  has a long-standing tobacco history having started smoking at age 22, a history of hypertension, remote small TIA/CVA who presented to Bobby hospital on 08/04/2012 in Bobby setting of inferior ST segment elevation myocardial infarction. Catheterization by me revealed a 99% stenosis of Bobby RCA and concomitant CAD involving his LAD and circumflex vessels. He underwent acute percutaneous coronary intervention with an excellent door to balloon time of only 20 minutes and ultimately insertion of a 3.25x32 mm DES stent post dilated 3.3. An echo done in Bobby hospital showed an EF of 50-60% with mild basal inferior probable scar and moderate pulmonary hypertension with PA pressure of 45 mm.  When I saw Bobby [name] in May 2014, he did note shortness of breath with activity. He denied definitive chest pain. At that time, he realized that he had inadvertentlystopped taking Bobby atorvastatin 40 mg dose and this was resumed. In addition, his Toprol dose was increased from 50 to 75 mg daily. A nuclear study showed an ejection fraction of 66%. Perfusion was essentially normal with exception of a small region of fixed inferoseptal bowel artifact and possible small area of inferobasilar scar.   Bobby [name] unfortunately continues to smoke at least a pack of cigarettes per day.   He does experience shortness of breath with activity.  He denies recent chest pain.  He has chronic right bundle branch block with repolarization changes.  He is diabetic and has pulmonary hypertension.  A follow-up nuclear perfusion study on 03/26/2014 remianed low risk and showed normal LV function and normal wall motion.  There was a fixed inferior defect that was felt most consistent with diaphragmatic attenuation.  There was no evidence for ischemia.  He underwent  prostate surgery by Bobby [name] without cardiovascular compromise.    A screening abdominal aortic ultrasound revealed a normal abdominal aorta.  Since I  saw him in November 2017, he has had an extensive history and has had recurrent hospitalizations he was hospitalized in October 2018 for left arm pain.  Troponin was negative.  In December 2018 he suffered an anterior ST segment elevation MI due to acute occlusion of his mid LAD and underwent successful PCI of his LAD with insertion of a 2.5 x 22 mm Resolute Onyx DES stent.  He was also found to have 99% dominant mid left circumflex lesion with 80% stenosis in Bobby OM1 vessel and underwent staged intervention to Bobby mid circumflex and marginal vessels.  Troponin was greater than 65.  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  There was mention of sludge is a sign of pre-thrombotic state but no definite thrombus was seen at Bobby apex.  He returned to Bobby hospital August 20, 2017 with malaise and dyspnea.  He was bradycardic.  He was diuresed.  He has renal insufficiency with creatinines increasing up to 1.9.  He has been followed by Bobby [name] on numerous occasions in Bobby office.  Unfortunately continues to smoke cigarettes.  He admits to 1/2 pack/day.  He does note occasional dizziness as well as some shortness of breath.  He denies recurrent anginal type symptoms.   Suggestive of low flow state.  I saw him for  evaluation on Jan 07, 2018.  At that time, I recommended that he undergo a follow-up echo Doppler study to reassess systolic and diastolic function as well as potential for apical thrombus formation.  He had continued to be on aspirin and Brilinta following his STEMI.  He was on atorvastatin 80 mg for hyperlipidemia with target less than 70.  I had a long discussion regarding smoking cessation.  He underwent a follow-up echo Doppler study on Jan 20, 2018.  This showed reduced LV  function with an EF of 35 to 40% with akinesis of Bobby mid apical anterolateral and septal as well as apical walls.  There was grade 1 diastolic dysfunction.  There was now a possible small layered thrombus at Bobby apex noted using Definity and also evidence of swirling at Bobby LV apex suggestive of low flow state.   As result of Bobby echo findings, I saw him in follow-up on Jan 24, 2018 at which time I had a very long discussion with him guarding potential thromboembolic stroke risk.  He was started on warfarin anticoagulation.  Apparently, Bobby [name] medications have been in a punch pack and had been followed by his girlfriend.  Apparently Bobby [name] not been taking his medications correctly.  Bobby result is been significant over anticoagulation such that his INR reached greater than 10.  He has been followed very closely by our pharmacy department and he also was given vitamin K.  Due to his poor medication compliance after much discussion ultimately Bobby thought is for him to transition to Eliquis once his INR gets below 2.  He also has been taking his blood pressure medicines incorrectly.  [name]  In Bobby past I had recommended he undergo a sleep evaluation for sleep apnea which he never followed up with it was a no-show in Bobby sleep lab.  He was worked into my schedule on February 21, 2018 and was seen as an add-on due to his medication issues.  He was here now with his sister who in Bobby past had done an excellent job in caring for his medications and she is committed now to resume doing this for him.  During his last evaluation, he was exceptionally somnolent, and a significant time was spent with medication adjustment.  Due to his inability to take effect of warfarin Bobby decision was made to use Eliquis for anticoagulation with more consistent dosing.  His recent laboratory had shown stage IV chronic kidney disease and it was recommended that he hold his lisinopril for  several days and then reduce Bobby dose to 5 mg as well as holding his furosemide with subsequent reduction of dose with reinstitution at 20 mg.  I scheduled him for an expeditious sleep study due to concerns for significant sleep apnea.  Is also seen 5 days later by Racquel with medication adjustment and anticoagulation issues.  He underwent a sleep study on February 25, 2018 and he was found to have moderate overall sleep apnea with an AHI of 22.2 and RDI of 25.4.  He could not achieve any rem sleep and Bobby overall severity may very well be underestimated.  He was titrated up to 10 cm water pressure.  An initial trial of CPAP auto with an EPR range of 8-15 was recommended.  When I last saw him on March 19, 2018 he had he had just initiated CPAP Bobby evening before noted significant improvement in his sleep. His renal function had significantly  improved from a creatinine of 2.7 down to 1.362 weeks ago.  Since I last saw him, he has felt well.  He is unaware of any recurrent episodes of arrhythmia.  Apparently his blood pressure had become elevated and Dr. Darron Doom added losartan to his medical regimen.  Of note, he also already was on lisinopril 2.5 mg, furosemide 20 mg, carvedilol 6.25 mg twice a day.  He has been only intermittently using CPAP due to complaints of Bobby mask on his nose bridge.  As result, he was recently changed to a different mask.  A download was obtained in Bobby office today in which he does not meet compliance.  [employer] is his DME company.  His set up date was March 17, 2018 and he has until June 17, 2018 to demonstrate compliance.  He presents for evaluation. .  Past Medical History:  Diagnosis Date  . CAD (coronary artery disease),residual non obstructive disease    a. 08/2012 Inferior STEMI: RCA 99% (3.5x32 Promus DES), nonobs LAD/LCX dzs;  b. 01/2013 MV: EF 66%, small inferoseptal artifact and possible small inferobasilar scar;  c. 03/2014 MV: fixed inf defect->diaph attenuation, no  ischemia. 12/18 PCI/DES to mLAD, staged PCI/DES Lcx/OM  . DM (diabetes mellitus), type 2 new diagnosis 08/05/2012   diet control  . ED (erectile dysfunction)    surgery planned  . History of echocardiogram    a. 08/2012 Echo: EF 55-60%, possible mild basal inferior HK, mildly dil RA, PASP 1mHg.  .Marland KitchenHyperlipidemia 08/05/2012  . Hypertensive heart disease   . [place]   acute inferior stemi secondary to RCA occlusion; PCI  . Panic attack   . [place]    echo 08/05/12, EF 55-60%, PA pressure 429m . S/P coronary artery stent placement, to RCA Promus DES 08/05/2012  . [facility]   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  . Tobacco abuse     Past Surgical History:  Procedure Laterality Date  . CARDIAC CATHETERIZATION  08/04/12   PCI to RCA with DES  . CORONARY STENT INTERVENTION N/A 08/12/2017   Procedure: CORONARY STENT INTERVENTION;  Surgeon: JoMartiniquePeter M, MD;  [facility];  Service: Cardiovascular;  Laterality: N/A;  . CORONARY/GRAFT ACUTE MI REVASCULARIZATION N/A 08/09/2017   Procedure: Coronary/Graft Acute MI Revascularization;  Surgeon: SmBelva CromeMD;  [facility];  Service: Cardiovascular;  Laterality: N/A;  . LEFT HEART CATH AND CORONARY ANGIOGRAPHY N/A 08/09/2017   Procedure: LEFT HEART CATH AND CORONARY ANGIOGRAPHY;  Surgeon: SmBelva CromeMD;  [facility];  Service: Cardiovascular;  Laterality: N/A;  . LEFT HEART CATHETERIZATION WITH CORONARY ANGIOGRAM N/A 08/04/2012   Procedure: LEFT HEART CATHETERIZATION WITH CORONARY ANGIOGRAM;  Surgeon: ThTroy SineMD;  [facility];  Service: Cardiovascular;  Laterality: N/A;  . LEG SURGERY Left    rod placed for fracture repair  . PENILE PROSTHESIS IMPLANT N/A 06/03/2014   Procedure: IMPLANT PENILE PROTHESIS INFLATABLE;  Surgeon: SiAilene RudMD;  Location: [facility];  Service: Urology;  Laterality: N/A;  with penile block--0.5%  marcaine plain  . PERCUTANEOUS CORONARY STENT INTERVENTION (PCI-S) Right 08/04/2012   Procedure: PERCUTANEOUS CORONARY STENT INTERVENTION (PCI-S);  Surgeon: ThTroy SineMD;  [facility];  Service: Cardiovascular;  Laterality: Right;  . THUMB ARTHROSCOPY Left    thumb joint replaced    Allergies  Allergen Reactions  . Hydrocodone Itching    Current Outpatient  Medications  Medication Sig Dispense Refill  . albuterol (PROVENTIL HFA;VENTOLIN HFA) 108 (90 Base) MCG/ACT inhaler Inhale 2 puffs into Bobby lungs every 6 (six) hours as needed for wheezing or shortness of breath. 1 Inhaler 2  . albuterol (PROVENTIL) (2.5 MG/3ML) 0.083% nebulizer solution Take 5 mg by nebulization every 4 (four) hours as needed. For wheezing or SOB    . allopurinol (ZYLOPRIM) 300 MG tablet Take 300 mg by mouth daily.    [name] apixaban (ELIQUIS) 5 MG TABS tablet Take 1 tablet (5 mg total) by mouth 2 (two) times daily. 180 tablet 3  . atorvastatin (LIPITOR) 80 MG tablet Take 1 tablet (80 mg total) by mouth daily at 6 PM. 90 tablet 3  . buPROPion (WELLBUTRIN XL) 300 MG 24 hr tablet Take 300 mg by mouth daily.    . carvedilol (COREG) 6.25 MG tablet Take 6.25 mg (1 tablet) by mouth two times daily 180 tablet 3  . clonazePAM (KLONOPIN) 0.5 MG tablet Take 1 mg by mouth 2 (two) times daily.   0  . clopidogrel (PLAVIX) 75 MG tablet Take 1 tablet (75 mg total) by mouth daily. 90 tablet 3  . COLCRYS 0.6 MG tablet Take 0.6 mg by mouth daily.  3  . furosemide (LASIX) 20 MG tablet Take 1 tablet (20 mg total) by mouth daily. 90 tablet 3  . metFORMIN (GLUCOPHAGE-XR) 500 MG 24 hr tablet Take 500 mg by mouth daily.  3  . nitroGLYCERIN (NITROSTAT) 0.4 MG SL tablet Place 1 tablet (0.4 mg total) under Bobby tongue every 5 (five) minutes x 3 doses as needed for chest pain. 25 tablet 2  . Omega-3 Fatty Acids (FISH OIL PO) Take 1 tablet by mouth daily.    [name] oxyCODONE-acetaminophen (PERCOCET) 10-325 MG per tablet Take 1 tablet by mouth  3 (three) times daily.     . potassium chloride (K-DUR,KLOR-CON) 10 MEQ tablet Take 1 tablet (10 mEq total) by mouth daily. 90 tablet 3  . sertraline (ZOLOFT) 100 MG tablet Take 100 mg by mouth daily.    . Tiotropium Bromide-Olodaterol (STIOLTO RESPIMAT) 2.5-2.5 MCG/ACT AERS Inhale 2 puffs into Bobby lungs daily.    [name] losartan (COZAAR) 25 MG tablet Take 1 tablet (25 mg total) by mouth 2 (two) times daily. 180 tablet 3   No current facility-administered medications for this visit.     Socially,  he is divorced. He doesn't walk but not routinely exercise.  He continues to smoke one pack of cigarettes per day.  ROS General: Negative; No fevers, chills, or night sweats;  HEENT: Negative; No changes in vision or hearing, sinus congestion, difficulty swallowing Pulmonary:  Positive for shortness of breath; No cough, wheezing, , hemoptysis Cardiovascular: See history of present illness GI: Negative; No nausea, vomiting, diarrhea, or abdominal pain GU: Negative; No dysuria, hematuria, or difficulty voiding Musculoskeletal: Negative; no myalgias, joint pain, or weakness Hematologic/Oncology: Negative; no easy bruising, bleeding Endocrine: Positive for diabetes no heat/cold intolerance;  Neuro: Negative; no changes in balance, headaches Skin: Negative; No rashes or skin lesions Psychiatric: Negative; No behavioral problems, depression Sleep: As it of her obstructive sleep apnea, CPAP initiated March 17, 2018. no bruxism, restless legs, hypnogognic hallucinations, no cataplexy Other comprehensive 14 point system review is negative.   PE BP 122/71   Pulse 76   Ht '5\' 9"'$  (1.753 m)   Wt 245 lb (111.1 kg)   BMI 36.18 kg/m    Repeat blood pressure by me 124/70  Wt Readings from  Last 3 Encounters:  05/28/18 245 lb (111.1 kg)  03/19/18 244 lb 3.2 oz (110.8 kg)  02/25/18 245 lb (111.1 kg)   General: Alert, oriented, no distress.  Skin: normal turgor, no rashes, warm and dry HEENT:  Normocephalic, atraumatic. Pupils equal round and reactive to light; sclera anicteric; extraocular muscles intact;  Nose without nasal septal hypertrophy Mouth/Parynx benign; Mallinpatti scale 3/4 Neck: No JVD, no carotid bruits; normal carotid upstroke Lungs: clear to ausculatation and percussion; no wheezing or rales Chest wall: without tenderness to palpitation Heart: PMI not displaced, RRR, s1 s2 normal, 1/6 systolic murmur, no diastolic murmur, no rubs, gallops, thrills, or heaves Abdomen: soft, nontender; no hepatosplenomehaly, BS+; abdominal aorta nontender and not dilated by palpation. Back: no CVA tenderness Pulses 2+ Musculoskeletal: full range of motion, normal strength, no joint deformities Extremities: no clubbing cyanosis or edema, Homan's sign negative  Neurologic: grossly nonfocal; Cranial nerves grossly wnl Psychologic: Normal mood and affect   ECG (independently read by me): Normal sinus rhythm with sinus arrhythmia.  Right bundle branch block with repolarization changes.  Q waves consistent with anteroseptal MI and possible inferior MI no indication to adjust Bobby pain upon his needs  July 17,2019 ECG (independently read by me): Normal sinus rhythm 89 bpm.  Right bundle branch block with repolarization changes.  Inferior Q waves and anterior  Q waves consistent with anterolateral infarct with possible inferior infarct  February 21, 2018 ECG (independently read by me): Normal sinus rhythm at 95 bpm, right bundle branch block with repolarization changes.  Anterolateral MI.  QTc interval 477 ms  Jan 24, 2018 ECG (independently read by me): Sinus rhythm at 97 bpm.  Right bundle branch block with repolarization changes.  QTc interval 472 ms.  Anterolateral Q waves  Jan 07, 2018 ECG (independently read by me): Normal sinus rhythm at 88 bpm.  Right bundle branch block with repolarization changes.  Inferior Q waves.  T wave anteroseptally, possible anteroseptal MI  undetermined.  November 2017 ECG (independently read by me): Normal sinus rhythm at 64 bpm.  Branch block with repolarization changes.  QTc interval 460 ms.  June 2016 ECG (independently read by me): Sinus bradycardia at 57 bpm, right bundle branch block with repositioning changes.  QTc interval 459 ms.  September 2015 ECG (independently read by me and (: Normal sinus rhythm.  Right bundle branch block with repolarization changes.  Ventricular rate 81.  02/25/2014 ECG Normal sinus rhythm.  Right bundle branch block with repolarization changes.  PR interval 148 ms; QTc interval 463 ms  PriorECG: sinus rhythm at 76 beats per minute; right bundle-branch block with repolarization changes. PR interval 142 ms, QTC 481 ms.   LABS:  BMP Latest Ref Rng & Units 03/03/2018 02/21/2018 01/13/2018  Glucose 65 - 99 mg/dL 131(H) 89 124(H)  BUN 8 - 27 mg/dL 23 49(H) 28(H)  Creatinine 0.76 - 1.27 mg/dL 1.36(H) 2.70(H) 1.84(H)  BUN/Creat Ratio 10 - '24 17 18 15  '$ Sodium 134 - 144 mmol/L 137 135 138  Potassium 3.5 - 5.2 mmol/L 4.4 5.1 4.8  Chloride 96 - 106 mmol/L 112(H) 103 104  CO2 20 - 29 mmol/L 22 19(L) 18(L)  Calcium 8.6 - 10.2 mg/dL 10.0 9.7 10.2   Hepatic Function Latest Ref Rng & Units 01/13/2018 08/20/2017 08/10/2017  Total Protein 6.0 - 8.5 g/dL 6.6 6.9 6.1(L)  Albumin 3.5 - 4.8 g/dL 4.2 3.7 3.4(L)  AST 0 - 40 IU/L 22 26 206(H)  ALT 0 - 44 IU/L 30  44 53  Alk Phosphatase 39 - 117 IU/L 129(H) 106 80  Total Bilirubin 0.0 - 1.2 mg/dL <0.2 0.4 0.5   CBC Latest Ref Rng & Units 02/19/2018 01/13/2018 08/20/2017  WBC 3.4 - 10.8 x10E3/uL 8.5 8.4 8.8  Hemoglobin 13.0 - 17.7 g/dL 12.5(L) 13.5 14.5  Hematocrit 37.5 - 51.0 % 37.2(L) 39.9 43.6  Platelets 150 - 450 x10E3/uL 186 218 251   Lab Results  Component Value Date   MCV 88 02/19/2018   MCV 88 01/13/2018   MCV 89.7 08/20/2017   Lab Results  Component Value Date   TSH 0.598 02/21/2018   Lipid Panel     Component Value Date/Time   CHOL 148  01/13/2018 0839   TRIG 265 (H) 01/13/2018 0839   HDL 36 (L) 01/13/2018 0839   CHOLHDL 4.1 01/13/2018 0839   CHOLHDL 4.8 08/09/2017 1117   VLDL 52 (H) 08/09/2017 1117   LDLCALC 59 01/13/2018 0839   INR result since initiation of Coumadin: 1:> 7.6;> >10;> 8.6 (Vit K administered) >4.3; no longer on Coumadin, now on Eliquis  RADIOLOGY: No results found.  IMPRESSION: 1. Chronic systolic heart failure ([place])   2. OSA (obstructive sleep apnea)   3. CAD in native artery   4. Ischemic cardiomyopathy   5. Apical mural thrombus   6. Long term (current) use of anticoagulants   7. Hyperlipidemia with target LDL less than 70   8. Medication management   9. CKD (chronic kidney disease), stage III [facility])     ASSESSMENT AND PLAN:  Mr. Savoca is a 72 year old Caucasian male who suffered an acute coronary syndrome on 08/04/2012 when he presented with subtotal occlusion of his RCA. He had diffuse disease beyond Bobby subtotal occlusion and ultimately had successful insertion of a 3.25x32 mm Promus DES stent. He had concomitant CAD with 40-50% proximal LAD narrowing, 30% circumflex marginal stenoses. NMR lipoprofile after initiation of atorvastatin in January 2014 showed marked improvement in total cholesterol of 119 LDL 48 LDL particle #992. HDL was very low at 30 with an increased triglyceride at 203 and significantly reduced HDL particle #24.2. His insulin resistance score was elevated at 65.  [name]  He  suffered an anterior STEMI in December 2018 and underwent successful stenting and several days later he required staged intervention to circumflex and marginal vessel.  His initial echo Doppler study in December raised concerns for potential pre-thrombotic state.  His most recent echo Doppler study in May 2019 was highly suggestive of apical thrombus.  Warfarin was initially prescribed but due to significant issues with compliance and overmedication Bobby decision was made that he would be safer using Eliquis even  though he is on this for apical thrombus rather than atrial fibrillation.  Presently, he has been without recurrent anginal symptoms with reference to his CAD.  He continues to be on Eliquis 5 mg twice a day and Plavix and is no longer on aspirin.  Apparently he was recently started on losartan by Dr. Darron Doom.  I have suggested he discontinue lisinopril and will therefore further titrate losartan to 25 mg twice a day both for blood pressure and for his reduced LV function.  He continues to be on atorvastatin 80 mg for hyperlipidemia.  LDL was 59 in May 2019.  I had a long discussion with him regarding his obstructive sleep apnea.  He is poorly compliant.  We discussed Bobby absolute importance of reaching Bobby necessary compliance threshold.  I again discussed Bobby adverse consequences of  keeping his sleep apnea untreated.  He now has a new mask which does not put pressure on Bobby base of his nose.  He is set on a CPAP auto with a range of 8 to 15 cm.  He is not having any bleeding on his anticoagulation.  I will see him in 3 weeks for reevaluation.   Time spent: 30 minutes   Troy Sine, MD, [facility]  05/30/2018 6:56 PM

## 2018-05-28 NOTE — Patient Instructions (Signed)
Medication Instructions:  STOP lisinopril INCREASE losartan to 25 mg two times daily  Labwork: Please return for labs in 1-2 weeks (BMET)  Our in office lab hours are Monday-Friday 8:00-4:00, closed for lunch 12:45-1:45 pm.  No appointment needed.  Follow-Up: 10/14 at 10 AM with Dr. Claiborne Billings for sleep compliance  Any Other Special Instructions Will Be Listed Below (If Applicable).     If you need a refill on your cardiac medications before your next appointment, please call your pharmacy.

## 2018-05-30 ENCOUNTER — Encounter: Payer: Self-pay | Admitting: Cardiovascular Disease

## 2018-06-03 ENCOUNTER — Telehealth: Payer: Self-pay | Admitting: Cardiovascular Disease

## 2018-06-03 NOTE — Telephone Encounter (Signed)
If blood pressure remains elevated, increase losartan to 50 mg in the morning and 25 mg at night

## 2018-06-03 NOTE — Telephone Encounter (Signed)
New message:    Pt's sister is calling and was told to give [place] some BP readings to see how the new medication is going  06/01/18 Am: 172/94 Pm:148/84  06/02/18 Am:174/92 Pm:158/83  06/03/18 Am:169/93

## 2018-06-03 NOTE — Telephone Encounter (Signed)
The patients sister Olegario Shearer on [place])  called to report that she has a neighbor going to the pts house twice a day to give him his meds at 9am and 6pm.. She was told to record his BP readings since increasing the Losartan to 25mg  bid on 05/28/18 and stopping the Lisinopril.   The readings and the times he is taking his meds are the same;  9/19  Am 172/94  Pm 148/84 9/30  Am 174/92  Pm 158/83 10/1  Am 169/93  Pt is asymptomatic... He is seeing Dr. Claiborne Billings back on 06/16/18.. I advised her that I will talk with Dr. Claiborne Billings to see if he would like to make any changes prior to that appointment... She says that she can have the neighbor check the BP at different times of the day other than the times he is due for his meds if he would prefer.

## 2018-06-04 NOTE — Telephone Encounter (Signed)
Spoke to sister (ok per DPR)-she states patients BP was better this AM. Unsure of reading as neighbor is taking it.   She will check to see what this is and if the readings are before or after medication as she is unsure.  She will call back with readings.

## 2018-06-06 MED ORDER — LOSARTAN POTASSIUM 25 MG PO TABS: ORAL_TABLET | ORAL | 3 refills | Status: DC

## 2018-06-06 NOTE — Telephone Encounter (Signed)
Returned call to sister (ok per DPR)-advised per Dr. Claiborne Billings increase losartan to 50 mg in the AM and 25 mg in the PM.   Sister aware and verbalized understanding.   She will call in a few weeks with updated BP readings.

## 2018-06-06 NOTE — Telephone Encounter (Signed)
Follow up    Patients sister is calling to provide BP readings   10/01  9am before meal 169/93 evening 151/81 10/02 9am before meal 175/94 evening 163/79 10/3 9am before meal 188/96 evening 174/96 10/4 9am before meal 184/96

## 2018-06-12 ENCOUNTER — Telehealth: Payer: Self-pay | Admitting: Cardiovascular Disease

## 2018-06-12 NOTE — Telephone Encounter (Signed)
New message   Pt c/o BP issue: STAT if pt c/o blurred vision, one-sided weakness or slurred speech  1. What are your last 5 BP readings? 06/09/2018 150/84 bp   153/81bp  06/10/2018 176/94 bp 176/96 bp 06/11/18 155/94bp 164/92 bp   2. Are you having any other symptoms (ex. Dizziness, headache, blurred vision, passed out)? No   3. What is your BP issue? Patient's sister states that his bp is elevated.

## 2018-06-12 NOTE — Telephone Encounter (Signed)
Returned call to patient's sister Olegario Shearer she stated Hayley wanted her to send brother's B/P readings.Message sent to [facility] and Hayley.

## 2018-06-13 NOTE — Telephone Encounter (Signed)
Will see pt next week and re-evaluate

## 2018-06-13 NOTE — Telephone Encounter (Signed)
Follow Up:    Please call,sister says she would like to talk to you.

## 2018-06-16 ENCOUNTER — Ambulatory Visit (INDEPENDENT_AMBULATORY_CARE_PROVIDER_SITE_OTHER): Payer: Medicare Other | Admitting: Cardiovascular Disease

## 2018-06-16 ENCOUNTER — Encounter: Payer: Self-pay | Admitting: Cardiovascular Disease

## 2018-06-16 VITALS — BP 140/82 | HR 84 | Ht 69.0 in | Wt 247.0 lb

## 2018-06-16 DIAGNOSIS — I10 Essential (primary) hypertension: Secondary | ICD-10-CM

## 2018-06-16 DIAGNOSIS — G4733 Obstructive sleep apnea (adult) (pediatric): Secondary | ICD-10-CM

## 2018-06-16 DIAGNOSIS — I513 Intracardiac thrombosis, not elsewhere classified: Secondary | ICD-10-CM | POA: Diagnosis not present

## 2018-06-16 DIAGNOSIS — I255 Ischemic cardiomyopathy: Secondary | ICD-10-CM

## 2018-06-16 DIAGNOSIS — Z7901 Long term (current) use of anticoagulants: Secondary | ICD-10-CM

## 2018-06-16 DIAGNOSIS — E785 Hyperlipidemia, unspecified: Secondary | ICD-10-CM

## 2018-06-16 DIAGNOSIS — I2510 Atherosclerotic heart disease of native coronary artery without angina pectoris: Secondary | ICD-10-CM | POA: Diagnosis not present

## 2018-06-16 MED ORDER — LOSARTAN POTASSIUM 50 MG PO TABS: ORAL_TABLET | ORAL | 3 refills | Status: DC

## 2018-06-16 NOTE — Progress Notes (Signed)
Patient ID: Bobby [name], male   DOB: [birth year], 72 y.o.   MRN: [id number]    HPI: Bobby [name] is a 72 y.o. male presents to the office today for a 3 week followup evaluation.   Bobby [name]  has a long-standing tobacco history having started smoking at age 1, a history of hypertension, remote small TIA/CVA who presented to the hospital on 08/04/2012 in the setting of inferior ST segment elevation myocardial infarction. Catheterization by me revealed a 99% stenosis of the RCA and concomitant CAD involving his LAD and circumflex vessels. He underwent acute percutaneous coronary intervention with an excellent door to balloon time of only 20 minutes and ultimately insertion of a 3.25x32 mm DES stent post dilated 3.3. An echo done in the hospital showed an EF of 50-60% with mild basal inferior probable scar and moderate pulmonary hypertension with PA pressure of 45 mm.  When I saw Bobby [name] in May 2014, he did note shortness of breath with activity. He denied definitive chest pain. At that time, he realized that he had inadvertentlystopped taking the atorvastatin 40 mg dose and this was resumed. In addition, his Toprol dose was increased from 50 to 75 mg daily. A nuclear study showed an ejection fraction of 66%. Perfusion was essentially normal with exception of a small region of fixed inferoseptal bowel artifact and possible small area of inferobasilar scar.   Bobby [name] unfortunately continues to smoke at least a pack of cigarettes per day.   He does experience shortness of breath with activity.  He denies recent chest pain.  He has chronic right bundle branch block with repolarization changes.  He is diabetic and has pulmonary hypertension.  A follow-up nuclear perfusion study on 03/26/2014 remianed low risk and showed normal LV function and normal wall motion.  There was a fixed inferior defect that was felt most consistent with diaphragmatic attenuation.  There was no evidence for ischemia.  He underwent  prostate surgery by Dr. Gaynelle [name] without cardiovascular compromise.    A screening abdominal aortic ultrasound revealed a normal abdominal aorta.  Since I  saw him in November 2017, he has had an extensive history and has had recurrent hospitalizations he was hospitalized in October 2018 for left arm pain.  Troponin was negative.  In December 2018 he suffered an anterior ST segment elevation MI due to acute occlusion of his mid LAD and underwent successful PCI of his LAD with insertion of a 2.5 x 22 mm Resolute Onyx DES stent.  He was also found to have 99% dominant mid left circumflex lesion with 80% stenosis in the OM1 vessel and underwent staged intervention to the mid circumflex and marginal vessels.  Troponin was greater than 65.  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  There was mention of sludge is a sign of pre-thrombotic state but no definite thrombus was seen at the apex.  He returned to the hospital August 20, 2017 with malaise and dyspnea.  He was bradycardic.  He was diuresed.  He has renal insufficiency with creatinines increasing up to 1.9.  He has been followed by Bobby [name] on numerous occasions in the office.  Unfortunately continues to smoke cigarettes.  He admits to 1/2 pack/day.  He does note occasional dizziness as well as some shortness of breath.  He denies recurrent anginal type symptoms.   Suggestive of low flow state.  I saw him for  evaluation on Jan 07, 2018.  At that time, I recommended that he undergo a follow-up echo Doppler study to reassess systolic and diastolic function as well as potential for apical thrombus formation.  He had continued to be on aspirin and Brilinta following his STEMI.  He was on atorvastatin 80 mg for hyperlipidemia with target less than 70.  I had a long discussion regarding smoking cessation.  He underwent a follow-up echo Doppler study on Jan 20, 2018.  This showed reduced LV  function with an EF of 35 to 40% with akinesis of the mid apical anterolateral and septal as well as apical walls.  There was grade 1 diastolic dysfunction.  There was now a possible small layered thrombus at the apex noted using Definity and also evidence of swirling at the LV apex suggestive of low flow state.   As result of the echo findings, I saw him in follow-up on Jan 24, 2018 at which time I had a very long discussion with him guarding potential thromboembolic stroke risk.  He was started on warfarin anticoagulation.  Apparently, the patient's medications have been in a punch pack and had been followed by his girlfriend.  Apparently the patient has not been taking his medications correctly.  The result is been significant over anticoagulation such that his INR reached greater than 10.  He has been followed very closely by our pharmacy department and he also was given vitamin K.  Due to his poor medication compliance after much discussion ultimately the thought is for him to transition to Eliquis once his INR gets below 2.  He also has been taking his blood pressure medicines incorrectly.  Patient has been sleeping most of the day.  In the past I had recommended he undergo a sleep evaluation for sleep apnea which he never followed up with it was a no-show in the sleep lab.  He was worked into my schedule on February 21, 2018 and was seen as an add-on due to his medication issues.  He was here now with his sister who in the past had done an excellent job in caring for his medications and she is committed now to resume doing this for him.  During his last evaluation, he was exceptionally somnolent, and a significant time was spent with medication adjustment.  Due to his inability to take effect of warfarin the decision was made to use Eliquis for anticoagulation with more consistent dosing.  His recent laboratory had shown stage IV chronic kidney disease and it was recommended that he hold his lisinopril for  several days and then reduce the dose to 5 mg as well as holding his furosemide with subsequent reduction of dose with reinstitution at 20 mg.  I scheduled him for an expeditious sleep study due to concerns for significant sleep apnea.  Is also seen 5 days later by Racquel with medication adjustment and anticoagulation issues.  He underwent a sleep study on February 25, 2018 and he was found to have moderate overall sleep apnea with an AHI of 22.2 and RDI of 25.4.  He could not achieve any rem sleep and the overall severity may very well be underestimated.  He was titrated up to 10 cm water pressure.  An initial trial of CPAP auto with an EPR range of 8-15 was recommended.  When I last saw him on March 19, 2018 he had he had just initiated CPAP the evening before noted significant improvement in his sleep. His renal function had significantly  improved from a creatinine of 2.7 down to 1.362 weeks ago.  He is unaware of any recurrent episodes of arrhythmia.  Apparently his blood pressure had become elevated and Dr. Darron Doom added losartan to his medical regimen.  Of note, he also already was on lisinopril 2.5 mg, furosemide 20 mg, carvedilol 6.25 mg twice a day.  He has been only intermittently using CPAP due to complaints of the mask on his nose bridge.  As result, he was recently changed to a different mask.  A download was obtained in the office today in which he does not meet compliance.  [employer] is his DME company.  His set up date was March 17, 2018 and he has until June 17, 2018 to demonstrate compliance.    When I last saw him in September 2019 his blood pressure was elevated.  At that time apparently he was on both the previous lisinopril and the recent losartan that was started by Dr. Darron Doom.  I suggested he discontinue lisinopril and further titrated losartan to 25 mg twice a day both for improved blood pressure and for his reduced LV function.  He was not compliant with reference to his CPAP therapy  and I had a long discussion with him regarding the importance that he meet compliance by October 15.  Over the past several weeks, Bobby [name] not been utilizing CPAP with compliance.  I obtained a new download in the office today since September 21 he is only use CPAP 4 days.  He brought with him blood pressure logs which were taken by his sister.  These have shown his blood pressure to be consistently elevated over the past several weeks.  He has been on losartan 25 mg twice a day, carvedilol 6.25 twice a day.  He continues to be on Eliquis 5 mg twice a day.  He has been taking Lasix 20 mg daily.  He is diabetic on metformin.  He presents for reevaluation.  Past Medical History:  Diagnosis Date  . CAD (coronary artery disease),residual non obstructive disease    a. 08/2012 Inferior STEMI: RCA 99% (3.5x32 Promus DES), nonobs LAD/LCX dzs;  b. 01/2013 MV: EF 66%, small inferoseptal artifact and possible small inferobasilar scar;  c. 03/2014 MV: fixed inf defect->diaph attenuation, no ischemia. 12/18 PCI/DES to mLAD, staged PCI/DES Lcx/OM  . DM (diabetes mellitus), type 2 new diagnosis 08/05/2012   diet control  . ED (erectile dysfunction)    surgery planned  . History of echocardiogram    a. 08/2012 Echo: EF 55-60%, possible mild basal inferior HK, mildly dil RA, PASP 28mHg.  .Marland KitchenHyperlipidemia 08/05/2012  . Hypertensive heart disease   . [place] [place]   acute inferior stemi secondary to RCA occlusion; PCI  . Panic attack   . [facility] [place]    echo 08/05/12, EF 55-60%, PA pressure 449m . S/P coronary artery stent placement, to RCA Promus DES 08/05/2012  [facility]   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  . Tobacco abuse     Past Surgical History:  Procedure Laterality Date  . CARDIAC CATHETERIZATION  08/04/12   PCI to RCA with DES  . CORONARY STENT INTERVENTION N/A 08/12/2017   Procedure: CORONARY STENT INTERVENTION;  Surgeon: JoMartiniquePeter M, MD;  [facility];  Service: Cardiovascular;  Laterality: N/A;  . CORONARY/GRAFT ACUTE MI REVASCULARIZATION N/A 08/09/2017   Procedure: Coronary/Graft Acute MI Revascularization;  Surgeon: SmBelva CromeMD;  [facility];  Service: Cardiovascular;  Laterality: N/A;  . LEFT HEART CATH AND CORONARY ANGIOGRAPHY N/A 08/09/2017   Procedure: LEFT HEART CATH AND CORONARY ANGIOGRAPHY;  Surgeon: Belva Crome, MD;  Location: [place] CV LAB;  Service: Cardiovascular;  Laterality: N/A;  . LEFT HEART CATHETERIZATION WITH CORONARY ANGIOGRAM N/A 08/04/2012   Procedure: LEFT HEART CATHETERIZATION WITH CORONARY ANGIOGRAM;  Surgeon: Troy Sine, MD;  Location: [facility] CATH LAB;  Service: Cardiovascular;  Laterality: N/A;  . LEG SURGERY Left    rod placed for fracture repair  . PENILE PROSTHESIS IMPLANT N/A 06/03/2014   Procedure: IMPLANT PENILE PROTHESIS INFLATABLE;  Surgeon: Ailene Rud, MD;  Location: [facility];  Service: Urology;  Laterality: N/A;  with penile block--0.5% marcaine plain  . PERCUTANEOUS CORONARY STENT INTERVENTION (PCI-S) Right 08/04/2012   Procedure: PERCUTANEOUS CORONARY STENT INTERVENTION (PCI-S);  Surgeon: Troy Sine, MD;  [facility];  Service: Cardiovascular;  Laterality: Right;  . THUMB ARTHROSCOPY Left    thumb joint replaced    Allergies  Allergen Reactions  . Hydrocodone Itching    Current Outpatient Medications  Medication Sig Dispense Refill  . albuterol (PROVENTIL HFA;VENTOLIN HFA) 108 (90 Base) MCG/ACT inhaler Inhale 2 puffs into the lungs every 6 (six) hours as needed for wheezing or shortness of breath. 1 Inhaler 2  . albuterol (PROVENTIL) (2.5 MG/3ML) 0.083% nebulizer solution Take 5 mg by nebulization every 4 (four) hours as needed. For wheezing or SOB    . allopurinol (ZYLOPRIM) 300 MG tablet Take 300 mg by mouth daily.    [name] apixaban (ELIQUIS) 5 MG TABS tablet Take 1 tablet (5 mg total) by mouth 2 (two) times daily. 180 tablet 3  .  atorvastatin (LIPITOR) 80 MG tablet Take 1 tablet (80 mg total) by mouth daily at 6 PM. 90 tablet 3  . buPROPion (WELLBUTRIN XL) 300 MG 24 hr tablet Take 300 mg by mouth daily.    . carvedilol (COREG) 6.25 MG tablet Take 6.25 mg (1 tablet) by mouth two times daily 180 tablet 3  . clonazePAM (KLONOPIN) 0.5 MG tablet Take 1 mg by mouth 2 (two) times daily.   0  . clopidogrel (PLAVIX) 75 MG tablet Take 1 tablet (75 mg total) by mouth daily. 90 tablet 3  . COLCRYS 0.6 MG tablet Take 0.6 mg by mouth daily.  3  . furosemide (LASIX) 20 MG tablet Take 1 tablet (20 mg total) by mouth daily. 90 tablet 3  . metFORMIN (GLUCOPHAGE-XR) 500 MG 24 hr tablet Take 500 mg by mouth daily.  3  . nitroGLYCERIN (NITROSTAT) 0.4 MG SL tablet Place 1 tablet (0.4 mg total) under the tongue every 5 (five) minutes x 3 doses as needed for chest pain. 25 tablet 2  . Omega-3 Fatty Acids (FISH OIL PO) Take 1 tablet by mouth daily.    [name] oxyCODONE-acetaminophen (PERCOCET) 10-325 MG per tablet Take 1 tablet by mouth 3 (three) times daily.     . potassium chloride (K-DUR,KLOR-CON) 10 MEQ tablet Take 1 tablet (10 mEq total) by mouth daily. 90 tablet 3  . sertraline (ZOLOFT) 100 MG tablet Take 100 mg by mouth daily.    . Tiotropium Bromide-Olodaterol (STIOLTO RESPIMAT) 2.5-2.5 MCG/ACT AERS Inhale 2 puffs into the lungs daily.    [name] losartan (COZAAR) 50 MG tablet Take 1 tablet (50 mg total) by mouth every morning AND 1 tablet (50 mg total) every evening. 180 tablet 3   No current facility-administered medications for this visit.  Socially,  he is divorced. He doesn't walk but not routinely exercise.  He continues to smoke one pack of cigarettes per day.  ROS General: Negative; No fevers, chills, or night sweats;  HEENT: Negative; No changes in vision or hearing, sinus congestion, difficulty swallowing Pulmonary:  Positive for shortness of breath; No cough, wheezing, , hemoptysis Cardiovascular: See history of present  illness GI: Negative; No nausea, vomiting, diarrhea, or abdominal pain GU: Negative; No dysuria, hematuria, or difficulty voiding Musculoskeletal: Negative; no myalgias, joint pain, or weakness Hematologic/Oncology: Negative; no easy bruising, bleeding Endocrine: Positive for diabetes no heat/cold intolerance;  Neuro: Negative; no changes in balance, headaches Skin: Negative; No rashes or skin lesions Psychiatric: Negative; No behavioral problems, depression Sleep: As it of her obstructive sleep apnea, CPAP initiated March 17, 2018. no bruxism, restless legs, hypnogognic hallucinations, no cataplexy Other comprehensive 14 point system review is negative.   PE BP 140/82   Pulse 84   Ht '5\' 9"'$  (1.753 m)   Wt 247 lb (112 kg)   BMI 36.48 kg/m    Repeat blood pressure by me was 142/84.  Pressures at home have ranged from 150-190/90  Wt Readings from Last 3 Encounters:  06/16/18 247 lb (112 kg)  05/28/18 245 lb (111.1 kg)  03/19/18 244 lb 3.2 oz (110.8 kg)   General: Alert, oriented, no distress.  Skin: normal turgor, no rashes, warm and dry HEENT: Normocephalic, atraumatic. Pupils equal round and reactive to light; sclera anicteric; extraocular muscles intact;  Nose without nasal septal hypertrophy Mouth/Parynx benign; Mallinpatti scale 3/4 Neck: No JVD, no carotid bruits; normal carotid upstroke Lungs: clear to ausculatation and percussion; no wheezing or rales Chest wall: without tenderness to palpitation Heart: PMI not displaced, RRR, s1 s2 normal, 1/6 systolic murmur, no diastolic murmur, no rubs, gallops, thrills, or heaves Abdomen: soft, nontender; no hepatosplenomehaly, BS+; abdominal aorta nontender and not dilated by palpation. Back: no CVA tenderness Pulses 2+ Musculoskeletal: full range of motion, normal strength, no joint deformities Extremities: no clubbing cyanosis or edema, Homan's sign negative  Neurologic: grossly nonfocal; Cranial nerves grossly  wnl Psychologic: Normal mood and affect   ECG (independently read by me): Normal sinus rhythm at 84 bpm.  Right bundle branch block with repolarization changes.  Inferior Q waves consistent with prior infarct.  Anteroseptal Q waves consistent with prior anterior MI.  May 28, 2018 ECG (independently read by me): Normal sinus rhythm with sinus arrhythmia.  Right bundle branch block with repolarization changes.  Q waves consistent with anteroseptal MI and possible inferior MI no indication to adjust the pain upon his needs  July 17,2019 ECG (independently read by me): Normal sinus rhythm 89 bpm.  Right bundle branch block with repolarization changes.  Inferior Q waves and anterior  Q waves consistent with anterolateral infarct with possible inferior infarct  February 21, 2018 ECG (independently read by me): Normal sinus rhythm at 95 bpm, right bundle branch block with repolarization changes.  Anterolateral MI.  QTc interval 477 ms  Jan 24, 2018 ECG (independently read by me): Sinus rhythm at 97 bpm.  Right bundle branch block with repolarization changes.  QTc interval 472 ms.  Anterolateral Q waves  Jan 07, 2018 ECG (independently read by me): Normal sinus rhythm at 88 bpm.  Right bundle branch block with repolarization changes.  Inferior Q waves.  T wave anteroseptally, possible anteroseptal MI undetermined.  November 2017 ECG (independently read by me): Normal sinus rhythm at 64 bpm.  Branch block with repolarization  changes.  QTc interval 460 ms.  June 2016 ECG (independently read by me): Sinus bradycardia at 57 bpm, right bundle branch block with repositioning changes.  QTc interval 459 ms.  September 2015 ECG (independently read by me and (: Normal sinus rhythm.  Right bundle branch block with repolarization changes.  Ventricular rate 81.  02/25/2014 ECG Normal sinus rhythm.  Right bundle branch block with repolarization changes.  PR interval 148 ms; QTc interval 463 ms  PriorECG: sinus  rhythm at 76 beats per minute; right bundle-branch block with repolarization changes. PR interval 142 ms, QTC 481 ms.   LABS:  BMP Latest Ref Rng & Units 03/03/2018 02/21/2018 01/13/2018  Glucose 65 - 99 mg/dL 131(H) 89 124(H)  BUN 8 - 27 mg/dL 23 49(H) 28(H)  Creatinine 0.76 - 1.27 mg/dL 1.36(H) 2.70(H) 1.84(H)  BUN/Creat Ratio 10 - '24 17 18 15  '$ Sodium 134 - 144 mmol/L 137 135 138  Potassium 3.5 - 5.2 mmol/L 4.4 5.1 4.8  Chloride 96 - 106 mmol/L 112(H) 103 104  CO2 20 - 29 mmol/L 22 19(L) 18(L)  Calcium 8.6 - 10.2 mg/dL 10.0 9.7 10.2   Hepatic Function Latest Ref Rng & Units 01/13/2018 08/20/2017 08/10/2017  Total Protein 6.0 - 8.5 g/dL 6.6 6.9 6.1(L)  Albumin 3.5 - 4.8 g/dL 4.2 3.7 3.4(L)  AST 0 - 40 IU/L 22 26 206(H)  ALT 0 - 44 IU/L 30 44 53  Alk Phosphatase 39 - 117 IU/L 129(H) 106 80  Total Bilirubin 0.0 - 1.2 mg/dL <0.2 0.4 0.5   CBC Latest Ref Rng & Units 02/19/2018 01/13/2018 08/20/2017  WBC 3.4 - 10.8 x10E3/uL 8.5 8.4 8.8  Hemoglobin 13.0 - 17.7 g/dL 12.5(L) 13.5 14.5  Hematocrit 37.5 - 51.0 % 37.2(L) 39.9 43.6  Platelets 150 - 450 x10E3/uL 186 218 251   Lab Results  Component Value Date   MCV 88 02/19/2018   MCV 88 01/13/2018   MCV 89.7 08/20/2017   Lab Results  Component Value Date   TSH 0.598 02/21/2018   Lipid Panel     Component Value Date/Time   CHOL 148 01/13/2018 0839   TRIG 265 (H) 01/13/2018 0839   HDL 36 (L) 01/13/2018 0839   CHOLHDL 4.1 01/13/2018 0839   CHOLHDL 4.8 08/09/2017 1117   VLDL 52 (H) 08/09/2017 1117   LDLCALC 59 01/13/2018 0839   INR result since initiation of Coumadin: 1:> 7.6;> >10;> 8.6 (Vit K administered) >4.3; no longer on Coumadin, now on Eliquis  RADIOLOGY: No results found.  IMPRESSION: 1. CAD in native artery   2. OSA (obstructive sleep apnea)   3. Essential hypertension   4. Ischemic cardiomyopathy   5. Apical mural thrombus   6. Long term (current) use of anticoagulants   7. Hyperlipidemia with target LDL less than  70     ASSESSMENT AND PLAN:  Bobby [name] is a 72 year old Caucasian male who suffered an acute coronary syndrome on 08/04/2012 when he presented with subtotal occlusion of his RCA. He had diffuse disease beyond the subtotal occlusion and ultimately had successful insertion of a 3.25x32 mm Promus DES stent. He had concomitant CAD with 40-50% proximal LAD narrowing, 30% circumflex marginal stenoses. NMR lipoprofile after initiation of atorvastatin in January 2014 showed marked improvement in total cholesterol of 119 LDL 48 LDL particle #992. HDL was very low at 30 with an increased triglyceride at 203 and significantly reduced HDL particle #24.2. His insulin resistance score was elevated at 65.  [name]  He  suffered an anterior STEMI in December 2018 and underwent successful stenting and several days later he required staged intervention to circumflex and marginal vessel.  His initial echo Doppler study in December raised concerns for potential pre-thrombotic state.  His last echo Doppler study in May 2019 was highly suggestive of apical thrombus.  Warfarin was initially prescribed but due to significant issues with compliance and overmedication the decision was made that he would be safer using Eliquis even though he is on this for apical thrombus rather than atrial fibrillation.  Presently he is without anginal symptoms.  He is maintaining sinus rhythm has not had any recurrent atrial fibrillation.  His blood pressure continues to be elevated.  I am recommending further titration of losartan to 50 mg twice a day and he will continue with furosemide 20 mg daily, carvedilol 6.25 mg twice a day.  There is no bleeding on Eliquis.  I again had a long discussion regarding his noncompliance CPAP use.  I discussed alternatives such as a customized oral appliance and they will look into that particularly if he loses his CPAP machine due to noncompliance.  BMI is 36.48.  Weight loss was discussed.  He continues to be on metformin  for diabetes mellitus.  With LV dysfunction he may be a candidate for Jardiance if his renal function is stable.  I will see him in 4 months for reevaluation   Time Spent: 25 minutes  Troy Sine, MD, [facility]  06/18/2018 6:23 PM

## 2018-06-16 NOTE — Patient Instructions (Signed)
Medication Instructions:  INCREASE Losartan to 50 mg two times daily   If you need a refill on your cardiac medications before your next appointment, please call your pharmacy.   Follow-Up: At [facility], you and your health needs are our priority.  As part of our continuing mission to provide you with exceptional heart care, we have created designated Provider Care Teams.  These Care Teams include your primary Cardiologist (physician) and Advanced Practice Providers (APPs -  Physician Assistants and Nurse Practitioners) who all work together to provide you with the care you need, when you need it. You will need a follow up appointment in 4 months.  Please call our office 2 months in advance to schedule this appointment.  You may see Shelva Majestic, MD or one of the following Advanced Practice Providers on your designated Care Team: [place], [place] . Fabian Sharp, PA-C

## 2018-06-17 ENCOUNTER — Telehealth: Payer: Self-pay | Admitting: Cardiovascular Disease

## 2018-06-17 NOTE — Telephone Encounter (Signed)
New message   Pt c/o medication issue:  1. Name of Medication:losartan (COZAAR) 50 MG tablet  2. How are you currently taking this medication (dosage and times per day)? Twice daily  3. Are you having a reaction (difficulty breathing--STAT)? No   4. What is your medication issue? Per [facility] Nasuti states that they never received a prescription for 50 mg. Please advise. Please fax the correct information to [phone]

## 2018-06-17 NOTE — Telephone Encounter (Signed)
I have called multiple times to reach [place] at [facility] without answer, to clarify which pharmacy patients med needs to go to

## 2018-06-18 ENCOUNTER — Encounter: Payer: Self-pay | Admitting: Cardiovascular Disease

## 2018-06-18 MED ORDER — LOSARTAN POTASSIUM 50 MG PO TABS: ORAL_TABLET | ORAL | 3 refills | Status: DC

## 2018-06-18 NOTE — Telephone Encounter (Signed)
Reached the PharmD at [facility], received pharmacy information. Sent RX to them.

## 2018-06-20 NOTE — Telephone Encounter (Signed)
Follow Up:     Bobby [name] wanted you to know the new dose of blood pressure is not helping, blood pressure is still up.

## 2018-06-20 NOTE — Telephone Encounter (Signed)
He does need to give it a week or so.  Also, chart indicates he is a smoker.  If he checks his BP within 30-60 minutes of a cigarette, it will be higher.  He should continue on losartan 50 mg twice daily and let us know next week how it is looking.

## 2018-06-20 NOTE — Telephone Encounter (Signed)
Called patient, LVM advising of note from PharmD, Left call back number if any further questions.

## 2018-06-20 NOTE — Telephone Encounter (Signed)
Please advise, patient BP med was changed losartan 50 mg BID, he just started this on Tuesday, and his BP is still continuing to stay up. Yesterday morning it was 185/98, yesterday evening it was 166/97, and this morning it was 177/98. I did advised patient that it does take time for medication to take effect on BP, it is not a drastic change, but patient would like to verify if anything else needs to be done. I did advise to continue to take the medication as prescribed and to call us back next week with updated BP, but they wanted to verify with pharmD.   Thanks!

## 2018-06-26 ENCOUNTER — Telehealth: Payer: Self-pay | Admitting: Cardiovascular Disease

## 2018-06-26 NOTE — Telephone Encounter (Signed)
Blood pressure medication was just increased last week.  Allow further time to accommodate and will follow up with pharmacy for additional blood pressure titration

## 2018-06-26 NOTE — Telephone Encounter (Signed)
  Pt c/o BP issue: STAT if pt c/o blurred vision, one-sided weakness or slurred speech  1. What are your last 5 BP readings? 194/97, 177/98, 161/98, 167/96  2. Are you having any other symptoms (ex. Dizziness, headache, blurred vision, passed out)? no  3. What is your BP issue? BP is running high since losartan was changed to 50 mg, bottom number has continued to be in high 90's

## 2018-06-26 NOTE — Telephone Encounter (Signed)
Spoke to  [place]. She wanted to give result of blood pressure. She states patient blood pressure is still elevated since the increase of 50 mg losartan twice a day.  patient has been steadily  Having a weight gain 06/17/18 - 246 lbs  Today 06/26/18 251   readings 06/17/18 181/71  Am           142/78 PM                 06/19/18 185/98 AM    166/97 PM      06/24/18  190/96 am  177/92  Pm       06/25/18   194/97 AM 167/96  PM                  06/26/18     191/97 AM RN, INFORM VICKY , THAT IT MAY TAKE A LITTLE LONGER FOR BLOOD PRESSURE TO TREND DOWNWARD VICKY AWARE WILL DEFER Dr Claiborne Billings AND CONTACT HER WITH ANY CHANGES AS NEEDED

## 2018-06-27 NOTE — Telephone Encounter (Signed)
Bobby [name] (sister) NOTIFIED message sent to scheduling to call

## 2018-07-09 ENCOUNTER — Encounter (HOSPITAL_COMMUNITY): Payer: Self-pay | Admitting: Family Medicine

## 2018-07-11 ENCOUNTER — Encounter (HOSPITAL_COMMUNITY): Payer: Self-pay | Admitting: Family Medicine

## 2018-07-17 ENCOUNTER — Ambulatory Visit (INDEPENDENT_AMBULATORY_CARE_PROVIDER_SITE_OTHER): Payer: Medicare Other | Admitting: Pharmacist

## 2018-07-17 VITALS — BP 146/84 | HR 68 | Ht 69.0 in

## 2018-07-17 DIAGNOSIS — I10 Essential (primary) hypertension: Secondary | ICD-10-CM | POA: Diagnosis not present

## 2018-07-17 MED ORDER — CARVEDILOL 12.5 MG PO TABS: ORAL_TABLET | ORAL | 1 refills | Status: DC

## 2018-07-17 MED ORDER — VALSARTAN 40 MG PO TABS: 40.0000 mg | ORAL_TABLET | Freq: Two times a day (BID) | ORAL | 3 refills | Status: DC

## 2018-07-17 NOTE — Progress Notes (Signed)
Patient ID: Bobby [name]                 DOB: 1946-08-13                      MRN: [id number]     HPI: Bobby [name] is a 72 y.o. male referred by Dr. Claiborne Billings to HTN clinic. PMH includes hypertension, PVC's, RBBB, STEMI, heart failure with EF 35% on 01/2018, COPD, OSA not on CPAP, DM-II, hx of CVA, and hyperlipidemia. Patient presents to clinic for HTN follow up. Patient denies HA, chest pain, blurry vision or increased dizziness. Currently using [facility] home [facility] for medication pre-packing. He still smoking 1 pack of cigarettes per week.  Current HTN meds:  carvedilol 12.5mg  twice daily Furosemide 20mg  daily Losartan 50mg  BID  BP goal: 130/80  Social History: denies alcohol use, 1 pack of cigarettes per week.  Exercise: activities of daily living  Home BP readings:  20 readings; average 160/85; HR 75-87bpm  Wt Readings from Last 3 Encounters:  06/16/18 247 lb (112 kg)  05/28/18 245 lb (111.1 kg)  03/19/18 244 lb 3.2 oz (110.8 kg)   BP Readings from Last 3 Encounters:  07/17/18 (!) 146/84  06/16/18 140/82  05/28/18 122/71   Pulse Readings from Last 3 Encounters:  07/17/18 68  06/16/18 84  05/28/18 76    Past Medical History:  Diagnosis Date  . CAD (coronary artery disease),residual non obstructive disease    a. 08/2012 Inferior STEMI: RCA 99% (3.5x32 Promus DES), nonobs LAD/LCX dzs;  b. 01/2013 MV: EF 66%, small inferoseptal artifact and possible small inferobasilar scar;  c. 03/2014 MV: fixed inf defect->diaph attenuation, no ischemia. 12/18 PCI/DES to mLAD, staged PCI/DES Lcx/OM  . DM (diabetes mellitus), type 2 new diagnosis 08/05/2012   diet control  . ED (erectile dysfunction)    surgery planned  . History of echocardiogram    a. 08/2012 Echo: EF 55-60%, possible mild basal inferior HK, mildly dil RA, PASP 6mmHg.  [name] Hyperlipidemia 08/05/2012  . Hypertensive heart disease   . MI (myocardial infarction) ([place]) 08/04/2012   acute inferior stemi secondary to RCA occlusion;  PCI  . Panic attack   . Pulmonary HTN ([place])    echo 08/05/12, EF 55-60%, PA pressure 90mm  . S/P coronary artery stent placement, to RCA Promus DES 08/05/2012  . Stroke [facility])    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  . Tobacco abuse     Current Outpatient Medications on File Prior to Visit  Medication Sig Dispense Refill  . albuterol (PROVENTIL HFA;VENTOLIN HFA) 108 (90 Base) MCG/ACT inhaler Inhale 2 puffs into the lungs every 6 (six) hours as needed for wheezing or shortness of breath. 1 Inhaler 2  . albuterol (PROVENTIL) (2.5 MG/3ML) 0.083% nebulizer solution Take 5 mg by nebulization every 4 (four) hours as needed. For wheezing or SOB    . apixaban (ELIQUIS) 5 MG TABS tablet Take 1 tablet (5 mg total) by mouth 2 (two) times daily. 180 tablet 3  . atorvastatin (LIPITOR) 80 MG tablet Take 1 tablet (80 mg total) by mouth daily at 6 PM. 90 tablet 3  . buPROPion (WELLBUTRIN XL) 300 MG 24 hr tablet Take 300 mg by mouth daily.    . clonazePAM (KLONOPIN) 0.5 MG tablet Take 1 mg by mouth 2 (two) times daily.   0  . clopidogrel (PLAVIX) 75 MG tablet Take 1 tablet (75 mg total) by mouth daily. 90 tablet 3  . COLCRYS 0.6  MG tablet Take 0.6 mg by mouth daily.  3  . furosemide (LASIX) 20 MG tablet Take 1 tablet (20 mg total) by mouth daily. 90 tablet 3  . metFORMIN (GLUCOPHAGE-XR) 500 MG 24 hr tablet Take 500 mg by mouth daily.  3  . nitroGLYCERIN (NITROSTAT) 0.4 MG SL tablet Place 1 tablet (0.4 mg total) under the tongue every 5 (five) minutes x 3 doses as needed for chest pain. 25 tablet 2  . Omega-3 Fatty Acids (FISH OIL PO) Take 1 tablet by mouth daily.    [name] oxyCODONE-acetaminophen (PERCOCET) 10-325 MG per tablet Take 1 tablet by mouth 3 (three) times daily.     . potassium chloride (K-DUR,KLOR-CON) 10 MEQ tablet Take 1 tablet (10 mEq total) by mouth daily. 90 tablet 3  . sertraline (ZOLOFT) 100 MG tablet Take 100 mg by mouth daily.    . Tiotropium Bromide-Olodaterol (STIOLTO RESPIMAT) 2.5-2.5 MCG/ACT AERS  Inhale 2 puffs into the lungs daily.     No current facility-administered medications on file prior to visit.     Allergies  Allergen Reactions  . Hydrocodone Itching    Blood pressure (!) 146/84, pulse 68, height 5\' 9"  (1.753 m).  HTN (hypertension) Blood pressure remains above goal and patient tolerating current therapy without problems. Will discontinue losartan 50mg  BID, start valsartan 40mg  BID and follow up in 4 weeks. Expected to need 160mg  of valsartan daily but will titrate dose slowly due to extreme response to medication in the past.   Noma Quijas Rodriguez-Guzman PharmD, BCPS, [place] [address] [place] [address] 07/20/2018 7:11 PM

## 2018-07-17 NOTE — Patient Instructions (Signed)
Return for a  follow up appointment in 4 weeks  Check your blood pressure at home daily (if able) and keep record of the readings.  Take your BP meds as follows: *STOP taking losartan 50mg  * START taking valsartan 40mg  twice daily*  Bring all of your meds, your BP cuff and your record of home blood pressures to your next appointment.  Exercise as you're able, try to walk approximately 30 minutes per day.  Keep salt intake to a minimum, especially watch canned and prepared boxed foods.  Eat more fresh fruits and vegetables and fewer canned items.  Avoid eating in fast food restaurants.    HOW TO TAKE YOUR BLOOD PRESSURE: . Rest 5 minutes before taking your blood pressure. .  Don't smoke or drink caffeinated beverages for at least 30 minutes before. . Take your blood pressure before (not after) you eat. . Sit comfortably with your back supported and both feet on the floor (don't cross your legs). . Elevate your arm to heart level on a table or a desk. . Use the proper sized cuff. It should fit smoothly and snugly around your bare upper arm. There should be enough room to slip a fingertip under the cuff. The bottom edge of the cuff should be 1 inch above the crease of the elbow. . Ideally, take 3 measurements at one sitting and record the average.

## 2018-07-20 ENCOUNTER — Encounter: Payer: Self-pay | Admitting: Pharmacist

## 2018-07-20 NOTE — Assessment & Plan Note (Signed)
Blood pressure remains above goal and patient tolerating current therapy without problems. Will discontinue losartan 50mg  BID, start valsartan 40mg  BID and follow up in 4 weeks. Expected to need 160mg  of valsartan daily but will titrate dose slowly due to extreme response to medication in the past.

## 2018-08-12 ENCOUNTER — Telehealth: Payer: Self-pay

## 2018-08-12 IMAGING — DX DG CHEST 1V PORT
1 series · 1 of 1 positions shown · non-contrast
Comparison: PA and lateral chest 06/28/2017 and 05/28/2014.

CLINICAL DATA: Chest pain today.

EXAM:
PORTABLE CHEST 1 VIEW

[chest ap]
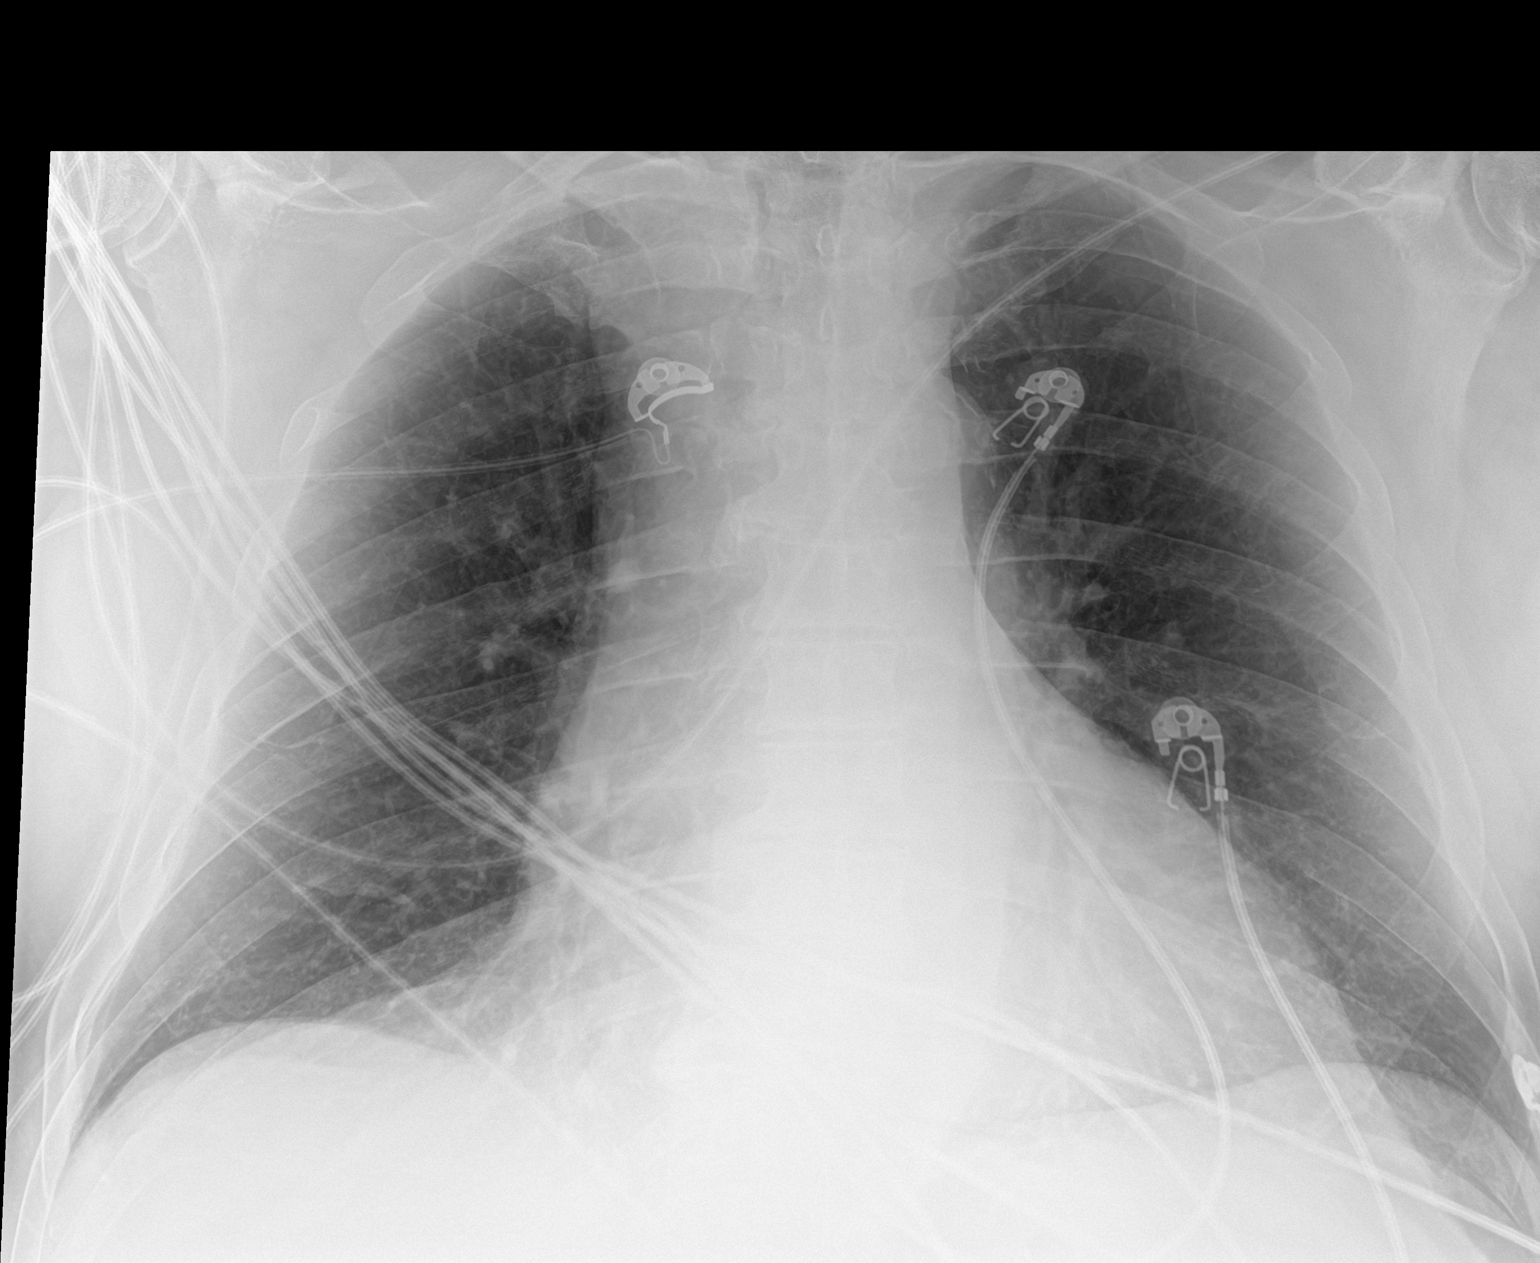

[1 of 1 positions shown; findings below may reference images not displayed]

FINDINGS: The lungs are clear. Heart size is normal. No pneumothorax or
pleural effusion. No bony abnormality.
IMPRESSION: Negative chest.

## 2018-08-12 NOTE — Telephone Encounter (Signed)
Pt sister Bobby [name] stated that she would call pcp to find out if they have ran a lipid panel and if not she would call me back so that I can get him labs prior to the appt on Thursday with the lipid clinic

## 2018-08-14 ENCOUNTER — Ambulatory Visit (INDEPENDENT_AMBULATORY_CARE_PROVIDER_SITE_OTHER): Payer: Medicare Other | Admitting: Pharmacist Clinician (PhC)/ Clinical Pharmacy Specialist

## 2018-08-14 DIAGNOSIS — I10 Essential (primary) hypertension: Secondary | ICD-10-CM | POA: Diagnosis not present

## 2018-08-14 NOTE — Patient Instructions (Signed)
Return for a a follow up appointment with Dr. Claiborne Billings in February  Your blood pressure today is 144/86  Check your blood pressure at home daily and keep record of the readings.  Take your BP meds as follows:  Continue with your daily medicine packets  Continue with the carvedilol tablets in the bottle twice daily until January 31.    Bring all of your meds, your BP cuff and your record of home blood pressures to your next appointment.  Exercise as you're able, try to walk approximately 30 minutes per day.  Keep salt intake to a minimum, especially watch canned and prepared boxed foods.  Eat more fresh fruits and vegetables and fewer canned items.  Avoid eating in fast food restaurants.    HOW TO TAKE YOUR BLOOD PRESSURE: . Sit at the table for 5 minutes, first thing each morning.   . After 5 minutes rest your wrist across you chest and push the start button . Write the reading down on a piece of paper

## 2018-08-14 NOTE — Progress Notes (Signed)
Patient ID: Bobby [name]                 DOB: 1946/04/18                      MRN: [id number]     HPI: Bobby [name] is a 72 y.o. male referred by Dr. Claiborne Billings to HTN clinic. PMH includes hypertension, PVC's, RBBB, STEMI, heart failure with EF 35% on 01/2018, COPD, OSA not on CPAP, DM-II, hx of CVA, and hyperlipidemia. Patient presents to clinic for HTN follow up. Patient denies HA, chest pain, blurry vision or increased dizziness.   Patient has a difficult time with remembering to take medications, as well as the purpose of his meds.  His sister oversees all of this and is currently using [facility] for medication pre-packing. She also has a home aide come to his home twice daily to be sure he takes his medications as well as helps with daily living activities.  He still smoking 1 pack of cigarettes per week.  Today he has no complaints other than occasional DOE or weakness, but nothing out of the ordinary.     His medications come in pre-packed little "envelopes" marked clearly with the date and AM or PM.  Each morning he has 2 envelopes and each evening 1.  His sister brought a day supply so we could verify the accuracy of what is being given.  Each morning and evening his envelopes include carvedilol 6.25 mg, but he also has a bottle of 12.5 mg tablets, which the aide is giving as well.   At his last visit with CVRR, it was decided to switch the losartan 50 mg bid to valsartan 40 mg bid, with expected titration to a higher dose.  Unfortunately this change never occurred and he is still on the losartan.  Not sure exactly how/when he came to be taking both the 6.25 and 12.5 mg doses of carvedilol, but he is currently stable.    Current HTN meds:  carvedilol 12.5mg  twice daily - taking 18.375 - has 6.25 bid in packet 12.5 in bottle Furosemide 20mg  daily Losartan 50mg  BID - never switched to valasartan  BP goal: 130/80  Social History: denies alcohol use, 1 pack of cigarettes per  week.  Exercise: activities of daily living  Home BP readings: no home readings   Wt Readings from Last 3 Encounters:  06/16/18 247 lb (112 kg)  05/28/18 245 lb (111.1 kg)  03/19/18 244 lb 3.2 oz (110.8 kg)   BP Readings from Last 3 Encounters:  08/14/18 (!) 144/86  07/17/18 (!) 146/84  06/16/18 140/82   Pulse Readings from Last 3 Encounters:  07/17/18 68  06/16/18 84  05/28/18 76    Past Medical History:  Diagnosis Date  . CAD (coronary artery disease),residual non obstructive disease    a. 08/2012 Inferior STEMI: RCA 99% (3.5x32 Promus DES), nonobs LAD/LCX dzs;  b. 01/2013 MV: EF 66%, small inferoseptal artifact and possible small inferobasilar scar;  c. 03/2014 MV: fixed inf defect->diaph attenuation, no ischemia. 12/18 PCI/DES to mLAD, staged PCI/DES Lcx/OM  . DM (diabetes mellitus), type 2 new diagnosis 08/05/2012   diet control  . ED (erectile dysfunction)    surgery planned  . History of echocardiogram    a. 08/2012 Echo: EF 55-60%, possible mild basal inferior HK, mildly dil RA, PASP 3mmHg.  [name] Hyperlipidemia 08/05/2012  . Hypertensive heart disease   . MI (myocardial infarction) ([place]) 08/04/2012  acute inferior stemi secondary to RCA occlusion; PCI  . Panic attack   . Pulmonary HTN ([facility])    echo 08/05/12, EF 55-60%, PA pressure 4mm  . S/P coronary artery stent placement, to RCA Promus DES 08/05/2012  . Stroke [facility])    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  . Tobacco abuse     Current Outpatient Medications on File Prior to Visit  Medication Sig Dispense Refill  . albuterol (PROVENTIL HFA;VENTOLIN HFA) 108 (90 Base) MCG/ACT inhaler Inhale 2 puffs into the lungs every 6 (six) hours as needed for wheezing or shortness of breath. 1 Inhaler 2  . albuterol (PROVENTIL) (2.5 MG/3ML) 0.083% nebulizer solution Take 5 mg by nebulization every 4 (four) hours as needed. For wheezing or SOB    . apixaban (ELIQUIS) 5 MG TABS tablet Take 1 tablet (5 mg total) by mouth 2 (two) times  daily. 180 tablet 3  . atorvastatin (LIPITOR) 80 MG tablet Take 1 tablet (80 mg total) by mouth daily at 6 PM. 90 tablet 3  . buPROPion (WELLBUTRIN XL) 300 MG 24 hr tablet Take 300 mg by mouth daily.    . carvedilol (COREG) 12.5 MG tablet Take 6.25 mg (1 tablet) by mouth two times daily 180 tablet 1  . clonazePAM (KLONOPIN) 0.5 MG tablet Take 1 mg by mouth 2 (two) times daily.   0  . clopidogrel (PLAVIX) 75 MG tablet Take 1 tablet (75 mg total) by mouth daily. 90 tablet 3  . COLCRYS 0.6 MG tablet Take 0.6 mg by mouth daily.  3  . furosemide (LASIX) 20 MG tablet Take 1 tablet (20 mg total) by mouth daily. 90 tablet 3  . metFORMIN (GLUCOPHAGE-XR) 500 MG 24 hr tablet Take 500 mg by mouth daily.  3  . nitroGLYCERIN (NITROSTAT) 0.4 MG SL tablet Place 1 tablet (0.4 mg total) under the tongue every 5 (five) minutes x 3 doses as needed for chest pain. 25 tablet 2  . Omega-3 Fatty Acids (FISH OIL PO) Take 1 tablet by mouth daily.    [name] oxyCODONE-acetaminophen (PERCOCET) 10-325 MG per tablet Take 1 tablet by mouth 3 (three) times daily.     . potassium chloride (K-DUR,KLOR-CON) 10 MEQ tablet Take 1 tablet (10 mEq total) by mouth daily. 90 tablet 3  . sertraline (ZOLOFT) 100 MG tablet Take 100 mg by mouth daily.    . Tiotropium Bromide-Olodaterol (STIOLTO RESPIMAT) 2.5-2.5 MCG/ACT AERS Inhale 2 puffs into the lungs daily.    . valsartan (DIOVAN) 40 MG tablet Take 1 tablet (40 mg total) by mouth 2 (two) times daily. 60 tablet 3   No current facility-administered medications on file prior to visit.     Allergies  Allergen Reactions  . Hydrocodone Itching    Blood pressure (!) 144/86.  HTN (hypertension) Patient with hypertension still not at goal, but currently stable.  Because of the challenges associated with changing his medications, I am going to leave them as they currently are.  He will continue with the added carvedilol home dose to equal 18.75 mg twice daily thru the end of January (the bottle  he has will easily last that long).  I will contact the [facility] in January to have them change his envelopes for the month of February.  His sister knows to stop the extra carvedilol on January 31, but I will reach out to her at that time and remind her.  Patient is due to see Dr. Claiborne Billings in early February and we can see him after  that if needed.  Tommy Medal PharmD CPP [facility] [address] [place] [address] 08/20/2018 11:31 AM

## 2018-08-20 ENCOUNTER — Encounter: Payer: Self-pay | Admitting: Pharmacist Clinician (PhC)/ Clinical Pharmacy Specialist

## 2018-08-20 NOTE — Assessment & Plan Note (Signed)
Patient with hypertension still not at goal, but currently stable.  Because of the challenges associated with changing his medications, I am going to leave them as they currently are.  He will continue with the added carvedilol home dose to equal 18.75 mg twice daily thru the end of January (the bottle he has will easily last that long).  I will contact the [facility] in January to have them change his envelopes for the month of February.  His sister knows to stop the extra carvedilol on January 31, but I will reach out to her at that time and remind her.  Patient is due to see Dr. Claiborne Billings in early February and we can see him after that if needed.

## 2018-09-18 ENCOUNTER — Emergency Department (HOSPITAL_COMMUNITY)
Admission: EM | Admit: 2018-09-18 | Discharge: 2018-09-18 | Disposition: A | Discharge status: Home / Self Care | Payer: Medicare Other | Attending: Emergency Medicine | Admitting: Emergency Medicine

## 2018-09-18 ENCOUNTER — Emergency Department (HOSPITAL_COMMUNITY): Payer: Medicare Other

## 2018-09-18 ENCOUNTER — Encounter (HOSPITAL_COMMUNITY): Payer: Self-pay | Admitting: Emergency Medicine

## 2018-09-18 DIAGNOSIS — I2510 Atherosclerotic heart disease of native coronary artery without angina pectoris: Secondary | ICD-10-CM | POA: Insufficient documentation

## 2018-09-18 DIAGNOSIS — E785 Hyperlipidemia, unspecified: Secondary | ICD-10-CM | POA: Insufficient documentation

## 2018-09-18 DIAGNOSIS — I5022 Chronic systolic (congestive) heart failure: Secondary | ICD-10-CM | POA: Diagnosis not present

## 2018-09-18 DIAGNOSIS — Z7902 Long term (current) use of antithrombotics/antiplatelets: Secondary | ICD-10-CM | POA: Diagnosis not present

## 2018-09-18 DIAGNOSIS — Z79899 Other long term (current) drug therapy: Secondary | ICD-10-CM | POA: Diagnosis not present

## 2018-09-18 DIAGNOSIS — Z7984 Long term (current) use of oral hypoglycemic drugs: Secondary | ICD-10-CM | POA: Diagnosis not present

## 2018-09-18 DIAGNOSIS — I110 Hypertensive heart disease with heart failure: Secondary | ICD-10-CM | POA: Insufficient documentation

## 2018-09-18 DIAGNOSIS — E119 Type 2 diabetes mellitus without complications: Secondary | ICD-10-CM | POA: Insufficient documentation

## 2018-09-18 DIAGNOSIS — Z8673 Personal history of transient ischemic attack (TIA), and cerebral infarction without residual deficits: Secondary | ICD-10-CM | POA: Diagnosis not present

## 2018-09-18 DIAGNOSIS — Z7901 Long term (current) use of anticoagulants: Secondary | ICD-10-CM | POA: Diagnosis not present

## 2018-09-18 DIAGNOSIS — R072 Precordial pain: Secondary | ICD-10-CM | POA: Insufficient documentation

## 2018-09-18 DIAGNOSIS — F17210 Nicotine dependence, cigarettes, uncomplicated: Secondary | ICD-10-CM | POA: Diagnosis not present

## 2018-09-18 DIAGNOSIS — I252 Old myocardial infarction: Secondary | ICD-10-CM | POA: Diagnosis not present

## 2018-09-18 DIAGNOSIS — Z8679 Personal history of other diseases of the circulatory system: Secondary | ICD-10-CM

## 2018-09-18 DIAGNOSIS — R0789 Other chest pain: Secondary | ICD-10-CM

## 2018-09-18 LAB — BASIC METABOLIC PANEL
Anion gap: 10 (ref 5–15)
BUN: 29 mg/dL — ABNORMAL HIGH (ref 8–23)
CO2: 23 mmol/L (ref 22–32)
Calcium: 9.7 mg/dL (ref 8.9–10.3)
Chloride: 107 mmol/L (ref 98–111)
Creatinine, Ser: 2.1 mg/dL — ABNORMAL HIGH (ref 0.61–1.24)
GFR calc Af Amer: 35 mL/min — ABNORMAL LOW (ref >60)
GFR calc non Af Amer: 31 mL/min — ABNORMAL LOW (ref >60)
GLUCOSE: 149 mg/dL — AB (ref 70–99)
Potassium: 4.5 mmol/L (ref 3.5–5.1)
Sodium: 140 mmol/L (ref 135–145)

## 2018-09-18 LAB — I-STAT TROPONIN, ED
Troponin i, poc: 0 ng/mL (ref 0.00–0.08)
Troponin i, poc: 0.01 ng/mL (ref 0.00–0.08)

## 2018-09-18 LAB — CBC
HCT: 42.1 % (ref 39.0–52.0)
Hemoglobin: 13.1 g/dL (ref 13.0–17.0)
MCH: 27.9 pg (ref 26.0–34.0)
MCHC: 31.1 g/dL (ref 30.0–36.0)
MCV: 89.8 fL (ref 80.0–100.0)
Platelets: 179 10*3/uL (ref 150–400)
RBC: 4.69 MIL/uL (ref 4.22–5.81)
RDW: 13.5 % (ref 11.5–15.5)
WBC: 8.7 10*3/uL (ref 4.0–10.5)
nRBC: 0 % (ref 0.0–0.2)

## 2018-09-18 NOTE — ED Provider Notes (Signed)
Signed out by Dr Stark Jock that d/c instructions are done, and that cardiology has already cleared for d/c, and that when delta trop returns and if not elevated, to d/c to home.   Repeat trop normal. Pt comfortable appearing, nad.      Lajean Saver, MD 09/18/18 [phone]
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
## 2018-09-18 NOTE — ED Triage Notes (Signed)
Pt arrives by ems after having a sudden onset of left sided chest pain with no radiation- denies any n/d or diaphoresis. Pt was at a store when chest began to hurt then she drove himself home took his nitro which relieved his pain. Pt also received 324 mg asa by ems.

## 2018-09-18 NOTE — Discharge Instructions (Addendum)
Follow-up with Dr. Claiborne Billings in the next week, and return to the emergency department if your symptoms significantly worsen or change.  Continue medications as previously prescribed.

## 2018-09-18 NOTE — Consult Note (Addendum)
Cardiology Consult Note   Patient ID: Bobby [name] MRN: [id number]; DOB: [birth year]   Admission date: 09/18/2018  Primary Care Provider: Hayden Rasmussen, MD Primary Cardiologist: Shelva Majestic, MD  Primary Electrophysiologist:  None   Chief Complaint:  Chest Pain  Patient Profile:    Bobby [name] is a 73 y.o. male with a history CAD with inferior STEMI with DES to RCA in 2013 and anteroapical STEMI s/p DES to the LAD and then staged PCI with DES to LCx and first OM in 08/2017, ischemic cardiomyopathy with EF of 35-40% and on chronic anticoagulation for left ventricular thrombus, hypertension, hyperlipidemia, type 2 diabetes mellitus, previous CVA, obstructive sleep apnea not compliant with CPAP, COPD, pulmonary hypertension CKD stage IV, and continued tobacco use, who presents for evaluation of chest pain.  History of Present Illness:   Mr. Nabers is a 73 year old male with the above history who is followed by Dr. Claiborne Billings.  In 08/2017, patient had an anterior STEMI due to acute occlusion of his mid LAD. Troponin peaked at >65 at that time. He underwent successful PCI with DES to his LAD and staged PCI to left circumflex lesion in OM1 lesion.  Echocardiogram at that time showed LVEF of 30 to 35% (down from 55-60% in 06/2017) with grade 1 diastolic dysfunction.  There was sludge noted in the apex (a sign of pre-thrombotic state) but no definite thrombus was seen.  Repeat Echocardiogram in 01/2018 showed EF of 35 to 40% with akinesis of the distal septum and apex and overall moderate LV dysfunction. A possible small layered thrombus was also noted at the LV apex with evidence of swirling suggestive of low flow state. Therefore, patient was started on anticoagulation with Coumadin. Patient was not taking Coumadin correctly resulting in an INR >10, so he was switched to Eliquis. Patient last saw Dr. Claiborne Billings on 06/16/2018 at which time he reported his BP had been consistently elevated recently but was  otherwise doing well from a cardiac standpoint.   Patient [facility] ED via EMS today for evaluation of chest pain.  Patient reports he was at the store gambling with scratch off lottery tickets when he suddenly developed very brief intermittent episodes of sharp substernal/left sided chest pain. Patient describes the pain as a stabbing sensation and states each episode lasted about 1 second and then resolved. Patient said he had about 4 of these very brief episodes over about a 5 minute span. No diaphoresis, shortness of breath, nausea, vomiting, palpitations, lightheadedness, dizziness. He drove back to his house which was close by and took a sublingual nitroglycerin and then called 911 Patient states this pain is very different than the pain he had prior to his MIs. Patient denies any additional pain since he took the sublingual nitroglycerin. Patient is not very active but denies any recent exertional chest pain. He does have some shortness of breath with exertion but this is not new. He denies any orthopnea, PND, or edema. No recent fevers or illnesses. Patient continues to smoke about 1/2 pack per day.   In the ED, EKG showed no acute ischemic changes compared to prior tracings. I-stat troponin negative. CBC unremarkable. Na 140, K 4.5, Glucose 149, SCr 2.10. Cardiology was consulted for further evaluation. Patient is currently chest pain free.   Past Medical History:  Diagnosis Date  . CAD (coronary artery disease),residual non obstructive disease    a. 08/2012 Inferior STEMI: RCA 99% (3.5x32 Promus DES), nonobs LAD/LCX dzs;  b. 01/2013 MV: EF 66%, small inferoseptal artifact and possible small inferobasilar scar;  c. 03/2014 MV: fixed inf defect->diaph attenuation, no ischemia. 12/18 PCI/DES to mLAD, staged PCI/DES Lcx/OM  . DM (diabetes mellitus), type 2 new diagnosis 08/05/2012   diet control  . ED (erectile dysfunction)    surgery planned  . History of echocardiogram    a.  08/2012 Echo: EF 55-60%, possible mild basal inferior HK, mildly dil RA, PASP 24mmHg.  [name] Hyperlipidemia 08/05/2012  . Hypertensive heart disease   . MI (myocardial infarction) ([place]) 08/04/2012   acute inferior stemi secondary to RCA occlusion; PCI  . Panic attack   . Pulmonary HTN ([place])    echo 08/05/12, EF 55-60%, PA pressure 83mm  . S/P coronary artery stent placement, to RCA Promus DES 08/05/2012  . Stroke [facility])    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  . Tobacco abuse     Past Surgical History:  Procedure Laterality Date  . CARDIAC CATHETERIZATION  08/04/12   PCI to RCA with DES  . CORONARY STENT INTERVENTION N/A 08/12/2017   Procedure: CORONARY STENT INTERVENTION;  Surgeon: Martinique, Peter M, MD;  Location: [place] [facility];  Service: Cardiovascular;  Laterality: N/A;  . CORONARY/GRAFT ACUTE MI REVASCULARIZATION N/A 08/09/2017   Procedure: Coronary/Graft Acute MI Revascularization;  Surgeon: Belva Crome, MD;  Location: [place] [facility];  Service: Cardiovascular;  Laterality: N/A;  . LEFT HEART CATH AND CORONARY ANGIOGRAPHY N/A 08/09/2017   Procedure: LEFT HEART CATH AND CORONARY ANGIOGRAPHY;  Surgeon: Belva Crome, MD;  Location: [place] [facility];  Service: Cardiovascular;  Laterality: N/A;  . LEFT HEART CATHETERIZATION WITH CORONARY ANGIOGRAM N/A 08/04/2012   Procedure: LEFT HEART CATHETERIZATION WITH CORONARY ANGIOGRAM;  Surgeon: Troy Sine, MD;  Location: [facility] CATH LAB;  Service: Cardiovascular;  Laterality: N/A;  . LEG SURGERY Left    rod placed for fracture repair  . PENILE PROSTHESIS IMPLANT N/A 06/03/2014   Procedure: IMPLANT PENILE PROTHESIS INFLATABLE;  Surgeon: Ailene Rud, MD;  Location: [facility];  Service: Urology;  Laterality: N/A;  with penile block--0.5% marcaine plain  . PERCUTANEOUS CORONARY STENT INTERVENTION (PCI-S) Right 08/04/2012   Procedure: PERCUTANEOUS CORONARY STENT INTERVENTION (PCI-S);  Surgeon: Troy Sine, MD;  Location: [facility] CATH LAB;  Service:  Cardiovascular;  Laterality: Right;  . THUMB ARTHROSCOPY Left    thumb joint replaced     Medications Prior to Admission: Prior to Admission medications   Medication Sig Start Date End Date Taking? Authorizing Provider  albuterol (PROVENTIL HFA;VENTOLIN HFA) 108 (90 Base) MCG/ACT inhaler Inhale 2 puffs into the lungs every 6 (six) hours as needed for wheezing or shortness of breath. 08/21/17   Bhagat, Crista Luria, PA  albuterol (PROVENTIL) (2.5 MG/3ML) 0.083% nebulizer solution Take 5 mg by nebulization every 4 (four) hours as needed. For wheezing or SOB    [provider]  apixaban (ELIQUIS) 5 MG TABS tablet Take 1 tablet (5 mg total) by mouth 2 (two) times daily. 03/11/18   Troy Sine, MD  atorvastatin (LIPITOR) 80 MG tablet Take 1 tablet (80 mg total) by mouth daily at 6 PM. 03/11/18   Troy Sine, MD  buPROPion (WELLBUTRIN XL) 300 MG 24 hr tablet Take 300 mg by mouth daily.    [provider]  carvedilol (COREG) 12.5 MG tablet Take 6.25 mg (1 tablet) by mouth two times daily 07/17/18   Troy Sine, MD  clonazePAM (KLONOPIN) 0.5 MG tablet Take 1 mg by mouth 2 (  two) times daily.  06/01/17   [provider]  clopidogrel (PLAVIX) 75 MG tablet Take 1 tablet (75 mg total) by mouth daily. 03/11/18   Troy Sine, MD  COLCRYS 0.6 MG tablet Take 0.6 mg by mouth daily. 03/21/17   [provider]  furosemide (LASIX) 20 MG tablet Take 1 tablet (20 mg total) by mouth daily. 03/11/18   Troy Sine, MD  metFORMIN (GLUCOPHAGE-XR) 500 MG 24 hr tablet Take 500 mg by mouth daily. 03/21/17   [provider]  nitroGLYCERIN (NITROSTAT) 0.4 MG SL tablet Place 1 tablet (0.4 mg total) under the tongue every 5 (five) minutes x 3 doses as needed for chest pain. 08/14/17   Cheryln Manly, NP  Omega-3 Fatty Acids (FISH OIL PO) Take 1 tablet by mouth daily.    [provider]  oxyCODONE-acetaminophen (PERCOCET) 10-325 MG per tablet Take 1 tablet by mouth  3 (three) times daily.     [provider]  potassium chloride (K-DUR,KLOR-CON) 10 MEQ tablet Take 1 tablet (10 mEq total) by mouth daily. 03/11/18   Troy Sine, MD  sertraline (ZOLOFT) 100 MG tablet Take 100 mg by mouth daily.    [provider]  Tiotropium Bromide-Olodaterol (STIOLTO RESPIMAT) 2.5-2.5 MCG/ACT AERS Inhale 2 puffs into the lungs daily.    [provider]  valsartan (DIOVAN) 40 MG tablet Take 1 tablet (40 mg total) by mouth 2 (two) times daily. 07/17/18   Troy Sine, MD     Allergies:    Allergies  Allergen Reactions  . Hydrocodone Itching    Social History:   Social History   Socioeconomic History  . Marital status: Legally Separated    Spouse name: Not on file  . Number of children: Not on file  . Years of education: Not on file  . Highest education level: Not on file  Occupational History  . Not on file  Social Needs  . Financial resource strain: Not on file  . Food insecurity:    Worry: Not on file    Inability: Not on file  . Transportation needs:    Medical: Not on file    Non-medical: Not on file  Tobacco Use  . Smoking status: Current Every Day Smoker    Packs/day: 1.00    Years: 56.00    Pack years: 56.00    Types: Cigarettes  . Smokeless tobacco: Never Used  . Tobacco comment: smoking now 1 ppd  Substance and Sexual Activity  . Alcohol use: Yes    Comment: ocasionally  . Drug use: No  . Sexual activity: Not on file  Lifestyle  . Physical activity:    Days per week: Not on file    Minutes per session: Not on file  . Stress: Not on file  Relationships  . Social connections:    Talks on phone: Not on file    Gets together: Not on file    Attends religious service: Not on file    Active member of club or organization: Not on file    Attends meetings of clubs or organizations: Not on file    Relationship status: Not on file  . Intimate partner violence:    Fear of current or ex partner: Not on file     Emotionally abused: Not on file    Physically abused: Not on file    Forced sexual activity: Not on file  Other Topics Concern  . Not on file  Social History  Narrative  . Not on file    Family History:   The patient's family history includes Heart attack in his paternal grandmother; Heart disease in his maternal grandmother; Kidney disease in his father; Stomach cancer in his maternal grandfather.    ROS:  Please see the history of present illness.  Review of Systems  Constitutional: Negative for chills, diaphoresis and fever.  HENT: Negative for congestion and sore throat.   Respiratory: Positive for shortness of breath. Negative for cough and hemoptysis.   Cardiovascular: Positive for chest pain. Negative for palpitations, orthopnea, leg swelling and PND.  Gastrointestinal: Negative for blood in stool, nausea and vomiting.  Genitourinary: Negative for hematuria.  Musculoskeletal: Negative for myalgias.  Neurological: Negative for dizziness and loss of consciousness.  Endo/Heme/Allergies: Does not bruise/bleed easily.  Psychiatric/Behavioral: Positive for substance abuse (tobacco use).    Physical Exam/Data:   Vitals:   09/18/18 1237 09/18/18 1245 09/18/18 1246 09/18/18 1300  BP:  (!) 129/57  (!) 131/59  Pulse: 80 77 76 76  Resp: 12 14 15 17   Temp: 98.1 F (36.7 C)     TempSrc: Oral     SpO2: 95% 96% 96% 97%  Weight:      Height:       No intake or output data in the 24 hours ending 09/18/18 1551 Last 3 Weights 09/18/2018 06/16/2018 05/28/2018  Weight (lbs) 250 lb 247 lb 245 lb  Weight (kg) 113.399 kg 112.038 kg 111.131 kg     Body mass index is 36.92 kg/m.  General:  Obese Caucasian male resting comfortably in no acute distress. HEENT: Head normocephalic and atraumatic. Sclera clear. EOMs intact. PERRLA. Lymph: No adenopathy. Neck: Supple. JVD difficult to assess due to body habitus. Endocrine:  No thyromegaly. Vascular: No carotid bruits. Radial and distal pedal  pulses 2+ and equal. Cardiac: RRR. Distinct S1 and S2. No significant murmurs, gallops, or rubs. Lungs: Clear to auscultation bilaterally. No wheezing, rhonchi, or rales.  GI: Abdomen soft, obese, and non-tender to palpation. Bowel sounds present. Ext: No lower extremity edema. Musculoskeletal:  No deformities. BUE and BLE strength normal and equal for age. Skin: Warm and dry. Neuro:  Alert and oriented x3. No focal deficits. Psych:  Normal affect. Responds appropriately.    EKG:  The ECG that was done was personally reviewed and demonstrates normal sinus rhythm with no acute ischemic changes compared to prior tracings.  Telemetry: The telemetry was personally reviewed and demonstrates sinus rhythm with heart rates in the 70's to 80's and rare PVCs.  Relevant CV Studies:  Left Heart Catheterization 08/09/2017:  Acute anteroapical ST elevation myocardial infarction due to acute occlusion of the mid LAD at the second diagonal.  Diffusely diseased LAD with subtotal occlusion occurring in a longer segment of involvement  Left dominant coronary anatomy.  Widely patent left main coronary artery  Dominant circumflex with 99% mid vessel stenosis and 80% stenosis in a large first obtuse marginal branch  Widely patent nondominant right coronary previously stented without evidence of restenosis or obstruction.  Successful culprit PCI of the LAD using a 2 5 x 22 mm Onyx DES postdilated to 3 mm in diameter with TIMI grade III flow.  The second diagonal was jailed but maintained TIMI grade III flow.  There is mild to moderate disease proximal and distal to the stent.  TIMI grade III flow was noted.    Recommendations:  Staged PCI of OM1 and mid circumflex on Monday -needs to be scheduled.  Close  follow-up with kidney function is a patient has stage III chronic kidney disease and received greater than 200 cc of contrast during the procedure.  IV nitroglycerin  Continue Brilinta 90 mg p.o.  twice daily  Aggressive lipid management.  _______________   Staged PCI 08/12/2017:  Previously placed Prox LAD to Mid LAD stent (unknown type) is widely patent.  Mid Cx lesion is 99% stenosed.  A drug-eluting stent was successfully placed using a STENT SIERRA 3.00 X 28 MM.  Post intervention, there is a 0% residual stenosis.  Ost 1st Mrg to 1st Mrg lesion is 85% stenosed.  A drug-eluting stent was successfully placed using a STENT RESOLUTE ONYX 3.0X18.  Post intervention, there is a 0% residual stenosis.   1. Successful stenting of the mid LCx with a DES 2. Successful stenting of the first OM with DES  Plan: DAPT for at least one year. Hydrate. Anticipate DC in am if renal function is stable and no complication. _______________  Echocardiogram 01/20/2018: Study Conclusions: - Left ventricle: The cavity size was normal. Wall thickness was   increased in a pattern of mild LVH. There was mild focal basal   hypertrophy of the septum. Systolic function was moderately   reduced. The estimated ejection fraction was in the range of 35%   to 40%. There is akinesis of the mid-apicalanteroseptal and   apical myocardium. Doppler parameters are consistent with   abnormal left ventricular relaxation (grade 1 diastolic   dysfunction).  Impressions: - Akinesis of the distal septum and apex with overall moderate LV   dysfunction; mild diastolic dysfunction; possible small, layered   thrombus at LV apex noted using definity; also evidence of   swirling at LV apex suggestive of low flow state; consider   anticoagulation.  Laboratory Data:  Chemistry Recent Labs  Lab 09/18/18 1239  NA 140  K 4.5  CL 107  CO2 23  GLUCOSE 149*  BUN 29*  CREATININE 2.10*  CALCIUM 9.7  GFRNONAA 31*  GFRAA 35*  ANIONGAP 10    No results for input(s): PROT, ALBUMIN, AST, ALT, ALKPHOS, BILITOT in the last 168 hours. Hematology Recent Labs  Lab 09/18/18 1239  WBC 8.7  RBC 4.69  HGB 13.1    HCT 42.1  MCV 89.8  MCH 27.9  MCHC 31.1  RDW 13.5  PLT 179   Cardiac EnzymesNo results for input(s): TROPONINI in the last 168 hours.  Recent Labs  Lab 09/18/18 1246  TROPIPOC 0.00    BNPNo results for input(s): BNP, PROBNP in the last 168 hours.  DDimer No results for input(s): DDIMER in the last 168 hours.  Radiology/Studies:  Dg Chest 2 View  Result Date: 09/18/2018 CLINICAL DATA:  Left-sided and central chest pain for 1 day EXAM: CHEST - 2 VIEW COMPARISON:  Chest radiograph 08/20/2017 FINDINGS: Monitoring leads overlie the patient. Stable cardiomegaly. Tortuosity of the thoracic aorta. No consolidative pulmonary opacities. No pleural effusion or pneumothorax. IMPRESSION: No active cardiopulmonary disease. Electronically Signed   By: Lovey Newcomer M.D.   On: 09/18/2018 13:42    Assessment and Plan:   Atypical Chest Pain  - Patient presents for evaluation of very brief episodes of short stabbing substernal chest pain that last for a total of 5 minutes. - EKG showed no acute ischemic changes compared to prior tracings. - I-stat troponin negative. - Patient currently chest pain free. - Chest pain very atypical. Will repeat troponin. If negative, can be discharged home. Patient already has follow-up with primary Cardiologist  scheduled for 10/15/2018. Discussed with MD.  CAD - Patient has a history of inferior STEMI with DES to RCA in 2013 and anteroapical STEMI s/p DES to the LAD and then staged PCI with DES to LCx and first OM in 08/2017. - Continue Plavix daily. Not on dual antiplatelet therapy due to Eliquis for LV thrombus. - Continue statin and beta-blocker.   Ischemic Cardiomyopathy and LV Thrombus - Last Echocardiogram in 01/2018 showed LVEF of 35-40% with akinesis of the distal septum and apex with overall moderate LV dysfunction. Possible small layered thrombus noted at LV apex with evidence of swirling suggestive of low flow state.  - Patient does not appear volume  overloaded on exam. - Continue home Lasix 20mg  daily. - Continue home beta-blocker and ARB. - Continue anticoagulation with Eliquis.  - Follow-up with primary Cardiologist as directed.   Hypertension - Most recent BP 131/59. - Continue home medications.  CKD Stage IV - Serum creatinine 2.10. Has been anywhere from 1.36 to 2.70 over the last year.   For questions or updates, please contact [place] Please consult [email or web address] for contact info under        Signed, Darreld Mclean, PA-C  09/18/2018 3:51 PM   I have personally seen and examined this patient with Sande Rives, PA-C. I agree with the assessment and plan as outlined above. Mr. Noguez has a history of CAD with most recent coronary stenting in December 2018, CKD, LV thrombus on Eliquis, ischemic cardiomyopathy, HTN presenting to the ED with c/o an episode of chest pain that lasted for 1-2 seconds while scratching off lottery tickets. No associated dyspnea, dizziness, nausea or vomiting. No recurrence of chest pain. EKG without ischemic changes. EKG is personally reviewed by me and shows sinus with RBBB and old inferior infarct. Labs reviewed by me. Troponin negative x 1.  He has no complaints at this time.  My exam: General: Well developed, well nourished, NAD  HEENT: OP clear, mucus membranes moist  SKIN: warm, dry. No rashes. Neuro: No focal deficits  Musculoskeletal: Muscle strength 5/5 all ext  Psychiatric: Mood and affect normal  Neck: No JVD, no carotid bruits, no thyromegaly, no lymphadenopathy.  Lungs:Clear bilaterally, no wheezes, rhonci, crackles Cardiovascular: Regular rate and rhythm. No murmurs, gallops or rubs. Abdomen:Soft. Bowel sounds present. Non-tender.  Extremities: No lower extremity edema. Pulses are 2 + in the bilateral DP/PT.  Plan: Atypical chest pain. No concerning features. His pain lasted for 2 seconds. No evidence of ACS. OK to discharge home after second troponin if troponin is  negative.   Lauree Chandler 09/18/2018 4:06 PM

## 2018-09-18 NOTE — ED Notes (Signed)
Patient verbalizes understanding of discharge instructions. Opportunity for questioning and answers were provided. Armband removed by staff, pt discharged from ED ambulatory.   

## 2018-09-25 ENCOUNTER — Telehealth: Payer: Self-pay | Admitting: Pharmacist Clinician (PhC)/ Clinical Pharmacy Specialist

## 2018-09-25 MED ORDER — CARVEDILOL 12.5 MG PO TABS: 12.5000 mg | ORAL_TABLET | Freq: Two times a day (BID) | ORAL | 0 refills | Status: DC

## 2018-09-25 NOTE — Telephone Encounter (Signed)
Spoke with sister [name] regarding patient carvedilol dose.  He has been getting 6.25 mg bid from [facility]), but has been taking 12.5 mg bid from prescription bottle as well.  Will contact [facility] and have their prepak rx changed for March 1 to have the total 18.75 mg that he is currently taking bid.  Patient received February prepak meds in the mail this morning.  Will also call a 30 day supply of the 12.5 mg to [facility] so he has enough to last with the correct dose until then.

## 2018-09-26 MED ORDER — CARVEDILOL 12.5 MG PO TABS: 18.7500 mg | ORAL_TABLET | Freq: Two times a day (BID) | ORAL | 3 refills | Status: DC

## 2018-09-26 NOTE — Telephone Encounter (Signed)
Spoke with [place] and explained situation.  They will change his pre-pak meds starting Feb 19 to carvedilol 18.75 mg bid.  Also called pt sister and confirmed that he only use the bottled carvedilol until Feb 18.  Sister voiced understanding, however I stated that we would put the information in his AVS next week when he is into see PA.

## 2018-10-01 ENCOUNTER — Encounter: Payer: Self-pay | Admitting: Physician Assistant

## 2018-10-01 ENCOUNTER — Ambulatory Visit (INDEPENDENT_AMBULATORY_CARE_PROVIDER_SITE_OTHER): Payer: Medicare Other | Admitting: Physician Assistant

## 2018-10-01 VITALS — BP 132/86 | HR 77 | Ht 69.0 in | Wt 252.0 lb

## 2018-10-01 DIAGNOSIS — I255 Ischemic cardiomyopathy: Secondary | ICD-10-CM

## 2018-10-01 DIAGNOSIS — E785 Hyperlipidemia, unspecified: Secondary | ICD-10-CM | POA: Diagnosis not present

## 2018-10-01 DIAGNOSIS — Z79899 Other long term (current) drug therapy: Secondary | ICD-10-CM

## 2018-10-01 DIAGNOSIS — N179 Acute kidney failure, unspecified: Secondary | ICD-10-CM

## 2018-10-01 DIAGNOSIS — I10 Essential (primary) hypertension: Secondary | ICD-10-CM | POA: Diagnosis not present

## 2018-10-01 DIAGNOSIS — I2510 Atherosclerotic heart disease of native coronary artery without angina pectoris: Secondary | ICD-10-CM

## 2018-10-01 MED ORDER — FUROSEMIDE 20 MG PO TABS: 20.0000 mg | ORAL_TABLET | ORAL | 5 refills | Status: DC

## 2018-10-01 MED ORDER — NITROGLYCERIN 0.4 MG SL SUBL: 0.4000 mg | SUBLINGUAL_TABLET | SUBLINGUAL | 2 refills | Status: DC | PRN

## 2018-10-01 MED ORDER — POTASSIUM CHLORIDE CRYS ER 10 MEQ PO TBCR: 10.0000 meq | EXTENDED_RELEASE_TABLET | ORAL | 5 refills | Status: DC

## 2018-10-01 NOTE — Progress Notes (Signed)
Cardiology Office Note    Date:  10/03/2018   ID:  TANVIR HIPPLE, DOB 07/08/1946, MRN [id number]  PCP:  Hayden Rasmussen, MD  Cardiologist:  Dr. Claiborne Billings   Chief Complaint  Patient presents with  . Follow-up    seen for Dr. Claiborne Billings    History of Present Illness:  Bobby [name] is a 73 y.o. male with PMH of DM II, HTN, HLD, pulmonary HTN, remote small TIA/CVA and CAD.He had inferior MI in December 2013 and underwent DES to RCA. Echocardiogram done at the time showed EF 50-60% with mid to basal inferior probable scar in the moderate pulmonary hypertension with PA pressure 45 mmHg. Last Myoview obtained on 03/26/2014 was low risk showed normal LV function in the normal wall motion. Fixed inferior defect most consistent with diaphragmatic attenuation, no evidence of ischemia. Abdominal ultrasound in 2017 showed normal abdominal aorta. He was last seen by Dr.Kellyon 08/02/2016, hisBrilintawas transitioned to 60 mg twice a day.  He had a echocardiogram in October 2018 that showed EF 55 to 60%, mild LVH, grade 2 DD. He went to the hospital again on 08/09/2017 with anterior STEMI due to acute occlusion of mid LAD at the second diagonal. He underwent successful PCI of LAD with 2.5 x 22 mm Onyx DES. He was also noted to have a 99% dominant mid left circumflex lesion with 80% stenosis in large OM1. He returned to the Cath Lab on 08/12/2017 and underwent DES to mid left circumflex and another DES to OM1. Troponin peaked at greater than 65. Echocardiogram obtained on 08/09/2017 showed EF 30-35%, new large aneurysm involving mid anterior septal, anterior and apical region consistent with infarct in LAD territory,Grade 1 DD.  Afterward, beta-blocker was held due to bradycardia.  He was readmitted to the hospital in December 2018 and underwent diuresis for heart failure and also treated for COPD exacerbation.  Last echocardiogram obtained on 01/20/2018 showed EF 35 to 40%, grade 1 DD, akinesis of the mid apical  anteroseptal and apical myocardium.  There was evidence of swelling at the LV apex suggestive of low flow state.  He was initially started on Coumadin, then later transitioned to Eliquis.  His last office visit with Dr. Claiborne Billings was on 06/16/2018, at which time he was doing well.  He was having some noncompliance issues with his CPAP machine.  More recently, patient was seen in the hypertension clinic.  Patient recently presented to the emergency room on 09/18/2018 for evaluation of chest pain.  Troponin was negative, his chest pain was felt to be very atypical.  He describes very brief episodes of short stabbing substernal chest pain.  Patient presents today for cardiology office visit.  He has not had any further chest pain since he left the hospital.  No further ischemic work-up is planned at this time.  On further review, his renal function has worsened on the most recent lab work, on physical exam, he appears to be euvolemic, I will switch his Lasix and potassium to every other day instead.  He will need a 2-week basic metabolic panel.  He has a follow-up with Dr. Claiborne Billings in February.  Otherwise, it is unclear to me whether the patient is taking losartan or valsartan.  I am fine with either one as it is controlling his blood pressure quite well.    Past Medical History:  Diagnosis Date  . CAD (coronary artery disease),residual non obstructive disease    a. 08/2012 Inferior STEMI: RCA 99% (3.5x32 Promus  DES), nonobs LAD/LCX dzs;  b. 01/2013 MV: EF 66%, small inferoseptal artifact and possible small inferobasilar scar;  c. 03/2014 MV: fixed inf defect->diaph attenuation, no ischemia. 12/18 PCI/DES to mLAD, staged PCI/DES Lcx/OM  . DM (diabetes mellitus), type 2 new diagnosis 08/05/2012   diet control  . ED (erectile dysfunction)    surgery planned  . History of echocardiogram    a. 08/2012 Echo: EF 55-60%, possible mild basal inferior HK, mildly dil RA, PASP 42mmHg.  [name] Hyperlipidemia 08/05/2012  .  Hypertensive heart disease   . MI (myocardial infarction) ([place]) 08/04/2012   acute inferior stemi secondary to RCA occlusion; PCI  . Panic attack   . Pulmonary HTN ([place])    echo 08/05/12, EF 55-60%, PA pressure 85mm  . S/P coronary artery stent placement, to RCA Promus DES 08/05/2012  . Stroke [facility])    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  . Tobacco abuse     Past Surgical History:  Procedure Laterality Date  . CARDIAC CATHETERIZATION  08/04/12   PCI to RCA with DES  . CORONARY STENT INTERVENTION N/A 08/12/2017   Procedure: CORONARY STENT INTERVENTION;  Surgeon: Martinique, Peter M, MD;  Location: [facility];  Service: Cardiovascular;  Laterality: N/A;  . CORONARY/GRAFT ACUTE MI REVASCULARIZATION N/A 08/09/2017   Procedure: Coronary/Graft Acute MI Revascularization;  Surgeon: Belva Crome, MD;  Location: [facility];  Service: Cardiovascular;  Laterality: N/A;  . LEFT HEART CATH AND CORONARY ANGIOGRAPHY N/A 08/09/2017   Procedure: LEFT HEART CATH AND CORONARY ANGIOGRAPHY;  Surgeon: Belva Crome, MD;  Location: [facility];  Service: Cardiovascular;  Laterality: N/A;  . LEFT HEART CATHETERIZATION WITH CORONARY ANGIOGRAM N/A 08/04/2012   Procedure: LEFT HEART CATHETERIZATION WITH CORONARY ANGIOGRAM;  Surgeon: Troy Sine, MD;  Location: [facility] CATH LAB;  Service: Cardiovascular;  Laterality: N/A;  . LEG SURGERY Left    rod placed for fracture repair  . PENILE PROSTHESIS IMPLANT N/A 06/03/2014   Procedure: IMPLANT PENILE PROTHESIS INFLATABLE;  Surgeon: Ailene Rud, MD;  Location: [facility];  Service: Urology;  Laterality: N/A;  with penile block--0.5% marcaine plain  . PERCUTANEOUS CORONARY STENT INTERVENTION (PCI-S) Right 08/04/2012   Procedure: PERCUTANEOUS CORONARY STENT INTERVENTION (PCI-S);  Surgeon: Troy Sine, MD;  Location: [facility] CATH LAB;  Service: Cardiovascular;  Laterality: Right;  . THUMB ARTHROSCOPY Left    thumb joint replaced    Current  Medications: Outpatient Medications Prior to Visit  Medication Sig Dispense Refill  . albuterol (PROVENTIL HFA;VENTOLIN HFA) 108 (90 Base) MCG/ACT inhaler Inhale 2 puffs into the lungs every 6 (six) hours as needed for wheezing or shortness of breath. 1 Inhaler 2  . albuterol (PROVENTIL) (2.5 MG/3ML) 0.083% nebulizer solution Take 5 mg by nebulization every 4 (four) hours as needed. For wheezing or SOB    . apixaban (ELIQUIS) 5 MG TABS tablet Take 1 tablet (5 mg total) by mouth 2 (two) times daily. 180 tablet 3  . atorvastatin (LIPITOR) 80 MG tablet Take 1 tablet (80 mg total) by mouth daily at 6 PM. 90 tablet 3  . buPROPion (WELLBUTRIN XL) 300 MG 24 hr tablet Take 300 mg by mouth daily.    . carvedilol (COREG) 12.5 MG tablet Take 1.5 tablets (18.75 mg total) by mouth 2 (two) times daily. 180 tablet 3  . clonazePAM (KLONOPIN) 0.5 MG tablet Take 1 mg by mouth 2 (two) times daily.   0  . clopidogrel (PLAVIX) 75 MG tablet Take 1 tablet (75  mg total) by mouth daily. 90 tablet 3  . COLCRYS 0.6 MG tablet Take 0.6 mg by mouth daily.  3  . metFORMIN (GLUCOPHAGE-XR) 500 MG 24 hr tablet Take 500 mg by mouth daily.  3  . Omega-3 Fatty Acids (FISH OIL PO) Take 1 tablet by mouth daily.    [name] oxyCODONE-acetaminophen (PERCOCET) 10-325 MG per tablet Take 1 tablet by mouth 3 (three) times daily.     . sertraline (ZOLOFT) 100 MG tablet Take 100 mg by mouth daily.    . Tiotropium Bromide-Olodaterol (STIOLTO RESPIMAT) 2.5-2.5 MCG/ACT AERS Inhale 2 puffs into the lungs daily.    . valsartan (DIOVAN) 40 MG tablet Take 1 tablet (40 mg total) by mouth 2 (two) times daily. 60 tablet 3  . furosemide (LASIX) 20 MG tablet Take 1 tablet (20 mg total) by mouth daily. 90 tablet 3  . nitroGLYCERIN (NITROSTAT) 0.4 MG SL tablet Place 1 tablet (0.4 mg total) under the tongue every 5 (five) minutes x 3 doses as needed for chest pain. 25 tablet 2  . potassium chloride (K-DUR,KLOR-CON) 10 MEQ tablet Take 1 tablet (10 mEq total) by  mouth daily. 90 tablet 3  . losartan (COZAAR) 50 MG tablet Take 1 tablet by mouth 2 (two) times daily.     No facility-administered medications prior to visit.      Allergies:   Hydrocodone   Social History   Socioeconomic History  . Marital status: Legally Separated    Spouse name: Not on file  . Number of children: Not on file  . Years of education: Not on file  . Highest education level: Not on file  Occupational History  . Not on file  Social Needs  . Financial resource strain: Not on file  . Food insecurity:    Worry: Not on file    Inability: Not on file  . Transportation needs:    Medical: Not on file    Non-medical: Not on file  Tobacco Use  . Smoking status: Current Every Day Smoker    Packs/day: 1.00    Years: 56.00    Pack years: 56.00    Types: Cigarettes  . Smokeless tobacco: Never Used  . Tobacco comment: smoking now 1 ppd  Substance and Sexual Activity  . Alcohol use: Yes    Comment: ocasionally  . Drug use: No  . Sexual activity: Not on file  Lifestyle  . Physical activity:    Days per week: Not on file    Minutes per session: Not on file  . Stress: Not on file  Relationships  . Social connections:    Talks on phone: Not on file    Gets together: Not on file    Attends religious service: Not on file    Active member of club or organization: Not on file    Attends meetings of clubs or organizations: Not on file    Relationship status: Not on file  Other Topics Concern  . Not on file  Social History Narrative  . Not on file     Family History:  The patient's family history includes Heart attack in his paternal grandmother; Heart disease in his maternal grandmother; Kidney disease in his father; Stomach cancer in his maternal grandfather.   ROS:   Please see the history of present illness.    ROS All other systems reviewed and are negative.   PHYSICAL EXAM:   VS:  BP 132/86   Pulse 77   Ht 5\' 9"  (  1.753 m)   Wt 252 lb (114.3 kg)   SpO2  94%   BMI 37.21 kg/m    GEN: Well nourished, well developed, in no acute distress  HEENT: normal  Neck: no JVD, carotid bruits, or masses Cardiac: RRR; no murmurs, rubs, or gallops,no edema  Respiratory:  clear to auscultation bilaterally, normal work of breathing GI: soft, nontender, nondistended, + BS MS: no deformity or atrophy  Skin: warm and dry, no rash Neuro:  Alert and Oriented x 3, Strength and sensation are intact Psych: euthymic mood, full affect  Wt Readings from Last 3 Encounters:  10/01/18 252 lb (114.3 kg)  09/18/18 250 lb (113.4 kg)  06/16/18 247 lb (112 kg)      Studies/Labs Reviewed:   EKG:  EKG is not ordered today.    Recent Labs: 01/13/2018: ALT 30; BNP 65.7 02/21/2018: TSH 0.598 09/18/2018: BUN 29; Creatinine, Ser 2.10; Hemoglobin 13.1; Platelets 179; Potassium 4.5; Sodium 140   Lipid Panel    Component Value Date/Time   CHOL 148 01/13/2018 0839   TRIG 265 (H) 01/13/2018 0839   HDL 36 (L) 01/13/2018 0839   CHOLHDL 4.1 01/13/2018 0839   CHOLHDL 4.8 08/09/2017 1117   VLDL 52 (H) 08/09/2017 1117   LDLCALC 59 01/13/2018 0839    Additional studies/ records that were reviewed today include:   Echo 01/20/2018 LV EF: 35% -   40% Study Conclusions  - Left ventricle: The cavity size was normal. Wall thickness was   increased in a pattern of mild LVH. There was mild focal basal   hypertrophy of the septum. Systolic function was moderately   reduced. The estimated ejection fraction was in the range of 35%   to 40%. There is akinesis of the mid-apicalanteroseptal and   apical myocardium. Doppler parameters are consistent with   abnormal left ventricular relaxation (grade 1 diastolic   dysfunction).  Impressions:  - Akinesis of the distal septum and apex with overall moderate LV   dysfunction; mild diastolic dysfunction; possible small, layered   thrombus at LV apex noted using definity; also evidence of   swirling at LV apex suggestive of low flow  state; consider   anticoagulation.    ASSESSMENT:    1. Coronary artery disease involving native coronary artery of native heart without angina pectoris   2. Medication management   3. Essential hypertension   4. Hyperlipidemia LDL goal <70   5. Ischemic cardiomyopathy   6. AKI (acute kidney injury) ([place])      PLAN:  In order of problems listed above:  1. CAD: Due to significant anterior MI, his EF is 35 to 54s.  On aspirin given the need for Eliquis.  Continue Plavix along with Eliquis.  Recently was seen in the ED for brief stabbing chest pain, this has completely resolved.  The chest pain only lasted a few seconds each time and does not occur with exertion.  Troponin in the hospital was normal.  No further work-up is planned.  2. Ischemic cardiomyopathy: He is on Eliquis as previous echocardiogram demonstrated swirling in the LV apex concerning for hypercoagulable state and the development of LV thrombus after the anterior MI.  3. Hypertension: Blood pressure stable on current therapy  4. Hyperlipidemia: Continue Lipitor 80 mg daily  5. AKI: Recent ED lab work does show that his troponin has trended up to 2.1.  I asked him to change the Lasix to every other day along with potassium.  He will need a lab  work in 2 weeks.  He is aware that if he does develop increased shortness of breath he may take additional dose of Lasix along with potassium as needed.    Medication Adjustments/Labs and Tests Ordered: Current medicines are reviewed at length with the patient today.  Concerns regarding medicines are outlined above.  Medication changes, Labs and Tests ordered today are listed in the Patient Instructions below. Patient Instructions  Medication Instructions:   START LASIX EVERY OTHER DAY START POTASSIUM EVERY OTHER DAY  MAY TAKE AN EXTRA LASIX AS NEEDED FOR SWELLING OR SHORTNESS OF BREATH  If you need a refill on your cardiac medications before your next appointment, please  call your pharmacy.   Lab work: [employer] IN 2 WEEKS   If you have labs (blood work) drawn today and your tests are completely normal, you will receive your results only by: [name] MyChart Message (if you have MyChart) OR . A paper copy in the mail If you have any lab test that is abnormal or we need to change your treatment, we will call you to review the results.  Testing/Procedures:  NONE   Follow-Up: Your physician recommends that you KEEP your scheduled appointment with Shelva Majestic, MD   Any Other Special Instructions Will Be Listed Below (If Applicable).  Please call our office to let us know if you are taking the Losartan or Valsartan      Weston Brass [name], [place]  10/03/2018 11:50 PM    [address], [place], [place]  [address] Phone: [phone]; Fax: [phone]

## 2018-10-01 NOTE — Patient Instructions (Addendum)
Medication Instructions:   START LASIX EVERY OTHER DAY START POTASSIUM EVERY OTHER DAY  MAY TAKE AN EXTRA LASIX AS NEEDED FOR SWELLING OR SHORTNESS OF BREATH  If you need a refill on your cardiac medications before your next appointment, please call your pharmacy.   Lab work: [employer] IN 2 WEEKS   If you have labs (blood work) drawn today and your tests are completely normal, you will receive your results only by: [name] MyChart Message (if you have MyChart) OR . A paper copy in the mail If you have any lab test that is abnormal or we need to change your treatment, we will call you to review the results.  Testing/Procedures:  NONE   Follow-Up: Your physician recommends that you KEEP your scheduled appointment with Shelva Majestic, MD   Any Other Special Instructions Will Be Listed Below (If Applicable).  Please call our office to let us know if you are taking the Losartan or Valsartan

## 2018-10-02 ENCOUNTER — Telehealth: Payer: Self-pay | Admitting: Physician Assistant

## 2018-10-02 NOTE — Telephone Encounter (Signed)
New Message   Patient was told to call with medicine list and would like to speak to a nurse.

## 2018-10-02 NOTE — Telephone Encounter (Signed)
Spoke with vicky, patient is taking both valsartan 40 mg twice daily and losartan 50 mg twice daily. All the medications were confirmed. Will forward to hao meng pa to review and advise

## 2018-10-03 ENCOUNTER — Encounter: Payer: Self-pay | Admitting: Physician Assistant

## 2018-10-03 ENCOUNTER — Ambulatory Visit: Payer: Medicare Other | Admitting: Physician Assistant

## 2018-10-03 NOTE — Telephone Encounter (Signed)
Attempted to call patient, he wish me to tell his sister. I called Mrs. Olegario Shearer, she did not pick up. He should be on valsartan. Stop losartan. Please inform patient

## 2018-10-06 NOTE — Telephone Encounter (Signed)
Spoke with vicky, aware to stop the losartan.

## 2018-10-13 ENCOUNTER — Other Ambulatory Visit: Payer: Self-pay | Admitting: *Deleted

## 2018-10-13 DIAGNOSIS — I5021 Acute systolic (congestive) heart failure: Secondary | ICD-10-CM

## 2018-10-14 LAB — BASIC METABOLIC PANEL
BUN/Creatinine Ratio: 18 (ref 10–24)
BUN: 37 mg/dL — ABNORMAL HIGH (ref 8–27)
CO2: 19 mmol/L — ABNORMAL LOW (ref 20–29)
Calcium: 9.7 mg/dL (ref 8.6–10.2)
Chloride: 104 mmol/L (ref 96–106)
Creatinine, Ser: 2.07 mg/dL — ABNORMAL HIGH (ref 0.76–1.27)
GFR calc Af Amer: 36 mL/min/{1.73_m2} — ABNORMAL LOW (ref >59)
GFR calc non Af Amer: 31 mL/min/{1.73_m2} — ABNORMAL LOW (ref >59)
Glucose: 97 mg/dL (ref 65–99)
Potassium: 5.4 mmol/L — ABNORMAL HIGH (ref 3.5–5.2)
Sodium: 139 mmol/L (ref 134–144)

## 2018-10-15 ENCOUNTER — Ambulatory Visit (INDEPENDENT_AMBULATORY_CARE_PROVIDER_SITE_OTHER): Payer: Medicare Other | Admitting: Cardiovascular Disease

## 2018-10-15 ENCOUNTER — Telehealth: Payer: Self-pay

## 2018-10-15 ENCOUNTER — Encounter: Payer: Self-pay | Admitting: Cardiovascular Disease

## 2018-10-15 VITALS — BP 112/72 | HR 71 | Ht 69.0 in | Wt 256.8 lb

## 2018-10-15 DIAGNOSIS — I5022 Chronic systolic (congestive) heart failure: Secondary | ICD-10-CM | POA: Diagnosis not present

## 2018-10-15 DIAGNOSIS — N183 Chronic kidney disease, stage 3 unspecified: Secondary | ICD-10-CM

## 2018-10-15 DIAGNOSIS — Z79899 Other long term (current) drug therapy: Secondary | ICD-10-CM

## 2018-10-15 DIAGNOSIS — I2510 Atherosclerotic heart disease of native coronary artery without angina pectoris: Secondary | ICD-10-CM

## 2018-10-15 DIAGNOSIS — I255 Ischemic cardiomyopathy: Secondary | ICD-10-CM

## 2018-10-15 DIAGNOSIS — E785 Hyperlipidemia, unspecified: Secondary | ICD-10-CM | POA: Diagnosis not present

## 2018-10-15 DIAGNOSIS — I10 Essential (primary) hypertension: Secondary | ICD-10-CM

## 2018-10-15 DIAGNOSIS — G4733 Obstructive sleep apnea (adult) (pediatric): Secondary | ICD-10-CM

## 2018-10-15 DIAGNOSIS — Z7901 Long term (current) use of anticoagulants: Secondary | ICD-10-CM

## 2018-10-15 MED ORDER — FUROSEMIDE 20 MG PO TABS: 20.0000 mg | ORAL_TABLET | ORAL | 5 refills | Status: DC | PRN

## 2018-10-15 MED ORDER — CARVEDILOL 12.5 MG PO TABS: 18.7500 mg | ORAL_TABLET | Freq: Two times a day (BID) | ORAL | 3 refills | Status: DC

## 2018-10-15 NOTE — Progress Notes (Signed)
Called the numbers listed for the patient could not leave a message, will try calling back another time.

## 2018-10-15 NOTE — Telephone Encounter (Signed)
Called patient to try to give lab results. Could not leave voice message.

## 2018-10-15 NOTE — Patient Instructions (Signed)
Medication Instructions:  Only take Lasix as needed. Stop Potassium   If you need a refill on your cardiac medications before your next appointment, please call your pharmacy.   Lab work: CMET, LIPID, TSH in one week. If you have labs (blood work) drawn today and your tests are completely normal, you will receive your results only by: [name] MyChart Message (if you have MyChart) OR . A paper copy in the mail If you have any lab test that is abnormal or we need to change your treatment, we will call you to review the results.  Follow-Up: At [facility], you and your health needs are our priority.  As part of our continuing mission to provide you with exceptional heart care, we have created designated Provider Care Teams.  These Care Teams include your primary Cardiologist (physician) and Advanced Practice Providers (APPs -  Physician Assistants and Nurse Practitioners) who all work together to provide you with the care you need, when you need it. Follow up with [name], PA-C in 3 months  Follow up with Dr.Kelly in 6 months

## 2018-10-15 NOTE — Progress Notes (Signed)
Patient ID: Bobby [name], male   DOB: Sep 05, 1945, 73 y.o.   MRN: [id number]    HPI: Bobby [name] is a 73 y.o. male presents to the office today for a 4 month followup evaluation.   [name]  has a long-standing tobacco history having started smoking at age 61, a history of hypertension, remote small TIA/CVA who presented to the hospital on 08/04/2012 in the setting of inferior ST segment elevation myocardial infarction. Catheterization by me revealed a 99% stenosis of the RCA and concomitant CAD involving his LAD and circumflex vessels. He underwent acute percutaneous coronary intervention with an excellent door to balloon time of only 20 minutes and ultimately insertion of a 3.25x32 mm DES stent post dilated 3.3. An echo done in the hospital showed an EF of 50-60% with mild basal inferior probable scar and moderate pulmonary hypertension with PA pressure of 45 mm.  When I saw [name] in May 2014, he did note shortness of breath with activity. He denied definitive chest pain. At that time, he realized that he had inadvertentlystopped taking the atorvastatin 40 mg dose and this was resumed. In addition, his Toprol dose was increased from 50 to 75 mg daily. A nuclear study showed an ejection fraction of 66%. Perfusion was essentially normal with exception of a small region of fixed inferoseptal bowel artifact and possible small area of inferobasilar scar.   [name] unfortunately continues to smoke at least a pack of cigarettes per day.   He does experience shortness of breath with activity.  He denies recent chest pain.  He has chronic right bundle branch block with repolarization changes.  He is diabetic and has pulmonary hypertension.  A follow-up nuclear perfusion study on 03/26/2014 remianed low risk and showed normal LV function and normal wall motion.  There was a fixed inferior defect that was felt most consistent with diaphragmatic attenuation.  There was no evidence for ischemia.  He underwent  prostate surgery by Dr. [name] without cardiovascular compromise.    A screening abdominal aortic ultrasound revealed a normal abdominal aorta.  Since I  saw him in November 2017, he has had an extensive history and has had recurrent hospitalizations he was hospitalized in October 2018 for left arm pain.  Troponin was negative.  In December 2018 he suffered an anterior ST segment elevation MI due to acute occlusion of his mid LAD and underwent successful PCI of his LAD with insertion of a 2.5 x 22 mm Resolute Onyx DES stent.  He was also found to have 99% dominant mid left circumflex lesion with 80% stenosis in the OM1 vessel and underwent staged intervention to the mid circumflex and marginal vessels.  Troponin was greater than 65.  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  There was mention of sludge is a sign of pre-thrombotic state but no definite thrombus was seen at the apex.  He returned to the hospital August 20, 2017 with malaise and dyspnea.  He was bradycardic.  He was diuresed.  He has renal insufficiency with creatinines increasing up to 1.9.  He has been followed by [name] on numerous occasions in the office.  Unfortunately continues to smoke cigarettes.  He admits to 1/2 pack/day.  He does note occasional dizziness as well as some shortness of breath.  He denies recurrent anginal type symptoms.   Suggestive of low flow state.  I saw him for  evaluation on Jan 07, 2018.  At that time, I recommended that he undergo a follow-up echo Doppler study to reassess systolic and diastolic function as well as potential for apical thrombus formation.  He had continued to be on aspirin and Brilinta following his STEMI.  He was on atorvastatin 80 mg for hyperlipidemia with target less than 70.  I had a long discussion regarding smoking cessation.  He underwent a follow-up echo Doppler study on Jan 20, 2018.  This showed reduced LV  function with an EF of 35 to 40% with akinesis of the mid apical anterolateral and septal as well as apical walls.  There was grade 1 diastolic dysfunction.  There was now a possible small layered thrombus at the apex noted using Definity and also evidence of swirling at the LV apex suggestive of low flow state.   As result of the echo findings, I saw him in follow-up on Jan 24, 2018 at which time I had a very long discussion with him guarding potential thromboembolic stroke risk.  He was started on warfarin anticoagulation.  Apparently, the patient's medications have been in a punch pack and had been followed by his girlfriend.  Apparently the patient has not been taking his medications correctly.  The result is been significant over anticoagulation such that his INR reached greater than 10.  He has been followed very closely by our pharmacy department and he also was given vitamin K.  Due to his poor medication compliance after much discussion ultimately the thought is for him to transition to Eliquis once his INR gets below 2.  He also has been taking his blood pressure medicines incorrectly.  Patient has been sleeping most of the day.  In the past I had recommended he undergo a sleep evaluation for sleep apnea which he never followed up with it was a no-show in the sleep lab.  He was worked into my schedule on February 21, 2018 and was seen as an add-on due to his medication issues.  He was here now with his sister who in the past had done an excellent job in caring for his medications and she is committed now to resume doing this for him.  During his last evaluation, he was exceptionally somnolent, and a significant time was spent with medication adjustment.  Due to his inability to take effect of warfarin the decision was made to use Eliquis for anticoagulation with more consistent dosing.  His recent laboratory had shown stage IV chronic kidney disease and it was recommended that he hold his lisinopril for  several days and then reduce the dose to 5 mg as well as holding his furosemide with subsequent reduction of dose with reinstitution at 20 mg.  I scheduled him for an expeditious sleep study due to concerns for significant sleep apnea.  Is also seen 5 days later by Racquel with medication adjustment and anticoagulation issues.  He underwent a sleep study on February 25, 2018 and he was found to have moderate overall sleep apnea with an AHI of 22.2 and RDI of 25.4.  He could not achieve any rem sleep and the overall severity may very well be underestimated.  He was titrated up to 10 cm water pressure.  An initial trial of CPAP auto with an EPR range of 8-15 was recommended.  When I last saw him on March 19, 2018 he had he had just initiated CPAP the evening before noted significant improvement in his sleep. His renal function had significantly  improved from a creatinine of 2.7 down to 1.362 weeks ago.  He is unaware of any recurrent episodes of arrhythmia.  Apparently his blood pressure had become elevated and Dr. Darron Doom added losartan to his medical regimen.  Of note, he also already was on lisinopril 2.5 mg, furosemide 20 mg, carvedilol 6.25 mg twice a day.  He has been only intermittently using CPAP due to complaints of the mask on his nose bridge.  As result, he was recently changed to a different mask.  A download was obtained in the office today in which he does not meet compliance.  [employer] is his DME company.  His set up date was March 17, 2018 and he has until June 17, 2018 to demonstrate compliance.    When I last saw him in September 2019 his blood pressure was elevated.  At that time apparently he was on both the previous lisinopril and the recent losartan that was started by Dr. Darron Doom.  I suggested he discontinue lisinopril and further titrated losartan to 25 mg twice a day both for improved blood pressure and for his reduced LV function.  He was not compliant with reference to his CPAP therapy  and I had a long discussion with him regarding the importance that he meet compliance by October 15.  I last saw him in October 2019.  At that time he was not using his CPAP therapy.  Reviewed his blood pressure recordings and they seem to be consistently elevated.  He continues to be on Eliquis and was taking Lasix 20 mg.  That evaluation I had a long discussion with him guarding his noncompliance with CPAP.  I discussed alternatives such as customized oral appliance.  We discussed with his LV dysfunction he may be a candidate for Jardiance if his renal function is stable.  Was maintaining sinus rhythm.  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  His ECG was unchanged.  Troponins were negative.  He was discharged from the ER and seen by [name], [facility] on October 01, 2018.  He had not had any further chest pain since he left the hospital and no ischemic work-up was planned.  His renal function had worsened and his furosemide was changed to every other day.  Visibly he feels well and has not had recurrent chest pain.  He uses oxygen at 2 L nasal cannula at nighttime instead of his CPAP therapy.  Laboratory 2 days prior to his office visit has shown a creatinine of 2.07 with a BUN of 37.  Potassium was 5.4.  He presents for evaluation.  Past Medical History:  Diagnosis Date  . CAD (coronary artery disease),residual non obstructive disease    a. 08/2012 Inferior STEMI: RCA 99% (3.5x32 Promus DES), nonobs LAD/LCX dzs;  b. 01/2013 MV: EF 66%, small inferoseptal artifact and possible small inferobasilar scar;  c. 03/2014 MV: fixed inf defect->diaph attenuation, no ischemia. 12/18 PCI/DES to mLAD, staged PCI/DES Lcx/OM  . DM (diabetes mellitus), type 2 new diagnosis 08/05/2012   diet control  . ED (erectile dysfunction)    surgery planned  . History of  echocardiogram    a. 08/2012 Echo: EF 55-60%, possible mild basal inferior HK, mildly dil RA, PASP 73mHg.  .Marland KitchenHyperlipidemia 08/05/2012  . Hypertensive heart disease   . [place]  08/[place]2012   acute inferior stemi secondary to RCA occlusion; PCI  . Panic attack   . Pulmonary HTN ([place])    echo 08/05/12, EF 55-60%, PA pressure 52m  . S/P coronary artery stent placement, to RCA Promus DES 08/05/2012  . Stroke ([facility]    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  . Tobacco abuse     Past Surgical History:  Procedure Laterality Date  . CARDIAC CATHETERIZATION  08/04/12   PCI to RCA with DES  . CORONARY STENT INTERVENTION N/A 08/12/2017   Procedure: CORONARY STENT INTERVENTION;  Surgeon: JMartinique Peter M, MD;  [facility];  Service: Cardiovascular;  Laterality: N/A;  . CORONARY/GRAFT ACUTE MI REVASCULARIZATION N/A 08/09/2017   Procedure: Coronary/Graft Acute MI Revascularization;  Surgeon: SBelva Crome MD;  [facility];  Service: Cardiovascular;  Laterality: N/A;  . LEFT HEART CATH AND CORONARY ANGIOGRAPHY N/A 08/09/2017   Procedure: LEFT HEART CATH AND CORONARY ANGIOGRAPHY;  Surgeon: SBelva Crome MD;  [facility];  Service: Cardiovascular;  Laterality: N/A;  . LEFT HEART CATHETERIZATION WITH CORONARY ANGIOGRAM N/A 08/04/2012   Procedure: LEFT HEART CATHETERIZATION WITH CORONARY ANGIOGRAM;  Surgeon: TTroy Sine MD;  [facility];  Service: Cardiovascular;  Laterality: N/A;  . LEG SURGERY Left    rod placed for fracture repair  . PENILE PROSTHESIS IMPLANT N/A 06/03/2014   Procedure: IMPLANT PENILE PROTHESIS INFLATABLE;  Surgeon: SAilene Rud MD;  Location: [facility];  Service: Urology;  Laterality: N/A;  with penile block--0.5% marcaine plain  . PERCUTANEOUS CORONARY STENT INTERVENTION (PCI-S) Right 08/04/2012   Procedure: PERCUTANEOUS CORONARY STENT INTERVENTION (PCI-S);  Surgeon: TTroy Sine MD;  [facility];  Service: Cardiovascular;  Laterality: Right;  . THUMB ARTHROSCOPY Left    thumb joint replaced    Allergies  Allergen Reactions  . Hydrocodone Itching    Current Outpatient Medications  Medication Sig Dispense Refill  . albuterol (PROVENTIL HFA;VENTOLIN HFA) 108 (90 Base) MCG/ACT inhaler Inhale 2 puffs into the lungs every 6 (six) hours as needed for wheezing or shortness of breath. 1 Inhaler 2  . albuterol (PROVENTIL) (2.5 MG/3ML) 0.083% nebulizer solution Take 5 mg by nebulization every 4 (four) hours as needed. For wheezing or SOB    . apixaban (ELIQUIS) 5 MG TABS tablet Take 1 tablet (5 mg total) by mouth 2 (two) times daily. 180 tablet 3  . atorvastatin (LIPITOR) 80 MG tablet Take 1 tablet (80 mg total) by mouth daily at 6 PM. 90 tablet 3  . buPROPion (WELLBUTRIN XL) 300 MG 24 hr tablet Take 300 mg by mouth daily.    . carvedilol (COREG) 12.5 MG tablet Take 1.5 tablets (18.75 mg total) by mouth 2 (two) times daily. 180 tablet 3  . clonazePAM (KLONOPIN) 0.5 MG tablet Take 1 mg by mouth 2 (two) times daily.   0  . clopidogrel (PLAVIX) 75 MG tablet Take 1 tablet (75 mg total) by mouth daily. 90 tablet 3  . COLCRYS 0.6 MG tablet Take 0.6 mg by mouth daily.  3  . furosemide (LASIX) 20 MG tablet Take 1 tablet (20 mg total) by mouth as needed for edema. 15 tablet 5  . metFORMIN (GLUCOPHAGE-XR) 500 MG 24 hr tablet Take 500 mg by mouth daily.  3  . nitroGLYCERIN (NITROSTAT) 0.4 MG SL tablet Place 1 tablet (0.4 mg total) under the tongue every 5 (five) minutes x 3 doses as needed for chest pain. 25 tablet  2  . Omega-3 Fatty Acids (FISH OIL PO) Take 1 tablet by mouth daily.    [name] oxyCODONE-acetaminophen (PERCOCET) 10-325 MG per tablet Take 1 tablet by mouth 3 (three) times daily.     . sertraline (ZOLOFT) 100 MG tablet Take 100 mg by mouth daily.    . Tiotropium Bromide-Olodaterol (STIOLTO RESPIMAT) 2.5-2.5 MCG/ACT AERS Inhale 2 puffs into the lungs daily.    . valsartan (DIOVAN) 40  MG tablet Take 1 tablet (40 mg total) by mouth 2 (two) times daily. 60 tablet 3   No current facility-administered medications for this visit.     Socially,  he is divorced. He doesn't walk but not routinely exercise.  He continues to smoke one pack of cigarettes per day.  ROS General: Negative; No fevers, chills, or night sweats;  HEENT: Negative; No changes in vision or hearing, sinus congestion, difficulty swallowing Pulmonary:  Positive for shortness of breath; No cough, wheezing, , hemoptysis Cardiovascular: See history of present illness GI: Negative; No nausea, vomiting, diarrhea, or abdominal pain GU: Negative; No dysuria, hematuria, or difficulty voiding Musculoskeletal: Negative; no myalgias, joint pain, or weakness Hematologic/Oncology: Negative; no easy bruising, bleeding Endocrine: Positive for diabetes no heat/cold intolerance;  Neuro: Negative; no changes in balance, headaches Skin: Negative; No rashes or skin lesions Psychiatric: Negative; No behavioral problems, depression Sleep: As it of her obstructive sleep apnea, CPAP initiated March 17, 2018. no bruxism, restless legs, hypnogognic hallucinations, no cataplexy Other comprehensive 14 point system review is negative.   PE BP 112/72   Pulse 71   Ht _0  (1.753 m)   Wt 256 lb 12.8 oz (116.5 kg)   SpO2 98%   BMI 37.92 kg/m    Repeat blood pressure by me was 120/72  Wt Readings from Last 3 Encounters:  10/15/18 256 lb 12.8 oz (116.5 kg)  10/01/18 252 lb (114.3 kg)  09/18/18 250 lb (113.4 kg)     Physical Exam BP 112/72   Pulse 71   Ht _1  (1.753 m)   Wt 256 lb 12.8 oz (116.5 kg)   SpO2 98%   BMI 37.92 kg/m  General: Alert, oriented, no distress.  Skin: normal turgor, no rashes, warm and dry HEENT: Normocephalic, atraumatic. Pupils equal round and reactive to light; sclera anicteric; extraocular muscles intact; Fundi ** Nose without nasal septal hypertrophy Mouth/Parynx benign; Mallinpatti scale  3/4 Neck: No JVD, no carotid bruits; normal carotid upstroke Lungs: clear to ausculatation and percussion; no wheezing or rales Chest wall: without tenderness to palpitation Heart: PMI not displaced, RRR, s1 s2 normal, 1/6 systolic murmur, no diastolic murmur, no rubs, gallops, thrills, or heaves Abdomen: soft, nontender; no hepatosplenomehaly, BS+; abdominal aorta nontender and not dilated by palpation. Back: no CVA tenderness Pulses 2+ Musculoskeletal: full range of motion, normal strength, no joint deformities Extremities: no clubbing cyanosis or edema, Homan's sign negative  Neurologic: grossly nonfocal; Cranial nerves grossly wnl Psychologic: Normal mood and affect   ECG (independently read by me): Sinus rhythm at 65 bpm.  PAC.  Right bundle branch block, inferior Q waves in 3 and aVF, old anterior myocardial infarction.  Lateral T wave abnormality.  Normal intervals.  June 16, 2018 ECG (independently read by me): Normal sinus rhythm at 84 bpm.  Right bundle branch block with repolarization changes.  Inferior Q waves consistent with prior infarct.  Anteroseptal Q waves consistent with prior anterior MI.  May 28, 2018 ECG (independently read by me): Normal sinus rhythm with sinus arrhythmia.  Right bundle branch block with repolarization changes.  Q waves consistent with anteroseptal MI and possible inferior MI no indication to adjust the pain upon his needs  July 17,2019 ECG (independently read by me): Normal sinus rhythm 89 bpm.  Right bundle branch block with repolarization changes.  Inferior Q waves and anterior  Q waves consistent with anterolateral infarct with possible inferior infarct  February 21, 2018 ECG (independently read by me): Normal sinus rhythm at 95 bpm, right bundle branch block with repolarization changes.  Anterolateral MI.  QTc interval 477 ms  Jan 24, 2018 ECG (independently read by me): Sinus rhythm at 97 bpm.  Right bundle branch block with repolarization  changes.  QTc interval 472 ms.  Anterolateral Q waves  Jan 07, 2018 ECG (independently read by me): Normal sinus rhythm at 88 bpm.  Right bundle branch block with repolarization changes.  Inferior Q waves.  T wave anteroseptally, possible anteroseptal MI undetermined.  November 2017 ECG (independently read by me): Normal sinus rhythm at 64 bpm.  Branch block with repolarization changes.  QTc interval 460 ms.  June 2016 ECG (independently read by me): Sinus bradycardia at 57 bpm, right bundle branch block with repositioning changes.  QTc interval 459 ms.  September 2015 ECG (independently read by me and (: Normal sinus rhythm.  Right bundle branch block with repolarization changes.  Ventricular rate 81.  02/25/2014 ECG Normal sinus rhythm.  Right bundle branch block with repolarization changes.  PR interval 148 ms; QTc interval 463 ms  PriorECG: sinus rhythm at 76 beats per minute; right bundle-branch block with repolarization changes. PR interval 142 ms, QTC 481 ms.   LABS:  BMP Latest Ref Rng & Units 10/13/2018 09/18/2018 03/03/2018  Glucose 65 - 99 mg/dL 97 149(H) 131(H)  BUN 8 - 27 mg/dL 37(H) 29(H) 23  Creatinine 0.76 - 1.27 mg/dL 2.07(H) 2.10(H) 1.36(H)  BUN/Creat Ratio 10 - 24 18 - 17  Sodium 134 - 144 mmol/L 139 140 137  Potassium 3.5 - 5.2 mmol/L 5.4(H) 4.5 4.4  Chloride 96 - 106 mmol/L 104 107 112(H)  CO2 20 - 29 mmol/L 19(L) 23 22  Calcium 8.6 - 10.2 mg/dL 9.7 9.7 10.0   Hepatic Function Latest Ref Rng & Units 01/13/2018 08/20/2017 08/10/2017  Total Protein 6.0 - 8.5 g/dL 6.6 6.9 6.1(L)  Albumin 3.5 - 4.8 g/dL 4.2 3.7 3.4(L)  AST 0 - 40 IU/L 22 26 206(H)  ALT 0 - 44 IU/L 30 44 53  Alk Phosphatase 39 - 117 IU/L 129(H) 106 80  Total Bilirubin 0.0 - 1.2 mg/dL <0.2 0.4 0.5   CBC Latest Ref Rng & Units 09/18/2018 02/19/2018 01/13/2018  WBC 4.0 - 10.5 K/uL 8.7 8.5 8.4  Hemoglobin 13.0 - 17.0 g/dL 13.1 12.5(L) 13.5  Hematocrit 39.0 - 52.0 % 42.1 37.2(L) 39.9  Platelets 150 - 400 K/uL  179 186 218   Lab Results  Component Value Date   MCV 89.8 09/18/2018   MCV 88 02/19/2018   MCV 88 01/13/2018   Lab Results  Component Value Date   TSH 0.598 02/21/2018   Lipid Panel     Component Value Date/Time   CHOL 148 01/13/2018 0839   TRIG 265 (H) 01/13/2018 0839   HDL 36 (L) 01/13/2018 0839   CHOLHDL 4.1 01/13/2018 0839   CHOLHDL 4.8 08/09/2017 1117   VLDL 52 (H) 08/09/2017 1117   LDLCALC 59 01/13/2018 0839   INR result since initiation of Coumadin: 1:> 7.6;> >10;> 8.6 (Vit K administered) >  4.3; no longer on Coumadin, now on Eliquis  RADIOLOGY: No results found.  IMPRESSION: 1. Coronary artery disease involving native coronary artery of native heart without angina pectoris   2. Essential hypertension   3. Hyperlipidemia LDL goal <70   4. Chronic systolic heart failure ([place])   5. Ischemic cardiomyopathy   6. Medication management   7. CKD (chronic kidney disease), stage III ([place])   8. OSA (obstructive sleep apnea)   9. Long term (current) use of anticoagulants   10. Hyperlipidemia with target LDL less than 70     ASSESSMENT AND PLAN:  Mr. Vowels is a 73 year old Caucasian male who suffered an acute coronary syndrome on 08/04/2012 when he presented with subtotal occlusion of his RCA. He had diffuse disease beyond the subtotal occlusion and ultimately had successful insertion of a 3.25x32 mm Promus DES stent. He had concomitant CAD with 40-50% proximal LAD narrowing, 30% circumflex marginal stenoses. NMR lipoprofile after initiation of atorvastatin in January 2014 showed marked improvement in total cholesterol of 119 LDL 48 LDL particle #992. HDL was very low at 30 with an increased triglyceride at 203 and significantly reduced HDL particle #24.2. His insulin resistance score was elevated at 65.  [name]  He  suffered an anterior STEMI in December 2018 and underwent successful stenting and several days later he required staged intervention to circumflex and marginal vessel.  Anecho Doppler study in May 2019 was highly suggestive of apical thrombus.  Warfarin was initially prescribed but due to significant issues with compliance and overmedication the decision was made that he would be safer using Eliquis even though he is on this for apical thrombus rather than atrial fibrillation.   He was subsequently found to have significant obstructive sleep apnea but stopped therapy and returned his machine.  He is now using nocturnal oxygen.  He had recently developed somewhat atypical chest pain and no subsequent ischemic evaluation was initiated.  Reviewed his most recent emergency room evaluation and follow-up evaluation in the office.  His blood pressure today is stable and he has been taking furosemide 20 mg every other day, carvedilol 18.75 mg twice a day, valsartan 40 mg.  His creatinine increased but slightly improved from 3 weeks ago at 2.07 with a BUN of 37 I have recommended discontinuance of furosemide.  He will also discontinue his supplemental potassium.  Have recommended repeat comprehensive metabolic panel, lipid studies and TSH in 1 week.  Be obtained in 1 to 2 weeks.  He is maintaining sinus rhythm.  He is not sleepy.  An Epworth Sleepiness Scale score endorsed at 3.  He continues to be on metformin for his diabetes mellitus.  He will follow-up [name], PAC in 2 to 3 months and I will see him in 4 to 6 months for reevaluation.   Time spent: 25 minutes  Troy Sine, MD, [facility]  10/17/2018 8:14 AM

## 2018-10-16 ENCOUNTER — Telehealth: Payer: Self-pay | Admitting: Cardiovascular Disease

## 2018-10-16 NOTE — Progress Notes (Signed)
Called patient to give lab results

## 2018-10-16 NOTE — Telephone Encounter (Signed)
New message   Patient's sister is returning call for lab results.

## 2018-10-16 NOTE — Progress Notes (Signed)
Patient's sister Elicia Lamp returned my call, she is listed on the patient's DPR. She was informed of the lab results and verbalized an understanding and all (if any) questions were answered.

## 2018-10-16 NOTE — Telephone Encounter (Signed)
Called patient's number no answer or voicemail setup for either phones will try again.

## 2018-10-17 ENCOUNTER — Encounter: Payer: Self-pay | Admitting: Cardiovascular Disease

## 2018-10-21 ENCOUNTER — Other Ambulatory Visit (INDEPENDENT_AMBULATORY_CARE_PROVIDER_SITE_OTHER): Payer: Medicare Other

## 2018-10-21 DIAGNOSIS — I10 Essential (primary) hypertension: Secondary | ICD-10-CM

## 2018-10-23 ENCOUNTER — Other Ambulatory Visit: Payer: Self-pay

## 2018-10-23 MED ORDER — FUROSEMIDE 20 MG PO TABS: 20.0000 mg | ORAL_TABLET | ORAL | 6 refills | Status: DC | PRN

## 2018-11-17 ENCOUNTER — Other Ambulatory Visit: Payer: Self-pay | Admitting: Cardiovascular Disease

## 2018-12-12 ENCOUNTER — Telehealth: Payer: Self-pay

## 2018-12-12 NOTE — Telephone Encounter (Signed)
Virtual Visit Pre-Appointment Phone Call  Steps For Call: [place] PHONE   1. Confirm consent - "In the setting of the current Covid19 crisis, you are scheduled for a (phone or video) visit with your provider on (date) at (time).  Just as we do with many in-office visits, in order for you to participate in this visit, we must obtain consent.  If you'd like, I can send this to your mychart (if signed up) or email for you to review.  Otherwise, I can obtain your verbal consent now.  All virtual visits are billed to your insurance company just like a normal visit would be.  By agreeing to a virtual visit, we'd like you to understand that the technology does not allow for your provider to perform an examination, and thus may limit your provider's ability to fully assess your condition.  Finally, though the technology is pretty good, we cannot assure that it will always work on either your or our end, and in the setting of a video visit, we may have to convert it to a phone-only visit.  In either situation, we cannot ensure that we have a secure connection.  Are you willing to proceed?"  2. Give patient instructions for WebEx download to smartphone as below if video visit  3. Advise patient to be prepared with any vital sign or heart rhythm information, their current medicines, and a piece of paper and pen handy for any instructions they may receive the day of their visit  4. Inform patient they will receive a phone call 15 minutes prior to their appointment time (may be from unknown caller ID) so they should be prepared to answer  5. Confirm that appointment type is correct in Epic appointment notes (video vs telephone)    TELEPHONE CALL NOTE  Bobby [name] been deemed a candidate for a follow-up tele-health visit to limit community exposure during the Covid-19 pandemic. I spoke with the patient via phone to ensure availability of phone/video source, confirm preferred email & phone  number, and discuss instructions and expectations.  I reminded Bobby [name] to be prepared with any vital sign and/or heart rhythm information that could potentially be obtained via home monitoring, at the time of his visit. I reminded Bobby [name] to expect a phone call at the time of his visit if his visit.  Did the patient verbally acknowledge consent to treatment?   Bobby [name], [place] 12/12/2018 2:28 PM   DOWNLOADING THE [place]  - If Apple, go to [employer] and type in WebEx in the search bar. [place] [employer], the blue/[place] circle. The app is free but as with any other app downloads, their phone may require them to verify saved payment information or Apple password. The patient does NOT have to create an account.  - If Android, ask patient to go to [employer] and type in WebEx in the search bar. [place] [employer], the blue/[place]aisy [place] circle. The app is free but as with any other app downloads, their phone may require them to verify saved payment information or Android password. The patient does NOT have to create an account.   CONSENT FOR TELE-HEALTH VISIT - PLEASE REVIEW  I hereby voluntarily request, consent and authorize [facility] and its employed or contracted physicians, physician assistants, nurse practitioners or other licensed health care professionals (the Practitioner), to provide me with telemedicine health care services (the "Services") as deemed  necessary by the treating Practitioner. I acknowledge and consent to receive the Services by the Practitioner via telemedicine. I understand that the telemedicine visit will involve communicating with the Practitioner through live audiovisual communication technology and the disclosure of certain medical information by electronic transmission. I acknowledge that I have been given the opportunity to request an in-person assessment or other available alternative prior to the  telemedicine visit and am voluntarily participating in the telemedicine visit.  I understand that I have the right to withhold or withdraw my consent to the use of telemedicine in the course of my care at any time, without affecting my right to future care or treatment, and that the Practitioner or I may terminate the telemedicine visit at any time. I understand that I have the right to inspect all information obtained and/or recorded in the course of the telemedicine visit and may receive copies of available information for a reasonable fee.  I understand that some of the potential risks of receiving the Services via telemedicine include:  [name] Delay or interruption in medical evaluation due to technological equipment failure or disruption; . Information transmitted may not be sufficient (e.g. poor resolution of images) to allow for appropriate medical decision making by the Practitioner; and/or  . In rare instances, security protocols could fail, causing a breach of personal health information.  Furthermore, I acknowledge that it is my responsibility to provide information about my medical history, conditions and care that is complete and accurate to the best of my ability. I acknowledge that Practitioner's advice, recommendations, and/or decision may be based on factors not within their control, such as incomplete or inaccurate data provided by me or distortions of diagnostic images or specimens that may result from electronic transmissions. I understand that the practice of medicine is not an exact science and that Practitioner makes no warranties or guarantees regarding treatment outcomes. I acknowledge that I will receive a copy of this consent concurrently upon execution via email to the email address I last provided but may also request a printed copy by calling the office of [place].    I understand that my insurance will be billed for this visit.   I have read or had this consent read to me.  . I understand the contents of this consent, which adequately explains the benefits and risks of the Services being provided via telemedicine.  . I have been provided ample opportunity to ask questions regarding this consent and the Services and have had my questions answered to my satisfaction. . I give my informed consent for the services to be provided through the use of telemedicine in my medical care  By participating in this telemedicine visit I agree to the above.

## 2018-12-15 ENCOUNTER — Other Ambulatory Visit: Payer: Self-pay | Admitting: Physician Assistant

## 2018-12-30 ENCOUNTER — Telehealth: Payer: Self-pay | Admitting: Physician Assistant

## 2018-12-30 NOTE — Telephone Encounter (Signed)
Call sister Vicki-[phone]/ my chart/ consent/ pre reg completed

## 2018-12-31 ENCOUNTER — Telehealth: Payer: Medicare Other | Admitting: Physician Assistant

## 2019-01-01 ENCOUNTER — Telehealth: Payer: Medicare Other | Admitting: Physician Assistant

## 2019-01-05 ENCOUNTER — Telehealth: Payer: Self-pay | Admitting: Cardiovascular Disease

## 2019-01-05 NOTE — Telephone Encounter (Signed)
New Message:  She asked for you, said she needs you to call her, concerning [place].

## 2019-01-07 ENCOUNTER — Other Ambulatory Visit: Payer: Self-pay | Admitting: Physician Assistant

## 2019-01-07 ENCOUNTER — Encounter: Payer: Medicare Other | Admitting: Physician Assistant

## 2019-01-08 ENCOUNTER — Other Ambulatory Visit: Payer: Self-pay | Admitting: Physician Assistant

## 2019-01-08 ENCOUNTER — Encounter: Payer: Self-pay | Admitting: Physician Assistant

## 2019-01-08 ENCOUNTER — Telehealth (INDEPENDENT_AMBULATORY_CARE_PROVIDER_SITE_OTHER): Payer: Medicare Other | Admitting: Physician Assistant

## 2019-01-08 VITALS — BP 166/86 | Wt 258.0 lb

## 2019-01-08 DIAGNOSIS — R0609 Other forms of dyspnea: Secondary | ICD-10-CM | POA: Diagnosis not present

## 2019-01-08 DIAGNOSIS — E785 Hyperlipidemia, unspecified: Secondary | ICD-10-CM

## 2019-01-08 DIAGNOSIS — I10 Essential (primary) hypertension: Secondary | ICD-10-CM

## 2019-01-08 DIAGNOSIS — I236 Thrombosis of atrium, auricular appendage, and ventricle as current complications following acute myocardial infarction: Secondary | ICD-10-CM

## 2019-01-08 DIAGNOSIS — Z8673 Personal history of transient ischemic attack (TIA), and cerebral infarction without residual deficits: Secondary | ICD-10-CM

## 2019-01-08 DIAGNOSIS — I255 Ischemic cardiomyopathy: Secondary | ICD-10-CM

## 2019-01-08 DIAGNOSIS — I2510 Atherosclerotic heart disease of native coronary artery without angina pectoris: Secondary | ICD-10-CM

## 2019-01-08 DIAGNOSIS — E119 Type 2 diabetes mellitus without complications: Secondary | ICD-10-CM

## 2019-01-08 MED ORDER — LOSARTAN POTASSIUM 25 MG PO TABS: 25.0000 mg | ORAL_TABLET | Freq: Every day | ORAL | 6 refills | Status: DC

## 2019-01-08 MED ORDER — FUROSEMIDE 20 MG PO TABS: 20.0000 mg | ORAL_TABLET | ORAL | 1 refills | Status: DC | PRN

## 2019-01-08 NOTE — Progress Notes (Signed)
Virtual Visit via Telephone Note   This visit type was conducted due to national recommendations for restrictions regarding the COVID-19 Pandemic (e.g. social distancing) in an effort to limit this patient's exposure and mitigate transmission in our community.  Due to his co-morbid illnesses, this patient is at least at moderate risk for complications without adequate follow up.  This format is felt to be most appropriate for this patient at this time.  The patient did not have access to video technology/had technical difficulties with video requiring transitioning to audio format only (telephone).  All issues noted in this document were discussed and addressed.  No physical exam could be performed with this format.  Please refer to the patient's chart for his  consent to telehealth for [facility].   Date:  01/10/2019   ID:  Bobby [name], DOB 01-29-1946, MRN [id number]  Patient Location: Home Provider Location: Home  PCP:  Hayden Rasmussen, MD  Cardiologist:  Shelva Majestic, MD  Electrophysiologist:  None   Evaluation Performed:  Follow-Up Visit  Chief Complaint:  followup  History of Present Illness:    Bobby [name] is a 73 y.o. male with PMH of DM II, HTN, HLD, pulmonary HTN, remote small TIA/CVA and CAD. He had inferior MI in December 2013 and underwent DES to RCA. Last Myoview obtained on 03/26/2014 was low risk showed normal LV function with normal wall motion. Fixed inferior defect most consistent with diaphragmatic attenuation, no evidence of ischemia. Abdominal ultrasound in 2017 showed normal abdominal aorta. He had a echocardiogram in October 2018 that showed EF 55 to 60%, mild LVH, grade 2 DD. He went to the hospital again on 08/09/2017 with anterior STEMI due to acute occlusion of mid LAD at second diagonal.  He underwent successful PCI of LAD with 2.5 x 22 mm Onyx DES.  He was also noted to have a 99% dominant mid left circumflex lesion with 80% stenosis in large OM1.  He  returned to the Cath Lab on 08/12/2017 and underwent DES to mid left circumflex and another DES to OM1.  Troponin peaked at greater than 65.  Echocardiogram obtained on 08/09/2017 showed EF 30-35%, new large aneurysm involving mid anterior septal, anterior and apical region consistent with infarct in LAD territory, Grade 1 DD.  Afterward, beta-blocker was held due to bradycardia.  He was readmitted to the hospital in December 2018 and underwent diuresis for heart failure and also treated for COPD exacerbation.  Last echocardiogram obtained on 01/20/2018 showed EF 35 to 40%, grade 1 DD, akinesis of the mid apical anteroseptal and apical myocardium.  There was evidence of swelling at the LV apex suggestive of low flow state.  He was initially started on Coumadin, then later transitioned to Eliquis.    During the last office visit with me in January 2020, his renal function has worsened, therefore I changed his Lasix to 20 mg every other day.  He was seen back by Dr. Claiborne Billings in February, Lasix was switched to as needed only.  Potassium supplement discontinued.  Patient was contacted today via telehealth visit.  This was a 3 way conference phone call with her sister also present.  She is involved in a lot of his medical care.  For the past 3 months, he has not used any Lasix.  He does describe dyspnea on exertion however no exertional chest discomfort.  He is receiving breathing treatment for wheezing.  This is being managed by his primary care provider.  His  weight is about 2 pounds heavier than his previous weight in February.  However he denies any obvious lower extremity edema, orthopnea or PND.  I have sending a prescription to local pharmacy for as needed dose of Lasix which he can take if lower extremity edema or breathing worsens.  He has not been taking his valsartan as he is now in pharmacy informed him there is a backorder on valsartan.  I will switch valsartan to losartan 25 mg daily.  For his dyspnea on  exertion, I recommend a CBC, basic metabolic panel and BNP.  Otherwise he can follow-up with Dr. Claiborne Billings in 2 to 68-month.   The patient does not have symptoms concerning for COVID-19 infection (fever, chills, cough, or new shortness of breath).    Past Medical History:  Diagnosis Date  . CAD (coronary artery disease),residual non obstructive disease    a. 08/2012 Inferior STEMI: RCA 99% (3.5x32 Promus DES), nonobs LAD/LCX dzs;  b. 01/2013 MV: EF 66%, small inferoseptal artifact and possible small inferobasilar scar;  c. 03/2014 MV: fixed inf defect->diaph attenuation, no ischemia. 12/18 PCI/DES to mLAD, staged PCI/DES Lcx/OM  . DM (diabetes mellitus), type 2 new diagnosis 08/05/2012   diet control  . ED (erectile dysfunction)    surgery planned  . History of echocardiogram    a. 08/2012 Echo: EF 55-60%, possible mild basal inferior HK, mildly dil RA, PASP 45mmHg.  [name] Hyperlipidemia 08/05/2012  . Hypertensive heart disease   . MI (myocardial infarction) ([place]) 08/04/2012   acute inferior stemi secondary to RCA occlusion; PCI  . Panic attack   . Pulmonary HTN ([place])    echo 08/05/12, EF 55-60%, PA pressure 10mm  . S/P coronary artery stent placement, to RCA Promus DES 08/05/2012  . Stroke [facility])    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  . Tobacco abuse    Past Surgical History:  Procedure Laterality Date  . CARDIAC CATHETERIZATION  08/04/12   PCI to RCA with DES  . CORONARY STENT INTERVENTION N/A 08/12/2017   Procedure: CORONARY STENT INTERVENTION;  Surgeon: Martinique, Peter M, MD;  Location: [facility] CV LAB;  Service: Cardiovascular;  Laterality: N/A;  . CORONARY/GRAFT ACUTE MI REVASCULARIZATION N/A 08/09/2017   Procedure: Coronary/Graft Acute MI Revascularization;  Surgeon: Belva Crome, MD;  Location: [place] CV LAB;  Service: Cardiovascular;  Laterality: N/A;  . LEFT HEART CATH AND CORONARY ANGIOGRAPHY N/A 08/09/2017   Procedure: LEFT HEART CATH AND CORONARY ANGIOGRAPHY;  Surgeon: Belva Crome,  MD;  Location: [place] CV LAB;  Service: Cardiovascular;  Laterality: N/A;  . LEFT HEART CATHETERIZATION WITH CORONARY ANGIOGRAM N/A 08/04/2012   Procedure: LEFT HEART CATHETERIZATION WITH CORONARY ANGIOGRAM;  Surgeon: Troy Sine, MD;  [facility] CATH LAB;  Service: Cardiovascular;  Laterality: N/A;  . LEG SURGERY Left    rod placed for fracture repair  . PENILE PROSTHESIS IMPLANT N/A 06/03/2014   Procedure: IMPLANT PENILE PROTHESIS INFLATABLE;  Surgeon: Ailene Rud, MD;  Location: [facility] ORS;  Service: Urology;  Laterality: N/A;  with penile block--0.5% marcaine plain  . PERCUTANEOUS CORONARY STENT INTERVENTION (PCI-S) Right 08/04/2012   Procedure: PERCUTANEOUS CORONARY STENT INTERVENTION (PCI-S);  Surgeon: Troy Sine, MD;  Location: [facility] CATH LAB;  Service: Cardiovascular;  Laterality: Right;  . THUMB ARTHROSCOPY Left    thumb joint replaced     Current Meds  Medication Sig  . apixaban (ELIQUIS) 5 MG TABS tablet Take 1 tablet (5 mg total) by mouth 2 (two) times daily.  [name]  atorvastatin (LIPITOR) 80 MG tablet Take 1 tablet (80 mg total) by mouth daily at 6 PM.  . buPROPion (WELLBUTRIN XL) 300 MG 24 hr tablet Take 300 mg by mouth daily.  . carvedilol (COREG) 12.5 MG tablet Take 1.5 tablets (18.75 mg total) by mouth 2 (two) times daily.  . clonazePAM (KLONOPIN) 0.5 MG tablet Take 1 mg by mouth 2 (two) times daily.   . clopidogrel (PLAVIX) 75 MG tablet Take 1 tablet (75 mg total) by mouth daily.  [name] COLCRYS 0.6 MG tablet Take 0.6 mg by mouth daily.  . metFORMIN (GLUCOPHAGE-XR) 500 MG 24 hr tablet Take 500 mg by mouth daily.  . Omega-3 Fatty Acids (FISH OIL PO) Take 1 tablet by mouth daily.  [name] oxyCODONE-acetaminophen (PERCOCET) 10-325 MG per tablet Take 1 tablet by mouth 3 (three) times daily.   . sertraline (ZOLOFT) 100 MG tablet Take 100 mg by mouth daily.     Allergies:   Hydrocodone   Social History   Tobacco Use  . Smoking status: Current Every Day Smoker    Packs/day:  1.00    Years: 56.00    Pack years: 56.00    Types: Cigarettes  . Smokeless tobacco: Never Used  . Tobacco comment: smoking now 1 ppd  Substance Use Topics  . Alcohol use: Yes    Comment: ocasionally  . Drug use: No     Family Hx: The patient's family history includes Heart attack in his paternal grandmother; Heart disease in his maternal grandmother; Kidney disease in his father; Stomach cancer in his maternal grandfather.  ROS:   Please see the history of present illness.     All other systems reviewed and are negative.   Prior CV studies:   The following studies were reviewed today:  Echo 01/20/2018 LV EF: 35% -   40%  Study Conclusions  - Left ventricle: The cavity size was normal. Wall thickness was   increased in a pattern of mild LVH. There was mild focal basal   hypertrophy of the septum. Systolic function was moderately   reduced. The estimated ejection fraction was in the range of 35%   to 40%. There is akinesis of the mid-apicalanteroseptal and   apical myocardium. Doppler parameters are consistent with   abnormal left ventricular relaxation (grade 1 diastolic   dysfunction).  Impressions:  - Akinesis of the distal septum and apex with overall moderate LV   dysfunction; mild diastolic dysfunction; possible small, layered   thrombus at LV apex noted using definity; also evidence of   swirling at LV apex suggestive of low flow state; consider   anticoagulation.  Labs/Other Tests and Data Reviewed:    EKG:  An ECG dated 10/15/2018 was personally reviewed today and demonstrated:  Normal sinus rhythm with right bundle branch block and T wave inversion in the lateral leads.  Recent Labs: 01/13/2018: ALT 30; BNP 65.7 02/21/2018: TSH 0.598 09/18/2018: Hemoglobin 13.1; Platelets 179 10/13/2018: BUN 37; Creatinine, Ser 2.07; Potassium 5.4; Sodium 139   Recent Lipid Panel Lab Results  Component Value Date/Time   CHOL 148 01/13/2018 08:39 AM   TRIG 265 (H)  01/13/2018 08:39 AM   HDL 36 (L) 01/13/2018 08:39 AM   CHOLHDL 4.1 01/13/2018 08:39 AM   CHOLHDL 4.8 08/09/2017 11:17 AM   LDLCALC 59 01/13/2018 08:39 AM    Wt Readings from Last 3 Encounters:  01/08/19 258 lb (117 kg)  10/15/18 256 lb 12.8 oz (116.5 kg)  10/01/18 252 lb (114.3 kg)  Objective:    Vital Signs:  BP (!) 166/86   Wt 258 lb (117 kg)   BMI 38.10 kg/m    VITAL SIGNS:  reviewed  ASSESSMENT & PLAN:    1. Dyspnea on exertion: Obtain standard lab work including CBC, TSH and basic metabolic panel, BMP  2. Uncontrolled hypertension: He has not been taking his valsartan as it is on back order, I will switch it to losartan.  3. CAD: Denies any exertional chest pain recently.  Continue on Plavix.  Not on aspirin given the need for Eliquis and Plavix  4. Ischemic cardiomyopathy/chronic systolic heart failure: Continue carvedilol, switch valsartan to losartan.  5. Hyperlipidemia: On Lipitor 80 mg daily  6. History of CVA: No recent recurrence.  7. DM 2: Managed by primary care provider  8. History of slow flow state in ventricle after MI: On Eliquis to prevent formation of LV thrombus  COVID-19 Education: The signs and symptoms of COVID-19 were discussed with the patient and how to seek care for testing (follow up with PCP or arrange E-visit).  The importance of social distancing was discussed today.  Time:   Today, I have spent 13 minutes with the patient with telehealth technology discussing the above problems.     Medication Adjustments/Labs and Tests Ordered: Current medicines are reviewed at length with the patient today.  Concerns regarding medicines are outlined above.   Tests Ordered: Orders Placed This Encounter  Procedures  . Basic metabolic panel  . CBC  . Pro b natriuretic peptide (BNP)    Medication Changes: Meds ordered this encounter  Medications  . losartan (COZAAR) 25 MG tablet    Sig: Take 1 tablet (25 mg total) by mouth daily.     Dispense:  30 tablet    Refill:  6  . DISCONTD: furosemide (LASIX) 20 MG tablet    Sig: Take 1 tablet (20 mg total) by mouth as needed for edema.    Dispense:  30 tablet    Refill:  1    DO NOT BUBBLE PACK    Disposition:  Follow up in 3 month(s)  Signed, [name], PA  01/10/2019 11:47 PM    [facility]

## 2019-01-08 NOTE — Telephone Encounter (Signed)
Furosemide refilled. 

## 2019-01-08 NOTE — Patient Instructions (Signed)
Medication Instructions:   STOP Valsartan START LOSARTAN 25 MG DAILY   If you need a refill on your cardiac medications before your next appointment, please call your pharmacy.   Lab work:  You will need to come into our office in 1 week to have labs (blood work) drawn:  BMET BNP CBC  If you have labs (blood work) drawn today and your tests are completely normal, you will receive your results only by: [name] MyChart Message (if you have MyChart) OR . A paper copy in the mail If you have any lab test that is abnormal or we need to change your treatment, we will call you to review the results.  Testing/Procedures:  NONE ordered at this time of appointment   Follow-Up: At [facility], you and your health needs are our priority.  As part of our continuing mission to provide you with exceptional heart care, we have created designated Provider Care Teams.  These Care Teams include your primary Cardiologist (physician) and Advanced Practice Providers (APPs -  Physician Assistants and Nurse Practitioners) who all work together to provide you with the care you need, when you need it. You will need a follow up appointment in 2-3 months.  Please call our office 2 months in advance to schedule this appointment.  You may see Shelva Majestic, MD or one of the following Advanced Practice Providers on your designated Care Team: [place], [place] . Fabian Sharp, PA-C  Any Other Special Instructions Will Be Listed Below (If Applicable).

## 2019-01-13 ENCOUNTER — Other Ambulatory Visit: Payer: Self-pay | Admitting: Cardiovascular Disease

## 2019-01-22 LAB — CBC
Hematocrit: 40.5 % (ref 37.5–51.0)
Hemoglobin: 14.1 g/dL (ref 13.0–17.7)
MCH: 29.6 pg (ref 26.6–33.0)
MCHC: 34.8 g/dL (ref 31.5–35.7)
MCV: 85 fL (ref 79–97)
Platelets: 182 10*3/uL (ref 150–450)
RBC: 4.76 x10E6/uL (ref 4.14–5.80)
RDW: 14.1 % (ref 11.6–15.4)
WBC: 8.1 10*3/uL (ref 3.4–10.8)

## 2019-01-22 LAB — BASIC METABOLIC PANEL
BUN/Creatinine Ratio: 14 (ref 10–24)
BUN: 26 mg/dL (ref 8–27)
CO2: 21 mmol/L (ref 20–29)
Calcium: 9.8 mg/dL (ref 8.6–10.2)
Chloride: 103 mmol/L (ref 96–106)
Creatinine, Ser: 1.81 mg/dL — ABNORMAL HIGH (ref 0.76–1.27)
GFR calc Af Amer: 42 mL/min/{1.73_m2} — ABNORMAL LOW (ref >59)
GFR calc non Af Amer: 36 mL/min/{1.73_m2} — ABNORMAL LOW (ref >59)
Glucose: 81 mg/dL (ref 65–99)
Potassium: 4.7 mmol/L (ref 3.5–5.2)
Sodium: 138 mmol/L (ref 134–144)

## 2019-01-22 LAB — PRO B NATRIURETIC PEPTIDE: NT-Pro BNP: 3753 pg/mL — ABNORMAL HIGH (ref 0–376)

## 2019-01-22 NOTE — Progress Notes (Signed)
The patient has been notified of the result and verbalized understanding.  All questions (if any) were answered. [name], [place] 01/22/2019 11:39 AM

## 2019-02-19 ENCOUNTER — Telehealth: Payer: Self-pay | Admitting: Cardiovascular Disease

## 2019-02-19 MED ORDER — CLOPIDOGREL BISULFATE 75 MG PO TABS: 75.0000 mg | ORAL_TABLET | Freq: Every day | ORAL | 3 refills | Status: DC

## 2019-02-19 MED ORDER — LOSARTAN POTASSIUM 25 MG PO TABS: 25.0000 mg | ORAL_TABLET | Freq: Every day | ORAL | 1 refills | Status: DC

## 2019-02-19 MED ORDER — APIXABAN 5 MG PO TABS: 5.0000 mg | ORAL_TABLET | Freq: Two times a day (BID) | ORAL | 1 refills | Status: DC

## 2019-02-19 MED ORDER — CARVEDILOL 12.5 MG PO TABS: 18.7500 mg | ORAL_TABLET | Freq: Two times a day (BID) | ORAL | 1 refills | Status: DC

## 2019-02-19 NOTE — Telephone Encounter (Signed)
  Patient calling the office for samples of medication:   1.  What medication and dosage are you requesting samples for? apixaban (ELIQUIS) 5 MG TABS tablet  2.  Are you currently out of this medication? Yes   See if we have samples for a couple of days or if a script for 2 days worth can be called in. He is waiting on his meds from [facility]

## 2019-02-19 NOTE — Telephone Encounter (Signed)
No Eliquis at this time  [facility], patient is completely out of medication. 2 weeks of Coreg, Eliquis, Losartan and Plavix sent to [facility]. Did confirm taking Losartan not Valsartan.

## 2019-03-05 ENCOUNTER — Telehealth: Payer: Self-pay | Admitting: Cardiovascular Disease

## 2019-03-05 NOTE — Telephone Encounter (Signed)
Spoke with pt sister,she called to report patients bp is running higher since the change of the medication. His valsartan was stopped and he is taking losartan 25 mg once daily. His bp has been ranging from 189-148/87. She reports he had trouble with his O2 tank and had some breathing issues but slept better last night after the oxygen was fixed. She was wondering if his medications needed to be changed. Will forward to dr Claiborne Billings to review and advise.

## 2019-03-05 NOTE — Telephone Encounter (Signed)
[phone]   Sister of patient called to report some bp and sob. You can call her cell phone at [phone]  Pt c/o BP issue: STAT if pt c/o blurred vision, one-sided weakness or slurred speech  1. What are your last 5 BP readings?  189/87 (others are listed at the patient's house)  2. Are you having any other symptoms (ex. Dizziness, headache, blurred vision, passed out)? no  3. What is your BP issue? Sister states that the top number seems a little higher than normal   Pt c/o Shortness Of Breath: STAT if SOB developed within the last 24 hours or pt is noticeably SOB on the phone  1. Are you currently SOB (can you hear that pt is SOB on the phone)? Sister is calling   2. How long have you been experiencing SOB? For a while now  3. Are you SOB when sitting or when up moving around? Both, more though when he gets up and moving   4. Are you currently experiencing any other symptoms? Pain in the lower back    Sister just called to report some things that she noticed yesterday when she was at the patient's house. Feel free to contact her at home or on her cell phone for clarification

## 2019-03-05 NOTE — Telephone Encounter (Signed)
Increase losartan to 50 mg daily but may need further titration depending upon blood pressure response

## 2019-03-09 MED ORDER — LOSARTAN POTASSIUM 25 MG PO TABS: 25.0000 mg | ORAL_TABLET | Freq: Every day | ORAL | 3 refills | Status: DC

## 2019-03-09 MED ORDER — LOSARTAN POTASSIUM 50 MG PO TABS: 50.0000 mg | ORAL_TABLET | Freq: Every day | ORAL | 3 refills | Status: DC

## 2019-03-09 NOTE — Telephone Encounter (Signed)
Spoke with pt sister, aware of dr [facility] recommendation. Script to [facility] for the 50 mg tablets and short term script sent to local pharmacy.

## 2019-03-12 ENCOUNTER — Other Ambulatory Visit: Payer: Self-pay | Admitting: Cardiovascular Disease

## 2019-03-13 ENCOUNTER — Other Ambulatory Visit: Payer: Self-pay | Admitting: Physician Assistant

## 2019-04-17 ENCOUNTER — Telehealth: Payer: Self-pay | Admitting: Cardiovascular Disease

## 2019-04-17 NOTE — Telephone Encounter (Signed)
Patient's sister called wanting to know if a list current medication sent to Dr. Horald Pollen, can be faxed to [phone].   Patient has appt today at 12:30am.

## 2019-04-17 NOTE — Telephone Encounter (Signed)
Sent patient call to medical records and staff message sent as well

## 2019-05-14 ENCOUNTER — Other Ambulatory Visit: Payer: Self-pay

## 2019-05-14 DIAGNOSIS — Z20822 Contact with and (suspected) exposure to covid-19: Secondary | ICD-10-CM

## 2019-05-15 LAB — NOVEL CORONAVIRUS, NAA: SARS-CoV-2, NAA: NOT DETECTED

## 2019-06-08 ENCOUNTER — Other Ambulatory Visit: Payer: Self-pay | Admitting: Physician Assistant

## 2019-07-21 ENCOUNTER — Telehealth: Payer: Self-pay | Admitting: Cardiovascular Disease

## 2019-07-21 NOTE — Telephone Encounter (Signed)
Advised sister ok to come with brother to appointment

## 2019-07-21 NOTE — Telephone Encounter (Signed)
New Message:     Pt's sister called and said she waill need to come in with pt for his appt on 07-24-19 with Dr Claiborne Billings. She said pt can not understand what he is tod and can not read.

## 2019-07-24 ENCOUNTER — Ambulatory Visit (INDEPENDENT_AMBULATORY_CARE_PROVIDER_SITE_OTHER): Payer: Medicare Other | Admitting: Cardiovascular Disease

## 2019-07-24 ENCOUNTER — Other Ambulatory Visit: Payer: Self-pay | Admitting: Cardiovascular Disease

## 2019-07-24 ENCOUNTER — Other Ambulatory Visit: Payer: Self-pay

## 2019-07-24 ENCOUNTER — Encounter: Payer: Self-pay | Admitting: Cardiovascular Disease

## 2019-07-24 VITALS — BP 165/89 | HR 76 | Ht 69.0 in | Wt 256.4 lb

## 2019-07-24 DIAGNOSIS — G4733 Obstructive sleep apnea (adult) (pediatric): Secondary | ICD-10-CM

## 2019-07-24 DIAGNOSIS — I10 Essential (primary) hypertension: Secondary | ICD-10-CM | POA: Diagnosis not present

## 2019-07-24 DIAGNOSIS — Z7901 Long term (current) use of anticoagulants: Secondary | ICD-10-CM

## 2019-07-24 DIAGNOSIS — I4510 Unspecified right bundle-branch block: Secondary | ICD-10-CM

## 2019-07-24 DIAGNOSIS — I255 Ischemic cardiomyopathy: Secondary | ICD-10-CM

## 2019-07-24 DIAGNOSIS — I2510 Atherosclerotic heart disease of native coronary artery without angina pectoris: Secondary | ICD-10-CM | POA: Diagnosis not present

## 2019-07-24 DIAGNOSIS — I5022 Chronic systolic (congestive) heart failure: Secondary | ICD-10-CM

## 2019-07-24 DIAGNOSIS — I513 Intracardiac thrombosis, not elsewhere classified: Secondary | ICD-10-CM

## 2019-07-24 DIAGNOSIS — N1832 Chronic kidney disease, stage 3b: Secondary | ICD-10-CM

## 2019-07-24 MED ORDER — CARVEDILOL 25 MG PO TABS: 25.0000 mg | ORAL_TABLET | Freq: Two times a day (BID) | ORAL | 1 refills | Status: DC

## 2019-07-24 MED ORDER — AMLODIPINE BESYLATE 5 MG PO TABS: 5.0000 mg | ORAL_TABLET | Freq: Every day | ORAL | 3 refills | Status: DC

## 2019-07-24 NOTE — Progress Notes (Signed)
Patient ID: Bobby [name], male   DOB: [birth year], 73 y.o.   MRN: [id number]    HPI: Bobby [name] is a 73 y.o. male presents to the office today for a 10 month followup evaluation.   Bobby [name]  has a long-standing tobacco history having started smoking at age 67, a history of hypertension, remote small TIA/CVA who presented to the hospital on 08/04/2012 in the setting of inferior ST segment elevation myocardial infarction. Catheterization by me revealed a 99% stenosis of the RCA and concomitant CAD involving his LAD and circumflex vessels. He underwent acute percutaneous coronary intervention with an excellent door to balloon time of only 20 minutes and ultimately insertion of a 3.25x32 mm DES stent post dilated 3.3. An echo done in the hospital showed an EF of 50-60% with mild basal inferior probable scar and moderate pulmonary hypertension with PA pressure of 45 mm.  When I saw Bobby [name] in May 2014, he did note shortness of breath with activity. He denied definitive chest pain. At that time, he realized that he had inadvertentlystopped taking the atorvastatin 40 mg dose and this was resumed. In addition, his Toprol dose was increased from 50 to 75 mg daily. A nuclear study showed an ejection fraction of 66%. Perfusion was essentially normal with exception of a small region of fixed inferoseptal bowel artifact and possible small area of inferobasilar scar.   Bobby [name] unfortunately continues to smoke at least a pack of cigarettes per day.   He does experience shortness of breath with activity.  He denies recent chest pain.  He has chronic right bundle branch block with repolarization changes.  He is diabetic and has pulmonary hypertension.  A follow-up nuclear perfusion study on 03/26/2014 remianed low risk and showed normal LV function and normal wall motion.  There was a fixed inferior defect that was felt most consistent with diaphragmatic attenuation.  There was no evidence for ischemia.  He  underwent prostate surgery by Dr. [name] without cardiovascular compromise.    A screening abdominal aortic ultrasound revealed a normal abdominal aorta.  Since I  saw him in November 2017, he has had an extensive history and has had recurrent hospitalizations he was hospitalized in October 2018 for left arm pain.  Troponin was negative.  In December 2018 he suffered an anterior ST segment elevation MI due to acute occlusion of his mid LAD and underwent successful PCI of his LAD with insertion of a 2.5 x 22 mm Resolute Onyx DES stent.  He was also found to have 99% dominant mid left circumflex lesion with 80% stenosis in the OM1 vessel and underwent staged intervention to the mid circumflex and marginal vessels.  Troponin was greater than 65.  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  There was mention of sludge is a sign of pre-thrombotic state but no definite thrombus was seen at the apex.  He returned to the hospital August 20, 2017 with malaise and dyspnea.  He was bradycardic.  He was diuresed.  He has renal insufficiency with creatinines increasing up to 1.9.  He has been followed by [name] on numerous occasions in the office.  Unfortunately continues to smoke cigarettes.  He admits to 1/2 pack/day.  He does note occasional dizziness as well as some shortness of breath.  He denies recurrent anginal type symptoms.   Suggestive of low flow state.  When I saw him  on Jan 07, 2018  I recommended that he undergo a follow-up echo Doppler study to reassess systolic and diastolic function as well as potential for apical thrombus formation.  He had continued to be on aspirin and Brilinta following his STEMI.  He was on atorvastatin 80 mg for hyperlipidemia with target less than 70.  I had a long discussion regarding smoking cessation.  He underwent a follow-up echo Doppler study on Jan 20, 2018.  This showed reduced LV function with  an EF of 35 to 40% with akinesis of the mid apical anterolateral and septal as well as apical walls.  There was grade 1 diastolic dysfunction.  There was now a possible small layered thrombus at the apex noted using Definity and also evidence of swirling at the LV apex suggestive of low flow state.   As result of the echo findings, I saw him in follow-up on Jan 24, 2018 at which time I had a very long discussion with him guarding potential thromboembolic stroke risk.  He was started on warfarin anticoagulation.  Apparently, the patient's medications have been in a  pack and had been followed by his girlfriend.  He was taking his medications correctly resulting in significant over anticoagulation with INR reached greater than 10.  He has been followed very closely by our pharmacy department and he also was given vitamin K.  Due to his poor medication compliance after much discussion ultimately the thought is for him to transition to Eliquis once his INR gets below 2.  He also has been taking his blood pressure medicines incorrectly.  Patient has been sleeping most of the day.  In the past I had recommended he undergo a sleep evaluation for sleep apnea which he never followed up with it was a no-show in the sleep lab.  He was worked into my schedule on February 21, 2018 and was seen as an add-on due to his medication issues.  He was here now with his sister who in the past had done an excellent job in caring for his medications and she is committed now to resume doing this for him.  During his evaluation, he was exceptionally somnolent, and a significant time was spent with medication adjustment.  Due to his inability to take effect of warfarin the decision was made to use Eliquis for anticoagulation with more consistent dosing.  His recent laboratory had shown stage IV chronic kidney disease and it was recommended that he hold his lisinopril for several days and then reduce the dose to 5 mg as well as holding his  furosemide with subsequent reduction of dose with reinstitution at 20 mg.  I scheduled him for an expeditious sleep study due to concerns for significant sleep apnea and he was seen5 days later by Racquel with medication adjustment and anticoagulation issues.  He underwent a sleep study on February 25, 2018 and he was found to have moderate overall sleep apnea with an AHI of 22.2 and RDI of 25.4.  He could not achieve any rem sleep and the overall severity may very well be underestimated.  He was titrated up to 10 cm water pressure.  An initial trial of CPAP auto with an EPR range of 8-15 was recommended.  When I last saw him on March 19, 2018 he had he had just initiated CPAP the evening before noted significant improvement in his sleep. His renal function had significantly improved from a creatinine of 2.7 down to 1.362 weeks ago.  He is unaware  of any recurrent episodes of arrhythmia.  Apparently his blood pressure had become elevated and Dr. Darron Doom added losartan to his medical regimen.  Of note, he also already was on lisinopril 2.5 mg, furosemide 20 mg, carvedilol 6.25 mg twice a day.  He has been only intermittently using CPAP due to complaints of the mask on his nose bridge.  As result, he was recently changed to a different mask.  A download was obtained in the office today in which he does not meet compliance.  [employer] is his DME company.  His set up date was March 17, 2018 and he has until June 17, 2018 to demonstrate compliance.    When I  saw him in September 2019 his blood pressure was elevated.  At that time apparently he was on both the previous lisinopril and the recent losartan that was started by Dr. Darron Doom.  I suggested he discontinue lisinopril and further titrated losartan to 25 mg twice a day both for improved blood pressure and for his reduced LV function.  He was not compliant with reference to his CPAP therapy and I had a long discussion with him regarding the importance that he meet  compliance by October 15.  I saw him in October 2019.  At that time he was not using his CPAP therapy.  I reviewed his blood pressure recordings and they seem to be consistently elevated.  He continues to be on Eliquis and was taking Lasix 20 mg.  That evaluation I had a long discussion with him guarding his noncompliance with CPAP.  I discussed alternatives such as customized oral appliance.  We discussed with his LV dysfunction he may be a candidate for Jardiance if his renal function is stable.  Was maintaining sinus rhythm.  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  His ECG was unchanged.  Troponins were negative.  He was discharged from the ER and seen by [name][facility] on October 01, 2018.  He had not had any further chest pain since he left the hospital and no ischemic work-up was planned.  His renal function had worsened and his furosemide was changed to every other day.  Visibly he feels well and has not had recurrent chest pain.  He uses oxygen at 2 L nasal cannula at nighttime instead of his CPAP therapy.  Laboratory 2 days prior to his office visit has shown a creatinine of 2.07 with a BUN of 37.  Potassium was 5.4.   I last saw him in February 2020.  At that time his blood pressure was stable and he was taken furosemide 20 mg every other day, carvedilol 18.75 mg twice a day, and valsartan 40 mg.  Renal function improved with discontinuance of furosemide.  He comes to the office today with his sister.  He apparently is on supplemental oxygen at nighttime but has not been using CPAP.  Upon further questioning he had difficulty with the mask on the bridge of his nose.  Recently, his blood pressure has been elevated and he has been of seen by Dr. Darron Doom.  He apparently is now on losartan 50 mg in place of valsartan, carvedilol 18.75 mg twice a day, and he is on  Plavix in addition to apixaban 5 mg twice a day.  He continues me on atorvastatin 80 mg daily for hyperlipidemia.  He  is diabetic on metformin.  He is on bupropion 300 mg for depression.  He presents for evaluation.  Past Medical History:  Diagnosis Date   CAD (coronary artery disease),residual non obstructive disease    a. 08/2012 Inferior STEMI: RCA 99% (3.5x32 Promus DES), nonobs LAD/LCX dzs;  b. 01/2013 MV: EF 66%, small inferoseptal artifact and possible small inferobasilar scar;  c. 03/2014 MV: fixed inf defect->diaph attenuation, no ischemia. 12/18 PCI/DES to mLAD, staged PCI/DES Lcx/OM   DM (diabetes mellitus), type 2 new diagnosis 08/05/2012   diet control   ED (erectile dysfunction)    surgery planned   History of echocardiogram    a. 08/2012 Echo: EF 55-60%, possible mild basal inferior HK, mildly dil RA, PASP 64mHg.   Hyperlipidemia 08/05/2012   Hypertensive heart disease    [place]ocardial infarction[place] [place]   acute inferior stemi secondary to RCA occlusion; PCI   Panic attack    [place]    echo 08/05/12, EF 55-60%, PA pressure 42m  S/P coronary artery stent placement, to RCA Promus DES 08/05/2012   [facility]   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   Tobacco abuse     Past Surgical History:  Procedure Laterality Date   CARDIAC CATHETERIZATION  08/04/12   PCI to RCA with DES   CORONARY STENT INTERVENTION N/A 08/12/2017   Procedure: CORONARY STENT INTERVENTION;  Surgeon: JoMartiniquePeter M, MD;  [facility];  Service: Cardiovascular;  Laterality: N/A;   CORONARY/GRAFT ACUTE MI REVASCULARIZATION N/A 08/09/2017   Procedure: Coronary/Graft Acute MI Revascularization;  Surgeon: SmBelva CromeMD;  [facility];  Service: Cardiovascular;  Laterality: N/A;   LEFT HEART CATH AND CORONARY ANGIOGRAPHY N/A 08/09/2017   Procedure: LEFT HEART CATH AND CORONARY ANGIOGRAPHY;  Surgeon: SmBelva CromeMD;  [facility];  Service: Cardiovascular;  Laterality: N/A;   LEFT HEART CATHETERIZATION WITH CORONARY ANGIOGRAM N/A 08/04/2012   Procedure: LEFT HEART CATHETERIZATION WITH CORONARY ANGIOGRAM;  Surgeon: ThTroy SineMD;  [facility];  Service: Cardiovascular;  Laterality: N/A;   LEG SURGERY Left    rod placed for fracture repair   PENILE PROSTHESIS IMPLANT N/A 06/03/2014   Procedure: IMPLANT PENILE PROTHESIS INFLATABLE;  Surgeon: SiAilene RudMD;  Location: [facility];  Service: Urology;  Laterality: N/A;  with penile block--0.5% marcaine plain   PERCUTANEOUS CORONARY STENT INTERVENTION (PCI-S) Right 08/04/2012   Procedure: PERCUTANEOUS CORONARY STENT INTERVENTION (PCI-S);  Surgeon: ThTroy SineMD;  [facility];  Service: Cardiovascular;  Laterality: Right;   THUMB ARTHROSCOPY Left    thumb joint replaced    Allergies  Allergen Reactions   Hydrocodone Itching    Current Outpatient Medications  Medication Sig Dispense Refill   albuterol (PROVENTIL HFA;VENTOLIN HFA) 108 (90 Base) MCG/ACT inhaler Inhale 2 puffs into the lungs every 6 (six) hours as needed for wheezing or shortness of breath. (Patient not taking: Reported on 01/08/2019) 1 Inhaler 2   albuterol (PROVENTIL) (2.5 MG/3ML) 0.083% nebulizer solution Take 5 mg by nebulization every 4 (four) hours as needed. For wheezing or SOB     amLODipine (NORVASC) 5 MG tablet Take 1 tablet (5 mg total) by mouth daily. 180 tablet 3   apixaban (ELIQUIS) 5 MG TABS tablet Take 1 tablet (5 mg total) by mouth 2 (two) times daily. 28 tablet 1   atorvastatin (LIPITOR) 80 MG tablet Take 1 tablet (80 mg total) by mouth daily  at 6 PM. 90 tablet 3   buPROPion (WELLBUTRIN XL) 300 MG 24 hr tablet Take 300 mg by mouth daily.     carvedilol (COREG) 25 MG tablet TAKE 1 TABLET(25 MG) BY MOUTH TWICE DAILY 180 tablet 0   clonazePAM (KLONOPIN) 0.5 MG tablet Take 1 mg by mouth 2 (two) times daily.   0   clopidogrel (PLAVIX) 75 MG tablet  TAKE ONE TABLET BY MOUTH DAILY 90 tablet 2   COLCRYS 0.6 MG tablet Take 0.6 mg by mouth daily.  3   furosemide (LASIX) 20 MG tablet TAKE 1 TABLET BY MOUTH AS NEEDED FOR EDEMA 90 tablet 1   losartan (COZAAR) 50 MG tablet Take 1 tablet (50 mg total) by mouth daily. 90 tablet 3   metFORMIN (GLUCOPHAGE-XR) 500 MG 24 hr tablet Take 500 mg by mouth daily.  3   nitroGLYCERIN (NITROSTAT) 0.4 MG SL tablet DISSOLVE ONE TABLET UNDER THE TONGUE every FIVE MINUTES AS NEEDED FOR CHEST PAIN, DO not exceed THREE doses IN 15 MINUTES 25 tablet 2   Omega-3 Fatty Acids (FISH OIL PO) Take 1 tablet by mouth daily.     oxyCODONE-acetaminophen (PERCOCET) 10-325 MG per tablet Take 1 tablet by mouth 3 (three) times daily.      sertraline (ZOLOFT) 100 MG tablet Take 100 mg by mouth daily.     Tiotropium Bromide-Olodaterol (STIOLTO RESPIMAT) 2.5-2.5 MCG/ACT AERS Inhale 2 puffs into the lungs daily.     No current facility-administered medications for this visit.     Socially,  he is divorced. He doesn't walk but not routinely exercise.  He continues to smoke one pack of cigarettes per day.  ROS General: Negative; No fevers, chills, or night sweats;  HEENT: Negative; No changes in vision or hearing, sinus congestion, difficulty swallowing Pulmonary:  Positive for shortness of breath; No cough, wheezing, , hemoptysis Cardiovascular: See history of present illness GI: Negative; No nausea, vomiting, diarrhea, or abdominal pain GU: Negative; No dysuria, hematuria, or difficulty voiding Musculoskeletal: Negative; no myalgias, joint pain, or weakness Hematologic/Oncology: Negative; no easy bruising, bleeding Endocrine: Positive for diabetes no heat/cold intolerance;  Neuro: Negative; no changes in balance, headaches Skin: Negative; No rashes or skin lesions Psychiatric: Negative; No behavioral problems, depression Sleep: As it of her obstructive sleep apnea, CPAP initiated March 17, 2018. no bruxism, restless  legs, hypnogognic hallucinations, no cataplexy Other comprehensive 14 point system review is negative.   PE BP (!) 165/89 (BP Location: Left Arm, Patient Position: Sitting, Cuff Size: Large)    Pulse 76    Ht _0  (1.753 m)    Wt 256 lb 6.4 oz (116.3 kg)    BMI 37.86 kg/m    Repeat blood pressure by me was elevated at 162/90  Wt Readings from Last 3 Encounters:  07/24/19 256 lb 6.4 oz (116.3 kg)  01/08/19 258 lb (117 kg)  10/15/18 256 lb 12.8 oz (116.5 kg)   General: Alert, oriented, no distress.  Skin: normal turgor, no rashes, warm and dry HEENT: Normocephalic, atraumatic. Pupils equal round and reactive to light; sclera anicteric; extraocular muscles intact;  Nose without nasal septal hypertrophy Mouth/Parynx benign; Mallinpatti scale 3/4 Neck: No JVD, no carotid bruits; normal carotid upstroke Lungs: clear to ausculatation and percussion; no wheezing or rales Chest wall: without tenderness to palpitation Heart: PMI not displaced, RRR, s1 s2 normal, 1/6 systolic murmur, no diastolic murmur, no rubs, gallops, thrills, or heaves Abdomen: central adiposity; soft, nontender; no hepatosplenomehaly, BS+; abdominal aorta nontender and  not dilated by palpation. Back: no CVA tenderness Pulses 2+ Musculoskeletal: full range of motion, normal strength, no joint deformities Extremities: no clubbing cyanosis or edema, Homan's sign negative  Neurologic: grossly nonfocal; Cranial nerves grossly wnl Psychologic: Normal mood and affect   ECG (independently read by me): Normal sinus rhythm at 80 bpm.  Right bundle branch block; previously noted Q waves V1 through V4 and 2 3 and F consistent with prior infarct  October 15, 2018 ECG (independently read by me): Normal sinus rhythm at 93 bpm with baseline artifact.  Right bundle branch block.  Anterior Q waves consistent with prior infarct.  Inferior Q waves.  ECG (independently read by me): Sinus rhythm at 65 bpm.  PAC.  Right bundle branch  block, inferior Q waves in 3 and aVF, old anterior myocardial infarction.  Lateral T wave abnormality.  Normal intervals.  June 16, 2018 ECG (independently read by me): Normal sinus rhythm at 84 bpm.  Right bundle branch block with repolarization changes.  Inferior Q waves consistent with prior infarct.  Anteroseptal Q waves consistent with prior anterior MI.  May 28, 2018 ECG (independently read by me): Normal sinus rhythm with sinus arrhythmia.  Right bundle branch block with repolarization changes.  Q waves consistent with anteroseptal MI and possible inferior MI no indication to adjust the pain upon his needs  July 17,2019 ECG (independently read by me): Normal sinus rhythm 89 bpm.  Right bundle branch block with repolarization changes.  Inferior Q waves and anterior  Q waves consistent with anterolateral infarct with possible inferior infarct  February 21, 2018 ECG (independently read by me): Normal sinus rhythm at 95 bpm, right bundle branch block with repolarization changes.  Anterolateral MI.  QTc interval 477 ms  Jan 24, 2018 ECG (independently read by me): Sinus rhythm at 97 bpm.  Right bundle branch block with repolarization changes.  QTc interval 472 ms.  Anterolateral Q waves  Jan 07, 2018 ECG (independently read by me): Normal sinus rhythm at 88 bpm.  Right bundle branch block with repolarization changes.  Inferior Q waves.  T wave anteroseptally, possible anteroseptal MI undetermined.  November 2017 ECG (independently read by me): Normal sinus rhythm at 64 bpm.  Branch block with repolarization changes.  QTc interval 460 ms.  June 2016 ECG (independently read by me): Sinus bradycardia at 57 bpm, right bundle branch block with repositioning changes.  QTc interval 459 ms.  September 2015 ECG (independently read by me and (: Normal sinus rhythm.  Right bundle branch block with repolarization changes.  Ventricular rate 81.  02/25/2014 ECG Normal sinus rhythm.  Right bundle branch  block with repolarization changes.  PR interval 148 ms; QTc interval 463 ms  PriorECG: sinus rhythm at 76 beats per minute; right bundle-branch block with repolarization changes. PR interval 142 ms, QTC 481 ms.   LABS:  BMP Latest Ref Rng & Units 01/21/2019 10/13/2018 09/18/2018  Glucose 65 - 99 mg/dL 81 97 149(H)  BUN 8 - 27 mg/dL 26 37(H) 29(H)  Creatinine 0.76 - 1.27 mg/dL 1.81(H) 2.07(H) 2.10(H)  BUN/Creat Ratio 10 - _0 -  Sodium 134 - 144 mmol/L 138 139 140  Potassium 3.5 - 5.2 mmol/L 4.7 5.4(H) 4.5  Chloride 96 - 106 mmol/L 103 104 107  CO2 20 - 29 mmol/L 21 19(L) 23  Calcium 8.6 - 10.2 mg/dL 9.8 9.7 9.7   Hepatic Function Latest Ref Rng & Units 01/13/2018 08/20/2017 08/10/2017  Total Protein 6.0 - 8.5 g/dL  6.6 6.9 6.1(L)  Albumin 3.5 - 4.8 g/dL 4.2 3.7 3.4(L)  AST 0 - 40 IU/L 22 26 206(H)  ALT 0 - 44 IU/L 30 44 53  Alk Phosphatase 39 - 117 IU/L 129(H) 106 80  Total Bilirubin 0.0 - 1.2 mg/dL <0.2 0.4 0.5   CBC Latest Ref Rng & Units 01/21/2019 09/18/2018 02/19/2018  WBC 3.4 - 10.8 x10E3/uL 8.1 8.7 8.5  Hemoglobin 13.0 - 17.7 g/dL 14.1 13.1 12.5(L)  Hematocrit 37.5 - 51.0 % 40.5 42.1 37.2(L)  Platelets 150 - 450 x10E3/uL 182 179 186   Lab Results  Component Value Date   MCV 85 01/21/2019   MCV 89.8 09/18/2018   MCV 88 02/19/2018   Lab Results  Component Value Date   TSH 0.598 02/21/2018   Lipid Panel     Component Value Date/Time   CHOL 148 01/13/2018 0839   TRIG 265 (H) 01/13/2018 0839   HDL 36 (L) 01/13/2018 0839   CHOLHDL 4.1 01/13/2018 0839   CHOLHDL 4.8 08/09/2017 1117   VLDL 52 (H) 08/09/2017 1117   LDLCALC 59 01/13/2018 0839   INR result since initiation of Coumadin: 1:> 7.6;> >10;> 8.6 (Vit K administered) >4.3; no longer on Coumadin, now on Eliquis  RADIOLOGY: No results found.  IMPRESSION: 1. Essential hypertension   2. Coronary artery disease involving native coronary artery of native heart without angina pectoris   3. Ischemic cardiomyopathy    4. Chronic systolic heart failure ([place])   5. OSA (obstructive sleep apnea)   6. Apical mural thrombus   7. Long term (current) use of anticoagulants   8. Stage 3b chronic kidney disease   9. RBBB     ASSESSMENT AND PLAN:  Bobby [name] is a 73 year old Caucasian male who suffered an acute coronary syndrome on 08/04/2012 when he presented with subtotal occlusion of his RCA. He had diffuse disease beyond the subtotal occlusion and ultimately had successful insertion of a 3.25x32 mm Promus DES stent. He had concomitant CAD with 40-50% proximal LAD narrowing, 30% circumflex marginal stenoses. NMR lipoprofile after initiation of atorvastatin in January 2014 showed marked improvement in total cholesterol of 119 LDL 48 LDL particle #992. HDL was very low at 30 with an increased triglyceride at 203 and significantly reduced HDL particle #24.2. His insulin resistance score was elevated at 65.   He  suffered an anterior STEMI in December 2018 and underwent successful stenting and several days later he required staged intervention to circumflex and marginal vessel. 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  Warfarin was initially prescribed but due to significant issues with compliance and overmedication the decision was made that he would be safer using Eliquis even though he is on this for apical thrombus rather than atrial fibrillation.   He was subsequently found to have significant obstructive sleep apnea but stopped therapy.  He is now using nocturnal oxygen.  He had developed somewhat atypical chest pain and no subsequent ischemic evaluation was initiated.  Since I last saw him, his valsartan was changed to losartan initially at 25 mg and further titrated by Dr. Darron Doom to 50 mg.  Blood pressure today is 162/90 repeat her necessity increase carvedilol to 25 mg twice a day and I am adding amlodipine 5 mg daily.  He will continue with his present dose of losartan.  I again  discussed his significant sleep apnea and the changes in mask technology which he may be able to tolerate.  He will contemplate whether or not to reinstitute therapy but I believe he would do well with a ResMed air fit F 30i mask.  He will let us know if he wishes to reconsider CPAP use and this will be ordered.  According to his sister he still has his machine.  He continues to be on Eliquis 5 mg and is not having any bleeding.  He is diabetic on metformin.  He has stage III chronic kidney disease within with creatinine improving from 2.07 to 1.77.  He tells me he will be having laboratory drawn today by Dr. Darron Doom.  I will asked that these be sent to me for my review.  I will see him in 4 months for cardiology reevaluation  Time spent: 25 minutes  Troy Sine, MD, [facility]  07/25/2019 11:56 AM

## 2019-07-24 NOTE — Patient Instructions (Signed)
Medication Instructions:  INCREASE CARVEDILOL 25MG  TWICE DAILY  START AMLODIPINE 5MG  DAILY  If you need a refill on your cardiac medications before your next appointment, please call your pharmacy.  Labwork: HAVE DR RICHTER'S OFFICE FAX LAB RESULTS TO DR Claiborne Billings [phone] If you have labs (blood work) drawn today and your tests are completely normal, you will receive your results only by: [name] MyChart Message (if you have MyChart) OR . A paper copy in the mail If you have any lab test that is abnormal or we need to change your treatment, we will call you to review the results.  Follow-Up: IN 4 months Please call our office 2 months in advance, JAN 2021 to schedule this [facility] 2021 appointment. In Person Shelva Majestic, MD.    At [facility], you and your health needs are our priority.  As part of our continuing mission to provide you with exceptional heart care, we have created designated Provider Care Teams.  These Care Teams include your primary Cardiologist (physician) and Advanced Practice Providers (APPs -  Physician Assistants and Nurse Practitioners) who all work together to provide you with the care you need, when you need it.  Thank you for choosing [facility] at [facility]!!

## 2019-07-25 ENCOUNTER — Encounter: Payer: Self-pay | Admitting: Cardiovascular Disease

## 2019-08-06 ENCOUNTER — Other Ambulatory Visit: Payer: Self-pay | Admitting: Cardiovascular Disease

## 2019-08-06 MED ORDER — CARVEDILOL 25 MG PO TABS: ORAL_TABLET | ORAL | 3 refills | Status: DC

## 2019-08-06 NOTE — Telephone Encounter (Signed)
Pt's medication was sent to the wrong pharmacy. Would like for it to be sent to [facility]. Please address

## 2019-08-24 ENCOUNTER — Other Ambulatory Visit: Payer: Self-pay | Admitting: Cardiovascular Disease

## 2019-09-10 ENCOUNTER — Other Ambulatory Visit: Payer: Self-pay | Admitting: Physician Assistant

## 2019-11-05 ENCOUNTER — Encounter (HOSPITAL_COMMUNITY): Payer: Self-pay | Admitting: *Deleted

## 2019-11-05 ENCOUNTER — Other Ambulatory Visit: Payer: Self-pay

## 2019-11-05 ENCOUNTER — Emergency Department (HOSPITAL_COMMUNITY): Payer: Medicare Other

## 2019-11-05 ENCOUNTER — Emergency Department (HOSPITAL_COMMUNITY)
Admission: EM | Admit: 2019-11-05 | Discharge: 2019-11-05 | Disposition: A | Discharge status: Home / Self Care | Payer: Medicare Other | Attending: Emergency Medicine | Admitting: Emergency Medicine

## 2019-11-05 DIAGNOSIS — Z20822 Contact with and (suspected) exposure to covid-19: Secondary | ICD-10-CM | POA: Insufficient documentation

## 2019-11-05 DIAGNOSIS — Z7901 Long term (current) use of anticoagulants: Secondary | ICD-10-CM | POA: Diagnosis not present

## 2019-11-05 DIAGNOSIS — I2510 Atherosclerotic heart disease of native coronary artery without angina pectoris: Secondary | ICD-10-CM | POA: Diagnosis not present

## 2019-11-05 DIAGNOSIS — F17210 Nicotine dependence, cigarettes, uncomplicated: Secondary | ICD-10-CM | POA: Diagnosis not present

## 2019-11-05 DIAGNOSIS — E119 Type 2 diabetes mellitus without complications: Secondary | ICD-10-CM | POA: Insufficient documentation

## 2019-11-05 DIAGNOSIS — I110 Hypertensive heart disease with heart failure: Secondary | ICD-10-CM | POA: Insufficient documentation

## 2019-11-05 DIAGNOSIS — Z7984 Long term (current) use of oral hypoglycemic drugs: Secondary | ICD-10-CM | POA: Diagnosis not present

## 2019-11-05 DIAGNOSIS — J441 Chronic obstructive pulmonary disease with (acute) exacerbation: Secondary | ICD-10-CM | POA: Diagnosis not present

## 2019-11-05 DIAGNOSIS — Z79899 Other long term (current) drug therapy: Secondary | ICD-10-CM | POA: Insufficient documentation

## 2019-11-05 DIAGNOSIS — I5022 Chronic systolic (congestive) heart failure: Secondary | ICD-10-CM | POA: Diagnosis not present

## 2019-11-05 DIAGNOSIS — R0602 Shortness of breath: Secondary | ICD-10-CM | POA: Diagnosis present

## 2019-11-05 LAB — CBC WITH DIFFERENTIAL/PLATELET
Abs Immature Granulocytes: 0.03 10*3/uL (ref 0.00–0.07)
Basophils Absolute: 0.1 10*3/uL (ref 0.0–0.1)
Basophils Relative: 1 %
Eosinophils Absolute: 0.5 10*3/uL (ref 0.0–0.5)
Eosinophils Relative: 6 %
HCT: 44.3 % (ref 39.0–52.0)
Hemoglobin: 13.9 g/dL (ref 13.0–17.0)
Immature Granulocytes: 0 %
Lymphocytes Relative: 13 %
Lymphs Abs: 1 10*3/uL (ref 0.7–4.0)
MCH: 29.6 pg (ref 26.0–34.0)
MCHC: 31.4 g/dL (ref 30.0–36.0)
MCV: 94.3 fL (ref 80.0–100.0)
Monocytes Absolute: 0.2 10*3/uL (ref 0.1–1.0)
Monocytes Relative: 3 %
Neutro Abs: 5.5 10*3/uL (ref 1.7–7.7)
Neutrophils Relative %: 77 %
Platelets: 156 10*3/uL (ref 150–400)
RBC: 4.7 MIL/uL (ref 4.22–5.81)
RDW: 13.4 % (ref 11.5–15.5)
WBC: 7.2 10*3/uL (ref 4.0–10.5)
nRBC: 0 % (ref 0.0–0.2)

## 2019-11-05 LAB — COMPREHENSIVE METABOLIC PANEL
ALT: 22 U/L (ref 0–44)
AST: 17 U/L (ref 15–41)
Albumin: 3.9 g/dL (ref 3.5–5.0)
Alkaline Phosphatase: 94 U/L (ref 38–126)
Anion gap: 9 (ref 5–15)
BUN: 24 mg/dL — ABNORMAL HIGH (ref 8–23)
CO2: 24 mmol/L (ref 22–32)
Calcium: 9.3 mg/dL (ref 8.9–10.3)
Chloride: 105 mmol/L (ref 98–111)
Creatinine, Ser: 2.25 mg/dL — ABNORMAL HIGH (ref 0.61–1.24)
GFR calc Af Amer: 32 mL/min — ABNORMAL LOW (ref >60)
GFR calc non Af Amer: 28 mL/min — ABNORMAL LOW (ref >60)
Glucose, Bld: 104 mg/dL — ABNORMAL HIGH (ref 70–99)
Potassium: 4.8 mmol/L (ref 3.5–5.1)
Sodium: 138 mmol/L (ref 135–145)
Total Bilirubin: 0.7 mg/dL (ref 0.3–1.2)
Total Protein: 6.5 g/dL (ref 6.5–8.1)

## 2019-11-05 LAB — BRAIN NATRIURETIC PEPTIDE: B Natriuretic Peptide: 261 pg/mL — ABNORMAL HIGH (ref 0.0–100.0)

## 2019-11-05 LAB — POC SARS CORONAVIRUS 2 AG -  ED: SARS Coronavirus 2 Ag: NEGATIVE

## 2019-11-05 LAB — TROPONIN I (HIGH SENSITIVITY)
Troponin I (High Sensitivity): 16 ng/L (ref <18)
Troponin I (High Sensitivity): 17 ng/L (ref <18)

## 2019-11-05 MED ORDER — PREDNISONE 50 MG PO TABS: 50.0000 mg | ORAL_TABLET | Freq: Every day | ORAL | 0 refills | Status: AC

## 2019-11-05 NOTE — ED Provider Notes (Signed)
3:28 PM Care assumed from Dr. Alvino Chapel.  At time of transfer care, patient is awaiting reassessment after delta troponin and BNP to look for potential cardiac or CHF causes of shortness of breath.  If labs are reassuring and patient is able to ambulate without hypoxia, anticipate discharge home.  A Covid test is also in process.  Patient's Covid test was negative.  Troponin negative x2.  BNP improved from prior.  Patient was able to ambulate and did not have hypoxia.  He feels better and would like to go home.  As this is likely COPD exacerbation, will give the patient a burst of steroids for the next few days as he is already feeling better after the steroids today.  Patient will follow up with his PCP and understood return precautions.  Had no other questions or concerns and was discharged in good condition.    Clinical Impression: 1. COPD exacerbation ([place])     Disposition: Discharge  Condition: Good  I have discussed the results, Dx and Tx plan with the pt(& family if present). He/she/they expressed understanding and agree(s) with the plan. Discharge instructions discussed at great length. Strict return precautions discussed and pt &/or family have verbalized understanding of the instructions. No further questions at time of discharge.    New Prescriptions   PREDNISONE (DELTASONE) 50 MG TABLET    Take 1 tablet (50 mg total) by mouth daily for 4 days.    Follow Up: Hayden Rasmussen, MD [place] [address] [phone]        Doris Mcgilvery, Gwenyth Allegra, MD 11/05/19 2027

## 2019-11-05 NOTE — Discharge Instructions (Signed)
Your work-up today was overall reassuring.  Your heart enzymes were negative.  Recheck in.  BNP which look for fluid overload in the setting of CHF was improved.  Your chest x-ray did not show convincing evidence of pneumonia.  As your oxygen has improved and you are feeling better, we feel you are safe for discharge home.  Please use the steroids for the next 2 days starting tomorrow and follow-up with your primary doctor in several days.  If any symptoms change or worsen, please return to nearest emergency department.

## 2019-11-05 NOTE — ED Notes (Signed)
Pt ambulatory to the restroom without difficulty. Gait steady and even. Pt oxygen saturations remained 94% or greater. Pt in NAD.

## 2019-11-05 NOTE — ED Triage Notes (Signed)
Patient presents to ed via [facility]  From his PCPstates he has been having sob x 2 weeks. Worse with excursion  States he is compliant with all medications however he isn't compliant with his cpap at  Night. C/o productive cough at times with white colored sputum. Denies chest pain or pedal edema. Patient was given solu-medrol 125 mg IM and albuterol 2.5/atrovent.5 at the office. Patient is able to speak in complete sentences.
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
## 2019-11-10 ENCOUNTER — Other Ambulatory Visit: Payer: Self-pay | Admitting: Family Medicine

## 2019-11-10 DIAGNOSIS — N184 Chronic kidney disease, stage 4 (severe): Secondary | ICD-10-CM

## 2019-11-13 ENCOUNTER — Ambulatory Visit
Admission: RE | Admit: 2019-11-13 | Discharge: 2019-11-13 | Disposition: A | Discharge status: Home / Self Care | Payer: Medicare Other | Source: Ambulatory Visit | Attending: Family Medicine | Admitting: Family Medicine

## 2019-11-13 DIAGNOSIS — N184 Chronic kidney disease, stage 4 (severe): Secondary | ICD-10-CM

## 2019-11-18 ENCOUNTER — Other Ambulatory Visit: Payer: Self-pay | Admitting: Family Medicine

## 2019-11-25 ENCOUNTER — Other Ambulatory Visit: Payer: Self-pay | Admitting: Family Medicine

## 2019-11-25 DIAGNOSIS — R29898 Other symptoms and signs involving the musculoskeletal system: Secondary | ICD-10-CM

## 2019-12-09 ENCOUNTER — Other Ambulatory Visit: Payer: Self-pay | Admitting: Cardiovascular Disease

## 2019-12-09 NOTE — Telephone Encounter (Signed)
Patient's sister is calling to follow up in regards to clopidogrel (PLAVIX) 75 MG tablet refill request.

## 2019-12-09 NOTE — Telephone Encounter (Signed)
New message    *STAT* If patient is at the pharmacy, call can be transferred to refill team.   1. Which medications need to be refilled? (please list name of each medication and dose if known) clopidogrel (PLAVIX) 75 MG tablet  2. Which pharmacy/location (including street and city if local pharmacy) is medication to be sent to?[facility]- [place], [place] - [place], [place] [address]  3. Do they need a 30 day or 90 day supply? [place]

## 2019-12-11 ENCOUNTER — Other Ambulatory Visit: Payer: Self-pay | Admitting: Cardiovascular Disease

## 2019-12-11 MED ORDER — CLOPIDOGREL BISULFATE 75 MG PO TABS: 75.0000 mg | ORAL_TABLET | Freq: Every day | ORAL | 2 refills | Status: DC

## 2019-12-11 NOTE — Telephone Encounter (Signed)
Rx request sent to pharmacy.  

## 2019-12-29 ENCOUNTER — Ambulatory Visit
Admission: RE | Admit: 2019-12-29 | Discharge: 2019-12-29 | Disposition: A | Discharge status: Home / Self Care | Payer: Medicare Other | Source: Ambulatory Visit | Attending: Family Medicine | Admitting: Family Medicine

## 2019-12-29 ENCOUNTER — Other Ambulatory Visit: Payer: Self-pay

## 2019-12-29 DIAGNOSIS — R29898 Other symptoms and signs involving the musculoskeletal system: Secondary | ICD-10-CM

## 2020-01-22 ENCOUNTER — Other Ambulatory Visit: Payer: Self-pay

## 2020-01-22 ENCOUNTER — Encounter: Payer: Self-pay | Admitting: Cardiovascular Disease

## 2020-01-22 ENCOUNTER — Ambulatory Visit (INDEPENDENT_AMBULATORY_CARE_PROVIDER_SITE_OTHER): Payer: Medicare Other | Admitting: Cardiovascular Disease

## 2020-01-22 VITALS — BP 122/74 | HR 57 | Ht 69.0 in | Wt 245.0 lb

## 2020-01-22 DIAGNOSIS — I2510 Atherosclerotic heart disease of native coronary artery without angina pectoris: Secondary | ICD-10-CM | POA: Diagnosis not present

## 2020-01-22 DIAGNOSIS — Z7901 Long term (current) use of anticoagulants: Secondary | ICD-10-CM

## 2020-01-22 DIAGNOSIS — I5022 Chronic systolic (congestive) heart failure: Secondary | ICD-10-CM

## 2020-01-22 DIAGNOSIS — N1832 Chronic kidney disease, stage 3b: Secondary | ICD-10-CM

## 2020-01-22 DIAGNOSIS — I4510 Unspecified right bundle-branch block: Secondary | ICD-10-CM

## 2020-01-22 DIAGNOSIS — I255 Ischemic cardiomyopathy: Secondary | ICD-10-CM | POA: Diagnosis not present

## 2020-01-22 DIAGNOSIS — E785 Hyperlipidemia, unspecified: Secondary | ICD-10-CM

## 2020-01-22 DIAGNOSIS — E119 Type 2 diabetes mellitus without complications: Secondary | ICD-10-CM

## 2020-01-22 DIAGNOSIS — G4733 Obstructive sleep apnea (adult) (pediatric): Secondary | ICD-10-CM

## 2020-01-22 NOTE — Patient Instructions (Signed)
Medication Instructions:  CONTINUE WITH CURRENT MEDICATIONS. NO CHANGES.  *If you need a refill on your cardiac medications before your next appointment, please call your pharmacy*   Testing/Procedures: Your physician has requested that you have an echocardiogram. Echocardiography is a painless test that uses sound waves to create images of your heart. It provides your doctor with information about the size and shape of your heart and how well your heart's chambers and valves are working. This procedure takes approximately one hour. There are no restrictions for this procedure.  [place]   Follow-Up: At [facility], you and your health needs are our priority.  As part of our continuing mission to provide you with exceptional heart care, we have created designated Provider Care Teams.  These Care Teams include your primary Cardiologist (physician) and Advanced Practice Providers (APPs -  Physician Assistants and Nurse Practitioners) who all work together to provide you with the care you need, when you need it.  We recommend signing up for the patient portal called "MyChart".  Sign up information is provided on this After Visit Summary.  MyChart is used to connect with patients for Virtual Visits (Telemedicine).  Patients are able to view lab/test results, encounter notes, upcoming appointments, etc.  Non-urgent messages can be sent to your provider as well.   To learn more about what you can do with MyChart, go to [email or web address].    Your next appointment:   3-4 month(s)  The format for your next appointment:   In Person  Provider:   Shelva Majestic, MD

## 2020-01-22 NOTE — Progress Notes (Signed)
Patient ID: Bobby [name], male   DOB: Jul 29, 1946, 74 y.o.   MRN: [id number]    HPI: Bobby [name] is a 74 y.o. male presents to the office today for a 7 month followup evaluation.   Bobby [name]  has a long-standing tobacco history having started smoking at age 58, a history of hypertension, remote small TIA/CVA who presented to the hospital on 08/04/2012 in the setting of inferior ST segment elevation myocardial infarction. Catheterization by me revealed a 99% stenosis of the RCA and concomitant CAD involving his LAD and circumflex vessels. He underwent acute percutaneous coronary intervention with an excellent door to balloon time of only 20 minutes and ultimately insertion of a 3.25x32 mm DES stent post dilated 3.3. An echo done in the hospital showed an EF of 50-60% with mild basal inferior probable scar and moderate pulmonary hypertension with PA pressure of 45 mm.  When I saw Bobby [name] in May 2014, he did note shortness of breath with activity. He denied definitive chest pain. At that time, he realized that he had inadvertentlystopped taking the atorvastatin 40 mg dose and this was resumed. In addition, his Toprol dose was increased from 50 to 75 mg daily. A nuclear study showed an ejection fraction of 66%. Perfusion was essentially normal with exception of a small region of fixed inferoseptal bowel artifact and possible small area of inferobasilar scar.   Bobby [name] unfortunately continues to smoke at least a pack of cigarettes per day.   He does experience shortness of breath with activity.  He denies recent chest pain.  He has chronic right bundle branch block with repolarization changes.  He is diabetic and has pulmonary hypertension.  A follow-up nuclear perfusion study on 03/26/2014 remianed low risk and showed normal LV function and normal wall motion.  There was a fixed inferior defect that was felt most consistent with diaphragmatic attenuation.  There was no evidence for ischemia.  He underwent  prostate surgery by Dr. [name] without cardiovascular compromise.    A screening abdominal aortic ultrasound revealed a normal abdominal aorta.  Since I  saw him in November 2017, he has had an extensive history and has had recurrent hospitalizations he was hospitalized in October 2018 for left arm pain.  Troponin was negative.  In December 2018 he suffered an anterior ST segment elevation MI due to acute occlusion of his mid LAD and underwent successful PCI of his LAD with insertion of a 2.5 x 22 mm Resolute Onyx DES stent.  He was also found to have 99% dominant mid left circumflex lesion with 80% stenosis in the OM1 vessel and underwent staged intervention to the mid circumflex and marginal vessels.  Troponin was greater than 65.  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  There was mention of sludge is a sign of pre-thrombotic state but no definite thrombus was seen at the apex.  He returned to the hospital August 20, 2017 with malaise and dyspnea.  He was bradycardic.  He was diuresed.  He has renal insufficiency with creatinines increasing up to 1.9.  He has been followed by [name] on numerous occasions in the office.  Unfortunately continues to smoke cigarettes.  He admits to 1/2 pack/day.  He does note occasional dizziness as well as some shortness of breath.  He denies recurrent anginal type symptoms.   Suggestive of low flow state.  When I saw him  on Jan 07, 2018  I recommended that he undergo a follow-up echo Doppler study to reassess systolic and diastolic function as well as potential for apical thrombus formation.  He had continued to be on aspirin and Brilinta following his STEMI.  He was on atorvastatin 80 mg for hyperlipidemia with target less than 70.  I had a long discussion regarding smoking cessation.  He underwent a follow-up echo Doppler study on Jan 20, 2018.  This showed reduced LV function with an EF of 35  to 40% with akinesis of the mid apical anterolateral and septal as well as apical walls.  There was grade 1 diastolic dysfunction.  There was now a possible small layered thrombus at the apex noted using Definity and also evidence of swirling at the LV apex suggestive of low flow state.   As result of the echo findings, I saw him in follow-up on Jan 24, 2018 at which time I had a very long discussion with him guarding potential thromboembolic stroke risk.  He was started on warfarin anticoagulation.  Apparently, the patient's medications have been in a  pack and had been followed by his girlfriend.  He was taking his medications correctly resulting in significant over anticoagulation with INR reached greater than 10.  He has been followed very closely by our pharmacy department and he also was given vitamin K.  Due to his poor medication compliance after much discussion ultimately the thought is for him to transition to Eliquis once his INR gets below 2.  He also has been taking his blood pressure medicines incorrectly.  Patient has been sleeping most of the day.  In the past I had recommended he undergo a sleep evaluation for sleep apnea which he never followed up with it was a no-show in the sleep lab.  He was worked into my schedule on February 21, 2018 and was seen as an add-on due to his medication issues.  He was here now with his Bobby [name] who in the past had done an excellent job in caring for his medications and she is committed now to resume doing this for him.  During his evaluation, he was exceptionally somnolent, and a significant time was spent with medication adjustment.  Due to his inability to take effect of warfarin the decision was made to use Eliquis for anticoagulation with more consistent dosing.  His recent laboratory had shown stage IV chronic kidney disease and it was recommended that he hold his lisinopril for several days and then reduce the dose to 5 mg as well as holding his furosemide with  subsequent reduction of dose with reinstitution at 20 mg.  I scheduled him for an expeditious sleep study due to concerns for significant sleep apnea and he was seen5 days later by Bobby [name] with medication adjustment and anticoagulation issues.  He underwent a sleep study on February 25, 2018 and he was found to have moderate overall sleep apnea with an AHI of 22.2 and RDI of 25.4.  He could not achieve any rem sleep and the overall severity may very well be underestimated.  He was titrated up to 10 cm water pressure.  An initial trial of CPAP auto with an EPR range of 8-15 was recommended.  When I last saw him on March 19, 2018 he had he had just initiated CPAP the evening before noted significant improvement in his sleep. His renal function had significantly improved from a creatinine of 2.7 down to 1.362 weeks ago.  He is unaware  of any recurrent episodes of arrhythmia.  Apparently his blood pressure had become elevated and Dr. Darron Doom added losartan to his medical regimen.  Of note, he also already was on lisinopril 2.5 mg, furosemide 20 mg, carvedilol 6.25 mg twice a day.  He has been only intermittently using CPAP due to complaints of the mask on his nose bridge.  As result, he was recently changed to a different mask.  A download was obtained in the office today in which he does not meet compliance.  [employer] is his DME company.  His set up date was March 17, 2018 and he has until June 17, 2018 to demonstrate compliance.    When I  saw him in September 2019 his blood pressure was elevated.  At that time apparently he was on both the previous lisinopril and the recent losartan that was started by Dr. Darron Doom.  I suggested he discontinue lisinopril and further titrated losartan to 25 mg twice a day both for improved blood pressure and for his reduced LV function.  He was not compliant with reference to his CPAP therapy and I had a long discussion with him regarding the importance that he meet compliance by  October 15.  I saw him in October 2019.  At that time he was not using his CPAP therapy.  I reviewed his blood pressure recordings and they seem to be consistently elevated.  He continues to be on Eliquis and was taking Lasix 20 mg.  That evaluation I had a long discussion with him guarding his noncompliance with CPAP.  I discussed alternatives such as customized oral appliance.  We discussed with his LV dysfunction he may be a candidate for Jardiance if his renal function is stable.  Was maintaining sinus rhythm.  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  His ECG was unchanged.  Troponins were negative.  He was discharged from the ER and seen by [name], [facility] on October 01, 2018.  He had not had any further chest pain since he left the hospital and no ischemic work-up was planned.  His renal function had worsened and his furosemide was changed to every other day.  Visibly he feels well and has not had recurrent chest pain.  He uses oxygen at 2 L nasal cannula at nighttime instead of his CPAP therapy.  Laboratory 2 days prior to his office visit has shown a creatinine of 2.07 with a BUN of 37.  Potassium was 5.4.   I last saw him in February 2020.  At that time his blood pressure was stable and he was taken furosemide 20 mg every other day, carvedilol 18.75 mg twice a day, and valsartan 40 mg.  Renal function improved with discontinuance of furosemide.  I last saw him in November 2020 when he came to the office with his Bobby [name]. He was on supplemental oxygen at nighttime but has not been using CPAP.  Upon further questioning he had difficulty with the mask on the bridge of his nose.  Recently, his blood pressure has been elevated and he has been of seen by Dr. Darron Doom.  He apparently was now on losartan 50 mg in place of valsartan, carvedilol 18.75 mg twice a day, and he  is on Plavix in addition to apixaban 5 mg twice a day.  He continued to be on atorvastatin  80 mg daily for hyperlipidemia.  He is diabetic on metformin.  He is on bupropion 300 mg for depression.  During that evaluation, his blood pressure was elevated at 162/90 I recommended further titration of carvedilol to 25 mg twice a day and added amlodipine 5 mg.  I again discussed the importance of using CPAP therapy.  He had stage III chronic kidney disease with creatinine improving from 2.07 to 1.77.  Since I last saw him, he denies any chest pain.  He was treated with an infection by Dr. Darron Doom and at times has had some recurrent congestion.  He is breathing better.  He uses supplemental oxygen but admits that at times he does not use it every night.  Unfortunately he still smoking a pack of cigarettes lasting 2 to 2-1/2 days.  Laboratory on November 05, 2019 showed a creatinine at 2.25.  He has recently been on [employer] in addition to albuterol for wheezing.  However he had was not consistently using his combination drug treatment.  He presents for evaluation.  Past Medical History:  Diagnosis Date  . CAD (coronary artery disease),residual non obstructive disease    a. 08/2012 Inferior STEMI: RCA 99% (3.5x32 Promus DES), nonobs LAD/LCX dzs;  b. 01/2013 MV: EF 66%, small inferoseptal artifact and possible small inferobasilar scar;  c. 03/2014 MV: fixed inf defect->diaph attenuation, no ischemia. 12/18 PCI/DES to mLAD, staged PCI/DES Lcx/OM  . DM (diabetes mellitus), type 2 new diagnosis 08/05/2012   diet control  . ED (erectile dysfunction)    surgery planned  . History of echocardiogram    a. 08/2012 Echo: EF 55-60%, possible mild basal inferior HK, mildly dil RA, PASP 81mHg.  .Marland KitchenHyperlipidemia 08/05/2012  . Hypertensive heart disease   . MI (myocardial infarction) ([facility] 08/04/2012   acute inferior stemi secondary to RCA occlusion; PCI  . Panic attack   . [facility] [place]    echo 08/05/12, EF  55-60%, PA pressure 414m . S/P coronary artery stent placement, to RCA Promus DES 08/05/2012  [facility]   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  . Tobacco abuse     Past Surgical History:  Procedure Laterality Date  . CARDIAC CATHETERIZATION  08/04/12   PCI to RCA with DES  . CORONARY STENT INTERVENTION N/A 08/12/2017   Procedure: CORONARY STENT INTERVENTION;  Surgeon: JoMartiniquePeter M, MD;  [facility];  Service: Cardiovascular;  Laterality: N/A;  . CORONARY/GRAFT ACUTE MI REVASCULARIZATION N/A 08/09/2017   Procedure: Coronary/Graft Acute MI Revascularization;  Surgeon: SmBelva CromeMD;  [facility];  Service: Cardiovascular;  Laterality: N/A;  . LEFT HEART CATH AND CORONARY ANGIOGRAPHY N/A 08/09/2017   Procedure: LEFT HEART CATH AND CORONARY ANGIOGRAPHY;  Surgeon: SmBelva CromeMD;  [facility];  Service: Cardiovascular;  Laterality: N/A;  . LEFT HEART CATHETERIZATION WITH CORONARY ANGIOGRAM N/A 08/04/2012   Procedure: LEFT HEART CATHETERIZATION WITH CORONARY ANGIOGRAM;  Surgeon: ThTroy SineMD;  [facility];  Service: Cardiovascular;  Laterality: N/A;  . LEG SURGERY Left    rod placed for fracture repair  . PENILE PROSTHESIS IMPLANT N/A 06/03/2014   Procedure: IMPLANT PENILE PROTHESIS INFLATABLE;  Surgeon: SiAilene RudMD;  Location: [facility];  Service: Urology;  Laterality: N/A;  with penile block--0.5% marcaine plain  . PERCUTANEOUS CORONARY STENT INTERVENTION (PCI-S) Right 08/04/2012   Procedure: PERCUTANEOUS CORONARY STENT INTERVENTION (PCI-S);  Surgeon: ThTroy SineMD;  [facility];  Service: Cardiovascular;  Laterality: Right;  . THUMB ARTHROSCOPY Left    thumb joint replaced    Allergies  Allergen Reactions  . Hydrocodone Itching    Current Outpatient Medications  Medication Sig Dispense Refill  . albuterol (PROVENTIL HFA;VENTOLIN HFA) 108 (90 Base) MCG/ACT inhaler Inhale 2 puffs into the  lungs every 6 (six) hours as needed for wheezing or shortness of breath. 1 Inhaler 2  . albuterol (PROVENTIL) (2.5 MG/3ML) 0.083% nebulizer solution Take 5 mg by nebulization every 4 (four) hours as needed. For wheezing or SOB    . amLODipine (NORVASC) 5 MG tablet Take 5 mg by mouth in the morning and at bedtime.    [name] apixaban (ELIQUIS) 5 MG TABS tablet Take 1 tablet (5 mg total) by mouth 2 (two) times daily. 28 tablet 1  . atorvastatin (LIPITOR) 80 MG tablet Take 1 tablet (80 mg total) by mouth daily at 6 PM. 90 tablet 3  . benzonatate (TESSALON) 100 MG capsule Take 100-200 mg by mouth 3 (three) times daily as needed.    [name] BREZTRI AEROSPHERE 160-9-4.8 MCG/ACT AERO Inhale 2 puffs into the lungs 2 (two) times daily.    [name] buPROPion (WELLBUTRIN XL) 300 MG 24 hr tablet Take 300 mg by mouth daily.    . carvedilol (COREG) 25 MG tablet TAKE 1 TABLET(25 MG) BY MOUTH TWICE DAILY 180 tablet 3  . clonazePAM (KLONOPIN) 0.5 MG tablet Take 1 mg by mouth 2 (two) times daily.   0  . clopidogrel (PLAVIX) 75 MG tablet Take 1 tablet (75 mg total) by mouth daily. 90 tablet 2  . Coenzyme Q10 (COQ-10) 100 MG CAPS Take 1 capsule by mouth daily.     [name] COLCRYS 0.6 MG tablet Take 0.6 mg by mouth daily.  3  . losartan (COZAAR) 50 MG tablet Take 1 tablet (50 mg total) by mouth daily. 90 tablet 3  . metFORMIN (GLUCOPHAGE-XR) 500 MG 24 hr tablet Take 500 mg by mouth daily.  3  . nitroGLYCERIN (NITROSTAT) 0.4 MG SL tablet DISSOLVE 1 TABLET UNDER THE TONGUE EVERY 5 MINUTES AS NEEDED FOR CHEST PAIN. DO NOT EXCEED A TOTAL OF 3 DOSES IN 15 MINUTES. 25 tablet 2  . Omega-3 Fatty Acids (FISH OIL PO) Take 1 tablet by mouth daily.    [name] oxyCODONE-acetaminophen (PERCOCET) 10-325 MG per tablet Take 1 tablet by mouth 3 (three) times daily.     . sertraline (ZOLOFT) 100 MG tablet Take 100 mg by mouth daily.     No current facility-administered medications for this visit.    Socially,  he is divorced. He doesn't walk but not routinely  exercise.  He continues to smoke one pack of cigarettes per day.  ROS General: Negative; No fevers, chills, or night sweats;  HEENT: Negative; No changes in vision or hearing, sinus congestion, difficulty swallowing Pulmonary:  Positive for shortness of breath; No cough, wheezing, , hemoptysis Cardiovascular: See history of present illness GI: Negative; No nausea, vomiting, diarrhea, or abdominal pain GU: Negative; No dysuria, hematuria, or difficulty voiding Musculoskeletal: Negative; no myalgias, joint pain, or weakness Hematologic/Oncology: Negative; no easy bruising, bleeding Endocrine: Positive for diabetes no heat/cold intolerance;  Neuro: Negative; no changes in balance, headaches Skin: Negative; No rashes or skin lesions Psychiatric: Negative; No behavioral problems, depression Sleep: As it of her obstructive sleep apnea, CPAP initiated March 17, 2018. no bruxism, restless legs, hypnogognic hallucinations, no cataplexy Other comprehensive 14 point system review is negative.   PE BP  122/74 (BP Location: Right Arm, Patient Position: Sitting, Cuff Size: Large)   Pulse (!) 57   Ht 5' 9"  (1.753 m)   Wt 245 lb (111.1 kg)   SpO2 91%   BMI 36.18 kg/m    Repeat blood pressure by me 120/72  Wt Readings from Last 3 Encounters:  01/22/20 245 lb (111.1 kg)  11/05/19 250 lb (113.4 kg)  07/24/19 256 lb 6.4 oz (116.3 kg)   General: Alert, oriented, no distress.  Skin: normal turgor, no rashes, warm and dry HEENT: Normocephalic, atraumatic. Pupils equal round and reactive to light; sclera anicteric; extraocular muscles intact;  Nose without nasal septal hypertrophy Mouth/Parynx benign; Mallinpatti scale 3/4 Neck: No JVD, no carotid bruits; normal carotid upstroke Lungs: Wheezing, mild both inspiratory and expiratory  Chest wall: without tenderness to palpitation Heart: PMI not displaced, RRR, s1 s2 normal, 1/6 systolic murmur, no diastolic murmur, no rubs, gallops, thrills, or  heaves Abdomen: soft, nontender; no hepatosplenomehaly, BS+; abdominal aorta nontender and not dilated by palpation. Back: no CVA tenderness Pulses 2+ Musculoskeletal: full range of motion, normal strength, no joint deformities Extremities: no clubbing cyanosis or edema, Homan's sign negative  Neurologic: grossly nonfocal; Cranial nerves grossly wnl Psychologic: Normal mood and affect   ECG (independently read by me): Sinus bradycarrdia at 57; RBBB, Q V1-3 ands avF  July 24, 2019 ECG (independently read by me): Normal sinus rhythm at 80 bpm.  Right bundle branch block; previously noted Q waves V1 through V4 and 2 3 and F consistent with prior infarct  October 15, 2018 ECG (independently read by me): Normal sinus rhythm at 93 bpm with baseline artifact.  Right bundle branch block.  Anterior Q waves consistent with prior infarct.  Inferior Q waves.  ECG (independently read by me): Sinus rhythm at 65 bpm.  PAC.  Right bundle branch block, inferior Q waves in 3 and aVF, old anterior myocardial infarction.  Lateral T wave abnormality.  Normal intervals.  June 16, 2018 ECG (independently read by me): Normal sinus rhythm at 84 bpm.  Right bundle branch block with repolarization changes.  Inferior Q waves consistent with prior infarct.  Anteroseptal Q waves consistent with prior anterior MI.  May 28, 2018 ECG (independently read by me): Normal sinus rhythm with sinus arrhythmia.  Right bundle branch block with repolarization changes.  Q waves consistent with anteroseptal MI and possible inferior MI no indication to adjust the pain upon his needs  July 17,2019 ECG (independently read by me): Normal sinus rhythm 89 bpm.  Right bundle branch block with repolarization changes.  Inferior Q waves and anterior  Q waves consistent with anterolateral infarct with possible inferior infarct  February 21, 2018 ECG (independently read by me): Normal sinus rhythm at 95 bpm, right bundle branch block with  repolarization changes.  Anterolateral MI.  QTc interval 477 ms  Jan 24, 2018 ECG (independently read by me): Sinus rhythm at 97 bpm.  Right bundle branch block with repolarization changes.  QTc interval 472 ms.  Anterolateral Q waves  Jan 07, 2018 ECG (independently read by me): Normal sinus rhythm at 88 bpm.  Right bundle branch block with repolarization changes.  Inferior Q waves.  T wave anteroseptally, possible anteroseptal MI undetermined.  November 2017 ECG (independently read by me): Normal sinus rhythm at 64 bpm.  Branch block with repolarization changes.  QTc interval 460 ms.  June 2016 ECG (independently read by me): Sinus bradycardia at 57 bpm, right bundle branch block with repositioning changes.  QTc interval 459 ms.  September 2015 ECG (independently read by me and (: Normal sinus rhythm.  Right bundle branch block with repolarization changes.  Ventricular rate 81.  02/25/2014 ECG Normal sinus rhythm.  Right bundle branch block with repolarization changes.  PR interval 148 ms; QTc interval 463 ms  PriorECG: sinus rhythm at 76 beats per minute; right bundle-branch block with repolarization changes. PR interval 142 ms, QTC 481 ms.   LABS:  BMP Latest Ref Rng & Units 11/05/2019 01/21/2019 10/13/2018  Glucose 70 - 99 mg/dL 104(H) 81 97  BUN 8 - 23 mg/dL 24(H) 26 37(H)  Creatinine 0.61 - 1.24 mg/dL 2.25(H) 1.81(H) 2.07(H)  BUN/Creat Ratio 10 - 24 - 14 18  Sodium 135 - 145 mmol/L 138 138 139  Potassium 3.5 - 5.1 mmol/L 4.8 4.7 5.4(H)  Chloride 98 - 111 mmol/L 105 103 104  CO2 22 - 32 mmol/L 24 21 19(L)  Calcium 8.9 - 10.3 mg/dL 9.3 9.8 9.7   Hepatic Function Latest Ref Rng & Units 11/05/2019 01/13/2018 08/20/2017  Total Protein 6.5 - 8.1 g/dL 6.5 6.6 6.9  Albumin 3.5 - 5.0 g/dL 3.9 4.2 3.7  AST 15 - 41 U/L 17 22 26   ALT 0 - 44 U/L 22 30 44  Alk Phosphatase 38 - 126 U/L 94 129(H) 106  Total Bilirubin 0.3 - 1.2 mg/dL 0.7 <0.2 0.4   CBC Latest Ref Rng & Units 11/05/2019 01/21/2019  09/18/2018  WBC 4.0 - 10.5 K/uL 7.2 8.1 8.7  Hemoglobin 13.0 - 17.0 g/dL 13.9 14.1 13.1  Hematocrit 39.0 - 52.0 % 44.3 40.5 42.1  Platelets 150 - 400 K/uL 156 182 179   Lab Results  Component Value Date   MCV 94.3 11/05/2019   MCV 85 01/21/2019   MCV 89.8 09/18/2018   Lab Results  Component Value Date   TSH 0.598 02/21/2018   Lipid Panel     Component Value Date/Time   CHOL 148 01/13/2018 0839   TRIG 265 (H) 01/13/2018 0839   HDL 36 (L) 01/13/2018 0839   CHOLHDL 4.1 01/13/2018 0839   CHOLHDL 4.8 08/09/2017 1117   VLDL 52 (H) 08/09/2017 1117   LDLCALC 59 01/13/2018 0839   INR result since initiation of Coumadin: 1:> 7.6;> >10;> 8.6 (Vit K administered) >4.3; no longer on Coumadin, now on Eliquis  RADIOLOGY: No results found.  IMPRESSION: 1. Coronary artery disease involving native coronary artery of native heart without angina pectoris   2. Chronic systolic heart failure ([place])   3. Ischemic cardiomyopathy   4. OSA (obstructive sleep apnea)   5. Stage 3b chronic kidney disease   6. Long term (current) use of anticoagulants   7. Hyperlipidemia LDL goal <70   8. RBBB   9. Controlled type 2 diabetes mellitus without complication, without long-term current use of insulin (HCC)     ASSESSMENT AND PLAN:  Mr. Stamas is a 74 year old Caucasian male who suffered an acute coronary syndrome on 08/04/2012 when he presented with subtotal occlusion of his RCA. He had diffuse disease beyond the subtotal occlusion and ultimately had successful insertion of a 3.25x32 mm Promus DES stent. He had concomitant CAD with 40-50% proximal LAD narrowing, 30% circumflex marginal stenoses. NMR lipoprofile after initiation of atorvastatin in January 2014 showed marked improvement in total cholesterol of 119 LDL 48 LDL particle #992. HDL was very low at 30 with an increased triglyceride at 203 and significantly reduced HDL particle #24.2. His insulin resistance score was elevated at  35.  He suffered an  anterior STEMI in December 2018 and underwent successful stenting and several days later he required staged intervention to circumflex and marginal vessel. 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  Warfarin was initially prescribed but due to significant issues with compliance and overmedication the decision was made that he would be safer using Eliquis even though he is on this for apical thrombus rather than atrial fibrillation.   He was subsequently found to have significant obstructive sleep apnea but stopped therapy.  He is now using nocturnal oxygen.  When I last saw him, he was significantly hypertensive and adjustments were made to his medical therapy.  He is now on amlodipine 5 mg, carvedilol 25 mg twice a day, and losartan 50 mg daily.  Blood pressure today is stable and on repeat by me was 120/72.  He has been wheezing on exam.  He states that he has not been consistently using the [employer] and have recommended he reinstitute therapy as initially prescribed by Dr. Delfino Lovett with 2 puffs twice a day.  If his wheezing does not improve I have recommended follow-up with Dr. Darron Doom.  He continues to be on atorvastatin 80 mg daily for hyperlipidemia.  LDL cholesterol I do not believe has recently been checked.  His last echo Doppler study was in May 2019 which showed an EF of 35 to 40%.  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  He is diabetic on Metformin.  He continues to be on appropriate for depression.  He has chronic kidney disease and apparently recent creatinine on March 4 had increased to 2.25 and on follow-up was 1.93 on November 16, 2019 consistent with stage IIIb chronic kidney disease.  Despite his obstructive sleep apnea, he is now using CPAP therapy. I will see him in 3 to 4 months for follow-up evaluation.    Troy Sine, MD, [facility]   01/24/2020 3:44 PM

## 2020-01-24 ENCOUNTER — Encounter: Payer: Self-pay | Admitting: Cardiovascular Disease

## 2020-02-15 ENCOUNTER — Ambulatory Visit (HOSPITAL_COMMUNITY): Payer: Medicare Other | Attending: Cardiology

## 2020-02-15 ENCOUNTER — Other Ambulatory Visit: Payer: Self-pay

## 2020-02-15 DIAGNOSIS — G4733 Obstructive sleep apnea (adult) (pediatric): Secondary | ICD-10-CM | POA: Diagnosis present

## 2020-02-15 DIAGNOSIS — I2510 Atherosclerotic heart disease of native coronary artery without angina pectoris: Secondary | ICD-10-CM | POA: Insufficient documentation

## 2020-02-15 DIAGNOSIS — I5022 Chronic systolic (congestive) heart failure: Secondary | ICD-10-CM | POA: Insufficient documentation

## 2020-02-15 DIAGNOSIS — I255 Ischemic cardiomyopathy: Secondary | ICD-10-CM | POA: Insufficient documentation

## 2020-02-15 MED ORDER — PERFLUTREN LIPID MICROSPHERE
1.0000 mL | INTRAVENOUS | Status: AC | PRN
  Administered 2020-02-15: 2 mL via INTRAVENOUS

## 2020-03-07 ENCOUNTER — Other Ambulatory Visit: Payer: Self-pay | Admitting: Cardiovascular Disease

## 2020-03-31 ENCOUNTER — Other Ambulatory Visit: Payer: Self-pay | Admitting: Cardiovascular Disease

## 2020-04-04 ENCOUNTER — Other Ambulatory Visit: Payer: Self-pay | Admitting: Cardiovascular Disease

## 2020-05-02 ENCOUNTER — Ambulatory Visit (INDEPENDENT_AMBULATORY_CARE_PROVIDER_SITE_OTHER): Payer: Medicare Other | Admitting: Cardiovascular Disease

## 2020-05-02 ENCOUNTER — Other Ambulatory Visit: Payer: Self-pay

## 2020-05-02 VITALS — BP 120/80 | HR 69 | Ht 69.0 in | Wt 251.0 lb

## 2020-05-02 DIAGNOSIS — N184 Chronic kidney disease, stage 4 (severe): Secondary | ICD-10-CM

## 2020-05-02 DIAGNOSIS — I10 Essential (primary) hypertension: Secondary | ICD-10-CM

## 2020-05-02 DIAGNOSIS — E785 Hyperlipidemia, unspecified: Secondary | ICD-10-CM

## 2020-05-02 DIAGNOSIS — I2510 Atherosclerotic heart disease of native coronary artery without angina pectoris: Secondary | ICD-10-CM

## 2020-05-02 DIAGNOSIS — I255 Ischemic cardiomyopathy: Secondary | ICD-10-CM

## 2020-05-02 DIAGNOSIS — Z7901 Long term (current) use of anticoagulants: Secondary | ICD-10-CM

## 2020-05-02 DIAGNOSIS — I5022 Chronic systolic (congestive) heart failure: Secondary | ICD-10-CM | POA: Diagnosis not present

## 2020-05-02 DIAGNOSIS — F32A Depression, unspecified: Secondary | ICD-10-CM

## 2020-05-02 DIAGNOSIS — F329 Major depressive disorder, single episode, unspecified: Secondary | ICD-10-CM

## 2020-05-02 DIAGNOSIS — Z720 Tobacco use: Secondary | ICD-10-CM

## 2020-05-02 NOTE — Patient Instructions (Signed)

## 2020-05-02 NOTE — Progress Notes (Signed)
Patient ID: Bobby [name], male   DOB: 09/01/1946, 74 y.o.   MRN: [id number]    HPI: Bobby [name] is a 74 y.o. male presents to the office today for a 3 month followup evaluation.   Bobby [name]  has a long-standing tobacco history having started smoking at age 78, a history of hypertension, remote small TIA/CVA who presented to the hospital on 08/04/2012 in the setting of inferior ST segment elevation myocardial infarction. Catheterization by me revealed a 99% stenosis of the RCA and concomitant CAD involving his LAD and circumflex vessels. He underwent acute percutaneous coronary intervention with an excellent door to balloon time of only 20 minutes and ultimately insertion of a 3.25x32 mm DES stent post dilated 3.3. An echo done in the hospital showed an EF of 50-60% with mild basal inferior probable scar and moderate pulmonary hypertension with PA pressure of 45 mm.  When I saw Bobby [name] in May 2014, he did note shortness of breath with activity. He denied definitive chest pain. At that time, he realized that he had inadvertentlystopped taking the atorvastatin 40 mg dose and this was resumed. In addition, his Toprol dose was increased from 50 to 75 mg daily. A nuclear study showed an ejection fraction of 66%. Perfusion was essentially normal with exception of a small region of fixed inferoseptal bowel artifact and possible small area of inferobasilar scar.   Bobby [name] unfortunately continues to smoke at least a pack of cigarettes per day.   He does experience shortness of breath with activity.  He denies recent chest pain.  He has chronic right bundle branch block with repolarization changes.  He is diabetic and has pulmonary hypertension.  A follow-up nuclear perfusion study on 03/26/2014 remianed low risk and showed normal LV function and normal wall motion.  There was a fixed inferior defect that was felt most consistent with diaphragmatic attenuation.  There was no evidence for ischemia.  He underwent  prostate surgery by Bobby [name] without cardiovascular compromise.    A screening abdominal aortic ultrasound revealed a normal abdominal aorta.  Since I saw him in November 2017, he had an extensive history and has had recurrent hospitalizations he was hospitalized in October 2018 for left arm pain.  Troponin was negative.  In December 2018 he suffered an anterior ST segment elevation MI due to acute occlusion of his mid LAD and underwent successful PCI of his LAD with insertion of a 2.5 x 22 mm Resolute Onyx DES stent.  He was also found to have 99% dominant mid left circumflex lesion with 80% stenosis in the OM1 vessel and underwent staged intervention to the mid circumflex and marginal vessels.  Troponin was greater than 65.  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  There was mention of sludge is a sign of pre-thrombotic state but no definite thrombus was seen at the apex.  He returned to the hospital August 20, 2017 with malaise and dyspnea.  He was bradycardic.  He was diuresed.  He has renal insufficiency with creatinines increasing up to 1.9.  He has been followed by Bobby [name] on numerous occasions in the office.  Unfortunately continues to smoke cigarettes.  He admits to 1/2 pack/day.  He does note occasional dizziness as well as some shortness of breath.  He denies recurrent anginal type symptoms.   Suggestive of low flow state.  When I saw him on Jan 07, 2018  I recommended that he undergo a follow-up echo Doppler study to reassess systolic and diastolic function as well as potential for apical thrombus formation.  He had continued to be on aspirin and Brilinta following his STEMI.  He was on atorvastatin 80 mg for hyperlipidemia with target less than 70.  I had a long discussion regarding smoking cessation.  He underwent a follow-up echo Doppler study on Jan 20, 2018.  This showed reduced LV function with an EF of 35 to  40% with akinesis of the mid apical anterolateral and septal as well as apical walls.  There was grade 1 diastolic dysfunction.  There was now a possible small layered thrombus at the apex noted using Definity and also evidence of swirling at the LV apex suggestive of low flow state.   As result of the echo findings, I saw him in follow-up on Jan 24, 2018 at which time I had a very long discussion with him guarding potential thromboembolic stroke risk.  He was started on warfarin anticoagulation.  Apparently, the patient's medications have been in a  pack and had been followed by his girlfriend.  He was taking his medications correctly resulting in significant over anticoagulation with INR reached greater than 10.  He has been followed very closely by our pharmacy department and he also was given vitamin K.  Due to his poor medication compliance after much discussion ultimately the thought is for him to transition to Eliquis once his INR gets below 2.  He also has been taking his blood pressure medicines incorrectly.  Patient has been sleeping most of the day.  In the past I had recommended he undergo a sleep evaluation for sleep apnea which he never followed up with it was a no-show in the sleep lab.  He was worked into my schedule on February 21, 2018 and was seen as an add-on due to his medication issues.  He was here now with his sister who in the past had done an excellent job in caring for his medications and she is committed now to resume doing this for him.  During his evaluation, he was exceptionally somnolent, and a significant time was spent with medication adjustment.  Due to his inability to take effect of warfarin the decision was made to use Eliquis for anticoagulation with more consistent dosing.  His recent laboratory had shown stage IV chronic kidney disease and it was recommended that he hold his lisinopril for several days and then reduce the dose to 5 mg as well as holding his furosemide with  subsequent reduction of dose with reinstitution at 20 mg.  I scheduled him for an expeditious sleep study due to concerns for significant sleep apnea and he was seen5 days later by Racquel with medication adjustment and anticoagulation issues.  He underwent a sleep study on February 25, 2018 and he was found to have moderate overall sleep apnea with an AHI of 22.2 and RDI of 25.4.  He could not achieve any rem sleep and the overall severity may very well be underestimated.  He was titrated up to 10 cm water pressure.  An initial trial of CPAP auto with an EPR range of 8-15 was recommended.  When I last saw him on March 19, 2018 he had he had just initiated CPAP the evening before noted significant improvement in his sleep. His renal function had significantly improved from a creatinine of 2.7 down to 1.362 weeks ago.  He is unaware of any  recurrent episodes of arrhythmia.  Apparently his blood pressure had become elevated and Bobby [name] added losartan to his medical regimen.  Of note, he also already was on lisinopril 2.5 mg, furosemide 20 mg, carvedilol 6.25 mg twice a day.  He has been only intermittently using CPAP due to complaints of the mask on his nose bridge.  As result, he was recently changed to a different mask.  A download was obtained in the office today in which he does not meet compliance.  [employer] is his DME company.  His set up date was March 17, 2018 and he has until June 17, 2018 to demonstrate compliance.    When I saw him in September 2019 his blood pressure was elevated.  At that time apparently he was on both the previous lisinopril and the recent losartan that was started by Bobby [name].  I suggested he discontinue lisinopril and further titrated losartan to 25 mg twice a day both for improved blood pressure and for his reduced LV function.  He was not compliant with reference to his CPAP therapy and I had a long discussion with him regarding the importance that he meet compliance by  October 15.  I saw him in October 2019.  At that time he was not using his CPAP therapy.  I reviewed his blood pressure recordings and they seem to be consistently elevated.  He continues to be on Eliquis and was taking Lasix 20 mg.  That evaluation I had a long discussion with him guarding his noncompliance with CPAP.  I discussed alternatives such as customized oral appliance.  We discussed with his LV dysfunction he may be a candidate for Jardiance if his renal function is stable.  Was maintaining sinus rhythm.  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  His ECG was unchanged.  Troponins were negative.  He was discharged from the ER and seen by Bobby [name], [facility] on October 01, 2018.  He had not had any further chest pain since he left the hospital and no ischemic work-up was planned.  His renal function had worsened and his furosemide was changed to every other day.  Visibly he feels well and has not had recurrent chest pain.  He uses oxygen at 2 L nasal cannula at nighttime instead of his CPAP therapy.  Laboratory 2 days prior to his office visit has shown a creatinine of 2.07 with a BUN of 37.  Potassium was 5.4.   I last saw him in February 2020.  At that time his blood pressure was stable and he was taken furosemide 20 mg every other day, carvedilol 18.75 mg twice a day, and valsartan 40 mg.  Renal function improved with discontinuance of furosemide.  I saw him in November 2020 when he came to the office with his sister. He was on supplemental oxygen at nighttime but has not been using CPAP.  Upon further questioning he had difficulty with the mask on the bridge of his nose.  Recently, his blood pressure has been elevated and he has been of seen by Bobby [name].  He apparently was now on losartan 50 mg in place of valsartan, carvedilol 18.75 mg twice a day, and he is  on Plavix in addition to apixaban 5 mg twice a day.  He continued to be on atorvastatin 80 mg daily for  hyperlipidemia.  He is diabetic on metformin.  He is on bupropion 300 mg for depression.  During that evaluation, his blood pressure was elevated at 162/90 I recommended further titration of carvedilol to 25 mg twice a day and added amlodipine 5 mg.  I again discussed the importance of using CPAP therapy.  He had stage III chronic kidney disease with creatinine improving from 2.07 to 1.77.   I last saw him in May 2021 at which time any chest pain.He was treated with an infection by Bobby [name] and at times has had some recurrent congestion.  He is breathing better.  He uses supplemental oxygen but admits that at times he does not use it every night.  Unfortunately he still smoking a pack of cigarettes lasting 2 to 2-1/2 days.  Laboratory on November 05, 2019 showed a creatinine at 2.25.  He has recently been on [employer] in addition to albuterol for wheezing.  However he had was not consistently using his combination drug treatment.    Over the last several months, he has continued to be stable from a cardiac standpoint.  He underwent a follow-up echo Doppler study on February 15, 2020 which showed an EF of 35 to 40%.  There was mild LVH of the basal septal segment.  There was grade 1 diastolic dysfunction.  He had previously noted akinesis of the apex and basal inferior wall and mid apical anteroseptal wall.  There was no evidence for residual thrombus.  Last week he under went right neck melanoma resection by Bobby [name].  He has further decreased his tobacco use.  He presents for evaluation.  Past Medical History:  Diagnosis Date  . CAD (coronary artery disease),residual non obstructive disease    a. 08/2012 Inferior STEMI: RCA 99% (3.5x32 Promus DES), nonobs LAD/LCX dzs;  b. 01/2013 MV: EF 66%, small inferoseptal artifact and possible small inferobasilar scar;  c. 03/2014 MV: fixed inf defect->diaph  attenuation, no ischemia. 12/18 PCI/DES to mLAD, staged PCI/DES Lcx/OM  . DM (diabetes mellitus), type 2 new diagnosis 08/05/2012   diet control  . ED (erectile dysfunction)    surgery planned  . History of echocardiogram    a. 08/2012 Echo: EF 55-60%, possible mild basal inferior HK, mildly dil RA, PASP 17mHg.  .Marland KitchenHyperlipidemia 08/05/2012  . Hypertensive heart disease   . [place]   acute inferior stemi secondary to RCA occlusion; PCI  . Panic attack   . [place]monary HTN ([place]    echo 08/05/12, EF 55-60%, PA pressure 453m . S/P coronary artery stent placement, to RCA Promus DES 08/05/2012  . [facility]   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  . Tobacco abuse     Past Surgical History:  Procedure Laterality Date  . CARDIAC CATHETERIZATION  08/04/12   PCI to RCA with DES  . CORONARY STENT INTERVENTION N/A 08/12/2017   Procedure: CORONARY STENT INTERVENTION;  Surgeon: Bobby [name], Bobby [name];  [place] [facility];  Service: Cardiovascular;  Laterality: N/A;  . CORONARY/GRAFT ACUTE MI REVASCULARIZATION N/A 08/09/2017   Procedure: Coronary/Graft Acute MI Revascularization;  Surgeon: Bobby [name];  [place] [facility] [place];  Service: Cardiovascular;  Laterality: N/A;  . LEFT HEART CATH AND CORONARY ANGIOGRAPHY N/A 08/09/2017   Procedure: LEFT HEART CATH AND CORONARY ANGIOGRAPHY;  Surgeon: Bobby [name];  [facility];  Service: Cardiovascular;  Laterality: N/A;  . LEFT HEART CATHETERIZATION WITH CORONARY ANGIOGRAM N/A 08/04/2012  Procedure: LEFT HEART CATHETERIZATION WITH CORONARY ANGIOGRAM;  Surgeon: Bobby Sine, Bobby [name];  Location: [facility] C[facility] LAB;  Service: Cardiovascular;  Laterality: N/A;  . LEG SURGERY Left    rod placed for fracture repair  . PENILE PROSTHESIS IMPLANT N/A 06/03/2014   Procedure: IMPLANT PENILE PROTHESIS INFLATABLE;  Surgeon: Ailene Rud, Bobby [name];  Location: [facility];  Service: Urology;  Laterality: N/A;  with  penile block--0.5% marcaine plain  . PERCUTANEOUS CORONARY STENT INTERVENTION (PCI-S) Right 08/04/2012   Procedure: PERCUTANEOUS CORONARY STENT INTERVENTION (PCI-S);  Surgeon: Bobby Sine, Bobby [name];  Location[facility] C[facility] LAB;  Service: Cardiovascular;  Laterality: Right;  . THUMB ARTHROSCOPY Left    thumb joint replaced    Allergies  Allergen Reactions  . Hydrocodone Itching    Current Outpatient Medications  Medication Sig Dispense Refill  . albuterol (PROVENTIL HFA;VENTOLIN HFA) 108 (90 Base) MCG/ACT inhaler Inhale 2 puffs into the lungs every 6 (six) hours as needed for wheezing or shortness of breath. 1 Inhaler 2  . albuterol (PROVENTIL) (2.5 MG/3ML) 0.083% nebulizer solution Take 5 mg by nebulization every 4 (four) hours as needed. For wheezing or SOB    . amLODipine (NORVASC) 5 MG tablet TAKE 1 TABLET BY MOUTH DAILY 90 tablet 3  . apixaban (ELIQUIS) 5 MG TABS tablet Take 1 tablet (5 mg total) by mouth 2 (two) times daily. 28 tablet 1  . atorvastatin (LIPITOR) 80 MG tablet Take 1 tablet (80 mg total) by mouth daily at 6 PM. 90 tablet 3  . benzonatate (TESSALON) 100 MG capsule Take 100-200 mg by mouth 3 (three) times daily as needed.    [name] BREZTRI AEROSPHERE 160-9-4.8 MCG/ACT AERO Inhale 2 puffs into the lungs 2 (two) times daily.    [name] buPROPion (WELLBUTRIN XL) 300 MG 24 hr tablet Take 300 mg by mouth daily.    . carvedilol (COREG) 25 MG tablet TAKE 1 TABLET(25 MG) BY MOUTH TWICE DAILY 180 tablet 3  . clonazePAM (KLONOPIN) 0.5 MG tablet Take 1 mg by mouth 2 (two) times daily.   0  . clopidogrel (PLAVIX) 75 MG tablet Take 1 tablet (75 mg total) by mouth daily. 90 tablet 2  . Coenzyme Q10 (COQ-10) 100 MG CAPS Take 1 capsule by mouth daily.     [name] COLCRYS 0.6 MG tablet Take 0.6 mg by mouth daily.  3  . losartan (COZAAR) 50 MG tablet TAKE ONE TABLET BY MOUTH DAILY 90 tablet 3  . metFORMIN (GLUCOPHAGE-XR) 500 MG 24 hr tablet Take 500 mg by mouth daily.  3  . nitroGLYCERIN (NITROSTAT) 0.4 MG SL  tablet DISSOLVE 1 TABLET UNDER THE TONGUE EVERY 5 MINUTES AS NEEDED FOR CHEST PAIN. DO NOT EXCEED A TOTAL OF 3 DOSES IN 15 MINUTES. 25 tablet 2  . Omega-3 Fatty Acids (FISH OIL PO) Take 1 tablet by mouth daily.    [name] oxyCODONE-acetaminophen (PERCOCET) 10-325 MG per tablet Take 1 tablet by mouth 3 (three) times daily.     . sertraline (ZOLOFT) 100 MG tablet Take 100 mg by mouth daily.     No current facility-administered medications for this visit.    Socially,  he is divorced. He doesn't walk but not routinely exercise.  He continues to smoke one pack of cigarettes per day.  ROS General: Negative; No fevers, chills, or night sweats;  HEENT: Negative; No changes in vision or hearing, sinus congestion, difficulty swallowing Pulmonary:  Positive for shortness of breath; No cough, wheezing, , hemoptysis Cardiovascular: See history of present  illness GI: Negative; No nausea, vomiting, diarrhea, or abdominal pain GU: Negative; No dysuria, hematuria, or difficulty voiding Musculoskeletal: Negative; no myalgias, joint pain, or weakness Hematologic/Oncology: Negative; no easy bruising, bleeding Endocrine: Positive for diabetes no heat/cold intolerance;  Neuro: Negative; no changes in balance, headaches Skin: Negative; No rashes or skin lesions Psychiatric: Negative; No behavioral problems, depression Sleep: As it of her obstructive sleep apnea, CPAP initiated March 17, 2018. no bruxism, restless legs, hypnogognic hallucinations, no cataplexy Other comprehensive 14 point system review is negative.   PE BP 120/80   Pulse 69   Ht _0  (1.753 [name])   Wt 251 lb (113.9 kg)   SpO2 97%   BMI 37.07 kg/[name]    Repeat blood pressure by me 134/76  Wt Readings from Last 3 Encounters:  05/02/20 251 lb (113.9 kg)  01/22/20 245 lb (111.1 kg)  11/05/19 250 lb (113.4 kg)   General: Alert, oriented, no distress.  Skin: normal turgor, no rashes, warm and dry HEENT: Normocephalic, atraumatic. Pupils equal  round and reactive to light; sclera anicteric; extraocular muscles intact;  Nose without nasal septal hypertrophy Mouth/Parynx benign; Mallinpatti scale 3 Neck: Bandage right neck at the site of melanoma resection;no JVD, no carotid bruits; normal carotid upstroke Lungs: Decreased breath sounds without wheezing  Chest wall: without tenderness to palpitation Heart: PMI not displaced, RRR, s1 s2 normal, 1/6 systolic murmur, no diastolic murmur, no rubs, gallops, thrills, or heaves Abdomen: soft, nontender; no hepatosplenomehaly, BS+; abdominal aorta nontender and not dilated by palpation. Back: no CVA tenderness Pulses 2+ Musculoskeletal: full range of motion, normal strength, no joint deformities Extremities: no clubbing cyanosis or edema, Homan's sign negative  Neurologic: grossly nonfocal; Cranial nerves grossly wnl Psychologic: Normal mood and affect  ECG (independently read by me): Normal sinus rhythm at 69 bpm, bifascicular block with right bundle branch block and left anterior hemiblock.  On anteroseptal Q waves and inferior Q waves consistent with prior MIs.  Mild T wave abnormality laterally.  QTc interval 432 ms.     May 21, 2021ECG (independently read by me): Sinus bradycarrdia at 57; RBBB, Q V1-3 ands avF  July 24, 2019 ECG (independently read by me): Normal sinus rhythm at 80 bpm.  Right bundle branch block; previously noted Q waves V1 through V4 and 2 3 and F consistent with prior infarct  October 15, 2018 ECG (independently read by me): Normal sinus rhythm at 93 bpm with baseline artifact.  Right bundle branch block.  Anterior Q waves consistent with prior infarct.  Inferior Q waves.  ECG (independently read by me): Sinus rhythm at 65 bpm.  PAC.  Right bundle branch block, inferior Q waves in 3 and aVF, old anterior myocardial infarction.  Lateral T wave abnormality.  Normal intervals.  June 16, 2018 ECG (independently read by me): Normal sinus rhythm at 84 bpm.  Right  bundle branch block with repolarization changes.  Inferior Q waves consistent with prior infarct.  Anteroseptal Q waves consistent with prior anterior MI.  May 28, 2018 ECG (independently read by me): Normal sinus rhythm with sinus arrhythmia.  Right bundle branch block with repolarization changes.  Q waves consistent with anteroseptal MI and possible inferior MI no indication to adjust the pain upon his needs  July 17,2019 ECG (independently read by me): Normal sinus rhythm 89 bpm.  Right bundle branch block with repolarization changes.  Inferior Q waves and anterior  Q waves consistent with anterolateral infarct with possible inferior infarct  February 21, 2018 ECG (independently read by me): Normal sinus rhythm at 95 bpm, right bundle branch block with repolarization changes.  Anterolateral MI.  QTc interval 477 ms  Jan 24, 2018 ECG (independently read by me): Sinus rhythm at 97 bpm.  Right bundle branch block with repolarization changes.  QTc interval 472 ms.  Anterolateral Q waves  Jan 07, 2018 ECG (independently read by me): Normal sinus rhythm at 88 bpm.  Right bundle branch block with repolarization changes.  Inferior Q waves.  T wave anteroseptally, possible anteroseptal MI undetermined.  November 2017 ECG (independently read by me): Normal sinus rhythm at 64 bpm.  Branch block with repolarization changes.  QTc interval 460 ms.  June 2016 ECG (independently read by me): Sinus bradycardia at 57 bpm, right bundle branch block with repositioning changes.  QTc interval 459 ms.  September 2015 ECG (independently read by me and (: Normal sinus rhythm.  Right bundle branch block with repolarization changes.  Ventricular rate 81.  02/25/2014 ECG Normal sinus rhythm.  Right bundle branch block with repolarization changes.  PR interval 148 ms; QTc interval 463 ms  Prior ECG: sinus rhythm at 76 beats per minute; right bundle-branch block with repolarization changes. PR interval 142 ms, QTC 481  ms.   LABS:  BMP Latest Ref Rng & Units 11/05/2019 01/21/2019 10/13/2018  Glucose 70 - 99 mg/dL 104(H) 81 97  BUN 8 - 23 mg/dL 24(H) 26 37(H)  Creatinine 0.61 - 1.24 mg/dL 2.25(H) 1.81(H) 2.07(H)  BUN/Creat Ratio 10 - 24 - 14 18  Sodium 135 - 145 mmol/L 138 138 139  Potassium 3.5 - 5.1 mmol/L 4.8 4.7 5.4(H)  Chloride 98 - 111 mmol/L 105 103 104  CO2 22 - 32 mmol/L 24 21 19(L)  Calcium 8.9 - 10.3 mg/dL 9.3 9.8 9.7   Hepatic Function Latest Ref Rng & Units 11/05/2019 01/13/2018 08/20/2017  Total Protein 6.5 - 8.1 g/dL 6.5 6.6 6.9  Albumin 3.5 - 5.0 g/dL 3.9 4.2 3.7  AST 15 - 41 U/L _0 ALT 0 - 44 U/L 22 30 44  Alk Phosphatase 38 - 126 U/L 94 129(H) 106  Total Bilirubin 0.3 - 1.2 mg/dL 0.7 <0.2 0.4   CBC Latest Ref Rng & Units 11/05/2019 01/21/2019 09/18/2018  WBC 4.0 - 10.5 K/uL 7.2 8.1 8.7  Hemoglobin 13.0 - 17.0 g/dL 13.9 14.1 13.1  Hematocrit 39 - 52 % 44.3 40.5 42.1  Platelets 150 - 400 K/uL 156 182 179   Lab Results  Component Value Date   MCV 94.3 11/05/2019   MCV 85 01/21/2019   MCV 89.8 09/18/2018   Lab Results  Component Value Date   TSH 0.598 02/21/2018   Lipid Panel     Component Value Date/Time   CHOL 148 01/13/2018 0839   TRIG 265 (H) 01/13/2018 0839   HDL 36 (L) 01/13/2018 0839   CHOLHDL 4.1 01/13/2018 0839   CHOLHDL 4.8 08/09/2017 1117   VLDL 52 (H) 08/09/2017 1117   LDLCALC 59 01/13/2018 0839   INR result since initiation of Coumadin: 1:> 7.6;> >10;> 8.6 (Vit K administered) >4.3; no longer on Coumadin, now on Eliquis  RADIOLOGY: No results found.  IMPRESSION: 1. Coronary artery disease involving native coronary artery of native heart without angina pectoris   2. Chronic systolic heart failure ([place])   3. Ischemic cardiomyopathy   4. Essential hypertension   5. Chronic kidney disease (CKD), stage IV (severe) ([place])   6. Hyperlipidemia LDL goal <70   7.  Long term (current) use of anticoagulants   8. Depression, unspecified depression type   9.  Tobacco abuse     ASSESSMENT AND PLAN:  Mr. Colborn is a 74 year old Caucasian male who suffered an acute coronary syndrome on 08/04/2012 when he presented with subtotal occlusion of his RCA. He had diffuse disease beyond the subtotal occlusion and ultimately had successful insertion of a 3.25x32 mm Promus DES stent. He had concomitant CAD with 40-50% proximal LAD narrowing, 30% circumflex marginal stenoses. NMR lipoprofile after initiation of atorvastatin in January 2014 showed marked improvement in total cholesterol of 119 LDL 48 LDL particle #992. HDL was very low at 30 with an increased triglyceride at 203 and significantly reduced HDL particle #24.2. His insulin resistance score was elevated at 65.  He suffered an anterior STEMI in December 2018 and underwent successful stenting and several days later he required staged intervention to circumflex and marginal vessel. 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  Warfarin was initially prescribed but due to significant issues with compliance and overmedication the decision was made that he would be safer using Eliquis even though he is on this for apical thrombus rather than atrial fibrillation.   He was subsequently found to have significant obstructive sleep apnea but stopped therapy.  He is now using nocturnal oxygen.  His blood pressure today is stable on his multiple drug regimen consisting of amlodipine 5 mg, carvedilol 25 mg twice a day, losartan 50 mg daily.  He continues to be on Plavix in addition to Eliquis.  His most recent echo Doppler study was reviewed which showed EF at 35 to 40% without residual thrombus.  He has stage IV chronic kidney disease with creatinine 2.25 in March 2021.  He is no longer on furosemide.  Renal function will need to be followed closely and if further increased dose of losartan should be reduced or he be switched to nitrate/hydralazine.  He is diabetic on Metformin.  He continues to be on  atorvastatin 80 mg.  Bobby [name] been checking laboratory and target LDL is less than 70.  He continues to be on medication for depression with bupropion and sertraline.  Presently he will continue with current therapy.  With his need for chronic anticoagulation, if he remains stable I will DC clopidogrel and initiate low-dose aspirin at follow-up evaluation.  I discussed the need to completely discontinue tobacco.  I will see him in the office in 4 to 6 months for follow-up evaluation or sooner as needed.   Bobby Sine, Bobby [name], [facility]  05/08/2020 2:57 PM

## 2020-05-08 ENCOUNTER — Encounter: Payer: Self-pay | Admitting: Cardiovascular Disease

## 2020-06-28 ENCOUNTER — Other Ambulatory Visit: Payer: Self-pay | Admitting: Cardiovascular Disease

## 2020-07-22 ENCOUNTER — Other Ambulatory Visit: Payer: Self-pay | Admitting: Cardiovascular Disease

## 2020-07-29 ENCOUNTER — Other Ambulatory Visit: Payer: Self-pay | Admitting: Cardiovascular Disease

## 2020-10-03 ENCOUNTER — Other Ambulatory Visit: Payer: Self-pay | Admitting: Cardiovascular Disease

## 2020-10-31 ENCOUNTER — Ambulatory Visit: Payer: Medicare Other | Admitting: Cardiovascular Disease

## 2020-11-09 ENCOUNTER — Encounter: Payer: Self-pay | Admitting: Cardiology

## 2020-11-09 ENCOUNTER — Other Ambulatory Visit: Payer: Self-pay

## 2020-11-09 ENCOUNTER — Ambulatory Visit (INDEPENDENT_AMBULATORY_CARE_PROVIDER_SITE_OTHER): Payer: Medicare Other | Admitting: Cardiology

## 2020-11-09 VITALS — BP 142/78 | HR 69 | Ht 69.0 in | Wt 257.0 lb

## 2020-11-09 DIAGNOSIS — I2510 Atherosclerotic heart disease of native coronary artery without angina pectoris: Secondary | ICD-10-CM

## 2020-11-09 DIAGNOSIS — Z8673 Personal history of transient ischemic attack (TIA), and cerebral infarction without residual deficits: Secondary | ICD-10-CM | POA: Diagnosis not present

## 2020-11-09 DIAGNOSIS — N184 Chronic kidney disease, stage 4 (severe): Secondary | ICD-10-CM | POA: Insufficient documentation

## 2020-11-09 DIAGNOSIS — J449 Chronic obstructive pulmonary disease, unspecified: Secondary | ICD-10-CM | POA: Diagnosis not present

## 2020-11-09 DIAGNOSIS — N183 Chronic kidney disease, stage 3 unspecified: Secondary | ICD-10-CM

## 2020-11-09 MED ORDER — ASPIRIN EC 81 MG PO TBEC: 81.0000 mg | DELAYED_RELEASE_TABLET | Freq: Every day | ORAL | 3 refills | Status: DC

## 2020-11-09 NOTE — Progress Notes (Signed)
Cardiology Office Note:    Date:  11/09/2020   ID:  Bobby [name], DOB July 03, 1946, MRN [id number]  PCP:  Hayden Rasmussen, MD  Cardiologist:  Shelva Majestic, MD  Electrophysiologist:  None   Referring MD: Hayden Rasmussen, MD   No chief complaint on file.   History of Present Illness:    Bobby [name] is a 75 y.o. male with a hx of CAD.  He had a inferior STEMI in 2013 treated with an RCA DES.  In 2018 he had an anterior STEMI treated with an LAD DES.  His ejection fraction after that was depressed at 30 to 35%.  Echocardiogram showed apical akinesis with a questionable LV thrombus and he was started on anticoagulation.  He had some bleeding issues on warfarin and this was changed to Eliquis.  In addition to the above problems he has a history of hypertension, past TIA, chronic renal insufficiency stage IIIb, and sleep apnea, he is on O2 at night.  He is in the office today for routine follow-up.  He last saw Dr. Claiborne Billings in October 2021.  Since then he is done well from a cardiac standpoint though he did say he had COVID 5 weeks ago.  Fortunately he was vaccinated and boosted and did not require hospitalization.  He still has no taste and smell.  He has had some "congestion".  Dr. Darron Doom is placed him on Aldactone 25 mg a day.  He has no LE edema or orthopnea.  He has no chest pain.  Past Medical History:  Diagnosis Date  . CAD (coronary artery disease),residual non obstructive disease    a. 08/2012 Inferior STEMI: RCA 99% (3.5x32 Promus DES), nonobs LAD/LCX dzs;  b. 01/2013 MV: EF 66%, small inferoseptal artifact and possible small inferobasilar scar;  c. 03/2014 MV: fixed inf defect->diaph attenuation, no ischemia. 12/18 PCI/DES to mLAD, staged PCI/DES Lcx/OM  . DM (diabetes mellitus), type 2 new diagnosis 08/05/2012   diet control  . ED (erectile dysfunction)    surgery planned  . History of echocardiogram    a. 08/2012 Echo: EF 55-60%, possible mild basal inferior HK, mildly dil RA, PASP  24mHg.  .Marland KitchenHyperlipidemia 08/05/2012  . Hypertensive heart disease   . [place] [place]   acute inferior stemi secondary to RCA occlusion; PCI  . Panic attack   . Pulmonary HTN ([facility]    echo 08/05/12, EF 55-60%, PA pressure 475m . S/P coronary artery stent placement, to RCA Promus DES 08/05/2012  . [facility]   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  . Tobacco abuse     Past Surgical History:  Procedure Laterality Date  . CARDIAC CATHETERIZATION  08/04/12   PCI to RCA with DES  . CORONARY STENT INTERVENTION N/A 08/12/2017   Procedure: CORONARY STENT INTERVENTION;  Surgeon: JoMartiniquePeter M, MD;  [facility];  Service: Cardiovascular;  Laterality: N/A;  . CORONARY/GRAFT ACUTE MI REVASCULARIZATION N/A 08/09/2017   Procedure: Coronary/Graft Acute MI Revascularization;  Surgeon: SmBelva CromeMD;  [facility];  Service: Cardiovascular;  Laterality: N/A;  . LEFT HEART CATH AND CORONARY ANGIOGRAPHY N/A 08/09/2017   Procedure: LEFT HEART CATH AND CORONARY ANGIOGRAPHY;  Surgeon: SmBelva CromeMD;  [facility];  Service: Cardiovascular;  Laterality: N/A;  . LEFT HEART CATHETERIZATION WITH CORONARY ANGIOGRAM N/A 08/04/2012   Procedure: LEFT HEART CATHETERIZATION WITH CORONARY ANGIOGRAM;  Surgeon: ThTroy SineMD;  [facility];  Service: Cardiovascular;  Laterality: N/A;  . LEG SURGERY Left    rod placed for fracture repair  . PENILE PROSTHESIS IMPLANT N/A 06/03/2014   Procedure: IMPLANT PENILE PROTHESIS INFLATABLE;  Surgeon: Ailene Rud, MD;  Location: [facility];  Service: Urology;  Laterality: N/A;  with penile block--0.5% marcaine plain  . PERCUTANEOUS CORONARY STENT INTERVENTION (PCI-S) Right 08/04/2012   Procedure: PERCUTANEOUS CORONARY STENT INTERVENTION (PCI-S);  Surgeon: Troy Sine, MD;  Location: [facility] CATH LAB;  Service: Cardiovascular;  Laterality: Right;  . THUMB ARTHROSCOPY Left    thumb joint  replaced    Current Medications: Current Meds  Medication Sig  . albuterol (PROVENTIL HFA;VENTOLIN HFA) 108 (90 Base) MCG/ACT inhaler Inhale 2 puffs into the lungs every 6 (six) hours as needed for wheezing or shortness of breath.  [name] albuterol (PROVENTIL) (2.5 MG/3ML) 0.083% nebulizer solution Take 5 mg by nebulization every 4 (four) hours as needed. For wheezing or SOB  . amLODipine (NORVASC) 5 MG tablet TAKE ONE TABLET BY MOUTH DAILY  . apixaban (ELIQUIS) 5 MG TABS tablet Take 1 tablet (5 mg total) by mouth 2 (two) times daily.  [name] atorvastatin (LIPITOR) 80 MG tablet Take 1 tablet (80 mg total) by mouth daily at 6 PM.  . benzonatate (TESSALON) 100 MG capsule Take 100-200 mg by mouth 3 (three) times daily as needed.  [name] BREZTRI AEROSPHERE 160-9-4.8 MCG/ACT AERO Inhale 2 puffs into the lungs 2 (two) times daily.  [name] buPROPion (WELLBUTRIN XL) 300 MG 24 hr tablet Take 300 mg by mouth daily.  . carvedilol (COREG) 25 MG tablet TAKE ONE TABLET BY MOUTH TWICE DAILY  . clonazePAM (KLONOPIN) 0.5 MG tablet Take 1 mg by mouth 2 (two) times daily.   . clopidogrel (PLAVIX) 75 MG tablet TAKE ONE TABLET BY MOUTH DAILY  . Coenzyme Q10 (COQ-10) 100 MG CAPS Take 1 capsule by mouth daily.   [name] COLCRYS 0.6 MG tablet Take 0.6 mg by mouth daily.  [name] losartan (COZAAR) 50 MG tablet TAKE ONE TABLET BY MOUTH DAILY  . metFORMIN (GLUCOPHAGE-XR) 500 MG 24 hr tablet Take 500 mg by mouth daily.  . nitroGLYCERIN (NITROSTAT) 0.4 MG SL tablet DISSOLVE 1 TABLET UNDER THE TONGUE EVERY 5 MINUTES AS NEEDED FOR CHEST PAIN. DO NOT EXCEED A TOTAL OF 3 DOSES IN 15 MINUTES.  . Omega-3 Fatty Acids (FISH OIL PO) Take 1 tablet by mouth daily.  [name] oxyCODONE-acetaminophen (PERCOCET) 10-325 MG per tablet Take 1 tablet by mouth 3 (three) times daily.  . predniSONE (DELTASONE) 10 MG tablet Take 10 mg by mouth daily.  . sertraline (ZOLOFT) 100 MG tablet Take 100 mg by mouth daily.  [name] spironolactone (ALDACTONE) 25 MG tablet Take 25 mg by mouth  daily.     Allergies:   Hydrocodone   Social History   Socioeconomic History  . Marital status: Legally Separated    Spouse name: Not on file  . Number of children: Not on file  . Years of education: Not on file  . Highest education level: Not on file  Occupational History  . Not on file  Tobacco Use  . Smoking status: Current Every Day Smoker    Packs/day: 1.00    Years: 56.00    Pack years: 56.00    Types: Cigarettes  . Smokeless tobacco: Never Used  . Tobacco comment: smoking now 1 ppd  Substance and Sexual Activity  . Alcohol use: Not Currently    Comment: ocasionally  . Drug use: No  .  Sexual activity: Not on file  Other Topics Concern  . Not on file  Social History Narrative  . Not on file   Social Determinants of Health   Financial Resource Strain: Not on file  Food Insecurity: Not on file  Transportation Needs: Not on file  Physical Activity: Not on file  Stress: Not on file  Social Connections: Not on file     Family History: The patient's family history includes Heart attack in his paternal grandmother; Heart disease in his maternal grandmother; Kidney disease in his father; Stomach cancer in his maternal grandfather.  ROS:   Please see the history of present illness.     All other systems reviewed and are negative.  EKGs/Labs/Other Studies Reviewed:    The following studies were reviewed today: Echo 02/15/2020- IMPRESSIONS    1. Left ventricular ejection fraction, by estimation, is 35 to 40%. The  left ventricle has moderately decreased function. The left ventricle  demonstrates regional wall motion abnormalities (see scoring  diagram/findings for description). The left  ventricular internal cavity size was mildly to moderately dilated. There  is mild left ventricular hypertrophy of the basal-septal segment. Left  ventricular diastolic parameters are consistent with Grade I diastolic  dysfunction (impaired relaxation).  Elevated left  ventricular end-diastolic pressure. There is akinesis of the  left ventricular, apical septal wall, inferior wall and anterior wall.  There is akinesis of the left ventricular, entire apical segment. There is  akinesis of the left ventricular,  basal inferior wall. There is akineiss of the mid anteroseptal wall. 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  2. Right ventricular systolic function is normal. The right ventricular  size is normal.  3. Left atrial size was mildly dilated.  4. The mitral valve is normal in structure. Trivial mitral valve  regurgitation. No evidence of mitral stenosis.  5. The aortic valve is tricuspid. Aortic valve regurgitation is not  visualized. Mild to moderate aortic valve sclerosis/calcification is  present, without any evidence of aortic stenosis.  6. The inferior vena cava is normal in size with greater than 50%  respiratory variability, suggesting right atrial pressure of 3 mmHg.   EKG:  EKG is not ordered today.  The ekg ordered 05/02/2020 demonstrates NSR-HR 70, RBBB, LAFB  Recent Labs: No results found for requested labs within last 8760 hours.  Recent Lipid Panel    Component Value Date/Time   CHOL 148 01/13/2018 0839   TRIG 265 (H) 01/13/2018 0839   HDL 36 (L) 01/13/2018 0839   CHOLHDL 4.1 01/13/2018 0839   CHOLHDL 4.8 08/09/2017 1117   VLDL 52 (H) 08/09/2017 1117   LDLCALC 59 01/13/2018 0839    Physical Exam:    VS:  BP (!) 142/78   Pulse 69   Ht '5\' 9"'$  (1.753 m)   Wt 257 lb (116.6 kg)   SpO2 95%   BMI 37.95 kg/m     Wt Readings from Last 3 Encounters:  11/09/20 257 lb (116.6 kg)  05/02/20 251 lb (113.9 kg)  01/22/20 245 lb (111.1 kg)     GEN: Overweight male, well developed in no acute distress HEENT: Normal NECK: No JVD; No carotid bruits CARDIAC: RRR, no murmurs, rubs, gallops RESPIRATORY:  Basilar rhonchi  ABDOMEN: Obese, Soft, non-tender, non-distended MUSCULOSKELETAL:  No edema; No  deformity  SKIN: Warm and dry NEUROLOGIC:  Alert and oriented x 3 PSYCHIATRIC:  Normal affect   ASSESSMENT:    CAD- RCA PCI 2013, LAD PCI  2018  ICM- EF 35-40%  AC- ? LVT on echo 2018 with apical AK- not seen on June 2021 echo  OSA- ON O2 HS  COVID- H/O COVID last month.  Fortunately he had been vaccinated and he was able to avoid hospitalization. He still has loss of smell, congestion, and fatigue. Aldactone added by PCP  CRI3b- Followed by Dr Darron Doom.   PLAN:    Per Dr Evette Georges recommendation- stop Plavix, start ASA 81 mg.  F/U Dr Claiborne Billings in 6 months.   Medication Adjustments/Labs and Tests Ordered: Current medicines are reviewed at length with the patient today.  Concerns regarding medicines are outlined above.  No orders of the defined types were placed in this encounter.  No orders of the defined types were placed in this encounter.   There are no Patient Instructions on file for this visit.   Signed, Kerin Ransom, PA-C  11/09/2020 2:58 PM    [facility]

## 2020-11-09 NOTE — Patient Instructions (Addendum)
Medication Instructions:  Stop Plavix. Start Aspirin 81 mg ( 1 Daily) *If you need a refill on your cardiac medications before your next appointment, please call your pharmacy*   Lab Work: No Labs If you have labs (blood work) drawn today and your tests are completely normal, you will receive your results only by: [name] MyChart Message (if you have MyChart) OR . A paper copy in the mail If you have any lab test that is abnormal or we need to change your treatment, we will call you to review the results.   Testing/Procedures: No Testing   Follow-Up: At [facility], you and your health needs are our priority.  As part of our continuing mission to provide you with exceptional heart care, we have created designated Provider Care Teams.  These Care Teams include your primary Cardiologist (physician) and Advanced Practice Providers (APPs -  Physician Assistants and Nurse Practitioners) who all work together to provide you with the care you need, when you need it.  Your next appointment:   6 month(s)  The format for your next appointment:   In Person  Provider:   Shelva Majestic, MD

## 2021-03-02 ENCOUNTER — Other Ambulatory Visit: Payer: Self-pay | Admitting: Cardiovascular Disease

## 2021-03-08 ENCOUNTER — Telehealth: Payer: Self-pay | Admitting: Cardiovascular Disease

## 2021-03-08 NOTE — Telephone Encounter (Signed)
Pts sister calling to confirm refill went to preferred pharmacy. Also reviewed upcoming appointment on 9/8.

## 2021-03-08 NOTE — Telephone Encounter (Signed)
Pt c/o medication issue: 1. Name of Medication: Losartan  2. How are you currently taking this medication (dosage and times per day)? No  3. Are you having a reaction (difficulty breathing--STAT)?  No  4. What is your medication issue? Patient sister calling because she received a notification concering this medication.

## 2021-05-11 ENCOUNTER — Ambulatory Visit (INDEPENDENT_AMBULATORY_CARE_PROVIDER_SITE_OTHER): Payer: Medicare Other | Admitting: Cardiovascular Disease

## 2021-05-11 ENCOUNTER — Encounter: Payer: Self-pay | Admitting: Cardiovascular Disease

## 2021-05-11 ENCOUNTER — Other Ambulatory Visit: Payer: Self-pay

## 2021-05-11 DIAGNOSIS — I5042 Chronic combined systolic (congestive) and diastolic (congestive) heart failure: Secondary | ICD-10-CM | POA: Diagnosis not present

## 2021-05-11 DIAGNOSIS — I4510 Unspecified right bundle-branch block: Secondary | ICD-10-CM

## 2021-05-11 DIAGNOSIS — I25118 Atherosclerotic heart disease of native coronary artery with other forms of angina pectoris: Secondary | ICD-10-CM

## 2021-05-11 DIAGNOSIS — E785 Hyperlipidemia, unspecified: Secondary | ICD-10-CM | POA: Diagnosis not present

## 2021-05-11 DIAGNOSIS — I255 Ischemic cardiomyopathy: Secondary | ICD-10-CM

## 2021-05-11 DIAGNOSIS — Z7901 Long term (current) use of anticoagulants: Secondary | ICD-10-CM

## 2021-05-11 DIAGNOSIS — I10 Essential (primary) hypertension: Secondary | ICD-10-CM

## 2021-05-11 DIAGNOSIS — N1832 Chronic kidney disease, stage 3b: Secondary | ICD-10-CM

## 2021-05-11 DIAGNOSIS — G4733 Obstructive sleep apnea (adult) (pediatric): Secondary | ICD-10-CM

## 2021-05-11 NOTE — Patient Instructions (Signed)
Medication Instructions:  The current medical regimen is effective;  continue present plan and medications.  *If you need a refill on your cardiac medications before your next appointment, please call your pharmacy*   Lab Work: Fasting lab work (CBC, CMET, TSH, LIPID) (come back fasting, nothing to eat or drink, no lab appointment needed)  If you have labs (blood work) drawn today and your tests are completely normal, you will receive your results only by: MyChart Message (if you have MyChart) OR A paper copy in the mail If you have any lab test that is abnormal or we need to change your treatment, we will call you to review the results.   Testing/Procedures: Echocardiogram - Your physician has requested that you have an echocardiogram. Echocardiography is a painless test that uses sound waves to create images of your heart. It provides your doctor with information about the size and shape of your heart and how well your heart's chambers and valves are working. This procedure takes approximately one hour. There are no restrictions for this procedure. This will be performed at our [facility] location - [address].    Follow-Up: At [facility], you and your health needs are our priority.  As part of our continuing mission to provide you with exceptional heart care, we have created designated Provider Care Teams.  These Care Teams include your primary Cardiologist (physician) and Advanced Practice Providers (APPs -  Physician Assistants and Nurse Practitioners) who all work together to provide you with the care you need, when you need it.  We recommend signing up for the patient portal called "MyChart".  Sign up information is provided on this After Visit Summary.  MyChart is used to connect with patients for Virtual Visits (Telemedicine).  Patients are able to view lab/test results, encounter notes, upcoming appointments, etc.  Non-urgent messages can be sent to your provider  as well.   To learn more about what you can do with MyChart, go to [email or web address].    Your next appointment:   2 month(s)  The format for your next appointment:   In Person  Provider:   Shelva Majestic, MD

## 2021-05-11 NOTE — Progress Notes (Signed)
[name], male   DOB: Sep 30, 1945, 75 y.o.   MRN: [id number]    HPI: Bobby [name] is a 75 y.o. male presents to Bobby office today for a 12 month followup evaluation.   Bobby [name]  has a long-standing tobacco history having started smoking at age 72, a history of hypertension, remote small TIA/CVA who presented to [facility] hospital on 08/04/2012 in Bobby setting of inferior ST segment elevation myocardial infarction. Catheterization by me revealed a 99% stenosis of Bobby RCA and concomitant CAD involving his LAD and circumflex vessels. He underwent acute percutaneous coronary intervention with an excellent door to balloon time of only 20 minutes and ultimately insertion of a 3.25x32 mm DES stent post dilated 3.3. An echo done in [facility] hospital showed an EF of 50-60% with mild basal inferior probable scar and moderate pulmonary hypertension with PA pressure of 45 mm.  When I saw Bobby [name] in May 2014, he did note shortness of breath with activity. He denied definitive chest pain. At that time, he realized that he had inadvertentlystopped taking Bobby atorvastatin 40 mg dose and this was resumed. In addition, his Toprol dose was increased from 50 to 75 mg daily. A nuclear study showed an ejection fraction of 66%. Perfusion was essentially normal with exception of a small region of fixed inferoseptal bowel artifact and possible small area of inferobasilar scar.   Bobby [name] unfortunately continues to smoke at least a pack of cigarettes per day.   He does experience shortness of breath with activity.  He denies recent chest pain.  He has chronic right bundle branch block with repolarization changes.  He is diabetic and has pulmonary hypertension.  A follow-up nuclear perfusion study on 03/26/2014 remianed low risk and showed normal LV function and normal wall motion.  There was a fixed inferior defect that was felt most consistent with diaphragmatic attenuation.  There was no evidence for ischemia.  He  underwent prostate surgery by Dr. Gaynelle [name] without cardiovascular compromise.    A screening abdominal aortic ultrasound revealed a normal abdominal aorta.  Since I saw him in November 2017, he had an extensive history and has had recurrent hospitalizations he was hospitalized in October 2018 for left arm pain.  Troponin was negative.  In December 2018 he suffered an anterior ST segment elevation MI due to acute occlusion of his mid LAD and underwent successful PCI of his LAD with insertion of a 2.5 x 22 mm Resolute Onyx DES stent.  He was also found to have 99% dominant mid left circumflex lesion with 80% stenosis in Bobby OM1 vessel and underwent staged intervention to Bobby mid circumflex and marginal vessels.  Troponin was greater than 65.  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  There was mention of sludge is a sign of pre-thrombotic state but no definite thrombus was seen at Bobby apex.  He returned to Bobby hospital August 20, 2017 with malaise and dyspnea.  He was bradycardic.  He was diuresed.  He has renal insufficiency with creatinines increasing up to 1.9.  He has been followed by Bobby [name] on numerous occasions in Bobby office.  Unfortunately continues to smoke cigarettes.  He admits to 1/2 pack/day.  He does note occasional dizziness as well as some shortness of breath.  He denies recurrent anginal type symptoms.   Suggestive of low flow state.  When I saw him on Jan 07, 2018  I recommended that he undergo a follow-up echo Doppler study to reassess systolic and diastolic function as well as potential for apical thrombus formation.  He had continued to be on aspirin and Brilinta following his STEMI.  He was on atorvastatin 80 mg for hyperlipidemia with target less than 70.  I had a long discussion regarding smoking cessation.  He underwent a follow-up echo Doppler study on Jan 20, 2018.  This showed reduced LV function with an EF  of 35 to 40% with akinesis of Bobby mid apical anterolateral and septal as well as apical walls.  There was grade 1 diastolic dysfunction.  There was now a possible small layered thrombus at Bobby apex noted using Definity and also evidence of swirling at Bobby LV apex suggestive of low flow state.   As result of Bobby echo findings, I saw him in follow-up on Jan 24, 2018 at which time I had a very long discussion with him guarding potential thromboembolic stroke risk.  He was started on warfarin anticoagulation.  Apparently, Bobby [name] medications have been in a  pack and had been followed by his girlfriend.  He was taking his medications correctly resulting in significant over anticoagulation with INR reached greater than 10.  He has been followed very closely by our pharmacy department and he also was given vitamin K.  Due to his poor medication compliance after much discussion ultimately Bobby thought is for him to transition to Eliquis once his INR gets below 2.  He also has been taking his blood pressure medicines incorrectly.  [name]  In Bobby past I had recommended he undergo a sleep evaluation for sleep apnea which he never followed up with it was a no-show in Bobby sleep lab.  He was worked into my schedule on February 21, 2018 and was seen as an add-on due to his medication issues.  He was here now with his sister who in Bobby past had done an excellent job in caring for his medications and she is committed now to resume doing this for him.  During his evaluation, he was exceptionally somnolent, and a significant time was spent with medication adjustment.  Due to his inability to take effect of warfarin Bobby decision was made to use Eliquis for anticoagulation with more consistent dosing.  His recent laboratory had shown stage IV chronic kidney disease and it was recommended that he hold his lisinopril for several days and then reduce Bobby dose to 5 mg as well as holding his  furosemide with subsequent reduction of dose with reinstitution at 20 mg.  I scheduled him for an expeditious sleep study due to concerns for significant sleep apnea and he was seen5 days later by Racquel with medication adjustment and anticoagulation issues.  He underwent a sleep study on February 25, 2018 and he was found to have moderate overall sleep apnea with an AHI of 22.2 and RDI of 25.4.  He could not achieve any rem sleep and Bobby overall severity may very well be underestimated.  He was titrated up to 10 cm water pressure.  An initial trial of CPAP auto with an EPR range of 8-15 was recommended.  When I last saw him on March 19, 2018 he had he had just initiated CPAP Bobby evening before noted significant improvement in his sleep. His renal function had significantly improved from a creatinine of 2.7 down to 1.362 weeks ago.  He is unaware of any  recurrent episodes of arrhythmia.  Apparently his blood pressure had become elevated and Dr. Darron Doom added losartan to his medical regimen.  Of note, he also already was on lisinopril 2.5 mg, furosemide 20 mg, carvedilol 6.25 mg twice a day.  He has been only intermittently using CPAP due to complaints of Bobby mask on his nose bridge.  As result, he was recently changed to a different mask.  A download was obtained in Bobby office today in which he does not meet compliance.  [employer] is his DME company.  His set up date was March 17, 2018 and he has until June 17, 2018 to demonstrate compliance.    When I saw him in September 2019 his blood pressure was elevated.  At that time apparently he was on both Bobby previous lisinopril and Bobby recent losartan that was started by Dr. Darron Doom.  I suggested he discontinue lisinopril and further titrated losartan to 25 mg twice a day both for improved blood pressure and for his reduced LV function.  He was not compliant with reference to his CPAP therapy and I had a long discussion with him regarding Bobby importance that he meet  compliance by October 15.  I saw him in October 2019.  At that time he was not using his CPAP therapy.  I reviewed his blood pressure recordings and they seem to be consistently elevated.  He continues to be on Eliquis and was taking Lasix 20 mg.  That evaluation I had a long discussion with him guarding his noncompliance with CPAP.  I discussed alternatives such as customized oral appliance.  We discussed with his LV dysfunction he may be a candidate for Jardiance if his renal function is stable.  Was maintaining sinus rhythm.  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  His ECG was unchanged.  Troponins were negative.  He was discharged from Bobby ER and seen by [name], [facility] on October 01, 2018.  He had not had any further chest pain since he left Bobby hospital and no ischemic work-up was planned.  His renal function had worsened and his furosemide was changed to every other day.  Visibly he feels well and has not had recurrent chest pain.  He uses oxygen at 2 L nasal cannula at nighttime instead of his CPAP therapy.  Laboratory 2 days prior to his office visit has shown a creatinine of 2.07 with a BUN of 37.  Potassium was 5.4.   I last saw him in February 2020.  At that time his blood pressure was stable and he was taken furosemide 20 mg every other day, carvedilol 18.75 mg twice a day, and valsartan 40 mg.  Renal function improved with discontinuance of furosemide.  I saw him in November 2020 when he came to Bobby office with his sister. He was on supplemental oxygen at nighttime but has not been using CPAP.  Upon further questioning he had difficulty with Bobby mask on Bobby bridge of his nose.  Recently, his blood pressure has been elevated and he has been of seen by Dr. Darron Doom.  He apparently was now on losartan 50 mg in place of valsartan, carvedilol 18.75 mg twice a  day, and he is on Plavix in addition to apixaban 5 mg twice a day.  He continued to be on atorvastatin 80 mg daily for  hyperlipidemia.  He is diabetic on metformin.  He is on bupropion 300 mg for depression.  During that evaluation, his blood pressure was elevated at 162/90 I recommended further titration of carvedilol to 25 mg twice a day and added amlodipine 5 mg.  I again discussed Bobby importance of using CPAP therapy.  He had stage III chronic kidney disease with creatinine improving from 2.07 to 1.77.  I saw him in May 2021 at which time any chest pain.He was treated with an infection by Dr. Darron Doom and at times has had some recurrent congestion.  He is breathing better.  He uses supplemental oxygen but admits that at times he does not use it every night.  Unfortunately he still smoking a pack of cigarettes lasting 2 to 2-1/2 days.  Laboratory on November 05, 2019 showed a creatinine at 2.25.  He has recently been on [employer] in addition to albuterol for wheezing.  However he had was not consistently using his combination drug treatment.    I last saw him in May 02, 2020.  Over Bobby prior months he continued to be stable from a cardiac standpoint.   He underwent a follow-up echo Doppler study on February 15, 2020 which showed an EF of 35 to 40%.  There was mild LVH of Bobby basal septal segment.  There was grade 1 diastolic dysfunction.  He had previously noted akinesis of Bobby apex and basal inferior wall and mid apical anteroseptal wall.  There was no evidence for residual thrombus.  Last week he under went right neck melanoma resection by Dr. Martin Majestic.  He has further decreased his tobacco use.    Since I last saw him, he was evaluated by Kerin Ransom in March 2022.  Recently, he has remained relatively stable.  He admits to a rare episode of chest discomfort which occurs perhaps every 1-1/2 months.  He continues to smoke cigarettes and smokes a carton of cigarettes over a month..  He believes he is  sleeping adequately.  He does admit to shortness of breath.  Of note, he had undergone laboratory by his primary physician, Dr. Darron Doom on July 08, 2020.  Creatinine was increased at 1.94.  He is unaware of palpitations, presyncope or syncope.  He presents for evaluation.  Past Medical History:  Diagnosis Date   CAD (coronary artery disease),residual non obstructive disease    a. 08/2012 Inferior STEMI: RCA 99% (3.5x32 Promus DES), nonobs LAD/LCX dzs;  b. 01/2013 MV: EF 66%, small inferoseptal artifact and possible small inferobasilar scar;  c. 03/2014 MV: fixed inf defect->diaph attenuation, no ischemia. 12/18 PCI/DES to mLAD, staged PCI/DES Lcx/OM   DM (diabetes mellitus), type 2 new diagnosis 08/05/2012   diet control   ED (erectile dysfunction)    surgery planned   History of echocardiogram    a. 08/2012 Echo: EF 55-60%, possible mild basal inferior HK, mildly dil RA, PASP 48mHg.   Hyperlipidemia 08/05/2012   Hypertensive heart disease    [facility] [place]   acute inferior stemi secondary to RCA occlusion; PCI   Panic attack    Pulmonary HTN ([facility]    echo 08/05/12, EF 55-60%, PA pressure 424m  S/P coronary artery stent placement, to RCA Promus DES 08/05/2012   [facility]   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   Tobacco abuse     Past Surgical History:  Procedure Laterality Date   CARDIAC CATHETERIZATION  08/04/12   PCI to RCA with DES   CORONARY STENT INTERVENTION  N/A 08/12/2017   Procedure: CORONARY STENT INTERVENTION;  Surgeon: Martinique, Peter M, MD;  Location: [place] [facility];  Service: Cardiovascular;  Laterality: N/A;   CORONARY/GRAFT ACUTE MI REVASCULARIZATION N/A 08/09/2017   Procedure: Coronary/Graft Acute MI Revascularization;  Surgeon: Belva Crome, MD;  Location: [place] CV LAB;  Service: Cardiovascular;  Laterality: N/A;   LEFT HEART CATH AND CORONARY ANGIOGRAPHY N/A 08/09/2017   Procedure: LEFT HEART CATH AND CORONARY ANGIOGRAPHY;  Surgeon:  Belva Crome, MD;  Location: [place] [facility] LAB;  Service: Cardiovascular;  Laterality: N/A;   LEFT HEART CATHETERIZATION WITH CORONARY ANGIOGRAM N/A 08/04/2012   Procedure: LEFT HEART CATHETERIZATION WITH CORONARY ANGIOGRAM;  Surgeon: Troy Sine, MD;  Location[facility] C[facility] LAB;  Service: Cardiovascular;  Laterality: N/A;   LEG SURGERY Left    rod placed for fracture repair   PENILE PROSTHESIS IMPLANT N/A 06/03/2014   Procedure: IMPLANT PENILE PROTHESIS INFLATABLE;  Surgeon: Ailene Rud, MD;  Location: [facility];  Service: Urology;  Laterality: N/A;  with penile block--0.5% marcaine plain   PERCUTANEOUS CORONARY STENT INTERVENTION (PCI-S) Right 08/04/2012   Procedure: PERCUTANEOUS CORONARY STENT INTERVENTION (PCI-S);  Surgeon: Troy Sine, MD;  Location: [facility] CATH LAB;  Service: Cardiovascular;  Laterality: Right;   THUMB ARTHROSCOPY Left    thumb joint replaced    Allergies  Allergen Reactions   Hydrocodone Itching    Current Outpatient Medications  Medication Sig Dispense Refill   albuterol (PROVENTIL HFA;VENTOLIN HFA) 108 (90 Base) MCG/ACT inhaler Inhale 2 puffs into Bobby lungs every 6 (six) hours as needed for wheezing or shortness of breath. 1 Inhaler 2   albuterol (PROVENTIL) (2.5 MG/3ML) 0.083% nebulizer solution Take 5 mg by nebulization every 4 (four) hours as needed. For wheezing or SOB     amLODipine (NORVASC) 5 MG tablet TAKE ONE TABLET BY MOUTH DAILY 180 tablet 1   apixaban (ELIQUIS) 5 MG TABS tablet Take 1 tablet (5 mg total) by mouth 2 (two) times daily. 28 tablet 1   aspirin EC 81 MG tablet Take 1 tablet (81 mg total) by mouth daily. Swallow whole. 90 tablet 3   atorvastatin (LIPITOR) 80 MG tablet Take 1 tablet (80 mg total) by mouth daily at 6 PM. 90 tablet 3   benzonatate (TESSALON) 100 MG capsule Take 100-200 mg by mouth 3 (three) times daily as needed.     BREZTRI AEROSPHERE 160-9-4.8 MCG/ACT AERO Inhale 2 puffs into Bobby lungs 2 (two) times daily.     buPROPion  (WELLBUTRIN XL) 300 MG 24 hr tablet Take 300 mg by mouth daily.     carvedilol (COREG) 25 MG tablet TAKE ONE TABLET BY MOUTH TWICE DAILY 180 tablet 3   clonazePAM (KLONOPIN) 0.5 MG tablet Take 1 mg by mouth 2 (two) times daily.   0   Coenzyme Q10 (COQ-10) 100 MG CAPS Take 1 capsule by mouth daily.      COLCRYS 0.6 MG tablet Take 0.6 mg by mouth daily.  3   losartan (COZAAR) 50 MG tablet TAKE ONE TABLET BY MOUTH DAILY 90 tablet 2   metFORMIN (GLUCOPHAGE-XR) 500 MG 24 hr tablet Take 500 mg by mouth daily.  3   nitroGLYCERIN (NITROSTAT) 0.4 MG SL tablet DISSOLVE 1 TABLET UNDER Bobby TONGUE EVERY 5 MINUTES AS NEEDED FOR CHEST PAIN. DO NOT EXCEED A TOTAL OF 3 DOSES IN 15 MINUTES. 90 tablet 3   Omega-3 Fatty Acids (FISH OIL PO) Take 1 tablet by mouth daily.  oxyCODONE-acetaminophen (PERCOCET) 10-325 MG per tablet Take 1 tablet by mouth 3 (three) times daily.     predniSONE (DELTASONE) 10 MG tablet Take 10 mg by mouth daily.     sertraline (ZOLOFT) 100 MG tablet Take 100 mg by mouth daily.     spironolactone (ALDACTONE) 25 MG tablet Take 25 mg by mouth daily.     No current facility-administered medications for this visit.    Socially,  he is divorced. He doesn't walk but not routinely exercise.  He continues to smoke one pack of cigarettes per day.  ROS General: Negative; No fevers, chills, or night sweats;  HEENT: Negative; No changes in vision or hearing, sinus congestion, difficulty swallowing Pulmonary:  Positive for shortness of breath; No cough, wheezing, , hemoptysis Cardiovascular: See history of present illness GI: Negative; No nausea, vomiting, diarrhea, or abdominal pain GU: Negative; No dysuria, hematuria, or difficulty voiding Musculoskeletal: Negative; no myalgias, joint pain, or weakness Hematologic/Oncology: Negative; no easy bruising, bleeding Endocrine: Positive for diabetes no heat/cold intolerance;  Neuro: Negative; no changes in balance, headaches Skin: Negative; No  rashes or skin lesions Psychiatric: Negative; No behavioral problems, depression Sleep: As it of her obstructive sleep apnea, CPAP initiated March 17, 2018. no bruxism, restless legs, hypnogognic hallucinations, no cataplexy Other comprehensive 14 point system review is negative.   PE BP 120/70   Pulse 76   Ht _0  (1.753 m)   Wt 256 lb 6.4 oz (116.3 kg)   SpO2 92%   BMI 37.86 kg/m    Repeat blood pressure by me was 126/7  Wt Readings from Last 3 Encounters:  05/11/21 256 lb 6.4 oz (116.3 kg)  11/09/20 257 lb (116.6 kg)  05/02/20 251 lb (113.9 kg)   General: Alert, oriented, no distress.  Skin: normal turgor, no rashes, warm and dry HEENT: Normocephalic, atraumatic. Pupils equal round and reactive to light; sclera anicteric; extraocular muscles intact;  Nose without nasal septal hypertrophy Mouth/Parynx benign; Mallinpatti scale 3 Neck: Thick neck; no JVD, no carotid bruits; normal carotid upstroke Lungs: clear to ausculatation and percussion; no wheezing or rales Chest wall: without tenderness to palpitation Heart: PMI not displaced, RRR, s1 s2 normal, 1/6 systolic murmur, no diastolic murmur, no rubs, gallops, thrills, or heaves Abdomen: Central adiposity; soft, nontender; no hepatosplenomehaly, BS+; abdominal aorta nontender and not dilated by palpation. Back: no CVA tenderness Pulses 2+ Musculoskeletal: full range of motion, normal strength, no joint deformities Extremities: no clubbing cyanosis or edema, Homan's sign negative  Neurologic: grossly nonfocal; Cranial nerves grossly wnl Psychologic: Normal mood and affect  May 11, 2021 ECG (independently read by me): Normal sinus rhythm at 76 bpm, right bundle branch block, left anterior hemiblock.  No ectopy.  Anteroseptal Q waves consistent with prior anterior MI  May 02, 2020 ECG (independently read by me): Normal sinus rhythm at 69 bpm, bifascicular block with right bundle branch block and left anterior  hemiblock.  On anteroseptal Q waves and inferior Q waves consistent with prior MIs.  Mild T wave abnormality laterally.  QTc interval 432 ms.     May 21, 2021ECG (independently read by me): Sinus bradycarrdia at 57; RBBB, Q V1-3 ands avF  July 24, 2019 ECG (independently read by me): Normal sinus rhythm at 80 bpm.  Right bundle branch block; previously noted Q waves V1 through V4 and 2 3 and F consistent with prior infarct  October 15, 2018 ECG (independently read by me): Normal sinus rhythm at 93 bpm with baseline artifact.  Right bundle branch block.  Anterior Q waves consistent with prior infarct.  Inferior Q waves.  ECG (independently read by me): Sinus rhythm at 65 bpm.  PAC.  Right bundle branch block, inferior Q waves in 3 and aVF, old anterior myocardial infarction.  Lateral T wave abnormality.  Normal intervals.  June 16, 2018 ECG (independently read by me): Normal sinus rhythm at 84 bpm.  Right bundle branch block with repolarization changes.  Inferior Q waves consistent with prior infarct.  Anteroseptal Q waves consistent with prior anterior MI.  May 28, 2018 ECG (independently read by me): Normal sinus rhythm with sinus arrhythmia.  Right bundle branch block with repolarization changes.  Q waves consistent with anteroseptal MI and possible inferior MI no indication to adjust Bobby pain upon his needs  July 17,2019 ECG (independently read by me): Normal sinus rhythm 89 bpm.  Right bundle branch block with repolarization changes.  Inferior Q waves and anterior  Q waves consistent with anterolateral infarct with possible inferior infarct  February 21, 2018 ECG (independently read by me): Normal sinus rhythm at 95 bpm, right bundle branch block with repolarization changes.  Anterolateral MI.  QTc interval 477 ms  Jan 24, 2018 ECG (independently read by me): Sinus rhythm at 97 bpm.  Right bundle branch block with repolarization changes.  QTc interval 472 ms.  Anterolateral Q  waves  Jan 07, 2018 ECG (independently read by me): Normal sinus rhythm at 88 bpm.  Right bundle branch block with repolarization changes.  Inferior Q waves.  T wave anteroseptally, possible anteroseptal MI undetermined.  November 2017 ECG (independently read by me): Normal sinus rhythm at 64 bpm.  Branch block with repolarization changes.  QTc interval 460 ms.  June 2016 ECG (independently read by me): Sinus bradycardia at 57 bpm, right bundle branch block with repositioning changes.  QTc interval 459 ms.  September 2015 ECG (independently read by me and (: Normal sinus rhythm.  Right bundle branch block with repolarization changes.  Ventricular rate 81.  02/25/2014 ECG Normal sinus rhythm.  Right bundle branch block with repolarization changes.  PR interval 148 ms; QTc interval 463 ms  Prior ECG: sinus rhythm at 76 beats per minute; right bundle-branch block with repolarization changes. PR interval 142 ms, QTC 481 ms.   LABS:  BMP Latest Ref Rng & Units 11/05/2019 01/21/2019 10/13/2018  Glucose 70 - 99 mg/dL 104(H) 81 97  BUN 8 - 23 mg/dL 24(H) 26 37(H)  Creatinine 0.61 - 1.24 mg/dL 2.25(H) 1.81(H) 2.07(H)  BUN/Creat Ratio 10 - 24 - 14 18  Sodium 135 - 145 mmol/L 138 138 139  Potassium 3.5 - 5.1 mmol/L 4.8 4.7 5.4(H)  Chloride 98 - 111 mmol/L 105 103 104  CO2 22 - 32 mmol/L 24 21 19(L)  Calcium 8.9 - 10.3 mg/dL 9.3 9.8 9.7   Hepatic Function Latest Ref Rng & Units 11/05/2019 01/13/2018 08/20/2017  Total Protein 6.5 - 8.1 g/dL 6.5 6.6 6.9  Albumin 3.5 - 5.0 g/dL 3.9 4.2 3.7  AST 15 - 41 U/L _0 ALT 0 - 44 U/L 22 30 44  Alk Phosphatase 38 - 126 U/L 94 129(H) 106  Total Bilirubin 0.3 - 1.2 mg/dL 0.7 <0.2 0.4   CBC Latest Ref Rng & Units 11/05/2019 01/21/2019 09/18/2018  WBC 4.0 - 10.5 K/uL 7.2 8.1 8.7  Hemoglobin 13.0 - 17.0 g/dL 13.9 14.1 13.1  Hematocrit 39.0 - 52.0 % 44.3 40.5 42.1  Platelets 150 - 400 K/uL 156  182 179   Lab Results  Component Value Date   MCV 94.3 11/05/2019    MCV 85 01/21/2019   MCV 89.8 09/18/2018   Lab Results  Component Value Date   TSH 0.598 02/21/2018   Lipid Panel     Component Value Date/Time   CHOL 148 01/13/2018 0839   TRIG 265 (H) 01/13/2018 0839   HDL 36 (L) 01/13/2018 0839   CHOLHDL 4.1 01/13/2018 0839   CHOLHDL 4.8 08/09/2017 1117   VLDL 52 (H) 08/09/2017 1117   LDLCALC 59 01/13/2018 0839   INR result since initiation of Coumadin: 1:> 7.6;> >10;> 8.6 (Vit K administered) >4.3; no longer on Coumadin, now on Eliquis  RADIOLOGY: No results found.  IMPRESSION: 1. Chronic combined systolic and diastolic heart failure ([place])   2. Coronary artery disease involving native coronary artery of native heart with other form of angina pectoris ([place])   3. Ischemic cardiomyopathy   4. Hyperlipidemia LDL goal <70   5. Essential hypertension   6. Long term (current) use of anticoagulants   7. OSA (obstructive sleep apnea)   8. Stage 3b chronic kidney disease ([place])   9. RBBB     ASSESSMENT AND PLAN:  Bobby [name] is a 75 year old Caucasian male who suffered an acute coronary syndrome on 08/04/2012 when he presented with subtotal occlusion of his RCA. He had diffuse disease beyond Bobby subtotal occlusion and ultimately had successful insertion of a 3.25x32 mm Promus DES stent. He had concomitant CAD with 40-50% proximal LAD narrowing, 30% circumflex marginal stenoses. NMR lipoprofile after initiation of atorvastatin in January 2014 showed marked improvement in total cholesterol of 119 LDL 48 LDL particle #992. HDL was very low at 30 with an increased triglyceride at 203 and significantly reduced HDL particle #24.2. His insulin resistance score was elevated at 65.  He suffered an anterior STEMI in December 2018 and underwent successful stenting and several days later he required staged intervention to circumflex and marginal vessel. 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  Warfarin was initially prescribed but  due to significant issues with compliance and overmedication Bobby decision was made that he would be safer using Eliquis even though he is on this for apical thrombus rather than atrial fibrillation.   He was subsequently found to have significant obstructive sleep apnea but stopped therapy.  He is now using nocturnal oxygen.  His echo Doppler study in June 2021 continue to show an EF at 35 to 40% with moderate dilation of LV internal dimensions.  There was mild LVH and grade 1 diastolic dysfunction.  There was evidence for moderate aortic sclerosis with mild mitral annular calcification.  There was no evidence for his previous apical thrombus.  Over Bobby past year, Bobby [name] remained relatively stable on his current regimen of amlodipine 5 mg, carvedilol 25 mg, losartan 50 mg and spironolactone 25 mg.  He continues on anticoagulation with Eliquis.  He had laboratory done by Dr. Horald Pollen in November which I reviewed today and his creatinine was elevated at 1.94.  He does experience some mild shortness of breath.  Ideally he would be beneficial if he was able to switch from losartan to [facility].  However with his renal function with a creatinine of 1.94 in November 2021 I have recommended follow-up fasting laboratory.  I will check a comprehensive metabolic panel, lipid panel CBC and TSH level.  I am also recommending a 65-monthfollow-up echo Doppler  assessment to reassess systolic and diastolic function, valvular architecture, make certain there is no return of apical thrombus.  If renal function has improved, we will then determine possible transition to [facility].  I have strongly recommended complete smoking cessation.  He has been smoking for 65 years and started at age 86.  He continues to be on atorvastatin 80 mg for hyperlipidemia with target LDL less than 70.  I will see him in 2 months for reevaluation or sooner as needed.  Troy Sine, MD, [facility]  05/11/2021 3:05 PM

## 2021-05-30 LAB — COMPREHENSIVE METABOLIC PANEL
ALT: 18 IU/L (ref 0–44)
AST: 17 IU/L (ref 0–40)
Albumin/Globulin Ratio: 2 (ref 1.2–2.2)
Albumin: 4.3 g/dL (ref 3.7–4.7)
Alkaline Phosphatase: 109 IU/L (ref 44–121)
BUN/Creatinine Ratio: 15 (ref 10–24)
BUN: 31 mg/dL — ABNORMAL HIGH (ref 8–27)
Bilirubin Total: 0.3 mg/dL (ref 0.0–1.2)
CO2: 21 mmol/L (ref 20–29)
Calcium: 10.1 mg/dL (ref 8.6–10.2)
Chloride: 107 mmol/L — ABNORMAL HIGH (ref 96–106)
Creatinine, Ser: 2.02 mg/dL — ABNORMAL HIGH (ref 0.76–1.27)
Globulin, Total: 2.2 g/dL (ref 1.5–4.5)
Glucose: 108 mg/dL — ABNORMAL HIGH (ref 70–99)
Potassium: 5.6 mmol/L — ABNORMAL HIGH (ref 3.5–5.2)
Sodium: 142 mmol/L (ref 134–144)
Total Protein: 6.5 g/dL (ref 6.0–8.5)
eGFR: 34 mL/min/{1.73_m2} — ABNORMAL LOW (ref >59)

## 2021-05-30 LAB — LIPID PANEL
Chol/HDL Ratio: 3.5 ratio (ref 0.0–5.0)
Cholesterol, Total: 141 mg/dL (ref 100–199)
HDL: 40 mg/dL (ref >39)
LDL Chol Calc (NIH): 77 mg/dL (ref 0–99)
Triglycerides: 133 mg/dL (ref 0–149)
VLDL Cholesterol Cal: 24 mg/dL (ref 5–40)

## 2021-05-30 LAB — CBC
Hematocrit: 42.3 % (ref 37.5–51.0)
Hemoglobin: 13.8 g/dL (ref 13.0–17.7)
MCH: 29.3 pg (ref 26.6–33.0)
MCHC: 32.6 g/dL (ref 31.5–35.7)
MCV: 90 fL (ref 79–97)
Platelets: 179 10*3/uL (ref 150–450)
RBC: 4.71 x10E6/uL (ref 4.14–5.80)
RDW: 13.5 % (ref 11.6–15.4)
WBC: 8.4 10*3/uL (ref 3.4–10.8)

## 2021-05-30 LAB — TSH: TSH: 0.878 u[IU]/mL (ref 0.450–4.500)

## 2021-06-02 ENCOUNTER — Other Ambulatory Visit: Payer: Self-pay

## 2021-06-02 DIAGNOSIS — E875 Hyperkalemia: Secondary | ICD-10-CM

## 2021-06-05 ENCOUNTER — Ambulatory Visit (HOSPITAL_COMMUNITY): Payer: Medicare Other | Attending: Cardiovascular Disease

## 2021-06-05 ENCOUNTER — Other Ambulatory Visit: Payer: Self-pay

## 2021-06-05 DIAGNOSIS — I5042 Chronic combined systolic (congestive) and diastolic (congestive) heart failure: Secondary | ICD-10-CM | POA: Diagnosis present

## 2021-06-05 LAB — ECHOCARDIOGRAM COMPLETE
Area-P 1/2: 3.92 cm2
S' Lateral: 4.4 cm

## 2021-06-05 MED ORDER — PERFLUTREN LIPID MICROSPHERE
1.0000 mL | INTRAVENOUS | Status: AC | PRN
  Administered 2021-06-05: 2 mL via INTRAVENOUS

## 2021-06-07 ENCOUNTER — Institutional Professional Consult (permissible substitution): Payer: Medicare Other | Admitting: Pulmonary Disease

## 2021-06-13 NOTE — Progress Notes (Signed)
Synopsis: Referred for COPD by Hayden Rasmussen, MD  Subjective:   PATIENT ID: Bobby [name] GENDER: male DOB: Oct 27, 1945, MRN: [id number]  Chief Complaint  Patient presents with   Consult    Pt states he has complaints of SOB that happens when he exerts himself.   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 Quit once for a year cold [place]. Has never tried patches, gum, or chantix.  He has DOE to 50 yards. THis is worse over last year or two. Some of his limitation however is due to back/knee pain - 'my legs give out on me.' He doesn't have much of a cough. He says the last time he took prednisone for AECOPD was about a year ago. He does think when he takes it that it's helpful.   He is on 4L O2 at night only.  Otherwise pertinent review of systems is negative.  No family history of lung cancer  Worked in roofing. Retired. No pets. Never has lived outside of [place].   Past Medical History:  Diagnosis Date   CAD (coronary artery disease),residual non obstructive disease    a. 08/2012 Inferior STEMI: RCA 99% (3.5x32 Promus DES), nonobs LAD/LCX dzs;  b. 01/2013 MV: EF 66%, small inferoseptal artifact and possible small inferobasilar scar;  c. 03/2014 MV: fixed inf defect->diaph attenuation, no ischemia. 12/18 PCI/DES to mLAD, staged PCI/DES Lcx/OM   DM (diabetes mellitus), type 2 new diagnosis 08/05/2012   diet control   ED (erectile dysfunction)    surgery planned   History of echocardiogram    a. 08/2012 Echo: EF 55-60%, possible mild basal inferior HK, mildly dil RA, PASP 1mHg.   Hyperlipidemia 08/05/2012   Hypertensive heart disease    [place]ocardial infarction) [place] 08/04[place]2012   acute inferior stemi secondary to RCA occlusion; PCI   Panic attack    [place]    echo  08/05/12, EF 55-60%, PA pressure 457m  S/P coronary artery stent placement, to RCA Promus DES 08/05/2012   Stroke ([place]   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   Tobacco abuse      Family History  Problem Relation Age of Onset   Kidney disease Father        kidney disease and heart disease   Heart disease Maternal Grandmother    Stomach cancer Maternal Grandfather    Heart attack Paternal Grandmother      Past Surgical History:  Procedure Laterality Date   CARDIAC CATHETERIZATION  08/04/12   PCI to RCA with DES   CORONARY STENT INTERVENTION N/A 08/12/2017   Procedure: CORONARY STENT INTERVENTION;  Surgeon: JoMartiniquePeter M, MD;  [place];  Service: Cardiovascular;  Laterality: N/A;   CORONARY/GRAFT ACUTE MI REVASCULARIZATION N/A 08/09/2017   Procedure: Coronary/Graft Acute MI Revascularization;  Surgeon: SmBelva CromeMD;  [place];  Service: Cardiovascular;  Laterality: N/A;   LEFT HEART CATH AND CORONARY ANGIOGRAPHY N/A 08/09/2017   Procedure: LEFT HEART CATH AND CORONARY ANGIOGRAPHY;  Surgeon: SmBelva CromeMD;  [facility];  Service: Cardiovascular;  Laterality: N/A;   LEFT HEART CATHETERIZATION WITH CORONARY ANGIOGRAM N/A 08/04/2012   Procedure: LEFT HEART CATHETERIZATION WITH CORONARY ANGIOGRAM;  Surgeon: ThTroy SineMD;  Location:  [place] CATH LAB;  Service: Cardiovascular;  Laterality: N/A;   LEG SURGERY Left    rod placed for fracture repair   PENILE PROSTHESIS IMPLANT N/A 06/03/2014   Procedure: IMPLANT PENILE PROTHESIS INFLATABLE;  Surgeon: Ailene Rud, MD;  Location: [facility];  Service: Urology;  Laterality: N/A;  with penile block--0.5% marcaine plain   PERCUTANEOUS CORONARY STENT INTERVENTION (PCI-S) Right 08/04/2012   Procedure: PERCUTANEOUS CORONARY STENT INTERVENTION (PCI-S);  Surgeon: Troy Sine, MD;  Location: [facility] CATH LAB;  Service: Cardiovascular;  Laterality: Right;   THUMB ARTHROSCOPY Left    thumb joint  replaced    Social History   Socioeconomic History   Marital status: Legally Separated    Spouse name: Not on file   Number of children: Not on file   Years of education: Not on file   Highest education level: Not on file  Occupational History   Not on file  Tobacco Use   Smoking status: Every Day    Packs/day: 2.00    Years: 56.00    Pack years: 112.00    Types: Cigarettes   Smokeless tobacco: Never   Tobacco comments:    Currently smoking about .5ppd as of 06/14/21 ep  Substance and Sexual Activity   Alcohol use: Not Currently    Comment: ocasionally   Drug use: No   Sexual activity: Not on file  Other Topics Concern   Not on file  Social History Narrative   Not on file   Social Determinants of Health   Financial Resource Strain: Not on file  Food Insecurity: Not on file  Transportation Needs: Not on file  Physical Activity: Not on file  Stress: Not on file  Social Connections: Not on file  Intimate Partner Violence: Not on file     Allergies  Allergen Reactions   Hydrocodone Itching     Outpatient Medications Prior to Visit  Medication Sig Dispense Refill   albuterol (PROVENTIL HFA;VENTOLIN HFA) 108 (90 Base) MCG/ACT inhaler Inhale 2 puffs into the lungs every 6 (six) hours as needed for wheezing or shortness of breath. 1 Inhaler 2   albuterol (PROVENTIL) (2.5 MG/3ML) 0.083% nebulizer solution Take 5 mg by nebulization every 4 (four) hours as needed. For wheezing or SOB     amLODipine (NORVASC) 5 MG tablet TAKE ONE TABLET BY MOUTH DAILY 180 tablet 1   apixaban (ELIQUIS) 5 MG TABS tablet Take 1 tablet (5 mg total) by mouth 2 (two) times daily. 28 tablet 1   aspirin EC 81 MG tablet Take 1 tablet (81 mg total) by mouth daily. Swallow whole. 90 tablet 3   atorvastatin (LIPITOR) 80 MG tablet Take 1 tablet (80 mg total) by mouth daily at 6 PM. 90 tablet 3   benzonatate (TESSALON) 100 MG capsule Take 100-200 mg by mouth 3 (three) times daily as needed.     BREZTRI  AEROSPHERE 160-9-4.8 MCG/ACT AERO Inhale 2 puffs into the lungs 2 (two) times daily.     buPROPion (WELLBUTRIN XL) 300 MG 24 hr tablet Take 300 mg by mouth daily.     carvedilol (COREG) 25 MG tablet TAKE ONE TABLET BY MOUTH TWICE DAILY 180 tablet 3   clonazePAM (KLONOPIN) 0.5 MG tablet Take 1 mg by mouth 2 (two) times daily.   0   Coenzyme Q10 (COQ-10) 100 MG CAPS Take 1 capsule by mouth daily.      COLCRYS 0.6 MG tablet Take 0.6 mg by mouth daily.  3   losartan (COZAAR)  50 MG tablet TAKE ONE TABLET BY MOUTH DAILY 90 tablet 2   metFORMIN (GLUCOPHAGE-XR) 500 MG 24 hr tablet Take 500 mg by mouth daily.  3   nitroGLYCERIN (NITROSTAT) 0.4 MG SL tablet DISSOLVE 1 TABLET UNDER THE TONGUE EVERY 5 MINUTES AS NEEDED FOR CHEST PAIN. DO NOT EXCEED A TOTAL OF 3 DOSES IN 15 MINUTES. 90 tablet 3   Omega-3 Fatty Acids (FISH OIL PO) Take 1 tablet by mouth daily.     oxyCODONE-acetaminophen (PERCOCET) 10-325 MG per tablet Take 1 tablet by mouth 3 (three) times daily.     predniSONE (DELTASONE) 10 MG tablet Take 10 mg by mouth daily.     sertraline (ZOLOFT) 100 MG tablet Take 100 mg by mouth daily.     spironolactone (ALDACTONE) 25 MG tablet Take 25 mg by mouth daily.     No facility-administered medications prior to visit.       Objective:   Physical Exam:  General appearance: 75 y.o., male, NAD, conversant  Eyes: anicteric sclerae, moist conjunctivae; no lid-lag; PERRL, tracking appropriately HENT: NCAT; oropharynx, MMM, no mucosal ulcerations; normal hard and soft palate Neck: Trachea midline; no lymphadenopathy, no JVD Lungs: CTAB, no crackles, no wheeze, with normal respiratory effort CV: RRR, no MRGs  Abdomen: Soft, non-tender; non-distended, BS present  Extremities: No peripheral edema, radial and DP pulses present bilaterally  Skin: Normal temperature, turgor and texture; no rash Psych: Appropriate affect Neuro: Alert and oriented to person and place, no focal deficit    Vitals:    06/14/21 1542  BP: 122/78  Pulse: (!) 58  SpO2: 96%  Weight: 259 lb 3.2 oz (117.6 kg)  Height: '5\' 9"'$  (1.753 m)   96% on RA BMI Readings from Last 3 Encounters:  06/14/21 38.28 kg/m  05/11/21 37.86 kg/m  11/09/20 37.95 kg/m   Wt Readings from Last 3 Encounters:  06/14/21 259 lb 3.2 oz (117.6 kg)  05/11/21 256 lb 6.4 oz (116.3 kg)  11/09/20 257 lb (116.6 kg)     CBC    Component Value Date/Time   WBC 8.4 05/30/2021 1050   WBC 7.2 11/05/2019 1413   RBC 4.71 05/30/2021 1050   RBC 4.70 11/05/2019 1413   HGB 13.8 05/30/2021 1050   HCT 42.3 05/30/2021 1050   PLT 179 05/30/2021 1050   MCV 90 05/30/2021 1050   MCH 29.3 05/30/2021 1050   MCH 29.6 11/05/2019 1413   MCHC 32.6 05/30/2021 1050   MCHC 31.4 11/05/2019 1413   RDW 13.5 05/30/2021 1050   LYMPHSABS 1.0 11/05/2019 1413   MONOABS 0.2 11/05/2019 1413   EOSABS 0.5 11/05/2019 1413   BASOSABS 0.1 11/05/2019 1413   Eos of 2-500  Chest Imaging: CXR 2020 reviewed by me, unremarkable  Pulmonary Functions Testing Results: No flowsheet data found.    Echocardiogram:   TTE 06/05/21:  1. Septal and apical akinesis inferior wall hypokinesis overall EF  similar to TTE 02/16/20. Left ventricular ejection fraction, by estimation,  is 30 to 35%. The left ventricle has moderately decreased function. The  left ventricle has no regional wall  motion abnormalities. Left ventricular diastolic parameters are consistent  with Grade I diastolic dysfunction (impaired relaxation).   2. Right ventricular systolic function is normal. The right ventricular  size is normal.   3. Left atrial size was moderately dilated.   4. Right atrial size was mildly dilated.   5. The mitral valve is abnormal. Mild mitral valve regurgitation. No  evidence of mitral stenosis.  6. The aortic valve is tricuspid. Aortic valve regurgitation is not  visualized. Mild to moderate aortic valve sclerosis/calcification is  present, without any evidence of  aortic stenosis.   7. The inferior vena cava is normal in size with greater than 50%  respiratory variability, suggesting right atrial pressure of 3 mmHg.      Assessment & Plan:   # DOE # Possible COPD DOE is likely multifactorial in setting likely COPD, combined systolic and diastolic heart failure, deconditioning, CAD.   # Smoking Precontemplative with regard to cessation. Not interested in chantix or patches/gum  # OSA Not adherent to CPAP.  Plan: - PFTs - continue breztri 2 puffs twice daily - pulm rehab referral placed today - lung cancer screening referral placed today - smoking cessation encouraged but he is precontemplative with regard to cessation - encouraged adherence to [place], MD [facility] 06/14/2021 4:28 PM

## 2021-06-14 ENCOUNTER — Other Ambulatory Visit: Payer: Self-pay

## 2021-06-14 ENCOUNTER — Encounter: Payer: Self-pay | Admitting: Student

## 2021-06-14 ENCOUNTER — Ambulatory Visit (INDEPENDENT_AMBULATORY_CARE_PROVIDER_SITE_OTHER): Payer: Medicare Other | Admitting: Student

## 2021-06-14 VITALS — BP 122/78 | HR 58 | Ht 69.0 in | Wt 259.2 lb

## 2021-06-14 DIAGNOSIS — F17200 Nicotine dependence, unspecified, uncomplicated: Secondary | ICD-10-CM

## 2021-06-14 DIAGNOSIS — J449 Chronic obstructive pulmonary disease, unspecified: Secondary | ICD-10-CM | POA: Diagnosis not present

## 2021-06-14 DIAGNOSIS — E875 Hyperkalemia: Secondary | ICD-10-CM

## 2021-06-14 NOTE — Patient Instructions (Addendum)
-   Breathing tests in the next week or two - Referral to pulmonary rehab placed today - Stay on breztri 2 puffs twice daily - rinse your mouth out after each use - Albuterol inhaler as needed

## 2021-06-15 ENCOUNTER — Encounter (HOSPITAL_COMMUNITY): Payer: Self-pay | Admitting: *Deleted

## 2021-06-15 LAB — BASIC METABOLIC PANEL
BUN/Creatinine Ratio: 19 (ref 10–24)
BUN: 37 mg/dL — ABNORMAL HIGH (ref 8–27)
CO2: 16 mmol/L — ABNORMAL LOW (ref 20–29)
Calcium: 10.2 mg/dL (ref 8.6–10.2)
Chloride: 106 mmol/L (ref 96–106)
Creatinine, Ser: 1.99 mg/dL — ABNORMAL HIGH (ref 0.76–1.27)
Glucose: 93 mg/dL (ref 70–99)
Potassium: 5.5 mmol/L — ABNORMAL HIGH (ref 3.5–5.2)
Sodium: 138 mmol/L (ref 134–144)
eGFR: 34 mL/min/{1.73_m2} — ABNORMAL LOW (ref >59)

## 2021-06-15 NOTE — Progress Notes (Signed)
Received referral from Dr. Verlee Monte for this pt to participate in pulmonary rehab with the diagnosis of unspecified COPD.  Pt scheduled for full PFT on 11/9.  Will be able to determine which stage applies Clinical review of pt follow up appt on 10/12 Pulmonary office note.  Pt with Covid Risk Score - 9. Pt appropriate for scheduling for Pulmonary rehab once PFT have been completed on 11/9.  Will forward to support staff for scheduling and verification of insurance eligibility/benefits with pt consent. Cherre Huger, BSN Cardiac and Training and development officer

## 2021-07-03 ENCOUNTER — Other Ambulatory Visit: Payer: Self-pay

## 2021-07-03 DIAGNOSIS — E875 Hyperkalemia: Secondary | ICD-10-CM

## 2021-07-03 DIAGNOSIS — E785 Hyperlipidemia, unspecified: Secondary | ICD-10-CM

## 2021-07-04 ENCOUNTER — Other Ambulatory Visit: Payer: Self-pay | Admitting: Cardiovascular Disease

## 2021-07-10 ENCOUNTER — Other Ambulatory Visit: Payer: Self-pay

## 2021-07-10 DIAGNOSIS — E875 Hyperkalemia: Secondary | ICD-10-CM

## 2021-07-11 LAB — BASIC METABOLIC PANEL
BUN/Creatinine Ratio: 19 (ref 10–24)
BUN: 44 mg/dL — ABNORMAL HIGH (ref 8–27)
CO2: 21 mmol/L (ref 20–29)
Calcium: 10.3 mg/dL — ABNORMAL HIGH (ref 8.6–10.2)
Chloride: 108 mmol/L — ABNORMAL HIGH (ref 96–106)
Creatinine, Ser: 2.36 mg/dL — ABNORMAL HIGH (ref 0.76–1.27)
Glucose: 164 mg/dL — ABNORMAL HIGH (ref 70–99)
Potassium: 5.5 mmol/L — ABNORMAL HIGH (ref 3.5–5.2)
Sodium: 138 mmol/L (ref 134–144)
eGFR: 28 mL/min/{1.73_m2} — ABNORMAL LOW (ref >59)

## 2021-07-14 ENCOUNTER — Other Ambulatory Visit: Payer: Self-pay

## 2021-07-14 DIAGNOSIS — F17200 Nicotine dependence, unspecified, uncomplicated: Secondary | ICD-10-CM

## 2021-07-14 DIAGNOSIS — J449 Chronic obstructive pulmonary disease, unspecified: Secondary | ICD-10-CM

## 2021-07-17 ENCOUNTER — Ambulatory Visit (INDEPENDENT_AMBULATORY_CARE_PROVIDER_SITE_OTHER): Payer: Medicare Other | Admitting: Student

## 2021-07-17 ENCOUNTER — Other Ambulatory Visit: Payer: Self-pay

## 2021-07-17 ENCOUNTER — Encounter: Payer: Self-pay | Admitting: Cardiovascular Disease

## 2021-07-17 ENCOUNTER — Ambulatory Visit (INDEPENDENT_AMBULATORY_CARE_PROVIDER_SITE_OTHER): Payer: Medicare Other | Admitting: Cardiovascular Disease

## 2021-07-17 ENCOUNTER — Telehealth: Payer: Self-pay

## 2021-07-17 DIAGNOSIS — I25118 Atherosclerotic heart disease of native coronary artery with other forms of angina pectoris: Secondary | ICD-10-CM | POA: Diagnosis not present

## 2021-07-17 DIAGNOSIS — I4510 Unspecified right bundle-branch block: Secondary | ICD-10-CM

## 2021-07-17 DIAGNOSIS — Z7901 Long term (current) use of anticoagulants: Secondary | ICD-10-CM

## 2021-07-17 DIAGNOSIS — E785 Hyperlipidemia, unspecified: Secondary | ICD-10-CM | POA: Diagnosis not present

## 2021-07-17 DIAGNOSIS — N184 Chronic kidney disease, stage 4 (severe): Secondary | ICD-10-CM

## 2021-07-17 DIAGNOSIS — I2510 Atherosclerotic heart disease of native coronary artery without angina pectoris: Secondary | ICD-10-CM

## 2021-07-17 DIAGNOSIS — J449 Chronic obstructive pulmonary disease, unspecified: Secondary | ICD-10-CM

## 2021-07-17 DIAGNOSIS — G4733 Obstructive sleep apnea (adult) (pediatric): Secondary | ICD-10-CM

## 2021-07-17 DIAGNOSIS — I5042 Chronic combined systolic (congestive) and diastolic (congestive) heart failure: Secondary | ICD-10-CM | POA: Diagnosis not present

## 2021-07-17 DIAGNOSIS — I255 Ischemic cardiomyopathy: Secondary | ICD-10-CM | POA: Diagnosis not present

## 2021-07-17 DIAGNOSIS — Z720 Tobacco use: Secondary | ICD-10-CM

## 2021-07-17 LAB — PULMONARY FUNCTION TEST
DL/VA % pred: 77 %
DL/VA: 3.11 ml/min/mmHg/L
DLCO cor % pred: 81 %
DLCO cor: 19.17 ml/min/mmHg
DLCO unc % pred: 81 %
DLCO unc: 19.17 ml/min/mmHg
FEF 25-75 Post: 2.4 L/sec
FEF 25-75 Pre: 2.59 L/sec
FEF2575-%Change-Post: -7 %
FEF2575-%Pred-Post: 117 %
FEF2575-%Pred-Pre: 127 %
FEV1-%Change-Post: 1 %
FEV1-%Pred-Post: 99 %
FEV1-%Pred-Pre: 97 %
FEV1-Post: 2.79 L
FEV1-Pre: 2.75 L
FEV1FVC-%Change-Post: 2 %
FEV1FVC-%Pred-Pre: 105 %
FEV6-%Change-Post: 1 %
FEV6-%Pred-Post: 97 %
FEV6-%Pred-Pre: 96 %
FEV6-Post: 3.56 L
FEV6-Pre: 3.52 L
FEV6FVC-%Change-Post: 0 %
FEV6FVC-%Pred-Post: 106 %
FEV6FVC-%Pred-Pre: 106 %
FVC-%Change-Post: -1 %
FVC-%Pred-Post: 91 %
FVC-%Pred-Pre: 92 %
FVC-Post: 3.56 L
FVC-Pre: 3.61 L
Post FEV1/FVC ratio: 78 %
Post FEV6/FVC ratio: 100 %
Pre FEV1/FVC ratio: 76 %
Pre FEV6/FVC Ratio: 99 %

## 2021-07-17 NOTE — Progress Notes (Signed)
PFT done today. 

## 2021-07-17 NOTE — Telephone Encounter (Addendum)
Left voice message for patient to call office back for results.   ----- Message from Troy Sine, MD sent at 07/17/2021  9:38 AM EST ----- Cr is further increased at 2.36.  Potassium is elevated at 5.5.  Avoid potassium containing foods.  Hold losartan.  Recheck bmet later this week.

## 2021-07-17 NOTE — Patient Instructions (Signed)
Medication Instructions:  Stop taking spironolactone. Hold the losartan until lab results are in. *If you need a refill on your cardiac medications before your next appointment, please call your pharmacy*   Lab Work: Get blood work (BMET) on Thursday July 20, 2021. If you have labs (blood work) drawn today and your tests are completely normal, you will receive your results only by: [place] (if you have MyChart) OR A paper copy in the mail If you have any lab test that is abnormal or we need to change your treatment, we will call you to review the results.    Follow-Up: At [facility], you and your health needs are our priority.  As part of our continuing mission to provide you with exceptional heart care, we have created designated Provider Care Teams.  These Care Teams include your primary Cardiologist (physician) and Advanced Practice Providers (APPs -  Physician Assistants and Nurse Practitioners) who all work together to provide you with the care you need, when you need it.  We recommend signing up for the patient portal called "MyChart".  Sign up information is provided on this After Visit Summary.  MyChart is used to connect with patients for Virtual Visits (Telemedicine).  Patients are able to view lab/test results, encounter notes, upcoming appointments, etc.  Non-urgent messages can be sent to your provider as well.   To learn more about what you can do with MyChart, go to [email or web address].    Your next appointment:   6 week(s)  The format for your next appointment:   In Person  Provider:   Shelva Majestic, MD

## 2021-07-17 NOTE — Progress Notes (Signed)
Patient ID: Bobby [name], male   DOB: Sep 15, 1945, 75 y.o.   MRN: [id number]    HPI: Bobby [name] is a 74 y.o. male presents to the office today for a 2 month followup evaluation.   Bobby [name]  has a long-standing tobacco history having started smoking at age 49, a history of hypertension, remote small TIA/CVA who presented to the hospital on 08/04/2012 in the setting of inferior ST segment elevation myocardial infarction. Catheterization by me revealed a 99% stenosis of the RCA and concomitant CAD involving his LAD and circumflex vessels. He underwent acute percutaneous coronary intervention with an excellent door to balloon time of only 20 minutes and ultimately insertion of a 3.25x32 mm DES stent post dilated 3.3. An echo done in the hospital showed an EF of 50-60% with mild basal inferior probable scar and moderate pulmonary hypertension with PA pressure of 45 mm.  When I saw Bobby [name] in May 2014, he did note shortness of breath with activity. He denied definitive chest pain. At that time, he realized that he had inadvertentlystopped taking the atorvastatin 40 mg dose and this was resumed. In addition, his Toprol dose was increased from 50 to 75 mg daily. A nuclear study showed an ejection fraction of 66%. Perfusion was essentially normal with exception of a small region of fixed inferoseptal bowel artifact and possible small area of inferobasilar scar.   Bobby [name] unfortunately continues to smoke at least a pack of cigarettes per day.   He does experience shortness of breath with activity.  He denies recent chest pain.  He has chronic right bundle branch block with repolarization changes.  He is diabetic and has pulmonary hypertension.  A follow-up nuclear perfusion study on 03/26/2014 remianed low risk and showed normal LV function and normal wall motion.  There was a fixed inferior defect that was felt most consistent with diaphragmatic attenuation.  There was no evidence for ischemia.  He underwent  prostate surgery by Dr. Gaynelle [name] without cardiovascular compromise.    A screening abdominal aortic ultrasound revealed a normal abdominal aorta.  Since I saw him in November 2017, he had an extensive history and has had recurrent hospitalizations he was hospitalized in October 2018 for left arm pain.  Troponin was negative.  In December 2018 he suffered an anterior ST segment elevation MI due to acute occlusion of his mid LAD and underwent successful PCI of his LAD with insertion of a 2.5 x 22 mm Resolute Onyx DES stent.  He was also found to have 99% dominant mid left circumflex lesion with 80% stenosis in the OM1 vessel and underwent staged intervention to the mid circumflex and marginal vessels.  Troponin was greater than 65.  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  There was mention of sludge is a sign of pre-thrombotic state but no definite thrombus was seen at the apex.  He returned to the hospital August 20, 2017 with malaise and dyspnea.  He was bradycardic.  He was diuresed.  He has renal insufficiency with creatinines increasing up to 1.9.  He has been followed by Bobby [name] on numerous occasions in the office.  Unfortunately continues to smoke cigarettes.  He admits to 1/2 pack/day.  He does note occasional dizziness as well as some shortness of breath.  He denies recurrent anginal type symptoms.   Suggestive of low flow state.  When I saw him on Jan 07, 2018  I recommended that he undergo a follow-up echo Doppler study to reassess systolic and diastolic function as well as potential for apical thrombus formation.  He had continued to be on aspirin and Brilinta following his STEMI.  He was on atorvastatin 80 mg for hyperlipidemia with target less than 70.  I had a long discussion regarding smoking cessation.  He underwent a follow-up echo Doppler study on Jan 20, 2018.  This showed reduced LV function with an EF of 35 to  40% with akinesis of the mid apical anterolateral and septal as well as apical walls.  There was grade 1 diastolic dysfunction.  There was now a possible small layered thrombus at the apex noted using Definity and also evidence of swirling at the LV apex suggestive of low flow state.   As result of the echo findings, I saw him in follow-up on Jan 24, 2018 at which time I had a very long discussion with him guarding potential thromboembolic stroke risk.  He was started on warfarin anticoagulation.  Apparently, the patient's medications have been in a  pack and had been followed by his girlfriend.  He was taking his medications correctly resulting in significant over anticoagulation with INR reached greater than 10.  He has been followed very closely by our pharmacy department and he also was given vitamin K.  Due to his poor medication compliance after much discussion ultimately the thought is for him to transition to Bobby [name] once his INR gets below 2.  He also has been taking his blood pressure medicines incorrectly.  Patient has been sleeping most of the day.  In the past I had recommended he undergo a sleep evaluation for sleep apnea which he never followed up with it was a no-show in the sleep lab.  He was worked into my schedule on February 21, 2018 and was seen as an add-on due to his medication issues.  He was here now with his sister who in the past had done an excellent job in caring for his medications and she is committed now to resume doing this for him.  During his evaluation, he was exceptionally somnolent, and a significant time was spent with medication adjustment.  Due to his inability to take effect of warfarin the decision was made to use Bobby [name] for anticoagulation with more consistent dosing.  His recent laboratory had shown stage IV chronic kidney disease and it was recommended that he hold his lisinopril for several days and then reduce the dose to 5 mg as well as holding his furosemide with  subsequent reduction of dose with reinstitution at 20 mg.  I scheduled him for an expeditious sleep study due to concerns for significant sleep apnea and he was seen5 days later by Bobby [name] with medication adjustment and anticoagulation issues.  He underwent a sleep study on February 25, 2018 and he was found to have moderate overall sleep apnea with an AHI of 22.2 and RDI of 25.4.  He could not achieve any rem sleep and the overall severity may very well be underestimated.  He was titrated up to 10 cm water pressure.  An initial trial of CPAP auto with an EPR range of 8-15 was recommended.  When I last saw him on March 19, 2018 he had he had just initiated CPAP the evening before noted significant improvement in his sleep. His renal function had significantly improved from a creatinine of 2.7 down to 1.362 weeks ago.  He is unaware of any  recurrent episodes of arrhythmia.  Apparently his blood pressure had become elevated and Bobby [name] added losartan to his medical regimen.  Of note, he also already was on lisinopril 2.5 mg, furosemide 20 mg, carvedilol 6.25 mg twice a day.  He has been only intermittently using CPAP due to complaints of the mask on his nose bridge.  As result, he was recently changed to a different mask.  A download was obtained in the office today in which he does not meet compliance.  [employer] is his DME company.  His set up date was March 17, 2018 and he has until June 17, 2018 to demonstrate compliance.    When I saw him in September 2019 his blood pressure was elevated.  At that time apparently he was on both the previous lisinopril and the recent losartan that was started by Bobby [name].  I suggested he discontinue lisinopril and further titrated losartan to 25 mg twice a day both for improved blood pressure and for his reduced LV function.  He was not compliant with reference to his CPAP therapy and I had a long discussion with him regarding the importance that he meet compliance by  October 15.  I saw him in October 2019.  At that time he was not using his CPAP therapy.  I reviewed his blood pressure recordings and they seem to be consistently elevated.  He continues to be on Bobby [name] and was taking Lasix 20 mg.  That evaluation I had a long discussion with him guarding his noncompliance with CPAP.  I discussed alternatives such as customized oral appliance.  We discussed with his LV dysfunction he may be a candidate for Jardiance if his renal function is stable.  Was maintaining sinus rhythm.  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  His ECG was unchanged.  Troponins were negative.  He was discharged from the ER and seen by Bobby [name], [facility] on October 01, 2018.  He had not had any further chest pain since he left the hospital and no ischemic work-up was planned.  His renal function had worsened and his furosemide was changed to every other day.  Visibly he feels well and has not had recurrent chest pain.  He uses oxygen at 2 L nasal cannula at nighttime instead of his CPAP therapy.  Laboratory 2 days prior to his office visit has shown a creatinine of 2.07 with a BUN of 37.  Potassium was 5.4.   I  saw him in February 2020.  At that time his blood pressure was stable and he was taken furosemide 20 mg every other day, carvedilol 18.75 mg twice a day, and valsartan 40 mg.  Renal function improved with discontinuance of furosemide.  I saw him in November 2020 when he came to the office with his sister. He was on supplemental oxygen at nighttime but has not been using CPAP.  Upon further questioning he had difficulty with the mask on the bridge of his nose.  Recently, his blood pressure has been elevated and he has been of seen by Bobby [name].  He apparently was now on losartan 50 mg in place of valsartan, carvedilol 18.75 mg twice a day, and he is on  Plavix in addition to apixaban 5 mg twice a day.  He continued to be on atorvastatin 80 mg daily for  hyperlipidemia.  He is diabetic on metformin.  He is on bupropion 300 mg for depression.  During that evaluation, his blood pressure was elevated at 162/90 I recommended further titration of carvedilol to 25 mg twice a day and added amlodipine 5 mg.  I again discussed the importance of using CPAP therapy.  He had stage III chronic kidney disease with creatinine improving from 2.07 to 1.77.  I saw him in May 2021 at which time any chest pain.He was treated with an infection by Bobby [name] and at times has had some recurrent congestion.  He is breathing better.  He uses supplemental oxygen but admits that at times he does not use it every night.  Unfortunately he still smoking a pack of cigarettes lasting 2 to 2-1/2 days.  Laboratory on November 05, 2019 showed a creatinine at 2.25.  He has recently been on [employer] in addition to albuterol for wheezing.  However he had was not consistently using his combination drug treatment.    I  saw him in May 02, 2020.  Over the prior months he continued to be stable from a cardiac standpoint.   He underwent a follow-up echo Doppler study on February 15, 2020 which showed an EF of 35 to 40%.  There was mild LVH of the basal septal segment.  There was grade 1 diastolic dysfunction.  He had previously noted akinesis of the apex and basal inferior wall and mid apical anteroseptal wall.  There was no evidence for residual thrombus.  Last week he under went right neck melanoma resection by Bobby [name].  He has further decreased his tobacco use.    He was evaluated by Bobby [name] in March 2022.  I last saw him on May 11, 2021 at which time he remained relatively stable but admitted to rare episodes of chest discomfort occurring approximately every 1 to 1-1/2 months.  Unfortunately he continues to smoke cigarettes.  He believes he is sleeping adequately.  His primary  physician, Bobby [name] had checked laboratory July 08, 2020.  Creatinine was increased at 1.94.  He is unaware of palpitations, presyncope or syncope.  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  He had anteroseptal Q waves consistent with his prior MI.  I recommended follow-up laboratory and a follow-up echo Doppler assessment.  Since I saw him he was evaluated by one of our pulmonary on June 15, 2019.[name]  He has COPD and dyspnea which is multifactorial as result of his combined systolic and diastolic heart failure, COPD, CAD, and cardiac deconditioning.  Presently, he denies chest pain.  Recent laboratory showing hyper kalemia with potassium at 5.5 and creatinine increased to 2.36.  He was advised to hold losartan as well as discontinue spironolactone.  He presents for follow-up evaluation.   Past Medical History:  Diagnosis Date   CAD (coronary artery disease),residual non obstructive disease    a. 08/2012 Inferior STEMI: RCA 99% (3.5x32 Promus DES), nonobs LAD/LCX dzs;  b. 01/2013 MV: EF 66%, small inferoseptal artifact and possible small inferobasilar scar;  c. 03/2014 MV: fixed inf defect->diaph attenuation, no ischemia. 12/18 PCI/DES to mLAD, staged PCI/DES Lcx/OM   DM (diabetes mellitus), type 2 new diagnosis 08/05/2012   diet control   ED (erectile dysfunction)    surgery planned   History of echocardiogram    a. 08/2012 Echo: EF 55-60%, possible mild basal inferior HK, mildly dil RA, PASP 36mmHg.  Hyperlipidemia 08/05/2012   Hypertensive heart disease    MI (myocardial infarction) ([place]) 08/04/2012   acute inferior stemi secondary to RCA occlusion; PCI   Panic attack    Pulmonary HTN ([place])    echo 08/05/12, EF 55-60%, PA pressure 61mm   S/P coronary artery stent placement, to RCA Promus DES 08/05/2012   Stroke [facility])    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   Tobacco abuse     Past Surgical History:   Procedure Laterality Date   CARDIAC CATHETERIZATION  08/04/12   PCI to RCA with DES   CORONARY STENT INTERVENTION N/A 08/12/2017   Procedure: CORONARY STENT INTERVENTION;  Surgeon: Martinique, Peter M, MD;  Location: [place] [facility];  Service: Cardiovascular;  Laterality: N/A;   CORONARY/GRAFT ACUTE MI REVASCULARIZATION N/A 08/09/2017   Procedure: Coronary/Graft Acute MI Revascularization;  Surgeon: Belva Crome, MD;  Location: [place] [facility];  Service: Cardiovascular;  Laterality: N/A;   LEFT HEART CATH AND CORONARY ANGIOGRAPHY N/A 08/09/2017   Procedure: LEFT HEART CATH AND CORONARY ANGIOGRAPHY;  Surgeon: Belva Crome, MD;  Location: [place] [facility];  Service: Cardiovascular;  Laterality: N/A;   LEFT HEART CATHETERIZATION WITH CORONARY ANGIOGRAM N/A 08/04/2012   Procedure: LEFT HEART CATHETERIZATION WITH CORONARY ANGIOGRAM;  Surgeon: Troy Sine, MD;  Location: [facility] CATH LAB;  Service: Cardiovascular;  Laterality: N/A;   LEG SURGERY Left    rod placed for fracture repair   PENILE PROSTHESIS IMPLANT N/A 06/03/2014   Procedure: IMPLANT PENILE PROTHESIS INFLATABLE;  Surgeon: Ailene Rud, MD;  Location: [facility];  Service: Urology;  Laterality: N/A;  with penile block--0.5% marcaine plain   PERCUTANEOUS CORONARY STENT INTERVENTION (PCI-S) Right 08/04/2012   Procedure: PERCUTANEOUS CORONARY STENT INTERVENTION (PCI-S);  Surgeon: Troy Sine, MD;  Location: [facility] CATH LAB;  Service: Cardiovascular;  Laterality: Right;   THUMB ARTHROSCOPY Left    thumb joint replaced    Allergies  Allergen Reactions   Hydrocodone Itching    Current Outpatient Medications  Medication Sig Dispense Refill   albuterol (PROVENTIL HFA;VENTOLIN HFA) 108 (90 Base) MCG/ACT inhaler Inhale 2 puffs into the lungs every 6 (six) hours as needed for wheezing or shortness of breath. 1 Inhaler 2   albuterol (PROVENTIL) (2.5 MG/3ML) 0.083% nebulizer solution Take 5 mg by nebulization every 4 (four) hours as  needed. For wheezing or SOB     amLODipine (NORVASC) 5 MG tablet TAKE ONE TABLET BY MOUTH DAILY 180 tablet 3   apixaban (Bobby [name]) 5 MG TABS tablet Take 1 tablet (5 mg total) by mouth 2 (two) times daily. 28 tablet 1   aspirin EC 81 MG tablet Take 1 tablet (81 mg total) by mouth daily. Swallow whole. 90 tablet 3   atorvastatin (LIPITOR) 80 MG tablet Take 1 tablet (80 mg total) by mouth daily at 6 PM. 90 tablet 3   benzonatate (TESSALON) 100 MG capsule Take 100-200 mg by mouth 3 (three) times daily as needed.     BREZTRI AEROSPHERE 160-9-4.8 MCG/ACT AERO Inhale 2 puffs into the lungs 2 (two) times daily.     buPROPion (WELLBUTRIN XL) 300 MG 24 hr tablet Take 300 mg by mouth daily.     carvedilol (COREG) 25 MG tablet TAKE ONE TABLET BY MOUTH TWICE DAILY 180 tablet 3   clonazePAM (KLONOPIN) 0.5 MG tablet Take 1 mg by mouth 2 (two) times daily.   0   Coenzyme Q10 (COQ-10) 100 MG CAPS Take 1 capsule by mouth daily.  COLCRYS 0.6 MG tablet Take 0.6 mg by mouth daily.  3   metFORMIN (GLUCOPHAGE-XR) 500 MG 24 hr tablet Take 500 mg by mouth daily.  3   nitroGLYCERIN (NITROSTAT) 0.4 MG SL tablet DISSOLVE 1 TABLET UNDER THE TONGUE EVERY 5 MINUTES AS NEEDED FOR CHEST PAIN. DO NOT EXCEED A TOTAL OF 3 DOSES IN 15 MINUTES. 90 tablet 3   Omega-3 Fatty Acids (FISH OIL PO) Take 1 tablet by mouth daily.     oxyCODONE-acetaminophen (PERCOCET) 10-325 MG per tablet Take 1 tablet by mouth 3 (three) times daily.     predniSONE (DELTASONE) 10 MG tablet Take 10 mg by mouth daily.     sertraline (ZOLOFT) 100 MG tablet Take 100 mg by mouth daily.     No current facility-administered medications for this visit.    Socially,  he is divorced. He doesn't walk but not routinely exercise.  He continues to smoke one pack of cigarettes per day.  ROS General: Negative; No fevers, chills, or night sweats;  HEENT: Negative; No changes in vision or hearing, sinus congestion, difficulty swallowing Pulmonary:  Positive for  shortness of breath; COPD Cardiovascular: See history of present illness GI: Negative; No nausea, vomiting, diarrhea, or abdominal pain GU: Negative; No dysuria, hematuria, or difficulty voiding Musculoskeletal: Negative; no myalgias, joint pain, or weakness Hematologic/Oncology: Negative; no easy bruising, bleeding Endocrine: Positive for diabetes no heat/cold intolerance;  Neuro: Negative; no changes in balance, headaches Skin: Negative; No rashes or skin lesions Psychiatric: Negative; No behavioral problems, depression Sleep: obstructive sleep apnea, CPAP initiated March 17, 2018; currently not on therapy. no bruxism, restless legs, hypnogognic hallucinations, no cataplexy Other comprehensive 14 point system review is negative.   PE BP 116/70   Pulse 62   Ht $R'5\' 9"'eo$  (1.753 m)   Wt 252 lb 9.6 oz (114.6 kg)   SpO2 95%   BMI 37.30 kg/m    Repeat blood pressure by me was 114/70  Wt Readings from Last 3 Encounters:  07/17/21 252 lb 9.6 oz (114.6 kg)  06/14/21 259 lb 3.2 oz (117.6 kg)  05/11/21 256 lb 6.4 oz (116.3 kg)     Physical Exam BP 116/70   Pulse 62   Ht $R'5\' 9"'bi$  (1.753 m)   Wt 252 lb 9.6 oz (114.6 kg)   SpO2 95%   BMI 37.30 kg/m  General: Alert, oriented, no distress.  Skin: normal turgor, no rashes, warm and dry HEENT: Normocephalic, atraumatic. Pupils equal round and reactive to light; sclera anicteric; extraocular muscles intact;  Nose without nasal septal hypertrophy Mouth/Parynx benign; Mallinpatti scale 3 Neck: No JVD, no carotid bruits; normal carotid upstroke Lungs: clear to ausculatation and percussion; no wheezing or rales Chest wall: without tenderness to palpitation Heart: PMI not displaced, RRR, s1 s2 normal, 1/6 systolic murmur, no diastolic murmur, no rubs, gallops, thrills, or heaves Abdomen: soft, nontender; no hepatosplenomehaly, BS+; abdominal aorta nontender and not dilated by palpation. Back: no CVA tenderness Pulses 2+ Musculoskeletal: full  range of motion, normal strength, no joint deformities Extremities: no clubbing cyanosis or edema, Homan's sign negative  Neurologic: grossly nonfocal; Cranial nerves grossly wnl Psychologic: Normal mood and affect   ECG (independently read by me):  NSR at 62, LAD, RBBB, old inferior and anterior infarct  May 11, 2021 ECG (independently read by me): Normal sinus rhythm at 76 bpm, right bundle branch block, left anterior hemiblock.  No ectopy.  Anteroseptal Q waves consistent with prior anterior MI  May 02, 2020 ECG (independently  read by me): Normal sinus rhythm at 69 bpm, bifascicular block with right bundle branch block and left anterior hemiblock.  On anteroseptal Q waves and inferior Q waves consistent with prior MIs.  Mild T wave abnormality laterally.  QTc interval 432 ms.     May 21, 2021ECG (independently read by me): Sinus bradycarrdia at 57; RBBB, Q V1-3 ands avF  July 24, 2019 ECG (independently read by me): Normal sinus rhythm at 80 bpm.  Right bundle branch block; previously noted Q waves V1 through V4 and 2 3 and F consistent with prior infarct  October 15, 2018 ECG (independently read by me): Normal sinus rhythm at 93 bpm with baseline artifact.  Right bundle branch block.  Anterior Q waves consistent with prior infarct.  Inferior Q waves.  ECG (independently read by me): Sinus rhythm at 65 bpm.  PAC.  Right bundle branch block, inferior Q waves in 3 and aVF, old anterior myocardial infarction.  Lateral T wave abnormality.  Normal intervals.  June 16, 2018 ECG (independently read by me): Normal sinus rhythm at 84 bpm.  Right bundle branch block with repolarization changes.  Inferior Q waves consistent with prior infarct.  Anteroseptal Q waves consistent with prior anterior MI.  May 28, 2018 ECG (independently read by me): Normal sinus rhythm with sinus arrhythmia.  Right bundle branch block with repolarization changes.  Q waves consistent with anteroseptal  MI and possible inferior MI no indication to adjust the pain upon his needs  July 17,2019 ECG (independently read by me): Normal sinus rhythm 89 bpm.  Right bundle branch block with repolarization changes.  Inferior Q waves and anterior  Q waves consistent with anterolateral infarct with possible inferior infarct  February 21, 2018 ECG (independently read by me): Normal sinus rhythm at 95 bpm, right bundle branch block with repolarization changes.  Anterolateral MI.  QTc interval 477 ms  Jan 24, 2018 ECG (independently read by me): Sinus rhythm at 97 bpm.  Right bundle branch block with repolarization changes.  QTc interval 472 ms.  Anterolateral Q waves  Jan 07, 2018 ECG (independently read by me): Normal sinus rhythm at 88 bpm.  Right bundle branch block with repolarization changes.  Inferior Q waves.  T wave anteroseptally, possible anteroseptal MI undetermined.  November 2017 ECG (independently read by me): Normal sinus rhythm at 64 bpm.  Branch block with repolarization changes.  QTc interval 460 ms.  June 2016 ECG (independently read by me): Sinus bradycardia at 57 bpm, right bundle branch block with repositioning changes.  QTc interval 459 ms.  September 2015 ECG (independently read by me and (: Normal sinus rhythm.  Right bundle branch block with repolarization changes.  Ventricular rate 81.  02/25/2014 ECG Normal sinus rhythm.  Right bundle branch block with repolarization changes.  PR interval 148 ms; QTc interval 463 ms  Prior ECG: sinus rhythm at 76 beats per minute; right bundle-branch block with repolarization changes. PR interval 142 ms, QTC 481 ms.   LABS:  BMP Latest Ref Rng & Units 07/20/2021 07/10/2021 06/14/2021  Glucose 70 - 99 mg/dL 98 164(H) 93  BUN 8 - 27 mg/dL 45(H) 44(H) 37(H)  Creatinine 0.76 - 1.27 mg/dL 2.11(H) 2.36(H) 1.99(H)  BUN/Creat Ratio 10 - $Re'24 21 19 19  'POl$ Sodium 134 - 144 mmol/L 136 138 138  Potassium 3.5 - 5.2 mmol/L 5.2 5.5(H) 5.5(H)  Chloride 96 - 106  mmol/L 105 108(H) 106  CO2 20 - 29 mmol/L 16(L) 21 16(L)  Calcium 8.6 -  10.2 mg/dL 9.9 10.3(H) 10.2   Hepatic Function Latest Ref Rng & Units 05/30/2021 11/05/2019 01/13/2018  Total Protein 6.0 - 8.5 g/dL 6.5 6.5 6.6  Albumin 3.7 - 4.7 g/dL 4.3 3.9 4.2  AST 0 - 40 IU/L $Remov'17 17 22  'DfBXHo$ ALT 0 - 44 IU/L $Remov'18 22 30  'MDXOuh$ Alk Phosphatase 44 - 121 IU/L 109 94 129(H)  Total Bilirubin 0.0 - 1.2 mg/dL 0.3 0.7 <0.2   CBC Latest Ref Rng & Units 05/30/2021 11/05/2019 01/21/2019  WBC 3.4 - 10.8 x10E3/uL 8.4 7.2 8.1  Hemoglobin 13.0 - 17.7 g/dL 13.8 13.9 14.1  Hematocrit 37.5 - 51.0 % 42.3 44.3 40.5  Platelets 150 - 450 x10E3/uL 179 156 182   Lab Results  Component Value Date   MCV 90 05/30/2021   MCV 94.3 11/05/2019   MCV 85 01/21/2019   Lab Results  Component Value Date   TSH 0.878 05/30/2021   Lipid Panel     Component Value Date/Time   CHOL 141 05/30/2021 1050   TRIG 133 05/30/2021 1050   HDL 40 05/30/2021 1050   CHOLHDL 3.5 05/30/2021 1050   CHOLHDL 4.8 08/09/2017 1117   VLDL 52 (H) 08/09/2017 1117   LDLCALC 77 05/30/2021 1050   INR result since initiation of Coumadin: 1:> 7.6;> >10;> 8.6 (Vit K administered) >4.3; no longer on Coumadin, now on Bobby [name]  RADIOLOGY: No results found.  IMPRESSION: 1. Chronic combined systolic and diastolic heart failure ([place])   2. Ischemic cardiomyopathy   3. Coronary artery disease involving native coronary artery of native heart with other form of angina pectoris ([place])   4. Hyperlipidemia LDL goal <70   5. OSA (obstructive sleep apnea)   6. Long term (current) use of anticoagulants   7. CKD (chronic kidney disease) stage 4, GFR 15-29 ml/min (HCC)   8. RBBB   9. Tobacco abuse    ASSESSMENT AND PLAN:  Mr. Quintela is a 75 year old Caucasian male who suffered an acute coronary syndrome on 08/04/2012 when he presented with subtotal occlusion of his RCA. He had diffuse disease beyond the subtotal occlusion and ultimately had successful insertion of a 3.25x32 mm  Promus DES stent. He had concomitant CAD with 40-50% proximal LAD narrowing, 30% circumflex marginal stenoses. NMR lipoprofile after initiation of atorvastatin in January 2014 showed marked improvement in total cholesterol of 119 LDL 48 LDL particle #992. HDL was very low at 30 with an increased triglyceride at 203 and significantly reduced HDL particle #24.2. His insulin resistance score was elevated at 65.  He suffered an anterior STEMI in December 2018 and underwent successful stenting and several days later he required staged intervention to circumflex and marginal vessel. 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  Warfarin was initially prescribed but due to significant issues with compliance and overmedication the decision was made that he would be safer using Bobby [name] even though he is on this for apical thrombus rather than atrial fibrillation.   He was subsequently found to have significant obstructive sleep apnea but stopped therapy and now usesnocturnal oxygen.  His echo Doppler study in June 2021 continue to show an EF at 35 to 40% with moderate dilation of LV internal dimensions.  There was mild LVH and grade 1 diastolic dysfunction.  There was evidence for moderate aortic sclerosis with mild mitral annular calcification.  There was no evidence for his previous apical thrombus.  I reviewed his most recent echo Doppler study from June 05, 2021 which showed an EF at 30 to 35% with previously noted septal and apical akinesis and inferior wall hypokinesis.  There was mild mitral regurgitation and mild to moderate aortic valve sclerosis without stenosis.  He has been on anticoagulation therapy with Bobby [name].  Recent laboratory had shown increasing creatinine and elevated potassium.  He was advised to discontinue spironolactone and hold his losartan with plans for follow-up lab work in several days following this office visit.  If potassium had normalized and renal function  is better perhaps low-dose losartan could be resumed.  At his last office visit we had discussed potential transition to [facility].  Unfortunately he continues to smoke and has been smoking for 65 years and started at age 54.  He continues to be on atorvastatin 80 mg for hyperlipidemia.  LDL cholesterol was 77 in September 2022.  I will contact him regarding his follow-up laboratory and further recommendations will be made at that time.  If renal function is worse, nephrology evaluation will most likely be recommended.   Troy Sine, MD, [facility]  07/23/2021 8:17 PM

## 2021-07-20 ENCOUNTER — Telehealth: Payer: Self-pay | Admitting: Cardiovascular Disease

## 2021-07-20 LAB — BASIC METABOLIC PANEL
BUN/Creatinine Ratio: 21 (ref 10–24)
BUN: 45 mg/dL — ABNORMAL HIGH (ref 8–27)
CO2: 16 mmol/L — ABNORMAL LOW (ref 20–29)
Calcium: 9.9 mg/dL (ref 8.6–10.2)
Chloride: 105 mmol/L (ref 96–106)
Creatinine, Ser: 2.11 mg/dL — ABNORMAL HIGH (ref 0.76–1.27)
Glucose: 98 mg/dL (ref 70–99)
Potassium: 5.2 mmol/L (ref 3.5–5.2)
Sodium: 136 mmol/L (ref 134–144)
eGFR: 32 mL/min/{1.73_m2} — ABNORMAL LOW (ref >59)

## 2021-07-20 NOTE — Telephone Encounter (Signed)
Dr. Maebelle Munroe. Darron Doom is requesting the most recent office notes from pt's last visit on 07/17/21 with Dr. Claiborne Billings faxed to her office [phone]

## 2021-07-20 NOTE — Telephone Encounter (Signed)
Routed office note to fax number given [phone]

## 2021-07-23 ENCOUNTER — Encounter: Payer: Self-pay | Admitting: Cardiovascular Disease

## 2021-08-01 ENCOUNTER — Other Ambulatory Visit: Payer: Self-pay | Admitting: *Deleted

## 2021-08-01 DIAGNOSIS — F17210 Nicotine dependence, cigarettes, uncomplicated: Secondary | ICD-10-CM

## 2021-08-01 DIAGNOSIS — Z87891 Personal history of nicotine dependence: Secondary | ICD-10-CM

## 2021-08-22 ENCOUNTER — Other Ambulatory Visit: Payer: Self-pay

## 2021-08-22 ENCOUNTER — Ambulatory Visit (INDEPENDENT_AMBULATORY_CARE_PROVIDER_SITE_OTHER): Payer: Medicare Other | Admitting: Acute Care

## 2021-08-22 ENCOUNTER — Encounter: Payer: Self-pay | Admitting: Acute Care

## 2021-08-22 DIAGNOSIS — F17210 Nicotine dependence, cigarettes, uncomplicated: Secondary | ICD-10-CM

## 2021-08-22 NOTE — Patient Instructions (Signed)
Thank you for participating in the [facility] Cancer Screening Program. °It was our pleasure to meet you today. °We will call you with the results of your scan within the next few days. °Your scan will be assigned a Lung RADS category score by the physicians reading the scans.  °This Lung RADS score determines follow up scanning.  °See below for description of categories, and follow up screening recommendations. °We will be in touch to schedule your follow up screening annually or based on recommendations of our providers. °We will fax a copy of your scan results to your Primary Care Physician, or the physician who referred you to the program, to ensure they have the results. °Please call the office if you have any questions or concerns regarding your scanning experience or results.  °Our office number is [phone]. °Please speak with Denise Phelps, RN. She is our Lung Cancer Screening RN. °If she is unavailable when you call, please have the office staff send her a message. She will return your call at her earliest convenience. °Remember, if your scan is normal, we will scan you annually as long as you continue to meet the criteria for the program. (Age 55-77, Current smoker or smoker who has quit within the last 15 years). °If you are a smoker, remember, quitting is the single most powerful action that you can take to decrease your risk of lung cancer and other pulmonary, breathing related problems. °We know quitting is hard, and we are here to help.  °Please let us know if there is anything we can do to help you meet your goal of quitting. °If you are a former smoker, congratulations. We are proud of you! Remain smoke free! °Remember you can refer friends or family members through the number above.  °We will screen them to make sure they meet criteria for the program. °Thank you for helping us take better care of you by participating in Lung Screening. ° °You can receive free nicotine replacement therapy  ( patches, gum or mints) by calling 1-800-QUIT NOW. Please call so we can get you on the path to becoming  a non-smoker. I know it is hard, but you can do this! ° °Lung RADS Categories: ° °Lung RADS 1: no nodules or definitely non-concerning nodules.  °Recommendation is for a repeat annual scan in 12 months. ° °Lung RADS 2:  nodules that are non-concerning in appearance and behavior with a very low likelihood of becoming an active cancer. °Recommendation is for a repeat annual scan in 12 months. ° °Lung RADS 3: nodules that are probably non-concerning , includes nodules with a low likelihood of becoming an active cancer.  Recommendation is for a 6-month repeat screening scan. Often noted after an upper respiratory illness. We will be in touch to make sure you have no questions, and to schedule your 6-month scan. ° °Lung RADS 4 A: nodules with concerning findings, recommendation is most often for a follow up scan in 3 months or additional testing based on our provider's assessment of the scan. We will be in touch to make sure you have no questions and to schedule the recommended 3 month follow up scan. ° °Lung RADS 4 B:  indicates findings that are concerning. We will be in touch with you to schedule additional diagnostic testing based on our provider's  assessment of the scan. ° °Hypnosis for smoking cessation  °[employer] °[phone] ° °Acupuncture for smoking cessation  °[facility] °[phone]  °

## 2021-08-22 NOTE — Progress Notes (Signed)
Virtual Visit via Telephone Note  I connected with Bobby [name] on 07/18/21 at  2:00 PM EST by telephone and verified that I am speaking with the correct person using two identifiers.  Location: Patient: Home Provider: Working from home   I discussed the limitations, risks, security and privacy concerns of performing an evaluation and management service by telephone and the availability of in person appointments. I also discussed with the patient that there may be a patient responsible charge related to this service. The patient expressed understanding and agreed to proceed.  Shared Decision Making Visit Lung Cancer Screening Program [phone])   Eligibility: Age 75 y.o. Pack Years Smoking History Calculation 45 (# packs/per year x # years smoked) Recent History of coughing up blood  no Unexplained weight loss? no ( >Than 15 pounds within the last 6 months ) Prior History Lung / other cancer no (Diagnosis within the last 5 years already requiring surveillance chest CT Scans). Smoking Status Current Smoker Former Smokers: Years since quit: NA  Quit Date: NA  Visit Components: Discussion included one or more decision making aids. yes Discussion included risk/benefits of screening. yes Discussion included potential follow up diagnostic testing for abnormal scans. yes Discussion included meaning and risk of over diagnosis. yes Discussion included meaning and risk of False Positives. yes Discussion included meaning of total radiation exposure. yes  Counseling Included: Importance of adherence to annual lung cancer LDCT screening. yes Impact of comorbidities on ability to participate in the program. yes Ability and willingness to under diagnostic treatment. yes  Smoking Cessation Counseling: Current Smokers:  Discussed importance of smoking cessation. yes Information about tobacco cessation classes and interventions provided to patient. yes Patient provided with "ticket" for LDCT  Scan. yes Symptomatic Patient. yes  Counseling(Intermediate counseling: > three minutes) 99406 Diagnosis Code: Tobacco Use Z72.0 Asymptomatic Patient no  Counseling NA Former Smokers:  Discussed the importance of maintaining cigarette abstinence. yes Diagnosis Code: Personal History of Nicotine Dependence. V61.607 Information about tobacco cessation classes and interventions provided to patient. Yes Patient provided with "ticket" for LDCT Scan. yes Written Order for Lung Cancer Screening with LDCT placed in Epic. Yes (CT Chest Lung Cancer Screening Low Dose W/O CM) [id number] Z12.2-Screening of respiratory organs Z87.891-Personal history of nicotine dependence   I spent 25 minutes of face to face time with him discussing the risks and benefits of lung cancer screening. We viewed a power point together that explained in detail the above noted topics. We took the time to pause the power point at intervals to allow for questions to be asked and answered to ensure understanding. We discussed that he had taken the single most powerful action possible to decrease His risk of developing lung cancer when He quit smoking. I counseled him to remain smoke free, and to contact me if he ever had the desire to smoke again so that I can provide resources and tools to help support the effort to remain smoke free. We discussed the time and location of the scan, and that either  Doroteo Glassman RN or I will call with the results within  24-48 hours of receiving them. He has my card and contact information in the event he needs to speak with me, in addition to a copy of the power point we reviewed as a resource. He verbalized understanding of all of the above and had no further questions upon leaving the office.    I explained to the patient that there has been a high  incidence of coronary artery disease noted on these exams. I explained that this is a non-gated exam therefore degree or severity cannot be determined.  This patient is on statin therapy. I have asked the patient to follow-up with their PCP regarding any incidental finding of coronary artery disease and management with diet or medication as they feel is clinically indicated. The patient verbalized understanding of the above and had no further questions.  Labrisha Wuellner D. Kenton Kingfisher, NP-C [facility] Personal contact information can be found on Amion  08/22/2021, 10:19 AM

## 2021-08-23 ENCOUNTER — Ambulatory Visit
Admission: RE | Admit: 2021-08-23 | Discharge: 2021-08-23 | Disposition: A | Discharge status: Home / Self Care | Payer: Medicare Other | Source: Ambulatory Visit | Attending: Acute Care | Admitting: Acute Care

## 2021-08-23 DIAGNOSIS — Z87891 Personal history of nicotine dependence: Secondary | ICD-10-CM

## 2021-08-23 DIAGNOSIS — F17210 Nicotine dependence, cigarettes, uncomplicated: Secondary | ICD-10-CM

## 2021-08-30 ENCOUNTER — Other Ambulatory Visit: Payer: Self-pay | Admitting: Acute Care

## 2021-08-30 DIAGNOSIS — Z87891 Personal history of nicotine dependence: Secondary | ICD-10-CM

## 2021-08-30 DIAGNOSIS — F17210 Nicotine dependence, cigarettes, uncomplicated: Secondary | ICD-10-CM

## 2021-09-03 ENCOUNTER — Encounter (HOSPITAL_BASED_OUTPATIENT_CLINIC_OR_DEPARTMENT_OTHER): Payer: Self-pay | Admitting: Emergency Medicine

## 2021-09-03 ENCOUNTER — Emergency Department (HOSPITAL_BASED_OUTPATIENT_CLINIC_OR_DEPARTMENT_OTHER): Payer: Medicare Other

## 2021-09-03 ENCOUNTER — Emergency Department (HOSPITAL_BASED_OUTPATIENT_CLINIC_OR_DEPARTMENT_OTHER)
Admission: EM | Admit: 2021-09-03 | Discharge: 2021-09-03 | Disposition: A | Discharge status: Home / Self Care | Payer: Medicare Other | Attending: Emergency Medicine | Admitting: Emergency Medicine

## 2021-09-03 ENCOUNTER — Other Ambulatory Visit: Payer: Self-pay

## 2021-09-03 DIAGNOSIS — Z20822 Contact with and (suspected) exposure to covid-19: Secondary | ICD-10-CM | POA: Diagnosis not present

## 2021-09-03 DIAGNOSIS — J189 Pneumonia, unspecified organism: Secondary | ICD-10-CM | POA: Diagnosis not present

## 2021-09-03 DIAGNOSIS — Z7984 Long term (current) use of oral hypoglycemic drugs: Secondary | ICD-10-CM | POA: Insufficient documentation

## 2021-09-03 DIAGNOSIS — Z79899 Other long term (current) drug therapy: Secondary | ICD-10-CM | POA: Diagnosis not present

## 2021-09-03 DIAGNOSIS — J449 Chronic obstructive pulmonary disease, unspecified: Secondary | ICD-10-CM | POA: Insufficient documentation

## 2021-09-03 DIAGNOSIS — I509 Heart failure, unspecified: Secondary | ICD-10-CM | POA: Insufficient documentation

## 2021-09-03 DIAGNOSIS — Z7982 Long term (current) use of aspirin: Secondary | ICD-10-CM | POA: Insufficient documentation

## 2021-09-03 DIAGNOSIS — R0602 Shortness of breath: Secondary | ICD-10-CM | POA: Diagnosis present

## 2021-09-03 LAB — BASIC METABOLIC PANEL
Anion gap: 9 (ref 5–15)
BUN: 28 mg/dL — ABNORMAL HIGH (ref 8–23)
CO2: 22 mmol/L (ref 22–32)
Calcium: 9.7 mg/dL (ref 8.9–10.3)
Chloride: 107 mmol/L (ref 98–111)
Creatinine, Ser: 1.64 mg/dL — ABNORMAL HIGH (ref 0.61–1.24)
GFR, Estimated: 43 mL/min — ABNORMAL LOW (ref >60)
Glucose, Bld: 110 mg/dL — ABNORMAL HIGH (ref 70–99)
Potassium: 3.8 mmol/L (ref 3.5–5.1)
Sodium: 138 mmol/L (ref 135–145)

## 2021-09-03 LAB — CBC WITH DIFFERENTIAL/PLATELET
Abs Immature Granulocytes: 0.03 10*3/uL (ref 0.00–0.07)
Basophils Absolute: 0.1 10*3/uL (ref 0.0–0.1)
Basophils Relative: 1 %
Eosinophils Absolute: 0.5 10*3/uL (ref 0.0–0.5)
Eosinophils Relative: 5 %
HCT: 40.8 % (ref 39.0–52.0)
Hemoglobin: 13.3 g/dL (ref 13.0–17.0)
Immature Granulocytes: 0 %
Lymphocytes Relative: 16 %
Lymphs Abs: 1.6 10*3/uL (ref 0.7–4.0)
MCH: 29.2 pg (ref 26.0–34.0)
MCHC: 32.6 g/dL (ref 30.0–36.0)
MCV: 89.7 fL (ref 80.0–100.0)
Monocytes Absolute: 0.6 10*3/uL (ref 0.1–1.0)
Monocytes Relative: 6 %
Neutro Abs: 6.9 10*3/uL (ref 1.7–7.7)
Neutrophils Relative %: 72 %
Platelets: 172 10*3/uL (ref 150–400)
RBC: 4.55 MIL/uL (ref 4.22–5.81)
RDW: 14.2 % (ref 11.5–15.5)
WBC: 9.6 10*3/uL (ref 4.0–10.5)
nRBC: 0 % (ref 0.0–0.2)

## 2021-09-03 LAB — RESP PANEL BY RT-PCR (FLU A&B, COVID) ARPGX2
Influenza A by PCR: NEGATIVE
Influenza B by PCR: NEGATIVE
SARS Coronavirus 2 by RT PCR: NEGATIVE

## 2021-09-03 LAB — TROPONIN I (HIGH SENSITIVITY)
Troponin I (High Sensitivity): 15 ng/L (ref <18)
Troponin I (High Sensitivity): 16 ng/L (ref <18)

## 2021-09-03 LAB — BRAIN NATRIURETIC PEPTIDE: B Natriuretic Peptide: 786.2 pg/mL — ABNORMAL HIGH (ref 0.0–100.0)

## 2021-09-03 MED ORDER — DOXYCYCLINE HYCLATE 100 MG PO CAPS: 100.0000 mg | ORAL_CAPSULE | Freq: Two times a day (BID) | ORAL | 0 refills | Status: DC

## 2021-09-03 MED ORDER — IPRATROPIUM-ALBUTEROL 0.5-2.5 (3) MG/3ML IN SOLN
3.0000 mL | Freq: Once | RESPIRATORY_TRACT | Status: AC
  Administered 2021-09-03: 3 mL via RESPIRATORY_TRACT
  Filled 2021-09-03: qty 3

## 2021-09-03 MED ORDER — FUROSEMIDE 10 MG/ML IJ SOLN
20.0000 mg | Freq: Once | INTRAMUSCULAR | Status: AC
  Administered 2021-09-03: 20 mg via INTRAVENOUS
  Filled 2021-09-03: qty 2

## 2021-09-03 NOTE — ED Triage Notes (Addendum)
Pt reports shortness of breath for the past few days accompanied by nonproductive cough. Worse with walking and at night.

## 2021-09-03 NOTE — ED Notes (Signed)
Ambulated from lobby to room 6, SpO2 92-93, +DOE, congested cough, HR 95, BBS rhonchi/wheezes, last treatment ~3am.

## 2021-09-03 NOTE — Discharge Instructions (Addendum)
You were seen in the emergency department for increased cough and shortness of breath.  Your x-ray showed possible pneumonia.  Your heart failure number was also slightly elevated and you were given a dose of diuretic.  Please finish your antibiotics.  Limit tobacco.  Follow-up with your primary care doctor and your cardiologist.  Return to the emergency department if any worsening or concerning symptoms.

## 2021-09-03 NOTE — ED Provider Notes (Signed)
[facility] EMERGENCY DEPARTMENT Provider Note   CSN: [id number] Arrival date & time: 09/03/21  0935     History  Chief Complaint  Patient presents with   Shortness of Breath    Bobby [name] is a 76 y.o. male.  He has a history of tobacco abuse COPD CHF.  Complaining of 4 days of productive cough with white sputum.  Worse at night and worse laying down flat.  He denies any fever or chest pain.  He is on inhalers last treatment around 3 AM.  No hemoptysis.  No nausea vomiting diarrhea.  No increased leg swelling.  He has been taking his medications as prescribed.  The history is provided by the patient.  Shortness of Breath Severity:  Moderate Onset quality:  Gradual Duration:  4 days Timing:  Intermittent Progression:  Unchanged Chronicity:  Recurrent Relieved by:  Nothing Worsened by:  Activity and exertion Ineffective treatments:  Oxygen, diuretics, rest and sitting up Associated symptoms: cough, sputum production and wheezing   Associated symptoms: no abdominal pain, no chest pain, no diaphoresis, no fever, no headaches, no hemoptysis, no rash, no sore throat and no vomiting   Risk factors: tobacco use       Home Medications Prior to Admission medications   Medication Sig Start Date End Date Taking? Authorizing Provider  predniSONE (DELTASONE) 10 MG tablet Take 10 mg by mouth daily. 11/05/20  Yes [provider]  albuterol (PROVENTIL HFA;VENTOLIN HFA) 108 (90 Base) MCG/ACT inhaler Inhale 2 puffs into the lungs every 6 (six) hours as needed for wheezing or shortness of breath. 08/21/17   Bhagat, Crista Luria, PA  albuterol (PROVENTIL) (2.5 MG/3ML) 0.083% nebulizer solution Take 5 mg by nebulization every 4 (four) hours as needed. For wheezing or SOB    [provider]  amLODipine (NORVASC) 5 MG tablet TAKE ONE TABLET BY MOUTH DAILY 07/04/21   Troy Sine, MD  apixaban (ELIQUIS) 5 MG TABS tablet Take 1 tablet (5 mg total) by mouth 2 (two) times  daily. 02/19/19   Troy Sine, MD  aspirin EC 81 MG tablet Take 1 tablet (81 mg total) by mouth daily. Swallow whole. 11/09/20   Erlene Quan, PA-C  atorvastatin (LIPITOR) 80 MG tablet Take 1 tablet (80 mg total) by mouth daily at 6 PM. 03/11/18   Troy Sine, MD  benzonatate (TESSALON) 100 MG capsule Take 100-200 mg by mouth 3 (three) times daily as needed. 01/03/20   [provider]  BREZTRI AEROSPHERE 160-9-4.8 MCG/ACT AERO Inhale 2 puffs into the lungs 2 (two) times daily. 01/06/20   [provider]  buPROPion (WELLBUTRIN XL) 300 MG 24 hr tablet Take 300 mg by mouth daily.    [provider]  carvedilol (COREG) 25 MG tablet TAKE ONE TABLET BY MOUTH TWICE DAILY 07/04/21   Troy Sine, MD  clonazePAM (KLONOPIN) 0.5 MG tablet Take 1 mg by mouth 2 (two) times daily.  06/01/17   [provider]  Coenzyme Q10 (COQ-10) 100 MG CAPS Take 1 capsule by mouth daily.     [provider]  COLCRYS 0.6 MG tablet Take 0.6 mg by mouth daily. 03/21/17   [provider]  metFORMIN (GLUCOPHAGE-XR) 500 MG 24 hr tablet Take 500 mg by mouth daily. 03/21/17   [provider]  nitroGLYCERIN (NITROSTAT) 0.4 MG SL tablet DISSOLVE 1 TABLET UNDER THE TONGUE EVERY 5 MINUTES AS NEEDED FOR CHEST PAIN. DO NOT EXCEED A TOTAL OF 3 DOSES IN  15 MINUTES. 07/04/21   Troy Sine, MD  Omega-3 Fatty Acids (FISH OIL PO) Take 1 tablet by mouth daily.    [provider]  oxyCODONE-acetaminophen (PERCOCET) 10-325 MG per tablet Take 1 tablet by mouth 3 (three) times daily.    [provider]  sertraline (ZOLOFT) 100 MG tablet Take 100 mg by mouth daily.    [provider]      Allergies    Hydrocodone    Review of Systems   Review of Systems  Constitutional:  Negative for diaphoresis and fever.  HENT:  Negative for sore throat.   Eyes:  Negative for visual disturbance.  Respiratory:  Positive for cough, sputum production, shortness of breath  and wheezing. Negative for hemoptysis.   Cardiovascular:  Negative for chest pain.  Gastrointestinal:  Negative for abdominal pain and vomiting.  Genitourinary:  Negative for dysuria.  Musculoskeletal:  Negative for myalgias.  Skin:  Negative for rash.  Neurological:  Negative for headaches.   Physical Exam Updated Vital Signs BP (!) 156/84    Pulse 96    Temp (!) 97.4 F (36.3 C) (Oral)    Resp 16    Ht 5\' 9"  (1.753 m)    Wt 116.6 kg    SpO2 94%    BMI 37.95 kg/m  Physical Exam Vitals and nursing note reviewed.  Constitutional:      General: He is not in acute distress.    Appearance: He is well-developed. He is obese.  HENT:     Head: Normocephalic and atraumatic.  Eyes:     Conjunctiva/sclera: Conjunctivae normal.  Cardiovascular:     Rate and Rhythm: Normal rate and regular rhythm.     Heart sounds: No murmur heard. Pulmonary:     Effort: Pulmonary effort is normal. No respiratory distress.     Breath sounds: Wheezing (few scattered) present.  Abdominal:     Palpations: Abdomen is soft.     Tenderness: There is no abdominal tenderness. There is no guarding or rebound.  Musculoskeletal:        General: No swelling.     Cervical back: Neck supple.     Right lower leg: No tenderness. No edema.     Left lower leg: No tenderness. No edema.  Skin:    General: Skin is warm and dry.     Capillary Refill: Capillary refill takes less than 2 seconds.  Neurological:     General: No focal deficit present.     Mental Status: He is alert.  Psychiatric:        Mood and Affect: Mood normal.    ED Results / Procedures / Treatments   Labs (all labs ordered are listed, but only abnormal results are displayed) Labs Reviewed  BASIC METABOLIC PANEL - Abnormal; Notable for the following components:      Result Value   Glucose, Bld 110 (*)    BUN 28 (*)    Creatinine, Ser 1.64 (*)    GFR, Estimated 43 (*)    All other components within normal limits  BRAIN NATRIURETIC PEPTIDE -  Abnormal; Notable for the following components:   B Natriuretic Peptide 786.2 (*)    All other components within normal limits  RESP PANEL BY RT-PCR (FLU A&B, COVID) ARPGX2  CBC WITH DIFFERENTIAL/PLATELET  TROPONIN I (HIGH SENSITIVITY)  TROPONIN I (HIGH SENSITIVITY)    EKG EKG Interpretation  Date/Time:  Sunday September 03 2021 09:47:55 EST Ventricular Rate:  64 PR Interval:  198  QRS Duration: 169 QT Interval:  446 QTC Calculation: 461 R Axis:   -81 Text Interpretation: Sinus rhythm Right bundle branch block new axis change from prior 3/21 Confirmed by Aletta Edouard [phone]) on 09/03/2021 9:51:11 AM  Radiology DG Chest Port 1 View  Result Date: 09/03/2021 CLINICAL DATA:  Shortness of breath and cough for 2 days. EXAM: PORTABLE CHEST 1 VIEW COMPARISON:  November 05, 2019 FINDINGS: The heart size and mediastinal contours are within normal limits. There is mild opacity of the medial right lung base. There is no pleural effusion or pulmonary edema. The visualized skeletal structures are stable. IMPRESSION: Mild opacity of the medial right lung base, suspicious for pneumonia. Electronically Signed   By: Abelardo Diesel M.D.   On: 09/03/2021 10:24    Procedures Procedures    Medications Ordered in ED Medications  ipratropium-albuterol (DUONEB) 0.5-2.5 (3) MG/3ML nebulizer solution 3 mL (3 mLs Nebulization Given 09/03/21 1010)  furosemide (LASIX) injection 20 mg (20 mg Intravenous Given 09/03/21 1057)    ED Course/ Medical Decision Making/ A&P Clinical Course as of 09/03/21 1723  Sun Sep 03, 2021  1012 Chest x-ray interpreted by me as no clear infiltrate.  Awaiting radiology reading. [MB]  1027 Radiology read the x-ray is suspicious for pneumonia. [MB]  3762 Patient states he is feeling better and is satting 93 to 95% on room air.  He does state he is still coughing though. [MB]  8315 Patient did a trending pulse ox and did well.  He feels ready for discharge.  Recommended he discuss with his  cardiologist regarding starting diuretic. [MB]    Clinical Course User Index [MB] Hayden Rasmussen, MD                           Medical Decision Making  SAVERIO KADER was evaluated in Emergency Department on 09/03/2021 for the symptoms described in the history of present illness. He was evaluated in the context of the global COVID-19 pandemic, which necessitated consideration that the patient might be at risk for infection with the SARS-CoV-2 virus that causes COVID-19. Institutional protocols and algorithms that pertain to the evaluation of patients at risk for COVID-19 are in a state of rapid change based on information released by regulatory bodies including the CDC and federal and state organizations. These policies and algorithms were followed during the patient's care in the ED.  This patient presents to the ED for concern of cough and shortness of breath, this involves an extensive number of treatment options, and is a complaint that carries with it a high risk of complications and morbidity.  The differential diagnosis includes COPD, CHF, pneumonia, COVID, flu, bronchitis, PE   Additional history obtained:  External records from outside source obtained and reviewed including prior cardiology notes   Lab Tests:  I Ordered, reviewed, and interpreted labs.  The pertinent results include: CBC with normal white count normal hemoglobin, chemistries with elevated BUN and creatinine although better than priors, COVID and flu negative, troponins flat, BNP elevated   Imaging Studies ordered:  I ordered imaging studies including chest x-ray I independently visualized and interpreted imaging which showed no clear infiltrates.  Radiology disagrees and feels there may be a right lower lobe infiltrate I agree with the radiologist interpretation   Cardiac Monitoring:  The patient was maintained on a cardiac monitor.  I personally viewed and interpreted the cardiac monitored which showed an  underlying rhythm of: Sinus bradycardia  Medicines ordered and prescription drug management:  I ordered medication including DuoNeb and IV Lasix for fluid overload Reevaluation of the patient after these medicines showed that the patient improved I have reviewed the patients home medicines and have made adjustments as needed   Problem List / ED Course:  Patient's breathing subjectively improved.  Remained satting well on room air.   Reevaluation:  After the interventions noted above, I reevaluated the patient and found that they have :improved   Dispostion:  After consideration of the diagnostic results and the patients response to treatment feel that the patent would benefit from close outpatient follow-up with his treating providers.  Did a trending pulse ox well patient is comfortable plan for discharge.  Will cover with antibiotics.  Recommended close follow-up with his cardiologist regarding restarting diuretics.  Counseled to eat a low-salt diet.  Counseled to stop smoking.  Return instructions discussed.          Final Clinical Impression(s) / ED Diagnoses Final diagnoses:  Community acquired pneumonia of right lower lobe of lung    Rx / DC Orders ED Discharge Orders          Ordered    doxycycline (VIBRAMYCIN) 100 MG capsule  2 times daily        09/03/21 1247              Hayden Rasmussen, MD 09/03/21 1727

## 2021-09-03 NOTE — ED Notes (Signed)
Ambulated on r/a SpO2 94-96%, HR 70, RR 24

## 2021-09-04 ENCOUNTER — Emergency Department (HOSPITAL_COMMUNITY): Payer: Medicare Other

## 2021-09-04 ENCOUNTER — Other Ambulatory Visit: Payer: Self-pay

## 2021-09-04 ENCOUNTER — Encounter (HOSPITAL_COMMUNITY): Payer: Self-pay | Admitting: Emergency Medicine

## 2021-09-04 ENCOUNTER — Emergency Department (HOSPITAL_COMMUNITY)
Admission: EM | Admit: 2021-09-04 | Discharge: 2021-09-05 | Discharge status: Against Medical Advice | Payer: Medicare Other | Source: Home / Self Care

## 2021-09-04 DIAGNOSIS — J449 Chronic obstructive pulmonary disease, unspecified: Secondary | ICD-10-CM | POA: Insufficient documentation

## 2021-09-04 DIAGNOSIS — Z5321 Procedure and treatment not carried out due to patient leaving prior to being seen by health care provider: Secondary | ICD-10-CM | POA: Insufficient documentation

## 2021-09-04 DIAGNOSIS — J9621 Acute and chronic respiratory failure with hypoxia: Secondary | ICD-10-CM | POA: Diagnosis not present

## 2021-09-04 DIAGNOSIS — R0602 Shortness of breath: Secondary | ICD-10-CM | POA: Insufficient documentation

## 2021-09-04 DIAGNOSIS — R059 Cough, unspecified: Secondary | ICD-10-CM | POA: Insufficient documentation

## 2021-09-04 DIAGNOSIS — A419 Sepsis, unspecified organism: Secondary | ICD-10-CM | POA: Diagnosis not present

## 2021-09-04 LAB — COMPREHENSIVE METABOLIC PANEL
ALT: 21 U/L (ref 0–44)
AST: 20 U/L (ref 15–41)
Albumin: 3.7 g/dL (ref 3.5–5.0)
Alkaline Phosphatase: 99 U/L (ref 38–126)
Anion gap: 7 (ref 5–15)
BUN: 28 mg/dL — ABNORMAL HIGH (ref 8–23)
CO2: 23 mmol/L (ref 22–32)
Calcium: 9.5 mg/dL (ref 8.9–10.3)
Chloride: 109 mmol/L (ref 98–111)
Creatinine, Ser: 1.87 mg/dL — ABNORMAL HIGH (ref 0.61–1.24)
GFR, Estimated: 37 mL/min — ABNORMAL LOW (ref >60)
Glucose, Bld: 137 mg/dL — ABNORMAL HIGH (ref 70–99)
Potassium: 5 mmol/L (ref 3.5–5.1)
Sodium: 139 mmol/L (ref 135–145)
Total Bilirubin: 0.9 mg/dL (ref 0.3–1.2)
Total Protein: 6.4 g/dL — ABNORMAL LOW (ref 6.5–8.1)

## 2021-09-04 LAB — CBC WITH DIFFERENTIAL/PLATELET
Abs Immature Granulocytes: 0.06 10*3/uL (ref 0.00–0.07)
Basophils Absolute: 0.1 10*3/uL (ref 0.0–0.1)
Basophils Relative: 1 %
Eosinophils Absolute: 0.1 10*3/uL (ref 0.0–0.5)
Eosinophils Relative: 1 %
HCT: 42.3 % (ref 39.0–52.0)
Hemoglobin: 13 g/dL (ref 13.0–17.0)
Immature Granulocytes: 1 %
Lymphocytes Relative: 11 %
Lymphs Abs: 1 10*3/uL (ref 0.7–4.0)
MCH: 29 pg (ref 26.0–34.0)
MCHC: 30.7 g/dL (ref 30.0–36.0)
MCV: 94.2 fL (ref 80.0–100.0)
Monocytes Absolute: 0.4 10*3/uL (ref 0.1–1.0)
Monocytes Relative: 4 %
Neutro Abs: 7.5 10*3/uL (ref 1.7–7.7)
Neutrophils Relative %: 82 %
Platelets: 183 10*3/uL (ref 150–400)
RBC: 4.49 MIL/uL (ref 4.22–5.81)
RDW: 13.9 % (ref 11.5–15.5)
WBC: 9.1 10*3/uL (ref 4.0–10.5)
nRBC: 0 % (ref 0.0–0.2)

## 2021-09-04 LAB — BRAIN NATRIURETIC PEPTIDE: B Natriuretic Peptide: 612.8 pg/mL — ABNORMAL HIGH (ref 0.0–100.0)

## 2021-09-04 NOTE — ED Notes (Signed)
PT opting to leave at this time with wife. IV removed.

## 2021-09-04 NOTE — ED Provider Notes (Signed)
Emergency Medicine Provider Triage Evaluation Note  Bobby [name] , a 76 y.o. male  was evaluated in triage.  Pt complains of cough and shortness of breath.  Was seen yesterday at [facility] and diagnosed with pneumonia.  He does have a history of COPD and is on 2 L nasal cannula at home.  He has not had any increase in oxygen requirements.  No chest pain, nausea, vomiting, diarrhea.  Was prescribed doxycycline yesterday.  Review of Systems  Positive:  Negative: See above   Physical Exam  BP (!) 154/81 (BP Location: Left Arm)    Pulse 74    Temp (!) 97 F (36.1 C) (Oral)    Resp 20    SpO2 98%  Gen:   Awake, no distress   Resp:  Normal effort  MSK:   Moves extremities without difficulty Other:    Medical Decision Making  Medically screening exam initiated at 6:36 PM.  Appropriate orders placed.  TARICK PARENTEAU was informed that the remainder of the evaluation will be completed by another provider, this initial triage assessment does not replace that evaluation, and the importance of remaining in the ED until their evaluation is complete.     Hendricks Limes, PA-C 09/04/21 1837    Jeanell Sparrow, DO 09/05/21 0011

## 2021-09-04 NOTE — ED Triage Notes (Signed)
Pt c/o increased cough and shortness of breath. Seen yesterday and diagnosed with pneumonia, hx COPD. Given 10mg  albuterol, 1mg  atrovent, and 125mg  solumedrol by EMS. SpO2 on home 2LNC 95%.

## 2021-09-05 ENCOUNTER — Ambulatory Visit (INDEPENDENT_AMBULATORY_CARE_PROVIDER_SITE_OTHER): Payer: Medicare Other | Admitting: Cardiovascular Disease

## 2021-09-05 ENCOUNTER — Encounter: Payer: Self-pay | Admitting: Cardiovascular Disease

## 2021-09-05 VITALS — BP 145/73 | HR 84 | Ht 69.0 in | Wt 258.6 lb

## 2021-09-05 DIAGNOSIS — I25118 Atherosclerotic heart disease of native coronary artery with other forms of angina pectoris: Secondary | ICD-10-CM | POA: Diagnosis not present

## 2021-09-05 DIAGNOSIS — I5042 Chronic combined systolic (congestive) and diastolic (congestive) heart failure: Secondary | ICD-10-CM | POA: Diagnosis not present

## 2021-09-05 DIAGNOSIS — I255 Ischemic cardiomyopathy: Secondary | ICD-10-CM | POA: Diagnosis not present

## 2021-09-05 DIAGNOSIS — E785 Hyperlipidemia, unspecified: Secondary | ICD-10-CM

## 2021-09-05 DIAGNOSIS — N1832 Chronic kidney disease, stage 3b: Secondary | ICD-10-CM

## 2021-09-05 DIAGNOSIS — G4733 Obstructive sleep apnea (adult) (pediatric): Secondary | ICD-10-CM

## 2021-09-05 DIAGNOSIS — Z7901 Long term (current) use of anticoagulants: Secondary | ICD-10-CM

## 2021-09-05 DIAGNOSIS — Z720 Tobacco use: Secondary | ICD-10-CM

## 2021-09-05 MED ORDER — HYDRALAZINE HCL 10 MG PO TABS: 10.0000 mg | ORAL_TABLET | Freq: Two times a day (BID) | ORAL | 6 refills | Status: DC

## 2021-09-05 NOTE — Patient Instructions (Addendum)
Medication Instructions:  START HYDRALAZINE 10MG  TWICE DAILY  *If you need a refill on your cardiac medications before your next appointment, please call your pharmacy*  Lab Work:   Testing/Procedures:  NONE    NONE  Follow-Up: Your next appointment:  4 month(s) In Person with Shelva Majestic, MD   At [facility], you and your health needs are our priority.  As part of our continuing mission to provide you with exceptional heart care, we have created designated Provider Care Teams.  These Care Teams include your primary Cardiologist (physician) and Advanced Practice Providers (APPs -  Physician Assistants and Nurse Practitioners) who all work together to provide you with the care you need, when you need it.

## 2021-09-05 NOTE — Progress Notes (Incomplete)
Patient ID: Bobby [name], male   DOB: 05/14/1946, 76 y.o.   MRN: [id number]    HPI: Bobby [name] is a 76 y.o. male presents to the office today for a 2 month followup evaluation.   Bobby [name]  has a long-standing tobacco history having started smoking at age 42, a history of hypertension, remote small TIA/CVA who presented to the hospital on 08/04/2012 in the setting of inferior ST segment elevation myocardial infarction. Catheterization by me revealed a 99% stenosis of the RCA and concomitant CAD involving his LAD and circumflex vessels. He underwent acute percutaneous coronary intervention with an excellent door to balloon time of only 20 minutes and ultimately insertion of a 3.25x32 mm DES stent post dilated 3.3. An echo done in the hospital showed an EF of 50-60% with mild basal inferior probable scar and moderate pulmonary hypertension with PA pressure of 45 mm.  When I saw Bobby [name] in May 2014, he did note shortness of breath with activity. He denied definitive chest pain. At that time, he realized that he had inadvertentlystopped taking the atorvastatin 40 mg dose and this was resumed. In addition, his Toprol dose was increased from 50 to 75 mg daily. A nuclear study showed an ejection fraction of 66%. Perfusion was essentially normal with exception of a small region of fixed inferoseptal bowel artifact and possible small area of inferobasilar scar.   Bobby [name] unfortunately continues to smoke at least a pack of cigarettes per day.   He does experience shortness of breath with activity.  He denies recent chest pain.  He has chronic right bundle branch block with repolarization changes.  He is diabetic and has pulmonary hypertension.  A follow-up nuclear perfusion study on 03/26/2014 remianed low risk and showed normal LV function and normal wall motion.  There was a fixed inferior defect that was felt most consistent with diaphragmatic attenuation.  There was no evidence for ischemia.  He underwent  prostate surgery by Bobby [name] without cardiovascular compromise.    A screening abdominal aortic ultrasound revealed a normal abdominal aorta.  Since I saw him in November 2017, he had an extensive history and has had recurrent hospitalizations he was hospitalized in October 2018 for left arm pain.  Troponin was negative.  In December 2018 he suffered an anterior ST segment elevation MI due to acute occlusion of his mid LAD and underwent successful PCI of his LAD with insertion of a 2.5 x 22 mm Resolute Onyx DES stent.  He was also found to have 99% dominant mid left circumflex lesion with 80% stenosis in the OM1 vessel and underwent staged intervention to the mid circumflex and marginal vessels.  Troponin was greater than 65.  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  There was mention of sludge is a sign of pre-thrombotic state but no definite thrombus was seen at the apex.  He returned to the hospital August 20, 2017 with malaise and dyspnea.  He was bradycardic.  He was diuresed.  He has renal insufficiency with creatinines increasing up to 1.9.  He has been followed by Bobby [name] on numerous occasions in the office.  Unfortunately continues to smoke cigarettes.  He admits to 1/2 pack/day.  He does note occasional dizziness as well as some shortness of breath.  He denies recurrent anginal type symptoms.   Suggestive of low flow state.  When I saw him on Jan 07, 2018  I recommended that he undergo a follow-up echo Doppler study to reassess systolic and diastolic function as well as potential for apical thrombus formation.  He had continued to be on aspirin and Brilinta following his STEMI.  He was on atorvastatin 80 mg for hyperlipidemia with target less than 70.  I had a long discussion regarding smoking cessation.  He underwent a follow-up echo Doppler study on Jan 20, 2018.  This showed reduced LV function with an EF of 35 to  40% with akinesis of the mid apical anterolateral and septal as well as apical walls.  There was grade 1 diastolic dysfunction.  There was now a possible small layered thrombus at the apex noted using Definity and also evidence of swirling at the LV apex suggestive of low flow state.   As result of the echo findings, I saw him in follow-up on Jan 24, 2018 at which time I had a very long discussion with him guarding potential thromboembolic stroke risk.  He was started on warfarin anticoagulation.  Apparently, the patient's medications have been in a  pack and had been followed by his girlfriend.  He was taking his medications correctly resulting in significant over anticoagulation with INR reached greater than 10.  He has been followed very closely by our pharmacy department and he also was given vitamin K.  Due to his poor medication compliance after much discussion ultimately the thought is for him to transition to Eliquis once his INR gets below 2.  He also has been taking his blood pressure medicines incorrectly.  Patient has been sleeping most of the day.  In the past I had recommended he undergo a sleep evaluation for sleep apnea which he never followed up with it was a no-show in the sleep lab.  He was worked into my schedule on February 21, 2018 and was seen as an add-on due to his medication issues.  He was here now with his sister who in the past had done an excellent job in caring for his medications and she is committed now to resume doing this for him.  During his evaluation, he was exceptionally somnolent, and a significant time was spent with medication adjustment.  Due to his inability to take effect of warfarin the decision was made to use Eliquis for anticoagulation with more consistent dosing.  His recent laboratory had shown stage IV chronic kidney disease and it was recommended that he hold his lisinopril for several days and then reduce the dose to 5 mg as well as holding his furosemide with  subsequent reduction of dose with reinstitution at 20 mg.  I scheduled him for an expeditious sleep study due to concerns for significant sleep apnea and he was seen5 days later by Bobby [name] with medication adjustment and anticoagulation issues.  He underwent a sleep study on February 25, 2018 and he was found to have moderate overall sleep apnea with an AHI of 22.2 and RDI of 25.4.  He could not achieve any rem sleep and the overall severity may very well be underestimated.  He was titrated up to 10 cm water pressure.  An initial trial of CPAP auto with an EPR range of 8-15 was recommended.  When I last saw him on March 19, 2018 he had he had just initiated CPAP the evening before noted significant improvement in his sleep. His renal function had significantly improved from a creatinine of 2.7 down to 1.362 weeks ago.  He is unaware of any  recurrent episodes of arrhythmia.  Apparently his blood pressure had become elevated and Bobby [name] added losartan to his medical regimen.  Of note, he also already was on lisinopril 2.5 mg, furosemide 20 mg, carvedilol 6.25 mg twice a day.  He has been only intermittently using CPAP due to complaints of the mask on his nose bridge.  As result, he was recently changed to a different mask.  A download was obtained in the office today in which he does not meet compliance.  [employer] is his DME company.  His set up date was March 17, 2018 and he has until June 17, 2018 to demonstrate compliance.    When I saw him in September 2019 his blood pressure was elevated.  At that time apparently he was on both the previous lisinopril and the recent losartan that was started by Bobby [name].  I suggested he discontinue lisinopril and further titrated losartan to 25 mg twice a day both for improved blood pressure and for his reduced LV function.  He was not compliant with reference to his CPAP therapy and I had a long discussion with him regarding the importance that he meet compliance by  October 15.  I saw him in October 2019.  At that time he was not using his CPAP therapy.  I reviewed his blood pressure recordings and they seem to be consistently elevated.  He continues to be on Eliquis and was taking Lasix 20 mg.  That evaluation I had a long discussion with him guarding his noncompliance with CPAP.  I discussed alternatives such as customized oral appliance.  We discussed with his LV dysfunction he may be a candidate for Jardiance if his renal function is stable.  Was maintaining sinus rhythm.  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  His ECG was unchanged.  Troponins were negative.  He was discharged from the ER and seen by Bobby [name], [facility] on October 01, 2018.  He had not had any further chest pain since he left the hospital and no ischemic work-up was planned.  His renal function had worsened and his furosemide was changed to every other day.  Visibly he feels well and has not had recurrent chest pain.  He uses oxygen at 2 L nasal cannula at nighttime instead of his CPAP therapy.  Laboratory 2 days prior to his office visit has shown a creatinine of 2.07 with a BUN of 37.  Potassium was 5.4.   I  saw him in February 2020.  At that time his blood pressure was stable and he was taken furosemide 20 mg every other day, carvedilol 18.75 mg twice a day, and valsartan 40 mg.  Renal function improved with discontinuance of furosemide.  I saw him in November 2020 when he came to the office with his sister. He was on supplemental oxygen at nighttime but has not been using CPAP.  Upon further questioning he had difficulty with the mask on the bridge of his nose.  Recently, his blood pressure has been elevated and he has been of seen by Bobby [name].  He apparently was now on losartan 50 mg in place of valsartan, carvedilol 18.75 mg twice a day, and he is on  Plavix in addition to apixaban 5 mg twice a day.  He continued to be on atorvastatin 80 mg daily for  hyperlipidemia.  He is diabetic on metformin.  He is on bupropion 300 mg for depression.  During that evaluation, his blood pressure was elevated at 162/90 I recommended further titration of carvedilol to 25 mg twice a day and added amlodipine 5 mg.  I again discussed the importance of using CPAP therapy.  He had stage III chronic kidney disease with creatinine improving from 2.07 to 1.77.  I saw him in May 2021 at which time any chest pain.He was treated with an infection by Bobby [name] and at times has had some recurrent congestion.  He is breathing better.  He uses supplemental oxygen but admits that at times he does not use it every night.  Unfortunately he still smoking a pack of cigarettes lasting 2 to 2-1/2 days.  Laboratory on November 05, 2019 showed a creatinine at 2.25.  He has recently been on [employer] in addition to albuterol for wheezing.  However he had was not consistently using his combination drug treatment.    I  saw him in May 02, 2020.  Over the prior months he continued to be stable from a cardiac standpoint.   He underwent a follow-up echo Doppler study on February 15, 2020 which showed an EF of 35 to 40%.  There was mild LVH of the basal septal segment.  There was grade 1 diastolic dysfunction.  He had previously noted akinesis of the apex and basal inferior wall and mid apical anteroseptal wall.  There was no evidence for residual thrombus.  Last week he under went right neck melanoma resection by Bobby [name].  He has further decreased his tobacco use.    He was evaluated by Bobby [name] in March 2022.  I last saw him on May 11, 2021 at which time he remained relatively stable but admitted to rare episodes of chest discomfort occurring approximately every 1 to 1-1/2 months.  Unfortunately he continues to smoke cigarettes.  He believes he is sleeping adequately.  His primary  physician, Bobby [name] had checked laboratory July 08, 2020.  Creatinine was increased at 1.94.  He is unaware of palpitations, presyncope or syncope.  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  He had anteroseptal Q waves consistent with his prior MI.  I recommended follow-up laboratory and a follow-up echo Doppler assessment.  Since I saw him he was evaluated by one of our pulmonary on June 15, 2019.[name]  He has COPD and dyspnea which is multifactorial as result of his combined systolic and diastolic heart failure, COPD, CAD, and cardiac deconditioning.  Presently, he denies chest pain.  Recent laboratory showing hyper kalemia with potassium at 5.5 and creatinine increased to 2.36.  He was advised to hold losartan as well as discontinue spironolactone.  He presents for follow-up evaluation.   Past Medical History:  Diagnosis Date   CAD (coronary artery disease),residual non obstructive disease    a. 08/2012 Inferior STEMI: RCA 99% (3.5x32 Promus DES), nonobs LAD/LCX dzs;  b. 01/2013 MV: EF 66%, small inferoseptal artifact and possible small inferobasilar scar;  c. 03/2014 MV: fixed inf defect->diaph attenuation, no ischemia. 12/18 PCI/DES to mLAD, staged PCI/DES Lcx/OM   DM (diabetes mellitus), type 2 new diagnosis 08/05/2012   diet control   ED (erectile dysfunction)    surgery planned   History of echocardiogram    a. 08/2012 Echo: EF 55-60%, possible mild basal inferior HK, mildly dil RA, PASP 62mHg.  Hyperlipidemia 08/05/2012   Hypertensive heart disease    MI (myocardial infarction) ([place]) 08/04/2012   acute inferior stemi secondary to RCA occlusion; PCI   Panic attack    Pulmonary HTN ([place])    echo 08/05/12, EF 55-60%, PA pressure 30m   S/P coronary artery stent placement, to RCA Promus DES 08/05/2012   Stroke ([facility]    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   Tobacco abuse     Past Surgical History:   Procedure Laterality Date   CARDIAC CATHETERIZATION  08/04/12   PCI to RCA with DES   CORONARY STENT INTERVENTION N/A 08/12/2017   Procedure: CORONARY STENT INTERVENTION;  Surgeon: JMartinique Peter M, MD;  [facility];  Service: Cardiovascular;  Laterality: N/A;   CORONARY/GRAFT ACUTE MI REVASCULARIZATION N/A 08/09/2017   Procedure: Coronary/Graft Acute MI Revascularization;  Surgeon: SBelva Crome MD;  [facility];  Service: Cardiovascular;  Laterality: N/A;   LEFT HEART CATH AND CORONARY ANGIOGRAPHY N/A 08/09/2017   Procedure: LEFT HEART CATH AND CORONARY ANGIOGRAPHY;  Surgeon: SBelva Crome MD;  [facility];  Service: Cardiovascular;  Laterality: N/A;   LEFT HEART CATHETERIZATION WITH CORONARY ANGIOGRAM N/A 08/04/2012   Procedure: LEFT HEART CATHETERIZATION WITH CORONARY ANGIOGRAM;  Surgeon: TTroy Sine MD;  [facility];  Service: Cardiovascular;  Laterality: N/A;   LEG SURGERY Left    rod placed for fracture repair   PENILE PROSTHESIS IMPLANT N/A 06/03/2014   Procedure: IMPLANT PENILE PROTHESIS INFLATABLE;  Surgeon: SAilene Rud MD;  Location: [facility];  Service: Urology;  Laterality: N/A;  with penile block--0.5% marcaine plain   PERCUTANEOUS CORONARY STENT INTERVENTION (PCI-S) Right 08/04/2012   Procedure: PERCUTANEOUS CORONARY STENT INTERVENTION (PCI-S);  Surgeon: TTroy Sine MD;  [facility];  Service: Cardiovascular;  Laterality: Right;   THUMB ARTHROSCOPY Left    thumb joint replaced    Allergies  Allergen Reactions   Hydrocodone Itching    Current Outpatient Medications  Medication Sig Dispense Refill   albuterol (PROVENTIL HFA;VENTOLIN HFA) 108 (90 Base) MCG/ACT inhaler Inhale 2 puffs into the lungs every 6 (six) hours as needed for wheezing or shortness of breath. 1 Inhaler 2   albuterol (PROVENTIL) (2.5 MG/3ML) 0.083% nebulizer solution Take 5 mg by nebulization every 4 (four) hours as  needed. For wheezing or SOB     amLODipine (NORVASC) 5 MG tablet TAKE ONE TABLET BY MOUTH DAILY 180 tablet 3   apixaban (ELIQUIS) 5 MG TABS tablet Take 1 tablet (5 mg total) by mouth 2 (two) times daily. 28 tablet 1   aspirin EC 81 MG tablet Take 1 tablet (81 mg total) by mouth daily. Swallow whole. 90 tablet 3   atorvastatin (LIPITOR) 80 MG tablet Take 1 tablet (80 mg total) by mouth daily at 6 PM. 90 tablet 3   benzonatate (TESSALON) 100 MG capsule Take 100-200 mg by mouth 3 (three) times daily as needed.     BREZTRI AEROSPHERE 160-9-4.8 MCG/ACT AERO Inhale 2 puffs into the lungs 2 (two) times daily.     buPROPion (WELLBUTRIN XL) 300 MG 24 hr tablet Take 300 mg by mouth daily.     carvedilol (COREG) 25 MG tablet TAKE ONE TABLET BY MOUTH TWICE DAILY 180 tablet 3   clonazePAM (KLONOPIN) 0.5 MG tablet Take 1 mg by mouth 2 (two) times daily.   0   Coenzyme Q10 (COQ-10) 100 MG CAPS Take 1 capsule by mouth daily.  COLCRYS 0.6 MG tablet Take 0.6 mg by mouth daily.  3   doxycycline (VIBRAMYCIN) 100 MG capsule Take 1 capsule (100 mg total) by mouth 2 (two) times daily. 14 capsule 0   metFORMIN (GLUCOPHAGE-XR) 500 MG 24 hr tablet Take 500 mg by mouth daily.  3   nitroGLYCERIN (NITROSTAT) 0.4 MG SL tablet DISSOLVE 1 TABLET UNDER THE TONGUE EVERY 5 MINUTES AS NEEDED FOR CHEST PAIN. DO NOT EXCEED A TOTAL OF 3 DOSES IN 15 MINUTES. 90 tablet 3   Omega-3 Fatty Acids (FISH OIL PO) Take 1 tablet by mouth daily.     oxyCODONE-acetaminophen (PERCOCET) 10-325 MG per tablet Take 1 tablet by mouth 3 (three) times daily.     predniSONE (DELTASONE) 10 MG tablet Take 10 mg by mouth daily.     sertraline (ZOLOFT) 100 MG tablet Take 100 mg by mouth daily.     No current facility-administered medications for this visit.    Socially,  he is divorced. He doesn't walk but not routinely exercise.  He continues to smoke one pack of cigarettes per day.  ROS General: Negative; No fevers, chills, or night sweats;   HEENT: Negative; No changes in vision or hearing, sinus congestion, difficulty swallowing Pulmonary:  Positive for shortness of breath; COPD Cardiovascular: See history of present illness GI: Negative; No nausea, vomiting, diarrhea, or abdominal pain GU: Negative; No dysuria, hematuria, or difficulty voiding Musculoskeletal: Negative; no myalgias, joint pain, or weakness Hematologic/Oncology: Negative; no easy bruising, bleeding Endocrine: Positive for diabetes no heat/cold intolerance;  Neuro: Negative; no changes in balance, headaches Skin: Negative; No rashes or skin lesions Psychiatric: Negative; No behavioral problems, depression Sleep: obstructive sleep apnea, CPAP initiated March 17, 2018; currently not on therapy. no bruxism, restless legs, hypnogognic hallucinations, no cataplexy Other comprehensive 14 point system review is negative.   PE BP (!) 145/73    Pulse 84    Ht $R'5\' 9"'Zb$  (1.753 m)    Wt 258 lb 9.6 oz (117.3 kg)    SpO2 93%    BMI 38.19 kg/m    Repeat blood pressure by me was 114/70  Wt Readings from Last 3 Encounters:  09/05/21 258 lb 9.6 oz (117.3 kg)  09/03/21 257 lb (116.6 kg)  07/17/21 252 lb 9.6 oz (114.6 kg)     Physical Exam BP (!) 145/73    Pulse 84    Ht $R'5\' 9"'fC$  (1.753 m)    Wt 258 lb 9.6 oz (117.3 kg)    SpO2 93%    BMI 38.19 kg/m  General: Alert, oriented, no distress.  Skin: normal turgor, no rashes, warm and dry HEENT: Normocephalic, atraumatic. Pupils equal round and reactive to light; sclera anicteric; extraocular muscles intact; Fundi ** Nose without nasal septal hypertrophy Mouth/Parynx benign; Mallinpatti scale Neck: No JVD, no carotid bruits; normal carotid upstroke Lungs: clear to ausculatation and percussion; no wheezing or rales Chest wall: without tenderness to palpitation Heart: PMI not displaced, RRR, s1 s2 normal, 1/6 systolic murmur, no diastolic murmur, no rubs, gallops, thrills, or heaves Abdomen: soft, nontender; no  hepatosplenomehaly, BS+; abdominal aorta nontender and not dilated by palpation. Back: no CVA tenderness Pulses 2+ Musculoskeletal: full range of motion, normal strength, no joint deformities Extremities: no clubbing cyanosis or edema, Homan's sign negative  Neurologic: grossly nonfocal; Cranial nerves grossly wnl Psychologic: Normal mood and affect     Physical Exam BP (!) 145/73    Pulse 84    Ht $R'5\' 9"'gV$  (1.753 m)    Wt  258 lb 9.6 oz (117.3 kg)    SpO2 93%    BMI 38.19 kg/m  General: Alert, oriented, no distress.  Skin: normal turgor, no rashes, warm and dry HEENT: Normocephalic, atraumatic. Pupils equal round and reactive to light; sclera anicteric; extraocular muscles intact;  Nose without nasal septal hypertrophy Mouth/Parynx benign; Mallinpatti scale 3 Neck: No JVD, no carotid bruits; normal carotid upstroke Lungs: clear to ausculatation and percussion; no wheezing or rales Chest wall: without tenderness to palpitation Heart: PMI not displaced, RRR, s1 s2 normal, 1/6 systolic murmur, no diastolic murmur, no rubs, gallops, thrills, or heaves Abdomen: soft, nontender; no hepatosplenomehaly, BS+; abdominal aorta nontender and not dilated by palpation. Back: no CVA tenderness Pulses 2+ Musculoskeletal: full range of motion, normal strength, no joint deformities Extremities: no clubbing cyanosis or edema, Homan's sign negative  Neurologic: grossly nonfocal; Cranial nerves grossly wnl Psychologic: Normal mood and affect  ECG (independently read by me):  NSR at 84, RBBB, 1st degree AV block;     July 15, 2021 ECG (independently read by me):  NSR at 62, LAD, RBBB, old inferior and anterior infarct  May 11, 2021 ECG (independently read by me): Normal sinus rhythm at 76 bpm, right bundle branch block, left anterior hemiblock.  No ectopy.  Anteroseptal Q waves consistent with prior anterior MI  May 02, 2020 ECG (independently read by me): Normal sinus rhythm at 69 bpm,  bifascicular block with right bundle branch block and left anterior hemiblock.  On anteroseptal Q waves and inferior Q waves consistent with prior MIs.  Mild T wave abnormality laterally.  QTc interval 432 ms.     May 21, 2021ECG (independently read by me): Sinus bradycarrdia at 57; RBBB, Q V1-3 ands avF  July 24, 2019 ECG (independently read by me): Normal sinus rhythm at 80 bpm.  Right bundle branch block; previously noted Q waves V1 through V4 and 2 3 and F consistent with prior infarct  October 15, 2018 ECG (independently read by me): Normal sinus rhythm at 93 bpm with baseline artifact.  Right bundle branch block.  Anterior Q waves consistent with prior infarct.  Inferior Q waves.  ECG (independently read by me): Sinus rhythm at 65 bpm.  PAC.  Right bundle branch block, inferior Q waves in 3 and aVF, old anterior myocardial infarction.  Lateral T wave abnormality.  Normal intervals.  June 16, 2018 ECG (independently read by me): Normal sinus rhythm at 84 bpm.  Right bundle branch block with repolarization changes.  Inferior Q waves consistent with prior infarct.  Anteroseptal Q waves consistent with prior anterior MI.  May 28, 2018 ECG (independently read by me): Normal sinus rhythm with sinus arrhythmia.  Right bundle branch block with repolarization changes.  Q waves consistent with anteroseptal MI and possible inferior MI no indication to adjust the pain upon his needs  July 17,2019 ECG (independently read by me): Normal sinus rhythm 89 bpm.  Right bundle branch block with repolarization changes.  Inferior Q waves and anterior  Q waves consistent with anterolateral infarct with possible inferior infarct  February 21, 2018 ECG (independently read by me): Normal sinus rhythm at 95 bpm, right bundle branch block with repolarization changes.  Anterolateral MI.  QTc interval 477 ms  Jan 24, 2018 ECG (independently read by me): Sinus rhythm at 97 bpm.  Right bundle branch block with  repolarization changes.  QTc interval 472 ms.  Anterolateral Q waves  Jan 07, 2018 ECG (independently read by me): Normal sinus  rhythm at 88 bpm.  Right bundle branch block with repolarization changes.  Inferior Q waves.  T wave anteroseptally, possible anteroseptal MI undetermined.  November 2017 ECG (independently read by me): Normal sinus rhythm at 64 bpm.  Branch block with repolarization changes.  QTc interval 460 ms.  June 2016 ECG (independently read by me): Sinus bradycardia at 57 bpm, right bundle branch block with repositioning changes.  QTc interval 459 ms.  September 2015 ECG (independently read by me and (: Normal sinus rhythm.  Right bundle branch block with repolarization changes.  Ventricular rate 81.  02/25/2014 ECG Normal sinus rhythm.  Right bundle branch block with repolarization changes.  PR interval 148 ms; QTc interval 463 ms  Prior ECG: sinus rhythm at 76 beats per minute; right bundle-branch block with repolarization changes. PR interval 142 ms, QTC 481 ms.   LABS:  BMP Latest Ref Rng & Units 09/04/2021 09/03/2021 07/20/2021  Glucose 70 - 99 mg/dL 137(H) 110(H) 98  BUN 8 - 23 mg/dL 28(H) 28(H) 45(H)  Creatinine 0.61 - 1.24 mg/dL 1.87(H) 1.64(H) 2.11(H)  BUN/Creat Ratio 10 - 24 - - 21  Sodium 135 - 145 mmol/L 139 138 136  Potassium 3.5 - 5.1 mmol/L 5.0 3.8 5.2  Chloride 98 - 111 mmol/L 109 107 105  CO2 22 - 32 mmol/L 23 22 16(L)  Calcium 8.9 - 10.3 mg/dL 9.5 9.7 9.9   Hepatic Function Latest Ref Rng & Units 09/04/2021 05/30/2021 11/05/2019  Total Protein 6.5 - 8.1 g/dL 6.4(L) 6.5 6.5  Albumin 3.5 - 5.0 g/dL 3.7 4.3 3.9  AST 15 - 41 U/L _0 ALT 0 - 44 U/L _1 Alk Phosphatase 38 - 126 U/L 99 109 94  Total Bilirubin 0.3 - 1.2 mg/dL 0.9 0.3 0.7   CBC Latest Ref Rng & Units 09/04/2021 09/03/2021 05/30/2021  WBC 4.0 - 10.5 K/uL 9.1 9.6 8.4  Hemoglobin 13.0 - 17.0 g/dL 13.0 13.3 13.8  Hematocrit 39.0 - 52.0 % 42.3 40.8 42.3  Platelets 150 - 400 K/uL 183 172 179    Lab Results  Component Value Date   MCV 94.2 09/04/2021   MCV 89.7 09/03/2021   MCV 90 05/30/2021   Lab Results  Component Value Date   TSH 0.878 05/30/2021   Lipid Panel     Component Value Date/Time   CHOL 141 05/30/2021 1050   TRIG 133 05/30/2021 1050   HDL 40 05/30/2021 1050   CHOLHDL 3.5 05/30/2021 1050   CHOLHDL 4.8 08/09/2017 1117   VLDL 52 (H) 08/09/2017 1117   LDLCALC 77 05/30/2021 1050   INR result since initiation of Coumadin: 1:> 7.6;> >10;> 8.6 (Vit K administered) >4.3; no longer on Coumadin, now on Eliquis  RADIOLOGY: No results found.  IMPRESSION: No diagnosis found.  ASSESSMENT AND PLAN:  Mr. Beegle is a 76 year old Caucasian male who suffered an acute coronary syndrome on 08/04/2012 when he presented with subtotal occlusion of his RCA. He had diffuse disease beyond the subtotal occlusion and ultimately had successful insertion of a 3.25x32 mm Promus DES stent. He had concomitant CAD with 40-50% proximal LAD narrowing, 30% circumflex marginal stenoses. NMR lipoprofile after initiation of atorvastatin in January 2014 showed marked improvement in total cholesterol of 119 LDL 48 LDL particle #992. HDL was very low at 30 with an increased triglyceride at 203 and significantly reduced HDL particle #24.2. His insulin resistance score was elevated at 65.  He suffered an anterior STEMI in December 2018  and underwent successful stenting and several days later he required staged intervention to circumflex and marginal vessel. 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  Warfarin was initially prescribed but due to significant issues with compliance and overmedication the decision was made that he would be safer using Eliquis even though he is on this for apical thrombus rather than atrial fibrillation.   He was subsequently found to have significant obstructive sleep apnea but stopped therapy and now usesnocturnal oxygen.  His echo Doppler study  in June 2021 continue to show an EF at 35 to 40% with moderate dilation of LV internal dimensions.  There was mild LVH and grade 1 diastolic dysfunction.  There was evidence for moderate aortic sclerosis with mild mitral annular calcification.  There was no evidence for his previous apical thrombus.  I reviewed his most recent echo Doppler study from June 05, 2021 which showed an EF at 30 to 35% with previously noted septal and apical akinesis and inferior wall hypokinesis.  There was mild mitral regurgitation and mild to moderate aortic valve sclerosis without stenosis.  He has been on anticoagulation therapy with Eliquis.  Recent laboratory had shown increasing creatinine and elevated potassium.  He was advised to discontinue spironolactone and hold his losartan with plans for follow-up lab work in several days following this office visit.  If potassium had normalized and renal function is better perhaps low-dose losartan could be resumed.  At his last office visit we had discussed potential transition to [facility].  Unfortunately he continues to smoke and has been smoking for 65 years and started at age 71.  He continues to be on atorvastatin 80 mg for hyperlipidemia.  LDL cholesterol was 77 in September 2022.  I will contact him regarding his follow-up laboratory and further recommendations will be made at that time.  If renal function is worse, nephrology evaluation will most likely be recommended.   Bobby Sine, MD, [facility]  09/05/2021 12:22 PM

## 2021-09-05 NOTE — Progress Notes (Addendum)
I patient ID: Bobby [name], male   DOB: 1945-12-09, 76 y.o.   MRN: [id number]    HPI: Bobby [name] is a 76 y.o. male presents to the office today for a 2 month followup evaluation.   Mr. Bobby [name]  has a long-standing tobacco history having started smoking at age 20, a history of hypertension, remote small TIA/CVA who presented to the hospital on 08/04/2012 in the setting of inferior ST segment elevation myocardial infarction. Catheterization by me revealed a 99% stenosis of the RCA and concomitant CAD involving his LAD and circumflex vessels. He underwent acute percutaneous coronary intervention with an excellent door to balloon time of only 20 minutes and ultimately insertion of a 3.25x32 mm DES stent post dilated 3.3. An echo done in the hospital showed an EF of 50-60% with mild basal inferior probable scar and moderate pulmonary hypertension with PA pressure of 45 mm.  When I saw Mr. Bobby [name] in May 2014, he did note shortness of breath with activity. He denied definitive chest pain. At that time, he realized that he had inadvertentlystopped taking the atorvastatin 40 mg dose and this was resumed. In addition, his Toprol dose was increased from 50 to 75 mg daily. A nuclear study showed an ejection fraction of 66%. Perfusion was essentially normal with exception of a small region of fixed inferoseptal bowel artifact and possible small area of inferobasilar scar.   Mr. Bobby [name] unfortunately continues to smoke at least a pack of cigarettes per day.   He does experience shortness of breath with activity.  He denies recent chest pain.  He has chronic right bundle branch block with repolarization changes.  He is diabetic and has pulmonary hypertension.  A follow-up nuclear perfusion study on 03/26/2014 remianed low risk and showed normal LV function and normal wall motion.  There was a fixed inferior defect that was felt most consistent with diaphragmatic attenuation.  There was no evidence for ischemia.  He  underwent prostate surgery by Dr. Gaynelle [name] without cardiovascular compromise.    A screening abdominal aortic ultrasound revealed a normal abdominal aorta.  Since I saw him in November 2017, he had an extensive history and has had recurrent hospitalizations he was hospitalized in October 2018 for left arm pain.  Troponin was negative.  In December 2018 he suffered an anterior ST segment elevation MI due to acute occlusion of his mid LAD and underwent successful PCI of his LAD with insertion of a 2.5 x 22 mm Resolute Onyx DES stent.  He was also found to have 99% dominant mid left circumflex lesion with 80% stenosis in the OM1 vessel and underwent staged intervention to the mid circumflex and marginal vessels.  Troponin was greater than 65.  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  There was mention of sludge is a sign of pre-thrombotic state but no definite thrombus was seen at the apex.  He returned to the hospital August 20, 2017 with malaise and dyspnea.  He was bradycardic.  He was diuresed.  He has renal insufficiency with creatinines increasing up to 1.9.  He has been followed by Bobby [name] on numerous occasions in the office.  Unfortunately continues to smoke cigarettes.  He admits to 1/2 pack/day.  He does note occasional dizziness as well as some shortness of breath.  He denies recurrent anginal type symptoms.   Suggestive of low flow state.  When I saw him on  Jan 07, 2018  I recommended that he undergo a follow-up echo Doppler study to reassess systolic and diastolic function as well as potential for apical thrombus formation.  He had continued to be on aspirin and Brilinta following his STEMI.  He was on atorvastatin 80 mg for hyperlipidemia with target less than 70.  I had a long discussion regarding smoking cessation.  He underwent a follow-up echo Doppler study on Jan 20, 2018.  This showed reduced LV function with an EF  of 35 to 40% with akinesis of the mid apical anterolateral and septal as well as apical walls.  There was grade 1 diastolic dysfunction.  There was now a possible small layered thrombus at the apex noted using Definity and also evidence of swirling at the LV apex suggestive of low flow state.   As result of the echo findings, I saw him in follow-up on Jan 24, 2018 at which time I had a very long discussion with him guarding potential thromboembolic stroke risk.  He was started on warfarin anticoagulation.  Apparently, the patient's medications have been in a  pack and had been followed by his girlfriend.  He was taking his medications correctly resulting in significant over anticoagulation with INR reached greater than 10.  He has been followed very closely by our pharmacy department and he also was given vitamin K.  Due to his poor medication compliance after much discussion ultimately the thought is for him to transition to Eliquis once his INR gets below 2.  He also has been taking his blood pressure medicines incorrectly.  Patient has been sleeping most of the day.  In the past I had recommended he undergo a sleep evaluation for sleep apnea which he never followed up with it was a no-show in the sleep lab.  He was worked into my schedule on February 21, 2018 and was seen as an add-on due to his medication issues.  He was here now with his sister who in the past had done an excellent job in caring for his medications and she is committed now to resume doing this for him.  During his evaluation, he was exceptionally somnolent, and a significant time was spent with medication adjustment.  Due to his inability to take effect of warfarin the decision was made to use Eliquis for anticoagulation with more consistent dosing.  His recent laboratory had shown stage IV chronic kidney disease and it was recommended that he hold his lisinopril for several days and then reduce the dose to 5 mg as well as holding his  furosemide with subsequent reduction of dose with reinstitution at 20 mg.  I scheduled him for an expeditious sleep study due to concerns for significant sleep apnea and he was seen5 days later by Racquel with medication adjustment and anticoagulation issues.  He underwent a sleep study on February 25, 2018 and he was found to have moderate overall sleep apnea with an AHI of 22.2 and RDI of 25.4.  He could not achieve any rem sleep and the overall severity may very well be underestimated.  He was titrated up to 10 cm water pressure.  An initial trial of CPAP auto with an EPR range of 8-15 was recommended.  When I last saw him on March 19, 2018 he had he had just initiated CPAP the evening before noted significant improvement in his sleep. His renal function had significantly improved from a creatinine of 2.7 down to 1.362 weeks ago.  He is unaware of  any recurrent episodes of arrhythmia.  Apparently his blood pressure had become elevated and Dr. Darron Doom added losartan to his medical regimen.  Of note, he also already was on lisinopril 2.5 mg, furosemide 20 mg, carvedilol 6.25 mg twice a day.  He has been only intermittently using CPAP due to complaints of the mask on his nose bridge.  As result, he was recently changed to a different mask.  A download was obtained in the office today in which he does not meet compliance.  [employer] is his DME company.  His set up date was March 17, 2018 and he has until June 17, 2018 to demonstrate compliance.    When I saw him in September 2019 his blood pressure was elevated.  At that time apparently he was on both the previous lisinopril and the recent losartan that was started by Dr. Darron Doom.  I suggested he discontinue lisinopril and further titrated losartan to 25 mg twice a day both for improved blood pressure and for his reduced LV function.  He was not compliant with reference to his CPAP therapy and I had a long discussion with him regarding the importance that he meet  compliance by October 15.  I saw him in October 2019.  At that time he was not using his CPAP therapy.  I reviewed his blood pressure recordings and they seem to be consistently elevated.  He continues to be on Eliquis and was taking Lasix 20 mg.  That evaluation I had a long discussion with him guarding his noncompliance with CPAP.  I discussed alternatives such as customized oral appliance.  We discussed with his LV dysfunction he may be a candidate for Jardiance if his renal function is stable.  Was maintaining sinus rhythm.  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  His ECG was unchanged.  Troponins were negative.  He was discharged from the ER and seen by [name], [facility] on October 01, 2018.  He had not had any further chest pain since he left the hospital and no ischemic work-up was planned.  His renal function had worsened and his furosemide was changed to every other day.  Visibly he feels well and has not had recurrent chest pain.  He uses oxygen at 2 L nasal cannula at nighttime instead of his CPAP therapy.  Laboratory 2 days prior to his office visit has shown a creatinine of 2.07 with a BUN of 37.  Potassium was 5.4.   I saw him in February 2020.  At that time his blood pressure was stable and he was taken furosemide 20 mg every other day, carvedilol 18.75 mg twice a day, and valsartan 40 mg.  Renal function improved with discontinuance of furosemide.  I saw him in November 2020 when he came to the office with his sister. He was on supplemental oxygen at nighttime but has not been using CPAP.  Upon further questioning he had difficulty with the mask on the bridge of his nose.  Recently, his blood pressure has been elevated and he has been of seen by Dr. Darron Doom.  He apparently was now on losartan 50 mg in place of valsartan, carvedilol 18.75 mg twice a day,  and he is on Plavix in addition to apixaban 5 mg twice a day.  He continued to be on atorvastatin 80 mg daily for  hyperlipidemia.  He is diabetic on metformin.  He is on bupropion 300 mg for depression.  During that evaluation, his blood pressure was elevated at 162/90 I recommended further titration of carvedilol to 25 mg twice a day and added amlodipine 5 mg.  I again discussed the importance of using CPAP therapy.  He had stage III chronic kidney disease with creatinine improving from 2.07 to 1.77.  I saw him in May 2021 at which time any chest pain.He was treated with an infection by Dr. Darron Doom and at times has had some recurrent congestion.  He is breathing better.  He uses supplemental oxygen but admits that at times he does not use it every night.  Unfortunately he still smoking a pack of cigarettes lasting 2 to 2-1/2 days.  Laboratory on November 05, 2019 showed a creatinine at 2.25.  He has recently been on [employer] in addition to albuterol for wheezing.  However he had was not consistently using his combination drug treatment.    I  saw him in May 02, 2020.  Over the prior months he continued to be stable from a cardiac standpoint.   He underwent a follow-up echo Doppler study on February 15, 2020 which showed an EF of 35 to 40%.  There was mild LVH of the basal septal segment.  There was grade 1 diastolic dysfunction.  He had previously noted akinesis of the apex and basal inferior wall and mid apical anteroseptal wall.  There was no evidence for residual thrombus.  Last week he under went right neck melanoma resection by Dr. Martin Majestic.  He has further decreased his tobacco use.    He was evaluated by Kerin Ransom in March 2022.  I last saw him on May 11, 2021 at which time he remained relatively stable but admitted to rare episodes of chest discomfort occurring approximately every 1 to 1-1/2 months.  Unfortunately he continues to smoke cigarettes.  He believes he is sleeping adequately.  His  primary physician, Dr. Darron Doom had checked laboratory July 08, 2020.  Creatinine was increased at 1.94.  He is unaware of palpitations, presyncope or syncope.  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  He had anteroseptal Q waves consistent with his prior MI.  I recommended follow-up laboratory and a follow-up echo Doppler assessment.  He was evaluated by pulmonary on June 15, 2019.[name]  He has COPD and dyspnea which is multifactorial as result of his combined systolic and diastolic heart failure, COPD, CAD, and cardiac deconditioning.  I last saw him on July 17, 2021.  His echo Doppler from June 05, 2021 showed an EF of 30 to 35% with previously noted septal apical akinesis and inferior wall hypokinesis, mild mitral regurgitation, and mild to moderate aortic valve sclerosis without stenosis.  He has continued to be on Eliquis for anticoagulation.  Laboratory had shown elevation of potassium  potassium at 5.5 and creatinine increased to 2.36.  He was advised to hold losartan as well as discontinue spironolactone.    Presently, occasional shortness of breath.  He was evaluated by Gerald Leitz, NP pulmonary on August 22, 2021 as part of the lung cancer screening.  Chest CT on December 21 showed tiny pulmonary nodules felt to be long RADS 2 benign appearance.  He also was noted to have coronary atherosclerosis.  He was recently evaluated at [facility] ER on January 1, diagnosed with pneumonia and discharged with doxycycline.  He denies any chest tightness or pressure.  He has not been using CPAP therapy.  He presents for evaluation.   Past Medical History:  Diagnosis Date   CAD (coronary artery disease),residual non obstructive disease    a. 08/2012 Inferior STEMI: RCA 99% (3.5x32 Promus DES), nonobs LAD/LCX dzs;  b. 01/2013 MV: EF 66%, small inferoseptal artifact and possible small inferobasilar scar;  c.  03/2014 MV: fixed inf defect->diaph attenuation, no ischemia. 12/18 PCI/DES to mLAD, staged PCI/DES Lcx/OM   DM (diabetes mellitus), type 2 new diagnosis 08/05/2012   diet control   ED (erectile dysfunction)    surgery planned   History of echocardiogram    a. 08/2012 Echo: EF 55-60%, possible mild basal inferior HK, mildly dil RA, PASP 59mHg.   Hyperlipidemia 08/05/2012   Hypertensive heart disease    [place]   acute inferior stemi secondary to RCA occlusion; PCI   Panic attack    [place]    echo 08/05/12, EF 55-60%, PA pressure 48m  S/P coronary artery stent placement, to RCA Promus DES 08/05/2012   [facility]   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   Tobacco abuse     Past Surgical History:  Procedure Laterality Date   CARDIAC CATHETERIZATION  08/04/12   PCI to RCA with DES   CORONARY STENT INTERVENTION N/A 08/12/2017   Procedure: CORONARY STENT INTERVENTION;  Surgeon: JoMartiniquePeter M, MD;  [facility];  Service: Cardiovascular;  Laterality: N/A;   CORONARY/GRAFT ACUTE MI REVASCULARIZATION N/A 08/09/2017   Procedure: Coronary/Graft Acute MI Revascularization;  Surgeon: SmBelva CromeMD;  [facility];  Service: Cardiovascular;  Laterality: N/A;   LEFT HEART CATH AND CORONARY ANGIOGRAPHY N/A 08/09/2017   Procedure: LEFT HEART CATH AND CORONARY ANGIOGRAPHY;  Surgeon: SmBelva CromeMD;  [facility];  Service: Cardiovascular;  Laterality: N/A;   LEFT HEART CATHETERIZATION WITH CORONARY ANGIOGRAM N/A 08/04/2012   Procedure: LEFT HEART CATHETERIZATION WITH CORONARY ANGIOGRAM;  Surgeon: ThTroy SineMD;  [facility];  Service: Cardiovascular;  Laterality: N/A;   LEG SURGERY Left    rod placed for fracture repair   PENILE PROSTHESIS IMPLANT N/A 06/03/2014   Procedure: IMPLANT PENILE PROTHESIS INFLATABLE;  Surgeon: SiAilene RudMD;  Location: [facility];  Service: Urology;   Laterality: N/A;  with penile block--0.5% marcaine plain   PERCUTANEOUS CORONARY STENT INTERVENTION (PCI-S) Right 08/04/2012   Procedure: PERCUTANEOUS CORONARY STENT INTERVENTION (PCI-S);  Surgeon: ThTroy SineMD;  [facility];  Service: Cardiovascular;  Laterality: Right;   THUMB ARTHROSCOPY Left    thumb joint replaced    Allergies  Allergen Reactions   Hydrocodone Itching    Current Outpatient Medications  Medication Sig Dispense Refill   albuterol (PROVENTIL HFA;VENTOLIN HFA) 108 (90 Base) MCG/ACT inhaler Inhale 2 puffs into the lungs every 6 (six) hours as needed for wheezing or shortness of breath. 1 Inhaler 2   albuterol (PROVENTIL) (2.5 MG/3ML) 0.083% nebulizer solution Take 5 mg by nebulization every 4 (four) hours as needed. For wheezing or SOB     amLODipine (NORVASC) 5 MG tablet TAKE ONE TABLET BY MOUTH DAILY (Patient taking differently: Take 5 mg by mouth daily.) 180 tablet 3   apixaban (ELIQUIS) 5 MG TABS tablet Take 1 tablet (5 mg total) by mouth 2 (two) times daily. 28 tablet 1   aspirin EC 81 MG tablet Take 1 tablet (81 mg  total) by mouth daily. Swallow whole. (Patient taking differently: Take 81 mg by mouth at bedtime. Swallow whole.) 90 tablet 3   atorvastatin (LIPITOR) 80 MG tablet Take 1 tablet (80 mg total) by mouth daily at 6 PM. 90 tablet 3   benzonatate (TESSALON) 100 MG capsule Take 100-200 mg by mouth 3 (three) times daily as needed for cough.     BREZTRI AEROSPHERE 160-9-4.8 MCG/ACT AERO Inhale 2 puffs into the lungs 2 (two) times daily.     buPROPion (WELLBUTRIN XL) 300 MG 24 hr tablet Take 300 mg by mouth daily.     carvedilol (COREG) 25 MG tablet TAKE ONE TABLET BY MOUTH TWICE DAILY (Patient taking differently: Take 25 mg by mouth 2 (two) times daily with a meal.) 180 tablet 3   Coenzyme Q10 (COQ-10) 100 MG CAPS Take 100 mg by mouth daily.     COLCRYS 0.6 MG tablet Take 0.6 mg by mouth daily.  3   hydrALAZINE (APRESOLINE) 10 MG tablet Take 1  tablet (10 mg total) by mouth 2 (two) times daily. 60 tablet 6   nitroGLYCERIN (NITROSTAT) 0.4 MG SL tablet DISSOLVE 1 TABLET UNDER THE TONGUE EVERY 5 MINUTES AS NEEDED FOR CHEST PAIN. DO NOT EXCEED A TOTAL OF 3 DOSES IN 15 MINUTES. (Patient taking differently: Place 0.4 mg under the tongue every 5 (five) minutes as needed.) 90 tablet 3   Omega-3 Fatty Acids (FISH OIL PO) Take 1 tablet by mouth daily.     oxyCODONE-acetaminophen (PERCOCET) 10-325 MG per tablet Take 1 tablet by mouth every 6 (six) hours as needed for pain.     predniSONE (DELTASONE) 10 MG tablet Take 10 mg by mouth daily.     sertraline (ZOLOFT) 100 MG tablet Take 100 mg by mouth daily.     cefdinir (OMNICEF) 300 MG capsule Take 1 capsule (300 mg total) by mouth every 12 (twelve) hours for 4 days. 8 capsule 0   Fexofenadine HCl (MUCINEX ALLERGY PO) Take 1 tablet by mouth every 6 (six) hours as needed (cough).     guaiFENesin (MUCINEX) 600 MG 12 hr tablet Take 1 tablet (600 mg total) by mouth 2 (two) times daily for 14 days. 28 tablet 0   tamsulosin (FLOMAX) 0.4 MG CAPS capsule Take 1 capsule (0.4 mg total) by mouth daily for 7 days. 7 capsule 0   No current facility-administered medications for this visit.    Socially,  he is divorced. He doesn't walk but not routinely exercise.  He continues to smoke one pack of cigarettes per day.  ROS General: Negative; No fevers, chills, or night sweats;  HEENT: Negative; No changes in vision or hearing, sinus congestion, difficulty swallowing Pulmonary:  Positive for shortness of breath; COPD Cardiovascular: See history of present illness GI: Negative; No nausea, vomiting, diarrhea, or abdominal pain GU: Negative; No dysuria, hematuria, or difficulty voiding Musculoskeletal: Negative; no myalgias, joint pain, or weakness Hematologic/Oncology: Negative; no easy bruising, bleeding Endocrine: Positive for diabetes no heat/cold intolerance;  Neuro: Negative; no changes in balance,  headaches Skin: Negative; No rashes or skin lesions Psychiatric: Negative; No behavioral problems, depression Sleep: obstructive sleep apnea, CPAP initiated March 17, 2018; currently not on therapy. no bruxism, restless legs, hypnogognic hallucinations, no cataplexy Other comprehensive 14 point system review is negative.   PE BP (!) 145/73    Pulse 84    Ht 5' 9"  (1.753 m)    Wt 258 lb 9.6 oz (117.3 kg)    SpO2 93%  BMI 38.19 kg/m    Repeat blood pressure by me was 126/70  Wt Readings from Last 3 Encounters:  09/11/21 249 lb 1.9 oz (113 kg)  09/05/21 258 lb 9.6 oz (117.3 kg)  09/03/21 257 lb (116.6 kg)    General: Alert, oriented, no distress.  Skin: normal turgor, no rashes, warm and dry HEENT: Normocephalic, atraumatic. Pupils equal round and reactive to light; sclera anicteric; extraocular muscles intact; Nose without nasal septal hypertrophy Mouth/Parynx benign; Mallinpatti scale Neck: No JVD, no carotid bruits; normal carotid upstroke Lungs: Decreased breath sounds without wheezing Chest wall: without tenderness to palpitation Heart: PMI not displaced, RRR, s1 s2 normal, 1/6 systolic murmur, no diastolic murmur, no rubs, gallops, thrills, or heaves Abdomen: soft, nontender; no hepatosplenomehaly, BS+; abdominal aorta nontender and not dilated by palpation. Back: no CVA tenderness Pulses 2+ Musculoskeletal: full range of motion, normal strength, no joint deformities Extremities: Trace edema at ankles, no clubbing cyanosis, Homan's sign negative  Neurologic: grossly nonfocal; Cranial nerves grossly wnl Psychologic: Normal mood and affect   September 05, 2021 ECG (independently read by me):  NSR at 84, LAD, RBBB, LVH Q waves V3-6, II, aVF  July 17, 2021 ECG (independently read by me):  NSR at 62, LAD, RBBB, old inferior and anterior infarct  May 11, 2021 ECG (independently read by me): Normal sinus rhythm at 76 bpm, right bundle branch block, left anterior hemiblock.   No ectopy.  Anteroseptal Q waves consistent with prior anterior MI  May 02, 2020 ECG (independently read by me): Normal sinus rhythm at 69 bpm, bifascicular block with right bundle branch block and left anterior hemiblock.  On anteroseptal Q waves and inferior Q waves consistent with prior MIs.  Mild T wave abnormality laterally.  QTc interval 432 ms.     May 21, 2021ECG (independently read by me): Sinus bradycarrdia at 57; RBBB, Q V1-3 ands avF  July 24, 2019 ECG (independently read by me): Normal sinus rhythm at 80 bpm.  Right bundle branch block; previously noted Q waves V1 through V4 and 2 3 and F consistent with prior infarct  October 15, 2018 ECG (independently read by me): Normal sinus rhythm at 93 bpm with baseline artifact.  Right bundle branch block.  Anterior Q waves consistent with prior infarct.  Inferior Q waves.  ECG (independently read by me): Sinus rhythm at 65 bpm.  PAC.  Right bundle branch block, inferior Q waves in 3 and aVF, old anterior myocardial infarction.  Lateral T wave abnormality.  Normal intervals.  June 16, 2018 ECG (independently read by me): Normal sinus rhythm at 84 bpm.  Right bundle branch block with repolarization changes.  Inferior Q waves consistent with prior infarct.  Anteroseptal Q waves consistent with prior anterior MI.  May 28, 2018 ECG (independently read by me): Normal sinus rhythm with sinus arrhythmia.  Right bundle branch block with repolarization changes.  Q waves consistent with anteroseptal MI and possible inferior MI no indication to adjust the pain upon his needs  July 17,2019 ECG (independently read by me): Normal sinus rhythm 89 bpm.  Right bundle branch block with repolarization changes.  Inferior Q waves and anterior  Q waves consistent with anterolateral infarct with possible inferior infarct  February 21, 2018 ECG (independently read by me): Normal sinus rhythm at 95 bpm, right bundle branch block with repolarization  changes.  Anterolateral MI.  QTc interval 477 ms  Jan 24, 2018 ECG (independently read by me): Sinus rhythm at 97 bpm.  Right bundle branch block with repolarization changes.  QTc interval 472 ms.  Anterolateral Q waves  Jan 07, 2018 ECG (independently read by me): Normal sinus rhythm at 88 bpm.  Right bundle branch block with repolarization changes.  Inferior Q waves.  T wave anteroseptally, possible anteroseptal MI undetermined.  November 2017 ECG (independently read by me): Normal sinus rhythm at 64 bpm.  Branch block with repolarization changes.  QTc interval 460 ms.  June 2016 ECG (independently read by me): Sinus bradycardia at 57 bpm, right bundle branch block with repositioning changes.  QTc interval 459 ms.  September 2015 ECG (independently read by me and (: Normal sinus rhythm.  Right bundle branch block with repolarization changes.  Ventricular rate 81.  02/25/2014 ECG Normal sinus rhythm.  Right bundle branch block with repolarization changes.  PR interval 148 ms; QTc interval 463 ms  Prior ECG: sinus rhythm at 76 beats per minute; right bundle-branch block with repolarization changes. PR interval 142 ms, QTC 481 ms.   LABS:  BMP Latest Ref Rng & Units 09/11/2021 09/10/2021 09/09/2021  Glucose 70 - 99 mg/dL 90 94 91  BUN 8 - 23 mg/dL 50(H) 48(H) 47(H)  Creatinine 0.61 - 1.24 mg/dL 2.29(H) 2.34(H) 2.09(H)  BUN/Creat Ratio 10 - 24 - - -  Sodium 135 - 145 mmol/L 140 140 137  Potassium 3.5 - 5.1 mmol/L 4.7 4.3 3.8  Chloride 98 - 111 mmol/L 106 105 109  CO2 22 - 32 mmol/L 25 24 23   Calcium 8.9 - 10.3 mg/dL 9.8 9.7 9.4   Hepatic Function Latest Ref Rng & Units 09/07/2021 09/04/2021 05/30/2021  Total Protein 6.5 - 8.1 g/dL 7.0 6.4(L) 6.5  Albumin 3.5 - 5.0 g/dL 4.0 3.7 4.3  AST 15 - 41 U/L 18 20 17   ALT 0 - 44 U/L 20 21 18   Alk Phosphatase 38 - 126 U/L 130(H) 99 109  Total Bilirubin 0.3 - 1.2 mg/dL 0.5 0.9 0.3   CBC Latest Ref Rng & Units 09/11/2021 09/10/2021 09/09/2021  WBC 4.0 - 10.5  K/uL 10.7(H) 10.7(H) 10.4  Hemoglobin 13.0 - 17.0 g/dL 14.0 13.0 12.3(L)  Hematocrit 39.0 - 52.0 % 43.8 42.1 39.2  Platelets 150 - 400 K/uL 181 162 157   Lab Results  Component Value Date   MCV 91.1 09/11/2021   MCV 92.7 09/10/2021   MCV 93.3 09/09/2021   Lab Results  Component Value Date   TSH 0.878 05/30/2021   Lipid Panel     Component Value Date/Time   CHOL 141 05/30/2021 1050   TRIG 211 (H) 09/08/2021 0408   HDL 40 05/30/2021 1050   CHOLHDL 3.5 05/30/2021 1050   CHOLHDL 4.8 08/09/2017 1117   VLDL 52 (H) 08/09/2017 1117   LDLCALC 77 05/30/2021 1050   INR result since initiation of Coumadin: 1:> 7.6;> >10;> 8.6 (Vit K administered) >4.3; no longer on Coumadin, now on Eliquis  RADIOLOGY: No results found.  IMPRESSION: 1. Chronic combined systolic and diastolic heart failure ([place])   2. Ischemic cardiomyopathy   3. Coronary artery disease involving native coronary artery of native heart with other form of angina pectoris ([place])   4. Hyperlipidemia LDL goal <70   5. Stage 3b chronic kidney disease ([place])   6. OSA (obstructive sleep apnea)   7. Tobacco abuse   8. Long term (current) use of anticoagulants    ASSESSMENT AND PLAN:  Mr. Paulsen is a 76 year old Caucasian male who suffered an acute coronary syndrome on 08/04/2012 when  he presented with subtotal occlusion of his RCA. He had diffuse disease beyond the subtotal occlusion and ultimately had successful insertion of a 3.25x32 mm Promus DES stent. He had concomitant CAD with 40-50% proximal LAD narrowing, 30% circumflex marginal stenoses. NMR lipoprofile after initiation of atorvastatin in January 2014 showed marked improvement in total cholesterol of 119 LDL 48 LDL particle #992. HDL was very low at 30 with an increased triglyceride at 203 and significantly reduced HDL particle #24.2. His insulin resistance score was elevated at 65.  He suffered an anterior STEMI in December 2018 and underwent successful stenting and several  days later he required staged intervention to circumflex and marginal vessel. 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  Warfarin was initially prescribed but due to significant issues with compliance and overmedication the decision was made that he would be safer using Eliquis even though he is on this for apical thrombus rather than atrial fibrillation.   He was subsequently found to have significant obstructive sleep apnea but stopped therapy and now usesnocturnal oxygen.  His echo Doppler study in June 2021 continue to show an EF at 35 to 40% with moderate dilation of LV internal dimensions.  There was mild LVH and grade 1 diastolic dysfunction.  There was evidence for moderate aortic sclerosis with mild mitral annular calcification.  There was no evidence for his previous apical thrombus.  His most recent echo study from June 05, 2021 continue to show reduced EF at 30 to 35%, mild mitral regurgitation, and mild to moderate aortic sclerosis without stenosis.  Previously, he had developed worsening renal function and hyperkalemia necessitating discontinuance of losartan and spironolactone.  Most recent creatinine had improved to 1.64 consistent with stage IIIb CKD.  BNP  was 612.  I have recommended initiation of low-dose hydralazine 10 mg twice a day with potential future titration depending upon response.  Follow-up laboratory was recommended and depending upon renal function may be a candidate for initiation of low-dose Entresto.  Unfortunately he is continues to smoke cigarettes, having started at age 79 and has been smoking for over 40 years.  He recently underwent a lung CT as part of screening he was felt to have lung-RADS 2 with tiny pulmonary nodules felt to be benign in appearance.  He continues to be on atorvastatin for hyperlipidemia with target LDL less than 70.  His ECG today shows stable normal sinus rhythm with previously noted right bundle branch block and left  anterior hemiblock.    Troy Sine, MD, [facility]  09/12/2021 8:16 AM

## 2021-09-07 ENCOUNTER — Other Ambulatory Visit: Payer: Self-pay

## 2021-09-07 ENCOUNTER — Emergency Department (HOSPITAL_COMMUNITY): Payer: Medicare Other

## 2021-09-07 ENCOUNTER — Inpatient Hospital Stay (HOSPITAL_COMMUNITY): Payer: Medicare Other

## 2021-09-07 ENCOUNTER — Inpatient Hospital Stay (HOSPITAL_COMMUNITY)
Admission: EM | Admit: 2021-09-07 | Discharge: 2021-09-11 | DRG: 871 | Disposition: A | Discharge status: Home Health | Payer: Medicare Other | Attending: Internal Medicine | Admitting: Internal Medicine

## 2021-09-07 DIAGNOSIS — F411 Generalized anxiety disorder: Secondary | ICD-10-CM | POA: Diagnosis present

## 2021-09-07 DIAGNOSIS — J189 Pneumonia, unspecified organism: Secondary | ICD-10-CM | POA: Diagnosis present

## 2021-09-07 DIAGNOSIS — I252 Old myocardial infarction: Secondary | ICD-10-CM

## 2021-09-07 DIAGNOSIS — J449 Chronic obstructive pulmonary disease, unspecified: Secondary | ICD-10-CM | POA: Diagnosis not present

## 2021-09-07 DIAGNOSIS — R338 Other retention of urine: Secondary | ICD-10-CM | POA: Clinically undetermined

## 2021-09-07 DIAGNOSIS — Z841 Family history of disorders of kidney and ureter: Secondary | ICD-10-CM

## 2021-09-07 DIAGNOSIS — I4891 Unspecified atrial fibrillation: Secondary | ICD-10-CM | POA: Diagnosis present

## 2021-09-07 DIAGNOSIS — I255 Ischemic cardiomyopathy: Secondary | ICD-10-CM | POA: Diagnosis present

## 2021-09-07 DIAGNOSIS — R0609 Other forms of dyspnea: Secondary | ICD-10-CM | POA: Diagnosis not present

## 2021-09-07 DIAGNOSIS — Z800 Family history of malignant neoplasm of digestive organs: Secondary | ICD-10-CM

## 2021-09-07 DIAGNOSIS — I272 Pulmonary hypertension, unspecified: Secondary | ICD-10-CM | POA: Diagnosis present

## 2021-09-07 DIAGNOSIS — I253 Aneurysm of heart: Secondary | ICD-10-CM | POA: Diagnosis present

## 2021-09-07 DIAGNOSIS — E785 Hyperlipidemia, unspecified: Secondary | ICD-10-CM | POA: Diagnosis present

## 2021-09-07 DIAGNOSIS — J9610 Chronic respiratory failure, unspecified whether with hypoxia or hypercapnia: Secondary | ICD-10-CM | POA: Diagnosis present

## 2021-09-07 DIAGNOSIS — R001 Bradycardia, unspecified: Secondary | ICD-10-CM | POA: Diagnosis present

## 2021-09-07 DIAGNOSIS — G9340 Encephalopathy, unspecified: Secondary | ICD-10-CM | POA: Insufficient documentation

## 2021-09-07 DIAGNOSIS — I2510 Atherosclerotic heart disease of native coronary artery without angina pectoris: Secondary | ICD-10-CM | POA: Diagnosis present

## 2021-09-07 DIAGNOSIS — Z8249 Family history of ischemic heart disease and other diseases of the circulatory system: Secondary | ICD-10-CM

## 2021-09-07 DIAGNOSIS — N183 Chronic kidney disease, stage 3 unspecified: Secondary | ICD-10-CM | POA: Diagnosis present

## 2021-09-07 DIAGNOSIS — I130 Hypertensive heart and chronic kidney disease with heart failure and stage 1 through stage 4 chronic kidney disease, or unspecified chronic kidney disease: Secondary | ICD-10-CM | POA: Diagnosis present

## 2021-09-07 DIAGNOSIS — E669 Obesity, unspecified: Secondary | ICD-10-CM | POA: Diagnosis present

## 2021-09-07 DIAGNOSIS — R6521 Severe sepsis with septic shock: Secondary | ICD-10-CM | POA: Diagnosis present

## 2021-09-07 DIAGNOSIS — Z6838 Body mass index (BMI) 38.0-38.9, adult: Secondary | ICD-10-CM

## 2021-09-07 DIAGNOSIS — I5021 Acute systolic (congestive) heart failure: Secondary | ICD-10-CM

## 2021-09-07 DIAGNOSIS — J9622 Acute and chronic respiratory failure with hypercapnia: Secondary | ICD-10-CM | POA: Diagnosis present

## 2021-09-07 DIAGNOSIS — R4182 Altered mental status, unspecified: Secondary | ICD-10-CM | POA: Diagnosis present

## 2021-09-07 DIAGNOSIS — F17210 Nicotine dependence, cigarettes, uncomplicated: Secondary | ICD-10-CM | POA: Diagnosis present

## 2021-09-07 DIAGNOSIS — A419 Sepsis, unspecified organism: Principal | ICD-10-CM

## 2021-09-07 DIAGNOSIS — Z9981 Dependence on supplemental oxygen: Secondary | ICD-10-CM

## 2021-09-07 DIAGNOSIS — Z955 Presence of coronary angioplasty implant and graft: Secondary | ICD-10-CM

## 2021-09-07 DIAGNOSIS — Z7901 Long term (current) use of anticoagulants: Secondary | ICD-10-CM

## 2021-09-07 DIAGNOSIS — J440 Chronic obstructive pulmonary disease with acute lower respiratory infection: Secondary | ICD-10-CM | POA: Diagnosis present

## 2021-09-07 DIAGNOSIS — I5023 Acute on chronic systolic (congestive) heart failure: Secondary | ICD-10-CM | POA: Diagnosis present

## 2021-09-07 DIAGNOSIS — I5022 Chronic systolic (congestive) heart failure: Secondary | ICD-10-CM | POA: Diagnosis present

## 2021-09-07 DIAGNOSIS — F32A Depression, unspecified: Secondary | ICD-10-CM | POA: Diagnosis present

## 2021-09-07 DIAGNOSIS — N179 Acute kidney failure, unspecified: Secondary | ICD-10-CM | POA: Diagnosis present

## 2021-09-07 DIAGNOSIS — I10 Essential (primary) hypertension: Secondary | ICD-10-CM

## 2021-09-07 DIAGNOSIS — N184 Chronic kidney disease, stage 4 (severe): Secondary | ICD-10-CM | POA: Diagnosis present

## 2021-09-07 DIAGNOSIS — E119 Type 2 diabetes mellitus without complications: Secondary | ICD-10-CM

## 2021-09-07 DIAGNOSIS — Z20822 Contact with and (suspected) exposure to covid-19: Secondary | ICD-10-CM | POA: Diagnosis present

## 2021-09-07 DIAGNOSIS — E1122 Type 2 diabetes mellitus with diabetic chronic kidney disease: Secondary | ICD-10-CM | POA: Diagnosis present

## 2021-09-07 DIAGNOSIS — Z7982 Long term (current) use of aspirin: Secondary | ICD-10-CM

## 2021-09-07 DIAGNOSIS — J9621 Acute and chronic respiratory failure with hypoxia: Secondary | ICD-10-CM | POA: Diagnosis present

## 2021-09-07 DIAGNOSIS — G4733 Obstructive sleep apnea (adult) (pediatric): Secondary | ICD-10-CM | POA: Diagnosis present

## 2021-09-07 DIAGNOSIS — J810 Acute pulmonary edema: Secondary | ICD-10-CM | POA: Insufficient documentation

## 2021-09-07 DIAGNOSIS — Z7984 Long term (current) use of oral hypoglycemic drugs: Secondary | ICD-10-CM

## 2021-09-07 DIAGNOSIS — Z8673 Personal history of transient ischemic attack (TIA), and cerebral infarction without residual deficits: Secondary | ICD-10-CM

## 2021-09-07 DIAGNOSIS — R339 Retention of urine, unspecified: Secondary | ICD-10-CM | POA: Diagnosis present

## 2021-09-07 LAB — MAGNESIUM
Magnesium: 1.9 mg/dL (ref 1.7–2.4)
Magnesium: 1.7 mg/dL (ref 1.7–2.4)

## 2021-09-07 LAB — PHOSPHORUS
Phosphorus: 4.9 mg/dL — ABNORMAL HIGH (ref 2.5–4.6)
Phosphorus: 2.1 mg/dL — ABNORMAL LOW (ref 2.5–4.6)

## 2021-09-07 LAB — CBC WITH DIFFERENTIAL/PLATELET
Abs Immature Granulocytes: 0.14 10*3/uL — ABNORMAL HIGH (ref 0.00–0.07)
Basophils Absolute: 0.1 10*3/uL (ref 0.0–0.1)
Basophils Relative: 1 %
Eosinophils Absolute: 0.3 10*3/uL (ref 0.0–0.5)
Eosinophils Relative: 2 %
HCT: 45.6 % (ref 39.0–52.0)
Hemoglobin: 13.8 g/dL (ref 13.0–17.0)
Immature Granulocytes: 1 %
Lymphocytes Relative: 24 %
Lymphs Abs: 3.6 10*3/uL (ref 0.7–4.0)
MCH: 29.3 pg (ref 26.0–34.0)
MCHC: 30.3 g/dL (ref 30.0–36.0)
MCV: 96.8 fL (ref 80.0–100.0)
Monocytes Absolute: 1.3 10*3/uL — ABNORMAL HIGH (ref 0.1–1.0)
Monocytes Relative: 9 %
Neutro Abs: 9.7 10*3/uL — ABNORMAL HIGH (ref 1.7–7.7)
Neutrophils Relative %: 63 %
Platelets: 244 10*3/uL (ref 150–400)
RBC: 4.71 MIL/uL (ref 4.22–5.81)
RDW: 14.1 % (ref 11.5–15.5)
WBC: 15.2 10*3/uL — ABNORMAL HIGH (ref 4.0–10.5)
nRBC: 0 % (ref 0.0–0.2)

## 2021-09-07 LAB — BRAIN NATRIURETIC PEPTIDE: B Natriuretic Peptide: 898.3 pg/mL — ABNORMAL HIGH (ref 0.0–100.0)

## 2021-09-07 LAB — I-STAT ARTERIAL BLOOD GAS, ED
Acid-base deficit: 5 mmol/L — ABNORMAL HIGH (ref 0.0–2.0)
Acid-base deficit: 5 mmol/L — ABNORMAL HIGH (ref 0.0–2.0)
Acid-base deficit: 5 mmol/L — ABNORMAL HIGH (ref 0.0–2.0)
Bicarbonate: 25.8 mmol/L (ref 20.0–28.0)
Bicarbonate: 22.4 mmol/L (ref 20.0–28.0)
Bicarbonate: 21 mmol/L (ref 20.0–28.0)
Calcium, Ion: 1.35 mmol/L (ref 1.15–1.40)
Calcium, Ion: 1.35 mmol/L (ref 1.15–1.40)
Calcium, Ion: 1.32 mmol/L (ref 1.15–1.40)
HCT: 37 % — ABNORMAL LOW (ref 39.0–52.0)
HCT: 33 % — ABNORMAL LOW (ref 39.0–52.0)
HCT: 34 % — ABNORMAL LOW (ref 39.0–52.0)
Hemoglobin: 12.6 g/dL — ABNORMAL LOW (ref 13.0–17.0)
Hemoglobin: 11.2 g/dL — ABNORMAL LOW (ref 13.0–17.0)
Hemoglobin: 11.6 g/dL — ABNORMAL LOW (ref 13.0–17.0)
O2 Saturation: 100 %
O2 Saturation: 94 %
O2 Saturation: 100 %
Patient temperature: 97.1
Potassium: 4.3 mmol/L (ref 3.5–5.1)
Potassium: 4.3 mmol/L (ref 3.5–5.1)
Potassium: 4.2 mmol/L (ref 3.5–5.1)
Sodium: 141 mmol/L (ref 135–145)
Sodium: 140 mmol/L (ref 135–145)
Sodium: 140 mmol/L (ref 135–145)
TCO2: 28 mmol/L (ref 22–32)
TCO2: 24 mmol/L (ref 22–32)
TCO2: 22 mmol/L (ref 22–32)
pCO2 arterial: 78.6 mmHg (ref 32.0–48.0)
pCO2 arterial: 50.9 mmHg — ABNORMAL HIGH (ref 32.0–48.0)
pCO2 arterial: 42.9 mmHg (ref 32.0–48.0)
pH, Arterial: 7.119 — CL (ref 7.350–7.450)
pH, Arterial: 7.252 — ABNORMAL LOW (ref 7.350–7.450)
pH, Arterial: 7.297 — ABNORMAL LOW (ref 7.350–7.450)
pO2, Arterial: 345 mmHg — ABNORMAL HIGH (ref 83.0–108.0)
pO2, Arterial: 85 mmHg (ref 83.0–108.0)
pO2, Arterial: 236 mmHg — ABNORMAL HIGH (ref 83.0–108.0)

## 2021-09-07 LAB — ECHOCARDIOGRAM COMPLETE
AR max vel: 2.5 cm2
AV Area VTI: 2.33 cm2
AV Area mean vel: 2.35 cm2
AV Mean grad: 3 mmHg
AV Peak grad: 5 mmHg
Ao pk vel: 1.12 m/s
Area-P 1/2: 4.41 cm2
Height: 69 in
S' Lateral: 5 cm
Weight: 4137.59 oz

## 2021-09-07 LAB — PROTIME-INR
INR: 1.1 (ref 0.8–1.2)
Prothrombin Time: 14.5 seconds (ref 11.4–15.2)

## 2021-09-07 LAB — GLUCOSE, CAPILLARY
Glucose-Capillary: 105 mg/dL — ABNORMAL HIGH (ref 70–99)
Glucose-Capillary: 92 mg/dL (ref 70–99)
Glucose-Capillary: 98 mg/dL (ref 70–99)
Glucose-Capillary: 85 mg/dL (ref 70–99)

## 2021-09-07 LAB — RESP PANEL BY RT-PCR (FLU A&B, COVID) ARPGX2
Influenza A by PCR: NEGATIVE
Influenza B by PCR: NEGATIVE
SARS Coronavirus 2 by RT PCR: NEGATIVE

## 2021-09-07 LAB — COMPREHENSIVE METABOLIC PANEL
ALT: 20 U/L (ref 0–44)
AST: 18 U/L (ref 15–41)
Albumin: 4 g/dL (ref 3.5–5.0)
Alkaline Phosphatase: 130 U/L — ABNORMAL HIGH (ref 38–126)
Anion gap: 9 (ref 5–15)
BUN: 44 mg/dL — ABNORMAL HIGH (ref 8–23)
CO2: 25 mmol/L (ref 22–32)
Calcium: 9.3 mg/dL (ref 8.9–10.3)
Chloride: 108 mmol/L (ref 98–111)
Creatinine, Ser: 2.27 mg/dL — ABNORMAL HIGH (ref 0.61–1.24)
GFR, Estimated: 29 mL/min — ABNORMAL LOW (ref >60)
Glucose, Bld: 180 mg/dL — ABNORMAL HIGH (ref 70–99)
Potassium: 4.3 mmol/L (ref 3.5–5.1)
Sodium: 142 mmol/L (ref 135–145)
Total Bilirubin: 0.5 mg/dL (ref 0.3–1.2)
Total Protein: 7 g/dL (ref 6.5–8.1)

## 2021-09-07 LAB — I-STAT CHEM 8, ED
BUN: 45 mg/dL — ABNORMAL HIGH (ref 8–23)
Calcium, Ion: 1.37 mmol/L (ref 1.15–1.40)
Chloride: 110 mmol/L (ref 98–111)
Creatinine, Ser: 1.9 mg/dL — ABNORMAL HIGH (ref 0.61–1.24)
Glucose, Bld: 176 mg/dL — ABNORMAL HIGH (ref 70–99)
HCT: 44 % (ref 39.0–52.0)
Hemoglobin: 15 g/dL (ref 13.0–17.0)
Potassium: 4.3 mmol/L (ref 3.5–5.1)
Sodium: 142 mmol/L (ref 135–145)
TCO2: 26 mmol/L (ref 22–32)

## 2021-09-07 LAB — HEMOGLOBIN A1C
Hgb A1c MFr Bld: 5.5 % (ref 4.8–5.6)
Mean Plasma Glucose: 111.15 mg/dL

## 2021-09-07 LAB — APTT: aPTT: 31 seconds (ref 24–36)

## 2021-09-07 LAB — MRSA NEXT GEN BY PCR, NASAL: MRSA by PCR Next Gen: NOT DETECTED

## 2021-09-07 LAB — CBG MONITORING, ED: Glucose-Capillary: 131 mg/dL — ABNORMAL HIGH (ref 70–99)

## 2021-09-07 LAB — LACTIC ACID, PLASMA: Lactic Acid, Venous: 1.6 mmol/L (ref 0.5–1.9)

## 2021-09-07 MED ORDER — SODIUM CHLORIDE 0.9 % IV SOLN
250.0000 mL | INTRAVENOUS | Status: DC
  Administered 2021-09-09: 250 mL via INTRAVENOUS

## 2021-09-07 MED ORDER — CHLORHEXIDINE GLUCONATE CLOTH 2 % EX PADS
6.0000 | MEDICATED_PAD | Freq: Every day | CUTANEOUS | Status: DC
  Administered 2021-09-07 – 2021-09-09 (×3): 6 via TOPICAL

## 2021-09-07 MED ORDER — CARVEDILOL 12.5 MG PO TABS: 12.5000 mg | ORAL_TABLET | Freq: Two times a day (BID) | ORAL | Status: DC

## 2021-09-07 MED ORDER — DOCUSATE SODIUM 50 MG/5ML PO LIQD: 100.0000 mg | Freq: Two times a day (BID) | ORAL | Status: DC | PRN

## 2021-09-07 MED ORDER — POTASSIUM CHLORIDE 20 MEQ PO PACK
40.0000 meq | PACK | Freq: Once | ORAL | Status: AC
  Administered 2021-09-07: 40 meq
  Filled 2021-09-07: qty 2

## 2021-09-07 MED ORDER — HEPARIN SODIUM (PORCINE) 5000 UNIT/ML IJ SOLN
5000.0000 [IU] | Freq: Three times a day (TID) | INTRAMUSCULAR | Status: DC
  Administered 2021-09-07: 5000 [IU] via SUBCUTANEOUS
  Filled 2021-09-07: qty 1

## 2021-09-07 MED ORDER — PERFLUTREN LIPID MICROSPHERE
1.0000 mL | INTRAVENOUS | Status: AC | PRN
  Administered 2021-09-07: 5 mL via INTRAVENOUS
  Filled 2021-09-07: qty 10

## 2021-09-07 MED ORDER — SODIUM CHLORIDE 0.9 % IV SOLN
2.0000 g | Freq: Once | INTRAVENOUS | Status: AC
  Administered 2021-09-07: 2 g via INTRAVENOUS
  Filled 2021-09-07: qty 2

## 2021-09-07 MED ORDER — VANCOMYCIN HCL 1250 MG/250ML IV SOLN
1250.0000 mg | INTRAVENOUS | Status: DC
  Administered 2021-09-08: 1250 mg via INTRAVENOUS
  Filled 2021-09-07: qty 250

## 2021-09-07 MED ORDER — VANCOMYCIN HCL 2000 MG/400ML IV SOLN
2000.0000 mg | Freq: Once | INTRAVENOUS | Status: AC
  Administered 2021-09-07: 2000 mg via INTRAVENOUS
  Filled 2021-09-07: qty 400

## 2021-09-07 MED ORDER — NOREPINEPHRINE 4 MG/250ML-% IV SOLN: 0.0000 ug/min | INTRAVENOUS | Status: DC

## 2021-09-07 MED ORDER — PROSOURCE TF PO LIQD
45.0000 mL | Freq: Two times a day (BID) | ORAL | Status: DC
  Administered 2021-09-07 – 2021-09-08 (×2): 45 mL
  Filled 2021-09-07 (×4): qty 45

## 2021-09-07 MED ORDER — INSULIN ASPART 100 UNIT/ML IJ SOLN
0.0000 [IU] | INTRAMUSCULAR | Status: DC
  Administered 2021-09-08: 3 [IU] via SUBCUTANEOUS
  Administered 2021-09-08 – 2021-09-09 (×2): 2 [IU] via SUBCUTANEOUS

## 2021-09-07 MED ORDER — PANTOPRAZOLE SODIUM 40 MG IV SOLR
40.0000 mg | Freq: Every day | INTRAVENOUS | Status: DC
  Administered 2021-09-07: 40 mg via INTRAVENOUS
  Filled 2021-09-07: qty 40

## 2021-09-07 MED ORDER — APIXABAN 5 MG PO TABS
5.0000 mg | ORAL_TABLET | Freq: Two times a day (BID) | ORAL | Status: DC
  Administered 2021-09-08: 5 mg
  Filled 2021-09-07: qty 1

## 2021-09-07 MED ORDER — VANCOMYCIN VARIABLE DOSE PER UNSTABLE RENAL FUNCTION (PHARMACIST DOSING): Status: DC

## 2021-09-07 MED ORDER — ALBUTEROL SULFATE (2.5 MG/3ML) 0.083% IN NEBU: 2.5000 mg | INHALATION_SOLUTION | RESPIRATORY_TRACT | Status: DC | PRN

## 2021-09-07 MED ORDER — VITAL HIGH PROTEIN PO LIQD
1000.0000 mL | ORAL | Status: DC
  Administered 2021-09-07: 1000 mL
  Filled 2021-09-07: qty 1000

## 2021-09-07 MED ORDER — POLYETHYLENE GLYCOL 3350 17 G PO PACK: 17.0000 g | PACK | Freq: Every day | ORAL | Status: DC | PRN

## 2021-09-07 MED ORDER — FENTANYL 2500MCG IN NS 250ML (10MCG/ML) PREMIX INFUSION
0.0000 ug/h | INTRAVENOUS | Status: DC
  Administered 2021-09-07: 25 ug/h via INTRAVENOUS
  Administered 2021-09-07: 200 ug/h via INTRAVENOUS
  Filled 2021-09-07 (×2): qty 250

## 2021-09-07 MED ORDER — ASPIRIN 81 MG PO CHEW
81.0000 mg | CHEWABLE_TABLET | Freq: Every day | ORAL | Status: DC
  Administered 2021-09-07 – 2021-09-08 (×2): 81 mg
  Filled 2021-09-07 (×2): qty 1

## 2021-09-07 MED ORDER — FUROSEMIDE 10 MG/ML IJ SOLN
40.0000 mg | Freq: Four times a day (QID) | INTRAMUSCULAR | Status: AC
  Administered 2021-09-07 (×2): 40 mg via INTRAVENOUS
  Filled 2021-09-07 (×2): qty 4

## 2021-09-07 MED ORDER — SODIUM CHLORIDE 0.9 % IV SOLN: INTRAVENOUS | Status: DC | PRN

## 2021-09-07 MED ORDER — PROPOFOL 1000 MG/100ML IV EMUL
0.0000 ug/kg/min | INTRAVENOUS | Status: DC
  Administered 2021-09-07: 15 ug/kg/min via INTRAVENOUS

## 2021-09-07 MED ORDER — SODIUM CHLORIDE 0.9 % IV SOLN
2.0000 g | Freq: Two times a day (BID) | INTRAVENOUS | Status: DC
  Administered 2021-09-07 – 2021-09-09 (×4): 2 g via INTRAVENOUS
  Filled 2021-09-07 (×4): qty 2

## 2021-09-07 MED ORDER — HYDRALAZINE HCL 10 MG PO TABS
10.0000 mg | ORAL_TABLET | Freq: Three times a day (TID) | ORAL | Status: DC
  Filled 2021-09-07: qty 1

## 2021-09-07 MED ORDER — ARFORMOTEROL TARTRATE 15 MCG/2ML IN NEBU
15.0000 ug | INHALATION_SOLUTION | Freq: Two times a day (BID) | RESPIRATORY_TRACT | Status: DC
  Administered 2021-09-07 – 2021-09-11 (×7): 15 ug via RESPIRATORY_TRACT
  Filled 2021-09-07 (×7): qty 2

## 2021-09-07 MED ORDER — FENTANYL CITRATE PF 50 MCG/ML IJ SOSY: 25.0000 ug | PREFILLED_SYRINGE | INTRAMUSCULAR | Status: DC | PRN

## 2021-09-07 MED ORDER — FENTANYL CITRATE (PF) 100 MCG/2ML IJ SOLN
25.0000 ug | INTRAMUSCULAR | Status: DC | PRN
  Administered 2021-09-08: 100 ug via INTRAVENOUS

## 2021-09-07 MED ORDER — BUDESONIDE 0.5 MG/2ML IN SUSP
0.5000 mg | Freq: Two times a day (BID) | RESPIRATORY_TRACT | Status: DC
  Administered 2021-09-07 – 2021-09-11 (×7): 0.5 mg via RESPIRATORY_TRACT
  Filled 2021-09-07 (×7): qty 2

## 2021-09-07 MED ORDER — HEPARIN (PORCINE) 25000 UT/250ML-% IV SOLN
1500.0000 [IU]/h | INTRAVENOUS | Status: AC
  Administered 2021-09-07: 1500 [IU]/h via INTRAVENOUS
  Filled 2021-09-07: qty 250

## 2021-09-07 MED ORDER — VANCOMYCIN HCL IN DEXTROSE 1-5 GM/200ML-% IV SOLN: 1000.0000 mg | Freq: Once | INTRAVENOUS | Status: DC

## 2021-09-07 MED ORDER — NOREPINEPHRINE 4 MG/250ML-% IV SOLN
2.0000 ug/min | INTRAVENOUS | Status: DC
  Administered 2021-09-07: 10 ug/min via INTRAVENOUS
  Filled 2021-09-07: qty 250

## 2021-09-07 MED ORDER — DOCUSATE SODIUM 100 MG PO CAPS: 100.0000 mg | ORAL_CAPSULE | Freq: Two times a day (BID) | ORAL | Status: DC | PRN

## 2021-09-07 MED ORDER — FUROSEMIDE 10 MG/ML IJ SOLN
40.0000 mg | Freq: Two times a day (BID) | INTRAMUSCULAR | Status: DC
  Filled 2021-09-07: qty 4

## 2021-09-07 MED ORDER — POLYETHYLENE GLYCOL 3350 17 G PO PACK
17.0000 g | PACK | Freq: Every day | ORAL | Status: DC
  Administered 2021-09-08: 17 g
  Filled 2021-09-07 (×2): qty 1

## 2021-09-07 MED ORDER — REVEFENACIN 175 MCG/3ML IN SOLN
175.0000 ug | Freq: Every day | RESPIRATORY_TRACT | Status: DC
  Administered 2021-09-08 – 2021-09-09 (×2): 175 ug via RESPIRATORY_TRACT
  Filled 2021-09-07 (×5): qty 3

## 2021-09-07 MED ORDER — DOCUSATE SODIUM 50 MG/5ML PO LIQD
100.0000 mg | Freq: Two times a day (BID) | ORAL | Status: DC
  Administered 2021-09-07 – 2021-09-08 (×3): 100 mg
  Filled 2021-09-07 (×3): qty 10

## 2021-09-07 MED ORDER — LACTATED RINGERS IV SOLN: INTRAVENOUS | Status: DC

## 2021-09-07 MED ORDER — APIXABAN 5 MG PO TABS
5.0000 mg | ORAL_TABLET | Freq: Two times a day (BID) | ORAL | Status: DC
  Administered 2021-09-07 (×2): 5 mg via ORAL
  Filled 2021-09-07 (×2): qty 1

## 2021-09-07 MED ORDER — PROPOFOL 1000 MG/100ML IV EMUL
5.0000 ug/kg/min | INTRAVENOUS | Status: DC
  Filled 2021-09-07: qty 100

## 2021-09-07 MED ORDER — CHLORHEXIDINE GLUCONATE 0.12% ORAL RINSE (MEDLINE KIT)
15.0000 mL | Freq: Two times a day (BID) | OROMUCOSAL | Status: DC
  Administered 2021-09-07 – 2021-09-08 (×2): 15 mL via OROMUCOSAL

## 2021-09-07 MED ORDER — ORAL CARE MOUTH RINSE
15.0000 mL | OROMUCOSAL | Status: DC
  Administered 2021-09-07 – 2021-09-08 (×6): 15 mL via OROMUCOSAL

## 2021-09-07 NOTE — H&P (Signed)
NAME:  Bobby [name], MRN:  [id number], DOB:  [birth year], LOS: 0 ADMISSION DATE:  09/07/2021, CONSULTATION DATE:  09/07/21 REFERRING MD:  Dina Rich CHIEF COMPLAINT:  Dyspnea   History of Present Illness:  Bobby [name] is a 76 y.o. male who has a PMH as below.  He presented to [facility] ED 1/5 with dyspnea and respiratory distress.  Had been seen at [facility] 1/1 and discharged with abx for possible PNA.  Then seen again at [facility] ED 1/2 for cough, dyspnea again but opted to leave.  He is chronically on 3L O2 nocturnally and PRN and on EMS arrival, he had sats in 60's.  He was placed on CPAP but failed and in ED, required intubation.  ABG showed hypoxic and hypercapnic respiratory failure.  CXR demonstrated pulmonary edema and probable RML infiltrate.  Pertinent  Medical History:  has Cerebral infarction, unspecified ([place]); HTN (hypertension); Tobacco abuse; History of stroke; Panic attack, History of..; CAD ,residual 40-50% LAD; Hyperlipidemia LDL goal <70; Type 2 diabetes mellitus ([place]); RBBB; COPD (chronic obstructive pulmonary disease) ([place]); Chronic back pain, on disability; Obesity (BMI 30-39.9); Shortness of breath on exertion; disease views combustion of polyvinyl chloride 1; PVC's (premature ventricular contractions); Erectile dysfunction; Hypertensive heart disease; Shoulder pain; Acute ST elevation myocardial infarction (STEMI) involving left anterior descending (LAD) coronary artery ([place]); Acute on chronic systolic congestive heart failure ([place]); Acute systolic CHF (congestive heart failure) ([place]); Anxiety; Depression; Epistaxis; GAD (generalized anxiety disorder); Grief at loss of child; History of MI (myocardial infarction); Insomnia; Nicotine dependence; Pain; CRI (chronic renal insufficiency), stage 3 (moderate) (HCC); and Acute on chronic respiratory failure with hypoxemia (HCC) on their problem list.  Significant Hospital Events: Including procedures, antibiotic start and stop dates in addition  to other pertinent events   1/5 admit.  Interim History / Subjective:  Just intubated, not responsive.  Objective:  Blood pressure 137/77, pulse 91, temperature (!) 97.1 F (36.2 C), temperature source Rectal, resp. rate 20, SpO2 100 %.    Vent Mode: PRVC FiO2 (%):  [100 %] 100 % Set Rate:  [20 bmp] 20 bmp Vt Set:  [560 mL] 560 mL PEEP:  [5 cmH20] 5 cmH20 Plateau Pressure:  [27 cmH20] 27 cmH20  No intake or output data in the 24 hours ending 09/07/21 0629 There were no vitals filed for this visit.  Examination: General: Adult male, resting in bed, in NAD. Neuro: Sedated and paralyzed. HEENT: [place]/AT. Sclerae anicteric. ETT in place. Cardiovascular: RRR, no M/R/G.  Lungs: Respirations even and unlabored.  CTA bilaterally, No W/R/R. Abdomen: Obese BS x 4, soft, NT/ND.  Musculoskeletal: No gross deformities, 1+ edema.  Skin: Intact, warm, no rashes.  Labs/imaging personally reviewed:  CXR 1/5 > edema and RML infiltrate.  Assessment & Plan:   Acute on chronic hypoxic with acute hypercapnic respiratory failure - failed NIMV and required intubation in ED. Hx COPD on chronic 3L O2 nocturnally and PRN, tobacco dependence, OSA on CPAP (sleep study 2019 with AHI22.2 and RDI 25.4, auto CPAP 8-15 recommended). - Full vent support. - SBT as able. - Bronchial hygiene. - Follow RVP, CXR. - Resume nocturnal CPAP once extubated. - Tobacco cessation counseling.  Acute pulmonary edema. - 40mg  Lasix x 2 and repeat as needed. - Follow CXR.  Probable RML infiltrate. - Empiric vanc / cefepime for now, narrow as able. - Follow cultures.  AKI. - Diuresis trial as above. - Follow BMP.  Hx CAD s/p DES 2013, HLD, HTN, MI. - Continue home  ASA. - Hold home Amlodipine, Atorvastatin, Carvedilol, Hydralazine.  Hx DM. - Hold home Metformin. - SSI.  Hx TIA (on Eliquis for prophylaxis). - Heparin in lieu of home Eliquis.   Best practice (evaluated daily):  Diet/type: NPO DVT  prophylaxis: systemic heparin GI prophylaxis: PPI Lines: N/A Foley:  N/A Code Status:  full code Last date of multidisciplinary goals of care discussion: None.  Labs   CBC: Recent Labs  Lab 09/03/21 0959 09/04/21 1843 09/07/21 0600 09/07/21 0604  WBC 9.6 9.1 15.2*  --   NEUTROABS 6.9 7.5 9.7*  --   HGB 13.3 13.0 13.8 15.0  HCT 40.8 42.3 45.6 44.0  MCV 89.7 94.2 96.8  --   PLT 172 183 244  --     Basic Metabolic Panel: Recent Labs  Lab 09/03/21 0959 09/04/21 1843 09/07/21 0604  NA 138 139 142  K 3.8 5.0 4.3  CL 107 109 110  CO2 22 23  --   GLUCOSE 110* 137* 176*  BUN 28* 28* 45*  CREATININE 1.64* 1.87* 1.90*  CALCIUM 9.7 9.5  --    GFR: Estimated Creatinine Clearance: 42.4 mL/min (A) (by C-G formula based on SCr of 1.9 mg/dL (H)). Recent Labs  Lab 09/03/21 0959 09/04/21 1843 09/07/21 0600  WBC 9.6 9.1 15.2*    Liver Function Tests: Recent Labs  Lab 09/04/21 1843  AST 20  ALT 21  ALKPHOS 99  BILITOT 0.9  PROT 6.4*  ALBUMIN 3.7   No results for input(s): LIPASE, AMYLASE in the last 168 hours. No results for input(s): AMMONIA in the last 168 hours.  ABG    Component Value Date/Time   PHART 7.404 03/24/2009 1452   PCO2ART 40.8 03/24/2009 1452   PO2ART 67.0 (L) 03/24/2009 1452   HCO3 25.5 (H) 03/24/2009 1452   TCO2 26 09/07/2021 0604   O2SAT 93.0 03/24/2009 1452     Coagulation Profile: No results for input(s): INR, PROTIME in the last 168 hours.  Cardiac Enzymes: No results for input(s): CKTOTAL, CKMB, CKMBINDEX, TROPONINI in the last 168 hours.  HbA1C: Hgb A1c MFr Bld  Date/Time Value Ref Range Status  06/18/2017 07:09 AM 6.7 (H) 4.8 - 5.6 % Final    Comment:    (NOTE) Pre diabetes:          5.7%-6.4% Diabetes:              >6.4% Glycemic control for   <7.0% adults with diabetes   08/02/2016 10:28 AM 6.0 (H) <5.7 % Final    Comment:      For someone without known diabetes, a hemoglobin A1c value between 5.7% and 6.4% is  consistent with prediabetes and should be confirmed with a follow-up test.   For someone with known diabetes, a value <7% indicates that their diabetes is well controlled. A1c targets should be individualized based on duration of diabetes, age, co-morbid conditions and other considerations.   This assay result is consistent with an increased risk of diabetes.   Currently, no consensus exists regarding use of hemoglobin A1c for diagnosis of diabetes in children.       CBG: No results for input(s): GLUCAP in the last 168 hours.  Review of Systems:   Unable to obtain as pt is encephalopathic.  Past Medical History:  He,  has a past medical history of CAD (coronary artery disease),residual non obstructive disease, DM (diabetes mellitus), type 2 new diagnosis (08/05/2012), ED (erectile dysfunction), History of echocardiogram, Hyperlipidemia (08/05/2012), Hypertensive heart disease, MI (myocardial  infarction) ([place]) (08/04/2012), Panic attack, Pulmonary HTN ([place]), S/P coronary artery stent placement, to RCA Promus DES (08/05/2012), Stroke ([place]), and Tobacco abuse.   Surgical History:   Past Surgical History:  Procedure Laterality Date   CARDIAC CATHETERIZATION  08/04/12   PCI to RCA with DES   CORONARY STENT INTERVENTION N/A 08/12/2017   Procedure: CORONARY STENT INTERVENTION;  Surgeon: Martinique, Peter M, MD;  Location: [facility];  Service: Cardiovascular;  Laterality: N/A;   CORONARY/GRAFT ACUTE MI REVASCULARIZATION N/A 08/09/2017   Procedure: Coronary/Graft Acute MI Revascularization;  Surgeon: Belva Crome, MD;  Location: [place];  Service: Cardiovascular;  Laterality: N/A;   LEFT HEART CATH AND CORONARY ANGIOGRAPHY N/A 08/09/2017   Procedure: LEFT HEART CATH AND CORONARY ANGIOGRAPHY;  Surgeon: Belva Crome, MD;  Location: [place] [facility];  Service: Cardiovascular;  Laterality: N/A;   LEFT HEART CATHETERIZATION WITH CORONARY ANGIOGRAM N/A 08/04/2012   Procedure:  LEFT HEART CATHETERIZATION WITH CORONARY ANGIOGRAM;  Surgeon: Troy Sine, MD;  [facility] C[facility] LAB;  Service: Cardiovascular;  Laterality: N/A;   LEG SURGERY Left    rod placed for fracture repair   PENILE PROSTHESIS IMPLANT N/A 06/03/2014   Procedure: IMPLANT PENILE PROTHESIS INFLATABLE;  Surgeon: Ailene Rud, MD;  Location: [facility];  Service: Urology;  Laterality: N/A;  with penile block--0.5% marcaine plain   PERCUTANEOUS CORONARY STENT INTERVENTION (PCI-S) Right 08/04/2012   Procedure: PERCUTANEOUS CORONARY STENT INTERVENTION (PCI-S);  Surgeon: Troy Sine, MD;  [facility]: [facility] CATH LAB;  Service: Cardiovascular;  Laterality: Right;   THUMB ARTHROSCOPY Left    thumb joint replaced     Social History:   reports that he has been smoking cigarettes. He has a 45.00 pack-year smoking history. He has never used smokeless tobacco. He reports that he does not currently use alcohol. He reports that he does not use drugs.   Family History:  His family history includes Heart attack in his paternal grandmother; Heart disease in his maternal grandmother; Kidney disease in his father; Stomach cancer in his maternal grandfather.   Allergies Allergies  Allergen Reactions   Hydrocodone Itching     Home Medications  Prior to Admission medications   Medication Sig Start Date End Date Taking? Authorizing Provider  albuterol (PROVENTIL HFA;VENTOLIN HFA) 108 (90 Base) MCG/ACT inhaler Inhale 2 puffs into the lungs every 6 (six) hours as needed for wheezing or shortness of breath. 08/21/17   Bhagat, Crista Luria, PA  albuterol (PROVENTIL) (2.5 MG/3ML) 0.083% nebulizer solution Take 5 mg by nebulization every 4 (four) hours as needed. For wheezing or SOB    [provider]  amLODipine (NORVASC) 5 MG tablet TAKE ONE TABLET BY MOUTH DAILY 07/04/21   Troy Sine, MD  apixaban (ELIQUIS) 5 MG TABS tablet Take 1 tablet (5 mg total) by mouth 2 (two) times daily. 02/19/19   Troy Sine, MD   aspirin EC 81 MG tablet Take 1 tablet (81 mg total) by mouth daily. Swallow whole. 11/09/20   Erlene Quan, PA-C  atorvastatin (LIPITOR) 80 MG tablet Take 1 tablet (80 mg total) by mouth daily at 6 PM. 03/11/18   Troy Sine, MD  benzonatate (TESSALON) 100 MG capsule Take 100-200 mg by mouth 3 (three) times daily as needed. 01/03/20   [provider]  BREZTRI AEROSPHERE 160-9-4.8 MCG/ACT AERO Inhale 2 puffs into the lungs 2 (two) times daily. 01/06/20   [provider]  buPROPion (WELLBUTRIN XL) 300 MG  24 hr tablet Take 300 mg by mouth daily.    [provider]  carvedilol (COREG) 25 MG tablet TAKE ONE TABLET BY MOUTH TWICE DAILY 07/04/21   Troy Sine, MD  clonazePAM (KLONOPIN) 0.5 MG tablet Take 1 mg by mouth 2 (two) times daily.  06/01/17   [provider]  Coenzyme Q10 (COQ-10) 100 MG CAPS Take 1 capsule by mouth daily.     [provider]  COLCRYS 0.6 MG tablet Take 0.6 mg by mouth daily. 03/21/17   [provider]  doxycycline (VIBRAMYCIN) 100 MG capsule Take 1 capsule (100 mg total) by mouth 2 (two) times daily. 09/03/21   Hayden Rasmussen, MD  hydrALAZINE (APRESOLINE) 10 MG tablet Take 1 tablet (10 mg total) by mouth 2 (two) times daily. 09/05/21   Troy Sine, MD  metFORMIN (GLUCOPHAGE-XR) 500 MG 24 hr tablet Take 500 mg by mouth daily. 03/21/17   [provider]  nitroGLYCERIN (NITROSTAT) 0.4 MG SL tablet DISSOLVE 1 TABLET UNDER THE TONGUE EVERY 5 MINUTES AS NEEDED FOR CHEST PAIN. DO NOT EXCEED A TOTAL OF 3 DOSES IN 15 MINUTES. 07/04/21   Troy Sine, MD  Omega-3 Fatty Acids (FISH OIL PO) Take 1 tablet by mouth daily.    [provider]  oxyCODONE-acetaminophen (PERCOCET) 10-325 MG per tablet Take 1 tablet by mouth 3 (three) times daily.    [provider]  predniSONE (DELTASONE) 10 MG tablet Take 10 mg by mouth daily. 11/05/20   [provider]  sertraline (ZOLOFT) 100 MG tablet Take 100 mg by mouth  daily.    [provider]     Critical care time: 40 min.   Montey Hora, [facility] For pager details, please see AMION or use Epic chat  After 1900, please call [facility] for cross coverage needs 09/07/2021, 6:29 AM

## 2021-09-07 NOTE — Progress Notes (Signed)
Brief Nutrition Note  RD consulted for enteral nutrition initiation and management. Pt extubated this morning and OG tube removed. Diet advanced to Heart Healthy this afternoon. Pt without enteral access and does not need tube feeds ordered at this time.  RD will sign off. Please re-consult as needed.  Gustavus Bryant, MS, RD, LDN Inpatient Clinical Dietitian Please see AMiON for contact information.

## 2021-09-07 NOTE — Procedures (Signed)
Arterial Catheter Insertion Procedure Note  Bobby [name]  [id number]  11/04/1945  Date:09/07/21  Time:10:14 AM    Provider Performing: Esperanza Sheets T    Procedure: Insertion of Arterial Line [phone]) without US guidance  Indication(s) Blood pressure monitoring and/or need for frequent ABGs  Consent Unable to obtain consent due to emergent nature of procedure.  Anesthesia None   Time Out Verified patient identification, verified procedure, site/side was marked, verified correct patient position, special equipment/implants available, medications/allergies/relevant history reviewed, required imaging and test results available.   Sterile Technique Maximal sterile technique including full sterile barrier drape, hand hygiene, sterile gown, sterile gloves, mask, hair covering, sterile ultrasound probe cover (if used).   Procedure Description Area of catheter insertion was cleaned with chlorhexidine and draped in sterile fashion. Without real-time ultrasound guidance an arterial catheter was placed into the right radial artery.  Appropriate arterial tracings confirmed on monitor.     Complications/Tolerance None; patient tolerated the procedure well.   EBL Minimal   Specimen(s) None

## 2021-09-07 NOTE — ED Notes (Addendum)
Messaged/paged Dr. Lake Bells in regards to patient not bradycardic in the 40s and BP 80/53. Enquiring about switching sedation from propofol to fentanyl drip. Awaiting orders. Continuing to monitor patient.

## 2021-09-07 NOTE — ED Provider Notes (Signed)
[facility] EMERGENCY DEPARTMENT Provider Note   CSN: [id number] Arrival date & time: 09/07/21  0543     History  Chief Complaint  Patient presents with   Respiratory Distress    Bobby [name] is a 76 y.o. male.  HPI     This is a 76 year old male with history significant for coronary artery disease, pulmonary hypertension, diabetes, COPD, ongoing smoking who presents with respiratory distress.  Per EMS they were called out for shortness of breath.  Recent history of pneumonia diagnosed on 1/1.  Upon EMS arrival he was noted to have O2 sats in the 70s.  With a nonrebreather he did not improve significantly.  He was placed on CPAP but did not tolerate and O2 sats persistently dropped into the 60s.  He became more somnolent and was bagged.  With bagging, O2 sats came up into the low 90s.  EMS reports that he was awake and alert upon their arrival.  On my evaluation, he is unable to contribute to history taking.  Level 5 caveat for acuity of condition  Chart reviewed.  Seen 1/1 at [facility].  Diagnosed with pneumonia.  Reportedly normal O2 sats on room air.  Discharged with doxycycline.  He represented to the emergency room on 1/2 but was only seen by screening MSE provider.  At that time he continued to endorse shortness of breath.  I have reviewed chest x-ray independently from 1/2.  Showed bilateral pleural effusions and patchy airspace opacities.  Home Medications Prior to Admission medications   Medication Sig Start Date End Date Taking? Authorizing Provider  albuterol (PROVENTIL HFA;VENTOLIN HFA) 108 (90 Base) MCG/ACT inhaler Inhale 2 puffs into the lungs every 6 (six) hours as needed for wheezing or shortness of breath. 08/21/17   Bhagat, Crista Luria, PA  albuterol (PROVENTIL) (2.5 MG/3ML) 0.083% nebulizer solution Take 5 mg by nebulization every 4 (four) hours as needed. For wheezing or SOB    [provider]  amLODipine (NORVASC) 5 MG tablet TAKE ONE  TABLET BY MOUTH DAILY 07/04/21   Troy Sine, MD  apixaban (ELIQUIS) 5 MG TABS tablet Take 1 tablet (5 mg total) by mouth 2 (two) times daily. 02/19/19   Troy Sine, MD  aspirin EC 81 MG tablet Take 1 tablet (81 mg total) by mouth daily. Swallow whole. 11/09/20   Erlene Quan, PA-C  atorvastatin (LIPITOR) 80 MG tablet Take 1 tablet (80 mg total) by mouth daily at 6 PM. 03/11/18   Troy Sine, MD  benzonatate (TESSALON) 100 MG capsule Take 100-200 mg by mouth 3 (three) times daily as needed. 01/03/20   [provider]  BREZTRI AEROSPHERE 160-9-4.8 MCG/ACT AERO Inhale 2 puffs into the lungs 2 (two) times daily. 01/06/20   [provider]  buPROPion (WELLBUTRIN XL) 300 MG 24 hr tablet Take 300 mg by mouth daily.    [provider]  carvedilol (COREG) 25 MG tablet TAKE ONE TABLET BY MOUTH TWICE DAILY 07/04/21   Troy Sine, MD  clonazePAM (KLONOPIN) 0.5 MG tablet Take 1 mg by mouth 2 (two) times daily.  06/01/17   [provider]  Coenzyme Q10 (COQ-10) 100 MG CAPS Take 1 capsule by mouth daily.     [provider]  COLCRYS 0.6 MG tablet Take 0.6 mg by mouth daily. 03/21/17   [provider]  doxycycline (VIBRAMYCIN) 100 MG capsule Take 1 capsule (100 mg total) by mouth 2 (two) times daily. 09/03/21   Aletta Edouard  C, MD  hydrALAZINE (APRESOLINE) 10 MG tablet Take 1 tablet (10 mg total) by mouth 2 (two) times daily. 09/05/21   Troy Sine, MD  metFORMIN (GLUCOPHAGE-XR) 500 MG 24 hr tablet Take 500 mg by mouth daily. 03/21/17   [provider]  nitroGLYCERIN (NITROSTAT) 0.4 MG SL tablet DISSOLVE 1 TABLET UNDER THE TONGUE EVERY 5 MINUTES AS NEEDED FOR CHEST PAIN. DO NOT EXCEED A TOTAL OF 3 DOSES IN 15 MINUTES. 07/04/21   Troy Sine, MD  Omega-3 Fatty Acids (FISH OIL PO) Take 1 tablet by mouth daily.    [provider]  oxyCODONE-acetaminophen (PERCOCET) 10-325 MG per tablet Take 1 tablet by mouth 3 (three) times daily.     [provider]  predniSONE (DELTASONE) 10 MG tablet Take 10 mg by mouth daily. 11/05/20   [provider]  sertraline (ZOLOFT) 100 MG tablet Take 100 mg by mouth daily.    [provider]      Allergies    Hydrocodone    Review of Systems   Review of Systems  Unable to perform ROS: Mental status change   Physical Exam Updated Vital Signs BP 120/72    Pulse 78    Temp (!) 97.1 F (36.2 C) (Rectal)    Resp (!) 0    SpO2 100%  Physical Exam Vitals and nursing note reviewed.  Constitutional:      Appearance: He is well-developed. He is obese. He is ill-appearing, toxic-appearing and diaphoretic.     Comments: Ill-appearing, unresponsive, apneic breathing  HENT:     Head: Normocephalic and atraumatic.     Mouth/Throat:     Mouth: Mucous membranes are moist.  Eyes:     Pupils: Pupils are equal, round, and reactive to light.     Comments: Pupils 3 mm and reactive bilaterally  Cardiovascular:     Rate and Rhythm: Normal rate and regular rhythm.     Heart sounds: Normal heart sounds. No murmur heard. Pulmonary:     Effort: Respiratory distress present.     Breath sounds: Wheezing present.     Comments: Accessory muscle use, poor air movement, occasional wheeze Abdominal:     General: Bowel sounds are normal. There is distension.     Palpations: Abdomen is soft.     Tenderness: There is no abdominal tenderness. There is no rebound.  Musculoskeletal:     Cervical back: Neck supple.     Comments: Trace lower extremity edema  Lymphadenopathy:     Cervical: No cervical adenopathy.  Skin:    General: Skin is warm.  Neurological:     GCS: GCS eye subscore is 1. GCS verbal subscore is 2. GCS motor subscore is 5.     Comments: Obtunded  Psychiatric:     Comments: Unable to assess    ED Results / Procedures / Treatments   Labs (all labs ordered are listed, but only abnormal results are displayed) Labs Reviewed  CBC WITH DIFFERENTIAL/PLATELET - Abnormal;  Notable for the following components:      Result Value   WBC 15.2 (*)    Neutro Abs 9.7 (*)    Monocytes Absolute 1.3 (*)    Abs Immature Granulocytes 0.14 (*)    All other components within normal limits  I-STAT CHEM 8, ED - Abnormal; Notable for the following components:   BUN 45 (*)    Creatinine, Ser 1.90 (*)    Glucose, Bld 176 (*)    All other components within  normal limits  I-STAT ARTERIAL BLOOD GAS, ED - Abnormal; Notable for the following components:   pH, Arterial 7.119 (*)    pCO2 arterial 78.6 (*)    pO2, Arterial 345 (*)    Acid-base deficit 5.0 (*)    HCT 37.0 (*)    Hemoglobin 12.6 (*)    All other components within normal limits  RESP PANEL BY RT-PCR (FLU A&B, COVID) ARPGX2  URINE CULTURE  CULTURE, BLOOD (ROUTINE X 2)  CULTURE, BLOOD (ROUTINE X 2)  COMPREHENSIVE METABOLIC PANEL  LACTIC ACID, PLASMA  LACTIC ACID, PLASMA  BRAIN NATRIURETIC PEPTIDE  PROTIME-INR  APTT  URINALYSIS, ROUTINE W REFLEX MICROSCOPIC  HEMOGLOBIN A1C    EKG EKG Interpretation  Date/Time:  Thursday September 07 2021 05:47:45 EST Ventricular Rate:  81 PR Interval:  237 QRS Duration: 180 QT Interval:  431 QTC Calculation: 501 R Axis:   -78 Text Interpretation: Sinus rhythm Prolonged PR interval Right bundle branch block LVH with secondary repolarization abnormality Baseline wander in lead(s) V2 Partial missing lead(s): V2 Confirmed by Thayer Jew [phone]) on 09/07/2021 6:12:36 AM  Radiology DG Chest Portable 1 View  Result Date: 09/07/2021 CLINICAL DATA:  Ventilator dependence. EXAM: PORTABLE CHEST 1 VIEW COMPARISON:  09/04/2021 FINDINGS: 0602 hours. Endotracheal tube tip is approximately 5.4 cm above the base of the carina. The NG tube passes into the stomach although the distal tip position is not included on the film. The cardio pericardial silhouette is enlarged. There is pulmonary vascular congestion without overt pulmonary edema. Diffuse interstitial opacity suggests edema. No  substantial pleural effusion. Telemetry leads overlie the chest. IMPRESSION: Vascular congestion with diffuse interstitial opacity suggests edema. Lung bases show improved aeration compared to the prior study. Electronically Signed   By: Misty Stanley M.D.   On: 09/07/2021 06:16    Procedures .Critical Care Performed by: Merryl Hacker, MD Authorized by: Merryl Hacker, MD   Critical care provider statement:    Critical care time (minutes):  45   Critical care was necessary to treat or prevent imminent or life-threatening deterioration of the following conditions:  Respiratory failure   Critical care was time spent personally by me on the following activities:  Development of treatment plan with patient or surrogate, discussions with consultants, evaluation of patient's response to treatment, examination of patient, ordering and review of laboratory studies, ordering and review of radiographic studies, ordering and performing treatments and interventions, pulse oximetry, re-evaluation of patient's condition and review of old charts Procedure Name: Intubation Date/Time: 09/07/2021 6:08 AM Performed by: Merryl Hacker, MD Pre-anesthesia Checklist: Patient identified, Patient being monitored, Emergency Drugs available, Timeout performed and Suction available Oxygen Delivery Method: Non-rebreather mask Preoxygenation: Pre-oxygenation with 100% oxygen Induction Type: Rapid sequence Ventilation: Mask ventilation without difficulty Laryngoscope Size: Glidescope and 4 Grade View: Grade I Tube size: 7.5 mm Number of attempts: 1 Placement Confirmation: ETT inserted through vocal cords under direct vision, CO2 detector and Breath sounds checked- equal and bilateral Secured at: 26 cm Tube secured with: ETT holder Dental Injury: Teeth and Oropharynx as per pre-operative assessment  Comments: Transient hypoxia post intubation with recovery       Medications Ordered in ED Medications   lactated ringers infusion ( Intravenous New Bag/Given 09/07/21 0633)  ceFEPIme (MAXIPIME) 2 g in sodium chloride 0.9 % 100 mL IVPB (2 g Intravenous New Bag/Given 09/07/21 0634)  vancomycin (VANCOREADY) IVPB 2000 mg/400 mL (has no administration in time range)  propofol (DIPRIVAN) 1000 MG/100ML infusion (has no administration  in time range)  heparin injection 5,000 Units (has no administration in time range)  pantoprazole (PROTONIX) injection 40 mg (has no administration in time range)  lactated ringers infusion (has no administration in time range)  polyethylene glycol (MIRALAX / GLYCOLAX) packet 17 g (has no administration in time range)  albuterol (PROVENTIL) (2.5 MG/3ML) 0.083% nebulizer solution 2.5 mg (has no administration in time range)  aspirin chewable tablet 81 mg (has no administration in time range)  insulin aspart (novoLOG) injection 0-15 Units (has no administration in time range)  docusate (COLACE) 50 MG/5ML liquid 100 mg (has no administration in time range)    ED Course/ Medical Decision Making/ A&P                           Medical Decision Making  This patient presents to the ED for concern of respiratory distress, this involves an extensive number of treatment options, and is a complaint that carries with it a high risk of complications and morbidity.  The differential diagnosis includes hypercarbic/hypoxic respiratory failure, pneumonia, COVID-19, other infectious process, CHF, ACS  MDM:    This is a 76 year old male with multiple medical comorbidities who presents in respiratory distress.  On my evaluation he is obtunded.  Highly suspect prolonged hypoxia versus hypercarbia.  Recent respiratory illness and shortness of breath with pneumonia.  Patient was intubated for airway control given his GCS of 8.  Chest x-ray, basic labs, COVID testing, EKG all obtained.  EKG without acute ischemic changes and relatively unchanged from prior.  Given recent concerns for pneumonia and  elevated white count of 15.  Sepsis alert was initiated.  He was given broad-spectrum antibiotics of cefepime and vancomycin given concern for pulmonary source.  He is not hypotensive.  Will await lactate for aggressive fluid resuscitation as he appears volume overloaded.  (Labs, imaging)  Labs: I Ordered, and personally interpreted labs.  The pertinent results include: Lab results pending 6:36 AM White count with left shift ABG with what appears to be respiratory acidosis.  pH 7.108, PCO2 81.5, O2 350  Imaging Studies ordered: I ordered imaging studies including chest x-ray I independently visualized and interpreted imaging.  Worsening airspace opacities right greater than left question edema I agree with the radiologist interpretation  Critical Interventions: Intubation   Consultations Obtained: I requested consultation with the critical care, Dr. Valeta Harms,  and discussed lab and imaging findings as well as pertinent plan - they recommend: admisson  Reevaluation: After the interventions noted above, I reevaluated the patient and found that they have : He is stable  Social Determinants of Health: n/a  Disposition:  admission  Co morbidities that complicate the patient evaluation  Past Medical History:  Diagnosis Date   CAD (coronary artery disease),residual non obstructive disease    a. 08/2012 Inferior STEMI: RCA 99% (3.5x32 Promus DES), nonobs LAD/LCX dzs;  b. 01/2013 MV: EF 66%, small inferoseptal artifact and possible small inferobasilar scar;  c. 03/2014 MV: fixed inf defect->diaph attenuation, no ischemia. 12/18 PCI/DES to mLAD, staged PCI/DES Lcx/OM   DM (diabetes mellitus), type 2 new diagnosis 08/05/2012   diet control   ED (erectile dysfunction)    surgery planned   History of echocardiogram    a. 08/2012 Echo: EF 55-60%, possible mild basal inferior HK, mildly dil RA, PASP 30mmHg.   Hyperlipidemia 08/05/2012   Hypertensive heart disease    MI (myocardial infarction)  ([place]) 08/04/2012   acute inferior stemi secondary to  RCA occlusion; PCI   Panic attack    Pulmonary HTN ([place])    echo 08/05/12, EF 55-60%, PA pressure 10mm   S/P coronary artery stent placement, to RCA Promus DES 08/05/2012   Stroke [facility])    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   Tobacco abuse       Additional history obtained from chart review External records from outside source obtained and reviewed including prior ED visits   Cardiac Monitoring: The patient was maintained on a cardiac monitor.  I personally viewed and interpreted the cardiac monitored which showed an underlying rhythm of: NSR   Medicines  Meds ordered this encounter  Medications   lactated ringers infusion   DISCONTD: vancomycin (VANCOCIN) IVPB 1000 mg/200 mL premix    Order Specific Question:   Indication:    Answer:   Pneumonia   ceFEPIme (MAXIPIME) 2 g in sodium chloride 0.9 % 100 mL IVPB    Order Specific Question:   Antibiotic Indication:    Answer:   HCAP   vancomycin (VANCOREADY) IVPB 2000 mg/400 mL    Order Specific Question:   Indication:    Answer:   Pneumonia   propofol (DIPRIVAN) 1000 MG/100ML infusion   heparin injection 5,000 Units   pantoprazole (PROTONIX) injection 40 mg   lactated ringers infusion   DISCONTD: docusate sodium (COLACE) capsule 100 mg   polyethylene glycol (MIRALAX / GLYCOLAX) packet 17 g   albuterol (PROVENTIL) (2.5 MG/3ML) 0.083% nebulizer solution 2.5 mg   aspirin chewable tablet 81 mg   insulin aspart (novoLOG) injection 0-15 Units    Order Specific Question:   Correction coverage:    Answer:   Moderate (average weight, post-op)    Order Specific Question:   CBG < 70:    Answer:   Implement Hypoglycemia Standing Orders and refer to Hypoglycemia Standing Orders sidebar report    Order Specific Question:   CBG 70 - 120:    Answer:   0 units    Order Specific Question:   CBG 121 - 150:    Answer:   2 units    Order Specific Question:   CBG 151 - 200:    Answer:   3 units     Order Specific Question:   CBG 201 - 250:    Answer:   5 units    Order Specific Question:   CBG 251 - 300:    Answer:   8 units    Order Specific Question:   CBG 301 - 350:    Answer:   11 units    Order Specific Question:   CBG 351 - 400:    Answer:   15 units    Order Specific Question:   CBG > 400    Answer:   call MD and obtain STAT lab verification   docusate (COLACE) 50 MG/5ML liquid 100 mg     I have reviewed the patients home medicines and have made adjustments as needed   Problem List / ED Course: Problem List Items Addressed This Visit   None Visit Diagnoses     Acute on chronic respiratory failure with hypoxia and hypercapnia (HCC)    -  Primary   Sepsis, due to unspecified organism, unspecified whether acute organ dysfunction present [facility])                       Final Clinical Impression(s) / ED Diagnoses Final diagnoses:  Acute on chronic respiratory failure with hypoxia and hypercapnia ([place])  Sepsis, due to unspecified organism, unspecified whether acute organ dysfunction present [facility])    Rx / DC Orders ED Discharge Orders     None         Massai Hankerson, Barbette Hair, MD 09/07/21 [phone]

## 2021-09-07 NOTE — Progress Notes (Signed)
ANTICOAGULATION CONSULT NOTE - Initial Consult  Pharmacy Consult for heparin Indication: h/o possible LV thrombus and h/o TIA  Allergies  Allergen Reactions   Hydrocodone Itching    Patient Measurements: Height: 5\' 9"  (175.3 cm) Weight: 117.3 kg (258 lb 9.6 oz) IBW/kg (Calculated) : 70.7 Heparin Dosing Weight: 100kg  Vital Signs: Temp: 97.1 F (36.2 C) (01/05 0611) Temp Source: Rectal (01/05 0611) BP: 120/72 (01/05 0630) Pulse Rate: 78 (01/05 0630)  Labs: Recent Labs    09/04/21 1843 09/07/21 0600 09/07/21 0604 09/07/21 0629  HGB 13.0 13.8 15.0 12.6*  HCT 42.3 45.6 44.0 37.0*  PLT 183 244  --   --   CREATININE 1.87* 2.27* 1.90*  --     Estimated Creatinine Clearance: 42.4 mL/min (A) (by C-G formula based on SCr of 1.9 mg/dL (H)).   Medical History: Past Medical History:  Diagnosis Date   CAD (coronary artery disease),residual non obstructive disease    a. 08/2012 Inferior STEMI: RCA 99% (3.5x32 Promus DES), nonobs LAD/LCX dzs;  b. 01/2013 MV: EF 66%, small inferoseptal artifact and possible small inferobasilar scar;  c. 03/2014 MV: fixed inf defect->diaph attenuation, no ischemia. 12/18 PCI/DES to mLAD, staged PCI/DES Lcx/OM   DM (diabetes mellitus), type 2 new diagnosis 08/05/2012   diet control   ED (erectile dysfunction)    surgery planned   History of echocardiogram    a. 08/2012 Echo: EF 55-60%, possible mild basal inferior HK, mildly dil RA, PASP 21mmHg.   Hyperlipidemia 08/05/2012   Hypertensive heart disease    MI (myocardial infarction) ([place]) 08/04/2012   acute inferior stemi secondary to RCA occlusion; PCI   Panic attack    Pulmonary HTN ([place])    echo 08/05/12, EF 55-60%, PA pressure 31mm   S/P coronary artery stent placement, to RCA Promus DES 08/05/2012   Stroke ([place])    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   Tobacco abuse     Assessment: 76yo male admitted for acute on chronic respiratory failure, to transition from Eliquis to UFH; last dose of Eliquis  unclear.  Goal of Therapy:  Heparin level 0.3-0.7 units/ml aPTT 66-102 seconds Monitor platelets by anticoagulation protocol: Yes   Plan:  Start heparin infusion at 1500 units/hr and monitor heparin levels, aPTT (while Eliquis affects anti-Xa lab), and CBC.  [name], PharmD, BCPS  09/07/2021,7:02 AM

## 2021-09-07 NOTE — ED Notes (Signed)
Levophed started at 5 mcg per Dr. Lake Bells.

## 2021-09-07 NOTE — ED Triage Notes (Signed)
Pt arrived by EMS for resp distress. Pt alert on EMS arrival, 73% on 3L home oxygen, rales, alert, cool and clammy. Given nitro x1, placed on CPAP, sats at 61%. Pt became less responsive, assisted ventilations, sats improved to 90%.

## 2021-09-07 NOTE — Progress Notes (Signed)
Pharmacy Antibiotic Note  Bobby [name] is a 76 y.o. male admitted on 09/07/2021 with pneumonia.  Pharmacy has been consulted for vancomycin and cefepime dosing.  Pt w/ AKI, SCr 1.64>1.87>2.27.  Plan: Vancomycin 2000mg  IV x1; monitor SCr +/- vanc levels prior to redosing. Cefepime 2g IV Q12H.  Temp (24hrs), Avg:96.4 F (35.8 C), Min:95.6 F (35.3 C), Max:97.1 F (36.2 C)  Recent Labs  Lab 09/03/21 0959 09/04/21 1843 09/07/21 0600 09/07/21 0604  WBC 9.6 9.1 15.2*  --   CREATININE 1.64* 1.87* 2.27* 1.90*  LATICACIDVEN  --   --  1.6  --     Estimated Creatinine Clearance: 42.4 mL/min (A) (by C-G formula based on SCr of 1.9 mg/dL (H)).    Allergies  Allergen Reactions   Hydrocodone Itching    Thank you for allowing pharmacy to be a part of this patients care.  [name], PharmD, BCPS  09/07/2021 6:47 AM

## 2021-09-07 NOTE — Progress Notes (Signed)
ANTICOAGULATION CONSULT NOTE - Initial Consult  Pharmacy Consult for heparin Indication: h/o possible LV thrombus and h/o TIA  Allergies  Allergen Reactions   Hydrocodone Itching    Patient Measurements: Height: 5\' 9"  (175.3 cm) Weight: 117.3 kg (258 lb 9.6 oz) IBW/kg (Calculated) : 70.7 Heparin Dosing Weight: 100kg  Vital Signs: Temp: 97.1 F (36.2 C) (01/05 0611) Temp Source: Rectal (01/05 0611) BP: 118/65 (01/05 0815) Pulse Rate: 50 (01/05 0843)  Labs: Recent Labs    09/04/21 1843 09/07/21 0600 09/07/21 0604 09/07/21 0629  HGB 13.0 13.8 15.0 12.6*  HCT 42.3 45.6 44.0 37.0*  PLT 183 244  --   --   APTT  --  31  --   --   LABPROT  --  14.5  --   --   INR  --  1.1  --   --   CREATININE 1.87* 2.27* 1.90*  --     Estimated Creatinine Clearance: 42.4 mL/min (A) (by C-G formula based on SCr of 1.9 mg/dL (H)).   Medical History: Past Medical History:  Diagnosis Date   CAD (coronary artery disease),residual non obstructive disease    a. 08/2012 Inferior STEMI: RCA 99% (3.5x32 Promus DES), nonobs LAD/LCX dzs;  b. 01/2013 MV: EF 66%, small inferoseptal artifact and possible small inferobasilar scar;  c. 03/2014 MV: fixed inf defect->diaph attenuation, no ischemia. 12/18 PCI/DES to mLAD, staged PCI/DES Lcx/OM   DM (diabetes mellitus), type 2 new diagnosis 08/05/2012   diet control   ED (erectile dysfunction)    surgery planned   History of echocardiogram    a. 08/2012 Echo: EF 55-60%, possible mild basal inferior HK, mildly dil RA, PASP 68mmHg.   Hyperlipidemia 08/05/2012   Hypertensive heart disease    MI (myocardial infarction) ([place]) 08/04/2012   acute inferior stemi secondary to RCA occlusion; PCI   Panic attack    Pulmonary HTN ([place])    echo 08/05/12, EF 55-60%, PA pressure 9mm   S/P coronary artery stent placement, to RCA Promus DES 08/05/2012   Stroke [facility])    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   Tobacco abuse     Assessment: 76yo male admitted for acute on chronic  respiratory failure, with history of apical thrombus on Eliquis PTA per notes. Last dose of Eliquis on 09/06/21 at 1700 PM.  Patient was initiated on IV Heparin this AM. Pharmacy now consulted to change therapy back to Eliquis.   Patient is intubated with OG in place.  SCr trend down at 1.90.  H/H 08/08/36. Platelets within normal limits.  Patient is currently bradycardic and hypotensive on Propofol - RN messaging MD for change in sedation orders.  No bleeding noted.   Goal of Therapy:  Heparin level 0.3-0.7 units/ml aPTT 66-102 seconds Monitor platelets by anticoagulation protocol: Yes   Plan:  Resume Apixaban 5 po BID Stop IV Heparin at same time 1st dose of Apixaban given.  Continue to monitor CBC, renal function, and clinical status  Sloan Leiter, PharmD, BCPS, BCCCP Clinical Pharmacist Please refer to [facility] for [place] numbers 09/07/2021,8:57 AM

## 2021-09-07 NOTE — ED Notes (Signed)
Dr. Lake Bells placing central line at this time.

## 2021-09-07 NOTE — Consult Note (Signed)
Cardiology Consultation:   Patient ID: MAURISIO RUDDY MRN: [id number]; DOB: [birth year]  Admit date: 09/07/2021 Date of Consult: 09/07/2021  PCP:  Hayden Rasmussen, MD   [facility] Providers Cardiologist:  Shelva Majestic, MD        Patient Profile:   YUSUKE BEZA is a 76 y.o. male with a hx of HTN, HLD, DM II, pulmonary HTN, remote small TIA/CVA and CAD who is being seen 09/07/2021 for the evaluation of bradycardia and acute respiratory failure at the request of McQuaid.  History of Present Illness:   Mr. Tomeo is a 76 year old male with past medical history of HTN, HLD, DM II, pulmonary HTN, remote small TIA/CVA and CAD.  Patient had an inferior MI in December 2013 and underwent DES to RCA.  Echocardiogram at the time showed EF 50 to 55%.  Unfortunately he had anterior STEMI in December 2019 due to occlusion of mid LAD at the second diagonal, this was treated with drug-eluting stent.  He also noted to have 99% dominant mid left circumflex lesion with 80% stenosis in the large OM1.  He returned to the Cath Lab on 08/12/2017 and underwent staged PCI of mid left circumflex and OM1 with 2 separate stents.  Troponin was greater than 65.  Echocardiogram during admission showed EF 30 to 35%, new large aneurysm involving the mid anteroseptal, anterior and apical region consistent with infarct in the LAD territory, grade 1 DD.  He has been followed by pulmonology service due to COPD.  Echocardiogram obtained in May 2019 showed EF 35 to 40%, grade 1 DD, akinesis of the mid apical anteroseptal and apical myocardium, there was evidence of swirling at the LV apex suggestive of low flow state.  He was initially started on Coumadin but later transitioned to Eliquis.  Most recent echocardiogram obtained on 07/02/2021 showed EF 30 to 35%, no regional wall motion abnormality, moderate LAE, mild MR.   More recently, patient presented to the emergency room on 09/03/2021 with 4-day onset of productive cough with white  sputum.  Chest x-ray at the time showed a mild opacity of the medial right lung base suspicious for pneumonia.  He was treated with 20 mg of IV Lasix and a DuoNeb.  He was eventually discharged on doxycycline.  Creatinine on arrival was 1.64.  BNP 786.2.  Prior to the ED visit, his losartan and spironolactone were held due to AKI when his creatinine peaked at greater than 2.3.  Renal function seems to be improving by the time he went to the emergency room.  He was seen by Dr. Claiborne Billings at the office on 09/05/2021.  He called EMS in the morning of 09/07/2021 due to worsening respiratory distress.  Upon EMS arrival, his O2 saturation was in the 70s.  He did not improve with nonrebreather and was placed on CPAP however did not tolerate the CPAP machine either.  O2 saturation subsequently dropped into the 60s and he became somnolent.  On ED arrival, he was quickly intubated due to acute respiratory failure and altered mental status.  BNP was 898.3.  Lactic acid 1.6.  Planning was 2.27 on arrival.  Flu and COVID panel negative.  He has been treated with cefepime and vancomycin for possible pneumonia.  He was subsequently started on propofol.  On arrival, he was still hypertensive with blood pressure in the 150-160s.  He was also tachycardic with heart rate in the 90s to low 110s.  About an hour after he arrived, his blood pressure and  heart rate both began to drop.  Heart rate dropped down to the mid 40s before bouncing back to the low 50s.  Telemetry shows sinus bradycardia with nonconducted PACs, no significant junctional rhythm or third-degree heart block.  Initial arterial ABG showed pH 7.119, PCO2 78.6, PaO2 345.  Hemoglobin was 12.6.  White blood cell count elevated 15.2.  Chest x-ray showed vascular congestion with diffuse interstitial opacity.  Unfortunately known history was obtained from the patient due to intubation and sedation.  On examination, he does not have any lower extremity edema.   Past Medical History:   Diagnosis Date   CAD (coronary artery disease),residual non obstructive disease    a. 08/2012 Inferior STEMI: RCA 99% (3.5x32 Promus DES), nonobs LAD/LCX dzs;  b. 01/2013 MV: EF 66%, small inferoseptal artifact and possible small inferobasilar scar;  c. 03/2014 MV: fixed inf defect->diaph attenuation, no ischemia. 12/18 PCI/DES to mLAD, staged PCI/DES Lcx/OM   DM (diabetes mellitus), type 2 new diagnosis 08/05/2012   diet control   ED (erectile dysfunction)    surgery planned   History of echocardiogram    a. 08/2012 Echo: EF 55-60%, possible mild basal inferior HK, mildly dil RA, PASP 50mmHg.   Hyperlipidemia 08/05/2012   Hypertensive heart disease    MI (myocardial infarction) ([place]) 08/04/2012   acute inferior stemi secondary to RCA occlusion; PCI   Panic attack    Pulmonary HTN ([place])    echo 08/05/12, EF 55-60%, PA pressure 42mm   S/P coronary artery stent placement, to RCA Promus DES 08/05/2012   Stroke [facility])    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   Tobacco abuse     Past Surgical History:  Procedure Laterality Date   CARDIAC CATHETERIZATION  08/04/12   PCI to RCA with DES   CORONARY STENT INTERVENTION N/A 08/12/2017   Procedure: CORONARY STENT INTERVENTION;  Surgeon: Martinique, Peter M, MD;  Location: [place] CV LAB;  Service: Cardiovascular;  Laterality: N/A;   CORONARY/GRAFT ACUTE MI REVASCULARIZATION N/A 08/09/2017   Procedure: Coronary/Graft Acute MI Revascularization;  Surgeon: Belva Crome, MD;  Location: [place] CV LAB;  Service: Cardiovascular;  Laterality: N/A;   LEFT HEART CATH AND CORONARY ANGIOGRAPHY N/A 08/09/2017   Procedure: LEFT HEART CATH AND CORONARY ANGIOGRAPHY;  Surgeon: Belva Crome, MD;  Location: [place] CV LAB;  Service: Cardiovascular;  Laterality: N/A;   LEFT HEART CATHETERIZATION WITH CORONARY ANGIOGRAM N/A 08/04/2012   Procedure: LEFT HEART CATHETERIZATION WITH CORONARY ANGIOGRAM;  Surgeon: Troy Sine, MD;  Location: [facility] CATH LAB;  Service:  Cardiovascular;  Laterality: N/A;   LEG SURGERY Left    rod placed for fracture repair   PENILE PROSTHESIS IMPLANT N/A 06/03/2014   Procedure: IMPLANT PENILE PROTHESIS INFLATABLE;  Surgeon: Ailene Rud, MD;  Location: [facility] ORS;  Service: Urology;  Laterality: N/A;  with penile block--0.5% marcaine plain   PERCUTANEOUS CORONARY STENT INTERVENTION (PCI-S) Right 08/04/2012   Procedure: PERCUTANEOUS CORONARY STENT INTERVENTION (PCI-S);  Surgeon: Troy Sine, MD;  Location: [facility] CATH LAB;  Service: Cardiovascular;  Laterality: Right;   THUMB ARTHROSCOPY Left    thumb joint replaced     Home Medications:  Prior to Admission medications   Medication Sig Start Date End Date Taking? Authorizing Provider  albuterol (PROVENTIL HFA;VENTOLIN HFA) 108 (90 Base) MCG/ACT inhaler Inhale 2 puffs into the lungs every 6 (six) hours as needed for wheezing or shortness of breath. 08/21/17  Yes Bhagat, Bhavinkumar, PA  albuterol (PROVENTIL) (2.5 MG/3ML) 0.083% nebulizer solution Take  5 mg by nebulization every 4 (four) hours as needed. For wheezing or SOB   Yes [provider]  amLODipine (NORVASC) 5 MG tablet TAKE ONE TABLET BY MOUTH DAILY Patient taking differently: Take 5 mg by mouth daily. 07/04/21  Yes Troy Sine, MD  apixaban (ELIQUIS) 5 MG TABS tablet Take 1 tablet (5 mg total) by mouth 2 (two) times daily. 02/19/19  Yes Troy Sine, MD  aspirin EC 81 MG tablet Take 1 tablet (81 mg total) by mouth daily. Swallow whole. Patient taking differently: Take 81 mg by mouth at bedtime. Swallow whole. 11/09/20  Yes Kilroy, Luke K, PA-C  atorvastatin (LIPITOR) 80 MG tablet Take 1 tablet (80 mg total) by mouth daily at 6 PM. 03/11/18  Yes Troy Sine, MD  benzonatate (TESSALON) 100 MG capsule Take 100-200 mg by mouth 3 (three) times daily as needed for cough. 01/03/20  Yes [provider]  BREZTRI AEROSPHERE 160-9-4.8 MCG/ACT AERO Inhale 2 puffs into the lungs 2 (two) times daily. 01/06/20   Yes [provider]  buPROPion (WELLBUTRIN XL) 300 MG 24 hr tablet Take 300 mg by mouth daily.   Yes [provider]  carvedilol (COREG) 25 MG tablet TAKE ONE TABLET BY MOUTH TWICE DAILY Patient taking differently: Take 25 mg by mouth 2 (two) times daily with a meal. 07/04/21  Yes Troy Sine, MD  Coenzyme Q10 (COQ-10) 100 MG CAPS Take 100 mg by mouth daily.   Yes [provider]  COLCRYS 0.6 MG tablet Take 0.6 mg by mouth daily. 03/21/17  Yes [provider]  doxycycline (VIBRAMYCIN) 100 MG capsule Take 1 capsule (100 mg total) by mouth 2 (two) times daily. 09/03/21  Yes Hayden Rasmussen, MD  Fexofenadine HCl [facility] ALLERGY PO) Take 1 tablet by mouth every 6 (six) hours as needed (cough).   Yes [provider]  hydrALAZINE (APRESOLINE) 10 MG tablet Take 1 tablet (10 mg total) by mouth 2 (two) times daily. 09/05/21  Yes Troy Sine, MD  metFORMIN (GLUCOPHAGE-XR) 500 MG 24 hr tablet Take 500 mg by mouth daily. 03/21/17  Yes [provider]  nitroGLYCERIN (NITROSTAT) 0.4 MG SL tablet DISSOLVE 1 TABLET UNDER THE TONGUE EVERY 5 MINUTES AS NEEDED FOR CHEST PAIN. DO NOT EXCEED A TOTAL OF 3 DOSES IN 15 MINUTES. Patient taking differently: Place 0.4 mg under the tongue every 5 (five) minutes as needed. 07/04/21  Yes Troy Sine, MD  Omega-3 Fatty Acids (FISH OIL PO) Take 1 tablet by mouth daily.   Yes [provider]  oxyCODONE-acetaminophen (PERCOCET) 10-325 MG per tablet Take 1 tablet by mouth every 6 (six) hours as needed for pain.   Yes [provider]  predniSONE (DELTASONE) 10 MG tablet Take 10 mg by mouth daily. 11/05/20  Yes [provider]  sertraline (ZOLOFT) 100 MG tablet Take 100 mg by mouth daily.   Yes [provider]    Inpatient Medications: Scheduled Meds:  apixaban  5 mg Oral BID   arformoterol  15 mcg Nebulization BID   aspirin  81 mg Per Tube Daily   budesonide (PULMICORT) nebulizer  solution  0.5 mg Nebulization BID   carvedilol  12.5 mg Per Tube BID WC   docusate  100 mg Per Tube BID   feeding supplement (PROSource TF)  45 mL Per Tube BID   feeding supplement (VITAL HIGH PROTEIN)  1,000 mL Per Tube Q24H   furosemide  40 mg Intravenous Q6H  hydrALAZINE  10 mg Per Tube Q8H   insulin aspart  0-15 Units Subcutaneous Q4H   pantoprazole (PROTONIX) IV  40 mg Intravenous QHS   polyethylene glycol  17 g Per Tube Daily   potassium chloride  40 mEq Per Tube Once   revefenacin  175 mcg Nebulization Daily   vancomycin variable dose per unstable renal function (pharmacist dosing)   Does not apply See admin instructions   Continuous Infusions:  sodium chloride     ceFEPime (MAXIPIME) IV     fentaNYL infusion INTRAVENOUS 25 mcg/hr (09/07/21 0921)   norepinephrine (LEVOPHED) Adult infusion 10 mcg/min (09/07/21 0933)   propofol (DIPRIVAN) infusion Stopped (09/07/21 0908)   PRN Meds: albuterol, docusate, fentaNYL (SUBLIMAZE) injection, fentaNYL (SUBLIMAZE) injection, polyethylene glycol  Allergies:    Allergies  Allergen Reactions   Hydrocodone Itching    Social History:   Social History   Socioeconomic History   Marital status: Legally Separated    Spouse name: Not on file   Number of children: Not on file   Years of education: Not on file   Highest education level: Not on file  Occupational History   Not on file  Tobacco Use   Smoking status: Every Day    Packs/day: 0.75    Years: 60.00    Pack years: 45.00    Types: Cigarettes   Smokeless tobacco: Never   Tobacco comments:    Currently smoking about .5ppd as of 06/14/21 ep  Substance and Sexual Activity   Alcohol use: Not Currently    Comment: ocasionally   Drug use: No   Sexual activity: Not on file  Other Topics Concern   Not on file  Social History Narrative   Not on file   Social Determinants of Health   Financial Resource Strain: Not on file  Food Insecurity: Not on file  Transportation  Needs: Not on file  Physical Activity: Not on file  Stress: Not on file  Social Connections: Not on file  Intimate Partner Violence: Not on file    Family History:    Family History  Problem Relation Age of Onset   Kidney disease Father        kidney disease and heart disease   Heart disease Maternal Grandmother    Stomach cancer Maternal Grandfather    Heart attack Paternal Grandmother      ROS:  Please see the history of present illness.   All other ROS reviewed and negative.     Physical Exam/Data:   Vitals:   09/07/21 0900 09/07/21 0915 09/07/21 0930 09/07/21 0945  BP: (!) 82/53 (!) 89/58 (!) 89/54 (!) 149/91  Pulse: (!) 43 (!) 44 (!) 44 (!) 128  Resp: 20 20 18  (!) 23  Temp:      TempSrc:      SpO2: 99% 99% 100% (!) 87%  Weight:      Height:        Intake/Output Summary (Last 24 hours) at 09/07/2021 1002 Last data filed at 09/07/2021 0807 Gross per 24 hour  Intake 1.06 ml  Output --  Net 1.06 ml   Last 3 Weights 09/07/2021 09/05/2021 09/03/2021  Weight (lbs) 258 lb 9.6 oz 258 lb 9.6 oz 257 lb  Weight (kg) 117.3 kg 117.3 kg 116.574 kg     Body mass index is 38.19 kg/m.  General: Intubated and sedated. HEENT: normal Neck: no JVD Vascular: No carotid bruits; Distal pulses 2+ bilaterally Cardiac:  normal S1, S2; RRR; no murmur  Lungs: Intubated, anterior examination shows breath sounds bilaterally without obvious rhonchi or wheezing. Abd: soft, no hepatomegaly  Ext: no edema Musculoskeletal:  No deformities, BUE and BLE strength normal and equal Skin: warm and dry  Neuro: Unable to assess as the patient is intubated and sedated Psych: Intubated and sedated  EKG:  The EKG was personally reviewed and demonstrates: Sinus bradycardia, right bundle branch block. Telemetry:  Telemetry was personally reviewed and demonstrates: Patient was initially in sinus tachycardia, however had worsening bradycardia after 7 AM with heart rate dropping down to the mid 40s before  bouncing back to the low 50s range.  Relevant CV Studies:  Echo 06/05/2021 IMPRESSIONS     1. Septal and apical akinesis inferior wall hypokinesis overall EF  similar to TTE 02/16/20. Left ventricular ejection fraction, by estimation,  is 30 to 35%. The left ventricle has moderately decreased function. The  left ventricle has no regional wall  motion abnormalities. Left ventricular diastolic parameters are consistent  with Grade I diastolic dysfunction (impaired relaxation).   2. Right ventricular systolic function is normal. The right ventricular  size is normal.   3. Left atrial size was moderately dilated.   4. Right atrial size was mildly dilated.   5. The mitral valve is abnormal. Mild mitral valve regurgitation. No  evidence of mitral stenosis.   6. The aortic valve is tricuspid. Aortic valve regurgitation is not  visualized. Mild to moderate aortic valve sclerosis/calcification is  present, without any evidence of aortic stenosis.   7. The inferior vena cava is normal in size with greater than 50%  respiratory variability, suggesting right atrial pressure of 3 mmHg.   Laboratory Data:  High Sensitivity Troponin:   Recent Labs  Lab 09/03/21 0959 09/03/21 1154  TROPONINIHS 15 16     Chemistry Recent Labs  Lab 09/03/21 0959 09/04/21 1843 09/07/21 0600 09/07/21 0604 09/07/21 0629 09/07/21 0939  NA 138 139 142 142 141 140  K 3.8 5.0 4.3 4.3 4.3 4.3  CL 107 109 108 110  --   --   CO2 22 23 25   --   --   --   GLUCOSE 110* 137* 180* 176*  --   --   BUN 28* 28* 44* 45*  --   --   CREATININE 1.64* 1.87* 2.27* 1.90*  --   --   CALCIUM 9.7 9.5 9.3  --   --   --   MG  --   --  1.9  --   --   --   GFRNONAA 43* 37* 29*  --   --   --   ANIONGAP 9 7 9   --   --   --     Recent Labs  Lab 09/04/21 1843 09/07/21 0600  PROT 6.4* 7.0  ALBUMIN 3.7 4.0  AST 20 18  ALT 21 20  ALKPHOS 99 130*  BILITOT 0.9 0.5   Lipids No results for input(s): CHOL, TRIG, HDL, LABVLDL,  LDLCALC, CHOLHDL in the last 168 hours.  Hematology Recent Labs  Lab 09/03/21 0959 09/04/21 1843 09/07/21 0600 09/07/21 0604 09/07/21 0629 09/07/21 0939  WBC 9.6 9.1 15.2*  --   --   --   RBC 4.55 4.49 4.71  --   --   --   HGB 13.3 13.0 13.8 15.0 12.6* 11.2*  HCT 40.8 42.3 45.6 44.0 37.0* 33.0*  MCV 89.7 94.2 96.8  --   --   --   MCH 29.2 29.0 29.3  --   --   --  MCHC 32.6 30.7 30.3  --   --   --   RDW 14.2 13.9 14.1  --   --   --   PLT 172 183 244  --   --   --    Thyroid No results for input(s): TSH, FREET4 in the last 168 hours.  BNP Recent Labs  Lab 09/03/21 0959 09/04/21 1843 09/07/21 0600  BNP 786.2* 612.8* 898.3*    DDimer No results for input(s): DDIMER in the last 168 hours.   Radiology/Studies:  DG Chest 1 View  Result Date: 09/04/2021 CLINICAL DATA:  History of COPD.  Cough and shortness of breath. EXAM: CHEST  1 VIEW.  Patient is rotated COMPARISON:  Chest x-ray 09/03/2021, CT chest 08/23/2021 FINDINGS: The heart and mediastinal contours are unchanged. Aortic calcification. Bibasilar patchy airspace opacities. Bilateral trace pleural effusion. No pulmonary edema. No pleural effusion. No pneumothorax. No acute osseous abnormality. IMPRESSION: Bilateral trace pleural effusions and bilateral lower lung zone patchy airspace opacities. Electronically Signed   By: Iven Finn M.D.   On: 09/04/2021 20:06   DG Chest Portable 1 View  Result Date: 09/07/2021 CLINICAL DATA:  Ventilator dependence. EXAM: PORTABLE CHEST 1 VIEW COMPARISON:  09/04/2021 FINDINGS: 0602 hours. Endotracheal tube tip is approximately 5.4 cm above the base of the carina. The NG tube passes into the stomach although the distal tip position is not included on the film. The cardio pericardial silhouette is enlarged. There is pulmonary vascular congestion without overt pulmonary edema. Diffuse interstitial opacity suggests edema. No substantial pleural effusion. Telemetry leads overlie the chest.  IMPRESSION: Vascular congestion with diffuse interstitial opacity suggests edema. Lung bases show improved aeration compared to the prior study. Electronically Signed   By: Misty Stanley M.D.   On: 09/07/2021 06:16   DG Chest Port 1 View  Result Date: 09/03/2021 CLINICAL DATA:  Shortness of breath and cough for 2 days. EXAM: PORTABLE CHEST 1 VIEW COMPARISON:  November 05, 2019 FINDINGS: The heart size and mediastinal contours are within normal limits. There is mild opacity of the medial right lung base. There is no pleural effusion or pulmonary edema. The visualized skeletal structures are stable. IMPRESSION: Mild opacity of the medial right lung base, suspicious for pneumonia. Electronically Signed   By: Abelardo Diesel M.D.   On: 09/03/2021 10:24     Assessment and Plan:   Bradycardia  -Heart rate dropping down to the mid 40s in the setting of acute respiratory acidosis and propofol use.  Heart rate has since improved to low 50s.  Telemetry has been reviewed with MD.  No sign of junctional rhythm or third-degree heart block.  Patient has been in sinus bradycardia with occasional nonconducted PACs.  No current indication for pacing. -Cardiology service will continue to follow along  Acute respiratory failure: -Patient was seen in the [place] ED on 09/03/2021 and was diagnosed with pneumonia.  He was discharged on doxycycline.  He also received 20 mg of IV Lasix in the emergency room as well.  Since then, patient has been seen by Dr. Claiborne Billings on 09/05/2021 in office. -On physical exam, patient does not appears to be significantly volume overloaded.  Given elevated white blood cell count of 15.2 on arrival, significant concern of worsening pneumonia precipitating the respiratory failure.  Pulmonology service is on board  Chronic systolic heart failure/ischemic cardiomyopathy: Baseline EF of 30 to 35% on previous echocardiogram.  No obvious sign of significant volume overload.  CAD: No current sign to indicate  ACS.  Suspect current event is more respiratory driven rather than cardiac.  Possible septic shock: Although blood pressure was normal on arrival, blood pressure quickly dropped down to the 33O systolic.  Continue Levophed to help maintain blood pressure.  Hyperlipidemia  DM2  History of slow flow state in the ventricle after MI: On Eliquis to prevent formation of LV thrombus.  History of CVA    Risk Assessment/Risk Scores:        New York Heart Association (NYHA) Functional Class NYHA Class III        For questions or updates, please contact [facility] Please consult [email or web address] for contact info under    Hilbert Corrigan, [place]  09/07/2021 10:02 AM

## 2021-09-07 NOTE — ED Notes (Signed)
Notified William NP that patient's BP is trending lower now 100/57 MAP of 70 and starting to exhibit intermittent periods of bradycardia. Awaiting response at this time.

## 2021-09-07 NOTE — ED Notes (Signed)
Notified Dr. Lake Bells of CVP of 22.

## 2021-09-07 NOTE — Progress Notes (Signed)
[facility]  Called back to bedside for bradycardia and shock Monitor: sinus brady 40's, BP 80'/30's Exam: cool extremities, mottling feet/hands  Stopped propofol Started levophed titrated to MAP > 65 Started fentanyl Placed CVL Discussed with cardiology Check CVP Check coox Place a-line  [name], MD [place] P[facility] Pager: [phone] Cell: [phone] After 7:00 pm call Elink  [phone]

## 2021-09-07 NOTE — Progress Notes (Signed)
Echocardiogram 2D Echocardiogram has been performed.  Bobby [name] 09/07/2021, 12:01 PM

## 2021-09-07 NOTE — ED Notes (Signed)
Cardiology at bedside.

## 2021-09-07 NOTE — Sepsis Progress Note (Signed)
Following per sepsis protocol   

## 2021-09-07 NOTE — ED Notes (Addendum)
Dr. Lake Bells at bedside evaluating. Orders for Levophed placed.

## 2021-09-07 NOTE — Progress Notes (Signed)
Pharmacy Antibiotic Note  Bobby [name] is a 76 y.o. male admitted on 09/07/2021 with pneumonia.  Pharmacy has been consulted for vancomycin and cefepime dosing.    SCr now down to 1.90 (has been fluctuating around this based on past labs.      Plan: Schedule Vancomycin 1250 mg IV every 24 hours Continue Cefepime 2g IV Q12H. Monitor renal function, culture results, and clinical status  Temp (24hrs), Avg:96.7 F (35.9 C), Min:95.6 F (35.3 C), Max:97.3 F (36.3 C)  Recent Labs  Lab 09/03/21 0959 09/04/21 1843 09/07/21 0600 09/07/21 0604  WBC 9.6 9.1 15.2*  --   CREATININE 1.64* 1.87* 2.27* 1.90*  LATICACIDVEN  --   --  1.6  --     Estimated Creatinine Clearance: 42.4 mL/min (A) (by C-G formula based on SCr of 1.9 mg/dL (H)).    Allergies  Allergen Reactions   Hydrocodone Itching    Thank you for allowing pharmacy to be a part of this patients care.  Sloan Leiter, PharmD, BCPS, BCCCP Clinical Pharmacist Please refer to [facility] for [place] numbers 09/07/2021 1:43 PM

## 2021-09-07 NOTE — Procedures (Signed)
Central Venous Catheter Insertion Procedure Note  Bobby [name]  [id number]  [birth year]  Date:09/07/21  Time:9:58 AM   Provider Performing:Brent Mkenzie Dotts   Procedure: Insertion of Non-tunneled Central Venous Catheter(36556) with US guidance (82993)   Indication(s) Medication administration  Consent Unable to obtain consent due to emergent nature of procedure.  Anesthesia Topical only with 1% lidocaine   Timeout Verified patient identification, verified procedure, site/side was marked, verified correct patient position, special equipment/implants available, medications/allergies/relevant history reviewed, required imaging and test results available.  Sterile Technique Maximal sterile technique including full sterile barrier drape, hand hygiene, sterile gown, sterile gloves, mask, hair covering, sterile ultrasound probe cover (if used).  Procedure Description Area of catheter insertion was cleaned with chlorhexidine and draped in sterile fashion.  Left subclavian attempted, could not be accessed.  With real-time ultrasound guidance a central venous catheter was placed into the left internal jugular vein. Nonpulsatile blood flow and easy flushing noted in all ports.  The catheter was sutured in place and sterile dressing applied.  Complications/Tolerance None; patient tolerated the procedure well. Chest X-ray is ordered to verify placement for internal jugular or subclavian cannulation.   Chest x-ray is not ordered for femoral cannulation.  EBL Minimal  Specimen(s) None   Bobby Awkward, MD [place] [facility] Pager: [phone] Cell: [phone] After 7:00 pm call Elink  [phone]

## 2021-09-08 ENCOUNTER — Inpatient Hospital Stay (HOSPITAL_COMMUNITY): Payer: Medicare Other

## 2021-09-08 DIAGNOSIS — J9621 Acute and chronic respiratory failure with hypoxia: Secondary | ICD-10-CM | POA: Diagnosis not present

## 2021-09-08 LAB — POCT I-STAT 7, (LYTES, BLD GAS, ICA,H+H)
Acid-base deficit: 4 mmol/L — ABNORMAL HIGH (ref 0.0–2.0)
Bicarbonate: 22.6 mmol/L (ref 20.0–28.0)
Calcium, Ion: 1.36 mmol/L (ref 1.15–1.40)
HCT: 34 % — ABNORMAL LOW (ref 39.0–52.0)
Hemoglobin: 11.6 g/dL — ABNORMAL LOW (ref 13.0–17.0)
O2 Saturation: 85 %
Patient temperature: 98
Potassium: 3.5 mmol/L (ref 3.5–5.1)
Sodium: 142 mmol/L (ref 135–145)
TCO2: 24 mmol/L (ref 22–32)
pCO2 arterial: 43.6 mmHg (ref 32.0–48.0)
pH, Arterial: 7.321 — ABNORMAL LOW (ref 7.350–7.450)
pO2, Arterial: 53 mmHg — ABNORMAL LOW (ref 83.0–108.0)

## 2021-09-08 LAB — MAGNESIUM: Magnesium: 1.7 mg/dL (ref 1.7–2.4)

## 2021-09-08 LAB — CBC
HCT: 38.1 % — ABNORMAL LOW (ref 39.0–52.0)
Hemoglobin: 11.8 g/dL — ABNORMAL LOW (ref 13.0–17.0)
MCH: 28.9 pg (ref 26.0–34.0)
MCHC: 31 g/dL (ref 30.0–36.0)
MCV: 93.2 fL (ref 80.0–100.0)
Platelets: 151 10*3/uL (ref 150–400)
RBC: 4.09 MIL/uL — ABNORMAL LOW (ref 4.22–5.81)
RDW: 14.4 % (ref 11.5–15.5)
WBC: 10.3 10*3/uL (ref 4.0–10.5)
nRBC: 0 % (ref 0.0–0.2)

## 2021-09-08 LAB — PROCALCITONIN: Procalcitonin: 0.2 ng/mL

## 2021-09-08 LAB — BASIC METABOLIC PANEL
Anion gap: 8 (ref 5–15)
BUN: 51 mg/dL — ABNORMAL HIGH (ref 8–23)
CO2: 22 mmol/L (ref 22–32)
Calcium: 9.2 mg/dL (ref 8.9–10.3)
Chloride: 108 mmol/L (ref 98–111)
Creatinine, Ser: 2.38 mg/dL — ABNORMAL HIGH (ref 0.61–1.24)
GFR, Estimated: 28 mL/min — ABNORMAL LOW (ref >60)
Glucose, Bld: 112 mg/dL — ABNORMAL HIGH (ref 70–99)
Potassium: 3.4 mmol/L — ABNORMAL LOW (ref 3.5–5.1)
Sodium: 138 mmol/L (ref 135–145)

## 2021-09-08 LAB — GLUCOSE, CAPILLARY
Glucose-Capillary: 96 mg/dL (ref 70–99)
Glucose-Capillary: 118 mg/dL — ABNORMAL HIGH (ref 70–99)
Glucose-Capillary: 136 mg/dL — ABNORMAL HIGH (ref 70–99)
Glucose-Capillary: 112 mg/dL — ABNORMAL HIGH (ref 70–99)
Glucose-Capillary: 151 mg/dL — ABNORMAL HIGH (ref 70–99)

## 2021-09-08 LAB — TRIGLYCERIDES: Triglycerides: 211 mg/dL — ABNORMAL HIGH (ref <150)

## 2021-09-08 LAB — PHOSPHORUS: Phosphorus: 3.5 mg/dL (ref 2.5–4.6)

## 2021-09-08 MED ORDER — PREDNISONE 10 MG PO TABS
10.0000 mg | ORAL_TABLET | Freq: Every day | ORAL | Status: DC
  Administered 2021-09-09 – 2021-09-11 (×3): 10 mg via ORAL
  Filled 2021-09-08 (×3): qty 1

## 2021-09-08 MED ORDER — DOCUSATE SODIUM 100 MG PO CAPS
100.0000 mg | ORAL_CAPSULE | Freq: Two times a day (BID) | ORAL | Status: DC
  Administered 2021-09-08 – 2021-09-11 (×6): 100 mg via ORAL
  Filled 2021-09-08 (×6): qty 1

## 2021-09-08 MED ORDER — GUAIFENESIN ER 600 MG PO TB12
1200.0000 mg | ORAL_TABLET | Freq: Two times a day (BID) | ORAL | Status: DC
  Administered 2021-09-08 – 2021-09-11 (×6): 1200 mg via ORAL
  Filled 2021-09-08 (×6): qty 2

## 2021-09-08 MED ORDER — AMLODIPINE BESYLATE 5 MG PO TABS
5.0000 mg | ORAL_TABLET | Freq: Every day | ORAL | Status: DC
  Administered 2021-09-08 – 2021-09-11 (×4): 5 mg via ORAL
  Filled 2021-09-08 (×4): qty 1

## 2021-09-08 MED ORDER — VANCOMYCIN HCL 1500 MG/300ML IV SOLN: 1500.0000 mg | INTRAVENOUS | Status: DC

## 2021-09-08 MED ORDER — ORAL CARE MOUTH RINSE
15.0000 mL | Freq: Two times a day (BID) | OROMUCOSAL | Status: DC
  Administered 2021-09-08 – 2021-09-09 (×4): 15 mL via OROMUCOSAL

## 2021-09-08 MED ORDER — APIXABAN 5 MG PO TABS
5.0000 mg | ORAL_TABLET | Freq: Two times a day (BID) | ORAL | Status: DC
  Administered 2021-09-08 – 2021-09-11 (×6): 5 mg via ORAL
  Filled 2021-09-08 (×6): qty 1

## 2021-09-08 MED ORDER — CHLORHEXIDINE GLUCONATE 0.12 % MT SOLN
15.0000 mL | Freq: Two times a day (BID) | OROMUCOSAL | Status: DC
  Administered 2021-09-08 – 2021-09-10 (×5): 15 mL via OROMUCOSAL
  Filled 2021-09-08 (×3): qty 15

## 2021-09-08 MED ORDER — ATORVASTATIN CALCIUM 80 MG PO TABS
80.0000 mg | ORAL_TABLET | Freq: Every day | ORAL | Status: DC
  Administered 2021-09-08 – 2021-09-10 (×3): 80 mg via ORAL
  Filled 2021-09-08 (×3): qty 1

## 2021-09-08 MED ORDER — PREDNISONE 10 MG PO TABS
10.0000 mg | ORAL_TABLET | Freq: Every day | ORAL | Status: DC
  Administered 2021-09-08: 10 mg
  Filled 2021-09-08: qty 1

## 2021-09-08 MED ORDER — BUPROPION HCL ER (XL) 150 MG PO TB24
300.0000 mg | ORAL_TABLET | Freq: Every day | ORAL | Status: DC
  Administered 2021-09-09 – 2021-09-11 (×3): 300 mg via ORAL
  Filled 2021-09-08: qty 1
  Filled 2021-09-08: qty 2
  Filled 2021-09-08: qty 1

## 2021-09-08 MED ORDER — CARVEDILOL 12.5 MG PO TABS
12.5000 mg | ORAL_TABLET | Freq: Two times a day (BID) | ORAL | Status: DC
  Administered 2021-09-08 – 2021-09-11 (×6): 12.5 mg via ORAL
  Filled 2021-09-08 (×6): qty 1

## 2021-09-08 MED ORDER — POLYETHYLENE GLYCOL 3350 17 G PO PACK: 17.0000 g | PACK | Freq: Every day | ORAL | Status: DC | PRN

## 2021-09-08 MED ORDER — POLYETHYLENE GLYCOL 3350 17 G PO PACK
17.0000 g | PACK | Freq: Every day | ORAL | Status: DC
  Administered 2021-09-09 – 2021-09-11 (×3): 17 g via ORAL
  Filled 2021-09-08 (×3): qty 1

## 2021-09-08 MED ORDER — ASPIRIN 81 MG PO CHEW
81.0000 mg | CHEWABLE_TABLET | Freq: Every day | ORAL | Status: DC
  Administered 2021-09-09 – 2021-09-11 (×3): 81 mg via ORAL
  Filled 2021-09-08 (×3): qty 1

## 2021-09-08 NOTE — Progress Notes (Signed)
[place] Progress Note Patient Name: Bobby [name] DOB: [birth year] MRN: [id number]   Date of Service  09/08/2021  HPI/Events of Note  Patient is asking for Klonopin, his respiratory status is tenuous, and Klonopin is not on his home medications list.  eICU Interventions  Bedside RN instructed to explain to him that he is at high risk of requiring re-intubation, therefore  potentially sedating medications such as Klonopin are inadvisable.        Kerry Kass Jakhari Space 09/08/2021, 8:12 PM
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
## 2021-09-08 NOTE — Progress Notes (Signed)
Cardiology Progress Note  Patient ID: Bobby [name] MRN: [id number] DOB: 16-Sep-1945 Date of Encounter: 09/08/2021  Primary Cardiologist: Shelva Majestic, MD  Subjective   Chief Complaint: Intubated sedated on vent/  HPI: Off pressors.  Condition improving.  Plans for possible extubation today.  On treatment for pneumonia.  No further bradycardia.  ROS:  All other ROS reviewed and negative. Pertinent positives noted in the HPI.     Inpatient Medications  Scheduled Meds:  apixaban  5 mg Per Tube BID   arformoterol  15 mcg Nebulization BID   aspirin  81 mg Per Tube Daily   budesonide (PULMICORT) nebulizer solution  0.5 mg Nebulization BID   chlorhexidine gluconate (MEDLINE KIT)  15 mL Mouth Rinse BID   Chlorhexidine Gluconate Cloth  6 each Topical Daily   docusate  100 mg Per Tube BID   feeding supplement (PROSource TF)  45 mL Per Tube BID   feeding supplement (VITAL HIGH PROTEIN)  1,000 mL Per Tube Q24H   insulin aspart  0-15 Units Subcutaneous Q4H   mouth rinse  15 mL Mouth Rinse 10 times per day   pantoprazole (PROTONIX) IV  40 mg Intravenous QHS   polyethylene glycol  17 g Per Tube Daily   predniSONE  10 mg Per Tube Q breakfast   revefenacin  175 mcg Nebulization Daily   Continuous Infusions:  sodium chloride     sodium chloride     ceFEPime (MAXIPIME) IV Stopped (09/08/21 0636)   fentaNYL infusion INTRAVENOUS 200 mcg/hr (09/08/21 0800)   norepinephrine (LEVOPHED) Adult infusion     vancomycin Stopped (09/08/21 0739)   PRN Meds: Place/Maintain arterial line **AND** sodium chloride, albuterol, docusate, fentaNYL (SUBLIMAZE) injection, fentaNYL (SUBLIMAZE) injection, polyethylene glycol   Vital Signs   Vitals:   09/08/21 0800 09/08/21 0815 09/08/21 0830 09/08/21 0845  BP:      Pulse: 70 72 68 64  Resp: 20 18 20 20   Temp:      TempSrc:      SpO2: 97% 96% 97% 97%  Weight:      Height:        Intake/Output Summary (Last 24 hours) at 09/08/2021 0903 Last data filed at  09/08/2021 0800 Gross per 24 hour  Intake 1382.64 ml  Output 3310 ml  Net -1927.36 ml   Last 3 Weights 09/08/2021 09/07/2021 09/07/2021  Weight (lbs) 263 lb 7.2 oz 257 lb 15 oz 258 lb 9.6 oz  Weight (kg) 119.5 kg 117 kg 117.3 kg      Telemetry  Overnight telemetry shows sinus rhythm in the 70s, which I personally reviewed.   Physical Exam   Vitals:   09/08/21 0800 09/08/21 0815 09/08/21 0830 09/08/21 0845  BP:      Pulse: 70 72 68 64  Resp: 20 18 20 20   Temp:      TempSrc:      SpO2: 97% 96% 97% 97%  Weight:      Height:        Intake/Output Summary (Last 24 hours) at 09/08/2021 0903 Last data filed at 09/08/2021 0800 Gross per 24 hour  Intake 1382.64 ml  Output 3310 ml  Net -1927.36 ml    Last 3 Weights 09/08/2021 09/07/2021 09/07/2021  Weight (lbs) 263 lb 7.2 oz 257 lb 15 oz 258 lb 9.6 oz  Weight (kg) 119.5 kg 117 kg 117.3 kg    Body mass index is 38.9 kg/m.  General: Intubated on vent Head: Atraumatic, normal size  Eyes: PEERLA, EOMI  Neck: Supple, no JVD Endocrine: No thryomegaly Cardiac: Normal S1, S2; RRR; no murmurs, rubs, or gallops Lungs: Diminished breath sounds bilaterally Abd: Soft, nontender, no hepatomegaly  Ext: No edema, warm extremities Musculoskeletal: No deformities Neuro: Intubated and sedated on vent  Labs  High Sensitivity Troponin:   Recent Labs  Lab 09/03/21 0959 09/03/21 1154  TROPONINIHS 15 16     Cardiac EnzymesNo results for input(s): TROPONINI in the last 168 hours. No results for input(s): TROPIPOC in the last 168 hours.  Chemistry Recent Labs  Lab 09/04/21 1843 09/07/21 0600 09/07/21 0604 09/07/21 0629 09/07/21 1158 09/08/21 0330 09/08/21 0408  NA 139 142 142   < > 140 142 138  K 5.0 4.3 4.3   < > 4.2 3.5 3.4*  CL 109 108 110  --   --   --  108  CO2 23 25  --   --   --   --  22  GLUCOSE 137* 180* 176*  --   --   --  112*  BUN 28* 44* 45*  --   --   --  51*  CREATININE 1.87* 2.27* 1.90*  --   --   --  2.38*  CALCIUM 9.5 9.3  --    --   --   --  9.2  PROT 6.4* 7.0  --   --   --   --   --   ALBUMIN 3.7 4.0  --   --   --   --   --   AST 20 18  --   --   --   --   --   ALT 21 20  --   --   --   --   --   ALKPHOS 99 130*  --   --   --   --   --   BILITOT 0.9 0.5  --   --   --   --   --   GFRNONAA 37* 29*  --   --   --   --  28*  ANIONGAP 7 9  --   --   --   --  8   < > = values in this interval not displayed.    Hematology Recent Labs  Lab 09/04/21 1843 09/07/21 0600 09/07/21 0604 09/07/21 1158 09/08/21 0330 09/08/21 0408  WBC 9.1 15.2*  --   --   --  10.3  RBC 4.49 4.71  --   --   --  4.09*  HGB 13.0 13.8   < > 11.6* 11.6* 11.8*  HCT 42.3 45.6   < > 34.0* 34.0* 38.1*  MCV 94.2 96.8  --   --   --  93.2  MCH 29.0 29.3  --   --   --  28.9  MCHC 30.7 30.3  --   --   --  31.0  RDW 13.9 14.1  --   --   --  14.4  PLT 183 244  --   --   --  151   < > = values in this interval not displayed.   BNP Recent Labs  Lab 09/03/21 0959 09/04/21 1843 09/07/21 0600  BNP 786.2* 612.8* 898.3*    DDimer No results for input(s): DDIMER in the last 168 hours.   Radiology  DG Chest Port 1 View  Result Date: 09/08/2021 CLINICAL DATA:  Difficulty breathing EXAM: PORTABLE CHEST 1 VIEW COMPARISON:  Previous studies including the examination of 09/07/2021  FINDINGS: Transverse diameter of heart is increased. There is interval decrease in pulmonary vascular congestion and pulmonary edema. There is haziness in the right lower lung fields, possibly due to layering of pleural effusion. Left lateral CP angle is clear. There is no pneumothorax. Tip of endotracheal tube is 4.3 cm above the carina. Enteric tube is noted traversing the esophagus. Tip of left IJ central venous catheter is seen in superior vena cava. IMPRESSION: There is interval decrease in pulmonary vascular congestion and pulmonary edema. Right pleural effusion. There are no new focal infiltrates. Electronically Signed   By: Elmer Picker M.D.   On: 09/08/2021 08:12    DG Chest Portable 1 View  Result Date: 09/07/2021 CLINICAL DATA:  Ventilator dependence. EXAM: PORTABLE CHEST 1 VIEW COMPARISON:  09/04/2021 FINDINGS: 0602 hours. Endotracheal tube tip is approximately 5.4 cm above the base of the carina. The NG tube passes into the stomach although the distal tip position is not included on the film. The cardio pericardial silhouette is enlarged. There is pulmonary vascular congestion without overt pulmonary edema. Diffuse interstitial opacity suggests edema. No substantial pleural effusion. Telemetry leads overlie the chest. IMPRESSION: Vascular congestion with diffuse interstitial opacity suggests edema. Lung bases show improved aeration compared to the prior study. Electronically Signed   By: Misty Stanley M.D.   On: 09/07/2021 06:16   ECHOCARDIOGRAM COMPLETE  Result Date: 09/07/2021    ECHOCARDIOGRAM REPORT   Patient Name:   Bobby [name] Date of Exam: 09/07/2021 Medical Rec #:  [id number]      Height:       69.0 in Accession #:    [id number]     Weight:       258.6 lb Date of Birth:  1946-06-11       BSA:          2.304 m Patient Age:    70 years       BP:           149/91 mmHg Patient Gender: M              HR:           128 bpm. Exam Location:  Inpatient Procedure: 2D Echo and Intracardiac Opacification Agent Indications:    Dyspnea  History:        Patient has prior history of Echocardiogram examinations, most                 recent 06/05/2021. CAD, Signs/Symptoms:Shortness of Breath; Risk                 Factors:Diabetes and Hypertension.  Sonographer:    Arlyss Gandy Referring Phys: [id number] HAO MENG IMPRESSIONS  1. Poor acoustic windows limit study, even with Definity use. LVEF is severely depressed with akinesis of the distal 1/3 of ventricle Aneurysmal dilitation of interventricular septum, distal inferior wall. No significant change from echo of October 2022. Left ventricular ejection fraction, by estimation, is 25 to 30%. The left ventricle has severely decreased  function. The left ventricular internal cavity size was moderately dilated. There is mild left ventricular hypertrophy.  2. Right ventricular systolic function is normal. The right ventricular size is normal.  3. Left atrial size was mildly dilated.  4. Right atrial size was mildly dilated.  5. Mild mitral valve regurgitation.  6. The aortic valve is tricuspid. Aortic valve regurgitation is not visualized. Aortic valve sclerosis is present, with no evidence of aortic valve stenosis.  7. The inferior vena cava dilated.  FINDINGS  Left Ventricle: Poor acoustic windows limit study, even with Definity use. LVEF is severely depressed with akinesis of the distal 1/3 of ventricle Aneurysmal dilitation of interventricular septum, distal inferior wall. No significant change from echo of  October 2022. Left ventricular ejection fraction, by estimation, is 25 to 30%. The left ventricle has severely decreased function. The left ventricular internal cavity size was moderately dilated. There is mild left ventricular hypertrophy. Right Ventricle: The right ventricular size is normal. Right vetricular wall thickness was not assessed. Right ventricular systolic function is normal. Left Atrium: Left atrial size was mildly dilated. Right Atrium: Right atrial size was mildly dilated. Pericardium: There is no evidence of pericardial effusion. Mitral Valve: There is mild thickening of the mitral valve leaflet(s). Mild mitral annular calcification. Mild mitral valve regurgitation. Tricuspid Valve: The tricuspid valve is normal in structure. Tricuspid valve regurgitation is trivial. Aortic Valve: The aortic valve is tricuspid. Aortic valve regurgitation is not visualized. Aortic valve sclerosis is present, with no evidence of aortic valve stenosis. Aortic valve mean gradient measures 3.0 mmHg. Aortic valve peak gradient measures 5.0  mmHg. Aortic valve area, by VTI measures 2.33 cm. Pulmonic Valve: The pulmonic valve was normal in  structure. Pulmonic valve regurgitation is trivial. Aorta: The aortic root is normal in size and structure. Venous: The inferior vena cava dilated. IAS/Shunts: No atrial level shunt detected by color flow Doppler.  LEFT VENTRICLE PLAX 2D LVIDd:         5.70 cm   Diastology LVIDs:         5.00 cm   LV e' medial:    4.05 cm/s LV PW:         1.20 cm   LV E/e' medial:  23.6 LV IVS:        1.20 cm   LV e' lateral:   10.90 cm/s LVOT diam:     2.20 cm   LV E/e' lateral: 8.8 LV SV:         57 LV SV Index:   25 LVOT Area:     3.80 cm  RIGHT VENTRICLE            IVC RV Basal diam:  3.60 cm    IVC diam: 2.60 cm RV S prime:     7.70 cm/s TAPSE (M-mode): 2.1 cm LEFT ATRIUM             Index        RIGHT ATRIUM           Index LA diam:        4.20 cm 1.82 cm/m   RA Area:     24.20 cm LA Vol (A2C):   55.8 ml 24.21 ml/m  RA Volume:   77.50 ml  33.63 ml/m LA Vol (A4C):   80.8 ml 35.06 ml/m LA Biplane Vol: 69.9 ml 30.33 ml/m  AORTIC VALVE AV Area (Vmax):    2.50 cm AV Area (Vmean):   2.35 cm AV Area (VTI):     2.33 cm AV Vmax:           112.00 cm/s AV Vmean:          78.200 cm/s AV VTI:            0.245 m AV Peak Grad:      5.0 mmHg AV Mean Grad:      3.0 mmHg LVOT Vmax:         73.70 cm/s LVOT Vmean:  48.300 cm/s LVOT VTI:          0.150 m LVOT/AV VTI ratio: 0.61  AORTA Ao Root diam: 3.50 cm Ao Asc diam:  3.30 cm MITRAL VALVE MV Area (PHT): 4.41 cm    SHUNTS MV Decel Time: 172 msec    Systemic VTI:  0.15 m MV E velocity: 95.50 cm/s  Systemic Diam: 2.20 cm MV A velocity: 59.10 cm/s MV E/A ratio:  1.62 Dorris Carnes MD Electronically signed by Dorris Carnes MD Signature Date/Time: 09/07/2021/2:37:18 PM    Final     Cardiac Studies  TTE 09/07/2021  1. Poor acoustic windows limit study, even with Definity use. LVEF is  severely depressed with akinesis of the distal 1/3 of ventricle Aneurysmal  dilitation of interventricular septum, distal inferior wall. No  significant change from echo of October  2022. Left ventricular  ejection fraction, by estimation, is 25 to 30%. The  left ventricle has severely decreased function. The left ventricular  internal cavity size was moderately dilated. There is mild left  ventricular hypertrophy.   2. Right ventricular systolic function is normal. The right ventricular  size is normal.   3. Left atrial size was mildly dilated.   4. Right atrial size was mildly dilated.   5. Mild mitral valve regurgitation.   6. The aortic valve is tricuspid. Aortic valve regurgitation is not  visualized. Aortic valve sclerosis is present, with no evidence of aortic  valve stenosis.   7. The inferior vena cava dilated.   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  Cardiology was consulted due to hypotension and bradycardia.  Assessment & Plan   #Acute hypercapnic/hypoxic respiratory failure secondary to pneumonia #Shock, septic -Initially admitted with worsening shortness of breath over several days.  Failed outpatient therapy for pneumonia.  Chest x-ray with right lower lobe infiltrate. -Initial blood gas with pH 7.11 and CO2 of 78.  pH is improving. -Extremities were warm.  Lactic acid was normal. -Cardiology was consulted after hypotension and bradycardia after being on propofol.  This appears to have resolved after cessation of propofol. -Overall suspect this is likely respiratory due to pneumonia.  I believe his hypotension and bradycardia were secondary to propofol.  This has improved. -White count 15,000 which also supports pneumonia. -He has been diuresed. -I believe you can get back on heart failure medications once he is out of ICU and no longer intubated.  His echo was unchanged from prior.  To me  all of this points to pneumonia.  Clearly heart failure can contribute but this is not the primary process.  It is reassuring that his hypotension and bradycardia have resolved.  #Sinus bradycardia/hypotension #RBBB/LAFB -Secondary to propofol.  Has improved off propofol. -He never had any evidence of high-grade conduction disease.  #Systolic heart failure, EF 30% -Echo largely unchanged.  He has an aneurysm of the apex. -He has been diuresed by the primary team. -Can restart heart failure medications once he is extubated on the ICU.  #CAD #Anterior STEMI in 2018 -Review of cardiac catheterization in 2018 demonstrated STEMI with PCI to the proximal LAD.  He underwent staged procedures to the circumflex and LM.  RCA was patent.  Prior stent noted in the prior RCA. -EKG this admission is nonischemic.  Does not  appear to represent a primary coronary event. -Continue aspirin and statin therapy.  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  He has been maintained on Eliquis since that time.  We will continue this if able.  If needs procedures can transition to heparin.  CRITICAL CARE Performed by: [place] T O'Neal  Total critical care time: 45 minutes. Critical care time was exclusive of separately billable procedures and treating other patients. Critical care was necessary to treat or prevent imminent or life-threatening deterioration. Critical care was time spent personally by me on the following activities: development of treatment plan with patient and/or surrogate as well as nursing, discussions with consultants, evaluation of patient's response to treatment, examination of patient, obtaining history from patient or surrogate, ordering and performing treatments and interventions, ordering and review of laboratory studies, ordering and review of radiographic studies, pulse oximetry and re-evaluation of patient's  condition.  Signed, Addison Naegeli. Audie Box, MD, [place]  09/08/2021 9:03 AM

## 2021-09-08 NOTE — Plan of Care (Signed)
Problem: Safety: Goal: Non-violent Restraint(s) 09/08/2021 1026 by Rocky Morel, RN Outcome: Completed/Met 09/08/2021 0758 by Rocky Morel, RN Outcome: Not Progressing

## 2021-09-08 NOTE — Procedures (Signed)
Extubation Procedure Note  Patient Details:   Name: DEMOND SHALLENBERGER DOB: 11-05-1945 MRN: [id number]   Airway Documentation:    Vent end date: 09/08/21 Vent end time: 1140   Evaluation  O2 sats: stable throughout Complications: No apparent complications Patient did tolerate procedure well. Bilateral Breath Sounds: Rhonchi   Yes  Pt extubated per physician order. Prior to extubation pt with positive cuff leak and suctioned via ETT/orally. Pt extubated with no complications and placed on 5L nasal cannula. Pt able to speak name, give a good cough and no stridor heard at this time. RT will continue to monitor and be available as needed.   Sharla Kidney 09/08/2021, 11:46 AM

## 2021-09-08 NOTE — Plan of Care (Signed)
  Problem: Safety: Goal: Non-violent Restraint(s) Outcome: Not Progressing   Problem: Education: Goal: Knowledge of General Education information will improve Description: Including pain rating scale, medication(s)/side effects and non-pharmacologic comfort measures Outcome: Not Progressing   Problem: Health Behavior/Discharge Planning: Goal: Ability to manage health-related needs will improve Outcome: Not Progressing   Problem: Clinical Measurements: Goal: Ability to maintain clinical measurements within normal limits will improve Outcome: Not Progressing Goal: Will remain free from infection Outcome: Not Progressing Goal: Diagnostic test results will improve Outcome: Not Progressing Goal: Respiratory complications will improve Outcome: Not Progressing Goal: Cardiovascular complication will be avoided Outcome: Not Progressing   Problem: Activity: Goal: Risk for activity intolerance will decrease Outcome: Not Progressing   Problem: Nutrition: Goal: Adequate nutrition will be maintained Outcome: Not Progressing   Problem: Coping: Goal: Level of anxiety will decrease Outcome: Not Progressing   Problem: Elimination: Goal: Will not experience complications related to bowel motility Outcome: Not Progressing Goal: Will not experience complications related to urinary retention Outcome: Not Progressing   Problem: Pain Managment: Goal: General experience of comfort will improve Outcome: Not Progressing   Problem: Safety: Goal: Ability to remain free from injury will improve Outcome: Not Progressing   Problem: Skin Integrity: Goal: Risk for impaired skin integrity will decrease Outcome: Not Progressing   

## 2021-09-09 ENCOUNTER — Inpatient Hospital Stay (HOSPITAL_COMMUNITY): Payer: Medicare Other

## 2021-09-09 DIAGNOSIS — J9621 Acute and chronic respiratory failure with hypoxia: Secondary | ICD-10-CM | POA: Diagnosis not present

## 2021-09-09 LAB — BASIC METABOLIC PANEL
Anion gap: 5 (ref 5–15)
BUN: 47 mg/dL — ABNORMAL HIGH (ref 8–23)
CO2: 23 mmol/L (ref 22–32)
Calcium: 9.4 mg/dL (ref 8.9–10.3)
Chloride: 109 mmol/L (ref 98–111)
Creatinine, Ser: 2.09 mg/dL — ABNORMAL HIGH (ref 0.61–1.24)
GFR, Estimated: 32 mL/min — ABNORMAL LOW (ref >60)
Glucose, Bld: 91 mg/dL (ref 70–99)
Potassium: 3.8 mmol/L (ref 3.5–5.1)
Sodium: 137 mmol/L (ref 135–145)

## 2021-09-09 LAB — GLUCOSE, CAPILLARY
Glucose-Capillary: 106 mg/dL — ABNORMAL HIGH (ref 70–99)
Glucose-Capillary: 109 mg/dL — ABNORMAL HIGH (ref 70–99)
Glucose-Capillary: 129 mg/dL — ABNORMAL HIGH (ref 70–99)
Glucose-Capillary: 146 mg/dL — ABNORMAL HIGH (ref 70–99)
Glucose-Capillary: 83 mg/dL (ref 70–99)
Glucose-Capillary: 91 mg/dL (ref 70–99)
Glucose-Capillary: 99 mg/dL (ref 70–99)

## 2021-09-09 LAB — CBC
HCT: 39.2 % (ref 39.0–52.0)
Hemoglobin: 12.3 g/dL — ABNORMAL LOW (ref 13.0–17.0)
MCH: 29.3 pg (ref 26.0–34.0)
MCHC: 31.4 g/dL (ref 30.0–36.0)
MCV: 93.3 fL (ref 80.0–100.0)
Platelets: 157 10*3/uL (ref 150–400)
RBC: 4.2 MIL/uL — ABNORMAL LOW (ref 4.22–5.81)
RDW: 14.2 % (ref 11.5–15.5)
WBC: 10.4 10*3/uL (ref 4.0–10.5)
nRBC: 0 % (ref 0.0–0.2)

## 2021-09-09 LAB — MAGNESIUM: Magnesium: 2 mg/dL (ref 1.7–2.4)

## 2021-09-09 LAB — PHOSPHORUS: Phosphorus: 3.6 mg/dL (ref 2.5–4.6)

## 2021-09-09 MED ORDER — POTASSIUM CHLORIDE CRYS ER 20 MEQ PO TBCR
40.0000 meq | EXTENDED_RELEASE_TABLET | Freq: Two times a day (BID) | ORAL | Status: AC
  Administered 2021-09-09 (×2): 40 meq via ORAL
  Filled 2021-09-09 (×2): qty 2

## 2021-09-09 MED ORDER — SODIUM CHLORIDE 0.9 % IV SOLN
2.0000 g | INTRAVENOUS | Status: DC
  Administered 2021-09-09 – 2021-09-10 (×2): 2 g via INTRAVENOUS
  Filled 2021-09-09 (×2): qty 20

## 2021-09-09 MED ORDER — AZITHROMYCIN 250 MG PO TABS
500.0000 mg | ORAL_TABLET | Freq: Every day | ORAL | Status: AC
Start: 2021-09-09 — End: 2021-09-11
  Administered 2021-09-09 – 2021-09-11 (×3): 500 mg via ORAL
  Filled 2021-09-09 (×2): qty 1
  Filled 2021-09-09: qty 2

## 2021-09-09 MED ORDER — FUROSEMIDE 10 MG/ML IJ SOLN
60.0000 mg | Freq: Four times a day (QID) | INTRAMUSCULAR | Status: AC
  Administered 2021-09-09 (×2): 60 mg via INTRAVENOUS
  Filled 2021-09-09 (×2): qty 6
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
## 2021-09-09 NOTE — Progress Notes (Signed)
Pharmacist Heart Failure Core Measure Documentation  Assessment: Bobby [name] an EF documented as 25-30% on 09/07/21 by Lizbeth Bark.  Rationale: Heart failure patients with left ventricular systolic dysfunction (LVSD) and an EF < 40% should be prescribed an angiotensin converting enzyme inhibitor (ACEI) or angiotensin receptor blocker (ARB) at discharge unless a contraindication is documented in the medical record.  This patient is not currently on an ACEI or ARB for HF.  This note is being placed in the record in order to provide documentation that a contraindication to the use of these agents is present for this encounter.  ACE Inhibitor or Angiotensin Receptor Blocker is contraindicated (specify all that apply)  []   ACEI allergy AND ARB allergy []   Angioedema []   Moderate or severe aortic stenosis []   Hyperkalemia []   Hypotension []   Renal artery stenosis [x]   Worsening renal function, preexisting renal disease or dysfunction   [name] 09/09/2021 3:30 PM

## 2021-09-09 NOTE — Progress Notes (Signed)
° °  Reviewed Dr. Kathalene Frames note from yesterday.  Please see for details.  No new recommendations.  Please call if further assistance is needed.  Candee Furbish, MD

## 2021-09-10 DIAGNOSIS — R338 Other retention of urine: Secondary | ICD-10-CM

## 2021-09-10 DIAGNOSIS — J189 Pneumonia, unspecified organism: Secondary | ICD-10-CM

## 2021-09-10 DIAGNOSIS — I5023 Acute on chronic systolic (congestive) heart failure: Secondary | ICD-10-CM

## 2021-09-10 DIAGNOSIS — J449 Chronic obstructive pulmonary disease, unspecified: Secondary | ICD-10-CM

## 2021-09-10 LAB — CBC
HCT: 42.1 % (ref 39.0–52.0)
Hemoglobin: 13 g/dL (ref 13.0–17.0)
MCH: 28.6 pg (ref 26.0–34.0)
MCHC: 30.9 g/dL (ref 30.0–36.0)
MCV: 92.7 fL (ref 80.0–100.0)
Platelets: 162 10*3/uL (ref 150–400)
RBC: 4.54 MIL/uL (ref 4.22–5.81)
RDW: 13.8 % (ref 11.5–15.5)
WBC: 10.7 10*3/uL — ABNORMAL HIGH (ref 4.0–10.5)
nRBC: 0 % (ref 0.0–0.2)

## 2021-09-10 LAB — BASIC METABOLIC PANEL
Anion gap: 11 (ref 5–15)
BUN: 48 mg/dL — ABNORMAL HIGH (ref 8–23)
CO2: 24 mmol/L (ref 22–32)
Calcium: 9.7 mg/dL (ref 8.9–10.3)
Chloride: 105 mmol/L (ref 98–111)
Creatinine, Ser: 2.34 mg/dL — ABNORMAL HIGH (ref 0.61–1.24)
GFR, Estimated: 28 mL/min — ABNORMAL LOW (ref >60)
Glucose, Bld: 94 mg/dL (ref 70–99)
Potassium: 4.3 mmol/L (ref 3.5–5.1)
Sodium: 140 mmol/L (ref 135–145)

## 2021-09-10 LAB — GLUCOSE, CAPILLARY
Glucose-Capillary: 87 mg/dL (ref 70–99)
Glucose-Capillary: 90 mg/dL (ref 70–99)
Glucose-Capillary: 105 mg/dL — ABNORMAL HIGH (ref 70–99)
Glucose-Capillary: 125 mg/dL — ABNORMAL HIGH (ref 70–99)
Glucose-Capillary: 98 mg/dL (ref 70–99)

## 2021-09-10 LAB — MAGNESIUM: Magnesium: 1.9 mg/dL (ref 1.7–2.4)

## 2021-09-10 LAB — PHOSPHORUS: Phosphorus: 3.7 mg/dL (ref 2.5–4.6)

## 2021-09-10 MED ORDER — TAMSULOSIN HCL 0.4 MG PO CAPS
0.4000 mg | ORAL_CAPSULE | Freq: Every day | ORAL | Status: DC
  Administered 2021-09-10 – 2021-09-11 (×2): 0.4 mg via ORAL
  Filled 2021-09-10 (×3): qty 1

## 2021-09-10 MED ORDER — INSULIN ASPART 100 UNIT/ML IJ SOLN
0.0000 [IU] | Freq: Three times a day (TID) | INTRAMUSCULAR | Status: DC
  Administered 2021-09-10: 2 [IU] via SUBCUTANEOUS

## 2021-09-10 NOTE — Assessment & Plan Note (Addendum)
Acute on chronic respiratory failure with hypoxia Septic shock History of COPD  Patient treated with antibiotics.  Initially was intubated in the ICU.  Extubated successfully.  Patient mentions that he is feeling better.  Remains on antibacterials.  Noted to be on ceftriaxone and azithromycin.  Also on prednisone.  He may be on prednisone on a chronic basis. Respiratory status is stable. He is chronically on 3 L of oxygen at home. Patient feels much better.  Wants to go home today.  Seems to be stable for discharge.

## 2021-09-10 NOTE — Assessment & Plan Note (Addendum)
Old labs were reviewed.  His creatinine seems to be between 2.0-2.5 as of last year.  Presented with creatinine of 1.64.  Over the last few days creatinine has been between 2.0-2.5.  Renal function is stable.

## 2021-09-10 NOTE — Assessment & Plan Note (Addendum)
HbA1c 5.5.

## 2021-09-10 NOTE — Assessment & Plan Note (Addendum)
Seen by cardiology.  Patient with known EF of 30% based on cardiology notes.  Echocardiogram was done which was unchanged.  Patient was given diuretics with improvement.  Repeat chest x-ray did not show any pulm edema.  Furosemide was held.   Currently noted to be on carvedilol.  Cannot use ACE inhibitor or ARB due to renal dysfunction. Strict ins and outs.  Daily weights. Has a history of left ventricular apical aneurysm.  Has been on Eliquis for same.  This is being continued. Cardiac status is stable.

## 2021-09-10 NOTE — Assessment & Plan Note (Signed)
Stable.  He has a history of PCI to circumflex and LM.  RCA was patent in 2018.  No acute cardiac issues currently.  Also noted to be on statin.

## 2021-09-10 NOTE — Assessment & Plan Note (Addendum)
Resume home medications

## 2021-09-10 NOTE — Hospital Course (Signed)
76 y/o male with multiple medical problems including chronic respiratory failure with hypoxemia on 3LNC, likely COPD, cigarette smoking, systolic heart failure, CAD, atrial fibrillation was intubated in the ER  for hypoxemic respiratory failure.  He went to the ER twice over the last few days, was prescribed doxycycline and given IV lasix and d/c home initially.  Returned and then left without completing care the following day.  He saw Dr. Claiborne Billings in clinic on 1/3 and due to increasing Cr his losartan and spironolactone were held with plans for f/u BMET in a few days.   EMS was called to his home on 1/4, he was noted to be hypoxemic, required BIPAP, became unresponsive and was intubated. He was admitted to the ICU.  Subsequently developed septic shock requiring pressors.  Stabilized and then transferred to hospitalist service.

## 2021-09-10 NOTE — Assessment & Plan Note (Signed)
See above

## 2021-09-10 NOTE — Evaluation (Signed)
Occupational Therapy Evaluation Patient Details Name: Bobby [name] MRN: [id number] DOB: 1946/03/30 Today's Date: 09/10/2021   History of Present Illness Pt is a 76 y.o. male admitted 09/07/21 with SOB; workup for acute on chronic hypoxic/hypercapnic respiratory failure, question R-side PNA. Pt became unresponsive requriing intubation, developed septic shock requiring pressors. ETT 1/4-1/6. PMH includes HTN, HLD, COPD (3L O2 at night), PVCs, CAD, CHF, obesity, anxiety, depression.   Clinical Impression   Pt admitted with above. He demonstrates the below listed deficits and will benefit from continued OT to maximize safety and independence with BADLs.  Pt presents to OT with generalized weakness, decreased activity tolerance, mild balance deficits, and cognitive deficits (unsure of baseline).  He is able to complete UB ADLs with supervision/set up and LB ADLs with min - mod A - he demonstrates difficulty accessing his feet and may benefit from use of AE to increase independence.   He lives alone, with oversight from his sister, and has an aide for ~1-2 hours 3 days/week.  A neighbor supervises him with medications, and sister performs grocery shopping and meal prep.  Pt does drive.  Will follow while on acute and recommend HHOT.        Recommendations for follow up therapy are one component of a multi-disciplinary discharge planning process, led by the attending physician.  Recommendations may be updated based on patient status, additional functional criteria and insurance authorization.   Follow Up Recommendations  Home health OT    Assistance Recommended at Discharge Intermittent Supervision/Assistance  Patient can return home with the following Assistance with cooking/housework;Direct supervision/assist for medications management;Direct supervision/assist for financial management;Assist for transportation (recommend pt not drive until cleared by MD at follow up)    Functional Status Assessment   Patient has had a recent decline in their functional status and demonstrates the ability to make significant improvements in function in a reasonable and predictable amount of time.  Equipment Recommendations  None recommended by OT    Recommendations for Other Services       Precautions / Restrictions Precautions Precautions: Fall      Mobility Bed Mobility Overal bed mobility: Needs Assistance Bed Mobility: Supine to Sit     Supine to sit: Min guard          Transfers Overall transfer level: Needs assistance Equipment used: 1 person hand held assist Transfers: Sit to/from Stand Sit to Stand: Min guard;From elevated surface                  Balance Overall balance assessment: Needs assistance Sitting-balance support: Feet supported;No upper extremity supported Sitting balance-Leahy Scale: Fair     Standing balance support: No upper extremity supported Standing balance-Leahy Scale: Fair                             ADL either performed or assessed with clinical judgement   ADL Overall ADL's : Needs assistance/impaired Eating/Feeding: Independent   Grooming: Wash/dry hands;Wash/dry face;Oral care;Brushing hair;Min guard;Standing   Upper Body Bathing: Supervision/ safety;Set up;Sitting   Lower Body Bathing: Minimal assistance;Sit to/from stand Lower Body Bathing Details (indicate cue type and reason): difficulty accessing feet Upper Body Dressing : Minimal assistance;Sitting Upper Body Dressing Details (indicate cue type and reason): due to lines Lower Body Dressing: Moderate assistance;Sit to/from stand Lower Body Dressing Details (indicate cue type and reason): difficulty accessing feet.  Pt reports he frequently does not wear socks or shoes due to difficulty  donning them Toilet Transfer: Minimal assistance;Ambulation;Comfort height toilet;Grab bars   Toileting- Clothing Manipulation and Hygiene: Min guard;Sit to/from stand        Functional mobility during ADLs: Min guard       Vision Patient Visual Report: No change from baseline       Perception     Praxis      Pertinent Vitals/Pain Pain Assessment: No/denies pain     Hand Dominance Right   Extremity/Trunk Assessment Upper Extremity Assessment Upper Extremity Assessment: Generalized weakness   Lower Extremity Assessment Lower Extremity Assessment: Defer to PT evaluation   Cervical / Trunk Assessment Cervical / Trunk Assessment: Kyphotic   Communication Communication Communication: HOH   Cognition Arousal/Alertness: Awake/alert Behavior During Therapy: WFL for tasks assessed/performed                                   General Comments: Pt is easily distracted.  He got lost ambulating the circle on [address], and went toward the exit then attempted to go in another room.  Daughter indicates that pt may have some difficulty at baseline, but is strong willed so difficult to accurately determine what is cognitive deficit vs personality     General Comments  Sp02 > 94% on 2L 02 with activity    Exercises     Shoulder Instructions      Home Living Family/patient expects to be discharged to:: Private residence Living Arrangements: Alone Available Help at Discharge: Personal care attendant;Available PRN/intermittently Type of Home: Apartment Home Access: Level entry (minor slope from parking lot)     Home Layout: One level     Bathroom Shower/Tub: Tub/shower unit;Curtain   Bathroom Toilet: Handicapped height     Home Equipment: Shower seat;Other (comment) (02 - uses 2L at home)   Additional Comments: has an aide for 1-2 hours M,W, F      Prior Functioning/Environment Prior Level of Function : Independent/Modified Independent             Mobility Comments: ambulates without AD ADLs Comments: mod I with ADLs.  Has an aide who M,W,F who assists with IADLs due to pt has back pain.  Pt's sister manages pt's affairs.   She performs grocery shopping and most of his meal prep so he only needs to heat up food.  A neighbor is paid to supervise pt with his medications daily and pt has medications pre- packaged.  sister manages the finances.        OT Problem List: Decreased activity tolerance;Impaired balance (sitting and/or standing);Decreased cognition;Decreased safety awareness;Decreased knowledge of use of DME or AE      OT Treatment/Interventions: Self-care/ADL training;Energy conservation;DME and/or AE instruction;Therapeutic activities;Cognitive remediation/compensation;Patient/family education;Balance training    OT Goals(Current goals can be found in the care plan section) Acute Rehab OT Goals Patient Stated Goal: to go home OT Goal Formulation: With patient/family Time For Goal Achievement: 09/24/21 Potential to Achieve Goals: Good ADL Goals Pt Will Perform Grooming: (P) with supervision;standing Pt Will Perform Lower Body Bathing: (P) with supervision;with adaptive equipment;sit to/from stand Pt Will Perform Lower Body Dressing: (P) with supervision;with adaptive equipment;sit to/from stand Pt Will Transfer to Toilet: (P) with supervision;ambulating;regular height toilet;grab bars Pt Will Perform Toileting - Clothing Manipulation and hygiene: (P) with supervision;sit to/from stand  OT Frequency: Min 2X/week    Co-evaluation PT/OT/SLP Co-Evaluation/Treatment: Yes Reason for Co-Treatment: To address functional/ADL transfers;For patient/therapist safety   OT goals  addressed during session: ADL's and self-care      AM-PAC OT "6 Clicks" Daily Activity     Outcome Measure Help from another person eating meals?: None Help from another person taking care of personal grooming?: A Little Help from another person toileting, which includes using toliet, bedpan, or urinal?: A Little Help from another person bathing (including washing, rinsing, drying)?: A Little Help from another person to put on and  taking off regular upper body clothing?: A Little Help from another person to put on and taking off regular lower body clothing?: A Lot 6 Click Score: 18   End of Session Equipment Utilized During Treatment: Oxygen;Gait belt Nurse Communication: Mobility status  Activity Tolerance: Patient tolerated treatment well Patient left: in chair;with call bell/phone within reach;with chair alarm set;with family/visitor present;with nursing/sitter in room  OT Visit Diagnosis: Unsteadiness on feet (R26.81);Cognitive communication deficit (R41.841)                Time: 3552-1747 OT Time Calculation (min): 27 min Charges:  OT General Charges $OT Visit: 1 Visit OT Evaluation $OT Eval Moderate Complexity: 1 Mod  Nilsa Nutting., OTR/L Acute Rehabilitation Services Pager [phone] Office [phone]   Lucille Passy M 09/10/2021, 2:48 PM

## 2021-09-10 NOTE — Plan of Care (Signed)

## 2021-09-10 NOTE — Progress Notes (Signed)
[facility] PROGRESS NOTE   RONEL RODEHEAVER HYQ:[id number] DOB: [birth year] DOA: 09/07/2021  3 DOS: the patient was seen and examined on 09/10/2021  PCP: Hayden Rasmussen, MD  Brief History and Hospital Course:  76 y/o male with multiple medical problems including chronic respiratory failure with hypoxemia on 3LNC, likely COPD, cigarette smoking, systolic heart failure, CAD, atrial fibrillation was intubated in the ER  for hypoxemic respiratory failure.  He went to the ER twice over the last few days, was prescribed doxycycline and given IV lasix and d/c home initially.  Returned and then left without completing care the following day.  He saw Dr. Claiborne Billings in clinic on 1/3 and due to increasing Cr his losartan and spironolactone were held with plans for f/u BMET in a few days.   EMS was called to his home on 1/4, he was noted to be hypoxemic, required BIPAP, became unresponsive and was intubated. He was admitted to the ICU.  Subsequently developed septic shock requiring pressors.  Stabilized and then transferred to hospitalist service.  Consultants: Cardiology.  Critical care medicine  Procedures: Central line placement.  Intubation.  Foley catheter placement.    Subjective: Patient mentions that he is feeling better.  Shortness of breath is improving.  Denies any chest pain.  No nausea vomiting.    Assessment/Plan:  CAP (community acquired pneumonia) Acute on chronic respiratory failure with hypoxia Septic shock History of COPD  Patient treated with antibiotics.  Initially was intubated in the ICU.  Extubated successfully.  Patient mentions that he is feeling better.  Remains on antibacterials.  Noted to be on ceftriaxone and azithromycin.  Also on prednisone.  He may be on prednisone on a chronic basis. Respiratory status is stable.  No significant wheezing appreciated on examination.  He is chronically on 3 L of oxygen at home.  Acute on chronic systolic congestive heart failure  ([place])- (present on admission) Seen by cardiology.  Patient with known EF of 30% based on cardiology notes.  Echocardiogram was done which was unchanged.  Patient was given diuretics with improvement.  Chest x-ray done yesterday does not show any pulmonary edema.  Hold Lasix for today. Currently noted to be on carvedilol.  Cannot use ACE inhibitor or ARB due to renal dysfunction. Strict ins and outs.  Daily weights. Has a history of left ventricular apical aneurysm.  Has been on Eliquis for same.  This is being continued.  AKI (acute kidney injury) ([place])- (present on admission) See above  CRI (chronic renal insufficiency), stage 3 (moderate) ([place])- (present on admission) Old labs were reviewed.  His creatinine seems to be between 2.0-2.5 as of last year.  Presented with creatinine of 1.64.  Over the last few days creatinine has been between 2.0-2.5.  Monitor urine output.  Avoid nephrotoxic agents.  Acute urinary retention Foley catheter was placed during his admission to the ICU.  Will initiate Flomax.  Voiding trial tomorrow.  Start mobilizing.  PT and OT evaluation  Type 2 diabetes mellitus (HCC) Monitor CBGs.  SSI to continue.  HbA1c 5.5.  CAD ,residual 40-50% LAD- (present on admission) Stable.  He has a history of PCI to circumflex and LM.  RCA was patent in 2018.  No acute cardiac issues currently.  Also noted to be on statin.  Essential hypertension Monitor blood pressures closely.  Noted to be on amlodipine.  Also on carvedilol.    Obesity Estimated body mass index is 38.42 kg/m as calculated from the following:  Height as of this encounter: 5\' 9"  (1.753 m).   Weight as of this encounter: 118 kg.    DVT Prophylaxis: On apixaban Code Status: Full code Family Communication: Discussed with patient.  No family at bedside Disposition Plan: PT and OT evaluation  Status is: Inpatient  Remains inpatient appropriate because: Acute respiratory  failure.         Medications: Scheduled:  amLODipine  5 mg Oral Daily   apixaban  5 mg Oral BID   arformoterol  15 mcg Nebulization BID   aspirin  81 mg Oral Daily   atorvastatin  80 mg Oral QHS   azithromycin  500 mg Oral Daily   budesonide (PULMICORT) nebulizer solution  0.5 mg Nebulization BID   buPROPion  300 mg Oral Daily   carvedilol  12.5 mg Oral BID WC   chlorhexidine  15 mL Mouth Rinse BID   Chlorhexidine Gluconate Cloth  6 each Topical Daily   docusate sodium  100 mg Oral BID   guaiFENesin  1,200 mg Oral BID   insulin aspart  0-15 Units Subcutaneous Q4H   mouth rinse  15 mL Mouth Rinse q12n4p   polyethylene glycol  17 g Oral Daily   predniSONE  10 mg Oral Q breakfast   revefenacin  175 mcg Nebulization Daily   tamsulosin  0.4 mg Oral Daily   Continuous:  sodium chloride Stopped (09/09/21 0617)   sodium chloride     cefTRIAXone (ROCEPHIN)  IV 2 g (09/10/21 0942)   PRN:[CANCELED] Place/Maintain arterial line **AND** sodium chloride, albuterol, polyethylene glycol  Antibiotics: Anti-infectives (From admission, onward)    Start     Dose/Rate Route Frequency Ordered Stop   09/10/21 0600  vancomycin (VANCOREADY) IVPB 1500 mg/300 mL  Status:  Discontinued        1,500 mg 150 mL/hr over 120 Minutes Intravenous Every 48 hours 09/08/21 1042 09/09/21 0859   09/09/21 1000  azithromycin (ZITHROMAX) tablet 500 mg        500 mg Oral Daily 09/09/21 0900 09/12/21 0959   09/09/21 0945  cefTRIAXone (ROCEPHIN) 2 g in sodium chloride 0.9 % 100 mL IVPB        2 g 200 mL/hr over 30 Minutes Intravenous Every 24 hours 09/09/21 0900 09/14/21 0959   09/08/21 0600  vancomycin (VANCOREADY) IVPB 1250 mg/250 mL  Status:  Discontinued        1,250 mg 166.7 mL/hr over 90 Minutes Intravenous Every 24 hours 09/07/21 1347 09/08/21 1042   09/07/21 1800  ceFEPIme (MAXIPIME) 2 g in sodium chloride 0.9 % 100 mL IVPB  Status:  Discontinued        2 g 200 mL/hr over 30 Minutes Intravenous Every  12 hours 09/07/21 0651 09/09/21 0859   09/07/21 0651  vancomycin variable dose per unstable renal function (pharmacist dosing)  Status:  Discontinued         Does not apply See admin instructions 09/07/21 0651 09/07/21 1347   09/07/21 0630  vancomycin (VANCOCIN) IVPB 1000 mg/200 mL premix  Status:  Discontinued        1,000 mg 200 mL/hr over 60 Minutes Intravenous  Once 09/07/21 0616 09/07/21 0619   09/07/21 0630  ceFEPIme (MAXIPIME) 2 g in sodium chloride 0.9 % 100 mL IVPB        2 g 200 mL/hr over 30 Minutes Intravenous  Once 09/07/21 0616 09/07/21 0708   09/07/21 0630  vancomycin (VANCOREADY) IVPB 2000 mg/400 mL        2,000  mg 200 mL/hr over 120 Minutes Intravenous  Once 09/07/21 0620 09/07/21 0849       Objective:  Vital Signs  Vitals:   09/10/21 0730 09/10/21 0743 09/10/21 0800 09/10/21 0900  BP:   (!) 148/116   Pulse: 64  70 71  Resp: (!) 22  18 (!) 21  Temp:  97.8 F (36.6 C)    TempSrc:  Oral    SpO2: 99%  99% 100%  Weight:      Height:        Intake/Output Summary (Last 24 hours) at 09/10/2021 1009 Last data filed at 09/10/2021 0942 Gross per 24 hour  Intake 634.23 ml  Output 4150 ml  Net -3515.77 ml   Filed Weights   09/08/21 0500 09/09/21 0500 09/10/21 0500  Weight: 119.5 kg 117.9 kg 118 kg    General appearance: Awake alert.  In no distress Resp: Normal effort at rest.  Few crackles at the bases.  No wheezing or rhonchi. Cardio: S1-S2 is normal regular.  No S3-S4.  No rubs murmurs or bruit GI: Abdomen is soft.  Nontender nondistended.  Bowel sounds are present normal.  No masses organomegaly Extremities: No edema.  Noted to be moving all of his extremities Neurologic: Alert and oriented x3.  No focal neurological deficits.    Lab Results:  Data Reviewed: I have personally reviewed labs and imaging study reports  CBC: Recent Labs  Lab 09/04/21 1843 09/07/21 0600 09/07/21 0604 09/07/21 1158 09/08/21 0330 09/08/21 0408 09/09/21 0407  09/10/21 0347  WBC 9.1 15.2*  --   --   --  10.3 10.4 10.7*  NEUTROABS 7.5 9.7*  --   --   --   --   --   --   HGB 13.0 13.8   < > 11.6* 11.6* 11.8* 12.3* 13.0  HCT 42.3 45.6   < > 34.0* 34.0* 38.1* 39.2 42.1  MCV 94.2 96.8  --   --   --  93.2 93.3 92.7  PLT 183 244  --   --   --  151 157 162   < > = values in this interval not displayed.    Basic Metabolic Panel: Recent Labs  Lab 09/04/21 1843 09/07/21 0600 09/07/21 0604 09/07/21 0629 09/07/21 1158 09/07/21 1735 09/08/21 0330 09/08/21 0408 09/09/21 0407 09/10/21 0347  NA 139 142 142   < > 140  --  142 138 137 140  K 5.0 4.3 4.3   < > 4.2  --  3.5 3.4* 3.8 4.3  CL 109 108 110  --   --   --   --  108 109 105  CO2 23 25  --   --   --   --   --  22 23 24   GLUCOSE 137* 180* 176*  --   --   --   --  112* 91 94  BUN 28* 44* 45*  --   --   --   --  51* 47* 48*  CREATININE 1.87* 2.27* 1.90*  --   --   --   --  2.38* 2.09* 2.34*  CALCIUM 9.5 9.3  --   --   --   --   --  9.2 9.4 9.7  MG  --  1.9  --   --   --  1.7  --  1.7 2.0 1.9  PHOS  --  4.9*  --   --   --  2.1*  --  3.5 3.6  3.7   < > = values in this interval not displayed.    GFR: Estimated Creatinine Clearance: 34.6 mL/min (A) (by C-G formula based on SCr of 2.34 mg/dL (H)).  Liver Function Tests: Recent Labs  Lab 09/04/21 1843 09/07/21 0600  AST 20 18  ALT 21 20  ALKPHOS 99 130*  BILITOT 0.9 0.5  PROT 6.4* 7.0  ALBUMIN 3.7 4.0     Coagulation Profile: Recent Labs  Lab 09/07/21 0600  INR 1.1     CBG: Recent Labs  Lab 09/09/21 1540 09/09/21 2004 09/09/21 2319 09/10/21 0346 09/10/21 0743  GLUCAP 146* 129* 106* 87 90    Lipid Profile: Recent Labs    09/08/21 0408  TRIG 211*     Recent Results (from the past 240 hour(s))  Resp Panel by RT-PCR (Flu A&B, Covid) Nasopharyngeal Swab     Status: None   Collection Time: 09/03/21 10:00 AM   Specimen: Nasopharyngeal Swab; Nasopharyngeal(NP) swabs in vial transport medium  Result Value Ref Range  Status   SARS Coronavirus 2 by RT PCR NEGATIVE NEGATIVE Final    Comment: (NOTE) SARS-CoV-2 target nucleic acids are NOT DETECTED.  The SARS-CoV-2 RNA is generally detectable in upper respiratory specimens during the acute phase of infection. The lowest concentration of SARS-CoV-2 viral copies this assay can detect is 138 copies/mL. A negative result does not preclude SARS-Cov-2 infection and should not be used as the sole basis for treatment or other patient management decisions. A negative result may occur with  improper specimen collection/handling, submission of specimen other than nasopharyngeal swab, presence of viral mutation(s) within the areas targeted by this assay, and inadequate number of viral copies(<138 copies/mL). A negative result must be combined with clinical observations, patient history, and epidemiological information. The expected result is Negative.  Fact Sheet for Patients:  EntrepreneurPulse.com.au  Fact Sheet for Healthcare Providers:  IncredibleEmployment.be  This test is no t yet approved or cleared by the [place] FDA and  has been authorized for detection and/or diagnosis of SARS-CoV-2 by FDA under an Emergency Use Authorization (EUA). This EUA will remain  in effect (meaning this test can be used) for the duration of the COVID-19 declaration under Section 564(b)(1) of the Act, 21 U.S.C.section 360bbb-3(b)(1), unless the authorization is terminated  or revoked sooner.       Influenza A by PCR NEGATIVE NEGATIVE Final   Influenza B by PCR NEGATIVE NEGATIVE Final    Comment: (NOTE) The Xpert Xpress SARS-CoV-2/FLU/RSV plus assay is intended as an aid in the diagnosis of influenza from Nasopharyngeal swab specimens and should not be used as a sole basis for treatment. Nasal washings and aspirates are unacceptable for Xpert Xpress SARS-CoV-2/FLU/RSV testing.  Fact Sheet for  Patients: EntrepreneurPulse.com.au  Fact Sheet for Healthcare Providers: IncredibleEmployment.be  This test is not yet approved or cleared by the [place] FDA and has been authorized for detection and/or diagnosis of SARS-CoV-2 by FDA under an Emergency Use Authorization (EUA). This EUA will remain in effect (meaning this test can be used) for the duration of the COVID-19 declaration under Section 564(b)(1) of the Act, 21 U.S.C. section 360bbb-3(b)(1), unless the authorization is terminated or revoked.  Performed at [facility], [address] [place], [place] [address]   Resp Panel by RT-PCR (Flu A&B, Covid) Nasopharyngeal Swab     Status: None   Collection Time: 09/07/21  5:58 AM   Specimen: Nasopharyngeal Swab; Nasopharyngeal(NP) swabs in vial transport medium  Result Value  Ref Range Status   SARS Coronavirus 2 by RT PCR NEGATIVE NEGATIVE Final    Comment: (NOTE) SARS-CoV-2 target nucleic acids are NOT DETECTED.  The SARS-CoV-2 RNA is generally detectable in upper respiratory specimens during the acute phase of infection. The lowest concentration of SARS-CoV-2 viral copies this assay can detect is 138 copies/mL. A negative result does not preclude SARS-Cov-2 infection and should not be used as the sole basis for treatment or other patient management decisions. A negative result may occur with  improper specimen collection/handling, submission of specimen other than nasopharyngeal swab, presence of viral mutation(s) within the areas targeted by this assay, and inadequate number of viral copies(<138 copies/mL). A negative result must be combined with clinical observations, patient history, and epidemiological information. The expected result is Negative.  Fact Sheet for Patients:  EntrepreneurPulse.com.au  Fact Sheet for Healthcare Providers:  IncredibleEmployment.be  This test is no t  yet approved or cleared by the [place] FDA and  has been authorized for detection and/or diagnosis of SARS-CoV-2 by FDA under an Emergency Use Authorization (EUA). This EUA will remain  in effect (meaning this test can be used) for the duration of the COVID-19 declaration under Section 564(b)(1) of the Act, 21 U.S.C.section 360bbb-3(b)(1), unless the authorization is terminated  or revoked sooner.       Influenza A by PCR NEGATIVE NEGATIVE Final   Influenza B by PCR NEGATIVE NEGATIVE Final    Comment: (NOTE) The Xpert Xpress SARS-CoV-2/FLU/RSV plus assay is intended as an aid in the diagnosis of influenza from Nasopharyngeal swab specimens and should not be used as a sole basis for treatment. Nasal washings and aspirates are unacceptable for Xpert Xpress SARS-CoV-2/FLU/RSV testing.  Fact Sheet for Patients: EntrepreneurPulse.com.au  Fact Sheet for Healthcare Providers: IncredibleEmployment.be  This test is not yet approved or cleared by the [place] FDA and has been authorized for detection and/or diagnosis of SARS-CoV-2 by FDA under an Emergency Use Authorization (EUA). This EUA will remain in effect (meaning this test can be used) for the duration of the COVID-19 declaration under Section 564(b)(1) of the Act, 21 U.S.C. section 360bbb-3(b)(1), unless the authorization is terminated or revoked.  Performed at [facility], [place] [address], [place], [place] [address]   Culture, blood (routine x 2)     Status: None (Preliminary result)   Collection Time: 09/07/21  6:16 AM   Specimen: BLOOD  Result Value Ref Range Status   Specimen Description BLOOD RIGHT ANTECUBITAL  Final   Special Requests   Final    BOTTLES DRAWN AEROBIC AND ANAEROBIC Blood Culture adequate volume   Culture   Final    NO GROWTH 3 DAYS Performed at [facility], [place] [address], [place], [place] [address]    Report Status PENDING  Incomplete   Culture, blood (routine x 2)     Status: None (Preliminary result)   Collection Time: 09/07/21  6:24 AM   Specimen: BLOOD LEFT ARM  Result Value Ref Range Status   Specimen Description BLOOD LEFT ARM  Final   Special Requests   Final    BOTTLES DRAWN AEROBIC AND ANAEROBIC Blood Culture adequate volume   Culture   Final    NO GROWTH 3 DAYS Performed at [facility], [place] [address], [place], [place] [address]    Report Status PENDING  Incomplete  MRSA Next Gen by PCR, Nasal     Status: None   Collection Time: 09/07/21  1:47 PM  Specimen: Nasal Mucosa; Nasal Swab  Result Value Ref Range Status   MRSA by PCR Next Gen NOT DETECTED NOT DETECTED Final    Comment: (NOTE) The GeneXpert MRSA Assay (FDA approved for NASAL specimens only), is one component of a comprehensive MRSA colonization surveillance program. It is not intended to diagnose MRSA infection nor to guide or monitor treatment for MRSA infections. Test performance is not FDA approved in patients less than 14 years old. Performed at [facility], [place] [address], [place], [place] [address]       Radiology Studies: DG Chest Port 1 View  Result Date: 09/09/2021 CLINICAL DATA:  Pneumonia respiratory distress. EXAM: PORTABLE CHEST 1 VIEW COMPARISON:  09/08/2021 FINDINGS: 0425 hours. Interval extubation and NG tube removal. Left IJ central line tip overlies the innominate vein confluence. The cardio pericardial silhouette is enlarged. There is pulmonary vascular congestion without overt pulmonary edema. Basilar atelectasis noted without substantial pleural effusion. Telemetry leads overlie the chest. IMPRESSION: 1. Interval extubation. 2. Pulmonary vascular congestion without overt pulmonary edema. 3. Bibasilar atelectasis. Electronically Signed   By: Misty Stanley M.D.   On: 09/09/2021 08:03       LOS: 3 days   [facility] Pager on [email or web address]  09/10/2021, 10:09 AM

## 2021-09-10 NOTE — Evaluation (Addendum)
Physical Therapy Evaluation Patient Details Name: Bobby [name] MRN: [id number] DOB: 1945-09-28 Today's Date: 09/10/2021  History of Present Illness  Pt is a 76 y.o. male admitted 09/07/21 with SOB; workup for acute on chronic hypoxic/hypercapnic respiratory failure, question R-side PNA. Pt became unresponsive requiring intubation, developed septic shock requiring pressors. ETT 1/4-1/6. PMH includes HTN, HLD, COPD (2L O2 baseline), PVCs, CAD, CHF, obesity, anxiety, depression.   Clinical Impression  Pt presents with an overall decrease in functional mobility secondary to above. PTA, pt independent without DME, lives alone with aide assist 3x/wk for household tasks; pt's sister also helps manage care. Today, pt moving well with min guard and intermittent external assist to prevent LOB. Pt would benefit from continued acute PT services to maximize functional mobility and independence prior to d/c with HHPT services.     SpO2 >/94% on 2L O2 with ambulation, HR up to 100s    Recommendations for follow up therapy are one component of a multi-disciplinary discharge planning process, led by the attending physician.  Recommendations may be updated based on patient status, additional functional criteria and insurance authorization.  Follow Up Recommendations Home health PT    Assistance Recommended at Discharge Intermittent Supervision/Assistance  Patient can return home with the following  Assistance with cooking/housework;Assist for transportation (recommend pt not drive until cleared by MD at follow up);Direct supervision/assist for financial management;Direct supervision/assist for medications management    Equipment Recommendations None recommended by PT        Functional Status Assessment Patient has had a recent decline in their functional status and demonstrates the ability to make significant improvements in function in a reasonable and predictable amount of time.     Precautions /  Restrictions Precautions Precautions: Fall;Other (comment) Precaution Comments: wears 2L O2 baseline Restrictions Weight Bearing Restrictions: No      Mobility  Bed Mobility Overal bed mobility: Needs Assistance Bed Mobility: Supine to Sit     Supine to sit: Supervision     General bed mobility comments: bed flat, increased time    Transfers Overall transfer level: Needs assistance Equipment used: 1 person hand held assist Transfers: Sit to/from Stand Sit to Stand: Min guard;From elevated surface                Ambulation/Gait Ambulation/Gait assistance: Min guard;Min assist Gait Distance (Feet): 400 Feet Assistive device: None Gait Pattern/deviations: Step-through pattern;Decreased stride length;Staggering right Gait velocity: Decreased     General Gait Details: Slow, mildly unsteady gait without DME, min guard for balance; 2x minA to prevent lateral LOB with turn and head turn; stability improving with distance, pt reports slight SOB; SpO2 >/94% on 2L O2 [place]  Stairs            Wheelchair Mobility    Modified Rankin (Stroke Patients Only)       Balance Overall balance assessment: Needs assistance Sitting-balance support: Feet supported;No upper extremity supported Sitting balance-Leahy Scale: Fair     Standing balance support: No upper extremity supported;During functional activity Standing balance-Leahy Scale: Fair                               Pertinent Vitals/Pain Pain Assessment: No/denies pain    Home Living Family/patient expects to be discharged to:: Private residence Living Arrangements: Alone Available Help at Discharge: Family;Personal care attendant;Available PRN/intermittently Type of Home: Apartment Home Access: Level entry       Home Layout: One level Home  Equipment: Electronics engineer Comments: Has aide for 1-2 hrs/day on M, W, F. Wears 2L O2 baseline    Prior Function Prior Level of Function :  Independent/Modified Independent;Needs assist       Physical Assist : ADLs (physical)   ADLs (physical): IADLs Mobility Comments: Independent ambulating withotu DME, drives ADLs Comments: mod I with ADLs.  Aide comes 3x/wk to assist with IADLs (primarily cleaning/household tasks). Pt's sister manages pt's affairs, including finances; she performs grocery shopping and most of meal prep so he only needs to heat up food. Neighbor is paid to supervise pt with medications daily and pt has medications pre-packaged. \     Hand Dominance   Dominant Hand: Right    Extremity/Trunk Assessment   Upper Extremity Assessment Upper Extremity Assessment: Generalized weakness    Lower Extremity Assessment Lower Extremity Assessment: Generalized weakness    Cervical / Trunk Assessment Cervical / Trunk Assessment: Kyphotic  Communication   Communication: HOH  Cognition Arousal/Alertness: Awake/alert Behavior During Therapy: WFL for tasks assessed/performed                                   General Comments: Pt easily distracted with some questionable memory deficits; daughter reports may have some difficulty with cognition at baseline, but pt is strong-willed so difficult to determine true cognitive deficit vs personality. He got lost ambulating the circle on [address], and went toward the exit then attempted to go in another room        General Comments General comments (skin integrity, edema, etc.): SpO2 >/94% on 2L O2 [place], HR up to 100s. [name] present and supportive (she works as Immunologist at another hospital), able to provide and verify home/PLOF info    Exercises     Assessment/Plan    PT Assessment Patient needs continued PT services  PT Problem List Decreased strength;Decreased activity tolerance;Decreased balance;Decreased mobility;Decreased cognition;Cardiopulmonary status limiting activity       PT Treatment Interventions DME instruction;Gait training;Functional  mobility training;Therapeutic activities;Therapeutic exercise;Balance training;Patient/family education    PT Goals (Current goals can be found in the Care Plan section)  Acute Rehab PT Goals Patient Stated Goal: "Get back to my truck" PT Goal Formulation: With patient Time For Goal Achievement: 09/24/21 Potential to Achieve Goals: Good    Frequency Min 3X/week     Co-evaluation   Reason for Co-Treatment: To address functional/ADL transfers;For patient/therapist safety   OT goals addressed during session: ADL's and self-care       AM-PAC PT "6 Clicks" Mobility  Outcome Measure Help needed turning from your back to your side while in a flat bed without using bedrails?: None Help needed moving from lying on your back to sitting on the side of a flat bed without using bedrails?: A Little Help needed moving to and from a bed to a chair (including a wheelchair)?: A Little Help needed standing up from a chair using your arms (e.g., wheelchair or bedside chair)?: A Little Help needed to walk in hospital room?: A Little Help needed climbing 3-5 steps with a railing? : A Little 6 Click Score: 19    End of Session Equipment Utilized During Treatment: Gait belt;Oxygen Activity Tolerance: Patient tolerated treatment well Patient left: in chair;with call bell/phone within reach;with chair alarm set;with family/visitor present Nurse Communication: Mobility status PT Visit Diagnosis: Other abnormalities of gait and mobility (R26.89);Muscle weakness (generalized) (M62.81)    Time: 1062-6948  PT Time Calculation (min) (ACUTE ONLY): 27 min   Charges:   PT Evaluation $PT Eval Moderate Complexity: 1 Mod        Mabeline Caras, PT, DPT Acute Rehabilitation Services  Pager [phone] Office [place] 09/10/2021, 4:27 PM

## 2021-09-10 NOTE — Assessment & Plan Note (Addendum)
Foley catheter was placed during his admission to the ICU.  Started on Flomax.  Foley catheter was removed.  He has been able to void on his own.

## 2021-09-11 LAB — CBC
HCT: 43.8 % (ref 39.0–52.0)
Hemoglobin: 14 g/dL (ref 13.0–17.0)
MCH: 29.1 pg (ref 26.0–34.0)
MCHC: 32 g/dL (ref 30.0–36.0)
MCV: 91.1 fL (ref 80.0–100.0)
Platelets: 181 10*3/uL (ref 150–400)
RBC: 4.81 MIL/uL (ref 4.22–5.81)
RDW: 13.6 % (ref 11.5–15.5)
WBC: 10.7 10*3/uL — ABNORMAL HIGH (ref 4.0–10.5)
nRBC: 0 % (ref 0.0–0.2)

## 2021-09-11 LAB — BASIC METABOLIC PANEL
Anion gap: 9 (ref 5–15)
BUN: 50 mg/dL — ABNORMAL HIGH (ref 8–23)
CO2: 25 mmol/L (ref 22–32)
Calcium: 9.8 mg/dL (ref 8.9–10.3)
Chloride: 106 mmol/L (ref 98–111)
Creatinine, Ser: 2.29 mg/dL — ABNORMAL HIGH (ref 0.61–1.24)
GFR, Estimated: 29 mL/min — ABNORMAL LOW (ref >60)
Glucose, Bld: 90 mg/dL (ref 70–99)
Potassium: 4.7 mmol/L (ref 3.5–5.1)
Sodium: 140 mmol/L (ref 135–145)

## 2021-09-11 LAB — GLUCOSE, CAPILLARY
Glucose-Capillary: 92 mg/dL (ref 70–99)
Glucose-Capillary: 111 mg/dL — ABNORMAL HIGH (ref 70–99)

## 2021-09-11 MED ORDER — TAMSULOSIN HCL 0.4 MG PO CAPS: 0.4000 mg | ORAL_CAPSULE | Freq: Every day | ORAL | 0 refills | Status: AC

## 2021-09-11 MED ORDER — CEFDINIR 300 MG PO CAPS: 300.0000 mg | ORAL_CAPSULE | Freq: Two times a day (BID) | ORAL | 0 refills | Status: AC

## 2021-09-11 MED ORDER — CEFDINIR 300 MG PO CAPS
300.0000 mg | ORAL_CAPSULE | Freq: Two times a day (BID) | ORAL | Status: DC
  Administered 2021-09-11: 300 mg via ORAL
  Filled 2021-09-11: qty 1

## 2021-09-11 MED ORDER — GUAIFENESIN ER 600 MG PO TB12: 600.0000 mg | ORAL_TABLET | Freq: Two times a day (BID) | ORAL | 0 refills | Status: AC

## 2021-09-11 NOTE — Progress Notes (Signed)
Went over discharge instructions with the patient and patient's sister at the bedside. Patient made aware and acknowledge the medication changes provided in the summary and what pharmacy to pick up the added medications. Patient verbalize understanding of creating a PCP follow-up when able and the signs and symptoms to call their PCP and when to re-visit the ED. RN removed both PIV and patient tolerated well. Both PIV sites are clean, dry and intact. Tele monitor removed and CCMD notified of patient's discharge. HHPT has been arranged. Transportation is at the bedside.

## 2021-09-11 NOTE — Discharge Summary (Signed)
Physician Discharge Summary   Patient: Bobby [name] MRN: [id number] DOB: 21-Jun-1946  Admit date:     09/07/2021  Discharge date: 09/11/2021  Discharge Physician: Bonnielee Haff   PCP: Hayden Rasmussen, MD   Recommendations at discharge:   Follow-up with PCP in 1 week Home health has been ordered  Discharge Diagnoses Principal Problem:   Acute on chronic respiratory failure with hypoxemia ([place]) Active Problems:   COPD (chronic obstructive pulmonary disease) (HCC)   Septic shock (HCC)   CAP (community acquired pneumonia)   Acute on chronic systolic congestive heart failure (HCC)   CRI (chronic renal insufficiency), stage 3 (moderate) (HCC)   AKI (acute kidney injury) ([place])   Type 2 diabetes mellitus ([place])   Acute urinary retention   Essential hypertension   CAD ,residual 40-50% LAD   Hospital Course   76 y/o male with multiple medical problems including chronic respiratory failure with hypoxemia on 3LNC, likely COPD, cigarette smoking, systolic heart failure, CAD, atrial fibrillation was intubated in the ER  for hypoxemic respiratory failure.  He went to the ER twice over the last few days, was prescribed doxycycline and given IV lasix and d/c home initially.  Returned and then left without completing care the following day.  He saw Dr. Claiborne Billings in clinic on 1/3 and due to increasing Cr his losartan and spironolactone were held with plans for f/u BMET in a few days.   EMS was called to his home on 1/4, he was noted to be hypoxemic, required BIPAP, became unresponsive and was intubated. He was admitted to the ICU.  Subsequently developed septic shock requiring pressors.  Stabilized and then transferred to hospitalist service.  CAP (community acquired pneumonia) Acute on chronic respiratory failure with hypoxia Septic shock History of COPD  Patient treated with antibiotics.  Initially was intubated in the ICU.  Extubated successfully.  Patient mentions that he is feeling better.   Remains on antibacterials.  Noted to be on ceftriaxone and azithromycin.  Also on prednisone.  He may be on prednisone on a chronic basis. Respiratory status is stable. He is chronically on 3 L of oxygen at home. Patient feels much better.  Wants to go home today.  Seems to be stable for discharge.  Acute on chronic systolic congestive heart failure ([place])- (present on admission) Seen by cardiology.  Patient with known EF of 30% based on cardiology notes.  Echocardiogram was done which was unchanged.  Patient was given diuretics with improvement.  Repeat chest x-ray did not show any pulm edema.  Furosemide was held.   Currently noted to be on carvedilol.  Cannot use ACE inhibitor or ARB due to renal dysfunction. Strict ins and outs.  Daily weights. Has a history of left ventricular apical aneurysm.  Has been on Eliquis for same.  This is being continued. Cardiac status is stable.  AKI (acute kidney injury) ([place])- (present on admission) See above  CRI (chronic renal insufficiency), stage 3 (moderate) ([place])- (present on admission) Old labs were reviewed.  His creatinine seems to be between 2.0-2.5 as of last year.  Presented with creatinine of 1.64.  Over the last few days creatinine has been between 2.0-2.5.  Renal function is stable.  Acute urinary retention Foley catheter was placed during his admission to the ICU.  Started on Flomax.  Foley catheter was removed.  He has been able to void on his own.  Type 2 diabetes mellitus (HCC) HbA1c 5.5.  CAD ,residual 40-50% LAD- (present on admission)  Stable.  He has a history of PCI to circumflex and LM.  RCA was patent in 2018.  No acute cardiac issues currently.  Also noted to be on statin.  Essential hypertension Resume home medications        Consultants: Critical care medicine Procedures performed: None Disposition: Home health Diet recommendation: Cardiac diet  DISCHARGE MEDICATION: Allergies as of 09/11/2021       Reactions    Hydrocodone Itching        Medication List     STOP taking these medications    doxycycline 100 MG capsule Commonly known as: VIBRAMYCIN   metFORMIN 500 MG 24 hr tablet Commonly known as: GLUCOPHAGE-XR       TAKE these medications    albuterol (2.5 MG/3ML) 0.083% nebulizer solution Commonly known as: PROVENTIL Take 5 mg by nebulization every 4 (four) hours as needed. For wheezing or SOB   albuterol 108 (90 Base) MCG/ACT inhaler Commonly known as: VENTOLIN HFA Inhale 2 puffs into the lungs every 6 (six) hours as needed for wheezing or shortness of breath.   amLODipine 5 MG tablet Commonly known as: NORVASC TAKE ONE TABLET BY MOUTH DAILY   apixaban 5 MG Tabs tablet Commonly known as: Eliquis Take 1 tablet (5 mg total) by mouth 2 (two) times daily.   aspirin EC 81 MG tablet Take 1 tablet (81 mg total) by mouth daily. Swallow whole. What changed: when to take this   atorvastatin 80 MG tablet Commonly known as: LIPITOR Take 1 tablet (80 mg total) by mouth daily at 6 PM.   benzonatate 100 MG capsule Commonly known as: TESSALON Take 100-200 mg by mouth 3 (three) times daily as needed for cough.   Breztri Aerosphere 160-9-4.8 MCG/ACT Aero Generic drug: Budeson-Glycopyrrol-Formoterol Inhale 2 puffs into the lungs 2 (two) times daily.   buPROPion 300 MG 24 hr tablet Commonly known as: WELLBUTRIN XL Take 300 mg by mouth daily.   carvedilol 25 MG tablet Commonly known as: COREG TAKE ONE TABLET BY MOUTH TWICE DAILY What changed: when to take this   cefdinir 300 MG capsule Commonly known as: OMNICEF Take 1 capsule (300 mg total) by mouth every 12 (twelve) hours for 4 days.   Colcrys 0.6 MG tablet Generic drug: colchicine Take 0.6 mg by mouth daily.   CoQ-10 100 MG Caps Take 100 mg by mouth daily.   FISH OIL PO Take 1 tablet by mouth daily.   guaiFENesin 600 MG 12 hr tablet Commonly known as: MUCINEX Take 1 tablet (600 mg total) by mouth 2 (two) times  daily for 14 days.   hydrALAZINE 10 MG tablet Commonly known as: APRESOLINE Take 1 tablet (10 mg total) by mouth 2 (two) times daily.   MUCINEX ALLERGY PO Take 1 tablet by mouth every 6 (six) hours as needed (cough).   nitroGLYCERIN 0.4 MG SL tablet Commonly known as: NITROSTAT DISSOLVE 1 TABLET UNDER THE TONGUE EVERY 5 MINUTES AS NEEDED FOR CHEST PAIN. DO NOT EXCEED A TOTAL OF 3 DOSES IN 15 MINUTES. What changed: See the new instructions.   oxyCODONE-acetaminophen 10-325 MG tablet Commonly known as: PERCOCET Take 1 tablet by mouth every 6 (six) hours as needed for pain.   predniSONE 10 MG tablet Commonly known as: DELTASONE Take 10 mg by mouth daily.   sertraline 100 MG tablet Commonly known as: ZOLOFT Take 100 mg by mouth daily.   tamsulosin 0.4 MG Caps capsule Commonly known as: FLOMAX Take 1 capsule (0.4 mg total) by  mouth daily for 7 days.        Follow-up Information     Hayden Rasmussen, MD. Schedule an appointment as soon as possible for a visit in 1 week(s).   Specialty: Family Medicine Why: Please follow up in a week. Contact information: [address] [place] [address] [phone]         [facility] Follow up.   Specialty: Home Health Services Why: HHPT Contact information: [place] [address] [phone]                 Discharge Exam: Filed Weights   09/10/21 0500 09/10/21 1804 09/11/21 0401  Weight: 118 kg 113.4 kg 113 kg   General appearance: Awake alert.  In no distress Resp: Clear to auscultation bilaterally.  Normal effort Cardio: S1-S2 is normal regular.  No S3-S4.  No rubs murmurs or bruit GI: Abdomen is soft.  Nontender nondistended.  Bowel sounds are present normal.  No masses organomegaly    Condition at discharge: fair  The results of significant diagnostics from this hospitalization (including imaging, microbiology, ancillary and laboratory) are listed  below for reference.   Imaging Studies: DG Chest 1 View  Result Date: 09/04/2021 CLINICAL DATA:  History of COPD.  Cough and shortness of breath. EXAM: CHEST  1 VIEW.  Patient is rotated COMPARISON:  Chest x-ray 09/03/2021, CT chest 08/23/2021 FINDINGS: The heart and mediastinal contours are unchanged. Aortic calcification. Bibasilar patchy airspace opacities. Bilateral trace pleural effusion. No pulmonary edema. No pleural effusion. No pneumothorax. No acute osseous abnormality. IMPRESSION: Bilateral trace pleural effusions and bilateral lower lung zone patchy airspace opacities. Electronically Signed   By: Iven Finn M.D.   On: 09/04/2021 20:06   DG Chest Port 1 View  Result Date: 09/09/2021 CLINICAL DATA:  Pneumonia respiratory distress. EXAM: PORTABLE CHEST 1 VIEW COMPARISON:  09/08/2021 FINDINGS: 0425 hours. Interval extubation and NG tube removal. Left IJ central line tip overlies the innominate vein confluence. The cardio pericardial silhouette is enlarged. There is pulmonary vascular congestion without overt pulmonary edema. Basilar atelectasis noted without substantial pleural effusion. Telemetry leads overlie the chest. IMPRESSION: 1. Interval extubation. 2. Pulmonary vascular congestion without overt pulmonary edema. 3. Bibasilar atelectasis. Electronically Signed   By: Misty Stanley M.D.   On: 09/09/2021 08:03   DG Chest Port 1 View  Result Date: 09/08/2021 CLINICAL DATA:  Difficulty breathing EXAM: PORTABLE CHEST 1 VIEW COMPARISON:  Previous studies including the examination of 09/07/2021 FINDINGS: Transverse diameter of heart is increased. There is interval decrease in pulmonary vascular congestion and pulmonary edema. There is haziness in the right lower lung fields, possibly due to layering of pleural effusion. Left lateral CP angle is clear. There is no pneumothorax. Tip of endotracheal tube is 4.3 cm above the carina. Enteric tube is noted traversing the esophagus. Tip of left IJ  central venous catheter is seen in superior vena cava. IMPRESSION: There is interval decrease in pulmonary vascular congestion and pulmonary edema. Right pleural effusion. There are no new focal infiltrates. Electronically Signed   By: Elmer Picker M.D.   On: 09/08/2021 08:12   DG Chest Portable 1 View  Result Date: 09/07/2021 CLINICAL DATA:  Ventilator dependence. EXAM: PORTABLE CHEST 1 VIEW COMPARISON:  09/04/2021 FINDINGS: 0602 hours. Endotracheal tube tip is approximately 5.4 cm above the base of the carina. The NG tube passes into the stomach although the distal tip position is not included on the  film. The cardio pericardial silhouette is enlarged. There is pulmonary vascular congestion without overt pulmonary edema. Diffuse interstitial opacity suggests edema. No substantial pleural effusion. Telemetry leads overlie the chest. IMPRESSION: Vascular congestion with diffuse interstitial opacity suggests edema. Lung bases show improved aeration compared to the prior study. Electronically Signed   By: Misty Stanley M.D.   On: 09/07/2021 06:16   DG Chest Port 1 View  Result Date: 09/03/2021 CLINICAL DATA:  Shortness of breath and cough for 2 days. EXAM: PORTABLE CHEST 1 VIEW COMPARISON:  November 05, 2019 FINDINGS: The heart size and mediastinal contours are within normal limits. There is mild opacity of the medial right lung base. There is no pleural effusion or pulmonary edema. The visualized skeletal structures are stable. IMPRESSION: Mild opacity of the medial right lung base, suspicious for pneumonia. Electronically Signed   By: Abelardo Diesel M.D.   On: 09/03/2021 10:24   ECHOCARDIOGRAM COMPLETE  Result Date: 09/07/2021    ECHOCARDIOGRAM REPORT   Patient Name:   HENRRY FEIL Sagun Date of Exam: 09/07/2021 Medical Rec #:  [id number]      Height:       69.0 in Accession #:    [id number]     Weight:       258.6 lb Date of Birth:  1945/11/10       BSA:          2.304 m Patient Age:    76 years       BP:            149/91 mmHg Patient Gender: M              HR:           128 bpm. Exam Location:  Inpatient Procedure: 2D Echo and Intracardiac Opacification Agent Indications:    Dyspnea  History:        Patient has prior history of Echocardiogram examinations, most                 recent 06/05/2021. CAD, Signs/Symptoms:Shortness of Breath; Risk                 Factors:Diabetes and Hypertension.  Sonographer:    Arlyss Gandy Referring Phys: [id number] HAO MENG IMPRESSIONS  1. Poor acoustic windows limit study, even with Definity use. LVEF is severely depressed with akinesis of the distal 1/3 of ventricle Aneurysmal dilitation of interventricular septum, distal inferior wall. No significant change from echo of October 2022. Left ventricular ejection fraction, by estimation, is 25 to 30%. The left ventricle has severely decreased function. The left ventricular internal cavity size was moderately dilated. There is mild left ventricular hypertrophy.  2. Right ventricular systolic function is normal. The right ventricular size is normal.  3. Left atrial size was mildly dilated.  4. Right atrial size was mildly dilated.  5. Mild mitral valve regurgitation.  6. The aortic valve is tricuspid. Aortic valve regurgitation is not visualized. Aortic valve sclerosis is present, with no evidence of aortic valve stenosis.  7. The inferior vena cava dilated. FINDINGS  Left Ventricle: Poor acoustic windows limit study, even with Definity use. LVEF is severely depressed with akinesis of the distal 1/3 of ventricle Aneurysmal dilitation of interventricular septum, distal inferior wall. No significant change from echo of  October 2022. Left ventricular ejection fraction, by estimation, is 25 to 30%. The left ventricle has severely decreased function. The left ventricular internal cavity size was moderately dilated. There is mild left ventricular hypertrophy.  Right Ventricle: The right ventricular size is normal. Right vetricular wall thickness was not  assessed. Right ventricular systolic function is normal. Left Atrium: Left atrial size was mildly dilated. Right Atrium: Right atrial size was mildly dilated. Pericardium: There is no evidence of pericardial effusion. Mitral Valve: There is mild thickening of the mitral valve leaflet(s). Mild mitral annular calcification. Mild mitral valve regurgitation. Tricuspid Valve: The tricuspid valve is normal in structure. Tricuspid valve regurgitation is trivial. Aortic Valve: The aortic valve is tricuspid. Aortic valve regurgitation is not visualized. Aortic valve sclerosis is present, with no evidence of aortic valve stenosis. Aortic valve mean gradient measures 3.0 mmHg. Aortic valve peak gradient measures 5.0  mmHg. Aortic valve area, by VTI measures 2.33 cm. Pulmonic Valve: The pulmonic valve was normal in structure. Pulmonic valve regurgitation is trivial. Aorta: The aortic root is normal in size and structure. Venous: The inferior vena cava dilated. IAS/Shunts: No atrial level shunt detected by color flow Doppler.  LEFT VENTRICLE PLAX 2D LVIDd:         5.70 cm   Diastology LVIDs:         5.00 cm   LV e' medial:    4.05 cm/s LV PW:         1.20 cm   LV E/e' medial:  23.6 LV IVS:        1.20 cm   LV e' lateral:   10.90 cm/s LVOT diam:     2.20 cm   LV E/e' lateral: 8.8 LV SV:         57 LV SV Index:   25 LVOT Area:     3.80 cm  RIGHT VENTRICLE            IVC RV Basal diam:  3.60 cm    IVC diam: 2.60 cm RV S prime:     7.70 cm/s TAPSE (M-mode): 2.1 cm LEFT ATRIUM             Index        RIGHT ATRIUM           Index LA diam:        4.20 cm 1.82 cm/m   RA Area:     24.20 cm LA Vol (A2C):   55.8 ml 24.21 ml/m  RA Volume:   77.50 ml  33.63 ml/m LA Vol (A4C):   80.8 ml 35.06 ml/m LA Biplane Vol: 69.9 ml 30.33 ml/m  AORTIC VALVE AV Area (Vmax):    2.50 cm AV Area (Vmean):   2.35 cm AV Area (VTI):     2.33 cm AV Vmax:           112.00 cm/s AV Vmean:          78.200 cm/s AV VTI:            0.245 m AV Peak Grad:       5.0 mmHg AV Mean Grad:      3.0 mmHg LVOT Vmax:         73.70 cm/s LVOT Vmean:        48.300 cm/s LVOT VTI:          0.150 m LVOT/AV VTI ratio: 0.61  AORTA Ao Root diam: 3.50 cm Ao Asc diam:  3.30 cm MITRAL VALVE MV Area (PHT): 4.41 cm    SHUNTS MV Decel Time: 172 msec    Systemic VTI:  0.15 m MV E velocity: 95.50 cm/s  Systemic Diam: 2.20 cm MV A velocity: 59.10  cm/s MV E/A ratio:  1.62 Dorris Carnes MD Electronically signed by Dorris Carnes MD Signature Date/Time: 09/07/2021/2:37:18 PM    Final    CT CHEST LUNG CA SCREEN LOW DOSE W/O CM  Result Date: 08/24/2021 CLINICAL DATA:  76 year old male with 45 pack-year history of smoking. Lung cancer screening. EXAM: CT CHEST WITHOUT CONTRAST LOW-DOSE FOR LUNG CANCER SCREENING TECHNIQUE: Multidetector CT imaging of the chest was performed following the standard protocol without IV contrast. COMPARISON:  None. FINDINGS: Cardiovascular: The heart size is normal. No substantial pericardial effusion. Coronary artery calcification is evident. Mediastinum/Nodes: No mediastinal lymphadenopathy. No evidence for gross hilar lymphadenopathy although assessment is limited by the lack of intravenous contrast on the current study. The esophagus has normal imaging features. There is no axillary lymphadenopathy. Lungs/Pleura: Tiny pulmonary nodules identified measuring up to maximum volume derived equivalent diameter 5.2 mm. No suspicious pulmonary nodule or mass. Compressive atelectasis noted in the lung bases. No focal airspace consolidation. No pleural effusion. Upper Abdomen: Hypoattenuating lesions in both kidneys cannot be definitively characterized but are likely cysts. 8 mm hyperattenuating lesion interpolar left kidney is likely a cyst complicated by proteinaceous debris or hemorrhage. Musculoskeletal: No worrisome lytic or sclerotic osseous abnormality. IMPRESSION: 1. Lung-RADS 2, benign appearance or behavior. Continue annual screening with low-dose chest CT without contrast in  12 months. 2. Coronary artery atherosclerosis. Electronically Signed   By: Misty Stanley M.D.   On: 08/24/2021 08:32   Microbiology: Results for orders placed or performed during the hospital encounter of 09/07/21  Resp Panel by RT-PCR (Flu A&B, Covid) Nasopharyngeal Swab     Status: None   Collection Time: 09/07/21  5:58 AM   Specimen: Nasopharyngeal Swab; Nasopharyngeal(NP) swabs in vial transport medium  Result Value Ref Range Status   SARS Coronavirus 2 by RT PCR NEGATIVE NEGATIVE Final    Comment: (NOTE) SARS-CoV-2 target nucleic acids are NOT DETECTED.  The SARS-CoV-2 RNA is generally detectable in upper respiratory specimens during the acute phase of infection. The lowest concentration of SARS-CoV-2 viral copies this assay can detect is 138 copies/mL. A negative result does not preclude SARS-Cov-2 infection and should not be used as the sole basis for treatment or other patient management decisions. A negative result may occur with  improper specimen collection/handling, submission of specimen other than nasopharyngeal swab, presence of viral mutation(s) within the areas targeted by this assay, and inadequate number of viral copies(<138 copies/mL). A negative result must be combined with clinical observations, patient history, and epidemiological information. The expected result is Negative.  Fact Sheet for Patients:  EntrepreneurPulse.com.au  Fact Sheet for Healthcare Providers:  IncredibleEmployment.be  This test is no t yet approved or cleared by the [place] FDA and  has been authorized for detection and/or diagnosis of SARS-CoV-2 by FDA under an Emergency Use Authorization (EUA). This EUA will remain  in effect (meaning this test can be used) for the duration of the COVID-19 declaration under Section 564(b)(1) of the Act, 21 U.S.C.section 360bbb-3(b)(1), unless the authorization is terminated  or revoked sooner.        Influenza A by PCR NEGATIVE NEGATIVE Final   Influenza B by PCR NEGATIVE NEGATIVE Final    Comment: (NOTE) The Xpert Xpress SARS-CoV-2/FLU/RSV plus assay is intended as an aid in the diagnosis of influenza from Nasopharyngeal swab specimens and should not be used as a sole basis for treatment. Nasal washings and aspirates are unacceptable for Xpert Xpress SARS-CoV-2/FLU/RSV testing.  Fact Sheet for Patients: EntrepreneurPulse.com.au  Fact  Sheet for Healthcare Providers: IncredibleEmployment.be  This test is not yet approved or cleared by the [place] and has been authorized for detection and/or diagnosis of SARS-CoV-2 by FDA under an Emergency Use Authorization (EUA). This EUA will remain in effect (meaning this test can be used) for the duration of the COVID-19 declaration under Section 564(b)(1) of the Act, 21 U.S.C. section 360bbb-3(b)(1), unless the authorization is terminated or revoked.  Performed at [facility], [place] [address] [place], [place] [address]   Culture, blood (routine x 2)     Status: None   Collection Time: 09/07/21  6:16 AM   Specimen: BLOOD  Result Value Ref Range Status   Specimen Description BLOOD RIGHT ANTECUBITAL  Final   Special Requests   Final    BOTTLES DRAWN AEROBIC AND ANAEROBIC Blood Culture adequate volume   Culture   Final    NO GROWTH 5 DAYS Performed at [facility], [place] [address] [place], [place] [address]    Report Status 09/12/2021 FINAL  Final  Culture, blood (routine x 2)     Status: None   Collection Time: 09/07/21  6:24 AM   Specimen: BLOOD LEFT ARM  Result Value Ref Range Status   Specimen Description BLOOD LEFT ARM  Final   Special Requests   Final    BOTTLES DRAWN AEROBIC AND ANAEROBIC Blood Culture adequate volume   Culture   Final    NO GROWTH 5 DAYS Performed at [facility], [place] [address] [place], [place] [address]    Report Status 09/12/2021 FINAL   Final  MRSA Next Gen by PCR, Nasal     Status: None   Collection Time: 09/07/21  1:47 PM   Specimen: Nasal Mucosa; Nasal Swab  Result Value Ref Range Status   MRSA by PCR Next Gen NOT DETECTED NOT DETECTED Final    Comment: (NOTE) The GeneXpert MRSA Assay (FDA approved for NASAL specimens only), is one component of a comprehensive MRSA colonization surveillance program. It is not intended to diagnose MRSA infection nor to guide or monitor treatment for MRSA infections. Test performance is not FDA approved in patients less than 67 years old. Performed at [facility], [place] [address] [place], [place] [address]     Labs: CBC: Recent Labs  Lab 09/07/21 0600 09/07/21 0604 09/08/21 0330 09/08/21 0408 09/09/21 0407 09/10/21 0347 09/11/21 0352  WBC 15.2*  --   --  10.3 10.4 10.7* 10.7*  NEUTROABS 9.7*  --   --   --   --   --   --   HGB 13.8   < > 11.6* 11.8* 12.3* 13.0 14.0  HCT 45.6   < > 34.0* 38.1* 39.2 42.1 43.8  MCV 96.8  --   --  93.2 93.3 92.7 91.1  PLT 244  --   --  151 157 162 181   < > = values in this interval not displayed.   Basic Metabolic Panel: Recent Labs  Lab 09/07/21 0600 09/07/21 0604 09/07/21 0629 09/07/21 1735 09/08/21 0330 09/08/21 0408 09/09/21 0407 09/10/21 0347 09/11/21 0352  NA 142 142   < >  --  142 138 137 140 140  K 4.3 4.3   < >  --  3.5 3.4* 3.8 4.3 4.7  CL 108 110  --   --   --  108 109 105 106  CO2 25  --   --   --   --  22 23 24 25   GLUCOSE 180* 176*  --   --   --  112* 91 94 90  BUN 44* 45*  --   --   --  51* 47* 48* 50*  CREATININE 2.27* 1.90*  --   --   --  2.38* 2.09* 2.34* 2.29*  CALCIUM 9.3  --   --   --   --  9.2 9.4 9.7 9.8  MG 1.9  --   --  1.7  --  1.7 2.0 1.9  --   PHOS 4.9*  --   --  2.1*  --  3.5 3.6 3.7  --    < > = values in this interval not displayed.   Liver Function Tests: Recent Labs  Lab 09/07/21 0600  AST 18  ALT 20  ALKPHOS 130*  BILITOT 0.5  PROT 7.0  ALBUMIN 4.0   CBG: Recent Labs  Lab  09/10/21 1149 09/10/21 1605 09/10/21 2107 09/11/21 0607 09/11/21 1140  GLUCAP 105* 125* 98 92 111*    Discharge time spent: greater than 30 minutes.  Signed: Bonnielee Haff, MD [facility] 09/12/2021

## 2021-09-11 NOTE — Progress Notes (Signed)
Occupational Therapy Treatment Patient Details Name: Bobby [name] MRN: [id number] DOB: 04-15-1946 Today's Date: 09/11/2021   History of present illness Pt is a 76 y.o. male admitted 09/07/21 with SOB; workup for acute on chronic hypoxic/hypercapnic respiratory failure, question R-side PNA. Pt became unresponsive requiring intubation, developed septic shock requiring pressors. ETT 1/4-1/6. PMH includes HTN, HLD, COPD (2L O2 baseline), PVCs, CAD, CHF, obesity, anxiety, depression.   OT comments  Patient continues to make steady progress towards goals in skilled OT session. Patient's session encompassed  education with regard to adaptive equipment to promote increased independence in lower body bathing and dressing. At baseline, patient uses a shower chair and has a long handled sponge to reach his feet. Patient also states that he uses slip on shoes predominantly, but educated on elastic shoelaces, and long handled shoe horn. Patient able to use sock aid after demonstration and handout provided for patient to have family member order. Discharge remains appropriate, therapy will continue to follow.    Recommendations for follow up therapy are one component of a multi-disciplinary discharge planning process, led by the attending physician.  Recommendations may be updated based on patient status, additional functional criteria and insurance authorization.    Follow Up Recommendations  Home health OT    Assistance Recommended at Discharge Intermittent Supervision/Assistance  Patient can return home with the following  Assistance with cooking/housework;Direct supervision/assist for medications management;Direct supervision/assist for financial management;Assist for transportation (recommend pt not drive until cleared by MD at follow up)   Equipment Recommendations  None recommended by OT    Recommendations for Other Services      Precautions / Restrictions Precautions Precautions: Fall;Other  (comment) Precaution Comments: wears 2L O2 baseline Restrictions Weight Bearing Restrictions: No       Mobility Bed Mobility Overal bed mobility: Needs Assistance Bed Mobility: Supine to Sit     Supine to sit: Supervision          Transfers                         Balance Overall balance assessment: Needs assistance Sitting-balance support: Feet supported;No upper extremity supported Sitting balance-Leahy Scale: Fair Sitting balance - Comments: able to use sock aid, unable to bend forward to doff sock without loss of balance                                   ADL either performed or assessed with clinical judgement   ADL Overall ADL's : Needs assistance/impaired                                     Functional mobility during ADLs: Set up General ADL Comments: Session focus on adaptive equipment demonstration for lower body dressing    Extremity/Trunk Assessment              Vision       Perception     Praxis      Cognition Arousal/Alertness: Awake/alert Behavior During Therapy: WFL for tasks assessed/performed Overall Cognitive Status: Within Functional Limits for tasks assessed                                            Exercises  Shoulder Instructions       General Comments      Pertinent Vitals/ Pain       Pain Assessment: No/denies pain  Home Living                                          Prior Functioning/Environment              Frequency  Min 2X/week        Progress Toward Goals  OT Goals(current goals can now be found in the care plan section)  Progress towards OT goals: Progressing toward goals  Acute Rehab OT Goals Patient Stated Goal: to go home OT Goal Formulation: With patient/family Time For Goal Achievement: 09/24/21 Potential to Achieve Goals: Good  Plan Discharge plan remains appropriate    Co-evaluation                  AM-PAC OT "6 Clicks" Daily Activity     Outcome Measure   Help from another person eating meals?: None Help from another person taking care of personal grooming?: A Little Help from another person toileting, which includes using toliet, bedpan, or urinal?: A Little Help from another person bathing (including washing, rinsing, drying)?: A Little Help from another person to put on and taking off regular upper body clothing?: A Little Help from another person to put on and taking off regular lower body clothing?: A Little (with use of adaptive equipment) 6 Click Score: 19    End of Session Equipment Utilized During Treatment: Other (comment) (sock aid, shoe horn, reacher)  OT Visit Diagnosis: Unsteadiness on feet (R26.81);Cognitive communication deficit (R41.841)   Activity Tolerance Patient tolerated treatment well   Patient Left in bed;with call bell/phone within reach;with bed alarm set   Nurse Communication Mobility status        Time: 4492-0100 OT Time Calculation (min): 15 min  Charges: OT General Charges $OT Visit: 1 Visit OT Treatments $Self Care/Home Management : 8-22 mins  Corinne [place], [place] [facility] [phone] 09/11/2021, 1:23 PM
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
## 2021-09-12 ENCOUNTER — Encounter: Payer: Self-pay | Admitting: Cardiovascular Disease

## 2021-09-12 LAB — CULTURE, BLOOD (ROUTINE X 2)
Culture: NO GROWTH
Culture: NO GROWTH
Special Requests: ADEQUATE
Special Requests: ADEQUATE

## 2021-11-20 ENCOUNTER — Telehealth (HOSPITAL_COMMUNITY): Payer: Self-pay

## 2021-11-20 NOTE — Telephone Encounter (Signed)
No response from pt in regards to pulmonary rehab. Closed referral. 

## 2022-01-09 ENCOUNTER — Ambulatory Visit (INDEPENDENT_AMBULATORY_CARE_PROVIDER_SITE_OTHER): Payer: Medicare Other | Admitting: Cardiovascular Disease

## 2022-01-09 ENCOUNTER — Encounter: Payer: Self-pay | Admitting: Cardiovascular Disease

## 2022-01-09 VITALS — BP 100/80 | HR 53 | Ht 69.0 in | Wt 243.6 lb

## 2022-01-09 DIAGNOSIS — I255 Ischemic cardiomyopathy: Secondary | ICD-10-CM

## 2022-01-09 DIAGNOSIS — G4733 Obstructive sleep apnea (adult) (pediatric): Secondary | ICD-10-CM

## 2022-01-09 DIAGNOSIS — I4510 Unspecified right bundle-branch block: Secondary | ICD-10-CM

## 2022-01-09 DIAGNOSIS — N1832 Chronic kidney disease, stage 3b: Secondary | ICD-10-CM | POA: Diagnosis not present

## 2022-01-09 DIAGNOSIS — E785 Hyperlipidemia, unspecified: Secondary | ICD-10-CM

## 2022-01-09 DIAGNOSIS — Z720 Tobacco use: Secondary | ICD-10-CM

## 2022-01-09 NOTE — Progress Notes (Signed)
I patient ID: Bobby [name], male   DOB: 07/08/1946, 76 y.o.   MRN: [id number] ? ? ? ?HPI: Bobby [name] is a 76 y.o. male presents to the office today for a 4 month followup evaluation.  ? ?Bobby [name]  has a long-standing tobacco history having started smoking at age 36, a history of hypertension, remote small TIA/CVA who presented to the hospital on 08/04/2012 in the setting of inferior ST segment elevation myocardial infarction. Catheterization by me revealed a 99% stenosis of the RCA and concomitant CAD involving his LAD and circumflex vessels. He underwent acute percutaneous coronary intervention with an excellent door to balloon time of only 20 minutes and ultimately insertion of a 3.25x32 mm DES stent post dilated 3.3. An echo done in the hospital showed an EF of 50-60% with mild basal inferior probable scar and moderate pulmonary hypertension with PA pressure of 45 mm. ? ?When I saw Bobby [name] in May 2014, he did note shortness of breath with activity. He denied definitive chest pain. At that time, he realized that he had inadvertentlystopped taking the atorvastatin 40 mg dose and this was resumed. In addition, his Toprol dose was increased from 50 to 75 mg daily. A nuclear study showed an ejection fraction of 66%. Perfusion was essentially normal with exception of a small region of fixed inferoseptal bowel artifact and possible small area of inferobasilar scar.  ? ?Bobby [name] unfortunately continues to smoke at least a pack of cigarettes per day.   He does experience shortness of breath with activity.  He denies recent chest pain.  He has chronic right bundle branch block with repolarization changes.  He is diabetic and has pulmonary hypertension. ? ?A follow-up nuclear perfusion study on 03/26/2014 remianed low risk and showed normal LV function and normal wall motion.  There was a fixed inferior defect that was felt most consistent with diaphragmatic attenuation.  There was no evidence for ischemia. ? ?He  underwent prostate surgery by Dr. [name] without cardiovascular compromise.   ? ?A screening abdominal aortic ultrasound revealed a normal abdominal aorta. ? ?Since I saw him in November 2017, he had an extensive history and has had recurrent hospitalizations he was hospitalized in October 2018 for left arm pain.  Troponin was negative.  In December 2018 he suffered an anterior ST segment elevation MI due to acute occlusion of his mid LAD and underwent successful PCI of his LAD with insertion of a 2.5 x 22 mm Resolute Onyx DES stent.  He was also found to have 99% dominant mid left circumflex lesion with 80% stenosis in the OM1 vessel and underwent staged intervention to the mid circumflex and marginal vessels.  Troponin was greater than 65.  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  There was mention of sludge is a sign of pre-thrombotic state but no definite thrombus was seen at the apex.  He returned to the hospital August 20, 2017 with malaise and dyspnea.  He was bradycardic.  He was diuresed.  He has renal insufficiency with creatinines increasing up to 1.9.  He has been followed by [name] on numerous occasions in the office.  Unfortunately continues to smoke cigarettes.  He admits to 1/2 pack/day.  He does note occasional dizziness as well as some shortness of breath.  He denies recurrent anginal type symptoms.   ?Suggestive of low flow state. ? ?When I saw him on  Jan 07, 2018  I recommended that he undergo a follow-up echo Doppler study to reassess systolic and diastolic function as well as potential for apical thrombus formation.  He had continued to be on aspirin and Brilinta following his STEMI.  He was on atorvastatin 80 mg for hyperlipidemia with target less than 70.  I had a long discussion regarding smoking cessation.  He underwent a follow-up echo Doppler study on Jan 20, 2018.  This showed reduced LV function with an EF  of 35 to 40% with akinesis of the mid apical anterolateral and septal as well as apical walls.  There was grade 1 diastolic dysfunction.  There was now a possible small layered thrombus at the apex noted using Definity and also evidence of swirling at the LV apex suggestive of low flow state.  ? ?As result of the echo findings, I saw him in follow-up on Jan 24, 2018 at which time I had a very long discussion with him guarding potential thromboembolic stroke risk.  He was started on warfarin anticoagulation.  Apparently, the patient's medications have been in a  pack and had been followed by his girlfriend.  He was taking his medications correctly resulting in significant over anticoagulation with INR reached greater than 10.  He has been followed very closely by our pharmacy department and he also was given vitamin K.  Due to his poor medication compliance after much discussion ultimately the thought is for him to transition to Eliquis once his INR gets below 2.  He also has been taking his blood pressure medicines incorrectly.  Patient has been sleeping most of the day.  In the past I had recommended he undergo a sleep evaluation for sleep apnea which he never followed up with it was a no-show in the sleep lab.  He was worked into my schedule on February 21, 2018 and was seen as an add-on due to his medication issues.  He was here now with his sister who in the past had done an excellent job in caring for his medications and she is committed now to resume doing this for him. ? ?During his evaluation, he was exceptionally somnolent, and a significant time was spent with medication adjustment.  Due to his inability to take effect of warfarin the decision was made to use Eliquis for anticoagulation with more consistent dosing.  His recent laboratory had shown stage IV chronic kidney disease and it was recommended that he hold his lisinopril for several days and then reduce the dose to 5 mg as well as holding his  furosemide with subsequent reduction of dose with reinstitution at 20 mg.  I scheduled him for an expeditious sleep study due to concerns for significant sleep apnea and he was seen5 days later by Bobby [name] with medication adjustment and anticoagulation issues. ? ?He underwent a sleep study on February 25, 2018 and he was found to have moderate overall sleep apnea with an AHI of 22.2 and RDI of 25.4.  He could not achieve any rem sleep and the overall severity may very well be underestimated.  He was titrated up to 10 cm water pressure.  An initial trial of CPAP auto with an EPR range of 8-15 was recommended.  When I last saw him on March 19, 2018 he had he had just initiated CPAP the evening before noted significant improvement in his sleep. His renal function had significantly improved from a creatinine of 2.7 down to 1.362 weeks ago. ? ?He is unaware of  any recurrent episodes of arrhythmia.  Apparently his blood pressure had become elevated and Dr. Darron Doom added losartan to his medical regimen.  Of note, he also already was on lisinopril 2.5 mg, furosemide 20 mg, carvedilol 6.25 mg twice a day.  He has been only intermittently using CPAP due to complaints of the mask on his nose bridge.  As result, he was recently changed to a different mask.  A download was obtained in the office today in which he does not meet compliance.  [employer] is his DME company.  His set up date was March 17, 2018 and he has until June 17, 2018 to demonstrate compliance.   ? ?When I saw him in September 2019 his blood pressure was elevated.  At that time apparently he was on both the previous lisinopril and the recent losartan that was started by Dr. Darron Doom.  I suggested he discontinue lisinopril and further titrated losartan to 25 mg twice a day both for improved blood pressure and for his reduced LV function.  He was not compliant with reference to his CPAP therapy and I had a long discussion with him regarding the importance that he meet  compliance by October 15. ? ?I saw him in October 2019.  At that time he was not using his CPAP therapy.  I reviewed his blood pressure recordings and they seem to be consistently elevated.  He continues to be on [employer]

## 2022-01-09 NOTE — Patient Instructions (Addendum)
Medication Instructions:  ? ?NO CHANGES ? ?*If you need a refill on your cardiac medications before your next appointment, please call your pharmacy* ? ? ?Lab Work: ? ?NOT NEEDED ? ? ?Testing/Procedures: ? ?NOT NEEDED ? ?Follow-Up: ?At [facility], you and your health needs are our priority.  As part of our continuing mission to provide you with exceptional heart care, we have created designated Provider Care Teams.  These Care Teams include your primary Cardiologist (physician) and Advanced Practice Providers (APPs -  Physician Assistants and Nurse Practitioners) who all work together to provide you with the care you need, when you need it. ? ?  ? ?Your next appointment:   ?6 month(s) -NOV 2023 ? ?The format for your next appointment:   ?In Person ? ?Provider:   ?Shelva Majestic, MD  ? ? ?Other Instructions  ? ?PLEASE BRING A COPY OR  MEDICATIONS IN THEIR BOTTLES TO THE NEXT APPOINTMENT ?

## 2022-01-15 ENCOUNTER — Encounter: Payer: Self-pay | Admitting: Cardiovascular Disease

## 2022-01-25 ENCOUNTER — Other Ambulatory Visit: Payer: Self-pay | Admitting: Cardiovascular Disease

## 2022-03-08 ENCOUNTER — Encounter (HOSPITAL_COMMUNITY): Payer: Self-pay

## 2022-03-08 ENCOUNTER — Inpatient Hospital Stay (HOSPITAL_COMMUNITY): Payer: Medicare Other

## 2022-03-08 ENCOUNTER — Inpatient Hospital Stay (HOSPITAL_COMMUNITY)
Admission: EM | Admit: 2022-03-08 | Discharge: 2022-03-12 | DRG: 227 | Disposition: A | Discharge status: Home / Self Care | Payer: Medicare Other | Attending: Cardiovascular Disease | Admitting: Cardiovascular Disease

## 2022-03-08 ENCOUNTER — Inpatient Hospital Stay (HOSPITAL_COMMUNITY)
Admission: EM | Disposition: A | Discharge status: Home / Self Care | Payer: Self-pay | Source: Home / Self Care | Attending: Cardiovascular Disease

## 2022-03-08 ENCOUNTER — Emergency Department (HOSPITAL_COMMUNITY): Payer: Medicare Other

## 2022-03-08 DIAGNOSIS — E7800 Pure hypercholesterolemia, unspecified: Secondary | ICD-10-CM | POA: Diagnosis present

## 2022-03-08 DIAGNOSIS — G8929 Other chronic pain: Secondary | ICD-10-CM | POA: Diagnosis present

## 2022-03-08 DIAGNOSIS — N183 Chronic kidney disease, stage 3 unspecified: Secondary | ICD-10-CM | POA: Diagnosis present

## 2022-03-08 DIAGNOSIS — I452 Bifascicular block: Secondary | ICD-10-CM | POA: Diagnosis present

## 2022-03-08 DIAGNOSIS — E669 Obesity, unspecified: Secondary | ICD-10-CM | POA: Diagnosis present

## 2022-03-08 DIAGNOSIS — I5023 Acute on chronic systolic (congestive) heart failure: Secondary | ICD-10-CM | POA: Diagnosis present

## 2022-03-08 DIAGNOSIS — R001 Bradycardia, unspecified: Principal | ICD-10-CM

## 2022-03-08 DIAGNOSIS — Z720 Tobacco use: Secondary | ICD-10-CM | POA: Diagnosis present

## 2022-03-08 DIAGNOSIS — E1122 Type 2 diabetes mellitus with diabetic chronic kidney disease: Secondary | ICD-10-CM | POA: Diagnosis present

## 2022-03-08 DIAGNOSIS — Z841 Family history of disorders of kidney and ureter: Secondary | ICD-10-CM

## 2022-03-08 DIAGNOSIS — J449 Chronic obstructive pulmonary disease, unspecified: Secondary | ICD-10-CM | POA: Diagnosis present

## 2022-03-08 DIAGNOSIS — Z800 Family history of malignant neoplasm of digestive organs: Secondary | ICD-10-CM | POA: Diagnosis not present

## 2022-03-08 DIAGNOSIS — I4510 Unspecified right bundle-branch block: Secondary | ICD-10-CM | POA: Diagnosis present

## 2022-03-08 DIAGNOSIS — Z955 Presence of coronary angioplasty implant and graft: Secondary | ICD-10-CM

## 2022-03-08 DIAGNOSIS — I252 Old myocardial infarction: Secondary | ICD-10-CM

## 2022-03-08 DIAGNOSIS — I441 Atrioventricular block, second degree: Secondary | ICD-10-CM | POA: Diagnosis present

## 2022-03-08 DIAGNOSIS — I5042 Chronic combined systolic (congestive) and diastolic (congestive) heart failure: Secondary | ICD-10-CM

## 2022-03-08 DIAGNOSIS — Z7901 Long term (current) use of anticoagulants: Secondary | ICD-10-CM

## 2022-03-08 DIAGNOSIS — I4430 Unspecified atrioventricular block: Secondary | ICD-10-CM | POA: Diagnosis present

## 2022-03-08 DIAGNOSIS — M549 Dorsalgia, unspecified: Secondary | ICD-10-CM | POA: Diagnosis present

## 2022-03-08 DIAGNOSIS — I119 Hypertensive heart disease without heart failure: Secondary | ICD-10-CM | POA: Diagnosis present

## 2022-03-08 DIAGNOSIS — Z7952 Long term (current) use of systemic steroids: Secondary | ICD-10-CM

## 2022-03-08 DIAGNOSIS — I639 Cerebral infarction, unspecified: Secondary | ICD-10-CM | POA: Diagnosis present

## 2022-03-08 DIAGNOSIS — I442 Atrioventricular block, complete: Secondary | ICD-10-CM | POA: Diagnosis present

## 2022-03-08 DIAGNOSIS — Z6835 Body mass index (BMI) 35.0-35.9, adult: Secondary | ICD-10-CM | POA: Diagnosis not present

## 2022-03-08 DIAGNOSIS — Z79899 Other long term (current) drug therapy: Secondary | ICD-10-CM | POA: Diagnosis not present

## 2022-03-08 DIAGNOSIS — N1832 Chronic kidney disease, stage 3b: Secondary | ICD-10-CM | POA: Diagnosis present

## 2022-03-08 DIAGNOSIS — I255 Ischemic cardiomyopathy: Secondary | ICD-10-CM | POA: Diagnosis present

## 2022-03-08 DIAGNOSIS — Z8249 Family history of ischemic heart disease and other diseases of the circulatory system: Secondary | ICD-10-CM

## 2022-03-08 DIAGNOSIS — F17210 Nicotine dependence, cigarettes, uncomplicated: Secondary | ICD-10-CM | POA: Diagnosis present

## 2022-03-08 DIAGNOSIS — I5022 Chronic systolic (congestive) heart failure: Secondary | ICD-10-CM | POA: Diagnosis present

## 2022-03-08 DIAGNOSIS — N179 Acute kidney failure, unspecified: Secondary | ICD-10-CM | POA: Diagnosis present

## 2022-03-08 DIAGNOSIS — G4733 Obstructive sleep apnea (adult) (pediatric): Secondary | ICD-10-CM | POA: Diagnosis present

## 2022-03-08 DIAGNOSIS — E119 Type 2 diabetes mellitus without complications: Secondary | ICD-10-CM

## 2022-03-08 DIAGNOSIS — I130 Hypertensive heart and chronic kidney disease with heart failure and stage 1 through stage 4 chronic kidney disease, or unspecified chronic kidney disease: Secondary | ICD-10-CM | POA: Diagnosis present

## 2022-03-08 DIAGNOSIS — I493 Ventricular premature depolarization: Secondary | ICD-10-CM | POA: Diagnosis present

## 2022-03-08 DIAGNOSIS — Z7982 Long term (current) use of aspirin: Secondary | ICD-10-CM

## 2022-03-08 DIAGNOSIS — N184 Chronic kidney disease, stage 4 (severe): Secondary | ICD-10-CM | POA: Diagnosis present

## 2022-03-08 DIAGNOSIS — I2510 Atherosclerotic heart disease of native coronary artery without angina pectoris: Secondary | ICD-10-CM | POA: Diagnosis present

## 2022-03-08 HISTORY — PX: TEMPORARY PACEMAKER: CATH118268

## 2022-03-08 LAB — COMPREHENSIVE METABOLIC PANEL
ALT: 31 U/L (ref 0–44)
AST: 20 U/L (ref 15–41)
Albumin: 3.6 g/dL (ref 3.5–5.0)
Alkaline Phosphatase: 89 U/L (ref 38–126)
Anion gap: 8 (ref 5–15)
BUN: 45 mg/dL — ABNORMAL HIGH (ref 8–23)
CO2: 20 mmol/L — ABNORMAL LOW (ref 22–32)
Calcium: 9.3 mg/dL (ref 8.9–10.3)
Chloride: 110 mmol/L (ref 98–111)
Creatinine, Ser: 2.75 mg/dL — ABNORMAL HIGH (ref 0.61–1.24)
GFR, Estimated: 23 mL/min — ABNORMAL LOW (ref >60)
Glucose, Bld: 144 mg/dL — ABNORMAL HIGH (ref 70–99)
Potassium: 4 mmol/L (ref 3.5–5.1)
Sodium: 138 mmol/L (ref 135–145)
Total Bilirubin: 0.7 mg/dL (ref 0.3–1.2)
Total Protein: 6.2 g/dL — ABNORMAL LOW (ref 6.5–8.1)

## 2022-03-08 LAB — CBC WITH DIFFERENTIAL/PLATELET
Abs Immature Granulocytes: 0.03 10*3/uL (ref 0.00–0.07)
Basophils Absolute: 0.1 10*3/uL (ref 0.0–0.1)
Basophils Relative: 1 %
Eosinophils Absolute: 0.3 10*3/uL (ref 0.0–0.5)
Eosinophils Relative: 3 %
HCT: 36.9 % — ABNORMAL LOW (ref 39.0–52.0)
Hemoglobin: 11.6 g/dL — ABNORMAL LOW (ref 13.0–17.0)
Immature Granulocytes: 0 %
Lymphocytes Relative: 15 %
Lymphs Abs: 1.2 10*3/uL (ref 0.7–4.0)
MCH: 28.7 pg (ref 26.0–34.0)
MCHC: 31.4 g/dL (ref 30.0–36.0)
MCV: 91.3 fL (ref 80.0–100.0)
Monocytes Absolute: 0.6 10*3/uL (ref 0.1–1.0)
Monocytes Relative: 7 %
Neutro Abs: 6.1 10*3/uL (ref 1.7–7.7)
Neutrophils Relative %: 74 %
Platelets: 140 10*3/uL — ABNORMAL LOW (ref 150–400)
RBC: 4.04 MIL/uL — ABNORMAL LOW (ref 4.22–5.81)
RDW: 15 % (ref 11.5–15.5)
WBC: 8.3 10*3/uL (ref 4.0–10.5)
nRBC: 0 % (ref 0.0–0.2)

## 2022-03-08 LAB — TROPONIN I (HIGH SENSITIVITY)
Troponin I (High Sensitivity): 29 ng/L — ABNORMAL HIGH (ref <18)
Troponin I (High Sensitivity): 28 ng/L — ABNORMAL HIGH (ref <18)

## 2022-03-08 LAB — T4, FREE: Free T4: 0.88 ng/dL (ref 0.61–1.12)

## 2022-03-08 LAB — BRAIN NATRIURETIC PEPTIDE: B Natriuretic Peptide: 789.1 pg/mL — ABNORMAL HIGH (ref 0.0–100.0)

## 2022-03-08 LAB — MRSA NEXT GEN BY PCR, NASAL: MRSA by PCR Next Gen: NOT DETECTED

## 2022-03-08 LAB — MAGNESIUM: Magnesium: 2 mg/dL (ref 1.7–2.4)

## 2022-03-08 LAB — TSH: TSH: 1.13 u[IU]/mL (ref 0.350–4.500)

## 2022-03-08 SURGERY — TEMPORARY PACEMAKER
Anesthesia: LOCAL

## 2022-03-08 MED ORDER — ACETAMINOPHEN 325 MG PO TABS: 650.0000 mg | ORAL_TABLET | ORAL | Status: DC | PRN

## 2022-03-08 MED ORDER — SODIUM CHLORIDE 0.9 % IV SOLN: INTRAVENOUS | Status: DC | PRN

## 2022-03-08 MED ORDER — LIDOCAINE HCL (PF) 1 % IJ SOLN
INTRAMUSCULAR | Status: AC
  Filled 2022-03-08: qty 30

## 2022-03-08 MED ORDER — HYDRALAZINE HCL 20 MG/ML IJ SOLN: 10.0000 mg | INTRAMUSCULAR | Status: AC | PRN

## 2022-03-08 MED ORDER — SODIUM CHLORIDE 0.9 % IV SOLN: 250.0000 mL | INTRAVENOUS | Status: DC | PRN

## 2022-03-08 MED ORDER — BUPROPION HCL ER (XL) 150 MG PO TB24
300.0000 mg | ORAL_TABLET | Freq: Every day | ORAL | Status: DC
  Administered 2022-03-09 – 2022-03-10 (×2): 300 mg via ORAL
  Filled 2022-03-08: qty 1
  Filled 2022-03-08 (×3): qty 2

## 2022-03-08 MED ORDER — ASPIRIN 81 MG PO TBEC
81.0000 mg | DELAYED_RELEASE_TABLET | Freq: Every day | ORAL | Status: DC
  Administered 2022-03-09 – 2022-03-10 (×2): 81 mg via ORAL
  Filled 2022-03-08 (×3): qty 1

## 2022-03-08 MED ORDER — ONDANSETRON HCL 4 MG/2ML IJ SOLN: 4.0000 mg | Freq: Four times a day (QID) | INTRAMUSCULAR | Status: DC | PRN

## 2022-03-08 MED ORDER — FLUTICASONE FUROATE-VILANTEROL 200-25 MCG/ACT IN AEPB
1.0000 | INHALATION_SPRAY | Freq: Every day | RESPIRATORY_TRACT | Status: DC
  Administered 2022-03-09 – 2022-03-12 (×4): 1 via RESPIRATORY_TRACT
  Filled 2022-03-08: qty 28

## 2022-03-08 MED ORDER — SODIUM CHLORIDE 0.9 % IV SOLN: INTRAVENOUS | Status: AC

## 2022-03-08 MED ORDER — LABETALOL HCL 5 MG/ML IV SOLN: 10.0000 mg | INTRAVENOUS | Status: AC | PRN

## 2022-03-08 MED ORDER — SERTRALINE HCL 100 MG PO TABS
100.0000 mg | ORAL_TABLET | Freq: Every day | ORAL | Status: DC
  Administered 2022-03-09 – 2022-03-10 (×2): 100 mg via ORAL
  Filled 2022-03-08 (×3): qty 1

## 2022-03-08 MED ORDER — UMECLIDINIUM BROMIDE 62.5 MCG/ACT IN AEPB
1.0000 | INHALATION_SPRAY | Freq: Every day | RESPIRATORY_TRACT | Status: DC
  Administered 2022-03-09 – 2022-03-12 (×4): 1 via RESPIRATORY_TRACT
  Filled 2022-03-08: qty 7

## 2022-03-08 MED ORDER — CHLORHEXIDINE GLUCONATE CLOTH 2 % EX PADS
6.0000 | MEDICATED_PAD | Freq: Every day | CUTANEOUS | Status: DC
  Administered 2022-03-08 – 2022-03-10 (×3): 6 via TOPICAL

## 2022-03-08 MED ORDER — BUDESON-GLYCOPYRROL-FORMOTEROL 160-9-4.8 MCG/ACT IN AERO: 2.0000 | INHALATION_SPRAY | Freq: Two times a day (BID) | RESPIRATORY_TRACT | Status: DC

## 2022-03-08 MED ORDER — SODIUM CHLORIDE 0.9% FLUSH
3.0000 mL | Freq: Two times a day (BID) | INTRAVENOUS | Status: DC
  Administered 2022-03-08 – 2022-03-12 (×5): 3 mL via INTRAVENOUS

## 2022-03-08 MED ORDER — ORAL CARE MOUTH RINSE: 15.0000 mL | OROMUCOSAL | Status: DC | PRN

## 2022-03-08 MED ORDER — PREDNISONE 10 MG PO TABS
10.0000 mg | ORAL_TABLET | Freq: Every day | ORAL | Status: DC
Start: 2022-03-08 — End: 2022-03-11
  Administered 2022-03-08 – 2022-03-10 (×3): 10 mg via ORAL
  Filled 2022-03-08 (×4): qty 2

## 2022-03-08 MED ORDER — HEPARIN (PORCINE) IN NACL 1000-0.9 UT/500ML-% IV SOLN
INTRAVENOUS | Status: AC
  Filled 2022-03-08: qty 500

## 2022-03-08 MED ORDER — SODIUM CHLORIDE 0.9% FLUSH: 3.0000 mL | INTRAVENOUS | Status: DC | PRN

## 2022-03-08 MED ORDER — ATORVASTATIN CALCIUM 80 MG PO TABS
80.0000 mg | ORAL_TABLET | Freq: Every day | ORAL | Status: DC
  Administered 2022-03-08 – 2022-03-11 (×4): 80 mg via ORAL
  Filled 2022-03-08 (×4): qty 1

## 2022-03-08 SURGICAL SUPPLY — 7 items
CATH S G BIP PACING (CATHETERS) ×1 IMPLANT
PINNACLE LONG 6F 25CM (SHEATH) ×2
PROTECTION STATION PRESSURIZED (MISCELLANEOUS) ×2
SHEATH INTRO PINNACLE 6F 25CM (SHEATH) IMPLANT
SHEATH PROBE COVER 6X72 (BAG) ×1 IMPLANT
STATION PROTECTION PRESSURIZED (MISCELLANEOUS) IMPLANT
WIRE EMERALD 3MM-J .035X150CM (WIRE) ×1 IMPLANT

## 2022-03-08 NOTE — H&P (Signed)
Cardiology Admission History and Physical:   Patient ID: Bobby [name] MRN: [id number]; DOB: 18-Jul-1946   Admission date: 03/08/2022  PCP:  Hayden Rasmussen, MD   [facility] Providers Cardiologist:  Shelva Majestic, MD        Chief Complaint: Syncope  Patient Profile:   Bobby [name] is a 76 y.o. male with CAD and ischemic cardiomyopathy/CHF who is being seen 03/08/2022 for the evaluation of syncope due to high-grade AV block.  History of Present Illness:   Bobby [name] presents with complaints of dizziness and lightheadedness and at least 1 syncopal event while alone at home.  He thinks he was only unconscious for a few seconds but is not sure.  He did not fall or have an injury.  He was initially evaluated in his PCP office and was referred to the emergency room due to bradycardia he presents to the emergency room with a bradycardia and mostly is in sinus rhythm with 2: 1 AV block and a ventricular rate of around 40 bpm.  At least on 1 occasion he developed transient complete heart block with idioventricular escape rhythm at about 27 bpm.  He denies any recent problems with chest pain and his shortness of breath is at baseline NYHA functional class II.  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  He initially presented with inferior ST segment elevation myocardial infarction in December 2013 when he underwent PCI-RCA with a drug-eluting stent.  He had normal left ventricular systolic function at the time.  In December 2018 he had an anterior STEMI with occlusion of the mid LAD undergoing PCI-drug-eluting stent and subsequently underwent staged interventions to the 99% dominant mid left circumflex stenosis and 80% stenosis in the OM1.  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  LVEF has remained roughly in the same range on multiple studies since then.  Recent echo  from January 2023 shows EF 25-30% with aneurysmal changes of the inferoapical septum and no evidence of thrombus.  This last echo was performed during admission for community-acquired pneumonia with acute hypoxic/hypercapnic respiratory failure requiring intubation, in January 2023.  He has not required hospitalization for congestive heart failure.  He has been on treatment with carvedilol 25 mg twice daily in addition to angiotensin receptor blocker  He has smoked a pack a day since age 37 for a total of over 100 pack years history of smoking.  He continues to smoke.  He has been on anticoagulation with Eliquis (initially on warfarin) for LV thrombus seen on echo from 2020, but not on subsequent echocardiograms..  He has diabetes mellitus.  He has history of obstructive sleep apnea on CPAP with less than perfect compliance and is currently not using CPAP at all.  He has moderate chronic kidney disease with episodes of acute kidney injury when his creatinine has increased to as much as 2.38 in the past, is 2.75 today, but his baseline creatinine seems to be 1.9-2.0.   Past Medical History:  Diagnosis Date   DM (diabetes mellitus), type 2 new diagnosis 08/05/2012   diet control   ED (erectile dysfunction)    surgery planned   Hyperlipidemia 08/05/2012   Hypertensive heart disease    Panic attack    Pulmonary HTN ([place])    echo 08/05/12, EF 55-60%, PA pressure 58mm   S/P coronary artery stent placement, to RCA Promus DES 08/05/2012  Stroke [facility])    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   Tobacco abuse     Past Surgical History:  Procedure Laterality Date   CARDIAC CATHETERIZATION  08/04/12   PCI to RCA with DES   CORONARY STENT INTERVENTION N/A 08/12/2017   Procedure: CORONARY STENT INTERVENTION;  Surgeon: Martinique, Peter M, MD;  Location: [place] CV [facility];  Service: Cardiovascular;  Laterality: N/A;   CORONARY/GRAFT ACUTE MI REVASCULARIZATION N/A 08/09/2017   Procedure: Coronary/Graft Acute MI  Revascularization;  Surgeon: Belva Crome, MD;  Location: [place] [facility];  Service: Cardiovascular;  Laterality: N/A;   LEFT HEART CATH AND CORONARY ANGIOGRAPHY N/A 08/09/2017   Procedure: LEFT HEART CATH AND CORONARY ANGIOGRAPHY;  Surgeon: Belva Crome, MD;  Location: [place] [facility];  Service: Cardiovascular;  Laterality: N/A;   LEFT HEART CATHETERIZATION WITH CORONARY ANGIOGRAM N/A 08/04/2012   Procedure: LEFT HEART CATHETERIZATION WITH CORONARY ANGIOGRAM;  Surgeon: Troy Sine, MD;  Location: [facility] CATH LAB;  Service: Cardiovascular;  Laterality: N/A;   LEG SURGERY Left    rod placed for fracture repair   PENILE PROSTHESIS IMPLANT N/A 06/03/2014   Procedure: IMPLANT PENILE PROTHESIS INFLATABLE;  Surgeon: Ailene Rud, MD;  Location: [facility] OR[facility];  Service: Urology;  Laterality: N/A;  with penile block--0.5% marcaine plain   PERCUTANEOUS CORONARY STENT INTERVENTION (PCI-S) Right 08/04/2012   Procedure: PERCUTANEOUS CORONARY STENT INTERVENTION (PCI-S);  Surgeon: Troy Sine, MD;  Location: [facility] CATH LAB;  Service: Cardiovascular;  Laterality: Right;   THUMB ARTHROSCOPY Left    thumb joint replaced     Medications Prior to Admission: Prior to Admission medications   Medication Sig Start Date End Date Taking? Authorizing Provider  albuterol (PROVENTIL HFA;VENTOLIN HFA) 108 (90 Base) MCG/ACT inhaler Inhale 2 puffs into the lungs every 6 (six) hours as needed for wheezing or shortness of breath. 08/21/17   Bhagat, Crista Luria, PA  albuterol (PROVENTIL) (2.5 MG/3ML) 0.083% nebulizer solution Take 5 mg by nebulization every 4 (four) hours as needed. For wheezing or SOB    [provider]  amLODipine (NORVASC) 5 MG tablet TAKE ONE TABLET BY MOUTH DAILY Patient taking differently: Take 5 mg by mouth daily. 07/04/21   Troy Sine, MD  apixaban (ELIQUIS) 5 MG TABS tablet Take 1 tablet (5 mg total) by mouth 2 (two) times daily. 02/19/19   Troy Sine, MD  aspirin EC 81 MG  tablet Take 1 tablet (81 mg total) by mouth daily. Swallow whole. Patient taking differently: Take 81 mg by mouth at bedtime. Swallow whole. 11/09/20   Erlene Quan, PA-C  atorvastatin (LIPITOR) 80 MG tablet Take 1 tablet (80 mg total) by mouth daily at 6 PM. 03/11/18   Troy Sine, MD  BREZTRI AEROSPHERE 160-9-4.8 MCG/ACT AERO Inhale 2 puffs into the lungs 2 (two) times daily. 01/06/20   [provider]  buPROPion (WELLBUTRIN XL) 300 MG 24 hr tablet Take 300 mg by mouth daily.    [provider]  carvedilol (COREG) 25 MG tablet TAKE ONE TABLET BY MOUTH TWICE DAILY Patient taking differently: Take 25 mg by mouth 2 (two) times daily with a meal. 07/04/21   Troy Sine, MD  Coenzyme Q10 (COQ-10) 100 MG CAPS Take 100 mg by mouth daily.    [provider]  COLCRYS 0.6 MG tablet Take 0.6 mg by mouth daily. 03/21/17   [provider]  Fexofenadine HCl (MUCINEX ALLERGY PO) Take 1 tablet by mouth every 6 (six) hours as  needed (cough).    [provider]  furosemide (LASIX) 40 MG tablet Take 40 mg by mouth daily.    [provider]  hydrALAZINE (APRESOLINE) 10 MG tablet TAKE ONE TABLET BY MOUTH TWICE DAILY 01/25/22   Troy Sine, MD  nitroGLYCERIN (NITROSTAT) 0.4 MG SL tablet DISSOLVE 1 TABLET UNDER THE TONGUE EVERY 5 MINUTES AS NEEDED FOR CHEST PAIN. DO NOT EXCEED A TOTAL OF 3 DOSES IN 15 MINUTES. Patient taking differently: Place 0.4 mg under the tongue every 5 (five) minutes as needed. 07/04/21   Troy Sine, MD  Omega-3 Fatty Acids (FISH OIL PO) Take 1 tablet by mouth daily.    [provider]  oxyCODONE-acetaminophen (PERCOCET) 10-325 MG per tablet Take 1 tablet by mouth every 6 (six) hours as needed for pain.    [provider]  predniSONE (DELTASONE) 10 MG tablet Take 10 mg by mouth daily. 11/05/20   [provider]  sertraline (ZOLOFT) 100 MG tablet Take 100 mg by mouth daily.    [provider]      Allergies:    Allergies  Allergen Reactions   Hydrocodone Itching    Social History:   Social History   Socioeconomic History   Marital status: Legally Separated    Spouse name: Not on file   Number of children: Not on file   Years of education: Not on file   Highest education level: Not on file  Occupational History   Not on file  Tobacco Use   Smoking status: Every Day    Packs/day: 0.75    Years: 60.00    Total pack years: 45.00    Types: Cigarettes   Smokeless tobacco: Never   Tobacco comments:    Currently smoking about .5ppd as of 06/14/21 ep  Substance and Sexual Activity   Alcohol use: Not Currently    Comment: ocasionally   Drug use: No   Sexual activity: Not on file  Other Topics Concern   Not on file  Social History Narrative   Not on file   Social Determinants of Health   Financial Resource Strain: Not on file  Food Insecurity: Not on file  Transportation Needs: Not on file  Physical Activity: Not on file  Stress: Not on file  Social Connections: Not on file  Intimate Partner Violence: Not on file    Family History:   The patient's family history includes Heart attack in his paternal grandmother; Heart disease in his maternal grandmother; Kidney disease in his father; Stomach cancer in his maternal grandfather.    ROS:  Please see the history of present illness.  All other ROS reviewed and negative.     Physical Exam/Data:   Vitals:   03/08/22 1302 03/08/22 1315 03/08/22 1345 03/08/22 1447  BP: (!) 136/49 137/62 (!) 150/72 (!) 140/56  Pulse:  (!) 34 (!) 35 (!) 35  Resp:  (!) 21 15 18   Temp:      TempSrc:      SpO2:  95% 97% 93%  Weight:      Height:       No intake or output data in the 24 hours ending 03/08/22 1459    03/08/2022    1:00 PM 01/09/2022    9:25 AM 09/11/2021    4:01 AM  Last 3 Weights  Weight (lbs) 243 lb 243 lb 9.6 oz 249 lb 1.9 oz  Weight (kg) 110.224 kg 110.496 kg 113 kg     Body mass index  is 35.88 kg/m.   General:  Well nourished, well developed, in no acute distress obese HEENT: normal Neck: Unable to evaluate JVD Vascular: No carotid bruits; Distal pulses 2+ bilaterally   Cardiac:  normal S1, widely split S2; RRR; no diastolic murmur 2/6 systolic ejection murmur heard best at the left lower sternal border Lungs:  clear to auscultation bilaterally, no wheezing, rhonchi or rales  Abd: soft, nontender, no hepatomegaly  Ext: no edema Musculoskeletal:  No deformities, BUE and BLE strength normal and equal Skin: warm and dry  Neuro:  CNs 2-12 intact, no focal abnormalities noted Psych:  Normal affect    EKG:  The ECG that was done in the emergency room was personally reviewed and demonstrates sinus rhythm with 2: 1 AV block, old RBBB and LAFB.  Telemetry while in the emergency room shows a period of complete heart block with idioventricular escape rhythm at around 26 bpm.  Relevant CV Studies: Echocardiogram 09/07/2021  Laboratory Data:  High Sensitivity Troponin:   Recent Labs  Lab 03/08/22 1305  TROPONINIHS 29*      Chemistry Recent Labs  Lab 03/08/22 1305  NA 138  K 4.0  CL 110  CO2 20*  GLUCOSE 144*  BUN 45*  CREATININE 2.75*  CALCIUM 9.3  MG 2.0  GFRNONAA 23*  ANIONGAP 8    Recent Labs  Lab 03/08/22 1305  PROT 6.2*  ALBUMIN 3.6  AST 20  ALT 31  ALKPHOS 89  BILITOT 0.7   Lipids No results for input(s): "CHOL", "TRIG", "HDL", "LABVLDL", "LDLCALC", "CHOLHDL" in the last 168 hours. Hematology Recent Labs  Lab 03/08/22 1305  WBC 8.3  RBC 4.04*  HGB 11.6*  HCT 36.9*  MCV 91.3  MCH 28.7  MCHC 31.4  RDW 15.0  PLT 140*   Thyroid  Recent Labs  Lab 03/08/22 1305 03/08/22 1330  TSH  --  1.130  FREET4 0.88  --    BNP Recent Labs  Lab 03/08/22 1305  BNP 789.1*    DDimer No results for input(s): "DDIMER" in the last 168 hours.   Radiology/Studies:  No results found.   Assessment and Plan:   Complete heart block: He presents today with  persistent 2: 1 AV block due to Mobitz type II second-degree AV block and brief periods of complete heart block with idioventricular escape rhythm at about 26 bpm.  He is symptomatic and has evidence of low cardiac output based on labs.  He had at least 1 episode of syncope.  He does not have any symptoms of acute coronary insufficiency or heart failure exacerbation at this time.   He has chronic evidence of conduction system disease (bifascicular block dates back for several years).  Initially thought we could manage this conservatively while we wait for carvedilol to dissipate, but he keeps having episodes of CHB that are symptomatic. We will plan to place a temporary transvenous pacemaker. This procedure has been fully reviewed with the patient and written informed consent has been obtained.  Hold carvedilol.  If he does not recover AV conduction fairly quickly, he should receive a dual-chamber permanent pacemaker once the effects of anticoagulation have subsided.  Note normal thyroid function CAD: History of previous inferior and anterior ST segment elevation myocardial infarction's.  He has had stents placed in all 3 major coronary arteries.  He has not had a percutaneous intervention since 2018.  Currently angina free. History of left ventricular thrombus: This has not been seen on his most recent echocardiograms in  the last couple of years.  He has been receiving Eliquis anticoagulation. CHF: Combined systolic and diastolic.  EF estimation has very little bit over the years but for the most part LVEF has been around 30%, primarily due to extensive wall motion abnormalities in the distribution of the LAD artery following his STEMI in 2018.  Appears compensated at this time from a clinical point of view and BNP is comparable to his baseline level.  He has been on high-dose carvedilol which is currently on hold but which can be restarted after we place a permanent pacemaker.  Previously on ARB, this has been  stopped due to worsening renal function.  He is on hydralazine in a very low dose.  Also on amlodipine (we should probably stop this and increase hydralazine/nitrates). 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 COPD: COPD exacerbation and pneumonia has more frequently been a cause of his hospitalization when compared with heart disease.  He continues to smoke cigarettes.  Chronically on triple inhaler therapy with breztri and low-dose systemic steroids (prednisone 10 mg daily). HLP: Continue atorvastatin 80 mg daily.  On this regimen LDL cholesterol has typically been around 60, although it was 77 on most recent labs from September 2022.  Target LDL less than 70.  We will recheck on this admission. History of gout: I am not sure whether he was taking colchicine as a daily medication or only as needed for gout exacerbation.  Watch for gout attack following this.  Of low cardiac output due to bradycardia and worsening kidney function.   Risk Assessment/Risk Scores:       New York Heart Association (NYHA) Functional Class NYHA Class II     Severity of Illness: The appropriate patient status for this patient is INPATIENT. Inpatient status is judged to be reasonable and necessary in order to provide the required intensity of service to ensure the patient's safety. The patient's presenting symptoms, physical exam findings, and initial radiographic and laboratory data in the context of their chronic comorbidities is felt to place them at high risk for further clinical deterioration. Furthermore, it is not anticipated that the patient will be medically stable for discharge from the hospital within 2 midnights of admission.   * I certify that at the point of admission it is my clinical judgment that the patient will require inpatient hospital care spanning beyond 2 midnights from the point of admission due to high  intensity of service, high risk for further deterioration and high frequency of surveillance required.*   For questions or updates, please contact [place] Please consult [email or web address] for contact info under     Signed, Sanda Klein, MD  03/08/2022 2:59 PM

## 2022-03-08 NOTE — ED Triage Notes (Addendum)
Pt BIBA from home. Pt recently dx with PNA. Pt c/o feeling swimmy-headed and increased SHOB. HR with EMS in 40s, second degree type 1 seen.  94% on 2L (wears at home) 173 FSBG 500 NS with EMS. 20g L hand

## 2022-03-08 NOTE — Progress Notes (Signed)
Placed patient on CPAP for the night via auto-mode with oxygen set at 8lpm.

## 2022-03-08 NOTE — ED Notes (Addendum)
Cards at bedside, aware of HR at 27

## 2022-03-08 NOTE — ED Provider Notes (Signed)
[facility] EMERGENCY DEPARTMENT Provider Note   CSN: [id number] Arrival date & time: 03/08/22  1257     History  Chief Complaint  Patient presents with   Bradycardia    Bobby [name] is a 76 y.o. male.  Patient arrives with bradycardia, lightheadedness, dizziness.  Patient with history of CAD, hypertension, high cholesterol.  Has been feeling some shortness of breath as well.  He has been feeling lightheaded and short of breath and dizzy at times over the last several days.  Went to his primary care doctor's office and they were concerned given his bradycardia.  He does take Coreg.  He is on blood thinners.  He has not had any falls.  He denies any chest pain.  He feels like he has some fluid buildup.  Was recently admitted for pneumonia.  Denies any fevers or chills.  The history is provided by the patient.       Home Medications Prior to Admission medications   Medication Sig Start Date End Date Taking? Authorizing Provider  albuterol (PROVENTIL HFA;VENTOLIN HFA) 108 (90 Base) MCG/ACT inhaler Inhale 2 puffs into the lungs every 6 (six) hours as needed for wheezing or shortness of breath. 08/21/17   Bhagat, Crista Luria, PA  albuterol (PROVENTIL) (2.5 MG/3ML) 0.083% nebulizer solution Take 5 mg by nebulization every 4 (four) hours as needed. For wheezing or SOB    [provider]  amLODipine (NORVASC) 5 MG tablet TAKE ONE TABLET BY MOUTH DAILY Patient taking differently: Take 5 mg by mouth daily. 07/04/21   Troy Sine, MD  apixaban (ELIQUIS) 5 MG TABS tablet Take 1 tablet (5 mg total) by mouth 2 (two) times daily. 02/19/19   Troy Sine, MD  aspirin EC 81 MG tablet Take 1 tablet (81 mg total) by mouth daily. Swallow whole. Patient taking differently: Take 81 mg by mouth at bedtime. Swallow whole. 11/09/20   Erlene Quan, PA-C  atorvastatin (LIPITOR) 80 MG tablet Take 1 tablet (80 mg total) by mouth daily at 6 PM. 03/11/18   Troy Sine, MD   BREZTRI AEROSPHERE 160-9-4.8 MCG/ACT AERO Inhale 2 puffs into the lungs 2 (two) times daily. 01/06/20   [provider]  buPROPion (WELLBUTRIN XL) 300 MG 24 hr tablet Take 300 mg by mouth daily.    [provider]  carvedilol (COREG) 25 MG tablet TAKE ONE TABLET BY MOUTH TWICE DAILY Patient taking differently: Take 25 mg by mouth 2 (two) times daily with a meal. 07/04/21   Troy Sine, MD  Coenzyme Q10 (COQ-10) 100 MG CAPS Take 100 mg by mouth daily.    [provider]  COLCRYS 0.6 MG tablet Take 0.6 mg by mouth daily. 03/21/17   [provider]  Fexofenadine HCl (MUCINEX ALLERGY PO) Take 1 tablet by mouth every 6 (six) hours as needed (cough).    [provider]  furosemide (LASIX) 40 MG tablet Take 40 mg by mouth daily.    [provider]  hydrALAZINE (APRESOLINE) 10 MG tablet TAKE ONE TABLET BY MOUTH TWICE DAILY 01/25/22   Troy Sine, MD  nitroGLYCERIN (NITROSTAT) 0.4 MG SL tablet DISSOLVE 1 TABLET UNDER THE TONGUE EVERY 5 MINUTES AS NEEDED FOR CHEST PAIN. DO NOT EXCEED A TOTAL OF 3 DOSES IN 15 MINUTES. Patient taking differently: Place 0.4 mg under the tongue every 5 (five) minutes as needed. 07/04/21   Troy Sine, MD  Omega-3 Fatty Acids (FISH OIL PO) Take 1 tablet  by mouth daily.    [provider]  oxyCODONE-acetaminophen (PERCOCET) 10-325 MG per tablet Take 1 tablet by mouth every 6 (six) hours as needed for pain.    [provider]  predniSONE (DELTASONE) 10 MG tablet Take 10 mg by mouth daily. 11/05/20   [provider]  sertraline (ZOLOFT) 100 MG tablet Take 100 mg by mouth daily.    [provider]      Allergies    Hydrocodone    Review of Systems   Review of Systems  Physical Exam Updated Vital Signs BP (!) 150/72   Pulse (!) 35   Temp (!) 97.5 F (36.4 C) (Oral)   Resp 15   Ht 5\' 9"  (1.753 m)   Wt 110.2 kg   SpO2 97%   BMI 35.88 kg/m  Physical Exam Vitals and nursing  note reviewed.  Constitutional:      General: He is not in acute distress.    Appearance: He is well-developed. He is not ill-appearing.  HENT:     Head: Normocephalic and atraumatic.     Nose: Nose normal.     Mouth/Throat:     Mouth: Mucous membranes are moist.  Eyes:     Extraocular Movements: Extraocular movements intact.     Conjunctiva/sclera: Conjunctivae normal.     Pupils: Pupils are equal, round, and reactive to light.  Cardiovascular:     Rate and Rhythm: Regular rhythm. Bradycardia present.     Pulses: Normal pulses.     Heart sounds: No murmur heard. Pulmonary:     Effort: Pulmonary effort is normal. No respiratory distress.     Breath sounds: Normal breath sounds.  Abdominal:     Palpations: Abdomen is soft.     Tenderness: There is no abdominal tenderness.  Musculoskeletal:        General: No swelling.     Cervical back: Normal range of motion and neck supple.     Right lower leg: Edema (trace) present.     Left lower leg: Edema (trace) present.  Skin:    General: Skin is warm and dry.     Capillary Refill: Capillary refill takes less than 2 seconds.  Neurological:     General: No focal deficit present.     Mental Status: He is alert.  Psychiatric:        Mood and Affect: Mood normal.     ED Results / Procedures / Treatments   Labs (all labs ordered are listed, but only abnormal results are displayed) Labs Reviewed  CBC WITH DIFFERENTIAL/PLATELET - Abnormal; Notable for the following components:      Result Value   RBC 4.04 (*)    Hemoglobin 11.6 (*)    HCT 36.9 (*)    Platelets 140 (*)    All other components within normal limits  COMPREHENSIVE METABOLIC PANEL  BRAIN NATRIURETIC PEPTIDE  MAGNESIUM  TSH  T4, FREE  TROPONIN I (HIGH SENSITIVITY)    EKG EKG Interpretation  Date/Time:  Thursday March 08 2022 13:37:10 EDT Ventricular Rate:  35 PR Interval:  185 QRS Duration: 150 QT Interval:  698 QTC Calculation: 533 R Axis:   -32 Text  Interpretation: AV Block Confirmed by Lennice Sites ([id number]) on 03/08/2022 1:55:35 PM  Radiology No results found.  Procedures .Critical Care  Performed by: Lennice Sites, DO Authorized by: Lennice Sites, DO   Critical care provider statement:    Critical care time (minutes):  35   Critical care was necessary to  treat or prevent imminent or life-threatening deterioration of the following conditions:  Cardiac failure   Critical care was time spent personally by me on the following activities:  Blood draw for specimens, development of treatment plan with patient or surrogate, evaluation of patient's response to treatment, discussions with primary provider, examination of patient, obtaining history from patient or surrogate, ordering and performing treatments and interventions, ordering and review of laboratory studies, pulse oximetry, re-evaluation of patient's condition, review of old charts and ordering and review of radiographic studies   Care discussed with: admitting provider       Medications Ordered in ED Medications  aspirin EC tablet 81 mg (has no administration in time range)  atorvastatin (LIPITOR) tablet 80 mg (has no administration in time range)  buPROPion (WELLBUTRIN XL) 24 hr tablet 300 mg (has no administration in time range)  sertraline (ZOLOFT) tablet 100 mg (has no administration in time range)  predniSONE (DELTASONE) tablet 10 mg (has no administration in time range)  Budeson-Glycopyrrol-Formoterol 160-9-4.8 MCG/ACT AERO 2 puff (has no administration in time range)  acetaminophen (TYLENOL) tablet 650 mg (has no administration in time range)  ondansetron (ZOFRAN) injection 4 mg (has no administration in time range)    ED Course/ Medical Decision Making/ A&P                           Medical Decision Making Amount and/or Complexity of Data Reviewed Labs: ordered. Radiology: ordered.  Risk Decision regarding hospitalization.   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  Went to primary care doctor office today for symptoms.  Was found to be bradycardic and sent here.  Upon review and interpretation of EKG here by myself appears to have AV block.  He is on Coreg.  History of CAD, hypertension, high cholesterol.  He is on blood thinner.  Heart rate is in the 30s.  Talked with Dr. Sallyanne Kuster, with cardiology and he is in agreement that this is likely due to 1 AV block.  They will come down to the ED to evaluate and admit.  Suspect this could be from [place].  We will allow this to washout but he may need a pacemaker.  He otherwise has normal vitals.  He has been short of breath during this time.  Suspect may be some fluid overload.  We will get CBC, CMP, troponin, magnesium, thyroid studies, BNP, chest x-ray.  Per my review and interpretation of labs no significant anemia or leukocytosis.  Patient seen by cardiology and will admit for further care.  Troponin, BNP, chest x-ray still pending at time of handoff to cardiology team.  Hemodynamically stable, heart block with heart rate in the 30 range.  This chart was dictated using voice recognition software.  Despite best efforts to proofread,  errors can occur which can change the documentation meaning.         Final Clinical Impression(s) / ED Diagnoses Final diagnoses:  Symptomatic bradycardia  AV block    Rx / DC Orders ED Discharge Orders     None         Lennice Sites, DO 03/08/22 1415

## 2022-03-08 NOTE — CV Procedure (Addendum)
Successful right femoral vein access using real-time vascular ultrasound.  25 cm 6 French sheath inserted using modified Seldinger technique.  Bipolar balloontipped temporary pacemaker floated to the RV apex successfully under fluoroscopic guidance.  Threshold less than 1 mA.  Pacer set at 5 mA output with backup rate of 50 bpm and demand mode. No complications to this point. No sedation required

## 2022-03-09 ENCOUNTER — Inpatient Hospital Stay (HOSPITAL_COMMUNITY): Payer: Medicare Other

## 2022-03-09 ENCOUNTER — Other Ambulatory Visit (HOSPITAL_COMMUNITY): Payer: Self-pay

## 2022-03-09 ENCOUNTER — Encounter (HOSPITAL_COMMUNITY): Payer: Self-pay | Admitting: Interventional Cardiology

## 2022-03-09 ENCOUNTER — Telehealth (HOSPITAL_COMMUNITY): Payer: Self-pay | Admitting: Pharmacy Technician

## 2022-03-09 DIAGNOSIS — I441 Atrioventricular block, second degree: Secondary | ICD-10-CM

## 2022-03-09 LAB — CBC
HCT: 38.8 % — ABNORMAL LOW (ref 39.0–52.0)
Hemoglobin: 12.5 g/dL — ABNORMAL LOW (ref 13.0–17.0)
MCH: 28.7 pg (ref 26.0–34.0)
MCHC: 32.2 g/dL (ref 30.0–36.0)
MCV: 89 fL (ref 80.0–100.0)
Platelets: 152 10*3/uL (ref 150–400)
RBC: 4.36 MIL/uL (ref 4.22–5.81)
RDW: 15 % (ref 11.5–15.5)
WBC: 10.1 10*3/uL (ref 4.0–10.5)
nRBC: 0 % (ref 0.0–0.2)

## 2022-03-09 LAB — BASIC METABOLIC PANEL
Anion gap: 14 (ref 5–15)
BUN: 42 mg/dL — ABNORMAL HIGH (ref 8–23)
CO2: 18 mmol/L — ABNORMAL LOW (ref 22–32)
Calcium: 10 mg/dL (ref 8.9–10.3)
Chloride: 111 mmol/L (ref 98–111)
Creatinine, Ser: 2.47 mg/dL — ABNORMAL HIGH (ref 0.61–1.24)
GFR, Estimated: 26 mL/min — ABNORMAL LOW (ref >60)
Glucose, Bld: 117 mg/dL — ABNORMAL HIGH (ref 70–99)
Potassium: 4 mmol/L (ref 3.5–5.1)
Sodium: 143 mmol/L (ref 135–145)

## 2022-03-09 LAB — LIPID PANEL
Cholesterol: 123 mg/dL (ref 0–200)
HDL: 39 mg/dL — ABNORMAL LOW (ref >40)
LDL Cholesterol: 66 mg/dL (ref 0–99)
Total CHOL/HDL Ratio: 3.2 RATIO
Triglycerides: 89 mg/dL (ref <150)
VLDL: 18 mg/dL (ref 0–40)

## 2022-03-09 LAB — ECHOCARDIOGRAM COMPLETE
Height: 69 in
S' Lateral: 4.3 cm
Single Plane A4C EF: 36.6 %
Weight: 3888 oz

## 2022-03-09 MED ORDER — CLONAZEPAM 0.5 MG PO TABS
1.0000 mg | ORAL_TABLET | Freq: Two times a day (BID) | ORAL | Status: DC | PRN
Start: 2022-03-09 — End: 2022-03-12
  Administered 2022-03-09 – 2022-03-10 (×2): 1 mg via ORAL
  Filled 2022-03-09 (×2): qty 1

## 2022-03-09 MED ORDER — PERFLUTREN LIPID MICROSPHERE
1.0000 mL | INTRAVENOUS | Status: AC | PRN
  Administered 2022-03-09: 2 mL via INTRAVENOUS

## 2022-03-09 MED ORDER — FUROSEMIDE 10 MG/ML IJ SOLN
40.0000 mg | Freq: Every day | INTRAMUSCULAR | Status: DC
  Administered 2022-03-09: 40 mg via INTRAVENOUS
  Filled 2022-03-09: qty 4

## 2022-03-09 MED ORDER — SODIUM CHLORIDE 0.9% FLUSH
3.0000 mL | Freq: Two times a day (BID) | INTRAVENOUS | Status: DC
  Administered 2022-03-09 – 2022-03-10 (×2): 3 mL via INTRAVENOUS
  Administered 2022-03-10: 10 mL via INTRAVENOUS
  Administered 2022-03-11 – 2022-03-12 (×2): 3 mL via INTRAVENOUS

## 2022-03-09 MED ORDER — OXYCODONE HCL 5 MG PO TABS
10.0000 mg | ORAL_TABLET | Freq: Four times a day (QID) | ORAL | Status: DC | PRN
  Administered 2022-03-11 – 2022-03-12 (×3): 10 mg via ORAL
  Filled 2022-03-09 (×3): qty 2

## 2022-03-09 MED ORDER — OXYCODONE-ACETAMINOPHEN 10-325 MG PO TABS: 1.0000 | ORAL_TABLET | Freq: Four times a day (QID) | ORAL | Status: DC | PRN

## 2022-03-09 MED ORDER — ALBUTEROL SULFATE (2.5 MG/3ML) 0.083% IN NEBU: 2.5000 mg | INHALATION_SOLUTION | Freq: Four times a day (QID) | RESPIRATORY_TRACT | Status: DC | PRN

## 2022-03-09 MED ORDER — ALBUTEROL SULFATE HFA 108 (90 BASE) MCG/ACT IN AERS: 2.0000 | INHALATION_SPRAY | Freq: Four times a day (QID) | RESPIRATORY_TRACT | Status: DC | PRN

## 2022-03-09 MED ORDER — FUROSEMIDE 40 MG PO TABS
40.0000 mg | ORAL_TABLET | Freq: Every day | ORAL | Status: DC
  Administered 2022-03-10: 40 mg via ORAL
  Filled 2022-03-09: qty 1

## 2022-03-09 MED ORDER — ACETAMINOPHEN 325 MG PO TABS: 325.0000 mg | ORAL_TABLET | Freq: Four times a day (QID) | ORAL | Status: DC | PRN

## 2022-03-09 MED FILL — Heparin Sod (Porcine)-NaCl IV Soln 1000 Unit/500ML-0.9%: INTRAVENOUS | Qty: 500 | Status: AC

## 2022-03-09 MED FILL — Lidocaine HCl Local Preservative Free (PF) Inj 1%: INTRAMUSCULAR | Qty: 30 | Status: AC

## 2022-03-09 NOTE — Consult Note (Addendum)
Cardiology Consultation:   Patient ID: Bobby [name] MRN: [id number]; DOB: 03-21-1946  Admit date: 03/08/2022 Date of Consult: 03/09/2022  PCP:  Hayden Rasmussen, MD   [facility] Providers Cardiologist:  Shelva Majestic, MD   {   Patient Profile:   Bobby [name] is a 76 y.o. 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 Sallyanne Kuster.  History of Present Illness:   [name] recent reports of dizziness and one brief syncopal spell, sought attention at his PMD who found him bradycardic and referred to the ER, initially in a stable 2:1 AVBlock though with sleep some CHB as well into the 20's  Cardiology team called, he was admitted and his home coreg stopped, the patient did eventually develop more persistent CHB symptomatic and temp pacing wire placed yesterday afternoon.  EP is asked to see him.  LABS K+ 4.0 > 4.0 BUN/Creat 45/2.75 > 42/2.47 (baseline probably about 2) Mag 2.0 HS Trop 29, 28 BNP 789 WBC 10.1 H/H 12/38 Plts 152 TSH 1.130  Home meds reviewed No other nodal blocking agents noted  He denies any unusual symptoms until yesterday, last week had a very slight and fleeting far lateral L chest pain He denies any syncope to me, but felt very weak and lightheaded at home Denies SOB  He has chronic back pain and is requesting his home percocet, was a roofer for many years  Past Medical History:  Diagnosis Date   DM (diabetes mellitus), type 2 new diagnosis 08/05/2012   diet control   ED (erectile dysfunction)    surgery planned   Hyperlipidemia 08/05/2012   Hypertensive heart disease    Panic attack    Pulmonary HTN ([place])    echo 08/05/12, EF 55-60%, PA pressure 46mm   S/P coronary  artery stent placement, to RCA Promus DES 08/05/2012   Stroke ([place])    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   Tobacco abuse     Past Surgical History:  Procedure Laterality Date   CARDIAC CATHETERIZATION  08/04/12   PCI to RCA with DES   CORONARY STENT INTERVENTION N/A 08/12/2017   Procedure: CORONARY STENT INTERVENTION;  Surgeon: Martinique, Peter M, MD;  Location: [place] [facility];  Service: Cardiovascular;  Laterality: N/A;   CORONARY/GRAFT ACUTE MI REVASCULARIZATION N/A 08/09/2017   Procedure: Coronary/Graft Acute MI Revascularization;  Surgeon: Belva Crome, MD;  Location: [place] [facility];  Service: Cardiovascular;  Laterality: N/A;   LEFT HEART CATH AND CORONARY ANGIOGRAPHY N/A 08/09/2017   Procedure: LEFT HEART CATH AND CORONARY ANGIOGRAPHY;  Surgeon: Belva Crome, MD;  Location: [place] [facility];  Service: Cardiovascular;  Laterality: N/A;   LEFT HEART CATHETERIZATION WITH CORONARY ANGIOGRAM N/A 08/04/2012   Procedure: LEFT HEART CATHETERIZATION WITH CORONARY ANGIOGRAM;  Surgeon: Troy Sine, MD;  Location: [facility] CATH LAB;  Service: Cardiovascular;  Laterality: N/A;   LEG SURGERY Left    rod placed for fracture repair   PENILE PROSTHESIS IMPLANT N/A 06/03/2014   Procedure: IMPLANT PENILE PROTHESIS INFLATABLE;  Surgeon: Ailene Rud, MD;  Location: [facility];  Service: Urology;  Laterality: N/A;  with penile block--0.5% marcaine plain   PERCUTANEOUS CORONARY STENT INTERVENTION (PCI-S)  Right 08/04/2012   Procedure: PERCUTANEOUS CORONARY STENT INTERVENTION (PCI-S);  Surgeon: Troy Sine, MD;  Location: [facility] CATH LAB;  Service: Cardiovascular;  Laterality: Right;   TEMPORARY PACEMAKER N/A 03/08/2022   Procedure: TEMPORARY PACEMAKER;  Surgeon: Belva Crome, MD;  Location: [place] [facility];  Service: Cardiovascular;  Laterality: N/A;   THUMB ARTHROSCOPY Left    thumb joint replaced     Home Medications:  Prior to Admission medications   Medication Sig Start Date End Date Taking?  Authorizing Provider  albuterol (PROVENTIL HFA;VENTOLIN HFA) 108 (90 Base) MCG/ACT inhaler Inhale 2 puffs into the lungs every 6 (six) hours as needed for wheezing or shortness of breath. 08/21/17  Yes Bhagat, Bhavinkumar, PA  amLODipine (NORVASC) 5 MG tablet TAKE ONE TABLET BY MOUTH DAILY Patient taking differently: Take 5 mg by mouth daily. 07/04/21  Yes Troy Sine, MD  apixaban (ELIQUIS) 5 MG TABS tablet Take 1 tablet (5 mg total) by mouth 2 (two) times daily. 02/19/19  Yes Troy Sine, MD  aspirin EC 81 MG tablet Take 1 tablet (81 mg total) by mouth daily. Swallow whole. Patient taking differently: Take 81 mg by mouth at bedtime. 11/09/20  Yes Kilroy, Luke K, PA-C  atorvastatin (LIPITOR) 80 MG tablet Take 1 tablet (80 mg total) by mouth daily at 6 PM. 03/11/18  Yes Troy Sine, MD  BREZTRI AEROSPHERE 160-9-4.8 MCG/ACT AERO Inhale 2 puffs into the lungs 2 (two) times daily. 01/06/20  Yes [provider]  buPROPion (WELLBUTRIN XL) 300 MG 24 hr tablet Take 300 mg by mouth daily.   Yes [provider]  carvedilol (COREG) 25 MG tablet TAKE ONE TABLET BY MOUTH TWICE DAILY Patient taking differently: Take 25 mg by mouth 2 (two) times daily with a meal. 07/04/21  Yes Troy Sine, MD  clonazePAM (KLONOPIN) 1 MG tablet Take 1 mg by mouth 2 (two) times daily as needed for anxiety. 02/10/22  Yes [provider]  Coenzyme Q10 (COQ10 PO) Take 1 capsule by mouth daily.   Yes [provider]  COLCRYS 0.6 MG tablet Take 0.6 mg by mouth daily. 03/21/17  Yes [provider]  ferrous sulfate 325 (65 FE) MG EC tablet Take 325 mg by mouth 2 (two) times a week.   Yes [provider]  furosemide (LASIX) 40 MG tablet Take 40 mg by mouth daily.   Yes [provider]  hydrALAZINE (APRESOLINE) 10 MG tablet TAKE ONE TABLET BY MOUTH TWICE DAILY Patient taking differently: Take 10 mg by mouth 2 (two) times daily. 01/25/22  Yes Troy Sine, MD   nitroGLYCERIN (NITROSTAT) 0.4 MG SL tablet DISSOLVE 1 TABLET UNDER THE TONGUE EVERY 5 MINUTES AS NEEDED FOR CHEST PAIN. DO NOT EXCEED A TOTAL OF 3 DOSES IN 15 MINUTES. Patient taking differently: Place 0.4 mg under the tongue every 5 (five) minutes x 3 doses as needed for chest pain. 07/04/21  Yes Troy Sine, MD  Omega-3 Fatty Acids (FISH OIL PO) Take 1 tablet by mouth daily.   Yes [provider]  oxyCODONE-acetaminophen (PERCOCET) 10-325 MG per tablet Take 1 tablet by mouth every 6 (six) hours as needed for pain.   Yes [provider]  predniSONE (DELTASONE) 10 MG tablet Take 10 mg by mouth daily.   Yes [provider]  sertraline (ZOLOFT) 100 MG tablet Take 100 mg by mouth daily.   Yes [provider]  Vitamin D, Ergocalciferol, (DRISDOL) 1.25 MG (50000 UNIT)  CAPS capsule Take 50,000 Units by mouth 2 (two) times a week. 01/06/22  Yes [provider]  doxycycline (VIBRAMYCIN) 100 MG capsule Take 100 mg by mouth 2 (two) times daily. Patient not taking: Reported on 03/09/2022 03/08/22   [provider]    Inpatient Medications: Scheduled Meds:  aspirin EC  81 mg Oral Daily   atorvastatin  80 mg Oral q1800   buPROPion  300 mg Oral Daily   Chlorhexidine Gluconate Cloth  6 each Topical Daily   fluticasone furoate-vilanterol  1 puff Inhalation Daily   And   umeclidinium bromide  1 puff Inhalation Daily   predniSONE  10 mg Oral Daily   sertraline  100 mg Oral Daily   sodium chloride flush  3 mL Intravenous Q12H   Continuous Infusions:  sodium chloride     sodium chloride 10 mL/hr at 03/09/22 0600   PRN Meds: sodium chloride, sodium chloride, acetaminophen, ondansetron (ZOFRAN) IV, mouth rinse, perflutren lipid microspheres (DEFINITY) IV suspension, sodium chloride flush  Allergies:    Allergies  Allergen Reactions   Hydrocodone Itching    Social History:   Social History   Socioeconomic History   Marital status: Legally  Separated    Spouse name: Not on file   Number of children: Not on file   Years of education: Not on file   Highest education level: Not on file  Occupational History   Not on file  Tobacco Use   Smoking status: Every Day    Packs/day: 0.75    Years: 60.00    Total pack years: 45.00    Types: Cigarettes   Smokeless tobacco: Never   Tobacco comments:    Currently smoking about .5ppd as of 06/14/21 ep  Substance and Sexual Activity   Alcohol use: Not Currently    Comment: ocasionally   Drug use: No   Sexual activity: Not on file  Other Topics Concern   Not on file  Social History Narrative   Not on file   Social Determinants of Health   Financial Resource Strain: Not on file  Food Insecurity: Not on file  Transportation Needs: Not on file  Physical Activity: Not on file  Stress: Not on file  Social Connections: Not on file  Intimate Partner Violence: Not on file    Family History:   Family History  Problem Relation Age of Onset   Kidney disease Father        kidney disease and heart disease   Heart disease Maternal Grandmother    Stomach cancer Maternal Grandfather    Heart attack Paternal Grandmother      ROS:  Please see the history of present illness.  All other ROS reviewed and negative.     Physical Exam/Data:   Vitals:   03/09/22 0500 03/09/22 0600 03/09/22 0700 03/09/22 0746  BP: (!) 141/77 (!) 145/74 140/85   Pulse: (!) 50 (!) 50 (!) 50   Resp: 18 18 18    Temp:    (!) 97 F (36.1 C)  TempSrc:    Oral  SpO2: 91% 92% 93%   Weight:      Height:        Intake/Output Summary (Last 24 hours) at 03/09/2022 1049 Last data filed at 03/09/2022 0600 Gross per 24 hour  Intake 715.55 ml  Output 3700 ml  Net -2984.45 ml      03/08/2022    1:00 PM 01/09/2022    9:25 AM 09/11/2021    4:01 AM  Last 3 Weights  Weight (lbs) 243 lb 243 lb 9.6 oz 249 lb 1.9 oz  Weight (kg) 110.224 kg 110.496 kg 113 kg     Body mass index is 35.88 kg/m.  General:  Well  nourished, well developed, in no acute distress HEENT: normal Neck: no JVD Vascular: No carotid bruits; Distal pulses 2+ bilaterally Cardiac:  RRR; paced, no murmurs, gallops or rubs Lungs:  CTA b/l, no wheezing, rhonchi or rales  Abd: soft, nontender, obese  Ext: no edema Musculoskeletal:  No deformities Skin: warm and dry  Neuro:  no focal abnormalities noted Psych:  Normal affect   EKG:  The EKG was personally reviewed and demonstrates:    2;1 AVblock 35bpm, RBBB  Asynchronous V pacing 50  Telemetry:  Telemetry was personally reviewed and demonstrates:   V pacing (underlying CHB)  Relevant CV Studies:  Echo is pending  09/07/21: TTE 1. Poor acoustic windows limit study, even with Definity use. LVEF is  severely depressed with akinesis of the distal 1/3 of ventricle Aneurysmal  dilitation of interventricular septum, distal inferior wall. No  significant change from echo of October  2022. Left ventricular ejection fraction, by estimation, is 25 to 30%. The  left ventricle has severely decreased function. The left ventricular  internal cavity size was moderately dilated. There is mild left  ventricular hypertrophy.   2. Right ventricular systolic function is normal. The right ventricular  size is normal.   3. Left atrial size was mildly dilated.   4. Right atrial size was mildly dilated.   5. Mild mitral valve regurgitation.   6. The aortic valve is tricuspid. Aortic valve regurgitation is not  visualized. Aortic valve sclerosis is present, with no evidence of aortic  valve stenosis.   7. The inferior vena cava dilated.   06/05/21: TTE 1. Septal and apical akinesis inferior wall hypokinesis overall EF  similar to TTE 02/16/20. Left ventricular ejection fraction, by estimation,  is 30 to 35%. The left ventricle has moderately decreased function. The  left ventricle has no regional wall  motion abnormalities. Left ventricular diastolic parameters are consistent  with  Grade I diastolic dysfunction (impaired relaxation).   2. Right ventricular systolic function is normal. The right ventricular  size is normal.   3. Left atrial size was moderately dilated.   4. Right atrial size was mildly dilated.   5. The mitral valve is abnormal. Mild mitral valve regurgitation. No  evidence of mitral stenosis.   6. The aortic valve is tricuspid. Aortic valve regurgitation is not  visualized. Mild to moderate aortic valve sclerosis/calcification is  present, without any evidence of aortic stenosis.   7. The inferior vena cava is normal in size with greater than 50%  respiratory variability, suggesting right atrial pressure of 3 mmHg.   02/15/20: LVEF 35-40% w/WMA 01/20/18: LVEF 35-40% w/WMA 08/09/17: LVEF 30-35%  08/12/2017: LHC/PCI Previously placed Prox LAD to Mid LAD stent (unknown type) is widely patent. Mid Cx lesion is 99% stenosed. A drug-eluting stent was successfully placed using a STENT SIERRA 3.00 X 28 MM. Post intervention, there is a 0% residual stenosis. Ost 1st Mrg to 1st Mrg lesion is 85% stenosed. A drug-eluting stent was successfully placed using a STENT RESOLUTE ONYX 3.0X18. Post intervention, there is a 0% residual stenosis.   1. Successful stenting of the mid LCx with a DES 2. Successful stenting of the first OM with DES    Laboratory Data:  High Sensitivity Troponin:   Recent Labs  Lab 03/08/22 1305 03/08/22 1512  TROPONINIHS 29* 28*     Chemistry Recent Labs  Lab 03/08/22 1305 03/09/22 0252  NA 138 143  K 4.0 4.0  CL 110 111  CO2 20* 18*  GLUCOSE 144* 117*  BUN 45* 42*  CREATININE 2.75* 2.47*  CALCIUM 9.3 10.0  MG 2.0  --   GFRNONAA 23* 26*  ANIONGAP 8 14    Recent Labs  Lab 03/08/22 1305  PROT 6.2*  ALBUMIN 3.6  AST 20  ALT 31  ALKPHOS 89  BILITOT 0.7   Lipids  Recent Labs  Lab 03/09/22 0252  CHOL 123  TRIG 89  HDL 39*  LDLCALC 66  CHOLHDL 3.2    Hematology Recent Labs  Lab 03/08/22 1305  03/09/22 0252  WBC 8.3 10.1  RBC 4.04* 4.36  HGB 11.6* 12.5*  HCT 36.9* 38.8*  MCV 91.3 89.0  MCH 28.7 28.7  MCHC 31.4 32.2  RDW 15.0 15.0  PLT 140* 152   Thyroid  Recent Labs  Lab 03/08/22 1305 03/08/22 1330  TSH  --  1.130  FREET4 0.88  --     BNP Recent Labs  Lab 03/08/22 1305  BNP 789.1*    DDimer No results for input(s): "DDIMER" in the last 168 hours.   Radiology/Studies:  DG Chest Portable 1 View Result Date: 03/08/2022 CLINICAL DATA:  ssob EXAM: PORTABLE CHEST 1 VIEW COMPARISON:  Chest x-ray 09/09/2021 FINDINGS: Interval removal of a left internal jugular central venous catheter. Enlarged cardiac silhouette with some component likely due to AP portable technique. The heart and mediastinal contours are unchanged. Aortic calcification. Prominent hilar vasculature. No focal consolidation. Increased markings. No pleural effusion. No pneumothorax. No acute osseous abnormality. IMPRESSION: Pulmonary edema. Electronically Signed   By: Iven Finn M.D.   On: 03/08/2022 16:39    Assessment and Plan:   CHB  Baseline conduction system disease with RBBB, LAD Coreg held on admission CHB persists (>24 hours after his last dose)  I have placed him on tomorrow's schedule Anticipate CRT-D implant and discussed with the patient rational for pacer/ICD  We discussed procedure, potential risks/benefits, he is agreeable to proceed.  2. CAD No CP Neg trops  3. ICM 4. Chronic CHF Updated echo is pending Echo from Jan done at the time of respiratory failure 2/2 pneumonia Doubt we Jaydenn Boccio see any significant improvement given longstanding CM Has some edema on his CXR and diminished at the bases, Jadier Rockers resume his home lasix  Denies SOB Daily BMET Meds held otherwise Would consider stopping norvasc for advancing GDMT though he has not had HF hospitalizations by record  5. Hx of LV thrombus Eliquis held for possible procedures  6. AKI on CKD  Not terribly far from his  baseline ARB held  7. Chronic back pain and opioid use at home 8. Anxiety Chiana Wamser resume his home PRN percocet and klonipin  Risk Assessment/Risk Scores:     For questions or updates, please contact [place] Please consult [email or web address] for contact info under    Signed, Baldwin Jamaica, PA-C  03/09/2022 10:49 AM  I have seen and examined this patient with Tommye Standard.  Agree with above, note added to reflect my findings.  Patient has a history of coronary artery disease with multiple stents, chronic systolic heart failure, CKD stage III, OSA, hypertension, hyperlipidemia, ongoing tobacco abuse.  He presented to the hospital with a brief syncopal spell and was found to have 2-1 AV block as well as complete  heart block.  He was admitted and had a temporary wire implanted.  He felt well up until the day of admission.  GEN: Well nourished, well developed, in no acute distress  HEENT: normal  Neck: no JVD, carotid bruits, or masses Cardiac: RRR; no murmurs, rubs, or gallops,no edema  Respiratory:  clear to auscultation bilaterally, normal work of breathing GI: soft, nontender, nondistended, + BS MS: no deformity or atrophy  Skin: warm and dry Neuro:  Strength and sensation are intact Psych: euthymic mood, full affect   Complete heart block: Status post temporary pacemaker wire.  Initially was on carvedilol but this is washed out and heart block remains.  He Halena Mohar need ICD therapy as he has a history of heart failure.  Due to the need for pacing, we Masai Kidd plan for CRT-D implant. Coronary artery disease: Status post multiple stents.  No current chest pain. Chronic systolic heart failure: Echo with an ejection fraction of 30 to 35%.  And for CRT-D implant as above.  We Kemba Hoppes restart carvedilol post implant. History of LV thrombus: Holding Eliquis for ICD implant Acute on chronic renal failure: Creatinine elevated.  Continuing to diurese.  Likely due to heart block and ventricular  pacing.  Nataley Bahri M. Sherlock Nancarrow MD 03/09/2022 5:31 PM

## 2022-03-09 NOTE — Telephone Encounter (Signed)
Pharmacy Patient Advocate Encounter  Insurance verification completed.    The patient is insured through [employer] Part D   The patient is currently admitted and ran test claims for the following: Farxiga 10 mg, Jardiance 10 mg.  Copays and coinsurance results were relayed to Inpatient clinical team.

## 2022-03-09 NOTE — TOC Benefit Eligibility Note (Signed)
Patient Teacher, English as a foreign language completed.    The patient is currently admitted and upon discharge could be taking Farxiga 10 mg.  The current 30 day co-pay is, $0.00.   The patient is currently admitted and upon discharge could be taking Jardiance 10 mg.  The current 30 day co-pay is, $0.00.   The patient is insured through [place] Patient Advocate Specialist [place] Patient Advocate Team Direct Number: [phone]  Fax: [phone]

## 2022-03-09 NOTE — Discharge Instructions (Signed)
    Supplemental Discharge Instructions for  Pacemaker/Defibrillator Patients   Activity No heavy lifting or vigorous activity with your left/right arm for 6 to 8 weeks.  Do not raise your left/right arm above your head for one week.  Gradually raise your affected arm as drawn below.             03/15/22                     03/16/22                    03/17/22                  03/18/22 __  NO DRIVING until cleared to at your wound check visit .  WOUND CARE Keep the wound area clean and dry.  Do not get this area wet , no showers until cleared to at your wound check visit . The tape/steri-strips on your wound will fall off; do not pull them off.  No bandage is needed on the site.  DO  NOT apply any creams, oils, or ointments to the wound area. If you notice any drainage or discharge from the wound, any swelling or bruising at the site, or you develop a fever > 101? F after you are discharged home, call the office at once.  Special Instructions You are still able to use cellular telephones; use the ear opposite the side where you have your pacemaker/defibrillator.  Avoid carrying your cellular phone near your device. When traveling through airports, show security personnel your identification card to avoid being screened in the metal detectors.  Ask the security personnel to use the hand wand. Avoid arc welding equipment, MRI testing (magnetic resonance imaging), TENS units (transcutaneous nerve stimulators).  Call the office for questions about other devices. Avoid electrical appliances that are in poor condition or are not properly grounded. Microwave ovens are safe to be near or to operate.  Additional information for defibrillator patients should your device go off: If your device goes off ONCE and you feel fine afterward, notify the device clinic nurses. If your device goes off ONCE and you do not feel well afterward, call 911. If your device goes off TWICE, call 911. If your device  goes off THREE times in one day, call 911.  DO NOT DRIVE YOURSELF OR A FAMILY MEMBER WITH A DEFIBRILLATOR TO THE HOSPITAL--CALL 911.

## 2022-03-10 ENCOUNTER — Other Ambulatory Visit: Payer: Self-pay

## 2022-03-10 DIAGNOSIS — I441 Atrioventricular block, second degree: Secondary | ICD-10-CM | POA: Diagnosis not present

## 2022-03-10 LAB — BASIC METABOLIC PANEL
Anion gap: 9 (ref 5–15)
BUN: 38 mg/dL — ABNORMAL HIGH (ref 8–23)
CO2: 20 mmol/L — ABNORMAL LOW (ref 22–32)
Calcium: 9.5 mg/dL (ref 8.9–10.3)
Chloride: 113 mmol/L — ABNORMAL HIGH (ref 98–111)
Creatinine, Ser: 2.4 mg/dL — ABNORMAL HIGH (ref 0.61–1.24)
GFR, Estimated: 27 mL/min — ABNORMAL LOW (ref >60)
Glucose, Bld: 93 mg/dL (ref 70–99)
Potassium: 3.6 mmol/L (ref 3.5–5.1)
Sodium: 142 mmol/L (ref 135–145)

## 2022-03-10 LAB — SURGICAL PCR SCREEN
MRSA, PCR: NEGATIVE
Staphylococcus aureus: NEGATIVE

## 2022-03-10 LAB — LIPOPROTEIN A (LPA): Lipoprotein (a): 64.1 nmol/L — ABNORMAL HIGH (ref <75.0)

## 2022-03-10 MED ORDER — CHLORHEXIDINE GLUCONATE 4 % EX LIQD
60.0000 mL | Freq: Once | CUTANEOUS | Status: AC
  Administered 2022-03-10: 4 via TOPICAL

## 2022-03-10 MED ORDER — CHLORHEXIDINE GLUCONATE 4 % EX LIQD
60.0000 mL | Freq: Once | CUTANEOUS | Status: AC
  Administered 2022-03-11: 4 via TOPICAL

## 2022-03-10 MED ORDER — SODIUM CHLORIDE 0.9 % IV SOLN: 250.0000 mL | INTRAVENOUS | Status: DC

## 2022-03-10 MED ORDER — SODIUM CHLORIDE 0.9 % IV SOLN
80.0000 mg | INTRAVENOUS | Status: AC
  Administered 2022-03-11: 80 mg

## 2022-03-10 MED ORDER — CHLORHEXIDINE GLUCONATE 4 % EX LIQD: 60.0000 mL | Freq: Once | CUTANEOUS | Status: DC

## 2022-03-10 MED ORDER — SODIUM CHLORIDE 0.9% FLUSH: 3.0000 mL | INTRAVENOUS | Status: DC | PRN

## 2022-03-10 MED ORDER — SODIUM CHLORIDE 0.9 % IV SOLN: INTRAVENOUS | Status: DC

## 2022-03-10 MED ORDER — POTASSIUM CHLORIDE CRYS ER 20 MEQ PO TBCR
40.0000 meq | EXTENDED_RELEASE_TABLET | Freq: Once | ORAL | Status: AC
Start: 2022-03-10 — End: 2022-03-10
  Administered 2022-03-10: 40 meq via ORAL
  Filled 2022-03-10: qty 2

## 2022-03-10 MED ORDER — SODIUM CHLORIDE 0.9 % IV SOLN
80.0000 mg | INTRAVENOUS | Status: DC
  Filled 2022-03-10: qty 2

## 2022-03-10 MED ORDER — CEFAZOLIN SODIUM-DEXTROSE 2-4 GM/100ML-% IV SOLN
2.0000 g | INTRAVENOUS | Status: AC
  Administered 2022-03-11: 2 g via INTRAVENOUS

## 2022-03-10 MED ORDER — CEFAZOLIN SODIUM-DEXTROSE 2-4 GM/100ML-% IV SOLN: 2.0000 g | INTRAVENOUS | Status: DC

## 2022-03-10 NOTE — Progress Notes (Signed)
Progress Note  Patient Name: MONTAGUE CORELLA Date of Encounter: 03/10/2022  [facility] Cardiologist: Shelva Majestic, MD   Subjective   Complains of back pain but no other issues  Inpatient Medications    Scheduled Meds:  aspirin EC  81 mg Oral Daily   atorvastatin  80 mg Oral q1800   buPROPion  300 mg Oral Daily   Chlorhexidine Gluconate Cloth  6 each Topical Daily   fluticasone furoate-vilanterol  1 puff Inhalation Daily   And   umeclidinium bromide  1 puff Inhalation Daily   furosemide  40 mg Oral Daily   predniSONE  10 mg Oral Daily   sertraline  100 mg Oral Daily   sodium chloride flush  3 mL Intravenous Q12H   sodium chloride flush  3 mL Intravenous Q12H   Continuous Infusions:  sodium chloride     sodium chloride 10 mL/hr at 03/10/22 0700   PRN Meds: sodium chloride, sodium chloride, oxyCODONE **AND** acetaminophen, acetaminophen, albuterol, clonazePAM, ondansetron (ZOFRAN) IV, mouth rinse, sodium chloride flush   Vital Signs    Vitals:   03/10/22 0400 03/10/22 0500 03/10/22 0600 03/10/22 0700  BP: (!) 160/54 (!) 176/63 (!) 165/52 (!) 150/83  Pulse: (!) 50 (!) 50 (!) 50 (!) 50  Resp: 19 20 18 18   Temp: 98.2 F (36.8 C)     TempSrc: Oral     SpO2: 92% 95% 94% 91%  Weight:      Height:        Intake/Output Summary (Last 24 hours) at 03/10/2022 0754 Last data filed at 03/10/2022 0700 Gross per 24 hour  Intake 428.57 ml  Output 2725 ml  Net -2296.43 ml      03/08/2022    1:00 PM 01/09/2022    9:25 AM 09/11/2021    4:01 AM  Last 3 Weights  Weight (lbs) 243 lb 243 lb 9.6 oz 249 lb 1.9 oz  Weight (kg) 110.224 kg 110.496 kg 113 kg      Telemetry    Sinus rhythm, complete AV block, V paced - Personally Reviewed  ECG    None new - Personally Reviewed  Physical Exam   GEN: No acute distress.   Neck: No JVD Cardiac: RRR, no murmurs, rubs, or gallops.  Respiratory: Clear to auscultation bilaterally. GI: Soft, nontender, non-distended  MS: No edema;  No deformity. Neuro:  Nonfocal  Psych: Normal affect   Labs    High Sensitivity Troponin:   Recent Labs  Lab 03/08/22 1305 03/08/22 1512  TROPONINIHS 29* 28*     Chemistry Recent Labs  Lab 03/08/22 1305 03/09/22 0252 03/10/22 0315  NA 138 143 142  K 4.0 4.0 3.6  CL 110 111 113*  CO2 20* 18* 20*  GLUCOSE 144* 117* 93  BUN 45* 42* 38*  CREATININE 2.75* 2.47* 2.40*  CALCIUM 9.3 10.0 9.5  MG 2.0  --   --   PROT 6.2*  --   --   ALBUMIN 3.6  --   --   AST 20  --   --   ALT 31  --   --   ALKPHOS 89  --   --   BILITOT 0.7  --   --   GFRNONAA 23* 26* 27*  ANIONGAP 8 14 9     Lipids  Recent Labs  Lab 03/09/22 0252  CHOL 123  TRIG 89  HDL 39*  LDLCALC 66  CHOLHDL 3.2    Hematology Recent Labs  Lab 03/08/22 1305  03/09/22 0252  WBC 8.3 10.1  RBC 4.04* 4.36  HGB 11.6* 12.5*  HCT 36.9* 38.8*  MCV 91.3 89.0  MCH 28.7 28.7  MCHC 31.4 32.2  RDW 15.0 15.0  PLT 140* 152   Thyroid  Recent Labs  Lab 03/08/22 1305 03/08/22 1330  TSH  --  1.130  FREET4 0.88  --     BNP Recent Labs  Lab 03/08/22 1305  BNP 789.1*    DDimer No results for input(s): "DDIMER" in the last 168 hours.   Radiology    ECHOCARDIOGRAM COMPLETE  Result Date: 03/09/2022    ECHOCARDIOGRAM REPORT   Patient Name:   LEVIS NAZIR Laverdure Date of Exam: 03/09/2022 Medical Rec #:  [id number]      Height:       69.0 in Accession #:    [id number]     Weight:       243.0 lb Date of Birth:  30-Jul-1946       BSA:          2.244 m Patient Age:    65 years       BP:           135/53 mmHg Patient Gender: M              HR:           50 bpm. Exam Location:  Inpatient Procedure: 2D Echo, Color Doppler, Cardiac Doppler and Intracardiac            Opacification Agent Indications:     Heart Block i44.1  History:         Patient has prior history of Echocardiogram examinations, most                  recent 09/07/2021. CHF, COPD; Risk Factors:Hypertension, Diabetes                  and Dyslipidemia.  Sonographer:     Raquel Sarna  Senior RDCS Referring Phys:  [place] Diagnosing Phys: Oswaldo Milian MD  Sonographer Comments: Technically difficult due to lung interference, scanned supine due to temporary pacemaker IMPRESSIONS  1. Left ventricular ejection fraction, by estimation, is 30 to 35%. The left ventricle has moderately decreased function. The left ventricle demonstrates regional wall motion abnormalities (see scoring diagram/findings for description). Apical aneurysm.  The left ventricular internal cavity size was mildly dilated. There is mild left ventricular hypertrophy. Left ventricular diastolic parameters are indeterminate.  2. Right ventricular systolic function is normal. The right ventricular size is normal. There is moderately elevated pulmonary artery systolic pressure. The estimated right ventricular systolic pressure is 96.2 mmHg.  3. Right atrial size was mildly dilated.  4. The mitral valve is normal in structure. Mild mitral valve regurgitation.  5. The aortic valve is tricuspid. Aortic valve regurgitation is trivial. AV gradients were not measured  6. The inferior vena cava is dilated in size with <50% respiratory variability, suggesting right atrial pressure of 15 mmHg. FINDINGS  Left Ventricle: Left ventricular ejection fraction, by estimation, is 30 to 35%. The left ventricle has moderately decreased function. The left ventricle demonstrates regional wall motion abnormalities. Definity contrast agent was given IV to delineate the left ventricular endocardial borders. The left ventricular internal cavity size was mildly dilated. There is mild left ventricular hypertrophy. Left ventricular diastolic parameters are indeterminate.  LV Wall Scoring: The mid and distal anterior wall, mid and distal anterior septum, mid inferoseptal segment, apical inferior segment, and apex  are akinetic. The entire lateral wall, inferior wall, basal anteroseptal segment, basal anterior segment, and basal inferoseptal  segment are normal. Right Ventricle: The right ventricular size is normal. No increase in right ventricular wall thickness. Right ventricular systolic function is normal. There is moderately elevated pulmonary artery systolic pressure. The tricuspid regurgitant velocity is 2.76 m/s, and with an assumed right atrial pressure of 15 mmHg, the estimated right ventricular systolic pressure is 56.4 mmHg. Left Atrium: Left atrial size was normal in size. Right Atrium: Right atrial size was mildly dilated. Pericardium: There is no evidence of pericardial effusion. Mitral Valve: The mitral valve is normal in structure. Mild mitral valve regurgitation. Tricuspid Valve: The tricuspid valve is normal in structure. Tricuspid valve regurgitation is trivial. Aortic Valve: The aortic valve is tricuspid. Aortic valve regurgitation is trivial. Aortic valve sclerosis is present, with no evidence of aortic valve stenosis. Pulmonic Valve: The pulmonic valve was not well visualized. Pulmonic valve regurgitation is not visualized. Aorta: The aortic root and ascending aorta are structurally normal, with no evidence of dilitation. Venous: The inferior vena cava is dilated in size with less than 50% respiratory variability, suggesting right atrial pressure of 15 mmHg. IAS/Shunts: The interatrial septum was not well visualized.  LEFT VENTRICLE PLAX 2D LVIDd:         5.80 cm LVIDs:         4.30 cm LV PW:         1.30 cm LV IVS:        1.00 cm LVOT diam:     2.20 cm LV SV:         98 LV SV Index:   44 LVOT Area:     3.80 cm  LV Volumes (MOD) LV vol d, MOD A4C: 164.0 ml LV vol s, MOD A4C: 104.0 ml LV SV MOD A4C:     164.0 ml RIGHT VENTRICLE RV S prime:     14.90 cm/s TAPSE (M-mode): 1.7 cm LEFT ATRIUM             Index        RIGHT ATRIUM           Index LA diam:        3.60 cm 1.60 cm/m   RA Area:     23.90 cm LA Vol (A2C):   84.1 ml 37.47 ml/m  RA Volume:   75.90 ml  33.82 ml/m LA Vol (A4C):   65.2 ml 29.05 ml/m LA Biplane Vol: 74.2 ml  33.06 ml/m  AORTIC VALVE LVOT Vmax:   111.00 cm/s LVOT Vmean:  77.100 cm/s LVOT VTI:    0.258 m  AORTA Ao Root diam: 3.40 cm Ao Asc diam:  3.30 cm TRICUSPID VALVE TR Peak grad:   30.5 mmHg TR Vmax:        276.00 cm/s  SHUNTS Systemic VTI:  0.26 m Systemic Diam: 2.20 cm Oswaldo Milian MD Electronically signed by Oswaldo Milian MD Signature Date/Time: 03/09/2022/1:27:03 PM    Final (Updated)    DG Chest Portable 1 View  Result Date: 03/08/2022 CLINICAL DATA:  ssob EXAM: PORTABLE CHEST 1 VIEW COMPARISON:  Chest x-ray 09/09/2021 FINDINGS: Interval removal of a left internal jugular central venous catheter. Enlarged cardiac silhouette with some component likely due to AP portable technique. The heart and mediastinal contours are unchanged. Aortic calcification. Prominent hilar vasculature. No focal consolidation. Increased markings. No pleural effusion. No pneumothorax. No acute osseous abnormality. IMPRESSION: Pulmonary edema. Electronically Signed   By: Thomasena Edis  Mckinley Jewel M.D.   On: 03/08/2022 16:39   CARDIAC CATHETERIZATION  Result Date: 03/08/2022 CONCLUSION: Successful temporary transvenous pacemaker via right femoral venous approach.  Threshold less than 1 mA.  Pacer output set at 5 mm and rate 50 in synchronous mode. PLAN: Permanent pacemaker in a.m.    Cardiac Studies   TTE  1. Left ventricular ejection fraction, by estimation, is 30 to 35%. The  left ventricle has moderately decreased function. The left ventricle  demonstrates regional wall motion abnormalities (see scoring  diagram/findings for description). Apical aneurysm.   The left ventricular internal cavity size was mildly dilated. There is  mild left ventricular hypertrophy. Left ventricular diastolic parameters  are indeterminate.   2. Right ventricular systolic function is normal. The right ventricular  size is normal. There is moderately elevated pulmonary artery systolic  pressure. The estimated right ventricular systolic  pressure is 68.0 mmHg.   3. Right atrial size was mildly dilated.   4. The mitral valve is normal in structure. Mild mitral valve  regurgitation.   5. The aortic valve is tricuspid. Aortic valve regurgitation is trivial.  AV gradients were not measured   6. The inferior vena cava is dilated in size with <50% respiratory  variability, suggesting right atrial pressure of 15 mmHg.   Patient Profile     76 y.o. male with a history of ischemic cardiomyopathy who presented to the hospital with complete heart block.  Assessment & Plan    1.  Complete heart block: Status post temporary wire implant.  We [place] plan for CRT-D implant tomorrow. 2.  Coronary artery disease: Multiple stents.  No current chest pain. 3.  Chronic systolic heart failure: Ejection fraction 30 to 35%.  Plan for CRT-D implant tomorrow.  Diuresing with p.o. Lasix. 4.  History of LV thrombus: Holding Eliquis for ICD implant 5.  Acute on chronic renal failure: Creatinine elevated.  Continuing to diurese.  Likely due to heart block and ventricular pacing  For questions or updates, please contact [place] Please consult [email or web address] for contact info under        Signed, Allanah Mcfarland Meredith Leeds, MD  03/10/2022, 7:54 AM

## 2022-03-10 NOTE — Plan of Care (Signed)
  Problem: Health Behavior/Discharge Planning: Goal: Ability to manage health-related needs will improve Outcome: Progressing   Problem: Clinical Measurements: Goal: Respiratory complications will improve Outcome: Progressing   Problem: Cardiovascular: Goal: Ability to achieve and maintain adequate cardiovascular perfusion will improve Outcome: Progressing   Problem: Health Behavior/Discharge Planning: Goal: Ability to safely manage health-related needs after discharge will improve Outcome: Progressing

## 2022-03-11 ENCOUNTER — Encounter (HOSPITAL_COMMUNITY)
Admission: EM | Disposition: A | Discharge status: Home / Self Care | Payer: Self-pay | Source: Home / Self Care | Attending: Cardiovascular Disease

## 2022-03-11 DIAGNOSIS — I255 Ischemic cardiomyopathy: Secondary | ICD-10-CM

## 2022-03-11 DIAGNOSIS — I5023 Acute on chronic systolic (congestive) heart failure: Secondary | ICD-10-CM

## 2022-03-11 HISTORY — PX: BIV ICD INSERTION CRT-D: EP1195

## 2022-03-11 LAB — BASIC METABOLIC PANEL
Anion gap: 9 (ref 5–15)
BUN: 37 mg/dL — ABNORMAL HIGH (ref 8–23)
CO2: 21 mmol/L — ABNORMAL LOW (ref 22–32)
Calcium: 9.5 mg/dL (ref 8.9–10.3)
Chloride: 110 mmol/L (ref 98–111)
Creatinine, Ser: 2.26 mg/dL — ABNORMAL HIGH (ref 0.61–1.24)
GFR, Estimated: 29 mL/min — ABNORMAL LOW (ref >60)
Glucose, Bld: 162 mg/dL — ABNORMAL HIGH (ref 70–99)
Potassium: 4.1 mmol/L (ref 3.5–5.1)
Sodium: 140 mmol/L (ref 135–145)

## 2022-03-11 LAB — CBC
HCT: 37.7 % — ABNORMAL LOW (ref 39.0–52.0)
Hemoglobin: 12.1 g/dL — ABNORMAL LOW (ref 13.0–17.0)
MCH: 28.9 pg (ref 26.0–34.0)
MCHC: 32.1 g/dL (ref 30.0–36.0)
MCV: 90.2 fL (ref 80.0–100.0)
Platelets: 140 10*3/uL — ABNORMAL LOW (ref 150–400)
RBC: 4.18 MIL/uL — ABNORMAL LOW (ref 4.22–5.81)
RDW: 14.8 % (ref 11.5–15.5)
WBC: 9.9 10*3/uL (ref 4.0–10.5)
nRBC: 0 % (ref 0.0–0.2)

## 2022-03-11 LAB — PROTIME-INR
INR: 1.2 (ref 0.8–1.2)
Prothrombin Time: 15 seconds (ref 11.4–15.2)

## 2022-03-11 SURGERY — BIV ICD INSERTION CRT-D

## 2022-03-11 MED ORDER — CARVEDILOL 25 MG PO TABS
25.0000 mg | ORAL_TABLET | Freq: Two times a day (BID) | ORAL | Status: DC
  Administered 2022-03-11 – 2022-03-12 (×2): 25 mg via ORAL
  Filled 2022-03-11 (×2): qty 1

## 2022-03-11 MED ORDER — PREDNISONE 10 MG PO TABS
10.0000 mg | ORAL_TABLET | Freq: Every day | ORAL | Status: DC
  Administered 2022-03-11 – 2022-03-12 (×2): 10 mg via ORAL
  Filled 2022-03-11 (×2): qty 1

## 2022-03-11 MED ORDER — BUPROPION HCL ER (XL) 150 MG PO TB24
300.0000 mg | ORAL_TABLET | Freq: Every day | ORAL | Status: DC
  Administered 2022-03-11 – 2022-03-12 (×2): 300 mg via ORAL
  Filled 2022-03-11 (×2): qty 2

## 2022-03-11 MED ORDER — CARVEDILOL 25 MG PO TABS: 25.0000 mg | ORAL_TABLET | Freq: Two times a day (BID) | ORAL | Status: DC

## 2022-03-11 MED ORDER — SODIUM CHLORIDE 0.9 % IV SOLN
INTRAVENOUS | Status: AC
  Filled 2022-03-11: qty 2

## 2022-03-11 MED ORDER — CEFAZOLIN SODIUM-DEXTROSE 1-4 GM/50ML-% IV SOLN
1.0000 g | Freq: Four times a day (QID) | INTRAVENOUS | Status: AC
  Administered 2022-03-11 – 2022-03-12 (×3): 1 g via INTRAVENOUS
  Filled 2022-03-11 (×3): qty 50

## 2022-03-11 MED ORDER — ASPIRIN 81 MG PO TBEC
81.0000 mg | DELAYED_RELEASE_TABLET | Freq: Every day | ORAL | Status: DC
  Administered 2022-03-11: 81 mg via ORAL
  Filled 2022-03-11: qty 1

## 2022-03-11 MED ORDER — LIDOCAINE HCL (PF) 1 % IJ SOLN
INTRAMUSCULAR | Status: DC | PRN
  Administered 2022-03-11: 60 mL

## 2022-03-11 MED ORDER — SERTRALINE HCL 100 MG PO TABS
100.0000 mg | ORAL_TABLET | Freq: Every day | ORAL | Status: DC
  Administered 2022-03-11 – 2022-03-12 (×2): 100 mg via ORAL
  Filled 2022-03-11 (×2): qty 1

## 2022-03-11 MED ORDER — LORAZEPAM 2 MG/ML IJ SOLN
1.0000 mg | Freq: Once | INTRAMUSCULAR | Status: AC
  Administered 2022-03-11: 1 mg via INTRAVENOUS
  Filled 2022-03-11: qty 1

## 2022-03-11 MED ORDER — HEPARIN (PORCINE) IN NACL 1000-0.9 UT/500ML-% IV SOLN
INTRAVENOUS | Status: DC | PRN
  Administered 2022-03-11: 500 mL

## 2022-03-11 MED ORDER — IODIXANOL 320 MG/ML IV SOLN
INTRAVENOUS | Status: DC | PRN
  Administered 2022-03-11: 5 mL

## 2022-03-11 MED ORDER — HEPARIN (PORCINE) IN NACL 1000-0.9 UT/500ML-% IV SOLN
INTRAVENOUS | Status: AC
  Filled 2022-03-11: qty 500

## 2022-03-11 MED ORDER — HYDRALAZINE HCL 25 MG PO TABS
25.0000 mg | ORAL_TABLET | Freq: Three times a day (TID) | ORAL | Status: DC
  Administered 2022-03-11 – 2022-03-12 (×3): 25 mg via ORAL
  Filled 2022-03-11 (×3): qty 1

## 2022-03-11 MED ORDER — NICOTINE 14 MG/24HR TD PT24
14.0000 mg | MEDICATED_PATCH | Freq: Every day | TRANSDERMAL | Status: DC
  Administered 2022-03-11: 14 mg via TRANSDERMAL
  Filled 2022-03-11 (×2): qty 1

## 2022-03-11 MED ORDER — LIDOCAINE HCL (PF) 1 % IJ SOLN
INTRAMUSCULAR | Status: AC
  Filled 2022-03-11: qty 60

## 2022-03-11 MED ORDER — CEFAZOLIN SODIUM-DEXTROSE 2-4 GM/100ML-% IV SOLN
INTRAVENOUS | Status: AC
  Filled 2022-03-11: qty 100

## 2022-03-11 SURGICAL SUPPLY — 19 items
BALLN COR SINUS VENO 6FR 80 (BALLOONS) ×2
BALLOON COR SINUS VENO 6FR 80 (BALLOONS) IMPLANT
CABLE SURGICAL S-101-97-12 (CABLE) ×2 IMPLANT
CATH CPS DIRECT 135 DS2C020 (CATHETERS) ×1 IMPLANT
ICD GALLANT HFCRTD CDHFA500Q (ICD Generator) ×1 IMPLANT
KIT ESSENTIALS PG (KITS) ×1 IMPLANT
LEAD DURATA 7122Q-65CM (Lead) ×1 IMPLANT
LEAD QUARTET 1458QL-86 (Lead) IMPLANT
LEAD TENDRIL MRI 52CM LPA1200M (Lead) ×1 IMPLANT
MAT PREVALON FULL STRYKER (MISCELLANEOUS) ×1 IMPLANT
PAD DEFIB RADIO PHYSIO CONN (PAD) ×2 IMPLANT
QUARTET 1458QL-86 (Lead) ×2 IMPLANT
SHEATH 7FR PRELUDE SNAP 13 (SHEATH) ×1 IMPLANT
SHEATH 8FR PRELUDE SNAP 13 (SHEATH) ×1 IMPLANT
SHEATH PROBE COVER 6X72 (BAG) ×1 IMPLANT
SLITTER AGILIS HISPRO (INSTRUMENTS) ×1 IMPLANT
TRAY PACEMAKER INSERTION (PACKS) ×2 IMPLANT
WIRE ACUITY WHISPER EDS 4648 (WIRE) ×1 IMPLANT
WIRE HI TORQ VERSACORE-J 145CM (WIRE) ×1 IMPLANT

## 2022-03-11 NOTE — Interval H&P Note (Signed)
History and Physical Interval Note:  03/11/2022 7:21 AM  Bobby [name]  has presented today for surgery, with the diagnosis of [place].  The various methods of treatment have been discussed with the patient and family. After consideration of risks, benefits and other options for treatment, the patient has consented to  Procedure(s): BIV ICD INSERTION CRT-D (N/A) as a surgical intervention.  The patient's history has been reviewed, patient examined, no change in status, stable for surgery.  I have reviewed the patient's chart and labs.  Questions were answered to the patient's satisfaction.     Bobby [name]  ICD Criteria  Current LVEF:33%. Within 12 months prior to implant: Yes   Heart failure history: Yes, Class II  Cardiomyopathy history: Yes, Ischemic Cardiomyopathy - Prior MI.  Atrial Fibrillation/Atrial Flutter: No.  Ventricular tachycardia history: No.  Cardiac arrest history: No.  History of syndromes with risk of sudden death: No.  Previous ICD: No.  Current ICD indication: Primary  PPM indication: Yes. Pacing type: Ventricular. Greater than 40% RV pacing requirement anticipated. Indication: Complete Heart Block  Class I or II Bradycardia indication present: Yes  Beta Blocker therapy for 3 or more months: Yes, prescribed.   Ace Inhibitor/ARB therapy for 3 or more months: Yes, prescribed.    I have seen Bobby [name] is a 76 y.o. malepre-procedural and has been referred by [facility] for consideration of ICD implant for primary prevention of sudden death.  The patient's chart has been reviewed and they meet criteria for ICD implant.  I have had a thorough discussion with the patient reviewing options.  The patient and their family (if available) have had opportunities to ask questions and have them answered. The patient and I have decided together through the [place] Support Tool to implant ICD at this time.   Risks, benefits, alternatives to ICD implantation were discussed in detail with the patient today. The patient  understands that the risks include but are not limited to bleeding, infection, pneumothorax, perforation, tamponade, vascular damage, renal failure, MI, stroke, death, inappropriate shocks, and lead dislodgement and wishes to proceed.

## 2022-03-11 NOTE — Progress Notes (Signed)
Patient transferred to cath lab by Aaron Edelman. Patient is prepped for procedure, consent is signed, and MD educated patient this morning on procedure again.

## 2022-03-11 NOTE — Progress Notes (Signed)
Patient transferred to [phone]. RN to receive patient.

## 2022-03-11 NOTE — H&P (View-Only) (Signed)
Progress Note  Patient Name: Bobby [name] Date of Encounter: 03/11/2022  [facility] Cardiologist: Shelva Majestic, MD   Subjective   Complains of back pain and anxiety.  No other issues.  Ready for ICD implant today.  Inpatient Medications    Scheduled Meds:  aspirin EC  81 mg Oral Daily   atorvastatin  80 mg Oral q1800   buPROPion  300 mg Oral Daily   Chlorhexidine Gluconate Cloth  6 each Topical Daily   fluticasone furoate-vilanterol  1 puff Inhalation Daily   And   umeclidinium bromide  1 puff Inhalation Daily   furosemide  40 mg Oral Daily   gentamicin (GARAMYCIN) 80 mg in sodium chloride 0.9 % 500 mL irrigation  80 mg Irrigation On Call   nicotine  14 mg Transdermal Daily   predniSONE  10 mg Oral Daily   sertraline  100 mg Oral Daily   sodium chloride flush  3 mL Intravenous Q12H   sodium chloride flush  3 mL Intravenous Q12H   Continuous Infusions:  sodium chloride     sodium chloride 10 mL/hr at 03/11/22 0700   sodium chloride Stopped (03/10/22 0851)   sodium chloride Stopped (03/10/22 0850)    ceFAZolin (ANCEF) IV     PRN Meds: sodium chloride, sodium chloride, oxyCODONE **AND** acetaminophen, acetaminophen, albuterol, clonazePAM, ondansetron (ZOFRAN) IV, mouth rinse, sodium chloride flush, sodium chloride flush   Vital Signs    Vitals:   03/11/22 0400 03/11/22 0500 03/11/22 0600 03/11/22 0700  BP: (!) 171/71 (!) 164/84 (!) 168/66   Pulse: (!) 50 (!) 49 (!) 50 (!) 50  Resp: 20 14 14 18   Temp:      TempSrc:      SpO2: 92% 93% 92% 93%  Weight:      Height:        Intake/Output Summary (Last 24 hours) at 03/11/2022 0718 Last data filed at 03/11/2022 0700 Gross per 24 hour  Intake 1798.58 ml  Output 3000 ml  Net -1201.42 ml       03/11/2022    3:00 AM 03/08/2022    1:00 PM 01/09/2022    9:25 AM  Last 3 Weights  Weight (lbs) 246 lb 7.6 oz 243 lb 243 lb 9.6 oz  Weight (kg) 111.8 kg 110.224 kg 110.496 kg      Telemetry    Sinus rhythm, complete AV  block, ventricular paced-personally reviewed  ECG  None new-personally reviewed  Physical Exam   GEN: Well nourished, well developed, in no acute distress  HEENT: normal  Neck: no JVD, carotid bruits, or masses Cardiac: RRR; no murmurs, rubs, or gallops,no edema  Respiratory:  clear to auscultation bilaterally, normal work of breathing GI: soft, nontender, nondistended, + BS MS: no deformity or atrophy  Skin: warm and dry Neuro:  Strength and sensation are intact Psych: euthymic mood, full affect   Labs    High Sensitivity Troponin:   Recent Labs  Lab 03/08/22 1305 03/08/22 1512  TROPONINIHS 29* 28*      Chemistry Recent Labs  Lab 03/08/22 1305 03/09/22 0252 03/10/22 0315 03/11/22 0030  NA 138 143 142 140  K 4.0 4.0 3.6 4.1  CL 110 111 113* 110  CO2 20* 18* 20* 21*  GLUCOSE 144* 117* 93 162*  BUN 45* 42* 38* 37*  CREATININE 2.75* 2.47* 2.40* 2.26*  CALCIUM 9.3 10.0 9.5 9.5  MG 2.0  --   --   --   PROT 6.2*  --   --   --  ALBUMIN 3.6  --   --   --   AST 20  --   --   --   ALT 31  --   --   --   ALKPHOS 89  --   --   --   BILITOT 0.7  --   --   --   GFRNONAA 23* 26* 27* 29*  ANIONGAP 8 14 9 9      Lipids  Recent Labs  Lab 03/09/22 0252  CHOL 123  TRIG 89  HDL 39*  LDLCALC 66  CHOLHDL 3.2     Hematology Recent Labs  Lab 03/08/22 1305 03/09/22 0252 03/11/22 0030  WBC 8.3 10.1 9.9  RBC 4.04* 4.36 4.18*  HGB 11.6* 12.5* 12.1*  HCT 36.9* 38.8* 37.7*  MCV 91.3 89.0 90.2  MCH 28.7 28.7 28.9  MCHC 31.4 32.2 32.1  RDW 15.0 15.0 14.8  PLT 140* 152 140*    Thyroid  Recent Labs  Lab 03/08/22 1305 03/08/22 1330  TSH  --  1.130  FREET4 0.88  --      BNP Recent Labs  Lab 03/08/22 1305  BNP 789.1*     DDimer No results for input(s): "DDIMER" in the last 168 hours.   Radiology    ECHOCARDIOGRAM COMPLETE  Result Date: 03/09/2022    ECHOCARDIOGRAM REPORT   Patient Name:   Bobby [name] Date of Exam: 03/09/2022 Medical Rec #:  [id number]       Height:       69.0 in Accession #:    [id number]     Weight:       243.0 lb Date of Birth:  1945-10-02       BSA:          2.244 m Patient Age:    76 years       BP:           135/53 mmHg Patient Gender: M              HR:           50 bpm. Exam Location:  Inpatient Procedure: 2D Echo, Color Doppler, Cardiac Doppler and Intracardiac            Opacification Agent Indications:     Heart Block i44.1  History:         Patient has prior history of Echocardiogram examinations, most                  recent 09/07/2021. CHF, COPD; Risk Factors:Hypertension, Diabetes                  and Dyslipidemia.  Sonographer:     Raquel Sarna Senior RDCS Referring Phys:  [place] Diagnosing Phys: Oswaldo Milian MD  Sonographer Comments: Technically difficult due to lung interference, scanned supine due to temporary pacemaker IMPRESSIONS  1. Left ventricular ejection fraction, by estimation, is 30 to 35%. The left ventricle has moderately decreased function. The left ventricle demonstrates regional wall motion abnormalities (see scoring diagram/findings for description). Apical aneurysm.  The left ventricular internal cavity size was mildly dilated. There is mild left ventricular hypertrophy. Left ventricular diastolic parameters are indeterminate.  2. Right ventricular systolic function is normal. The right ventricular size is normal. There is moderately elevated pulmonary artery systolic pressure. The estimated right ventricular systolic pressure is 73.4 mmHg.  3. Right atrial size was mildly dilated.  4. The mitral valve is normal in structure. Mild mitral valve regurgitation.  5. The aortic valve is tricuspid. Aortic valve regurgitation is trivial. AV gradients were not measured  6. The inferior vena cava is dilated in size with <50% respiratory variability, suggesting right atrial pressure of 15 mmHg. FINDINGS  Left Ventricle: Left ventricular ejection fraction, by estimation, is 30 to 35%. The left ventricle has  moderately decreased function. The left ventricle demonstrates regional wall motion abnormalities. Definity contrast agent was given IV to delineate the left ventricular endocardial borders. The left ventricular internal cavity size was mildly dilated. There is mild left ventricular hypertrophy. Left ventricular diastolic parameters are indeterminate.  LV Wall Scoring: The mid and distal anterior wall, mid and distal anterior septum, mid inferoseptal segment, apical inferior segment, and apex are akinetic. The entire lateral wall, inferior wall, basal anteroseptal segment, basal anterior segment, and basal inferoseptal segment are normal. Right Ventricle: The right ventricular size is normal. No increase in right ventricular wall thickness. Right ventricular systolic function is normal. There is moderately elevated pulmonary artery systolic pressure. The tricuspid regurgitant velocity is 2.76 m/s, and with an assumed right atrial pressure of 15 mmHg, the estimated right ventricular systolic pressure is 87.5 mmHg. Left Atrium: Left atrial size was normal in size. Right Atrium: Right atrial size was mildly dilated. Pericardium: There is no evidence of pericardial effusion. Mitral Valve: The mitral valve is normal in structure. Mild mitral valve regurgitation. Tricuspid Valve: The tricuspid valve is normal in structure. Tricuspid valve regurgitation is trivial. Aortic Valve: The aortic valve is tricuspid. Aortic valve regurgitation is trivial. Aortic valve sclerosis is present, with no evidence of aortic valve stenosis. Pulmonic Valve: The pulmonic valve was not well visualized. Pulmonic valve regurgitation is not visualized. Aorta: The aortic root and ascending aorta are structurally normal, with no evidence of dilitation. Venous: The inferior vena cava is dilated in size with less than 50% respiratory variability, suggesting right atrial pressure of 15 mmHg. IAS/Shunts: The interatrial septum was not well visualized.   LEFT VENTRICLE PLAX 2D LVIDd:         5.80 cm LVIDs:         4.30 cm LV PW:         1.30 cm LV IVS:        1.00 cm LVOT diam:     2.20 cm LV SV:         98 LV SV Index:   44 LVOT Area:     3.80 cm  LV Volumes (MOD) LV vol d, MOD A4C: 164.0 ml LV vol s, MOD A4C: 104.0 ml LV SV MOD A4C:     164.0 ml RIGHT VENTRICLE RV S prime:     14.90 cm/s TAPSE (M-mode): 1.7 cm LEFT ATRIUM             Index        RIGHT ATRIUM           Index LA diam:        3.60 cm 1.60 cm/m   RA Area:     23.90 cm LA Vol (A2C):   84.1 ml 37.47 ml/m  RA Volume:   75.90 ml  33.82 ml/m LA Vol (A4C):   65.2 ml 29.05 ml/m LA Biplane Vol: 74.2 ml 33.06 ml/m  AORTIC VALVE LVOT Vmax:   111.00 cm/s LVOT Vmean:  77.100 cm/s LVOT VTI:    0.258 m  AORTA Ao Root diam: 3.40 cm Ao Asc diam:  3.30 cm TRICUSPID VALVE TR Peak grad:   30.5 mmHg TR Vmax:  276.00 cm/s  SHUNTS Systemic VTI:  0.26 m Systemic Diam: 2.20 cm Oswaldo Milian MD Electronically signed by Oswaldo Milian MD Signature Date/Time: 03/09/2022/1:27:03 PM    Final (Updated)     Cardiac Studies   TTE  1. Left ventricular ejection fraction, by estimation, is 30 to 35%. The  left ventricle has moderately decreased function. The left ventricle  demonstrates regional wall motion abnormalities (see scoring  diagram/findings for description). Apical aneurysm.   The left ventricular internal cavity size was mildly dilated. There is  mild left ventricular hypertrophy. Left ventricular diastolic parameters  are indeterminate.   2. Right ventricular systolic function is normal. The right ventricular  size is normal. There is moderately elevated pulmonary artery systolic  pressure. The estimated right ventricular systolic pressure is 29.2 mmHg.   3. Right atrial size was mildly dilated.   4. The mitral valve is normal in structure. Mild mitral valve  regurgitation.   5. The aortic valve is tricuspid. Aortic valve regurgitation is trivial.  AV gradients were not measured    6. The inferior vena cava is dilated in size with <50% respiratory  variability, suggesting right atrial pressure of 15 mmHg.   Patient Profile     76 y.o. male with a history of ischemic cardiomyopathy who presented to the hospital with complete heart block.  Assessment & Plan    1.  Complete heart block: Status post temporary wire implant.  Plan for CRT-D implant today.  Risk and benefits were discussed which include bleeding, tamponade, infection, pneumothorax, lead dislodgment, inappropriate shocks.  He understands these risks and is agreed to the procedure.  2.  Coronary artery disease: Status post multiple stents.  No current chest pain.  Chronic systolic heart failure: Ejection fraction 30 to 35%.  Plan CRT-D implant today.  We Tanyia Grabbe restart heart failure medications post implant.  4.  History of LV thrombus: Holding Eliquis for ICD implant  5.  Acute on chronic renal failure: Creatinine significantly elevated.  Continuing to diuresis.  Geraldean Walen reassess post device implant.  For questions or updates, please contact [place] Please consult [email or web address] for contact info under        Signed, Merrilee Ancona Meredith Leeds, MD  03/11/2022, 7:18 AM

## 2022-03-11 NOTE — Plan of Care (Signed)
  Problem: Education: Goal: Understanding of cardiac disease, CV risk reduction, and recovery process will improve Outcome: Progressing   Problem: Coping: Goal: Level of anxiety will decrease Outcome: Progressing   Problem: Cardiovascular: Goal: Ability to achieve and maintain adequate cardiovascular perfusion will improve Outcome: Progressing

## 2022-03-11 NOTE — Progress Notes (Signed)
Progress Note  Patient Name: Bobby [name] Date of Encounter: 03/11/2022  [facility] Cardiologist: Shelva Majestic, MD   Subjective   Complains of back pain and anxiety.  No other issues.  Ready for ICD implant today.  Inpatient Medications    Scheduled Meds:  aspirin EC  81 mg Oral Daily   atorvastatin  80 mg Oral q1800   buPROPion  300 mg Oral Daily   Chlorhexidine Gluconate Cloth  6 each Topical Daily   fluticasone furoate-vilanterol  1 puff Inhalation Daily   And   umeclidinium bromide  1 puff Inhalation Daily   furosemide  40 mg Oral Daily   gentamicin (GARAMYCIN) 80 mg in sodium chloride 0.9 % 500 mL irrigation  80 mg Irrigation On Call   nicotine  14 mg Transdermal Daily   predniSONE  10 mg Oral Daily   sertraline  100 mg Oral Daily   sodium chloride flush  3 mL Intravenous Q12H   sodium chloride flush  3 mL Intravenous Q12H   Continuous Infusions:  sodium chloride     sodium chloride 10 mL/hr at 03/11/22 0700   sodium chloride Stopped (03/10/22 0851)   sodium chloride Stopped (03/10/22 0850)    ceFAZolin (ANCEF) IV     PRN Meds: sodium chloride, sodium chloride, oxyCODONE **AND** acetaminophen, acetaminophen, albuterol, clonazePAM, ondansetron (ZOFRAN) IV, mouth rinse, sodium chloride flush, sodium chloride flush   Vital Signs    Vitals:   03/11/22 0400 03/11/22 0500 03/11/22 0600 03/11/22 0700  BP: (!) 171/71 (!) 164/84 (!) 168/66   Pulse: (!) 50 (!) 49 (!) 50 (!) 50  Resp: 20 14 14 18   Temp:      TempSrc:      SpO2: 92% 93% 92% 93%  Weight:      Height:        Intake/Output Summary (Last 24 hours) at 03/11/2022 0718 Last data filed at 03/11/2022 0700 Gross per 24 hour  Intake 1798.58 ml  Output 3000 ml  Net -1201.42 ml       03/11/2022    3:00 AM 03/08/2022    1:00 PM 01/09/2022    9:25 AM  Last 3 Weights  Weight (lbs) 246 lb 7.6 oz 243 lb 243 lb 9.6 oz  Weight (kg) 111.8 kg 110.224 kg 110.496 kg      Telemetry    Sinus rhythm, complete AV  block, ventricular paced-personally reviewed  ECG  None new-personally reviewed  Physical Exam   GEN: Well nourished, well developed, in no acute distress  HEENT: normal  Neck: no JVD, carotid bruits, or masses Cardiac: RRR; no murmurs, rubs, or gallops,no edema  Respiratory:  clear to auscultation bilaterally, normal work of breathing GI: soft, nontender, nondistended, + BS MS: no deformity or atrophy  Skin: warm and dry Neuro:  Strength and sensation are intact Psych: euthymic mood, full affect   Labs    High Sensitivity Troponin:   Recent Labs  Lab 03/08/22 1305 03/08/22 1512  TROPONINIHS 29* 28*      Chemistry Recent Labs  Lab 03/08/22 1305 03/09/22 0252 03/10/22 0315 03/11/22 0030  NA 138 143 142 140  K 4.0 4.0 3.6 4.1  CL 110 111 113* 110  CO2 20* 18* 20* 21*  GLUCOSE 144* 117* 93 162*  BUN 45* 42* 38* 37*  CREATININE 2.75* 2.47* 2.40* 2.26*  CALCIUM 9.3 10.0 9.5 9.5  MG 2.0  --   --   --   PROT 6.2*  --   --   --  ALBUMIN 3.6  --   --   --   AST 20  --   --   --   ALT 31  --   --   --   ALKPHOS 89  --   --   --   BILITOT 0.7  --   --   --   GFRNONAA 23* 26* 27* 29*  ANIONGAP 8 14 9 9      Lipids  Recent Labs  Lab 03/09/22 0252  CHOL 123  TRIG 89  HDL 39*  LDLCALC 66  CHOLHDL 3.2     Hematology Recent Labs  Lab 03/08/22 1305 03/09/22 0252 03/11/22 0030  WBC 8.3 10.1 9.9  RBC 4.04* 4.36 4.18*  HGB 11.6* 12.5* 12.1*  HCT 36.9* 38.8* 37.7*  MCV 91.3 89.0 90.2  MCH 28.7 28.7 28.9  MCHC 31.4 32.2 32.1  RDW 15.0 15.0 14.8  PLT 140* 152 140*    Thyroid  Recent Labs  Lab 03/08/22 1305 03/08/22 1330  TSH  --  1.130  FREET4 0.88  --      BNP Recent Labs  Lab 03/08/22 1305  BNP 789.1*     DDimer No results for input(s): "DDIMER" in the last 168 hours.   Radiology    ECHOCARDIOGRAM COMPLETE  Result Date: 03/09/2022    ECHOCARDIOGRAM REPORT   Patient Name:   Bobby [name] Date of Exam: 03/09/2022 Medical Rec #:  [id number]       Height:       69.0 in Accession #:    [id number]     Weight:       243.0 lb Date of Birth:  Sep 15, 1945       BSA:          2.244 m Patient Age:    76 years       BP:           135/53 mmHg Patient Gender: M              HR:           50 bpm. Exam Location:  Inpatient Procedure: 2D Echo, Color Doppler, Cardiac Doppler and Intracardiac            Opacification Agent Indications:     Heart Block i44.1  History:         Patient has prior history of Echocardiogram examinations, most                  recent 09/07/2021. CHF, COPD; Risk Factors:Hypertension, Diabetes                  and Dyslipidemia.  Sonographer:     Raquel Sarna Senior RDCS Referring Phys:  [place] Diagnosing Phys: Oswaldo Milian MD  Sonographer Comments: Technically difficult due to lung interference, scanned supine due to temporary pacemaker IMPRESSIONS  1. Left ventricular ejection fraction, by estimation, is 30 to 35%. The left ventricle has moderately decreased function. The left ventricle demonstrates regional wall motion abnormalities (see scoring diagram/findings for description). Apical aneurysm.  The left ventricular internal cavity size was mildly dilated. There is mild left ventricular hypertrophy. Left ventricular diastolic parameters are indeterminate.  2. Right ventricular systolic function is normal. The right ventricular size is normal. There is moderately elevated pulmonary artery systolic pressure. The estimated right ventricular systolic pressure is 31.4 mmHg.  3. Right atrial size was mildly dilated.  4. The mitral valve is normal in structure. Mild mitral valve regurgitation.  5. The aortic valve is tricuspid. Aortic valve regurgitation is trivial. AV gradients were not measured  6. The inferior vena cava is dilated in size with <50% respiratory variability, suggesting right atrial pressure of 15 mmHg. FINDINGS  Left Ventricle: Left ventricular ejection fraction, by estimation, is 30 to 35%. The left ventricle has  moderately decreased function. The left ventricle demonstrates regional wall motion abnormalities. Definity contrast agent was given IV to delineate the left ventricular endocardial borders. The left ventricular internal cavity size was mildly dilated. There is mild left ventricular hypertrophy. Left ventricular diastolic parameters are indeterminate.  LV Wall Scoring: The mid and distal anterior wall, mid and distal anterior septum, mid inferoseptal segment, apical inferior segment, and apex are akinetic. The entire lateral wall, inferior wall, basal anteroseptal segment, basal anterior segment, and basal inferoseptal segment are normal. Right Ventricle: The right ventricular size is normal. No increase in right ventricular wall thickness. Right ventricular systolic function is normal. There is moderately elevated pulmonary artery systolic pressure. The tricuspid regurgitant velocity is 2.76 m/s, and with an assumed right atrial pressure of 15 mmHg, the estimated right ventricular systolic pressure is 35.5 mmHg. Left Atrium: Left atrial size was normal in size. Right Atrium: Right atrial size was mildly dilated. Pericardium: There is no evidence of pericardial effusion. Mitral Valve: The mitral valve is normal in structure. Mild mitral valve regurgitation. Tricuspid Valve: The tricuspid valve is normal in structure. Tricuspid valve regurgitation is trivial. Aortic Valve: The aortic valve is tricuspid. Aortic valve regurgitation is trivial. Aortic valve sclerosis is present, with no evidence of aortic valve stenosis. Pulmonic Valve: The pulmonic valve was not well visualized. Pulmonic valve regurgitation is not visualized. Aorta: The aortic root and ascending aorta are structurally normal, with no evidence of dilitation. Venous: The inferior vena cava is dilated in size with less than 50% respiratory variability, suggesting right atrial pressure of 15 mmHg. IAS/Shunts: The interatrial septum was not well visualized.   LEFT VENTRICLE PLAX 2D LVIDd:         5.80 cm LVIDs:         4.30 cm LV PW:         1.30 cm LV IVS:        1.00 cm LVOT diam:     2.20 cm LV SV:         98 LV SV Index:   44 LVOT Area:     3.80 cm  LV Volumes (MOD) LV vol d, MOD A4C: 164.0 ml LV vol s, MOD A4C: 104.0 ml LV SV MOD A4C:     164.0 ml RIGHT VENTRICLE RV S prime:     14.90 cm/s TAPSE (M-mode): 1.7 cm LEFT ATRIUM             Index        RIGHT ATRIUM           Index LA diam:        3.60 cm 1.60 cm/m   RA Area:     23.90 cm LA Vol (A2C):   84.1 ml 37.47 ml/m  RA Volume:   75.90 ml  33.82 ml/m LA Vol (A4C):   65.2 ml 29.05 ml/m LA Biplane Vol: 74.2 ml 33.06 ml/m  AORTIC VALVE LVOT Vmax:   111.00 cm/s LVOT Vmean:  77.100 cm/s LVOT VTI:    0.258 m  AORTA Ao Root diam: 3.40 cm Ao Asc diam:  3.30 cm TRICUSPID VALVE TR Peak grad:   30.5 mmHg TR Vmax:  276.00 cm/s  SHUNTS Systemic VTI:  0.26 m Systemic Diam: 2.20 cm Oswaldo Milian MD Electronically signed by Oswaldo Milian MD Signature Date/Time: 03/09/2022/1:27:03 PM    Final (Updated)     Cardiac Studies   TTE  1. Left ventricular ejection fraction, by estimation, is 30 to 35%. The  left ventricle has moderately decreased function. The left ventricle  demonstrates regional wall motion abnormalities (see scoring  diagram/findings for description). Apical aneurysm.   The left ventricular internal cavity size was mildly dilated. There is  mild left ventricular hypertrophy. Left ventricular diastolic parameters  are indeterminate.   2. Right ventricular systolic function is normal. The right ventricular  size is normal. There is moderately elevated pulmonary artery systolic  pressure. The estimated right ventricular systolic pressure is 94.8 mmHg.   3. Right atrial size was mildly dilated.   4. The mitral valve is normal in structure. Mild mitral valve  regurgitation.   5. The aortic valve is tricuspid. Aortic valve regurgitation is trivial.  AV gradients were not measured    6. The inferior vena cava is dilated in size with <50% respiratory  variability, suggesting right atrial pressure of 15 mmHg.   Patient Profile     76 y.o. male with a history of ischemic cardiomyopathy who presented to the hospital with complete heart block.  Assessment & Plan    1.  Complete heart block: Status post temporary wire implant.  Plan for CRT-D implant today.  Risk and benefits were discussed which include bleeding, tamponade, infection, pneumothorax, lead dislodgment, inappropriate shocks.  He understands these risks and is agreed to the procedure.  2.  Coronary artery disease: Status post multiple stents.  No current chest pain.  Chronic systolic heart failure: Ejection fraction 30 to 35%.  Plan CRT-D implant today.  We Nilo Fallin restart heart failure medications post implant.  4.  History of LV thrombus: Holding Eliquis for ICD implant  5.  Acute on chronic renal failure: Creatinine significantly elevated.  Continuing to diuresis.  Coury Grieger reassess post device implant.  For questions or updates, please contact [place] Please consult [email or web address] for contact info under        Signed, Yamaris Cummings Meredith Leeds, MD  03/11/2022, 7:18 AM

## 2022-03-12 ENCOUNTER — Inpatient Hospital Stay (HOSPITAL_COMMUNITY): Payer: Medicare Other

## 2022-03-12 ENCOUNTER — Encounter (HOSPITAL_COMMUNITY): Payer: Self-pay | Admitting: Cardiology

## 2022-03-12 DIAGNOSIS — N179 Acute kidney failure, unspecified: Secondary | ICD-10-CM | POA: Diagnosis not present

## 2022-03-12 DIAGNOSIS — I255 Ischemic cardiomyopathy: Secondary | ICD-10-CM | POA: Diagnosis not present

## 2022-03-12 DIAGNOSIS — I5023 Acute on chronic systolic (congestive) heart failure: Secondary | ICD-10-CM | POA: Diagnosis not present

## 2022-03-12 DIAGNOSIS — I441 Atrioventricular block, second degree: Secondary | ICD-10-CM | POA: Diagnosis not present

## 2022-03-12 LAB — BASIC METABOLIC PANEL
Anion gap: 8 (ref 5–15)
BUN: 31 mg/dL — ABNORMAL HIGH (ref 8–23)
CO2: 24 mmol/L (ref 22–32)
Calcium: 9.5 mg/dL (ref 8.9–10.3)
Chloride: 108 mmol/L (ref 98–111)
Creatinine, Ser: 1.97 mg/dL — ABNORMAL HIGH (ref 0.61–1.24)
GFR, Estimated: 35 mL/min — ABNORMAL LOW (ref >60)
Glucose, Bld: 126 mg/dL — ABNORMAL HIGH (ref 70–99)
Potassium: 4.2 mmol/L (ref 3.5–5.1)
Sodium: 140 mmol/L (ref 135–145)

## 2022-03-12 LAB — CBC
HCT: 36.7 % — ABNORMAL LOW (ref 39.0–52.0)
Hemoglobin: 11.8 g/dL — ABNORMAL LOW (ref 13.0–17.0)
MCH: 28.2 pg (ref 26.0–34.0)
MCHC: 32.2 g/dL (ref 30.0–36.0)
MCV: 87.8 fL (ref 80.0–100.0)
Platelets: 136 10*3/uL — ABNORMAL LOW (ref 150–400)
RBC: 4.18 MIL/uL — ABNORMAL LOW (ref 4.22–5.81)
RDW: 14.6 % (ref 11.5–15.5)
WBC: 10.8 10*3/uL — ABNORMAL HIGH (ref 4.0–10.5)
nRBC: 0 % (ref 0.0–0.2)

## 2022-03-12 MED ORDER — FUROSEMIDE 40 MG PO TABS
40.0000 mg | ORAL_TABLET | Freq: Every day | ORAL | Status: DC
  Administered 2022-03-12: 40 mg via ORAL
  Filled 2022-03-12: qty 1

## 2022-03-12 MED ORDER — HYDRALAZINE HCL 25 MG PO TABS
25.0000 mg | ORAL_TABLET | Freq: Three times a day (TID) | ORAL | Status: DC | PRN
  Administered 2022-03-12: 25 mg via ORAL
  Filled 2022-03-12: qty 1

## 2022-03-12 MED ORDER — HYDRALAZINE HCL 50 MG PO TABS: 50.0000 mg | ORAL_TABLET | Freq: Three times a day (TID) | ORAL | Status: DC

## 2022-03-12 MED ORDER — APIXABAN 5 MG PO TABS: 5.0000 mg | ORAL_TABLET | Freq: Two times a day (BID) | ORAL | 1 refills | Status: AC

## 2022-03-12 MED ORDER — ISOSORBIDE MONONITRATE ER 30 MG PO TB24: 30.0000 mg | ORAL_TABLET | Freq: Every day | ORAL | 6 refills | Status: DC

## 2022-03-12 MED ORDER — HYDRALAZINE HCL 50 MG PO TABS: 50.0000 mg | ORAL_TABLET | Freq: Three times a day (TID) | ORAL | 6 refills | Status: DC

## 2022-03-12 MED ORDER — ACETAMINOPHEN 325 MG PO TABS: 650.0000 mg | ORAL_TABLET | ORAL | Status: DC | PRN

## 2022-03-12 MED ORDER — ISOSORBIDE MONONITRATE ER 30 MG PO TB24
30.0000 mg | ORAL_TABLET | Freq: Every day | ORAL | Status: DC
  Administered 2022-03-12: 30 mg via ORAL
  Filled 2022-03-12: qty 1

## 2022-03-12 MED ORDER — ASPIRIN EC 81 MG PO TBEC: 81.0000 mg | DELAYED_RELEASE_TABLET | Freq: Every day | ORAL | 3 refills | Status: DC

## 2022-03-12 NOTE — Progress Notes (Signed)
Discharge teaching complete. Meds, diet, activity, follow up appointments and incision care reviewed and all questions answered. Copy of instructions given to patient and prescriptions sent to pharmacy.

## 2022-03-12 NOTE — Discharge Summary (Addendum)
ELECTROPHYSIOLOGY PROCEDURE DISCHARGE SUMMARY    Patient ID: Bobby [name],  MRN: [id number], DOB/AGE: 1946/08/15 76 y.o.  Admit date: 03/08/2022 Discharge date: 03/12/2022  Primary Care Physician: Hayden Rasmussen, MD  Primary Cardiologist: Shelva Majestic, MD  Electrophysiologist: Dr. Curt Bears  Primary Diagnosis:  Chronic systolic CHF   Secondary Diagnosis: ICM LBBB  Allergies  Allergen Reactions   Hydrocodone Itching     Procedures This Admission:  1.  Implantation of a St. Jude BiV ICD on 03/11/2022 by Dr. Curt Bears.  The patient received a Abbot Gallant [id number] (BiV)  with Abbott Tendril [id number] right atrial lead and Abbott Durata 7122 right ventricular lead. Pt received an Abbott Quartet [id number] LV lead. DFTs were deferred at time of implant There were no post procedure complications  2.  CXR on 03/12/22 demonstrated no pneumothorax status post device implantation.  Brief HPI: Bobby [name] is a 76 y.o. 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 Consulted  electrophysiology for consideration of ICD implantation given history of his CHF.  Past medical history includes above.  The patient has persistent LV dysfunction despite guideline directed therapy.  Risks, benefits, and alternatives to ICD implantation were reviewed with the patient who wished to proceed.   Hospital Course:  The patient was admitted and underwent implantation of a St. Jude BiV ICD with details as outlined above. They were monitored on telemetry overnight which demonstrated appropriate pacing .  Left chest was without hematoma or ecchymosis.  The device was interrogated and found to be functioning normally.  CXR was obtained and demonstrated no pneumothorax status post device implantation..  Wound care, arm mobility, and restrictions were reviewed with the patient.  The patient was examined and considered stable for discharge to home.   The patient's  discharge medications include beta blocker (coreg).   Anticoagulation resumption This patient should resume their Eliquis on Thursday, 03/15/2022 with the morning dose  Ok to resume ASA on Thursday.   Physical Exam: Vitals:   03/12/22 0304 03/12/22 0330 03/12/22 0551 03/12/22 0745  BP: (!) 185/86 (!) 180/105 (!) 177/93   Pulse: 72   75  Resp: 17   19  Temp: 97.6 F (36.4 C)     TempSrc: Oral     SpO2: 96%   95%  Weight:      Height:        GEN- The patient is well appearing, alert and oriented x 3 today.   HEENT: normocephalic, atraumatic; sclera clear, conjunctiva pink; hearing intact; oropharynx clear; neck supple, no JVP Lymph- no cervical lymphadenopathy Lungs- Clear to ausculation bilaterally, normal work of breathing.  No wheezes, rales, rhonchi Heart- Regular rate and rhythm, no murmurs, rubs or gallops, PMI not laterally displaced GI- soft, non-tender, non-distended, bowel sounds present, no hepatosplenomegaly Extremities- no clubbing, cyanosis, or edema; DP/PT/radial pulses 2+ bilaterally MS- no significant deformity or atrophy Skin- warm and dry, no rash or lesion. ICD site stable Psych- euthymic mood, full affect Neuro- strength and sensation are intact   Labs:   Lab Results  Component Value Date   WBC 10.8 (H) 03/12/2022   HGB 11.8 (L) 03/12/2022   HCT 36.7 (L) 03/12/2022   MCV 87.8 03/12/2022   PLT 136 (L) 03/12/2022    Recent Labs  Lab 03/08/22 1305 03/09/22 0252 03/12/22 0124  NA 138   < > 140  K 4.0   < > 4.2  CL 110   < >  108  CO2 20*   < > 24  BUN 45*   < > 31*  CREATININE 2.75*   < > 1.97*  CALCIUM 9.3   < > 9.5  PROT 6.2*  --   --   BILITOT 0.7  --   --   ALKPHOS 89  --   --   ALT 31  --   --   AST 20  --   --   GLUCOSE 144*   < > 126*   < > = values in this interval not displayed.    Discharge Medications:  Allergies as of 03/12/2022       Reactions   Hydrocodone Itching        Medication List     TAKE these medications     acetaminophen 325 MG tablet Commonly known as: TYLENOL Take 2 tablets (650 mg total) by mouth every 4 (four) hours as needed for headache or mild pain.   albuterol 108 (90 Base) MCG/ACT inhaler Commonly known as: VENTOLIN HFA Inhale 2 puffs into the lungs every 6 (six) hours as needed for wheezing or shortness of breath.   amLODipine 5 MG tablet Commonly known as: NORVASC TAKE ONE TABLET BY MOUTH DAILY   apixaban 5 MG Tabs tablet Commonly known as: Eliquis Take 1 tablet (5 mg total) by mouth 2 (two) times daily. Start taking on: March 15, 2022 What changed: These instructions start on March 15, 2022. If you are unsure what to do until then, ask your doctor or other care provider.   aspirin EC 81 MG tablet Take 1 tablet (81 mg total) by mouth daily. Swallow whole. Start taking on: March 15, 2022 What changed: These instructions start on March 15, 2022. If you are unsure what to do until then, ask your doctor or other care provider.   atorvastatin 80 MG tablet Commonly known as: LIPITOR Take 1 tablet (80 mg total) by mouth daily at 6 PM.   Breztri Aerosphere 160-9-4.8 MCG/ACT Aero Generic drug: Budeson-Glycopyrrol-Formoterol Inhale 2 puffs into the lungs 2 (two) times daily.   buPROPion 300 MG 24 hr tablet Commonly known as: WELLBUTRIN XL Take 300 mg by mouth daily.   carvedilol 25 MG tablet Commonly known as: COREG TAKE ONE TABLET BY MOUTH TWICE DAILY What changed: when to take this   clonazePAM 1 MG tablet Commonly known as: KLONOPIN Take 1 mg by mouth 2 (two) times daily as needed for anxiety.   Colcrys 0.6 MG tablet Generic drug: colchicine Take 0.6 mg by mouth daily.   COQ10 PO Take 1 capsule by mouth daily.   doxycycline 100 MG capsule Commonly known as: VIBRAMYCIN Take 100 mg by mouth 2 (two) times daily.   ferrous sulfate 325 (65 FE) MG EC tablet Take 325 mg by mouth 2 (two) times a week.   FISH OIL PO Take 1 tablet by mouth daily.   furosemide 40 MG  tablet Commonly known as: LASIX Take 40 mg by mouth daily.   hydrALAZINE 50 MG tablet Commonly known as: APRESOLINE Take 1 tablet (50 mg total) by mouth every 8 (eight) hours. What changed:  medication strength how much to take when to take this   isosorbide mononitrate 30 MG 24 hr tablet Commonly known as: IMDUR Take 1 tablet (30 mg total) by mouth daily.   nitroGLYCERIN 0.4 MG SL tablet Commonly known as: NITROSTAT DISSOLVE 1 TABLET UNDER THE TONGUE EVERY 5 MINUTES AS NEEDED FOR CHEST PAIN. DO NOT EXCEED A  TOTAL OF 3 DOSES IN 15 MINUTES. What changed: See the new instructions.   oxyCODONE-acetaminophen 10-325 MG tablet Commonly known as: PERCOCET Take 1 tablet by mouth every 6 (six) hours as needed for pain.   predniSONE 10 MG tablet Commonly known as: DELTASONE Take 10 mg by mouth daily.   sertraline 100 MG tablet Commonly known as: ZOLOFT Take 100 mg by mouth daily.   Vitamin D (Ergocalciferol) 1.25 MG (50000 UNIT) Caps capsule Commonly known as: DRISDOL Take 50,000 Units by mouth 2 (two) times a week.        Disposition:    Follow-up Information     [facility] Follow up.   Why: on 7/20 at 12 noon for post defibrillator wound check Contact information: [place] [id number] [phone]                Duration of Discharge Encounter: Greater than 30 minutes including physician time.  Signed, Stevie Charter, PA-C  03/12/2022 8:11 AM    I have seen and examined this patient with Oda Kilts.  Agree with above, note added to reflect my findings.  Patient presented to the hospital with second-degree AV block.  Due to prolonged pauses and severe bradycardia, temporary wire was placed.  Carvedilol was washed out, but he continued to have heart block.  He is now status post Abbott CRT-D due to ischemic cardiomyopathy and heart block.  He was in acute renal  failure, but kidney function has improved.  We Nakyiah Kuck hold Eliquis for 4 days.  Plan for discharge today with follow-up in clinic.  GEN: Well nourished, well developed, in no acute distress  HEENT: normal  Neck: no JVD, carotid bruits, or masses Cardiac: RRR; no murmurs, rubs, or gallops,no edema  Respiratory:  clear to auscultation bilaterally, normal work of breathing GI: soft, nontender, nondistended, + BS MS: no deformity or atrophy  Skin: warm and dry, device site well healed Neuro:  Strength and sensation are intact Psych: euthymic mood, full affect     Orrin Yurkovich M. Tama Grosz MD 03/12/2022 8:17 AM

## 2022-03-22 ENCOUNTER — Ambulatory Visit (INDEPENDENT_AMBULATORY_CARE_PROVIDER_SITE_OTHER): Payer: Medicare Other

## 2022-03-22 DIAGNOSIS — I5023 Acute on chronic systolic (congestive) heart failure: Secondary | ICD-10-CM

## 2022-03-22 DIAGNOSIS — I4510 Unspecified right bundle-branch block: Secondary | ICD-10-CM | POA: Diagnosis not present

## 2022-03-22 NOTE — Patient Instructions (Addendum)
   After Your ICD (Implantable Cardiac Defibrillator)    Monitor your defibrillator site for redness, swelling, and drainage. Call the device clinic at [phone] if you experience these symptoms or fever/chills.  Your incision was closed with Steri-strips or staples:  You may shower 7 days after your procedure and wash your incision with soap and water. Avoid lotions, ointments, or perfumes over your incision until it is well-healed.  You may use a hot tub or a pool after your wound check appointment if the incision is completely closed.  Do not lift, push or pull greater than 10 pounds with the affected arm until April 23 2022 . There are no other restrictions in arm movement after your wound check appointment.   Your ICD is designed to protect you from life threatening heart rhythms. Because of this, you may receive a shock.   1 shock with no symptoms:  Call the office during business hours. 1 shock with symptoms (chest pain, chest pressure, dizziness, lightheadedness, shortness of breath, overall feeling unwell):  Call 911. If you experience 2 or more shocks in 24 hours:  Call 911. If you receive a shock, you should not drive.  [place] DMV - no driving for 6 months if you receive appropriate therapy from your ICD.   ICD Alerts:  Some alerts are vibratory and others beep. These are NOT emergencies. Please call our office to let us know. If this occurs at night or on weekends, it can wait until the next business day. Send a remote transmission.  If your device is capable of reading fluid status (for heart failure), you will be offered monthly monitoring to review this with you.   Remote monitoring is used to monitor your ICD from home. This monitoring is scheduled every 91 days by our office. It allows [place] to keep an eye on the functioning of your device to ensure it is working properly. You will routinely see your Electrophysiologist annually (more often if necessary).

## 2022-03-22 NOTE — Progress Notes (Signed)

## 2022-03-28 ENCOUNTER — Telehealth: Payer: Self-pay | Admitting: Cardiovascular Disease

## 2022-03-28 NOTE — Telephone Encounter (Signed)
Divia with [facility] is requesting an updated medication list for the patient.  Phone#: [phone] Fax#: [phone]

## 2022-03-28 NOTE — Telephone Encounter (Signed)
Verified fax number to [facility]. Med list faxed.

## 2022-04-05 NOTE — Progress Notes (Signed)
Cardiology Clinic Note   Patient Name: Bobby [name] Date of Encounter: 04/06/2022  Primary Care Provider:  Hayden Rasmussen, MD Primary Cardiologist:  Shelva Majestic, MD Electrophysiologist: [facility]   Patient Profile     76 y.o. 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 Consulted electrophysiology for consideration of ICD implantation given history of his CHF on 03/08/2022. History of CAD with DES to prox LAD to Mid LAD, DES to mid Cx and 1st OM in 2019, LBBB, ischemic CM with EF of 30-35%, chronic back pain, Type II diabetes, hyperlipidemia, Hypertensive heart disease, pulmonary hypertension, CVA, and tobacco abuse.  Discharged on 03/12/2022 after implantation of ST Jude BiV ICD.   Past Medical History    Past Medical History:  Diagnosis Date   DM (diabetes mellitus), type 2 new diagnosis 08/05/2012   diet control   ED (erectile dysfunction)    surgery planned   Hyperlipidemia 08/05/2012   Hypertensive heart disease    Panic attack    Pulmonary HTN ([place])    echo 08/05/12, EF 55-60%, PA pressure 11mm   S/P coronary artery stent placement, to RCA Promus DES 08/05/2012   Stroke ([place])    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   Tobacco abuse    Past Surgical History:  Procedure Laterality Date   BIV ICD INSERTION CRT-D N/A 03/11/2022   Procedure: BIV ICD INSERTION CRT-D;  Surgeon: Constance Haw, MD;  Location: [place] [facility];  Service: Cardiovascular;  Laterality: N/A;   CARDIAC CATHETERIZATION  08/04/12   PCI to RCA with DES   CORONARY STENT INTERVENTION N/A 08/12/2017   Procedure: CORONARY STENT INTERVENTION;  Surgeon: Martinique, Peter M, MD;  Location: [place] [facility];  Service: Cardiovascular;  Laterality: N/A;   CORONARY/GRAFT ACUTE MI REVASCULARIZATION N/A 08/09/2017   Procedure: Coronary/Graft Acute MI Revascularization;  Surgeon: Belva Crome, MD;  Location: [place] [facility];  Service: Cardiovascular;  Laterality: N/A;    LEFT HEART CATH AND CORONARY ANGIOGRAPHY N/A 08/09/2017   Procedure: LEFT HEART CATH AND CORONARY ANGIOGRAPHY;  Surgeon: Belva Crome, MD;  Location: [place] [facility];  Service: Cardiovascular;  Laterality: N/A;   LEFT HEART CATHETERIZATION WITH CORONARY ANGIOGRAM N/A 08/04/2012   Procedure: LEFT HEART CATHETERIZATION WITH CORONARY ANGIOGRAM;  Surgeon: Troy Sine, MD;  Location: [facility];  Service: Cardiovascular;  Laterality: N/A;   LEG SURGERY Left    rod placed for fracture repair   PENILE PROSTHESIS IMPLANT N/A 06/03/2014   Procedure: IMPLANT PENILE PROTHESIS INFLATABLE;  Surgeon: Ailene Rud, MD;  Location: [facility];  Service: Urology;  Laterality: N/A;  with penile block--0.5% marcaine plain   PERCUTANEOUS CORONARY STENT INTERVENTION (PCI-S) Right 08/04/2012   Procedure: PERCUTANEOUS CORONARY STENT INTERVENTION (PCI-S);  Surgeon: Troy Sine, MD;  Location: [facility] CATH LAB;  Service: Cardiovascular;  Laterality: Right;   TEMPORARY PACEMAKER N/A 03/08/2022   Procedure: TEMPORARY PACEMAKER;  Surgeon: Belva Crome, MD;  Location: [place] [facility];  Service: Cardiovascular;  Laterality: N/A;   THUMB ARTHROSCOPY Left    thumb joint replaced    Allergies  Allergies  Allergen Reactions   Hydrocodone Itching    History of Present Illness    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 Jude ICD BiV PPM. EF of 30-35% per echo on 03/09/2022, CHF, ischemic  CM with CAD and other history as described above. He was to resume Eliquis on July 13,2023.    [name] comes today without any complaints.  In fact he states he is feeling much better, breathing better, no PND orthopnea or edema.  No further episodes of syncope or dizziness.  His sister is his caretaker and she make sure that his medications are taken daily and that his pills are  divided up correctly in his pill container.  He has been very compliant daily weights and avoiding salt.  He denies any pain at the pacemaker insertion site.  Home Medications    Current Outpatient Medications  Medication Sig Dispense Refill   amLODipine (NORVASC) 5 MG tablet TAKE ONE TABLET BY MOUTH DAILY (Patient taking differently: Take 5 mg by mouth daily.) 180 tablet 3   apixaban (ELIQUIS) 5 MG TABS tablet Take 1 tablet (5 mg total) by mouth 2 (two) times daily. 28 tablet 1   aspirin EC 81 MG tablet Take 1 tablet (81 mg total) by mouth daily. Swallow whole. 90 tablet 3   atorvastatin (LIPITOR) 80 MG tablet Take 1 tablet (80 mg total) by mouth daily at 6 PM. 90 tablet 3   buPROPion (WELLBUTRIN XL) 300 MG 24 hr tablet Take 300 mg by mouth daily.     carvedilol (COREG) 25 MG tablet TAKE ONE TABLET BY MOUTH TWICE DAILY (Patient taking differently: Take 25 mg by mouth 2 (two) times daily with a meal.) 180 tablet 3   clonazePAM (KLONOPIN) 1 MG tablet Take 1 mg by mouth 2 (two) times daily as needed for anxiety.     Coenzyme Q10 (COQ10 PO) Take 1 capsule by mouth daily.     ferrous sulfate 325 (65 FE) MG EC tablet Take 325 mg by mouth 2 (two) times a week.     furosemide (LASIX) 40 MG tablet Take 40 mg by mouth daily.     hydrALAZINE (APRESOLINE) 50 MG tablet Take 1 tablet (50 mg total) by mouth every 8 (eight) hours. 90 tablet 6   isosorbide mononitrate (IMDUR) 30 MG 24 hr tablet Take 1 tablet (30 mg total) by mouth daily. 30 tablet 6   Omega-3 Fatty Acids (FISH OIL PO) Take 1 tablet by mouth daily.     oxyCODONE-acetaminophen (PERCOCET) 10-325 MG per tablet Take 1 tablet by mouth every 6 (six) hours as needed for pain.     sertraline (ZOLOFT) 100 MG tablet Take 100 mg by mouth daily.     TRELEGY ELLIPTA 200-62.5-25 MCG/ACT AEPB Inhale 1 puff into the lungs daily.     Vitamin D, Ergocalciferol, (DRISDOL) 1.25 MG (50000 UNIT) CAPS capsule Take 50,000 Units by mouth 2 (two) times a week.      acetaminophen (TYLENOL) 325 MG tablet Take 2 tablets (650 mg total) by mouth every 4 (four) hours as needed for headache or mild pain. (Patient not taking: Reported on 04/06/2022)     albuterol (PROVENTIL HFA;VENTOLIN HFA) 108 (90 Base) MCG/ACT inhaler Inhale 2 puffs into the lungs every 6 (six) hours as needed for wheezing or shortness of breath. (Patient not taking: Reported on 04/06/2022) 1 Inhaler 2   BREZTRI AEROSPHERE 160-9-4.8 MCG/ACT AERO Inhale 2 puffs into the lungs 2 (two) times daily. (Patient not taking: Reported on 04/06/2022)     COLCRYS 0.6 MG tablet Take 0.6 mg by mouth daily. (Patient not taking: Reported on 04/06/2022)  3   doxycycline (VIBRAMYCIN) 100 MG capsule Take 100 mg by mouth 2 (two)  times daily. (Patient not taking: Reported on 04/06/2022)     nitroGLYCERIN (NITROSTAT) 0.4 MG SL tablet DISSOLVE 1 TABLET UNDER THE TONGUE EVERY 5 MINUTES AS NEEDED FOR CHEST PAIN. DO NOT EXCEED A TOTAL OF 3 DOSES IN 15 MINUTES. (Patient not taking: Reported on 04/06/2022) 90 tablet 3   predniSONE (DELTASONE) 10 MG tablet Take 10 mg by mouth daily. (Patient not taking: Reported on 04/06/2022)     No current facility-administered medications for this visit.     Family History    Family History  Problem Relation Age of Onset   Kidney disease Father        kidney disease and heart disease   Heart disease Maternal Grandmother    Stomach cancer Maternal Grandfather    Heart attack Paternal Grandmother    He indicated that his mother is alive. He indicated that his father is deceased. He indicated that his sister is alive. He indicated that his brother is deceased. He indicated that his maternal grandmother is deceased. He indicated that his maternal grandfather is deceased. He indicated that his paternal grandmother is deceased.  Social History    Social History   Socioeconomic History   Marital status: Legally Separated    Spouse name: Not on file   Number of children: Not on file   Years of  education: Not on file   Highest education level: Not on file  Occupational History   Not on file  Tobacco Use   Smoking status: Every Day    Packs/day: 0.75    Years: 60.00    Total pack years: 45.00    Types: Cigarettes   Smokeless tobacco: Never   Tobacco comments:    Currently smoking about .5ppd as of 06/14/21 ep    Patient smokes 1/2 pack daily 04/06/2022  Substance and Sexual Activity   Alcohol use: Not Currently    Comment: ocasionally   Drug use: No   Sexual activity: Not on file  Other Topics Concern   Not on file  Social History Narrative   Not on file   Social Determinants of Health   Financial Resource Strain: Not on file  Food Insecurity: Not on file  Transportation Needs: Not on file  Physical Activity: Not on file  Stress: Not on file  Social Connections: Not on file  Intimate Partner Violence: Not on file     Review of Systems    General:  No chills, fever, night sweats or weight changes.  Cardiovascular:  No chest pain, dyspnea on exertion, edema, orthopnea, palpitations, paroxysmal nocturnal dyspnea. Dermatological: No rash, lesions/masses Respiratory: No cough, dyspnea Urologic: No hematuria, dysuria Abdominal:   No nausea, vomiting, diarrhea, bright red blood per rectum, melena, or hematemesis Neurologic:  No visual changes, wkns, changes in mental status. All other systems reviewed and are otherwise negative except as noted above.     Physical Exam    VS:  BP (!) 126/58 (BP Location: Left Arm, Patient Position: Sitting, Cuff Size: Large)   Pulse 71   Ht 5\' 9"  (1.753 m)   Wt 242 lb 9.6 oz (110 kg)   SpO2 95%   BMI 35.83 kg/m  , BMI Body mass index is 35.83 kg/m.     GEN: Well nourished, well developed, in no acute distress. HEENT: normal.  Poor dentition Neck: Supple, no JVD, carotid bruits, or masses. Cardiac: RRR, no murmurs, rubs, or gallops. No clubbing, cyanosis, edema.  Radials/DP/PT 2+ and equal bilaterally.  Respiratory:   Respirations  regular and unlabored, clear to auscultation bilaterally, except for bases with some mild crackling.  No expiratory wheezes GI: Soft, nontender, nondistended, BS + x 4. MS: no deformity or atrophy.  Left upper chest pacemaker incision site is well-healed without evidence of infection hematoma or bleeding. Skin: warm and dry, no rash. Neuro:  Strength and sensation are intact. Psych: Normal affect.  Accessory Clinical Findings    ECG personally reviewed by me today-not completed this office visit.  Lab Results  Component Value Date   WBC 10.8 (H) 03/12/2022   HGB 11.8 (L) 03/12/2022   HCT 36.7 (L) 03/12/2022   MCV 87.8 03/12/2022   PLT 136 (L) 03/12/2022   Lab Results  Component Value Date   CREATININE 1.97 (H) 03/12/2022   BUN 31 (H) 03/12/2022   NA 140 03/12/2022   K 4.2 03/12/2022   CL 108 03/12/2022   CO2 24 03/12/2022   Lab Results  Component Value Date   ALT 31 03/08/2022   AST 20 03/08/2022   ALKPHOS 89 03/08/2022   BILITOT 0.7 03/08/2022   Lab Results  Component Value Date   CHOL 123 03/09/2022   HDL 39 (L) 03/09/2022   LDLCALC 66 03/09/2022   TRIG 89 03/09/2022   CHOLHDL 3.2 03/09/2022    Lab Results  Component Value Date   HGBA1C 5.5 09/07/2021    Review of Prior Studies: Procedure 03/11/2022  1.  Implantation of a St. Jude BiV ICD on 03/11/2022 by Dr. Curt Bears.  The patient received a Abbot Gallant [id number] (BiV)  with Abbott Tendril [id number] right atrial lead and Abbott Durata 7122 right ventricular lead. Pt received an Abbott Quartet [id number] LV lead. DFTs were deferred at time of implant There were no post procedure complications  2.  CXR on 03/12/22 demonstrated no pneumothorax status post device implantation.  Echocardiogram 03/09/2022  1. Left ventricular ejection fraction, by estimation, is 30 to 35%. The  left ventricle has moderately decreased function. The left ventricle  demonstrates regional wall motion abnormalities (see scoring   diagram/findings for description). Apical aneurysm.   The left ventricular internal cavity size was mildly dilated. There is  mild left ventricular hypertrophy. Left ventricular diastolic parameters  are indeterminate.   2. Right ventricular systolic function is normal. The right ventricular  size is normal. There is moderately elevated pulmonary artery systolic  pressure. The estimated right ventricular systolic pressure is 74.1 mmHg.   3. Right atrial size was mildly dilated.   4. The mitral valve is normal in structure. Mild mitral valve  regurgitation.   5. The aortic valve is tricuspid. Aortic valve regurgitation is trivial.  AV gradients were not measured   6. The inferior vena cava is dilated in size with <50% respiratory  variability, suggesting right atrial pressure of 15 mmHg.   LHC with intervention 08/12/2017 Previously placed Prox LAD to Mid LAD stent (unknown type) is widely patent. Mid Cx lesion is 99% stenosed. A drug-eluting stent was successfully placed using a STENT SIERRA 3.00 X 28 MM. Post intervention, there is a 0% residual stenosis. Ost 1st Mrg to 1st Mrg lesion is 85% stenosed. A drug-eluting stent was successfully placed using a STENT RESOLUTE ONYX 3.0X18. Post intervention, there is a 0% residual stenosis.  LHC 08/09/2017 Acute anteroapical ST elevation myocardial infarction due to acute occlusion of the mid LAD at the second diagonal.  Diffusely diseased LAD with subtotal occlusion occurring in a longer segment of involvement Left dominant coronary anatomy. Widely patent left main  coronary artery Dominant circumflex with 99% mid vessel stenosis and 80% stenosis in a large first obtuse marginal branch Widely patent nondominant right coronary previously stented without evidence of restenosis or obstruction. Successful culprit PCI of the LAD using a 2 5 x 22 mm Onyx DES postdilated to 3 mm in diameter with TIMI grade III flow.  The second diagonal was jailed  but maintained TIMI grade III flow.  There is mild to moderate disease proximal and distal to the stent.  TIMI grade III flow was noted.     RECOMMENDATIONS:   Staged PCI of OM1 and mid circumflex  Assessment & Plan   1.  High-grade AV block: Status post Saint Jude ICD BiV permanent pacemaker.  He is aware of remote checks that are pending.  He is not have any complaints about pacemaker site.  He has not had any discharges.  He will follow-up with pacemaker clinic as directed on appointments with next in person with Dr. Curt Bears on June 13, 2022.  2.  Ischemic cardiomyopathy: EF of 3 30% to 35%, remains on guideline directed medical therapy, may need to be considered for Farxiga 10 mg daily on follow-up.  Otherwise continue carvedilol, furosemide, blood pressure control with hydralazine and isosorbide.  3.  Coronary artery disease: History of DES to the proximal to mid LAD, DES to mid circumflex, and first OM in 2019.  Secondary prevention with blood pressure control, statin therapy, weight loss, and increased activity.  4.  History of CVA: Remains on Eliquis 5 mg twice daily.  No complaints of bleeding.  No neurodeficits.  5.  Polypharmacy: Would recommend review by pharmacist when appointment is available.  It is noted that he is on bupropion 300 mg daily as well as sertraline 100 mg daily.  Being on 2 SSRIs increases the risk of serotonin syndrome.  These may need to be reviewed by PCP on follow-up.  6.  Hyperlipidemia: Remains on atorvastatin 80 mg daily with goal of LDL less than than 50 as he is high risk.    Current medicines are reviewed at length with the patient today.  I have spent 25 min's  dedicated to the care of this patient on the date of this encounter to include pre-visit review of records, assessment, management and diagnostic testing,with shared decision making. Signed, Phill Myron. West Pugh, ANP, AACC   04/06/2022 8:17 AM      Office [phone] Fax [phone]  Notice: This dictation was prepared with Dragon dictation along with smaller phrase technology. Any transcriptional errors that result from this process are unintentional and may not be corrected upon review.

## 2022-04-06 ENCOUNTER — Ambulatory Visit (INDEPENDENT_AMBULATORY_CARE_PROVIDER_SITE_OTHER): Payer: Medicare Other | Admitting: Adult Health

## 2022-04-06 ENCOUNTER — Encounter: Payer: Self-pay | Admitting: Adult Health

## 2022-04-06 VITALS — BP 126/58 | HR 71 | Ht 69.0 in | Wt 242.6 lb

## 2022-04-06 DIAGNOSIS — E785 Hyperlipidemia, unspecified: Secondary | ICD-10-CM

## 2022-04-06 DIAGNOSIS — E1159 Type 2 diabetes mellitus with other circulatory complications: Secondary | ICD-10-CM

## 2022-04-06 DIAGNOSIS — I10 Essential (primary) hypertension: Secondary | ICD-10-CM

## 2022-04-06 DIAGNOSIS — I43 Cardiomyopathy in diseases classified elsewhere: Secondary | ICD-10-CM

## 2022-04-06 DIAGNOSIS — I5022 Chronic systolic (congestive) heart failure: Secondary | ICD-10-CM

## 2022-04-06 DIAGNOSIS — Z720 Tobacco use: Secondary | ICD-10-CM

## 2022-04-06 NOTE — Patient Instructions (Signed)
Medication Instructions:  No Changes *If you need a refill on your cardiac medications before your next appointment, please call your pharmacy*   Lab Work: No Labs If you have labs (blood work) drawn today and your tests are completely normal, you will receive your results only by: [place] (if you have MyChart) OR A paper copy in the mail If you have any lab test that is abnormal or we need to change your treatment, we will call you to review the results.   Testing/Procedures: No Testing   Follow-Up: At [facility], you and your health needs are our priority.  As part of our continuing mission to provide you with exceptional heart care, we have created designated Provider Care Teams.  These Care Teams include your primary Cardiologist (physician) and Advanced Practice Providers (APPs -  Physician Assistants and Nurse Practitioners) who all work together to provide you with the care you need, when you need it.  We recommend signing up for the patient portal called "MyChart".  Sign up information is provided on this After Visit Summary.  MyChart is used to connect with patients for Virtual Visits (Telemedicine).  Patients are able to view lab/test results, encounter notes, upcoming appointments, etc.  Non-urgent messages can be sent to your provider as well.   To learn more about what you can do with MyChart, go to [email or web address].    Your next appointment:   Keep Scheduled Appointment  The format for your next appointment:   In Person  Provider:   Shelva Majestic, MD

## 2022-06-11 ENCOUNTER — Ambulatory Visit: Payer: Medicare Other | Attending: Cardiology

## 2022-06-11 DIAGNOSIS — I5022 Chronic systolic (congestive) heart failure: Secondary | ICD-10-CM

## 2022-06-12 LAB — CUP PACEART REMOTE DEVICE CHECK
Battery Remaining Longevity: 65 mo
Battery Remaining Percentage: 91 %
Battery Voltage: 2.99 V
Brady Statistic AP VP Percent: 12 %
Brady Statistic AP VS Percent: 1 %
Brady Statistic AS VP Percent: 88 %
Brady Statistic AS VS Percent: 1 %
Brady Statistic RA Percent Paced: 12 %
Date Time Interrogation Session: 20231009020031
HighPow Impedance: 68 Ohm
Implantable Lead Implant Date: 20230709
Implantable Lead Implant Date: 20230709
Implantable Lead Implant Date: 20230709
Implantable Lead Location: 753858
Implantable Lead Location: 753859
Implantable Lead Location: 753860
Implantable Pulse Generator Implant Date: 20230709
Lead Channel Impedance Value: 430 Ohm
Lead Channel Impedance Value: 550 Ohm
Lead Channel Impedance Value: 750 Ohm
Lead Channel Pacing Threshold Amplitude: 0.625 V
Lead Channel Pacing Threshold Amplitude: 1.25 V
Lead Channel Pacing Threshold Amplitude: 2.25 V
Lead Channel Pacing Threshold Pulse Width: 0.5 ms
Lead Channel Pacing Threshold Pulse Width: 0.5 ms
Lead Channel Pacing Threshold Pulse Width: 0.5 ms
Lead Channel Sensing Intrinsic Amplitude: 12 mV
Lead Channel Sensing Intrinsic Amplitude: 3.1 mV
Lead Channel Setting Pacing Amplitude: 1.625
Lead Channel Setting Pacing Amplitude: 2.75 V
Lead Channel Setting Pacing Amplitude: 3.5 V
Lead Channel Setting Pacing Pulse Width: 0.5 ms
Lead Channel Setting Pacing Pulse Width: 0.5 ms
Lead Channel Setting Sensing Sensitivity: 0.5 mV
Pulse Gen Serial Number: 810036011

## 2022-06-13 ENCOUNTER — Encounter: Payer: Self-pay | Admitting: Cardiology

## 2022-06-13 ENCOUNTER — Ambulatory Visit: Payer: Medicare Other | Attending: Cardiology | Admitting: Cardiology

## 2022-06-13 VITALS — BP 110/66 | HR 60 | Ht 69.0 in | Wt 239.8 lb

## 2022-06-13 DIAGNOSIS — I5022 Chronic systolic (congestive) heart failure: Secondary | ICD-10-CM

## 2022-06-13 NOTE — Progress Notes (Signed)
Electrophysiology Office Note   Date:  06/13/2022   ID:  Bobby [name], DOB Jan 30, 1946, MRN [id number]  PCP:  Hayden Rasmussen, MD  Cardiologist:  Claiborne Billings Primary Electrophysiologist:  Charlii Yost Meredith Leeds, MD    Chief Complaint: ICD   History of Present Illness: Bobby [name] is a 76 y.o. male who is being seen today for the evaluation of ICD at the request of Hayden Rasmussen, MD. Presenting today for electrophysiology evaluation.  He has a history significant for type 2 diabetes, hypertension, hyperlipidemia, CVA, chronic systolic heart failure.  He presented to the hospital 03/09/2022 after an episode of syncope.  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  Due to his history of heart failure, he is now status post Abbott CRT-D implanted 03/11/2022.  Today, he denies symptoms of palpitations, chest pain, shortness of breath, orthopnea, PND, lower extremity edema, claudication, dizziness, presyncope, syncope, bleeding, or neurologic sequela. The patient is tolerating medications without difficulties.    Past Medical History:  Diagnosis Date   DM (diabetes mellitus), type 2 new diagnosis 08/05/2012   diet control   ED (erectile dysfunction)    surgery planned   Hyperlipidemia 08/05/2012   Hypertensive heart disease    Panic attack    Pulmonary HTN ([place])    echo 08/05/12, EF 55-60%, PA pressure 26mm   S/P coronary artery stent placement, to RCA Promus DES 08/05/2012   Stroke ([place])    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   Tobacco abuse    Past Surgical History:  Procedure Laterality Date   BIV ICD INSERTION CRT-D N/A 03/11/2022   Procedure: BIV ICD INSERTION CRT-D;  Surgeon: Constance Haw, MD;  Location: [facility];  Service: Cardiovascular;  Laterality: N/A;   CARDIAC CATHETERIZATION  08/04/12   PCI to RCA with DES   CORONARY STENT INTERVENTION N/A 08/12/2017   Procedure: CORONARY STENT INTERVENTION;  Surgeon: Martinique, Peter M, MD;  Location: [facility];  Service:  Cardiovascular;  Laterality: N/A;   CORONARY/GRAFT ACUTE MI REVASCULARIZATION N/A 08/09/2017   Procedure: Coronary/Graft Acute MI Revascularization;  Surgeon: Belva Crome, MD;  Location: [facility];  Service: Cardiovascular;  Laterality: N/A;   LEFT HEART CATH AND CORONARY ANGIOGRAPHY N/A 08/09/2017   Procedure: LEFT HEART CATH AND CORONARY ANGIOGRAPHY;  Surgeon: Belva Crome, MD;  Location: [facility];  Service: Cardiovascular;  Laterality: N/A;   LEFT HEART CATHETERIZATION WITH CORONARY ANGIOGRAM N/A 08/04/2012   Procedure: LEFT HEART CATHETERIZATION WITH CORONARY ANGIOGRAM;  Surgeon: Troy Sine, MD;  Location: [facility];  Service: Cardiovascular;  Laterality: N/A;   LEG SURGERY Left    rod placed for fracture repair   PENILE PROSTHESIS IMPLANT N/A 06/03/2014   Procedure: IMPLANT PENILE PROTHESIS INFLATABLE;  Surgeon: Ailene Rud, MD;  Location: [facility];  Service: Urology;  Laterality: N/A;  with penile block--0.5% marcaine plain   PERCUTANEOUS CORONARY STENT INTERVENTION (PCI-S) Right 08/04/2012   Procedure: PERCUTANEOUS CORONARY STENT INTERVENTION (PCI-S);  Surgeon: Troy Sine, MD;  Location: [facility];  Service: Cardiovascular;  Laterality: Right;   TEMPORARY PACEMAKER N/A 03/08/2022   Procedure: TEMPORARY PACEMAKER;  Surgeon: Belva Crome, MD;  Location: [facility];  Service: Cardiovascular;  Laterality: N/A;   THUMB ARTHROSCOPY Left    thumb joint replaced     Current Outpatient Medications  Medication Sig Dispense Refill   acetaminophen (TYLENOL) 325 MG tablet Take 2 tablets (650 mg total) by  mouth every 4 (four) hours as needed for headache or mild pain.     albuterol (PROVENTIL HFA;VENTOLIN HFA) 108 (90 Base) MCG/ACT inhaler Inhale 2 puffs into the lungs every 6 (six) hours as needed for wheezing or shortness of breath. 1 Inhaler 2   amLODipine (NORVASC) 5 MG tablet TAKE ONE TABLET BY MOUTH DAILY 180 tablet 3   apixaban (ELIQUIS) 5 MG  TABS tablet Take 1 tablet (5 mg total) by mouth 2 (two) times daily. 28 tablet 1   aspirin EC 81 MG tablet Take 1 tablet (81 mg total) by mouth daily. Swallow whole. 90 tablet 3   atorvastatin (LIPITOR) 80 MG tablet Take 1 tablet (80 mg total) by mouth daily at 6 PM. 90 tablet 3   BREZTRI AEROSPHERE 160-9-4.8 MCG/ACT AERO Inhale 2 puffs into the lungs 2 (two) times daily.     buPROPion (WELLBUTRIN XL) 300 MG 24 hr tablet Take 300 mg by mouth daily.     carvedilol (COREG) 25 MG tablet TAKE ONE TABLET BY MOUTH TWICE DAILY (Patient taking differently: Take 25 mg by mouth 2 (two) times daily with a meal.) 180 tablet 3   clonazePAM (KLONOPIN) 1 MG tablet Take 1 mg by mouth 2 (two) times daily as needed for anxiety.     Coenzyme Q10 (COQ10 PO) Take 1 capsule by mouth daily.     COLCRYS 0.6 MG tablet Take 0.6 mg by mouth daily.  3   doxycycline (VIBRAMYCIN) 100 MG capsule Take 100 mg by mouth 2 (two) times daily.     ferrous sulfate 325 (65 FE) MG EC tablet Take 325 mg by mouth 2 (two) times a week.     furosemide (LASIX) 40 MG tablet Take 40 mg by mouth daily.     hydrALAZINE (APRESOLINE) 50 MG tablet Take 1 tablet (50 mg total) by mouth every 8 (eight) hours. 90 tablet 6   isosorbide mononitrate (IMDUR) 30 MG 24 hr tablet Take 1 tablet (30 mg total) by mouth daily. 30 tablet 6   nitroGLYCERIN (NITROSTAT) 0.4 MG SL tablet DISSOLVE 1 TABLET UNDER THE TONGUE EVERY 5 MINUTES AS NEEDED FOR CHEST PAIN. DO NOT EXCEED A TOTAL OF 3 DOSES IN 15 MINUTES. 90 tablet 3   Omega-3 Fatty Acids (FISH OIL PO) Take 1 tablet by mouth daily.     oxyCODONE-acetaminophen (PERCOCET) 10-325 MG per tablet Take 1 tablet by mouth every 6 (six) hours as needed for pain.     predniSONE (DELTASONE) 10 MG tablet Take 10 mg by mouth daily.     sertraline (ZOLOFT) 100 MG tablet Take 100 mg by mouth daily.     TRELEGY ELLIPTA 200-62.5-25 MCG/ACT AEPB Inhale 1 puff into the lungs daily.     Vitamin D, Ergocalciferol, (DRISDOL) 1.25 MG  (50000 UNIT) CAPS capsule Take 50,000 Units by mouth 2 (two) times a week.     No current facility-administered medications for this visit.    Allergies:   Hydrocodone   Social History:  The patient  reports that he has been smoking cigarettes. He has a 15.00 pack-year smoking history. He has never used smokeless tobacco. He reports that he does not currently use alcohol. He reports that he does not use drugs.   Family History:  The patient's family history includes Heart attack in his paternal grandmother; Heart disease in his maternal grandmother; Kidney disease in his father; Stomach cancer in his maternal grandfather.    ROS:  Please see the history of present illness.  Otherwise, review of systems is positive for none.   All other systems are reviewed and negative.    PHYSICAL EXAM: VS:  BP 110/66   Pulse 60   Ht 5\' 9"  (1.753 m)   Wt 239 lb 12.8 oz (108.8 kg)   SpO2 94%   BMI 35.41 kg/m  , BMI Body mass index is 35.41 kg/m. GEN: Well nourished, well developed, in no acute distress  HEENT: normal  Neck: no JVD, carotid bruits, or masses Cardiac: RRR; no murmurs, rubs, or gallops,no edema  Respiratory:  clear to auscultation bilaterally, normal work of breathing GI: soft, nontender, nondistended, + BS MS: no deformity or atrophy  Skin: warm and dry, device pocket is well healed Neuro:  Strength and sensation are intact Psych: euthymic mood, full affect  EKG:  EKG is ordered today. Personal review of the ekg ordered shows sinus rhythm, ventricular paced, rate 60  Device interrogation is reviewed today in detail.  See PaceArt for details.   Recent Labs: 03/08/2022: ALT 31; B Natriuretic Peptide 789.1; Magnesium 2.0; TSH 1.130 03/12/2022: BUN 31; Creatinine, Ser 1.97; Hemoglobin 11.8; Platelets 136; Potassium 4.2; Sodium 140    Lipid Panel     Component Value Date/Time   CHOL 123 03/09/2022 0252   CHOL 141 05/30/2021 1050   TRIG 89 03/09/2022 0252   HDL 39 (L)  03/09/2022 0252   HDL 40 05/30/2021 1050   CHOLHDL 3.2 03/09/2022 0252   VLDL 18 03/09/2022 0252   LDLCALC 66 03/09/2022 0252   LDLCALC 77 05/30/2021 1050     Wt Readings from Last 3 Encounters:  06/13/22 239 lb 12.8 oz (108.8 kg)  04/06/22 242 lb 9.6 oz (110 kg)  03/11/22 246 lb 7.6 oz (111.8 kg)      Other studies Reviewed: Additional studies/ records that were reviewed today include: TTE 03/09/22  Review of the above records today demonstrates:   1. Left ventricular ejection fraction, by estimation, is 30 to 35%. The  left ventricle has moderately decreased function. The left ventricle  demonstrates regional wall motion abnormalities (see scoring  diagram/findings for description). Apical aneurysm.   The left ventricular internal cavity size was mildly dilated. There is  mild left ventricular hypertrophy. Left ventricular diastolic parameters  are indeterminate.   2. Right ventricular systolic function is normal. The right ventricular  size is normal. There is moderately elevated pulmonary artery systolic  pressure. The estimated right ventricular systolic pressure is 16.1 mmHg.   3. Right atrial size was mildly dilated.   4. The mitral valve is normal in structure. Mild mitral valve  regurgitation.   5. The aortic valve is tricuspid. Aortic valve regurgitation is trivial.  AV gradients were not measured   6. The inferior vena cava is dilated in size with <50% respiratory  variability, suggesting right atrial pressure of 15 mmHg.    ASSESSMENT AND PLAN:  1.  Chronic systolic heart failure: Due to ischemic cardiomyopathy.  Is status post Abbott CRT-D implanted 03/08/2022.  Device function appropriately.  No changes at this time.  2.  Coronary artery disease: No current chest pain.  Continue with plan per primary cardiology.  3.  Obstructive sleep apnea: Patient not on CPAP.  Plan per primary cardiology.  Current medicines are reviewed at length with the patient today.    The patient does not have concerns regarding his medicines.  The following changes were made today:  none  Labs/ tests ordered today include:  Orders Placed This Encounter  Procedures   EKG 12-Lead     Disposition:   FU with Ellayna Hilligoss 1 year  Signed, Terrie Haring Meredith Leeds, MD  06/13/2022 2:59 PM     [facility] [address] [place]  [place] [address] [phone] (office) [phone] (fax)

## 2022-06-13 NOTE — Patient Instructions (Signed)
Medication Instructions:  Your physician recommends that you continue on your current medications as directed. Please refer to the Current Medication list given to you today.  *If you need a refill on your cardiac medications before your next appointment, please call your pharmacy*   Lab Work: None ordered If you have labs (blood work) drawn today and your tests are completely normal, you will receive your results only by: [facility] (if you have MyChart) OR A paper copy in the mail If you have any lab test that is abnormal or we need to change your treatment, we will call you to review the results.   Testing/Procedures: None ordered   Follow-Up: At [facility], you and your health needs are our priority.  As part of our continuing mission to provide you with exceptional heart care, we have created designated Provider Care Teams.  These Care Teams include your primary Cardiologist (physician) and Advanced Practice Providers (APPs -  Physician Assistants and Nurse Practitioners) who all work together to provide you with the care you need, when you need it.  We recommend signing up for the patient portal called "MyChart".  Sign up information is provided on this After Visit Summary.  MyChart is used to connect with patients for Virtual Visits (Telemedicine).  Patients are able to view lab/test results, encounter notes, upcoming appointments, etc.  Non-urgent messages can be sent to your provider as well.   To learn more about what you can do with MyChart, go to [email or web address].    Remote monitoring is used to monitor your Pacemaker or ICD from home. This monitoring reduces the number of office visits required to check your device to one time per year. It allows [place] to keep an eye on the functioning of your device to ensure it is working properly. You are scheduled for a device check from home on 09/10/2022. You may send your transmission at any time that day. If you have a  wireless device, the transmission will be sent automatically. After your physician reviews your transmission, you will receive a postcard with your next transmission date.  Your next appointment:   1 year(s)  The format for your next appointment:   In Person  Provider:   Allegra Lai, MD    Thank you for choosing [facility]!!   Trinidad Curet, RN [phone]    Other Instructions  Important Information About Sugar

## 2022-06-22 NOTE — Progress Notes (Signed)
Remote ICD transmission.   

## 2022-07-11 ENCOUNTER — Other Ambulatory Visit: Payer: Self-pay | Admitting: Cardiovascular Disease

## 2022-07-18 ENCOUNTER — Ambulatory Visit: Payer: Medicare Other | Attending: Cardiovascular Disease | Admitting: Cardiovascular Disease

## 2022-07-18 ENCOUNTER — Encounter: Payer: Self-pay | Admitting: Cardiovascular Disease

## 2022-07-18 ENCOUNTER — Telehealth: Payer: Self-pay | Admitting: Cardiovascular Disease

## 2022-07-18 VITALS — BP 110/60 | HR 61 | Ht 69.0 in | Wt 249.0 lb

## 2022-07-18 DIAGNOSIS — I25118 Atherosclerotic heart disease of native coronary artery with other forms of angina pectoris: Secondary | ICD-10-CM

## 2022-07-18 DIAGNOSIS — I255 Ischemic cardiomyopathy: Secondary | ICD-10-CM | POA: Diagnosis not present

## 2022-07-18 DIAGNOSIS — I5022 Chronic systolic (congestive) heart failure: Secondary | ICD-10-CM | POA: Diagnosis not present

## 2022-07-18 DIAGNOSIS — J449 Chronic obstructive pulmonary disease, unspecified: Secondary | ICD-10-CM

## 2022-07-18 DIAGNOSIS — Z7901 Long term (current) use of anticoagulants: Secondary | ICD-10-CM

## 2022-07-18 DIAGNOSIS — Z95810 Presence of automatic (implantable) cardiac defibrillator: Secondary | ICD-10-CM

## 2022-07-18 DIAGNOSIS — E785 Hyperlipidemia, unspecified: Secondary | ICD-10-CM

## 2022-07-18 NOTE — Progress Notes (Signed)
I patient ID: Bobby [name], male   DOB: 1946/07/22, 76 y.o.   MRN: [id number]       HPI: Bobby [name] is a 76 y.o. male presents to the office today for a 6 month followup evaluation.   Bobby [name]  has a long-standing tobacco history having started smoking at age 64, a history of hypertension, remote small TIA/CVA who presented to the hospital on 08/04/2012 in the setting of inferior ST segment elevation myocardial infarction. Catheterization by me revealed a 99% stenosis of the RCA and concomitant CAD involving his LAD and circumflex vessels. He underwent acute percutaneous coronary intervention with an excellent door to balloon time of only 20 minutes and ultimately insertion of a 3.25x32 mm DES stent post dilated 3.3. An echo done in the hospital showed an EF of 50-60% with mild basal inferior probable scar and moderate pulmonary hypertension with PA pressure of 45 mm.  When I saw Bobby [name] in May 2014, he did note shortness of breath with activity. He denied definitive chest pain. At that time, he realized that he had inadvertentlystopped taking the atorvastatin 40 mg dose and this was resumed. In addition, his Toprol dose was increased from 50 to 75 mg daily. A nuclear study showed an ejection fraction of 66%. Perfusion was essentially normal with exception of a small region of fixed inferoseptal bowel artifact and possible small area of inferobasilar scar.   Bobby [name] unfortunately continues to smoke at least a pack of cigarettes per day.   He does experience shortness of breath with activity.  He denies recent chest pain.  He has chronic right bundle branch block with repolarization changes.  He is diabetic and has pulmonary hypertension.  A follow-up nuclear perfusion study on 03/26/2014 remianed low risk and showed normal LV function and normal wall motion.  There was a fixed inferior defect that was felt most consistent with diaphragmatic attenuation.  There was no evidence for ischemia.  He  underwent prostate surgery by Dr. [name] without cardiovascular compromise.    A screening abdominal aortic ultrasound revealed a normal abdominal aorta.  Since I saw him in November 2017, he had an extensive history and has had recurrent hospitalizations he was hospitalized in October 2018 for left arm pain.  Troponin was negative.  In December 2018 he suffered an anterior ST segment elevation MI due to acute occlusion of his mid LAD and underwent successful PCI of his LAD with insertion of a 2.5 x 22 mm Resolute Onyx DES stent.  He was also found to have 99% dominant mid left circumflex lesion with 80% stenosis in the OM1 vessel and underwent staged intervention to the mid circumflex and marginal vessels.  Troponin was greater than 65.  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  There was mention of sludge is a sign of pre-thrombotic state but no definite thrombus was seen at the apex.  He returned to the hospital August 20, 2017 with malaise and dyspnea.  He was bradycardic.  He was diuresed.  He has renal insufficiency with creatinines increasing up to 1.9.  He has been followed by [name] on numerous occasions in the office.  Unfortunately continues to smoke cigarettes.  He admits to 1/2 pack/day.  He does note occasional dizziness as well as some shortness of breath.  He denies recurrent anginal type symptoms.   Suggestive of low flow state.  When I  saw him on Jan 07, 2018  I recommended that he undergo a follow-up echo Doppler study to reassess systolic and diastolic function as well as potential for apical thrombus formation.  He had continued to be on aspirin and Brilinta following his STEMI.  He was on atorvastatin 80 mg for hyperlipidemia with target less than 70.  I had a long discussion regarding smoking cessation.  He underwent a follow-up echo Doppler study on Jan 20, 2018.  This showed reduced LV function with an EF  of 35 to 40% with akinesis of the mid apical anterolateral and septal as well as apical walls.  There was grade 1 diastolic dysfunction.  There was now a possible small layered thrombus at the apex noted using Definity and also evidence of swirling at the LV apex suggestive of low flow state.   As result of the echo findings, I saw him in follow-up on Jan 24, 2018 at which time I had a very long discussion with him guarding potential thromboembolic stroke risk.  He was started on warfarin anticoagulation.  Apparently, the patient's medications have been in a  pack and had been followed by his girlfriend.  He was taking his medications correctly resulting in significant over anticoagulation with INR reached greater than 10.  He has been followed very closely by our pharmacy department and he also was given vitamin K.  Due to his poor medication compliance after much discussion ultimately the thought is for him to transition to Eliquis once his INR gets below 2.  He also has been taking his blood pressure medicines incorrectly.  Patient has been sleeping most of the day.  In the past I had recommended he undergo a sleep evaluation for sleep apnea which he never followed up with it was a no-show in the sleep lab.  He was worked into my schedule on February 21, 2018 and was seen as an add-on due to his medication issues.  He was here now with his sister who in the past had done an excellent job in caring for his medications and she is committed now to resume doing this for him.  During his evaluation, he was exceptionally somnolent, and a significant time was spent with medication adjustment.  Due to his inability to take effect of warfarin the decision was made to use Eliquis for anticoagulation with more consistent dosing.  His recent laboratory had shown stage IV chronic kidney disease and it was recommended that he hold his lisinopril for several days and then reduce the dose to 5 mg as well as holding his  furosemide with subsequent reduction of dose with reinstitution at 20 mg.  I scheduled him for an expeditious sleep study due to concerns for significant sleep apnea and he was seen5 days later by Racquel with medication adjustment and anticoagulation issues.  He underwent a sleep study on February 25, 2018 and he was found to have moderate overall sleep apnea with an AHI of 22.2 and RDI of 25.4.  He could not achieve any rem sleep and the overall severity may very well be underestimated.  He was titrated up to 10 cm water pressure.  An initial trial of CPAP auto with an EPR range of 8-15 was recommended.  When I last saw him on March 19, 2018 he had he had just initiated CPAP the evening before noted significant improvement in his sleep. His renal function had significantly improved from a creatinine of 2.7 down to 1.362 weeks ago.  He  is unaware of any recurrent episodes of arrhythmia.  Apparently his blood pressure had become elevated and Dr. Darron Doom added losartan to his medical regimen.  Of note, he also already was on lisinopril 2.5 mg, furosemide 20 mg, carvedilol 6.25 mg twice a day.  He has been only intermittently using CPAP due to complaints of the mask on his nose bridge.  As result, he was recently changed to a different mask.  A download was obtained in the office today in which he does not meet compliance.  [employer] is his DME company.  His set up date was March 17, 2018 and he has until June 17, 2018 to demonstrate compliance.    When I saw him in September 2019 his blood pressure was elevated.  At that time apparently he was on both the previous lisinopril and the recent losartan that was started by Dr. Darron Doom.  I suggested he discontinue lisinopril and further titrated losartan to 25 mg twice a day both for improved blood pressure and for his reduced LV function.  He was not compliant with reference to his CPAP therapy and I had a long discussion with him regarding the importance that he meet  compliance by October 15.  I saw him in October 2019.  At that time he was not using his CPAP therapy.  I reviewed his blood pressure recordings and they seem to be consistently elevated.  He continues to be on Eliquis and was taking Lasix 20 mg.  That evaluation I had a long discussion with him guarding his noncompliance with CPAP.  I discussed alternatives such as customized oral appliance.  We discussed with his LV dysfunction he may be a candidate for Jardiance if his renal function is stable.  Was maintaining sinus rhythm.  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  His ECG was unchanged.  Troponins were negative.  He was discharged from the ER and seen by [name], [facility] on October 01, 2018.  He had not had any further chest pain since he left the hospital and no ischemic work-up was planned.  His renal function had worsened and his furosemide was changed to every other day.  Visibly he feels well and has not had recurrent chest pain.  He uses oxygen at 2 L nasal cannula at nighttime instead of his CPAP therapy.  Laboratory 2 days prior to his office visit has shown a creatinine of 2.07 with a BUN of 37.  Potassium was 5.4.   I saw him in February 2020.  At that time his blood pressure was stable and he was taken furosemide 20 mg every other day, carvedilol 18.75 mg twice a day, and valsartan 40 mg.  Renal function improved with discontinuance of furosemide.  I saw him in November 2020 when he came to the office with his sister. He was on supplemental oxygen at nighttime but has not been using CPAP.  Upon further questioning he had difficulty with the mask on the bridge of his nose.  Recently, his blood pressure has been elevated and he has been of seen by Dr. Darron Doom.  He apparently was now on losartan 50 mg in place of valsartan, carvedilol 18.75 mg twice a day,  and he is on Plavix in addition to apixaban 5 mg twice a day.  He continued to be on atorvastatin 80  mg daily for hyperlipidemia.  He is diabetic on metformin.  He is on bupropion 300 mg for depression.  During that evaluation, his blood pressure was elevated at 162/90 I recommended further titration of carvedilol to 25 mg twice a day and added amlodipine 5 mg.  I again discussed the importance of using CPAP therapy.  He had stage III chronic kidney disease with creatinine improving from 2.07 to 1.77.  I saw him in May 2021 at which time any chest pain.He was treated with an infection by Dr. Darron Doom and at times has had some recurrent congestion.  He is breathing better.  He uses supplemental oxygen but admits that at times he does not use it every night.  Unfortunately he still smoking a pack of cigarettes lasting 2 to 2-1/2 days.  Laboratory on November 05, 2019 showed a creatinine at 2.25.  He has recently been on [employer] in addition to albuterol for wheezing.  However he had was not consistently using his combination drug treatment.    I saw him in May 02, 2020.  Over the prior months he continued to be stable from a cardiac standpoint.   He underwent a follow-up echo Doppler study on February 15, 2020 which showed an EF of 35 to 40%.  There was mild LVH of the basal septal segment.  There was grade 1 diastolic dysfunction.  He had previously noted akinesis of the apex and basal inferior wall and mid apical anteroseptal wall.  There was no evidence for residual thrombus.  Last week he under went right neck melanoma resection by Dr. Martin Majestic.  He has further decreased his tobacco use.    He was evaluated by Kerin Ransom in March 2022.  I last saw him on May 11, 2021 at which time he remained relatively stable but admitted to rare episodes of chest discomfort occurring approximately every 1 to 1-1/2 months.  Unfortunately he continues to smoke cigarettes.  He believes he is sleeping adequately.  His  primary physician, Dr. Darron Doom had checked laboratory July 08, 2020.  Creatinine was increased at 1.94.  He is unaware of palpitations, presyncope or syncope.  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  He had anteroseptal Q waves consistent with his prior MI.  I recommended follow-up laboratory and a follow-up echo Doppler assessment.  He was evaluated by pulmonary on June 15, 2019.[name]  He has COPD and dyspnea which is multifactorial as result of his combined systolic and diastolic heart failure, COPD, CAD, and cardiac deconditioning.  I last saw him on July 17, 2021.  His echo Doppler from June 05, 2021 showed an EF of 30 to 35% with previously noted septal apical akinesis and inferior wall hypokinesis, mild mitral regurgitation, and mild to moderate aortic valve sclerosis without stenosis.  He has continued to be on Eliquis for anticoagulation.  Laboratory had shown elevation of potassium  potassium at 5.5 and creatinine increased to 2.36.  He was advised to hold losartan as well as discontinue spironolactone.    I saw him on September 05, 2021.  At that time he was experiencing occasional shortness of breath.  He was evaluated by Gerald Leitz, NP pulmonary on August 22, 2021 as part of the lung cancer screening.  Chest CT on December 21 showed tiny pulmonary nodules felt to be long RADS 2 benign appearance.  He also was noted to have coronary atherosclerosis.  He was  recently evaluated at [facility] ER on January 1, diagnosed with pneumonia and discharged with doxycycline.  He denies any chest tightness or pressure.  He has not been using CPAP therapy.  Follow-up laboratory was recommended and a more recent creatinine had improved to 1.64  He was hospitalized from January 5 through September 11, 2021 with hypoxemia, initially required BiPAP but he became unresponsive and was ultimately intubated.  He was  felt to have septic shock and required pressor therapy.  He ultimately stabilized and remained on antibiotic therapy with ceftriaxone and azithromycin in addition to prednisone.  I last saw him on Jan 09, 2022.  Since his hospitalization, he has felt well.  He sees his primary physician Dr. Horald Pollen regularly.  He recently was started on furosemide 40 mg by Dr. Wynetta Emery.  He continues to smoke.  He denies chest pain.  He does not use CPAP.  He continued to be on low-dose amlodipine 5 mg, carvedilol 25 mg twice a day and low-dose hydralazine 10 mg twice a day.  He was on low-dose prednisone at 10 mg and on atorvastatin 80 mg.  Since I saw him, he presented to the hospital on March 09, 2022 after an episode of syncope.  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  Due to his history of heart failure he underwent insertion of an Abbott CRT-D device on March 11, 2022 by Dr. Curt Bears.  He has subsequently seen Dr. Curt Bears on June 13, 2022 in the office and was remaining stable.  ECG showed sinus rhythm V paced at a rate of 60.  His device was functioning appropriately.  Presently, Mr. Monnier denies any chest pain or significant shortness of breath.  He no longer uses CPAP therapy for his obstructive sleep apnea.  He is breathing much better.  Unfortunately he is still smoking with a pack lasting 2 to 3 days.  He is now on a regimen of zotepine 5 mg, carvedilol 25 mg twice a day, hydralazine 50 mg every 8 hours, furosemide 40 mg daily isosorbide 30 mg daily and is on atorvastatin 80 mg for hyperlipidemia.  He is anticoagulated on Eliquis 5 mg twice a day.  He is on Trelegy Ellipta and albuterol for COPD and continues to be on low-dose prednisone 10 mg daily.  He presents for evaluation.  Past Medical History:  Diagnosis Date   DM (diabetes mellitus), type 2 new diagnosis 08/05/2012   diet control   ED (erectile dysfunction)    surgery planned   Hyperlipidemia 08/05/2012   Hypertensive heart disease    Panic  attack    Pulmonary HTN ([place])    echo 08/05/12, EF 55-60%, PA pressure 3m   S/P coronary artery stent placement, to RCA Promus DES 08/05/2012   Stroke ([place]    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   Tobacco abuse     Past Surgical History:  Procedure Laterality Date   BIV ICD INSERTION CRT-D N/A 03/11/2022   Procedure: BIV ICD INSERTION CRT-D;  Surgeon: CConstance Haw MD;  [facility];  Service: Cardiovascular;  Laterality: N/A;   CARDIAC CATHETERIZATION  08/04/12   PCI to RCA with DES   CORONARY STENT INTERVENTION N/A 08/12/2017   Procedure: CORONARY STENT INTERVENTION;  Surgeon: JMartinique Peter M, MD;  [facility];  Service: Cardiovascular;  Laterality: N/A;   CORONARY/GRAFT ACUTE MI REVASCULARIZATION N/A 08/09/2017   Procedure: Coronary/Graft Acute MI Revascularization;  Surgeon: SBelva Crome MD;  [facility];  Service: Cardiovascular;  Laterality: N/A;   LEFT HEART CATH AND CORONARY ANGIOGRAPHY N/A 08/09/2017   Procedure: LEFT HEART CATH AND CORONARY ANGIOGRAPHY;  Surgeon: Belva Crome, MD;  Location: [place] [facility] LAB;  Service: Cardiovascular;  Laterality: N/A;   LEFT HEART CATHETERIZATION WITH CORONARY ANGIOGRAM N/A 08/04/2012   Procedure: LEFT HEART CATHETERIZATION WITH CORONARY ANGIOGRAM;  Surgeon: Troy Sine, MD;  [facility] LAB;  Service: Cardiovascular;  Laterality: N/A;   LEG SURGERY Left    rod placed for fracture repair   PENILE PROSTHESIS IMPLANT N/A 06/03/2014   Procedure: IMPLANT PENILE PROTHESIS INFLATABLE;  Surgeon: Ailene Rud, MD;  Location: [facility];  Service: Urology;  Laterality: N/A;  with penile block--0.5% marcaine plain   PERCUTANEOUS CORONARY STENT INTERVENTION (PCI-S) Right 08/04/2012   Procedure: PERCUTANEOUS CORONARY STENT INTERVENTION (PCI-S);  Surgeon: Troy Sine, MD;  Location: [facility] CATH LAB;  Service: Cardiovascular;  Laterality: Right;   TEMPORARY PACEMAKER N/A 03/08/2022   Procedure:  TEMPORARY PACEMAKER;  Surgeon: Belva Crome, MD;  Location: [place] CV LAB;  Service: Cardiovascular;  Laterality: N/A;   THUMB ARTHROSCOPY Left    thumb joint replaced    Allergies  Allergen Reactions   Hydrocodone Itching    Current Outpatient Medications  Medication Sig Dispense Refill   acetaminophen (TYLENOL) 325 MG tablet Take 2 tablets (650 mg total) by mouth every 4 (four) hours as needed for headache or mild pain.     albuterol (PROVENTIL HFA;VENTOLIN HFA) 108 (90 Base) MCG/ACT inhaler Inhale 2 puffs into the lungs every 6 (six) hours as needed for wheezing or shortness of breath. 1 Inhaler 2   apixaban (ELIQUIS) 5 MG TABS tablet Take 1 tablet (5 mg total) by mouth 2 (two) times daily. 28 tablet 1   aspirin EC 81 MG tablet Take 1 tablet (81 mg total) by mouth daily. Swallow whole. 90 tablet 3   atorvastatin (LIPITOR) 80 MG tablet Take 1 tablet (80 mg total) by mouth daily at 6 PM. 90 tablet 3   BREZTRI AEROSPHERE 160-9-4.8 MCG/ACT AERO Inhale 2 puffs into the lungs 2 (two) times daily.     buPROPion (WELLBUTRIN XL) 300 MG 24 hr tablet Take 300 mg by mouth daily.     carvedilol (COREG) 25 MG tablet TAKE ONE TABLET BY MOUTH TWICE DAILY 180 tablet 3   clonazePAM (KLONOPIN) 1 MG tablet Take 1 mg by mouth 2 (two) times daily as needed for anxiety.     Coenzyme Q10 (COQ10 PO) Take 1 capsule by mouth daily.     COLCRYS 0.6 MG tablet Take 0.6 mg by mouth daily.  3   ferrous sulfate 325 (65 FE) MG EC tablet Take 325 mg by mouth 2 (two) times a week.     furosemide (LASIX) 40 MG tablet Take 40 mg by mouth daily.     hydrALAZINE (APRESOLINE) 50 MG tablet Take 1 tablet (50 mg total) by mouth every 8 (eight) hours. 90 tablet 6   isosorbide mononitrate (IMDUR) 30 MG 24 hr tablet Take 1 tablet (30 mg total) by mouth daily. 30 tablet 6   Omega-3 Fatty Acids (FISH OIL PO) Take 1 tablet by mouth daily.     oxyCODONE-acetaminophen (PERCOCET) 10-325 MG per tablet Take 1 tablet by mouth every 6  (six) hours as needed for pain.     sertraline (ZOLOFT) 100 MG tablet Take 100 mg by mouth daily.     TRELEGY ELLIPTA 200-62.5-25 MCG/ACT  AEPB Inhale 1 puff into the lungs daily.     Vitamin D, Ergocalciferol, (DRISDOL) 1.25 MG (50000 UNIT) CAPS capsule Take 50,000 Units by mouth 2 (two) times a week.     amLODipine (NORVASC) 5 MG tablet TAKE ONE TABLET BY MOUTH DAILY 180 tablet 3   nitroGLYCERIN (NITROSTAT) 0.4 MG SL tablet DISSOLVE 1 TABLET UNDER THE TONGUE EVERY 5 MINUTES AS NEEDED FOR CHEST PAIN. DO NOT EXCEED A TOTAL OF 3 DOSES IN 15 MINUTES. 90 tablet 3   predniSONE (DELTASONE) 10 MG tablet Take 10 mg by mouth daily. (Patient not taking: Reported on 07/18/2022)     No current facility-administered medications for this visit.    Socially,  he is divorced. He doesn't walk but not routinely exercise.  He continues to smoke one pack of cigarettes per day.  ROS General: Negative; No fevers, chills, or night sweats;  HEENT: Negative; No changes in vision or hearing, sinus congestion, difficulty swallowing Pulmonary:  Positive for shortness of breath; COPD Cardiovascular: See history of present illness GI: Negative; No nausea, vomiting, diarrhea, or abdominal pain GU: Negative; No dysuria, hematuria, or difficulty voiding Musculoskeletal: Negative; no myalgias, joint pain, or weakness Hematologic/Oncology: Negative; no easy bruising, bleeding Endocrine: Positive for diabetes no heat/cold intolerance;  Neuro: Negative; no changes in balance, headaches Skin: Negative; No rashes or skin lesions Psychiatric: Negative; No behavioral problems, depression Sleep: obstructive sleep apnea, CPAP initiated March 17, 2018; currently not on therapy. no bruxism, restless legs, hypnogognic hallucinations, no cataplexy Other comprehensive 14 point system review is negative.   PE BP 110/60   Pulse 61   Ht _0  (1.753 m)   Wt 249 lb (112.9 kg)   SpO2 93%   BMI 36.77 kg/m    Repeat blood pressure  by me was 112/64  Wt Readings from Last 3 Encounters:  07/18/22 249 lb (112.9 kg)  06/13/22 239 lb 12.8 oz (108.8 kg)  04/06/22 242 lb 9.6 oz (110 kg)   General: Alert, oriented, no distress.  Skin: normal turgor, no rashes, warm and dry HEENT: Normocephalic, atraumatic. Pupils equal round and reactive to light; sclera anicteric; extraocular muscles intact;  Nose without nasal septal hypertrophy Mouth/Parynx benign; Mallinpatti scale 3 Neck: No JVD, no carotid bruits; normal carotid upstroke Lungs: clear to ausculatation and percussion; no wheezing or rales Chest wall: without tenderness to palpitation Heart: PMI not displaced, RRR, s1 s2 normal, 1/6 systolic murmur, no diastolic murmur, no rubs, gallops, thrills, or heaves Abdomen: soft, nontender; no hepatosplenomehaly, BS+; abdominal aorta nontender and not dilated by palpation. Back: no CVA tenderness Pulses 2+ Musculoskeletal: full range of motion, normal strength, no joint deformities Extremities: no clubbing cyanosis or edema, Homan's sign negative  Neurologic: grossly nonfocal; Cranial nerves grossly wnl Psychologic: Normal mood and affect    July 18, 2022 ECG (independently read by me): A paced and sensed, V paced at 61, Anterior Q waves  May 9, 2023ECG (independently read by me):  Sinus bradycardia at 53, LAD, RBBB, Q waves inferiorly and anteriorly    September 05, 2021 ECG (independently read by me):  NSR at 84, LAD, RBBB, LVH Q waves V3-6, II, aVF  July 17, 2021 ECG (independently read by me):  NSR at 62, LAD, RBBB, old inferior and anterior infarct  May 11, 2021 ECG (independently read by me): Normal sinus rhythm at 76 bpm, right bundle branch block, left anterior hemiblock.  No ectopy.  Anteroseptal Q waves consistent with prior anterior MI  May 02, 2020 ECG (independently read by me): Normal sinus rhythm at 69 bpm, bifascicular block with right bundle branch block and left anterior hemiblock.  On  anteroseptal Q waves and inferior Q waves consistent with prior MIs.  Mild T wave abnormality laterally.  QTc interval 432 ms.     May 21, 2021ECG (independently read by me): Sinus bradycarrdia at 57; RBBB, Q V1-3 ands avF  July 24, 2019 ECG (independently read by me): Normal sinus rhythm at 80 bpm.  Right bundle branch block; previously noted Q waves V1 through V4 and 2 3 and F consistent with prior infarct  October 15, 2018 ECG (independently read by me): Normal sinus rhythm at 93 bpm with baseline artifact.  Right bundle branch block.  Anterior Q waves consistent with prior infarct.  Inferior Q waves.  ECG (independently read by me): Sinus rhythm at 65 bpm.  PAC.  Right bundle branch block, inferior Q waves in 3 and aVF, old anterior myocardial infarction.  Lateral T wave abnormality.  Normal intervals.  June 16, 2018 ECG (independently read by me): Normal sinus rhythm at 84 bpm.  Right bundle branch block with repolarization changes.  Inferior Q waves consistent with prior infarct.  Anteroseptal Q waves consistent with prior anterior MI.  May 28, 2018 ECG (independently read by me): Normal sinus rhythm with sinus arrhythmia.  Right bundle branch block with repolarization changes.  Q waves consistent with anteroseptal MI and possible inferior MI no indication to adjust the pain upon his needs  July 17,2019 ECG (independently read by me): Normal sinus rhythm 89 bpm.  Right bundle branch block with repolarization changes.  Inferior Q waves and anterior  Q waves consistent with anterolateral infarct with possible inferior infarct  February 21, 2018 ECG (independently read by me): Normal sinus rhythm at 95 bpm, right bundle branch block with repolarization changes.  Anterolateral MI.  QTc interval 477 ms  Jan 24, 2018 ECG (independently read by me): Sinus rhythm at 97 bpm.  Right bundle branch block with repolarization changes.  QTc interval 472 ms.  Anterolateral Q waves  Jan 07, 2018  ECG (independently read by me): Normal sinus rhythm at 88 bpm.  Right bundle branch block with repolarization changes.  Inferior Q waves.  T wave anteroseptally, possible anteroseptal MI undetermined.  November 2017 ECG (independently read by me): Normal sinus rhythm at 64 bpm.  Branch block with repolarization changes.  QTc interval 460 ms.  June 2016 ECG (independently read by me): Sinus bradycardia at 57 bpm, right bundle branch block with repositioning changes.  QTc interval 459 ms.  September 2015 ECG (independently read by me and (: Normal sinus rhythm.  Right bundle branch block with repolarization changes.  Ventricular rate 81.  02/25/2014 ECG Normal sinus rhythm.  Right bundle branch block with repolarization changes.  PR interval 148 ms; QTc interval 463 ms  Prior ECG: sinus rhythm at 76 beats per minute; right bundle-branch block with repolarization changes. PR interval 142 ms, QTC 481 ms.   LABS:     Latest Ref Rng & Units 07/18/2022   12:36 PM 03/12/2022    1:24 AM 03/11/2022   12:30 AM  BMP  Glucose 70 - 99 mg/dL 109  126  162   BUN 8 - 27 mg/dL 66  31  37   Creatinine 0.76 - 1.27 mg/dL 2.43  1.97  2.26   BUN/Creat Ratio 10 - 24 27     Sodium 134 - 144 mmol/L 141  140  140   Potassium 3.5 - 5.2 mmol/L 4.5  4.2  4.1   Chloride 96 - 106 mmol/L 104  108  110   CO2 20 - 29 mmol/L _0 Calcium 8.6 - 10.2 mg/dL 10.0  9.5  9.5       Latest Ref Rng & Units 07/18/2022   12:36 PM 03/08/2022    1:05 PM 09/07/2021    6:00 AM  Hepatic Function  Total Protein 6.0 - 8.5 g/dL 6.7  6.2  7.0   Albumin 3.8 - 4.8 g/dL 4.5  3.6  4.0   AST 0 - 40 IU/L _1 ALT 0 - 44 IU/L _2 Alk Phosphatase 44 - 121 IU/L 113  89  130   Total Bilirubin 0.0 - 1.2 mg/dL 0.2  0.7  0.5       Latest Ref Rng & Units 03/12/2022    1:24 AM 03/11/2022   12:30 AM 03/09/2022    2:52 AM  CBC  WBC 4.0 - 10.5 K/uL 10.8  9.9  10.1   Hemoglobin 13.0 - 17.0 g/dL 11.8  12.1  12.5   Hematocrit  39.0 - 52.0 % 36.7  37.7  38.8   Platelets 150 - 400 K/uL 136  140  152    Lab Results  Component Value Date   MCV 87.8 03/12/2022   MCV 90.2 03/11/2022   MCV 89.0 03/09/2022   Lab Results  Component Value Date   TSH 1.130 03/08/2022   Lipid Panel     Component Value Date/Time   CHOL 123 03/09/2022 0252   CHOL 141 05/30/2021 1050   TRIG 89 03/09/2022 0252   HDL 39 (L) 03/09/2022 0252   HDL 40 05/30/2021 1050   CHOLHDL 3.2 03/09/2022 0252   VLDL 18 03/09/2022 0252   LDLCALC 66 03/09/2022 0252   LDLCALC 77 05/30/2021 1050   INR result since initiation of Coumadin: 1:> 7.6;> >10;> 8.6 (Vit K administered) >4.3; no longer on Coumadin, now on Eliquis  RADIOLOGY: No results found.  IMPRESSION: 1. Chronic systolic (congestive) heart failure ([place])   2. Ischemic cardiomyopathy   3. Long term (current) use of anticoagulants   4. Coronary artery disease involving native coronary artery of native heart with other form of angina pectoris ([place])   5. Chronic obstructive pulmonary disease, unspecified COPD type ([place])   6. Hyperlipidemia LDL goal <70   7. Cardiac resynchronization therapy defibrillator (CRT-D) in place     ASSESSMENT AND PLAN:  Mr. Garcon is a 76 year old Caucasian male who suffered an acute coronary syndrome on 08/04/2012 when he presented with subtotal occlusion of his RCA. He had diffuse disease beyond the subtotal occlusion and ultimately had successful insertion of a 3.25x32 mm Promus DES stent. He had concomitant CAD with 40-50% proximal LAD narrowing, 30% circumflex marginal stenoses. NMR lipoprofile after initiation of atorvastatin in January 2014 showed marked improvement in total cholesterol of 119 LDL 48 LDL particle #992. HDL was very low at 30 with an increased triglyceride at 203 and significantly reduced HDL particle #24.2. His insulin resistance score was elevated at 65.  He suffered an anterior STEMI in December 2018 and underwent successful stenting and  several days later he required staged intervention to circumflex and marginal vessel. 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  Warfarin was initially prescribed but due to significant issues with  compliance and overmedication the decision was made that he would be safer using Eliquis even though he is on this for apical thrombus rather than atrial fibrillation.   He was subsequently found to have significant obstructive sleep apnea but stopped therapy and uses nocturnal oxygen.  An echo Doppler study in June 2021 continue to show an EF at 35 to 40% with moderate dilation of LV internal dimensions.  There was mild LVH and grade 1 diastolic dysfunction.  There was evidence for moderate aortic sclerosis with mild mitral annular calcification.  There was no evidence for his previous apical thrombus.  His more recent echo study from June 05, 2021 continued to show reduced EF at 30 to 35%, mild mitral regurgitation, and mild to moderate aortic sclerosis without stenosis.  Previously, he had developed worsening renal function and hyperkalemia necessitating discontinuance of losartan and spironolactone.   He was hospitalized in January 2023 with significant hypoxia, required intubation, but was felt to have septic shock for which she was treated with a brief course of pressor therapy.  He was treated with ceftriaxone and azithromycin in addition to prednisone.  He was subsequently seen by his primary physician Dr. Darron Doom on several occasions.  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  With his history of heart failure he underwent successful BiV ICD insertion CRT-D by Dr. Curt Bears.  Presently, he is feeling well without chest pain or shortness of breath and is breathing better.  Unfortunately he still smokes and a pack of cigarettes last 2 to 3 days.  He continues to be on Eliquis for anticoagulation.  His blood pressure today is stable on  amlodipine 5 mg, carvedilol 25 mg twice a day, hydralazine 50 mg every 8 hours, furosemide 40 mg, and isosorbide 30 mg.  He is on atorvastatin 80 mg for hyperlipidemia.  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  I have recommended follow-up laboratory with a comprehensive metabolic panel and BNP.  Prior to his next office visit in February 2024 I am scheduling him for follow-up echo Doppler study for reassessment of LV systolic and diastolic function following his CRT-D.   Troy Sine, MD, [facility]  07/23/2022 12:28 PM

## 2022-07-18 NOTE — Telephone Encounter (Signed)
Pt's sister returning call. Please advise

## 2022-07-18 NOTE — Telephone Encounter (Signed)
Spoke with Bobby [name] notified that no note was placed at this time. Bobby [name] thankful for the call back.

## 2022-07-18 NOTE — Patient Instructions (Signed)
Medication Instructions:  The current medical regimen is effective;  continue present plan and medications.  *If you need a refill on your cardiac medications before your next appointment, please call your pharmacy*   Lab Work: CMET, BNP today   If you have labs (blood work) drawn today and your tests are completely normal, you will receive your results only by: [place] (if you have MyChart) OR A paper copy in the mail If you have any lab test that is abnormal or we need to change your treatment, we will call you to review the results.   Testing/Procedures: Echocardiogram (February) - Your physician has requested that you have an echocardiogram. Echocardiography is a painless test that uses sound waves to create images of your heart. It provides your doctor with information about the size and shape of your heart and how well your heart's chambers and valves are working. This procedure takes approximately one hour. There are no restrictions for this procedure.     Follow-Up: At [facility], you and your health needs are our priority.  As part of our continuing mission to provide you with exceptional heart care, we have created designated Provider Care Teams.  These Care Teams include your primary Cardiologist (physician) and Advanced Practice Providers (APPs -  Physician Assistants and Nurse Practitioners) who all work together to provide you with the care you need, when you need it.  We recommend signing up for the patient portal called "MyChart".  Sign up information is provided on this After Visit Summary.  MyChart is used to connect with patients for Virtual Visits (Telemedicine).  Patients are able to view lab/test results, encounter notes, upcoming appointments, etc.  Non-urgent messages can be sent to your provider as well.   To learn more about what you can do with MyChart, go to [email or web address].    Your next appointment:   3 month(s)  The format for your  next appointment:   In Person  Provider:   Shelva Majestic, MD

## 2022-07-19 ENCOUNTER — Other Ambulatory Visit: Payer: Self-pay | Admitting: Cardiovascular Disease

## 2022-07-20 LAB — COMPREHENSIVE METABOLIC PANEL
ALT: 28 IU/L (ref 0–44)
AST: 22 IU/L (ref 0–40)
Albumin/Globulin Ratio: 2 (ref 1.2–2.2)
Albumin: 4.5 g/dL (ref 3.8–4.8)
Alkaline Phosphatase: 113 IU/L (ref 44–121)
BUN/Creatinine Ratio: 27 — ABNORMAL HIGH (ref 10–24)
BUN: 66 mg/dL — ABNORMAL HIGH (ref 8–27)
Bilirubin Total: 0.2 mg/dL (ref 0.0–1.2)
CO2: 22 mmol/L (ref 20–29)
Calcium: 10 mg/dL (ref 8.6–10.2)
Chloride: 104 mmol/L (ref 96–106)
Creatinine, Ser: 2.43 mg/dL — ABNORMAL HIGH (ref 0.76–1.27)
Globulin, Total: 2.2 g/dL (ref 1.5–4.5)
Glucose: 109 mg/dL — ABNORMAL HIGH (ref 70–99)
Potassium: 4.5 mmol/L (ref 3.5–5.2)
Sodium: 141 mmol/L (ref 134–144)
Total Protein: 6.7 g/dL (ref 6.0–8.5)
eGFR: 27 mL/min/{1.73_m2} — ABNORMAL LOW (ref >59)

## 2022-07-20 LAB — BRAIN NATRIURETIC PEPTIDE: BNP: 139.8 pg/mL — ABNORMAL HIGH (ref 0.0–100.0)

## 2022-07-23 ENCOUNTER — Encounter: Payer: Self-pay | Admitting: Cardiovascular Disease

## 2022-08-23 ENCOUNTER — Ambulatory Visit
Admission: RE | Admit: 2022-08-23 | Discharge: 2022-08-23 | Disposition: A | Discharge status: Home / Self Care | Payer: Medicare Other | Source: Ambulatory Visit | Attending: Family Medicine | Admitting: Family Medicine

## 2022-08-23 DIAGNOSIS — F17210 Nicotine dependence, cigarettes, uncomplicated: Secondary | ICD-10-CM

## 2022-08-23 DIAGNOSIS — Z87891 Personal history of nicotine dependence: Secondary | ICD-10-CM

## 2022-08-28 ENCOUNTER — Other Ambulatory Visit: Payer: Self-pay | Admitting: Acute Care

## 2022-08-28 DIAGNOSIS — F17210 Nicotine dependence, cigarettes, uncomplicated: Secondary | ICD-10-CM

## 2022-08-28 DIAGNOSIS — Z122 Encounter for screening for malignant neoplasm of respiratory organs: Secondary | ICD-10-CM

## 2022-08-28 DIAGNOSIS — Z87891 Personal history of nicotine dependence: Secondary | ICD-10-CM

## 2022-09-01 IMAGING — DX DG CHEST 1V PORT
1 series · 1 of 1 positions shown · non-contrast
Comparison: 09/08/2021

CLINICAL DATA: Pneumonia respiratory distress.

EXAM:
PORTABLE CHEST 1 VIEW

[chest]
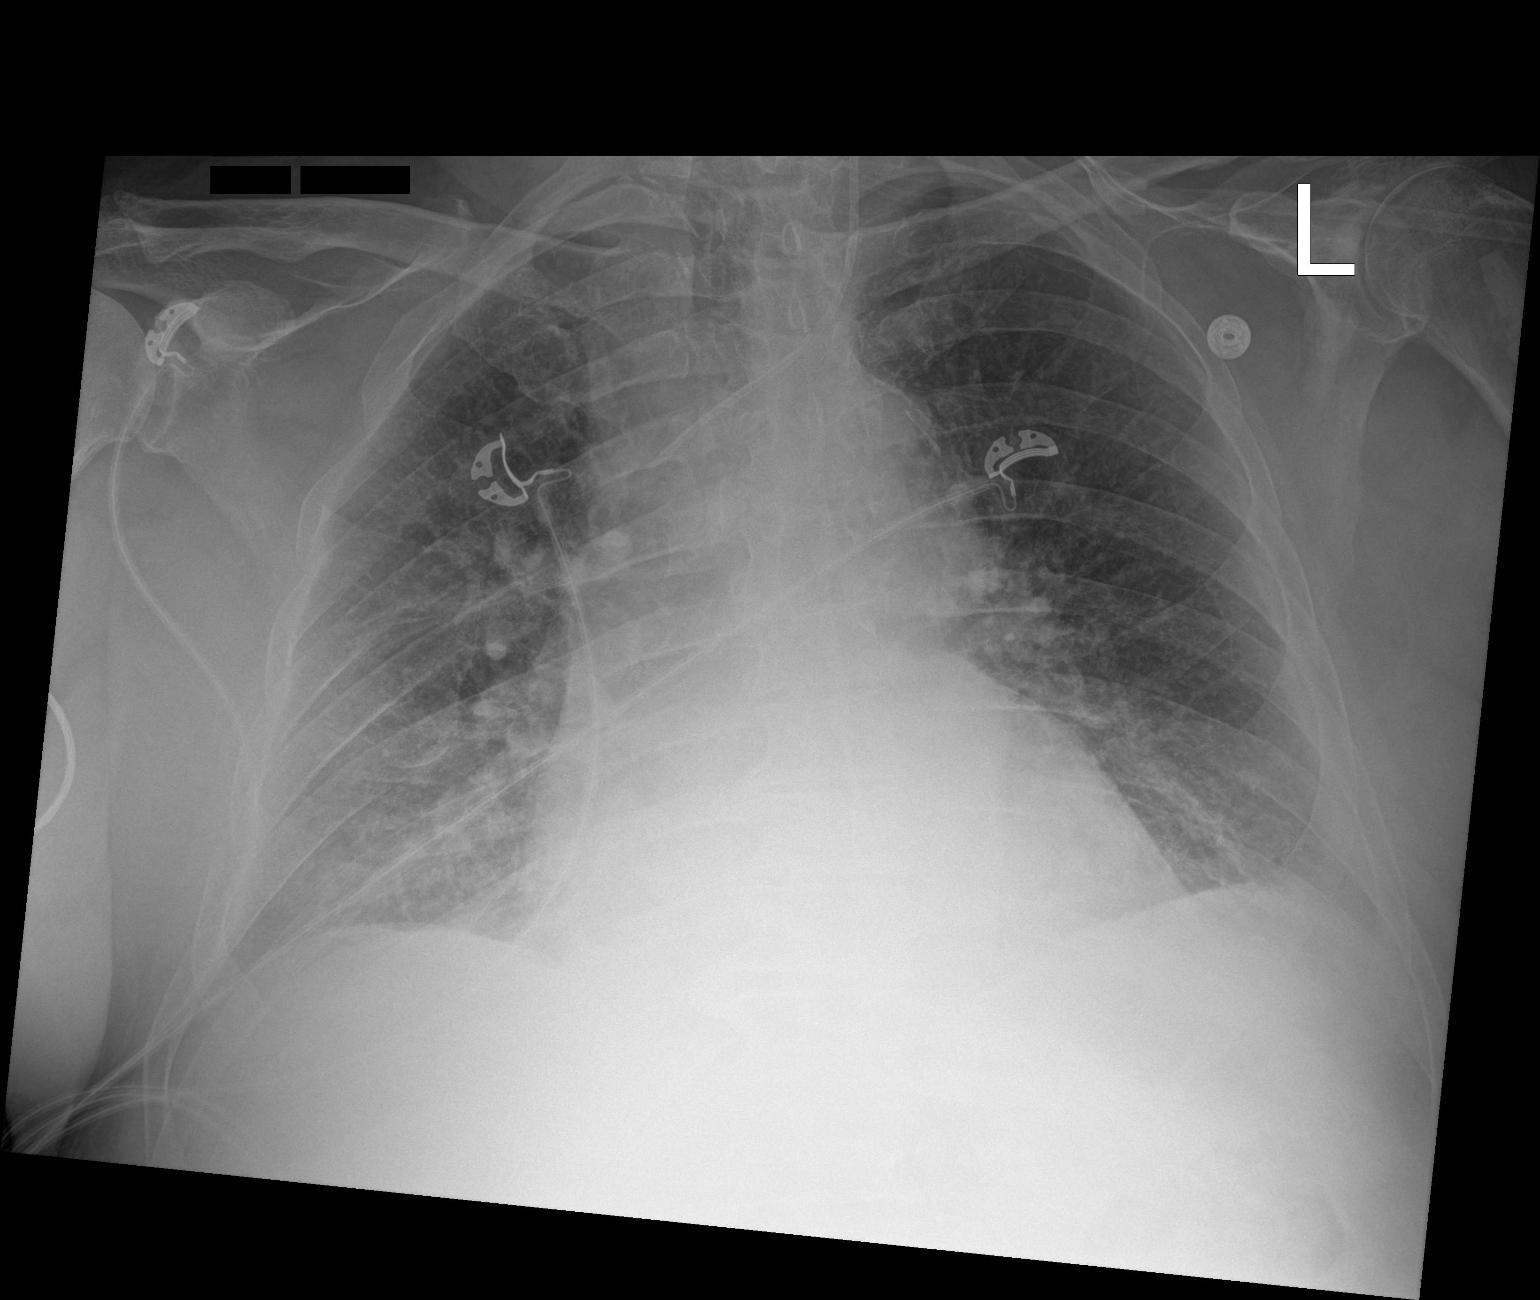

[1 of 1 positions shown; findings below may reference images not displayed]

FINDINGS: 9033 hours. Interval extubation and NG tube removal. Left IJ central
line tip overlies the innominate vein confluence. The cardio
pericardial silhouette is enlarged. There is pulmonary vascular
congestion without overt pulmonary edema. Basilar atelectasis noted
without substantial pleural effusion. Telemetry leads overlie the
chest.
IMPRESSION: 1. Interval extubation.
2. Pulmonary vascular congestion without overt pulmonary edema.
3. Bibasilar atelectasis.

## 2022-09-10 ENCOUNTER — Ambulatory Visit (INDEPENDENT_AMBULATORY_CARE_PROVIDER_SITE_OTHER): Payer: Medicare Other

## 2022-09-10 DIAGNOSIS — I5022 Chronic systolic (congestive) heart failure: Secondary | ICD-10-CM

## 2022-09-10 DIAGNOSIS — I255 Ischemic cardiomyopathy: Secondary | ICD-10-CM

## 2022-09-11 LAB — CUP PACEART REMOTE DEVICE CHECK
Battery Remaining Longevity: 66 mo
Battery Remaining Percentage: 87 %
Battery Voltage: 2.98 V
Brady Statistic AP VP Percent: 12 %
Brady Statistic AP VS Percent: 1 %
Brady Statistic AS VP Percent: 88 %
Brady Statistic AS VS Percent: 1 %
Brady Statistic RA Percent Paced: 11 %
Date Time Interrogation Session: 20240108010022
HighPow Impedance: 73 Ohm
Implantable Lead Connection Status: 753985
Implantable Lead Connection Status: 753985
Implantable Lead Connection Status: 753985
Implantable Lead Implant Date: 20230709
Implantable Lead Implant Date: 20230709
Implantable Lead Implant Date: 20230709
Implantable Lead Location: 753858
Implantable Lead Location: 753859
Implantable Lead Location: 753860
Implantable Pulse Generator Implant Date: 20230709
Lead Channel Impedance Value: 1025 Ohm
Lead Channel Impedance Value: 430 Ohm
Lead Channel Impedance Value: 610 Ohm
Lead Channel Pacing Threshold Amplitude: 0.75 V
Lead Channel Pacing Threshold Amplitude: 1 V
Lead Channel Pacing Threshold Amplitude: 2.125 V
Lead Channel Pacing Threshold Pulse Width: 0.5 ms
Lead Channel Pacing Threshold Pulse Width: 0.5 ms
Lead Channel Pacing Threshold Pulse Width: 0.5 ms
Lead Channel Sensing Intrinsic Amplitude: 12 mV
Lead Channel Sensing Intrinsic Amplitude: 3.6 mV
Lead Channel Setting Pacing Amplitude: 1.75 V
Lead Channel Setting Pacing Amplitude: 2.625
Lead Channel Setting Pacing Amplitude: 3.5 V
Lead Channel Setting Pacing Pulse Width: 0.5 ms
Lead Channel Setting Pacing Pulse Width: 0.5 ms
Lead Channel Setting Sensing Sensitivity: 0.5 mV
Pulse Gen Serial Number: 810036011

## 2022-10-12 NOTE — Progress Notes (Signed)
Remote ICD transmission.   

## 2022-10-17 ENCOUNTER — Ambulatory Visit (HOSPITAL_COMMUNITY): Payer: 59 | Attending: Cardiovascular Disease

## 2022-10-17 DIAGNOSIS — I5022 Chronic systolic (congestive) heart failure: Secondary | ICD-10-CM | POA: Insufficient documentation

## 2022-10-17 LAB — ECHOCARDIOGRAM COMPLETE
Area-P 1/2: 2.82 cm2
S' Lateral: 4.2 cm

## 2022-10-17 MED ORDER — PERFLUTREN LIPID MICROSPHERE
3.0000 mL | INTRAVENOUS | Status: AC | PRN
  Administered 2022-10-17: 3 mL via INTRAVENOUS

## 2022-10-26 ENCOUNTER — Ambulatory Visit: Payer: 59 | Attending: Cardiovascular Disease | Admitting: Cardiovascular Disease

## 2022-10-26 ENCOUNTER — Encounter: Payer: Self-pay | Admitting: Cardiovascular Disease

## 2022-10-26 VITALS — BP 112/54 | HR 60 | Ht 69.0 in | Wt 254.4 lb

## 2022-10-26 DIAGNOSIS — I2510 Atherosclerotic heart disease of native coronary artery without angina pectoris: Secondary | ICD-10-CM

## 2022-10-26 DIAGNOSIS — G4733 Obstructive sleep apnea (adult) (pediatric): Secondary | ICD-10-CM | POA: Diagnosis not present

## 2022-10-26 DIAGNOSIS — I255 Ischemic cardiomyopathy: Secondary | ICD-10-CM | POA: Diagnosis not present

## 2022-10-26 DIAGNOSIS — E1159 Type 2 diabetes mellitus with other circulatory complications: Secondary | ICD-10-CM

## 2022-10-26 DIAGNOSIS — Z95810 Presence of automatic (implantable) cardiac defibrillator: Secondary | ICD-10-CM

## 2022-10-26 DIAGNOSIS — Z720 Tobacco use: Secondary | ICD-10-CM

## 2022-10-26 DIAGNOSIS — J449 Chronic obstructive pulmonary disease, unspecified: Secondary | ICD-10-CM

## 2022-10-26 DIAGNOSIS — E669 Obesity, unspecified: Secondary | ICD-10-CM

## 2022-10-26 DIAGNOSIS — N184 Chronic kidney disease, stage 4 (severe): Secondary | ICD-10-CM

## 2022-10-26 DIAGNOSIS — E785 Hyperlipidemia, unspecified: Secondary | ICD-10-CM

## 2022-10-26 NOTE — Patient Instructions (Signed)
Medication Instructions:   No changes  *If you need a refill on your cardiac medications before your next appointment, please call your pharmacy*   Lab Work: Not needed   Testing/Procedures: Not needed  Follow-Up: At [facility], you and your health needs are our priority.  As part of our continuing mission to provide you with exceptional heart care, we have created designated Provider Care Teams.  These Care Teams include your primary Cardiologist (physician) and Advanced Practice Providers (APPs -  Physician Assistants and Nurse Practitioners) who all work together to provide you with the care you need, when you need it.     Your next appointment:   6 month(s) sleep  The format for your next appointment:   In Person  Provider:   Shelva Majestic, MD    Other Instructions

## 2022-10-26 NOTE — Progress Notes (Signed)
I patient ID: Bobby [name], male   DOB: Oct 15, 1945, 77 y.o.   MRN: [id number]       HPI: Bobby [name] is a 77 y.o. male presents to the office today for a 3 month followup evaluation.   Bobby [name]  has a long-standing tobacco history having started smoking at age 28, a history of hypertension, remote small TIA/CVA who presented to the hospital on 08/04/2012 in the setting of inferior ST segment elevation myocardial infarction. Catheterization by me revealed a 99% stenosis of the RCA and concomitant CAD involving his LAD and circumflex vessels. He underwent acute percutaneous coronary intervention with an excellent door to balloon time of only 20 minutes and ultimately insertion of a 3.25x32 mm DES stent post dilated 3.3. An echo done in the hospital showed an EF of 50-60% with mild basal inferior probable scar and moderate pulmonary hypertension with PA pressure of 45 mm.  When I saw Mr. Standard in May 2014, he did note shortness of breath with activity. He denied definitive chest pain. At that time, he realized that he had inadvertentlystopped taking the atorvastatin 40 mg dose and this was resumed. In addition, his Toprol dose was increased from 50 to 75 mg daily. A nuclear study showed an ejection fraction of 66%. Perfusion was essentially normal with exception of a small region of fixed inferoseptal bowel artifact and possible small area of inferobasilar scar.   Bobby [name] unfortunately continues to smoke at least a pack of cigarettes per day.   He does experience shortness of breath with activity.  He denies recent chest pain.  He has chronic right bundle branch block with repolarization changes.  He is diabetic and has pulmonary hypertension.  A follow-up nuclear perfusion study on 03/26/2014 remianed low risk and showed normal LV function and normal wall motion.  There was a fixed inferior defect that was felt most consistent with diaphragmatic attenuation.  There was no evidence for ischemia.  He  underwent prostate surgery by Dr. [name] without cardiovascular compromise.    A screening abdominal aortic ultrasound revealed a normal abdominal aorta.  Since I saw him in November 2017, he had an extensive history and has had recurrent hospitalizations he was hospitalized in October 2018 for left arm pain.  Troponin was negative.  In December 2018 he suffered an anterior ST segment elevation MI due to acute occlusion of his mid LAD and underwent successful PCI of his LAD with insertion of a 2.5 x 22 mm Resolute Onyx DES stent.  He was also found to have 99% dominant mid left circumflex lesion with 80% stenosis in the OM1 vessel and underwent staged intervention to the mid circumflex and marginal vessels.  Troponin was greater than 65.  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  There was mention of sludge is a sign of pre-thrombotic state but no definite thrombus was seen at the apex.  He returned to the hospital August 20, 2017 with malaise and dyspnea.  He was bradycardic.  He was diuresed.  He has renal insufficiency with creatinines increasing up to 1.9.  He has been followed by [name] on numerous occasions in the office.  Unfortunately continues to smoke cigarettes.  He admits to 1/2 pack/day.  He does note occasional dizziness as well as some shortness of breath.  He denies recurrent anginal type symptoms.   Suggestive of low flow state.  When I  saw him on Jan 07, 2018  I recommended that he undergo a follow-up echo Doppler study to reassess systolic and diastolic function as well as potential for apical thrombus formation.  He had continued to be on aspirin and Brilinta following his STEMI.  He was on atorvastatin 80 mg for hyperlipidemia with target less than 70.  I had a long discussion regarding smoking cessation.  He underwent a follow-up echo Doppler study on Jan 20, 2018.  This showed reduced LV function with an EF  of 35 to 40% with akinesis of the mid apical anterolateral and septal as well as apical walls.  There was grade 1 diastolic dysfunction.  There was now a possible small layered thrombus at the apex noted using Definity and also evidence of swirling at the LV apex suggestive of low flow state.   As result of the echo findings, I saw him in follow-up on Jan 24, 2018 at which time I had a very long discussion with him guarding potential thromboembolic stroke risk.  He was started on warfarin anticoagulation.  Apparently, the patient's medications have been in a  pack and had been followed by his girlfriend.  He was taking his medications correctly resulting in significant over anticoagulation with INR reached greater than 10.  He has been followed very closely by our pharmacy department and he also was given vitamin K.  Due to his poor medication compliance after much discussion ultimately the thought is for him to transition to Eliquis once his INR gets below 2.  He also has been taking his blood pressure medicines incorrectly.  Patient has been sleeping most of the day.  In the past I had recommended he undergo a sleep evaluation for sleep apnea which he never followed up with it was a no-show in the sleep lab.  He was worked into my schedule on Bobby 21, 2019 and was seen as an add-on due to his medication issues.  He was here now with his sister who in the past had done an excellent job in caring for his medications and she is committed now to resume doing this for him.  During his evaluation, he was exceptionally somnolent, and a significant time was spent with medication adjustment.  Due to his inability to take effect of warfarin the decision was made to use Eliquis for anticoagulation with more consistent dosing.  His recent laboratory had shown stage IV chronic kidney disease and it was recommended that he hold his lisinopril for several days and then reduce the dose to 5 mg as well as holding his  furosemide with subsequent reduction of dose with reinstitution at 20 mg.  I scheduled him for an expeditious sleep study due to concerns for significant sleep apnea and he was seen5 days later by Racquel with medication adjustment and anticoagulation issues.  He underwent a sleep study on Bobby 25, 2019 and he was found to have moderate overall sleep apnea with an AHI of 22.2 and RDI of 25.4.  He could not achieve any rem sleep and the overall severity may very well be underestimated.  He was titrated up to 10 cm water pressure.  An initial trial of CPAP auto with an EPR range of 8-15 was recommended.  When I last saw him on March 19, 2018 he had he had just initiated CPAP the evening before noted significant improvement in his sleep. His renal function had significantly improved from a creatinine of 2.7 down to 1.362 weeks ago.  He  is unaware of any recurrent episodes of arrhythmia.  Apparently his blood pressure had become elevated and Dr. Darron Doom added losartan to his medical regimen.  Of note, he also already was on lisinopril 2.5 mg, furosemide 20 mg, carvedilol 6.25 mg twice a day.  He has been only intermittently using CPAP due to complaints of the mask on his nose bridge.  As result, he was recently changed to a different mask.  A download was obtained in the office today in which he does not meet compliance.  [employer] is his DME company.  His set up date was March 17, 2018 and he has until June 17, 2018 to demonstrate compliance.    When I saw him in September 2019 his blood pressure was elevated.  At that time apparently he was on both the previous lisinopril and the recent losartan that was started by Dr. Darron Doom.  I suggested he discontinue lisinopril and further titrated losartan to 25 mg twice a day both for improved blood pressure and for his reduced LV function.  He was not compliant with reference to his CPAP therapy and I had a long discussion with him regarding the importance that he meet  compliance by October 15.  I saw him in October 2019.  At that time he was not using his CPAP therapy.  I reviewed his blood pressure recordings and they seem to be consistently elevated.  He continues to be on Eliquis and was taking Lasix 20 mg.  That evaluation I had a long discussion with him guarding his noncompliance with CPAP.  I discussed alternatives such as customized oral appliance.  We discussed with his LV dysfunction he may be a candidate for Jardiance if his renal function is stable.  Was maintaining sinus rhythm.  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  His ECG was unchanged.  Troponins were negative.  He was discharged from the ER and seen by [name], [facility] on October 01, 2018.  He had not had any further chest pain since he left the hospital and no ischemic work-up was planned.  His renal function had worsened and his furosemide was changed to every other day.  Visibly he feels well and has not had recurrent chest pain.  He uses oxygen at 2 L nasal cannula at nighttime instead of his CPAP therapy.  Laboratory 2 days prior to his office visit has shown a creatinine of 2.07 with a BUN of 37.  Potassium was 5.4.   I saw him in February 2020.  At that time his blood pressure was stable and he was taken furosemide 20 mg every other day, carvedilol 18.75 mg twice a day, and valsartan 40 mg.  Renal function improved with discontinuance of furosemide.  I saw him in November 2020 when he came to the office with his sister. He was on supplemental oxygen at nighttime but has not been using CPAP.  Upon further questioning he had difficulty with the mask on the bridge of his nose.  Recently, his blood pressure has been elevated and he has been of seen by Dr. Darron Doom.  He apparently was now on losartan 50 mg in place of valsartan, carvedilol 18.75 mg twice a day,  and he is on Plavix in addition to apixaban 5 mg twice a day.  He continued to be on atorvastatin 80  mg daily for hyperlipidemia.  He is diabetic on metformin.  He is on bupropion 300 mg for depression.  During that evaluation, his blood pressure was elevated at 162/90 I recommended further titration of carvedilol to 25 mg twice a day and added amlodipine 5 mg.  I again discussed the importance of using CPAP therapy.  He had stage III chronic kidney disease with creatinine improving from 2.07 to 1.77.  I saw him in May 2021 at which time any chest pain.He was treated with an infection by Dr. Darron Doom and at times has had some recurrent congestion.  He is breathing better.  He uses supplemental oxygen but admits that at times he does not use it every night.  Unfortunately he still smoking a pack of cigarettes lasting 2 to 2-1/2 days.  Laboratory on November 05, 2019 showed a creatinine at 2.25.  He has recently been on [employer] in addition to albuterol for wheezing.  However he had was not consistently using his combination drug treatment.    I saw him in May 02, 2020.  Over the prior months he continued to be stable from a cardiac standpoint.   He underwent a follow-up echo Doppler study on Bobby 14, 2021 which showed an EF of 35 to 40%.  There was mild LVH of the basal septal segment.  There was grade 1 diastolic dysfunction.  He had previously noted akinesis of the apex and basal inferior wall and mid apical anteroseptal wall.  There was no evidence for residual thrombus.  Last week he under went right neck melanoma resection by Dr. Martin Majestic.  He has further decreased his tobacco use.    He was evaluated by Kerin Ransom in March 2022.  I last saw him on May 11, 2021 at which time he remained relatively stable but admitted to rare episodes of chest discomfort occurring approximately every 1 to 1-1/2 months.  Unfortunately he continues to smoke cigarettes.  He believes he is sleeping adequately.  His  primary physician, Dr. Darron Doom had checked laboratory July 08, 2020.  Creatinine was increased at 1.94.  He is unaware of palpitations, presyncope or syncope.  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  He had anteroseptal Q waves consistent with his prior MI.  I recommended follow-up laboratory and a follow-up echo Doppler assessment.  He was evaluated by pulmonary on June 15, 2019.[name]  He has COPD and dyspnea which is multifactorial as result of his combined systolic and diastolic heart failure, COPD, CAD, and cardiac deconditioning.  I last saw him on July 17, 2021.  His echo Doppler from June 05, 2021 showed an EF of 30 to 35% with previously noted septal apical akinesis and inferior wall hypokinesis, mild mitral regurgitation, and mild to moderate aortic valve sclerosis without stenosis.  He has continued to be on Eliquis for anticoagulation.  Laboratory had shown elevation of potassium  potassium at 5.5 and creatinine increased to 2.36.  He was advised to hold losartan as well as discontinue spironolactone.    I saw him on September 05, 2021.  At that time he was experiencing occasional shortness of breath.  He was evaluated by Gerald Leitz, NP pulmonary on August 22, 2021 as part of the lung cancer screening.  Chest CT on December 21 showed tiny pulmonary nodules felt to be long RADS 2 benign appearance.  He also was noted to have coronary atherosclerosis.  He was  recently [facility] on January 1, diagnosed with pneumonia and discharged with doxycycline.  He denies any chest tightness or pressure.  He has not been using CPAP therapy.  Follow-up laboratory was recommended and a more recent creatinine had improved to 1.64  He was hospitalized from January 5 through September 11, 2021 with hypoxemia, initially required BiPAP but he became unresponsive and was ultimately intubated.  He was  felt to have septic shock and required pressor therapy.  He ultimately stabilized and remained on antibiotic therapy with ceftriaxone and azithromycin in addition to prednisone.  I last saw him on Jan 09, 2022.  Since his hospitalization, he has felt well.  He sees his primary physician Dr. Horald Pollen regularly.  He recently was started on furosemide 40 mg by Dr. Wynetta Emery.  He continues to smoke.  He denies chest pain.  He does not use CPAP.  He continued to be on low-dose amlodipine 5 mg, carvedilol 25 mg twice a day and low-dose hydralazine 10 mg twice a day.  He was on low-dose prednisone at 10 mg and on atorvastatin 80 mg.  Since I saw him, he presented to the hospital on March 09, 2022 after an episode of syncope.  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  Due to his history of heart failure he underwent insertion of an Abbott CRT-D device on March 11, 2022 by Dr. Curt Bears.  He has subsequently seen Dr. Curt Bears on June 13, 2022 in the office and was remaining stable.  ECG showed sinus rhythm V paced at a rate of 60.  His device was functioning appropriately.  I last saw him on July 18, 2022.  At that time Mr. Uddin denied any chest pain or significant shortness of breath.  He no longer uses CPAP therapy for his obstructive sleep apnea.  He is breathing much better.  Unfortunately he is still smoking with a pack lasting 2 to 3 days.  He is now on a regimen of amlodipine 5 mg, carvedilol 25 mg twice a day, hydralazine 50 mg every 8 hours, furosemide 40 mg daily isosorbide 30 mg daily and is on atorvastatin 80 mg for hyperlipidemia.  He is anticoagulated on Eliquis 5 mg twice a day.  He is on Trelegy Ellipta and albuterol for COPD and continues to be on low-dose prednisone 10 mg daily.   At his last office visit, I recommended Mr. Groseclose have a follow-up 2D echo Doppler study.  His echo was done on October 17, 2022.  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  There was mild LVH.  There is moderate LA dilation.  There was no evidence for apical thrombus.  Presently, Mr. Hellwig feels relatively well.  He continues to smoke half pack of cigarettes per day.  He underwent right eye cataract surgery yesterday by Dr. Satira Sark and is scheduled to undergo left eye cataract surgery in March.  He underwent laboratory in July 18, 2022 which showed stage IV CKD with BUN 66 creatinine 2.43. Lipid studies in July 2023 showed total cholesterol 123, triglycerides 89, HDL 39 LDL 66.  He denies any chest pain.  He denies any awareness of arrhythmia.  There is no recent bleeding.  He presents for follow-up evaluation.   Past Medical History:  Diagnosis Date   DM (diabetes mellitus), type 2 new diagnosis 08/05/2012   diet control   ED (erectile dysfunction)    surgery  planned   Hyperlipidemia 08/05/2012   Hypertensive heart disease    Panic attack    Pulmonary HTN (HCC)    echo 08/05/12, EF 55-60%, PA pressure 68m   S/P coronary artery stent placement, to RCA Promus DES 08/05/2012   Stroke ([facility]    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   Tobacco abuse     Past Surgical History:  Procedure Laterality Date   BIV ICD INSERTION CRT-D N/A 03/11/2022   Procedure: BIV ICD INSERTION CRT-D;  Surgeon: CConstance Haw MD;  [facility];  Service: Cardiovascular;  Laterality: N/A;   CARDIAC CATHETERIZATION  08/04/12   PCI to RCA with DES   CORONARY STENT INTERVENTION N/A 08/12/2017   Procedure: CORONARY STENT INTERVENTION;  Surgeon: JMartinique Peter M, MD;  [facility];  Service: Cardiovascular;  Laterality: N/A;   CORONARY/GRAFT ACUTE MI REVASCULARIZATION N/A 08/09/2017   Procedure: Coronary/Graft Acute MI Revascularization;  Surgeon: SBelva Crome MD;  [facility];  Service: Cardiovascular;  Laterality: N/A;   LEFT HEART CATH AND CORONARY ANGIOGRAPHY N/A 08/09/2017   Procedure: LEFT HEART CATH AND CORONARY  ANGIOGRAPHY;  Surgeon: SBelva Crome MD;  [facility];  Service: Cardiovascular;  Laterality: N/A;   LEFT HEART CATHETERIZATION WITH CORONARY ANGIOGRAM N/A 08/04/2012   Procedure: LEFT HEART CATHETERIZATION WITH CORONARY ANGIOGRAM;  Surgeon: TTroy Sine MD;  [facility];  Service: Cardiovascular;  Laterality: N/A;   LEG SURGERY Left    rod placed for fracture repair   PENILE PROSTHESIS IMPLANT N/A 06/03/2014   Procedure: IMPLANT PENILE PROTHESIS INFLATABLE;  Surgeon: SAilene Rud MD;  Location: [facility];  Service: Urology;  Laterality: N/A;  with penile block--0.5% marcaine plain   PERCUTANEOUS CORONARY STENT INTERVENTION (PCI-S) Right 08/04/2012   Procedure: PERCUTANEOUS CORONARY STENT INTERVENTION (PCI-S);  Surgeon: TTroy Sine MD;  [facility];  Service: Cardiovascular;  Laterality: Right;   TEMPORARY PACEMAKER N/A 03/08/2022   Procedure: TEMPORARY PACEMAKER;  Surgeon: SBelva Crome MD;  [facility];  Service: Cardiovascular;  Laterality: N/A;   THUMB ARTHROSCOPY Left    thumb joint replaced    Allergies  Allergen Reactions   Hydrocodone Itching    Current Outpatient Medications  Medication Sig Dispense Refill   acetaminophen (TYLENOL) 325 MG tablet Take 2 tablets (650 mg total) by mouth every 4 (four) hours as needed for headache or mild pain.     albuterol (PROVENTIL HFA;VENTOLIN HFA) 108 (90 Base) MCG/ACT inhaler Inhale 2 puffs into the lungs every 6 (six) hours as needed for wheezing or shortness of breath. 1 Inhaler 2   amLODipine (NORVASC) 5 MG tablet TAKE ONE TABLET BY MOUTH DAILY 180 tablet 3   apixaban (ELIQUIS) 5 MG TABS tablet Take 1 tablet (5 mg total) by mouth 2 (two) times daily. 28 tablet 1   aspirin EC 81 MG tablet Take 1 tablet (81 mg total) by mouth daily. Swallow whole. 90 tablet 3   atorvastatin (LIPITOR) 80 MG tablet Take 1 tablet (80 mg total) by mouth daily at 6 PM. 90 tablet 3   BREZTRI  AEROSPHERE 160-9-4.8 MCG/ACT AERO Inhale 2 puffs into the lungs 2 (two) times daily.     buPROPion (WELLBUTRIN XL) 300 MG 24 hr tablet Take 300 mg by mouth daily.     carvedilol (COREG) 25 MG tablet TAKE ONE TABLET BY MOUTH TWICE DAILY 180 tablet 3   clonazePAM (KLONOPIN) 1 MG  tablet Take 1 mg by mouth 2 (two) times daily as needed for anxiety.     Coenzyme Q10 (COQ10 PO) Take 1 capsule by mouth daily.     COLCRYS 0.6 MG tablet Take 0.6 mg by mouth daily.  3   ferrous sulfate 325 (65 FE) MG EC tablet Take 325 mg by mouth 2 (two) times a week.     furosemide (LASIX) 40 MG tablet Take 40 mg by mouth daily.     hydrALAZINE (APRESOLINE) 50 MG tablet Take 1 tablet (50 mg total) by mouth every 8 (eight) hours. 90 tablet 6   isosorbide mononitrate (IMDUR) 30 MG 24 hr tablet Take 1 tablet (30 mg total) by mouth daily. 30 tablet 6   nitroGLYCERIN (NITROSTAT) 0.4 MG SL tablet DISSOLVE 1 TABLET UNDER THE TONGUE EVERY 5 MINUTES AS NEEDED FOR CHEST PAIN. DO NOT EXCEED A TOTAL OF 3 DOSES IN 15 MINUTES. 90 tablet 3   Omega-3 Fatty Acids (FISH OIL PO) Take 1 tablet by mouth daily.     oxyCODONE-acetaminophen (PERCOCET) 10-325 MG per tablet Take 1 tablet by mouth every 6 (six) hours as needed for pain.     predniSONE (DELTASONE) 10 MG tablet Take 10 mg by mouth daily.     sertraline (ZOLOFT) 100 MG tablet Take 100 mg by mouth daily.     TRELEGY ELLIPTA 200-62.5-25 MCG/ACT AEPB Inhale 1 puff into the lungs daily.     Vitamin D, Ergocalciferol, (DRISDOL) 1.25 MG (50000 UNIT) CAPS capsule Take 50,000 Units by mouth 2 (two) times a week.     No current facility-administered medications for this visit.    Socially,  he is divorced. He doesn't walk but not routinely exercise.  He continues to smoke one pack of cigarettes per day.  ROS General: Negative; No fevers, chills, or night sweats;  HEENT: Negative; No changes in vision or hearing, sinus congestion, difficulty swallowing Pulmonary:  Positive for shortness  of breath; COPD Cardiovascular: See history of present illness GI: Negative; No nausea, vomiting, diarrhea, or abdominal pain GU: Negative; No dysuria, hematuria, or difficulty voiding Musculoskeletal: Negative; no myalgias, joint pain, or weakness Hematologic/Oncology: Negative; no easy bruising, bleeding Endocrine: Positive for diabetes no heat/cold intolerance;  Neuro: Negative; no changes in balance, headaches Skin: Negative; No rashes or skin lesions Psychiatric: Negative; No behavioral problems, depression Sleep: obstructive sleep apnea, CPAP initiated March 17, 2018; currently not on therapy. no bruxism, restless legs, hypnogognic hallucinations, no cataplexy Other comprehensive 14 point system review is negative.   PE BP (!) 112/54 (BP Location: Left Arm, Patient Position: Sitting, Cuff Size: Normal)   Pulse 60   Ht 5' 9"$  (1.753 m)   Wt 254 lb 6.4 oz (115.4 kg)   SpO2 95%   BMI 37.57 kg/m    Repeat blood pressure by me was 110/58  Wt Readings from Last 3 Encounters:  10/26/22 254 lb 6.4 oz (115.4 kg)  07/18/22 249 lb (112.9 kg)  06/13/22 239 lb 12.8 oz (108.8 kg)   General: Alert, oriented, no distress.  Skin: normal turgor, no rashes, warm and dry HEENT: Normocephalic, atraumatic. Pupils equal round and reactive to light; sclera anicteric; extraocular muscles intact;  Nose without nasal septal hypertrophy Mouth/Parynx benign; Mallinpatti scale 3 Neck: No JVD, no carotid bruits; normal carotid upstroke Lungs: clear to ausculatation and percussion; no wheezing or rales Chest wall: without tenderness to palpitation Heart: PMI not displaced, RRR, s1 s2 normal, 1/6 systolic murmur, no diastolic murmur, no rubs, gallops, thrills,  or heaves Abdomen: soft, nontender; no hepatosplenomehaly, BS+; abdominal aorta nontender and not dilated by palpation. Back: no CVA tenderness Pulses 2+ Musculoskeletal: full range of motion, normal strength, no joint deformities Extremities: no  clubbing cyanosis or edema, Homan's sign negative  Neurologic: grossly nonfocal; Cranial nerves grossly wnl Psychologic: Normal mood and affect  October 26, 2022  ECG (independently read by me): AV paced at 60; PR 226 msec  July 18, 2022 ECG (independently read by me): A paced and sensed, V paced at 61, Anterior Q waves  May 9, 2023ECG (independently read by me):  Sinus bradycardia at 53, LAD, RBBB, Q waves inferiorly and anteriorly    September 05, 2021 ECG (independently read by me):  NSR at 84, LAD, RBBB, LVH Q waves V3-6, II, aVF  July 17, 2021 ECG (independently read by me):  NSR at 62, LAD, RBBB, old inferior and anterior infarct  May 11, 2021 ECG (independently read by me): Normal sinus rhythm at 76 bpm, right bundle branch block, left anterior hemiblock.  No ectopy.  Anteroseptal Q waves consistent with prior anterior MI  May 02, 2020 ECG (independently read by me): Normal sinus rhythm at 69 bpm, bifascicular block with right bundle branch block and left anterior hemiblock.  On anteroseptal Q waves and inferior Q waves consistent with prior MIs.  Mild T wave abnormality laterally.  QTc interval 432 ms.     May 21, 2021ECG (independently read by me): Sinus bradycarrdia at 57; RBBB, Q V1-3 ands avF  July 24, 2019 ECG (independently read by me): Normal sinus rhythm at 80 bpm.  Right bundle branch block; previously noted Q waves V1 through V4 and 2 3 and F consistent with prior infarct  October 15, 2018 ECG (independently read by me): Normal sinus rhythm at 93 bpm with baseline artifact.  Right bundle branch block.  Anterior Q waves consistent with prior infarct.  Inferior Q waves.  ECG (independently read by me): Sinus rhythm at 65 bpm.  PAC.  Right bundle branch block, inferior Q waves in 3 and aVF, old anterior myocardial infarction.  Lateral T wave abnormality.  Normal intervals.  June 16, 2018 ECG (independently read by me): Normal sinus rhythm at 84 bpm.   Right bundle branch block with repolarization changes.  Inferior Q waves consistent with prior infarct.  Anteroseptal Q waves consistent with prior anterior MI.  May 28, 2018 ECG (independently read by me): Normal sinus rhythm with sinus arrhythmia.  Right bundle branch block with repolarization changes.  Q waves consistent with anteroseptal MI and possible inferior MI no indication to adjust the pain upon his needs  July 17,2019 ECG (independently read by me): Normal sinus rhythm 89 bpm.  Right bundle branch block with repolarization changes.  Inferior Q waves and anterior  Q waves consistent with anterolateral infarct with possible inferior infarct  Bobby 21, 2019 ECG (independently read by me): Normal sinus rhythm at 95 bpm, right bundle branch block with repolarization changes.  Anterolateral MI.  QTc interval 477 ms  Jan 24, 2018 ECG (independently read by me): Sinus rhythm at 97 bpm.  Right bundle branch block with repolarization changes.  QTc interval 472 ms.  Anterolateral Q waves  Jan 07, 2018 ECG (independently read by me): Normal sinus rhythm at 88 bpm.  Right bundle branch block with repolarization changes.  Inferior Q waves.  T wave anteroseptally, possible anteroseptal MI undetermined.  November 2017 ECG (independently read by me): Normal sinus rhythm at 64 bpm.  Branch block with repolarization changes.  QTc interval 460 ms.  Bobby 2016 ECG (independently read by me): Sinus bradycardia at 57 bpm, right bundle branch block with repositioning changes.  QTc interval 459 ms.  September 2015 ECG (independently read by me and (: Normal sinus rhythm.  Right bundle branch block with repolarization changes.  Ventricular rate 81.  02/25/2014 ECG Normal sinus rhythm.  Right bundle branch block with repolarization changes.  PR interval 148 ms; QTc interval 463 ms  Prior ECG: sinus rhythm at 76 beats per minute; right bundle-branch block with repolarization changes. PR interval 142 ms, QTC  481 ms.   LABS:     Latest Ref Rng & Units 07/18/2022   12:36 PM 03/12/2022    1:24 AM 03/11/2022   12:30 AM  BMP  Glucose 70 - 99 mg/dL 109  126  162   BUN 8 - 27 mg/dL 66  31  37   Creatinine 0.76 - 1.27 mg/dL 2.43  1.97  2.26   BUN/Creat Ratio 10 - 24 27     Sodium 134 - 144 mmol/L 141  140  140   Potassium 3.5 - 5.2 mmol/L 4.5  4.2  4.1   Chloride 96 - 106 mmol/L 104  108  110   CO2 20 - 29 mmol/L 22  24  21   $ Calcium 8.6 - 10.2 mg/dL 10.0  9.5  9.5       Latest Ref Rng & Units 07/18/2022   12:36 PM 03/08/2022    1:05 PM 09/07/2021    6:00 AM  Hepatic Function  Total Protein 6.0 - 8.5 g/dL 6.7  6.2  7.0   Albumin 3.8 - 4.8 g/dL 4.5  3.6  4.0   AST 0 - 40 IU/L 22  20  18   $ ALT 0 - 44 IU/L 28  31  20   $ Alk Phosphatase 44 - 121 IU/L 113  89  130   Total Bilirubin 0.0 - 1.2 mg/dL 0.2  0.7  0.5       Latest Ref Rng & Units 03/12/2022    1:24 AM 03/11/2022   12:30 AM 03/09/2022    2:52 AM  CBC  WBC 4.0 - 10.5 K/uL 10.8  9.9  10.1   Hemoglobin 13.0 - 17.0 g/dL 11.8  12.1  12.5   Hematocrit 39.0 - 52.0 % 36.7  37.7  38.8   Platelets 150 - 400 K/uL 136  140  152    Lab Results  Component Value Date   MCV 87.8 03/12/2022   MCV 90.2 03/11/2022   MCV 89.0 03/09/2022   Lab Results  Component Value Date   TSH 1.130 03/08/2022   Lipid Panel     Component Value Date/Time   CHOL 123 03/09/2022 0252   CHOL 141 05/30/2021 1050   TRIG 89 03/09/2022 0252   HDL 39 (L) 03/09/2022 0252   HDL 40 05/30/2021 1050   CHOLHDL 3.2 03/09/2022 0252   VLDL 18 03/09/2022 0252   LDLCALC 66 03/09/2022 0252   LDLCALC 77 05/30/2021 1050   INR result since initiation of Coumadin: 1:> 7.6;> >10;> 8.6 (Vit K administered) >4.3; no longer on Coumadin, now on Eliquis  RADIOLOGY: No results found.  IMPRESSION: 1. Ischemic cardiomyopathy   2. Coronary artery disease involving native coronary artery of native heart without angina pectoris   3. OSA (obstructive sleep apnea)   4. Cardiac  resynchronization therapy defibrillator (CRT-D) in place   5. Tobacco abuse  6. Type 2 diabetes mellitus with other circulatory complication, unspecified whether long term insulin use ([place])   7. Hyperlipidemia LDL goal <70   8. CKD (chronic kidney disease), stage IV (HCC)   9. Obesity (BMI 30-39.9)   10. Chronic obstructive pulmonary disease, unspecified COPD type ([place])     ASSESSMENT AND PLAN:  Mr. Crunk is a 77 year old Caucasian male who suffered an acute coronary syndrome on 08/04/2012 when he presented with subtotal occlusion of his RCA. He had diffuse disease beyond the subtotal occlusion and ultimately had successful insertion of a 3.25x32 mm Promus DES stent. He had concomitant CAD with 40-50% proximal LAD narrowing, 30% circumflex marginal stenoses. NMR lipoprofile after initiation of atorvastatin in January 2014 showed marked improvement in total cholesterol of 119 LDL 48 LDL particle #992. HDL was very low at 30 with an increased triglyceride at 203 and significantly reduced HDL particle #24.2. His insulin resistance score was elevated at 65.  He suffered an anterior STEMI in December 2018 and underwent successful stenting and several days later he required staged intervention to circumflex and marginal vessel. 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  Warfarin was initially prescribed but due to significant issues with compliance and overmedication the decision was made that he would be safer using Eliquis even though he is on this for apical thrombus rather than atrial fibrillation.   He was subsequently found to have significant obstructive sleep apnea but stopped therapy and uses nocturnal oxygen.  An echo Doppler study in Bobby 2021 showed an EF at 35 to 40% with moderate dilation of LV internal dimensions.  There was mild LVH and grade 1 diastolic dysfunction.  There was evidence for moderate aortic sclerosis with mild mitral annular calcification.  There  was no evidence for his previous apical thrombus.  An echo study from June 05, 2021 continued to show reduced EF at 30 to 35%, mild mitral regurgitation, and mild to moderate aortic sclerosis without stenosis.  Previously, he had developed worsening renal function and hyperkalemia necessitating discontinuance of losartan and spironolactone.   He was hospitalized in January 2023 with significant hypoxia, required intubation, but was felt to have septic shock for which he was treated with a brief course of pressor therapy.  He was treated with ceftriaxone and azithromycin in addition to prednisone.  He was subsequently seen by his primary physician Dr. Darron Doom on several occasions.  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  With his history of heart failure he underwent successful BiV ICD insertion CRT-D by Dr. Curt Bears.  Presently, he is feeling well without chest pain or shortness of breath and is breathing better.  Presently, he is stable without chest pain or significant change in shortness of breath.  His blood pressure is on the low side but stable at 110/58 when checked by me.  He has stage IV CKD with creatinine at 2.43 and GFR 27.  He is not a candidate for Entresto, or spironolactone.  Clinically he is doing well on his current regimen of amlodipine 5 mg, carvedilol 25 mg twice a day, furosemide 40 mg daily, hydralazine 50 mg every 8 hours, and isosorbide 30 mg daily.  He continues to be on atorvastatin 80 mg for hyperlipidemia.  For COPD he is on Trelegy Ellipta and [employer].  He no longer uses CPAP.  He continues to be on Wellbutrin and as needed  clonazepam for anxiety and depression.  I again discussed the importance of smoking cessation.  I will see him in 6 months for follow-up evaluation or sooner as needed.   Troy Sine, MD, [facility]  10/26/2022 12:16 PM

## 2022-11-05 NOTE — Progress Notes (Signed)
This encounter was created in error - please disregard.

## 2022-12-10 ENCOUNTER — Ambulatory Visit (INDEPENDENT_AMBULATORY_CARE_PROVIDER_SITE_OTHER): Payer: 59

## 2022-12-10 DIAGNOSIS — I5022 Chronic systolic (congestive) heart failure: Secondary | ICD-10-CM

## 2022-12-10 DIAGNOSIS — I255 Ischemic cardiomyopathy: Secondary | ICD-10-CM | POA: Diagnosis not present

## 2022-12-10 LAB — CUP PACEART REMOTE DEVICE CHECK
Battery Remaining Longevity: 62 mo
Battery Remaining Percentage: 84 %
Battery Voltage: 2.98 V
Brady Statistic AP VP Percent: 11 %
Brady Statistic AP VS Percent: 1 %
Brady Statistic AS VP Percent: 89 %
Brady Statistic AS VS Percent: 1 %
Brady Statistic RA Percent Paced: 11 %
Date Time Interrogation Session: 20240408030202
HighPow Impedance: 80 Ohm
Implantable Lead Connection Status: 753985
Implantable Lead Connection Status: 753985
Implantable Lead Connection Status: 753985
Implantable Lead Implant Date: 20230709
Implantable Lead Implant Date: 20230709
Implantable Lead Implant Date: 20230709
Implantable Lead Location: 753858
Implantable Lead Location: 753859
Implantable Lead Location: 753860
Implantable Pulse Generator Implant Date: 20230709
Lead Channel Impedance Value: 1025 Ohm
Lead Channel Impedance Value: 440 Ohm
Lead Channel Impedance Value: 680 Ohm
Lead Channel Pacing Threshold Amplitude: 0.75 V
Lead Channel Pacing Threshold Amplitude: 1 V
Lead Channel Pacing Threshold Amplitude: 2.5 V
Lead Channel Pacing Threshold Pulse Width: 0.5 ms
Lead Channel Pacing Threshold Pulse Width: 0.5 ms
Lead Channel Pacing Threshold Pulse Width: 0.5 ms
Lead Channel Sensing Intrinsic Amplitude: 12 mV
Lead Channel Sensing Intrinsic Amplitude: 4.7 mV
Lead Channel Setting Pacing Amplitude: 1.75 V
Lead Channel Setting Pacing Amplitude: 3 V
Lead Channel Setting Pacing Amplitude: 3.5 V
Lead Channel Setting Pacing Pulse Width: 0.5 ms
Lead Channel Setting Pacing Pulse Width: 0.5 ms
Lead Channel Setting Sensing Sensitivity: 0.5 mV
Pulse Gen Serial Number: 810036011

## 2022-12-14 ENCOUNTER — Ambulatory Visit
Admission: RE | Admit: 2022-12-14 | Discharge: 2022-12-14 | Disposition: A | Discharge status: Home / Self Care | Payer: 59 | Source: Ambulatory Visit | Attending: Family Medicine | Admitting: Family Medicine

## 2022-12-14 ENCOUNTER — Other Ambulatory Visit: Payer: Self-pay | Admitting: Family Medicine

## 2022-12-14 DIAGNOSIS — M545 Low back pain, unspecified: Secondary | ICD-10-CM

## 2023-01-03 ENCOUNTER — Telehealth: Payer: Self-pay | Admitting: Cardiovascular Disease

## 2023-01-03 DIAGNOSIS — N183 Chronic kidney disease, stage 3 unspecified: Secondary | ICD-10-CM

## 2023-01-03 NOTE — Telephone Encounter (Signed)
Patient sister wants to know about this medication as patient was taken it and he is now out.  She states spoke with PCP and states that Dr Tresa Endo would be filling this from now on. Spironolactone 25 mg once daily. He has not had it in 3 weeks.  She states yesterday he had some SOB, but improved. No edema.  She ask regards to mediation as she "dropped the ball" and was waiting on the PCP response.  She states she understood Dr Hal Hope was to call Dr Tresa Endo to let him know to prescribe.  Please advise

## 2023-01-03 NOTE — Telephone Encounter (Signed)
Pt sister would like a callback regarding her speaking with the PCP who stated they'd contact the office to have pt start on Spironolactone. Please advise.

## 2023-01-07 NOTE — Telephone Encounter (Signed)
I would recommend repeat CMET prior to considering restarting spironolactone.

## 2023-01-08 NOTE — Telephone Encounter (Signed)
LVM to please return call to office

## 2023-01-10 NOTE — Telephone Encounter (Signed)
Spoke with sister and she states understanding of the lab prior to restart of medication.  She will have him come to office for lab draw.

## 2023-01-12 LAB — COMPREHENSIVE METABOLIC PANEL
ALT: 31 IU/L (ref 0–44)
AST: 25 IU/L (ref 0–40)
Albumin/Globulin Ratio: 2.1 (ref 1.2–2.2)
Albumin: 4.4 g/dL (ref 3.8–4.8)
Alkaline Phosphatase: 141 IU/L — ABNORMAL HIGH (ref 44–121)
BUN/Creatinine Ratio: 20 (ref 10–24)
BUN: 55 mg/dL — ABNORMAL HIGH (ref 8–27)
Bilirubin Total: 0.2 mg/dL (ref 0.0–1.2)
CO2: 21 mmol/L (ref 20–29)
Calcium: 9.5 mg/dL (ref 8.6–10.2)
Chloride: 104 mmol/L (ref 96–106)
Creatinine, Ser: 2.76 mg/dL — ABNORMAL HIGH (ref 0.76–1.27)
Globulin, Total: 2.1 g/dL (ref 1.5–4.5)
Glucose: 104 mg/dL — ABNORMAL HIGH (ref 70–99)
Potassium: 4.8 mmol/L (ref 3.5–5.2)
Sodium: 140 mmol/L (ref 134–144)
Total Protein: 6.5 g/dL (ref 6.0–8.5)
eGFR: 23 mL/min/{1.73_m2} — ABNORMAL LOW (ref >59)

## 2023-01-15 NOTE — Progress Notes (Signed)
Remote ICD transmission.   

## 2023-03-11 ENCOUNTER — Ambulatory Visit (INDEPENDENT_AMBULATORY_CARE_PROVIDER_SITE_OTHER): Payer: 59

## 2023-03-11 DIAGNOSIS — I5022 Chronic systolic (congestive) heart failure: Secondary | ICD-10-CM

## 2023-03-11 DIAGNOSIS — I255 Ischemic cardiomyopathy: Secondary | ICD-10-CM | POA: Diagnosis not present

## 2023-03-11 LAB — CUP PACEART REMOTE DEVICE CHECK
Battery Remaining Longevity: 58 mo
Battery Remaining Percentage: 80 %
Battery Voltage: 2.96 V
Brady Statistic AP VP Percent: 11 %
Brady Statistic AP VS Percent: 1 %
Brady Statistic AS VP Percent: 89 %
Brady Statistic AS VS Percent: 1 %
Brady Statistic RA Percent Paced: 11 %
Date Time Interrogation Session: 20240708020006
HighPow Impedance: 80 Ohm
Implantable Lead Connection Status: 753985
Implantable Lead Connection Status: 753985
Implantable Lead Connection Status: 753985
Implantable Lead Implant Date: 20230709
Implantable Lead Implant Date: 20230709
Implantable Lead Implant Date: 20230709
Implantable Lead Location: 753858
Implantable Lead Location: 753859
Implantable Lead Location: 753860
Implantable Pulse Generator Implant Date: 20230709
Lead Channel Impedance Value: 1025 Ohm
Lead Channel Impedance Value: 410 Ohm
Lead Channel Impedance Value: 600 Ohm
Lead Channel Pacing Threshold Amplitude: 0.75 V
Lead Channel Pacing Threshold Amplitude: 1 V
Lead Channel Pacing Threshold Amplitude: 3 V
Lead Channel Pacing Threshold Pulse Width: 0.5 ms
Lead Channel Pacing Threshold Pulse Width: 0.5 ms
Lead Channel Pacing Threshold Pulse Width: 0.5 ms
Lead Channel Sensing Intrinsic Amplitude: 12 mV
Lead Channel Sensing Intrinsic Amplitude: 3.2 mV
Lead Channel Setting Pacing Amplitude: 1.75 V
Lead Channel Setting Pacing Amplitude: 3.5 V
Lead Channel Setting Pacing Amplitude: 3.5 V
Lead Channel Setting Pacing Pulse Width: 0.5 ms
Lead Channel Setting Pacing Pulse Width: 0.5 ms
Lead Channel Setting Sensing Sensitivity: 0.5 mV
Pulse Gen Serial Number: 810036011

## 2023-03-15 ENCOUNTER — Telehealth: Payer: Self-pay | Admitting: Cardiovascular Disease

## 2023-03-15 LAB — LAB REPORT - SCANNED: EGFR: 29

## 2023-03-15 NOTE — Telephone Encounter (Signed)
[place] with [facility] called to schedule an appointment next week for signs of CHF. She scheduled the patient for 7/22 and requests a nurse contact the patient. Patient was also added to the waitlist. She also states the patient had testing done and they will be sending the results once they receive them. Patient's sister was made aware of the appointment.

## 2023-03-15 NOTE — Telephone Encounter (Signed)
Spoke with patient's sister per DPR and she is not sure what signs of CHF Dr. Aniceto Boss noticed because she was not at the visit with patient. She states her brother said she wanted to give patient another fluid pill but wanted Dr. Landry Dyke recommendations. She also did labs today.  Left voicemail for Dr. Aniceto Boss to return call to office. Patient may need sooner appointment.

## 2023-03-18 ENCOUNTER — Telehealth: Payer: Self-pay

## 2023-03-18 NOTE — Telephone Encounter (Signed)
No sooner appt. Is available. Any recommendations prior to appointment?

## 2023-03-18 NOTE — Telephone Encounter (Signed)
Call to Sister to see if would like cancellation for today. She tries to call him and disconnects.  Cal her back and she wants to schedule in case she can make ti.  Advised that this can be used s an urgent appt so cannot put him in unless confirmed.  Advised I will call him as well. Two numbers are the same as her, the other number has no answer and no VM.   No response from Sister, appt was taken. Message sent to provider regarding  sooner appt. With cardiology

## 2023-03-18 NOTE — Telephone Encounter (Signed)
Call to Sister to see if would like cancellation for today. She tries to call him and disconnects.  Cal her back and she wants to schedule in case she can make ti.  Advised that this can be used s an urgent appt so cannot put him in unless confirmed.  Advised I will call him as well. Two numbers are the same as her, the other number has no answer and no VM.

## 2023-03-20 NOTE — Telephone Encounter (Signed)
Appointment for July 22nd

## 2023-03-21 LAB — LAB REPORT - SCANNED: EGFR: 29

## 2023-03-23 NOTE — Progress Notes (Unsigned)
Cardiology Clinic Note   Date: 03/25/2023 ID: YUTAKA HOLBERG, DOB [birth year], MRN [id number]  Primary Cardiologist:  Nicki Guadalajara, MD  Patient Profile    Bobby [name] is a 77 y.o. male who presents to the clinic today for evaluation of possible heart failure exacerbation at the request of PCP.     Past medical history significant for: CAD. LHC 08/05/2012 (STEMI): Proximal LAD 40 to 50%.  OM2 20 to 30%.  RCA 99% at the bifurcation of a marginal branch with evidence of dissection mid RCA.  PCI with DES 2.25 x 32 mm RCA. LHC 08/09/2017 (STEMI): Acute occlusion of the mid LAD at D2.  Diffusely diseased LAD with subtotal occlusion occurring and longer segment.  Mid LCx 99% and 80% OM1.  PCI with DES 2.5 x 22 mm to mid LAD. LHC 08/12/2017 (staged PCI): Previously placed stent proximal to mid LAD widely patent.  Mid Cx 99%.  OM1 85%.  PCI with DES to mid LCx and DES to OM1. Chronic systolic heart failure/complete heart block. BiV ICD implantation 03/11/2022. Remote device check 03/11/2023: Normal device function.  1 PMT detection.  Heart failure diagnostics abnormal x 9 days early April, abnormal x 16 days mid April, abnormal x 12 days late June/early July, currently normal.  Lead parameters and battery status stable.  Enroll in [facility] clinic for further monitoring. Echo 10/17/2022: EF 35 to 40%.  Apical aneurysm.  Mild LVH.  Grade I DD.  Normal RV function.  Moderate LAE. PVCs. Hypertension. Hyperlipidemia. Lipid panel 03/09/2022: LDL 66, HDL 39, TG 89, total 123. T2DM. COPD. OSA. CVA. Tobacco abuse. CKD stage IV.     History of Present Illness    CHRISTOPHR CALIX was first evaluated by cardiology on 08/04/2012 during hospital admission for chest pain with bilateral arm heaviness, and nausea/vomiting.  He was found to have STEMI and was taken emergently to the cardiac Cath Lab where he underwent PCI with DES to RCA.  On 08/09/2017 he was brought emergently to the Cath Lab for anterior STEMI and  underwent PCI with DES to mid LAD.  3 days later he had staged PCI with DES to mid LCx and DES to OM1.  Echo showed EF 30 to 35% (decreased from EF of 55 to 60% in October 2018).  He continues to be followed by Dr. Tresa Endo for the above outlined history.  Patient presented to the ER on 03/08/2022 with complaints of feeling swimmy headed with associated shortness of breath.  EMS found heart rate in the 40s with second-degree block.  He was evaluated by cardiology and was having brief.'s of complete heart block.  Initial plan was to treat conservatively however he continued to have symptomatic episodes of complete heart block so temporary pacemaker was placed until he underwent BiV ICD implantation.  Patient was last seen in the office by Dr. Tresa Endo on 10/26/2022 for routine follow-up.  He was doing well at that time and no medication changes were made.  Office was contacted on 03/15/2023 by [facility] with request for patient to be seen secondary to signs of CHF.  Today, patient is accompanied by his sister. He reports he feels at his baseline. Shortness of breath is unchanged from previous. He denies lower extremity edema or abdominal bloating/fullness. Patient's sister states he never has lower extremity edema. He does not weigh daily. His weight is up 7 lb from February. Patient and sister report they believe this has been of gradual onset. Patient reports  brisk diuresis on Lasix. He continues to smoke 1/2 pack a day.  No chest pain, pressure, or tightness. No palpitations.  Patient reports occasional lightheadedness he attributes to chronic cerumen impaction.     ROS: All other systems reviewed and are otherwise negative except as noted in History of Present Illness.  Studies Reviewed    EKG Interpretation Date/Time:  Monday March 25 2023 08:52:01 EDT Ventricular Rate:  60 PR Interval:  278 QRS Duration:  106 QT Interval:  400 QTC Calculation: 400 R Axis:   270  Text  Interpretation: AV dual-paced rhythm with prolonged AV conduction When compared with ECG of 12-Mar-2022 05:59, Vent. rate has decreased BY  27 BPM Confirmed by Carlos Levering [phone]) on 03/25/2023 8:59:38 AM        Physical Exam    VS:  BP 118/62 (BP Location: Left Arm, Patient Position: Sitting, Cuff Size: Normal)   Pulse 60   Ht 5\' 9"  (1.753 m)   Wt 261 lb (118.4 kg)   SpO2 90%   BMI 38.54 kg/m  , BMI Body mass index is 38.54 kg/m.  GEN: Well nourished, well developed, in no acute distress. Neck: No JVD or carotid bruits. Cardiac:  RRR. No murmurs. No rubs or gallops.   Respiratory:  Respirations regular and unlabored. Diminished breath sounds throughout bilaterally without rales, wheezing or rhonchi. GI: Soft, nontender, nondistended. Extremities: Radials/DP/PT 2+ and equal bilaterally. No clubbing or cyanosis. No edema   Skin: Warm and dry, no rash. Neuro: Strength intact.  Assessment & Plan    Chronic systolic heart failure/complete heart block.  S/p BiV ICD July 2023.  Remote device check 03/11/2023 was abnormal showing heart failure diagnostics throughout early mid April as well as late June/early July (details in patient profile).  Echo February 2024 showed EF 35 to 40%, normal RV function, mild LVH, Grade I DD, moderate LAE. Patient denies lower extremity edema or abdominal bloating/fullness. He has a protuberant abdomen that is soft to palpation. He reports brisk diuresis on Lasix. Euvolemic and well compensated on exam. Diminished breath sounds bilaterally without wheezing, rhonchi or rales. Continue isosorbide, hydralazine, carvedilol, Lasix. Dr. Elberta Fortis read last remote device check and wanted patient referred to [facility] clinic. It does not appear this was done so referral was placed today. Patient not a candidate for Entresto or spironolactone secondary to stage IV CKD. CAD.  S/p PCI with DES to RCA December 2013, DES to mid LAD December 2018, DES to mid LCx and DES to [employer]  December 2018. Patient denies chest pain, pressure or tightness. Continue aspirin, carvedilol, isosorbide, atorvastatin, as needed SL NTG. Hypertension: BP today 118/62. Patient denies headaches or vision changes. Patient reports occasional lightheadedness he attributes to chronic cerumen impaction. Continue amlodipine, carvedilol, hydralazine, isosorbide. Hyperlipidemia. LDL July 2023 66, at goal. Continue atorvastatin.  Tobacco abuse. Patient continues to smoke 1/2 pack a day. He is not interested in quitting at this time.   Disposition: Keep scheduled follow up with Dr. Tresa Endo 04/22/2023 or return sooner as needed. Patient will request latest labs from PCP.          Signed, Etta Grandchild. Braidan Ricciardi, DNP, NP-C

## 2023-03-25 ENCOUNTER — Encounter: Payer: Self-pay | Admitting: Student

## 2023-03-25 ENCOUNTER — Ambulatory Visit: Payer: 59 | Attending: Student | Admitting: Student

## 2023-03-25 VITALS — BP 118/62 | HR 60 | Ht 69.0 in | Wt 261.0 lb

## 2023-03-25 DIAGNOSIS — Z95810 Presence of automatic (implantable) cardiac defibrillator: Secondary | ICD-10-CM

## 2023-03-25 DIAGNOSIS — I2510 Atherosclerotic heart disease of native coronary artery without angina pectoris: Secondary | ICD-10-CM

## 2023-03-25 DIAGNOSIS — I442 Atrioventricular block, complete: Secondary | ICD-10-CM

## 2023-03-25 DIAGNOSIS — I5022 Chronic systolic (congestive) heart failure: Secondary | ICD-10-CM | POA: Diagnosis not present

## 2023-03-25 DIAGNOSIS — Z720 Tobacco use: Secondary | ICD-10-CM

## 2023-03-25 DIAGNOSIS — I10 Essential (primary) hypertension: Secondary | ICD-10-CM

## 2023-03-25 DIAGNOSIS — E785 Hyperlipidemia, unspecified: Secondary | ICD-10-CM

## 2023-03-25 NOTE — Patient Instructions (Addendum)
Medication Instructions:  Your physician recommends that you continue on your current medications as directed. Please refer to the Current Medication list given to you today.  *If you need a refill on your cardiac medications before your next appointment, please call your pharmacy*   Lab Work: none If you have labs (blood work) drawn today and your tests are completely normal, you will receive your results only by: MyChart Message (if you have MyChart) OR A paper copy in the mail If you have any lab test that is abnormal or we need to change your treatment, we will call you to review the results.   Testing/Procedures: none   Follow-Up: At [facility], you and your health needs are our priority.  As part of our continuing mission to provide you with exceptional heart care, we have created designated Provider Care Teams.  These Care Teams include your primary Cardiologist (physician) and Advanced Practice Providers (APPs -  Physician Assistants and Nurse Practitioners) who all work together to provide you with the care you need, when you need it.  We recommend signing up for the patient portal called "MyChart".  Sign up information is provided on this After Visit Summary.  MyChart is used to connect with patients for Virtual Visits (Telemedicine).  Patients are able to view lab/test results, encounter notes, upcoming appointments, etc.  Non-urgent messages can be sent to your provider as well.   To learn more about what you can do with MyChart, go to [email or web address].    Your next appointment:    Keep scheduled appointment 04/22/2023 11am  Provider:   Nicki Guadalajara, MD     Weigh daily. Call the office for weight gain of 3 pounds in one day or 5 pounds in one week

## 2023-03-26 NOTE — Progress Notes (Signed)
Remote ICD transmission.   

## 2023-03-27 ENCOUNTER — Encounter: Payer: Self-pay | Admitting: Family Medicine

## 2023-04-22 ENCOUNTER — Ambulatory Visit: Payer: 59 | Attending: Cardiovascular Disease | Admitting: Cardiovascular Disease

## 2023-04-22 ENCOUNTER — Other Ambulatory Visit (HOSPITAL_COMMUNITY): Payer: Self-pay | Admitting: Family Medicine

## 2023-04-22 ENCOUNTER — Encounter: Payer: Self-pay | Admitting: Cardiovascular Disease

## 2023-04-22 VITALS — BP 128/64 | HR 58 | Ht 69.0 in | Wt 255.0 lb

## 2023-04-22 DIAGNOSIS — I255 Ischemic cardiomyopathy: Secondary | ICD-10-CM

## 2023-04-22 DIAGNOSIS — I442 Atrioventricular block, complete: Secondary | ICD-10-CM

## 2023-04-22 DIAGNOSIS — I5022 Chronic systolic (congestive) heart failure: Secondary | ICD-10-CM

## 2023-04-22 DIAGNOSIS — Z720 Tobacco use: Secondary | ICD-10-CM

## 2023-04-22 DIAGNOSIS — N289 Disorder of kidney and ureter, unspecified: Secondary | ICD-10-CM

## 2023-04-22 DIAGNOSIS — I10 Essential (primary) hypertension: Secondary | ICD-10-CM

## 2023-04-22 DIAGNOSIS — I2510 Atherosclerotic heart disease of native coronary artery without angina pectoris: Secondary | ICD-10-CM | POA: Diagnosis not present

## 2023-04-22 DIAGNOSIS — N184 Chronic kidney disease, stage 4 (severe): Secondary | ICD-10-CM

## 2023-04-22 DIAGNOSIS — G4733 Obstructive sleep apnea (adult) (pediatric): Secondary | ICD-10-CM

## 2023-04-22 DIAGNOSIS — Z7901 Long term (current) use of anticoagulants: Secondary | ICD-10-CM

## 2023-04-22 DIAGNOSIS — Z95810 Presence of automatic (implantable) cardiac defibrillator: Secondary | ICD-10-CM | POA: Diagnosis not present

## 2023-04-22 NOTE — Patient Instructions (Signed)
Medication Instructions:  *If you need a refill on your cardiac medications before your next appointment, please call your pharmacy*   Lab Work: If you have labs (blood work) drawn today and your tests are completely normal, you will receive your results only by: MyChart Message (if you have MyChart) OR A paper copy in the mail If you have any lab test that is abnormal or we need to change your treatment, we will call you to review the results.   Follow-Up: At [facility], you and your health needs are our priority.  As part of our continuing mission to provide you with exceptional heart care, we have created designated Provider Care Teams.  These Care Teams include your primary Cardiologist (physician) and Advanced Practice Providers (APPs -  Physician Assistants and Nurse Practitioners) who all work together to provide you with the care you need, when you need it.  We recommend signing up for the patient portal called "MyChart".  Sign up information is provided on this After Visit Summary.  MyChart is used to connect with patients for Virtual Visits (Telemedicine).  Patients are able to view lab/test results, encounter notes, upcoming appointments, etc.  Non-urgent messages can be sent to your provider as well.   To learn more about what you can do with MyChart, go to [email or web address].    Your next appointment:   6 month(s)  Provider:   Nicki Guadalajara, MD     A letter will be mailed to you as a reminder to call the office for your 6 month follow up.

## 2023-04-22 NOTE — Progress Notes (Signed)
I patient ID: CESAREO SOTOLONGO, male   DOB: 1946-08-02, 77 y.o.   MRN: [id number]       HPI: ABB TWIGG is a 77 y.o. male presents to the office today for a 6 month followup evaluation.   Mr. Juby  has a long-standing tobacco history having started smoking at age 62, a history of hypertension, remote small TIA/CVA who presented to the hospital on 08/04/2012 in the setting of inferior ST segment elevation myocardial infarction. Catheterization by me revealed a 99% stenosis of the RCA and concomitant CAD involving his LAD and circumflex vessels. He underwent acute percutaneous coronary intervention with an excellent door to balloon time of only 20 minutes and ultimately insertion of a 3.25x32 mm DES stent post dilated 3.3. An echo done in the hospital showed an EF of 50-60% with mild basal inferior probable scar and moderate pulmonary hypertension with PA pressure of 45 mm.  When I saw Mr. Bingaman in May 2014, he did note shortness of breath with activity. He denied definitive chest pain. At that time, he realized that he had inadvertentlystopped taking the atorvastatin 40 mg dose and this was resumed. In addition, his Toprol dose was increased from 50 to 75 mg daily. A nuclear study showed an ejection fraction of 66%. Perfusion was essentially normal with exception of a small region of fixed inferoseptal bowel artifact and possible small area of inferobasilar scar.   Mr. Treadwell unfortunately continues to smoke at least a pack of cigarettes per day.   He does experience shortness of breath with activity.  He denies recent chest pain.  He has chronic right bundle branch block with repolarization changes.  He is diabetic and has pulmonary hypertension.  A follow-up nuclear perfusion study on 03/26/2014 remianed low risk and showed normal LV function and normal wall motion.  There was a fixed inferior defect that was felt most consistent with diaphragmatic attenuation.  There was no evidence for ischemia.  He  underwent prostate surgery by Dr. Patsi Sears without cardiovascular compromise.    A screening abdominal aortic ultrasound revealed a normal abdominal aorta.  Since I saw him in November 2017, he had an extensive history and has had recurrent hospitalizations he was hospitalized in October 2018 for left arm pain.  Troponin was negative.  In December 2018 he suffered an anterior ST segment elevation MI due to acute occlusion of his mid LAD and underwent successful PCI of his LAD with insertion of a 2.5 x 22 mm Resolute Onyx DES stent.  He was also found to have 99% dominant mid left circumflex lesion with 80% stenosis in the OM1 vessel and underwent staged intervention to the mid circumflex and marginal vessels.  Troponin was greater than 65.  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  There was mention of sludge is a sign of pre-thrombotic state but no definite thrombus was seen at the apex.  He returned to the hospital August 20, 2017 with malaise and dyspnea.  He was bradycardic.  He was diuresed.  He has renal insufficiency with creatinines increasing up to 1.9.  He has been followed by Brett Fairy on numerous occasions in the office.  Unfortunately continues to smoke cigarettes.  He admits to 1/2 pack/day.  He does note occasional dizziness as well as some shortness of breath.  He denies recurrent anginal type symptoms.   Suggestive of low flow state.  When I  saw him on Jan 07, 2018  I recommended that he undergo a follow-up echo Doppler study to reassess systolic and diastolic function as well as potential for apical thrombus formation.  He had continued to be on aspirin and Brilinta following his STEMI.  He was on atorvastatin 80 mg for hyperlipidemia with target less than 70.  I had a long discussion regarding smoking cessation.  He underwent a follow-up echo Doppler study on Jan 20, 2018.  This showed reduced LV function with an EF  of 35 to 40% with akinesis of the mid apical anterolateral and septal as well as apical walls.  There was grade 1 diastolic dysfunction.  There was now a possible small layered thrombus at the apex noted using Definity and also evidence of swirling at the LV apex suggestive of low flow state.   As result of the echo findings, I saw him in follow-up on Jan 24, 2018 at which time I had a very long discussion with him guarding potential thromboembolic stroke risk.  He was started on warfarin anticoagulation.  Apparently, the patient's medications have been in a  pack and had been followed by his girlfriend.  He was taking his medications correctly resulting in significant over anticoagulation with INR reached greater than 10.  He has been followed very closely by our pharmacy department and he also was given vitamin K.  Due to his poor medication compliance after much discussion ultimately the thought is for him to transition to Eliquis once his INR gets below 2.  He also has been taking his blood pressure medicines incorrectly.  Patient has been sleeping most of the day.  In the past I had recommended he undergo a sleep evaluation for sleep apnea which he never followed up with it was a no-show in the sleep lab.  He was worked into my schedule on February 21, 2018 and was seen as an add-on due to his medication issues.  He was here now with his sister who in the past had done an excellent job in caring for his medications and she is committed now to resume doing this for him.  During his evaluation, he was exceptionally somnolent, and a significant time was spent with medication adjustment.  Due to his inability to take effect of warfarin the decision was made to use Eliquis for anticoagulation with more consistent dosing.  His recent laboratory had shown stage IV chronic kidney disease and it was recommended that he hold his lisinopril for several days and then reduce the dose to 5 mg as well as holding his  furosemide with subsequent reduction of dose with reinstitution at 20 mg.  I scheduled him for an expeditious sleep study due to concerns for significant sleep apnea and he was seen5 days later by Racquel with medication adjustment and anticoagulation issues.  He underwent a sleep study on February 25, 2018 and he was found to have moderate overall sleep apnea with an AHI of 22.2 and RDI of 25.4.  He could not achieve any rem sleep and the overall severity may very well be underestimated.  He was titrated up to 10 cm water pressure.  An initial trial of CPAP auto with an EPR range of 8-15 was recommended.  When I last saw him on March 19, 2018 he had he had just initiated CPAP the evening before noted significant improvement in his sleep. His renal function had significantly improved from a creatinine of 2.7 down to 1.362 weeks ago.  He  is unaware of any recurrent episodes of arrhythmia.  Apparently his blood pressure had become elevated and Dr. Hal Hope added losartan to his medical regimen.  Of note, he also already was on lisinopril 2.5 mg, furosemide 20 mg, carvedilol 6.25 mg twice a day.  He has been only intermittently using CPAP due to complaints of the mask on his nose bridge.  As result, he was recently changed to a different mask.  A download was obtained in the office today in which he does not meet compliance.  [employer] is his DME company.  His set up date was March 17, 2018 and he has until June 17, 2018 to demonstrate compliance.    When I saw him in September 2019 his blood pressure was elevated.  At that time apparently he was on both the previous lisinopril and the recent losartan that was started by Dr. Hal Hope.  I suggested he discontinue lisinopril and further titrated losartan to 25 mg twice a day both for improved blood pressure and for his reduced LV function.  He was not compliant with reference to his CPAP therapy and I had a long discussion with him regarding the importance that he meet  compliance by October 15.  I saw him in October 2019.  At that time he was not using his CPAP therapy.  I reviewed his blood pressure recordings and they seem to be consistently elevated.  He continues to be on Eliquis and was taking Lasix 20 mg.  That evaluation I had a long discussion with him guarding his noncompliance with CPAP.  I discussed alternatives such as customized oral appliance.  We discussed with his LV dysfunction he may be a candidate for Jardiance if his renal function is stable.  Was maintaining sinus rhythm.  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  His ECG was unchanged.  Troponins were negative.  He was discharged from the ER and seen by Azalee Course, [facility] on October 01, 2018.  He had not had any further chest pain since he left the hospital and no ischemic work-up was planned.  His renal function had worsened and his furosemide was changed to every other day.  Visibly he feels well and has not had recurrent chest pain.  He uses oxygen at 2 L nasal cannula at nighttime instead of his CPAP therapy.  Laboratory 2 days prior to his office visit has shown a creatinine of 2.07 with a BUN of 37.  Potassium was 5.4.   I saw him in February 2020.  At that time his blood pressure was stable and he was taken furosemide 20 mg every other day, carvedilol 18.75 mg twice a day, and valsartan 40 mg.  Renal function improved with discontinuance of furosemide.  I saw him in November 2020 when he came to the office with his sister. He was on supplemental oxygen at nighttime but has not been using CPAP.  Upon further questioning he had difficulty with the mask on the bridge of his nose.  Recently, his blood pressure has been elevated and he has been of seen by Dr. Hal Hope.  He apparently was now on losartan 50 mg in place of valsartan, carvedilol 18.75 mg twice a day,  and he is on Plavix in addition to apixaban 5 mg twice a day.  He continued to be on atorvastatin 80  mg daily for hyperlipidemia.  He is diabetic on metformin.  He is on bupropion 300 mg for depression.  During that evaluation, his blood pressure was elevated at 162/90 I recommended further titration of carvedilol to 25 mg twice a day and added amlodipine 5 mg.  I again discussed the importance of using CPAP therapy.  He had stage III chronic kidney disease with creatinine improving from 2.07 to 1.77.  I saw him in May 2021 at which time any chest pain.He was treated with an infection by Dr. Hal Hope and at times has had some recurrent congestion.  He is breathing better.  He uses supplemental oxygen but admits that at times he does not use it every night.  Unfortunately he still smoking a pack of cigarettes lasting 2 to 2-1/2 days.  Laboratory on November 05, 2019 showed a creatinine at 2.25.  He has recently been on [employer] in addition to albuterol for wheezing.  However he had was not consistently using his combination drug treatment.    I saw him in May 02, 2020.  Over the prior months he continued to be stable from a cardiac standpoint.   He underwent a follow-up echo Doppler study on February 15, 2020 which showed an EF of 35 to 40%.  There was mild LVH of the basal septal segment.  There was grade 1 diastolic dysfunction.  He had previously noted akinesis of the apex and basal inferior wall and mid apical anteroseptal wall.  There was no evidence for residual thrombus.  Last week he under went right neck melanoma resection by Dr. Roderic Scarce.  He has further decreased his tobacco use.    He was evaluated by Corine Shelter in March 2022.  I last saw him on May 11, 2021 at which time he remained relatively stable but admitted to rare episodes of chest discomfort occurring approximately every 1 to 1-1/2 months.  Unfortunately he continues to smoke cigarettes.  He believes he is sleeping adequately.  His  primary physician, Dr. Hal Hope had checked laboratory July 08, 2020.  Creatinine was increased at 1.94.  He is unaware of palpitations, presyncope or syncope.  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  He had anteroseptal Q waves consistent with his prior MI.  I recommended follow-up laboratory and a follow-up echo Doppler assessment.  He was evaluated by pulmonary on June 15, 2019.[name]  He has COPD and dyspnea which is multifactorial as result of his combined systolic and diastolic heart failure, COPD, CAD, and cardiac deconditioning.  I last saw him on July 17, 2021.  His echo Doppler from June 05, 2021 showed an EF of 30 to 35% with previously noted septal apical akinesis and inferior wall hypokinesis, mild mitral regurgitation, and mild to moderate aortic valve sclerosis without stenosis.  He has continued to be on Eliquis for anticoagulation.  Laboratory had shown elevation of potassium  potassium at 5.5 and creatinine increased to 2.36.  He was advised to hold losartan as well as discontinue spironolactone.    I saw him on September 05, 2021.  At that time he was experiencing occasional shortness of breath.  He was evaluated by Janeann Forehand, NP pulmonary on August 22, 2021 as part of the lung cancer screening.  Chest CT on December 21 showed tiny pulmonary nodules felt to be long RADS 2 benign appearance.  He also was noted to have coronary atherosclerosis.  He was  recently evaluated at [facility] ER on January 1, diagnosed with pneumonia and discharged with doxycycline.  He denies any chest tightness or pressure.  He has not been using CPAP therapy.  Follow-up laboratory was recommended and a more recent creatinine had improved to 1.64  He was hospitalized from January 5 through September 11, 2021 with hypoxemia, initially required BiPAP but he became unresponsive and was ultimately intubated.  He was  felt to have septic shock and required pressor therapy.  He ultimately stabilized and remained on antibiotic therapy with ceftriaxone and azithromycin in addition to prednisone.  I last saw him on Jan 09, 2022.  Since his hospitalization, he has felt well.  He sees his primary physician Dr. Nadyne Coombes regularly.  He recently was started on furosemide 40 mg by Dr. Nolene Ebbs.  He continues to smoke.  He denies chest pain.  He does not use CPAP.  He continued to be on low-dose amlodipine 5 mg, carvedilol 25 mg twice a day and low-dose hydralazine 10 mg twice a day.  He was on low-dose prednisone at 10 mg and on atorvastatin 80 mg.  Since I saw him, he presented to the hospital on March 09, 2022 after an episode of syncope.  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  Due to his history of heart failure he underwent insertion of an Abbott CRT-D device on March 11, 2022 by Dr. Elberta Fortis.  He has subsequently seen Dr. Elberta Fortis on June 13, 2022 in the office and was remaining stable.  ECG showed sinus rhythm V paced at a rate of 60.  His device was functioning appropriately.  I saw him on July 18, 2022.  At that time Mr. Steinberger denied any chest pain or significant shortness of breath.  He no longer uses CPAP therapy for his obstructive sleep apnea.  He is breathing much better.  Unfortunately he is still smoking with a pack lasting 2 to 3 days.  He is now on a regimen of amlodipine 5 mg, carvedilol 25 mg twice a day, hydralazine 50 mg every 8 hours, furosemide 40 mg daily isosorbide 30 mg daily and is on atorvastatin 80 mg for hyperlipidemia.  He is anticoagulated on Eliquis 5 mg twice a day.  He is on Trelegy Ellipta and albuterol for COPD and continues to be on low-dose prednisone 10 mg daily.   At his last office visit, I recommended Mr. Fails have a follow-up 2D echo Doppler study.  His echo was done on October 17, 2022.  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  There was mild LVH.  There is moderate LA dilation.  There was no evidence for apical thrombus.  I last saw him on October 26, 2022 at which time Mr. Estis felt well.  He continues to smoke half pack of cigarettes per day.  He underwent right eye cataract surgery yesterday by Dr. Burgess Estelle and is scheduled to undergo left eye cataract surgery in March.  He underwent laboratory in July 18, 2022 which showed stage IV CKD with BUN 66 creatinine 2.43. Lipid studies in July 2023 showed total cholesterol 123, triglycerides 89, HDL 39 LDL 66.  He denies any chest pain.  He denies any awareness of arrhythmia.  There is no recent bleeding.  He was no longer using CPAP therapy.  He has had eyes checked monitoring by Dr. Elberta Fortis, last in April 2024 showing battery and lead parameters are stable.  Since I last saw him, he was recently seen by Servando Snare, NP on March 25, 2023.  He has CKD with most recent creatinine at 2.76 on Jan 11, 2023.  He is on hydralazine 50 mg every 8 hours isosorbide mononitrate 30 mg daily, carvedilol 25 mg twice a day, amlodipine 5 mg daily for his cardiomyopathy.  His last echo Doppler study on October 17, 2022 showed moderate LV dysfunction with EF 35 to 40% with prior apical aneurysm.  He has mild LVH and moderate LA dilation.  He denies recent chest pain.  He continues to smoke half pack per day.  He no longer uses CPAP.  He presents for evaluation.  Past Medical History:  Diagnosis Date   DM (diabetes mellitus), type 2 new diagnosis 08/05/2012   diet control   ED (erectile dysfunction)    surgery planned   Hyperlipidemia 08/05/2012   Hypertensive heart disease    Panic attack    Pulmonary HTN (HCC)    echo 08/05/12, EF 55-60%, PA pressure 45mm   S/P coronary artery stent placement, to RCA Promus DES 08/05/2012   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   Tobacco abuse     Past Surgical History:  Procedure Laterality Date   BIV ICD INSERTION  CRT-D N/A 03/11/2022   Procedure: BIV ICD INSERTION CRT-D;  Surgeon: Regan Lemming, MD;  Location: [facility] INVASIVE CV LAB;  Service: Cardiovascular;  Laterality: N/A;   CARDIAC CATHETERIZATION  08/04/12   PCI to RCA with DES   CORONARY STENT INTERVENTION N/A 08/12/2017   Procedure: CORONARY STENT INTERVENTION;  Surgeon: Swazil[place], Peter M, MD;  Location: [facility] INVASIVE CV LAB;  Service: Cardiovascular;  Laterality: N/A;   CORONARY/GRAFT ACUTE MI REVASCULARIZATION N/A 08/09/2017   Procedure: Coronary/Graft Acute MI Revascularization;  Surgeon: Lyn Records, MD;  Location: [facility] INVASIVE CV LAB;  Service: Cardiovascular;  Laterality: N/A;   LEFT HEART CATH AND CORONARY ANGIOGRAPHY N/A 08/09/2017   Procedure: LEFT HEART CATH AND CORONARY ANGIOGRAPHY;  Surgeon: Lyn Records, MD;  Location: [facility] INVASIVE CV LAB;  Service: Cardiovascular;  Laterality: N/A;   LEFT HEART CATHETERIZATION WITH CORONARY ANGIOGRAM N/A 08/04/2012   Procedure: LEFT HEART CATHETERIZATION WITH CORONARY ANGIOGRAM;  Surgeon: Lennette Bihari, MD;  Location: [facility] CATH LAB;  Service: Cardiovascular;  Laterality: N/A;   LEG SURGERY Left    rod placed for fracture repair   PENILE PROSTHESIS IMPLANT N/A 06/03/2014   Procedure: IMPLANT PENILE PROTHESIS INFLATABLE;  Surgeon: Kathi Ludwig, MD;  Location: [facility] ORS;  Service: Urology;  Laterality: N/A;  with penile block--0.5% marcaine plain   PERCUTANEOUS CORONARY STENT INTERVENTION (PCI-S) Right 08/04/2012   Procedure: PERCUTANEOUS CORONARY STENT INTERVENTION (PCI-S);  Surgeon: Lennette Bihari, MD;  Location: [facility] CATH LAB;  Service: Cardiovascular;  Laterality: Right;   TEMPORARY PACEMAKER N/A 03/08/2022   Procedure: TEMPORARY PACEMAKER;  Surgeon: Lyn Records, MD;  Location: [facility] INVASIVE CV LAB;  Service: Cardiovascular;  Laterality: N/A;   THUMB ARTHROSCOPY Left    thumb joint replaced    Allergies  Allergen Reactions   Hydrocodone Itching    Current Outpatient Medications  Medication Sig  Dispense Refill   acetaminophen (TYLENOL) 325 MG tablet Take 2 tablets (650 mg total) by mouth every 4 (four) hours as needed for headache or mild pain.     albuterol (PROVENTIL HFA;VENTOLIN HFA) 108 (90 Base) MCG/ACT inhaler Inhale 2 puffs into the lungs every 6 (six) hours as needed for wheezing or  shortness of breath. 1 Inhaler 2   amLODipine (NORVASC) 5 MG tablet TAKE ONE TABLET BY MOUTH DAILY 180 tablet 3   apixaban (ELIQUIS) 5 MG TABS tablet Take 1 tablet (5 mg total) by mouth 2 (two) times daily. 28 tablet 1   aspirin EC 81 MG tablet Take 1 tablet (81 mg total) by mouth daily. Swallow whole. 90 tablet 3   atorvastatin (LIPITOR) 80 MG tablet Take 1 tablet (80 mg total) by mouth daily at 6 PM. 90 tablet 3   BREZTRI AEROSPHERE 160-9-4.8 MCG/ACT AERO Inhale 2 puffs into the lungs 2 (two) times daily.     buPROPion (WELLBUTRIN XL) 300 MG 24 hr tablet Take 300 mg by mouth daily.     carvedilol (COREG) 25 MG tablet TAKE ONE TABLET BY MOUTH TWICE DAILY 180 tablet 3   clonazePAM (KLONOPIN) 1 MG tablet Take 1 mg by mouth 2 (two) times daily as needed for anxiety.     Coenzyme Q10 (COQ10 PO) Take 1 capsule by mouth daily.     COLCRYS 0.6 MG tablet Take 0.6 mg by mouth daily.  3   ferrous sulfate 325 (65 FE) MG EC tablet Take 325 mg by mouth 2 (two) times a week.     furosemide (LASIX) 40 MG tablet Take 40 mg by mouth daily.     hydrALAZINE (APRESOLINE) 50 MG tablet Take 1 tablet (50 mg total) by mouth every 8 (eight) hours. 90 tablet 6   isosorbide mononitrate (IMDUR) 30 MG 24 hr tablet Take 1 tablet (30 mg total) by mouth daily. 30 tablet 6   nitroGLYCERIN (NITROSTAT) 0.4 MG SL tablet DISSOLVE 1 TABLET UNDER THE TONGUE EVERY 5 MINUTES AS NEEDED FOR CHEST PAIN. DO NOT EXCEED A TOTAL OF 3 DOSES IN 15 MINUTES. 90 tablet 3   Omega-3 Fatty Acids (FISH OIL PO) Take 1 tablet by mouth daily.     oxyCODONE-acetaminophen (PERCOCET) 10-325 MG per tablet Take 1 tablet by mouth every 6 (six) hours as needed for  pain.     predniSONE (DELTASONE) 10 MG tablet Take 10 mg by mouth daily.     sertraline (ZOLOFT) 100 MG tablet Take 100 mg by mouth daily.     TRELEGY ELLIPTA 200-62.5-25 MCG/ACT AEPB Inhale 1 puff into the lungs daily.     Vitamin D, Ergocalciferol, (DRISDOL) 1.25 MG (50000 UNIT) CAPS capsule Take 50,000 Units by mouth 2 (two) times a week.     No current facility-administered medications for this visit.    Socially,  he is divorced. He doesn't walk but not routinely exercise.  He continues to smoke one pack of cigarettes per day.  ROS General: Negative; No fevers, chills, or night sweats;  HEENT: Negative; No changes in vision or hearing, sinus congestion, difficulty swallowing Pulmonary:  Positive for shortness of breath; COPD Cardiovascular: See history of present illness GI: Negative; No nausea, vomiting, diarrhea, or abdominal pain GU: Negative; No dysuria, hematuria, or difficulty voiding Musculoskeletal: Negative; no myalgias, joint pain, or weakness Hematologic/Oncology: Negative; no easy bruising, bleeding Endocrine: Positive for diabetes no heat/cold intolerance;  Neuro: Negative; no changes in balance, headaches Skin: Negative; No rashes or skin lesions Psychiatric: Negative; No behavioral problems, depression Sleep: obstructive sleep apnea, CPAP initiated March 17, 2018; currently not on therapy. no bruxism, restless legs, hypnogognic hallucinations, no cataplexy Other comprehensive 14 point system review is negative.   PE BP 128/64 (BP Location: Left Arm, Patient Position: Sitting, Cuff Size: Normal)   Pulse (!) 58  Ht 5\' 9"  (1.753 m)   Wt 255 lb (115.7 kg)   SpO2 94%   BMI 37.66 kg/m    Repeat blood pressure by me was 110/58  Wt Readings from Last 3 Encounters:  04/22/23 255 lb (115.7 kg)  03/25/23 261 lb (118.4 kg)  10/26/22 254 lb 6.4 oz (115.4 kg)     Physical Exam BP 128/64 (BP Location: Left Arm, Patient Position: Sitting, Cuff Size: Normal)   Pulse  (!) 58   Ht 5\' 9"  (1.753 m)   Wt 255 lb (115.7 kg)   SpO2 94%   BMI 37.66 kg/m  General: Alert, oriented, no distress.  Skin: normal turgor, no rashes, warm and dry HEENT: Normocephalic, atraumatic. Pupils equal round and reactive to light; sclera anicteric; extraocular muscles intact; Fundi ** Nose without nasal septal hypertrophy Mouth/Parynx benign; Mallinpatti scale Neck: No JVD, no carotid bruits; normal carotid upstroke Lungs: clear to ausculatation and percussion; no wheezing or rales Chest wall: without tenderness to palpitation Heart: PMI not displaced, RRR, s1 s2 normal, 1/6 systolic murmur, no diastolic murmur, no rubs, gallops, thrills, or heaves Abdomen: soft, nontender; no hepatosplenomehaly, BS+; abdominal aorta nontender and not dilated by palpation. Back: no CVA tenderness Pulses 2+ Musculoskeletal: full range of motion, normal strength, no joint deformities Extremities: no clubbing cyanosis or edema, Homan's sign negative  Neurologic: grossly nonfocal; Cranial nerves grossly wnl Psychologic: Normal mood and affect    General: Alert, oriented, no distress.  Skin: normal turgor, no rashes, warm and dry HEENT: Normocephalic, atraumatic. Pupils equal round and reactive to light; sclera anicteric; extraocular muscles intact;  Nose without nasal septal hypertrophy Mouth/Parynx benign; Mallinpatti scale 3 Neck: No JVD, no carotid bruits; normal carotid upstroke Lungs: clear to ausculatation and percussion; no wheezing or rales Chest wall: without tenderness to palpitation Heart: PMI not displaced, RRR, s1 s2 normal, 1/6 systolic murmur, no diastolic murmur, no rubs, gallops, thrills, or heaves Abdomen: soft, nontender; no hepatosplenomehaly, BS+; abdominal aorta nontender and not dilated by palpation. Back: no CVA tenderness Pulses 2+ Musculoskeletal: full range of motion, normal strength, no joint deformities Extremities: no clubbing cyanosis or edema, Homan's sign  negative  Neurologic: grossly nonfocal; Cranial nerves grossly wnl Psychologic: Normal mood and affect  EKG Interpretation Date/Time:  Monday April 22 2023 11:10:47 EDT Ventricular Rate:  60 PR Interval:  218 QRS Duration:  164 QT Interval:  456 QTC Calculation: 456 R Axis:   270  Text Interpretation: AV dual-paced rhythm with prolonged AV conduction When compared with ECG of 25-Mar-2023 08:52, No significant change was found Confirmed by Nicki Guadalajara ([id number]) on 04/22/2023 11:30:45 AM    October 26, 2022  ECG (independently read by me): AV paced at 60; PR 226 msec  July 18, 2022 ECG (independently read by me): A paced and sensed, V paced at 61, Anterior Q waves  May 9, 2023ECG (independently read by me):  Sinus bradycardia at 53, LAD, RBBB, Q waves inferiorly and anteriorly    September 05, 2021 ECG (independently read by me):  NSR at 84, LAD, RBBB, LVH Q waves V3-6, II, aVF  July 17, 2021 ECG (independently read by me):  NSR at 62, LAD, RBBB, old inferior and anterior infarct  May 11, 2021 ECG (independently read by me): Normal sinus rhythm at 76 bpm, right bundle branch block, left anterior hemiblock.  No ectopy.  Anteroseptal Q waves consistent with prior anterior MI  May 02, 2020 ECG (independently read by me): Normal sinus rhythm at 69  bpm, bifascicular block with right bundle branch block and left anterior hemiblock.  On anteroseptal Q waves and inferior Q waves consistent with prior MIs.  Mild T wave abnormality laterally.  QTc interval 432 ms.     May 21, 2021ECG (independently read by me): Sinus bradycarrdia at 57; RBBB, Q V1-3 ands avF  July 24, 2019 ECG (independently read by me): Normal sinus rhythm at 80 bpm.  Right bundle branch block; previously noted Q waves V1 through V4 and 2 3 and F consistent with prior infarct  October 15, 2018 ECG (independently read by me): Normal sinus rhythm at 93 bpm with baseline artifact.  Right bundle branch block.   Anterior Q waves consistent with prior infarct.  Inferior Q waves.  ECG (independently read by me): Sinus rhythm at 65 bpm.  PAC.  Right bundle branch block, inferior Q waves in 3 and aVF, old anterior myocardial infarction.  Lateral T wave abnormality.  Normal intervals.  June 16, 2018 ECG (independently read by me): Normal sinus rhythm at 84 bpm.  Right bundle branch block with repolarization changes.  Inferior Q waves consistent with prior infarct.  Anteroseptal Q waves consistent with prior anterior MI.  May 28, 2018 ECG (independently read by me): Normal sinus rhythm with sinus arrhythmia.  Right bundle branch block with repolarization changes.  Q waves consistent with anteroseptal MI and possible inferior MI no indication to adjust the pain upon his needs  July 17,2019 ECG (independently read by me): Normal sinus rhythm 89 bpm.  Right bundle branch block with repolarization changes.  Inferior Q waves and anterior  Q waves consistent with anterolateral infarct with possible inferior infarct  February 21, 2018 ECG (independently read by me): Normal sinus rhythm at 95 bpm, right bundle branch block with repolarization changes.  Anterolateral MI.  QTc interval 477 ms  Jan 24, 2018 ECG (independently read by me): Sinus rhythm at 97 bpm.  Right bundle branch block with repolarization changes.  QTc interval 472 ms.  Anterolateral Q waves  Jan 07, 2018 ECG (independently read by me): Normal sinus rhythm at 88 bpm.  Right bundle branch block with repolarization changes.  Inferior Q waves.  T wave anteroseptally, possible anteroseptal MI undetermined.  November 2017 ECG (independently read by me): Normal sinus rhythm at 64 bpm.  Branch block with repolarization changes.  QTc interval 460 ms.  June 2016 ECG (independently read by me): Sinus bradycardia at 57 bpm, right bundle branch block with repositioning changes.  QTc interval 459 ms.  September 2015 ECG (independently read by me and (: Normal  sinus rhythm.  Right bundle branch block with repolarization changes.  Ventricular rate 81.  02/25/2014 ECG Normal sinus rhythm.  Right bundle branch block with repolarization changes.  PR interval 148 ms; QTc interval 463 ms  Prior ECG: sinus rhythm at 76 beats per minute; right bundle-branch block with repolarization changes. PR interval 142 ms, QTC 481 ms.   LABS:     Latest Ref Rng & Units 01/11/2023    1:32 PM 07/18/2022   12:36 PM 03/12/2022    1:24 AM  BMP  Glucose 70 - 99 mg/dL 098  119  147   BUN 8 - 27 mg/dL 55  66  31   Creatinine 0.76 - 1.27 mg/dL 8.29  5.62  1.30   BUN/Creat Ratio 10 - 24 20  27     Sodium 134 - 144 mmol/L 140  141  140   Potassium 3.5 - 5.2 mmol/L  4.8  4.5  4.2   Chloride 96 - 106 mmol/L 104  104  108   CO2 20 - 29 mmol/L 21  22  24    Calcium 8.6 - 10.2 mg/dL 9.5  16.1  9.5       Latest Ref Rng & Units 01/11/2023    1:32 PM 07/18/2022   12:36 PM 03/08/2022    1:05 PM  Hepatic Function  Total Protein 6.0 - 8.5 g/dL 6.5  6.7  6.2   Albumin 3.8 - 4.8 g/dL 4.4  4.5  3.6   AST 0 - 40 IU/L 25  22  20    ALT 0 - 44 IU/L 31  28  31    Alk Phosphatase 44 - 121 IU/L 141  113  89   Total Bilirubin 0.0 - 1.2 mg/dL 0.2  0.2  0.7       Latest Ref Rng & Units 03/12/2022    1:24 AM 03/11/2022   12:30 AM 03/09/2022    2:52 AM  CBC  WBC 4.0 - 10.5 K/uL 10.8  9.9  10.1   Hemoglobin 13.0 - 17.0 g/dL 09.6  04.5  40.9   Hematocrit 39.0 - 52.0 % 36.7  37.7  38.8   Platelets 150 - 400 K/uL 136  140  152    Lab Results  Component Value Date   MCV 87.8 03/12/2022   MCV 90.2 03/11/2022   MCV 89.0 03/09/2022   Lab Results  Component Value Date   TSH 1.130 03/08/2022   Lipid Panel     Component Value Date/Time   CHOL 123 03/09/2022 0252   CHOL 141 05/30/2021 1050   TRIG 89 03/09/2022 0252   HDL 39 (L) 03/09/2022 0252   HDL 40 05/30/2021 1050   CHOLHDL 3.2 03/09/2022 0252   VLDL 18 03/09/2022 0252   LDLCALC 66 03/09/2022 0252   LDLCALC 77 05/30/2021 1050    INR result since initiation of Coumadin: 1:> 7.6;> >10;> 8.6 (Vit K administered) >4.3; no longer on Coumadin, now on Eliquis  RADIOLOGY: No results found.  IMPRESSION: 1. Cardiac resynchronization therapy defibrillator (CRT-D) in place   2. Ischemic cardiomyopathy   3. Chronic systolic (congestive) heart failure (HCC)   4. Coronary artery disease involving native coronary artery of native heart without angina pectoris   5. OSA (obstructive sleep apnea)   6. Complete heart block (HCC)   7. Primary hypertension   8. CKD (chronic kidney disease), stage IV (HCC)   9. Tobacco abuse   10. Long term (current) use of anticoagulants     ASSESSMENT AND PLAN:  Mr. Holzapfel is a 77 year old Caucasian male who suffered an acute coronary syndrome on 08/04/2012 when he presented with subtotal occlusion of his RCA. He had diffuse disease beyond the subtotal occlusion and ultimately had successful insertion of a 3.25x32 mm Promus DES stent. He had concomitant CAD with 40-50% proximal LAD narrowing, 30% circumflex marginal stenoses. NMR lipoprofile after initiation of atorvastatin in January 2014 showed marked improvement in total cholesterol of 119 LDL 48 LDL particle #992. HDL was very low at 30 with an increased triglyceride at 203 and significantly reduced HDL particle #24.2. His insulin resistance score was elevated at 65.  He suffered an anterior STEMI in December 2018 and underwent successful stenting and several days later he required staged intervention to circumflex and marginal vessel. 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  Warfarin was initially prescribed but due to  significant issues with compliance and overmedication the decision was made that he would be safer using Eliquis even though he is on this for apical thrombus rather than atrial fibrillation.   He was subsequently found to have significant obstructive sleep apnea but stopped therapy and uses nocturnal  oxygen.  An echo Doppler study in June 2021 showed an EF at 35 to 40% with moderate dilation of LV internal dimensions.  There was mild LVH and grade 1 diastolic dysfunction.  There was evidence for moderate aortic sclerosis with mild mitral annular calcification.  There was no evidence for his previous apical thrombus.  An echo study from June 05, 2021 continued to show reduced EF at 30 to 35%, mild mitral regurgitation, and mild to moderate aortic sclerosis without stenosis.  Previously, he had developed worsening renal function and hyperkalemia necessitating discontinuance of losartan and spironolactone.   He was hospitalized in January 2023 with significant hypoxia, required intubation, but was felt to have septic shock for which he was treated with a brief course of pressor therapy.  He was treated with ceftriaxone and azithromycin in addition to prednisone.  He was subsequently seen by his primary physician Dr. Hal Hope on several occasions.  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  With his history of heart failure he underwent successful BiV ICD insertion CRT-D by Dr. Elberta Fortis.  Current device checks have been stable.  Serum creatinine May 10 was 2.76.  Most recently on July 18 had improved to 2.28.  He is on nitrate/hydralazine in addition to carvedilol, furosemide, loaded pain.  He is not having anginal symptoms.  Presently he appears fairly euvolemic without volume overload.  He no longer uses CPAP therapy.  An Epworth scale was calculated in the office today and this endorsed at 12 consistent with mild residual daytime sleepiness.  He continues to be on a statin 80 mg for hyperlipidemia.  He is on Trelegy and Orvis Brill Aerosphere for his COPD.  He is scheduled to undergo renal duplex scan late this week for assessment.  He sees Nadyne Coombes for primary care.  I will see him in 6 months for cardiology evaluation or sooner as needed.    Lennette Bihari, MD,  [facility]  04/22/2023 1:24 PM

## 2023-04-26 ENCOUNTER — Ambulatory Visit (HOSPITAL_COMMUNITY)
Admission: RE | Admit: 2023-04-26 | Discharge: 2023-04-26 | Disposition: A | Discharge status: Home / Self Care | Payer: 59 | Source: Ambulatory Visit | Attending: Cardiovascular Disease | Admitting: Cardiovascular Disease

## 2023-04-26 DIAGNOSIS — N183 Chronic kidney disease, stage 3 unspecified: Secondary | ICD-10-CM

## 2023-04-26 DIAGNOSIS — N289 Disorder of kidney and ureter, unspecified: Secondary | ICD-10-CM | POA: Insufficient documentation

## 2023-05-14 ENCOUNTER — Telehealth: Payer: Self-pay | Admitting: Cardiovascular Disease

## 2023-05-14 NOTE — Telephone Encounter (Signed)
Sister is calling in to get results. Please advise

## 2023-05-14 NOTE — Telephone Encounter (Signed)
Call to sister and the test she is referring to was ordered by PCP. Advised to speak with them for results.

## 2023-06-07 ENCOUNTER — Other Ambulatory Visit: Payer: Self-pay | Admitting: Internal Medicine

## 2023-06-07 DIAGNOSIS — N184 Chronic kidney disease, stage 4 (severe): Secondary | ICD-10-CM

## 2023-06-07 DIAGNOSIS — N2889 Other specified disorders of kidney and ureter: Secondary | ICD-10-CM

## 2023-06-07 DIAGNOSIS — Z720 Tobacco use: Secondary | ICD-10-CM

## 2023-06-10 ENCOUNTER — Ambulatory Visit (INDEPENDENT_AMBULATORY_CARE_PROVIDER_SITE_OTHER): Payer: 59

## 2023-06-10 DIAGNOSIS — I442 Atrioventricular block, complete: Secondary | ICD-10-CM

## 2023-06-10 DIAGNOSIS — I255 Ischemic cardiomyopathy: Secondary | ICD-10-CM

## 2023-06-11 ENCOUNTER — Other Ambulatory Visit: Payer: Self-pay | Admitting: Cardiology

## 2023-06-12 ENCOUNTER — Inpatient Hospital Stay
Admission: RE | Admit: 2023-06-12 | Discharge: 2023-06-12 | Discharge status: Home / Self Care | Payer: 59 | Source: Ambulatory Visit | Attending: Internal Medicine | Admitting: Internal Medicine

## 2023-06-12 DIAGNOSIS — N2889 Other specified disorders of kidney and ureter: Secondary | ICD-10-CM

## 2023-06-12 DIAGNOSIS — N184 Chronic kidney disease, stage 4 (severe): Secondary | ICD-10-CM

## 2023-06-12 DIAGNOSIS — Z720 Tobacco use: Secondary | ICD-10-CM

## 2023-06-12 LAB — CUP PACEART REMOTE DEVICE CHECK
Battery Remaining Longevity: 54 mo
Battery Remaining Percentage: 77 %
Battery Voltage: 2.96 V
Brady Statistic AP VP Percent: 11 %
Brady Statistic AP VS Percent: 1 %
Brady Statistic AS VP Percent: 88 %
Brady Statistic AS VS Percent: 1 %
Brady Statistic RA Percent Paced: 11 %
Date Time Interrogation Session: 20241008172229
HighPow Impedance: 80 Ohm
Implantable Lead Connection Status: 753985
Implantable Lead Connection Status: 753985
Implantable Lead Connection Status: 753985
Implantable Lead Implant Date: 20230709
Implantable Lead Implant Date: 20230709
Implantable Lead Implant Date: 20230709
Implantable Lead Location: 753858
Implantable Lead Location: 753859
Implantable Lead Location: 753860
Implantable Pulse Generator Implant Date: 20230709
Lead Channel Impedance Value: 430 Ohm
Lead Channel Impedance Value: 610 Ohm
Lead Channel Impedance Value: 890 Ohm
Lead Channel Pacing Threshold Amplitude: 0.75 V
Lead Channel Pacing Threshold Amplitude: 1 V
Lead Channel Pacing Threshold Amplitude: 3.25 V
Lead Channel Pacing Threshold Pulse Width: 0.5 ms
Lead Channel Pacing Threshold Pulse Width: 0.5 ms
Lead Channel Pacing Threshold Pulse Width: 0.5 ms
Lead Channel Sensing Intrinsic Amplitude: 12 mV
Lead Channel Sensing Intrinsic Amplitude: 2.9 mV
Lead Channel Setting Pacing Amplitude: 1.75 V
Lead Channel Setting Pacing Amplitude: 3.5 V
Lead Channel Setting Pacing Amplitude: 3.75 V
Lead Channel Setting Pacing Pulse Width: 0.5 ms
Lead Channel Setting Pacing Pulse Width: 0.5 ms
Lead Channel Setting Sensing Sensitivity: 0.5 mV
Pulse Gen Serial Number: 810036011

## 2023-06-24 NOTE — Progress Notes (Signed)
Remote ICD transmission.   

## 2023-07-10 ENCOUNTER — Other Ambulatory Visit: Payer: Self-pay | Admitting: Cardiovascular Disease

## 2023-07-30 ENCOUNTER — Other Ambulatory Visit: Payer: Self-pay | Admitting: Acute Care

## 2023-07-30 DIAGNOSIS — Z87891 Personal history of nicotine dependence: Secondary | ICD-10-CM

## 2023-07-30 DIAGNOSIS — Z122 Encounter for screening for malignant neoplasm of respiratory organs: Secondary | ICD-10-CM

## 2023-07-30 DIAGNOSIS — F17210 Nicotine dependence, cigarettes, uncomplicated: Secondary | ICD-10-CM

## 2023-08-07 NOTE — Progress Notes (Addendum)
ADVANCED HEART FAILURE CLINIC NOTE  Referring Physician: Dois Davenport, MD  Primary Care: Bobby Davenport, MD Primary Cardiologist: Dr. Tresa [name]  HPI: Bobby [name] is a 77 y.o. 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  Interval hx:  Bobby [name] reports that he has felt much better since placement of CRT-D; he can perform ADLs independently. He has no PND, orthopnea and now minimal lower extremity edema. His wife is here with him today. She reports that despite his reduction in LVEF he is raking the yard, going on walks, etc.   Activity level/exercise tolerance:  NYHA II-III Orthopnea:  Sleeps on 1-2 pillows Paroxysmal noctural dyspnea:  no Chest pain/pressure:  no Orthostatic lightheadedness:  no Palpitations:  no Lower extremity edema:  no Presyncope/syncope:  no Cough:  no  Past Medical History:  Diagnosis Date   DM (diabetes mellitus), type 2 new diagnosis 08/05/2012   diet control   ED (erectile dysfunction)    surgery planned   Hyperlipidemia 08/05/2012   Hypertensive heart disease    Panic attack    Pulmonary HTN (HCC)    echo 08/05/12, EF 55-60%, PA pressure 45mm   S/P coronary artery stent placement, to RCA Promus DES 08/05/2012   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   Tobacco abuse     Current Outpatient Medications  Medication Sig Dispense Refill   acetaminophen (TYLENOL) 325 MG tablet Take 2 tablets (650 mg total) by mouth every 4 (four) hours as needed for headache or mild pain.     albuterol (PROVENTIL HFA;VENTOLIN HFA) 108 (90 Base) MCG/ACT inhaler Inhale 2 puffs into the lungs every 6 (six) hours as needed for  wheezing or shortness of breath. 1 Inhaler 2   amLODipine (NORVASC) 5 MG tablet TAKE ONE TABLET BY MOUTH DAILY 180 tablet 3   apixaban (ELIQUIS) 5 MG TABS tablet Take 1 tablet (5 mg total) by mouth 2 (two) times daily. 28 tablet 1   aspirin EC 81 MG tablet Take 1 tablet (81 mg total) by mouth daily. Swallow whole. 90 tablet 3   atorvastatin (LIPITOR) 80 MG tablet Take 1 tablet (80 mg total) by mouth daily at 6 PM. 90 tablet 3   BREZTRI AEROSPHERE 160-9-4.8 MCG/ACT AERO Inhale 2 puffs into the lungs 2 (two) times daily.     buPROPion (WELLBUTRIN XL) 300 MG 24 hr tablet Take 300 mg by mouth daily.     carvedilol (COREG) 25 MG tablet TAKE ONE TABLET BY MOUTH TWICE DAILY 60 tablet 4   clonazePAM (KLONOPIN) 1 MG tablet Take 1 mg by mouth 2 (two) times daily as needed for anxiety.     Coenzyme Q10 (COQ10 PO) Take 1 capsule by mouth daily.     COLCRYS 0.6 MG tablet Take 0.6 mg by mouth daily.  3   empagliflozin (JARDIANCE) 10 MG TABS tablet Take 1 tablet (10 mg total) by mouth daily before breakfast. 90 tablet 3   ferrous sulfate 325 (65 FE) MG EC tablet Take 325 mg by mouth 2 (two) times a week.     hydrALAZINE (APRESOLINE) 50 MG tablet Take 1 tablet (50 mg total) by mouth every 8 (eight) hours.  90 tablet 6   isosorbide mononitrate (IMDUR) 30 MG 24 hr tablet Take 1 tablet (30 mg total) by mouth daily. 30 tablet 6   nitroGLYCERIN (NITROSTAT) 0.4 MG SL tablet DISSOLVE 1 TABLET UNDER THE TONGUE EVERY 5 MINUTES AS NEEDED FOR CHEST PAIN. DO NOT EXCEED A TOTAL OF 3 DOSES IN 15 MINUTES. 25 tablet 10   Omega-3 Fatty Acids (FISH OIL PO) Take 1 tablet by mouth daily.     oxyCODONE-acetaminophen (PERCOCET) 10-325 MG per tablet Take 1 tablet by mouth every 6 (six) hours as needed for pain.     predniSONE (DELTASONE) 10 MG tablet Take 10 mg by mouth daily.     sertraline (ZOLOFT) 100 MG tablet Take 100 mg by mouth daily.     TRELEGY ELLIPTA 200-62.5-25 MCG/ACT AEPB Inhale 1 puff into the lungs daily.     Vitamin  D, Ergocalciferol, (DRISDOL) 1.25 MG (50000 UNIT) CAPS capsule Take 50,000 Units by mouth 2 (two) times a week.     furosemide (LASIX) 20 MG tablet Take 1 tablet (20 mg total) by mouth daily. 30 tablet 3   No current facility-administered medications for this encounter.    Allergies  Allergen Reactions   Hydrocodone Itching      Social History   Socioeconomic History   Marital status: Legally Separated    Spouse name: Not on file   Number of children: Not on file   Years of education: Not on file   Highest education level: Not on file  Occupational History   Not on file  Tobacco Use   Smoking status: Every Day    Current packs/day: 0.25    Average packs/day: 0.3 packs/day for 60.0 years (15.0 ttl pk-yrs)    Types: Cigarettes   Smokeless tobacco: Never   Tobacco comments:    Currently smoking about .5ppd as of 06/14/21 ep    Patient smokes 1/2 pack daily 04/06/2022  Substance and Sexual Activity   Alcohol use: Not Currently    Comment: ocasionally   Drug use: No   Sexual activity: Not on file  Other Topics Concern   Not on file  Social History Narrative   Not on file   Social Determinants of Health   Financial Resource Strain: Not on file  Food Insecurity: Not on file  Transportation Needs: Not on file  Physical Activity: Not on file  Stress: Not on file  Social Connections: Not on file  Intimate Partner Violence: Not on file      Family History  Problem Relation Age of Onset   Kidney disease Father        kidney disease and heart disease   Heart disease Maternal Grandmother    Stomach cancer Maternal Grandfather    Heart attack Paternal Grandmother     PHYSICAL EXAM: Vitals:   08/08/23 1141  BP: (!) 120/58  Pulse: 62  SpO2: 90%   GENERAL: Well nourished, well developed, and in no apparent distress at rest.  HEENT: Negative for arcus senilis or xanthelasma. There is no scleral icterus.  The mucous membranes are pink and moist.   NECK: Supple, No  masses. Normal carotid upstrokes without bruits. No masses or thyromegaly.    CHEST: There are no chest wall deformities. There is no chest wall tenderness. Respirations are unlabored.  Lungs- CTA B/L CARDIAC:  JVP: 7 cm H2O         Normal S1, S2  Normal rate with regular rhythm. No murmurs, rubs or gallops.  Pulses  are 2+ and symmetrical in upper and lower extremities. No edema.  ABDOMEN: Soft, non-tender, non-distended. There are no masses or hepatomegaly. There are normal bowel sounds.  EXTREMITIES: Warm and well perfused with no cyanosis, clubbing.  LYMPHATIC: No axillary or supraclavicular lymphadenopathy.  NEUROLOGIC: Patient is oriented x3 with no focal or lateralizing neurologic deficits.  PSYCH: Patients affect is appropriate, there is no evidence of anxiety or depression.  SKIN: Warm and dry; no lesions or wounds.   DATA REVIEW  ECG: 04/22/23:A-V dual paced rhythm   ECHO: 10/17/22: LVEF 35%-40%, normal RV function. 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  02/22/23: LVEF 35-40%  CATH: 08/12/2017: reviously placed Prox LAD to Mid LAD stent (unknown type) is widely patent. Mid Cx lesion is 99% stenosed. A drug-eluting stent was successfully placed using a STENT SIERRA 3.00 X 28 MM. Post intervention, there is a 0% residual stenosis. Ost 1st Mrg to 1st Mrg lesion is 85% stenosed. A drug-eluting stent was successfully placed using a STENT RESOLUTE ONYX 3.0X18. Post intervention, there is a 0% residual stenosis.   1. Successful stenting of the mid LCx with a DES 2. Successful stenting of the first OM with DES   ASSESSMENT & PLAN:  Heart failure with reduced ejection fraction Etiology of HF: Ischemic cardiomyopathy with inferior MI in 2013 and anterior MI in 2018; also has significant left circumflex disease NYHA class / AHA Stage:NYHA II-III Volume status & Diuretics: decrease lasix to 20mg  daily; euvolemic in  exam.  Vasodilators:amlodipine 5mg , imdur 30mg , hydralazine 50mg  TID Beta-Blocker:coreg 25mg  BID MRA:he has advanced CKD; sCr >2.2. If it improves we will start spironolactone Cardiometabolic:start jardiance 10mg  daily; repeat labs in 1 week.  Devices therapies & Valvulopathies:CRT-D Advanced therapies:Not a candidate.   2. CAD - s/p PCI to the RCA in 12/13 for inferior STEMI - s/p PCI to the LAD for anterior STEMI in 12/18 - s/p PCI to the mid LCX and OM1 in 12/18 - Continue lipitor 80mg  daily  3. Obstructive sleep apnea -No longer using CPAP  4. COPD -Currently on Trelegy and Brestri -No wheezing on exam  5. Chronic kidney disease - Jardiance 10mg  daily - repeat BMP/BNP today.   6. Hyperlipidemia - continue lipitor  7. Tobacco use - continues to smoke 1/2PPD - discussed importance of tobacco cessation.    Billi [facility] Mechanical Circulatory Support

## 2023-08-08 ENCOUNTER — Ambulatory Visit (HOSPITAL_COMMUNITY)
Admission: RE | Admit: 2023-08-08 | Discharge: 2023-08-08 | Disposition: A | Discharge status: Home / Self Care | Payer: 59 | Source: Ambulatory Visit | Attending: Cardiology | Admitting: Cardiology

## 2023-08-08 ENCOUNTER — Encounter (HOSPITAL_COMMUNITY): Payer: Self-pay | Admitting: Cardiology

## 2023-08-08 ENCOUNTER — Other Ambulatory Visit (HOSPITAL_COMMUNITY): Payer: Self-pay

## 2023-08-08 VITALS — BP 120/58 | HR 62 | Wt 264.4 lb

## 2023-08-08 DIAGNOSIS — F17210 Nicotine dependence, cigarettes, uncomplicated: Secondary | ICD-10-CM | POA: Insufficient documentation

## 2023-08-08 DIAGNOSIS — J449 Chronic obstructive pulmonary disease, unspecified: Secondary | ICD-10-CM | POA: Insufficient documentation

## 2023-08-08 DIAGNOSIS — Z8673 Personal history of transient ischemic attack (TIA), and cerebral infarction without residual deficits: Secondary | ICD-10-CM | POA: Diagnosis not present

## 2023-08-08 DIAGNOSIS — Z716 Tobacco abuse counseling: Secondary | ICD-10-CM | POA: Diagnosis not present

## 2023-08-08 DIAGNOSIS — I252 Old myocardial infarction: Secondary | ICD-10-CM | POA: Insufficient documentation

## 2023-08-08 DIAGNOSIS — I255 Ischemic cardiomyopathy: Secondary | ICD-10-CM | POA: Insufficient documentation

## 2023-08-08 DIAGNOSIS — E785 Hyperlipidemia, unspecified: Secondary | ICD-10-CM | POA: Diagnosis not present

## 2023-08-08 DIAGNOSIS — N189 Chronic kidney disease, unspecified: Secondary | ICD-10-CM | POA: Diagnosis not present

## 2023-08-08 DIAGNOSIS — N1832 Chronic kidney disease, stage 3b: Secondary | ICD-10-CM

## 2023-08-08 DIAGNOSIS — Z955 Presence of coronary angioplasty implant and graft: Secondary | ICD-10-CM | POA: Insufficient documentation

## 2023-08-08 DIAGNOSIS — Z79899 Other long term (current) drug therapy: Secondary | ICD-10-CM | POA: Diagnosis not present

## 2023-08-08 DIAGNOSIS — I130 Hypertensive heart and chronic kidney disease with heart failure and stage 1 through stage 4 chronic kidney disease, or unspecified chronic kidney disease: Secondary | ICD-10-CM | POA: Diagnosis not present

## 2023-08-08 DIAGNOSIS — Z720 Tobacco use: Secondary | ICD-10-CM

## 2023-08-08 DIAGNOSIS — Z7984 Long term (current) use of oral hypoglycemic drugs: Secondary | ICD-10-CM | POA: Diagnosis not present

## 2023-08-08 DIAGNOSIS — I5022 Chronic systolic (congestive) heart failure: Secondary | ICD-10-CM | POA: Diagnosis not present

## 2023-08-08 DIAGNOSIS — E1122 Type 2 diabetes mellitus with diabetic chronic kidney disease: Secondary | ICD-10-CM | POA: Diagnosis not present

## 2023-08-08 DIAGNOSIS — G4733 Obstructive sleep apnea (adult) (pediatric): Secondary | ICD-10-CM | POA: Insufficient documentation

## 2023-08-08 DIAGNOSIS — I2510 Atherosclerotic heart disease of native coronary artery without angina pectoris: Secondary | ICD-10-CM | POA: Diagnosis not present

## 2023-08-08 LAB — BASIC METABOLIC PANEL
Anion gap: 6 (ref 5–15)
BUN: 42 mg/dL — ABNORMAL HIGH (ref 8–23)
CO2: 22 mmol/L (ref 22–32)
Calcium: 9.4 mg/dL (ref 8.9–10.3)
Chloride: 111 mmol/L (ref 98–111)
Creatinine, Ser: 2.6 mg/dL — ABNORMAL HIGH (ref 0.61–1.24)
GFR, Estimated: 25 mL/min — ABNORMAL LOW (ref >60)
Glucose, Bld: 106 mg/dL — ABNORMAL HIGH (ref 70–99)
Potassium: 4.8 mmol/L (ref 3.5–5.1)
Sodium: 139 mmol/L (ref 135–145)

## 2023-08-08 LAB — BRAIN NATRIURETIC PEPTIDE: B Natriuretic Peptide: 151.8 pg/mL — ABNORMAL HIGH (ref 0.0–100.0)

## 2023-08-08 MED ORDER — EMPAGLIFLOZIN 10 MG PO TABS: 10.0000 mg | ORAL_TABLET | Freq: Every day | ORAL | 3 refills | Status: DC

## 2023-08-08 MED ORDER — FUROSEMIDE 20 MG PO TABS: 20.0000 mg | ORAL_TABLET | Freq: Every day | ORAL | 3 refills | Status: DC

## 2023-08-08 NOTE — Patient Instructions (Signed)
Medication Changes:  DECREASE LASIX (FUROSEMIDE) TO 20MG  DAILY   START: JARDIANCE 10MG  ONCE DAILY   Lab Work:  Labs done today, your results will be available in MyChart, we will contact you for abnormal readings.  THEN RETURN AGAIN NEXT WEEK FOR REPEAT LABS AS SCHEDULED   Follow-Up in: 2 MONTHS PLEASE CALL OUR OFFICE AROUND THE END OF DECEMBER TO GET SCHEDULED FOR YOUR APPOINTMENT. PHONE NUMBER IS [phone] OPTION 2   At the [facility], you and your health needs are our priority. We have a designated team specialized in the treatment of Heart Failure. This Care Team includes your primary Heart Failure Specialized Cardiologist (physician), Advanced Practice Providers (APPs- Physician Assistants and Nurse Practitioners), and Pharmacist who all work together to provide you with the care you need, when you need it.   You may see any of the following providers on your designated Care Team at your next follow up:  Dr. Arvilla Meres Dr. Marca Ancona Dr. Dorthula Nettles Dr. Theresia Bough Tonye Becket, NP Robbie Lis, [place] [facility] [place], [place] Brynda Peon, NP [place] Lee, NP Karle Plumber, PharmD   Please be sure to bring in all your medications bottles to every appointment.   Need to Contact us:  If you have any questions or concerns before your next appointment please send [place] a message through [place] or call our office at [phone].    TO LEAVE A MESSAGE FOR THE NURSE SELECT OPTION 2, PLEASE LEAVE A MESSAGE INCLUDING: YOUR NAME DATE OF BIRTH CALL BACK NUMBER REASON FOR CALL**this is important as we prioritize the call backs  YOU WILL RECEIVE A CALL BACK THE SAME DAY AS LONG AS YOU CALL BEFORE 4:00 PM

## 2023-08-16 ENCOUNTER — Other Ambulatory Visit (HOSPITAL_COMMUNITY): Payer: 59

## 2023-08-23 ENCOUNTER — Ambulatory Visit (HOSPITAL_COMMUNITY)
Admission: RE | Admit: 2023-08-23 | Discharge: 2023-08-23 | Disposition: A | Discharge status: Home / Self Care | Payer: 59 | Source: Ambulatory Visit | Attending: Cardiology | Admitting: Cardiology

## 2023-08-23 DIAGNOSIS — I5022 Chronic systolic (congestive) heart failure: Secondary | ICD-10-CM | POA: Insufficient documentation

## 2023-08-23 LAB — BASIC METABOLIC PANEL
Anion gap: 9 (ref 5–15)
BUN: 47 mg/dL — ABNORMAL HIGH (ref 8–23)
CO2: 21 mmol/L — ABNORMAL LOW (ref 22–32)
Calcium: 9.7 mg/dL (ref 8.9–10.3)
Chloride: 109 mmol/L (ref 98–111)
Creatinine, Ser: 2.5 mg/dL — ABNORMAL HIGH (ref 0.61–1.24)
GFR, Estimated: 26 mL/min — ABNORMAL LOW (ref >60)
Glucose, Bld: 117 mg/dL — ABNORMAL HIGH (ref 70–99)
Potassium: 4.7 mmol/L (ref 3.5–5.1)
Sodium: 139 mmol/L (ref 135–145)

## 2023-08-30 ENCOUNTER — Ambulatory Visit
Admission: RE | Admit: 2023-08-30 | Discharge: 2023-08-30 | Disposition: A | Discharge status: Home / Self Care | Payer: 59 | Source: Ambulatory Visit | Attending: Acute Care | Admitting: Acute Care

## 2023-08-30 DIAGNOSIS — Z87891 Personal history of nicotine dependence: Secondary | ICD-10-CM

## 2023-08-30 DIAGNOSIS — F17210 Nicotine dependence, cigarettes, uncomplicated: Secondary | ICD-10-CM

## 2023-08-30 DIAGNOSIS — Z122 Encounter for screening for malignant neoplasm of respiratory organs: Secondary | ICD-10-CM

## 2023-09-09 ENCOUNTER — Ambulatory Visit (INDEPENDENT_AMBULATORY_CARE_PROVIDER_SITE_OTHER): Payer: 59

## 2023-09-09 DIAGNOSIS — I442 Atrioventricular block, complete: Secondary | ICD-10-CM

## 2023-09-09 DIAGNOSIS — I255 Ischemic cardiomyopathy: Secondary | ICD-10-CM

## 2023-09-09 LAB — CUP PACEART REMOTE DEVICE CHECK
Battery Remaining Longevity: 53 mo
Battery Remaining Percentage: 73 %
Battery Voltage: 2.96 V
Brady Statistic AP VP Percent: 13 %
Brady Statistic AP VS Percent: 1 %
Brady Statistic AS VP Percent: 86 %
Brady Statistic AS VS Percent: 1 %
Brady Statistic RA Percent Paced: 13 %
Date Time Interrogation Session: 20250106090750
HighPow Impedance: 72 Ohm
Implantable Lead Connection Status: 753985
Implantable Lead Connection Status: 753985
Implantable Lead Connection Status: 753985
Implantable Lead Implant Date: 20230709
Implantable Lead Implant Date: 20230709
Implantable Lead Implant Date: 20230709
Implantable Lead Location: 753858
Implantable Lead Location: 753859
Implantable Lead Location: 753860
Implantable Pulse Generator Implant Date: 20230709
Lead Channel Impedance Value: 1025 Ohm
Lead Channel Impedance Value: 410 Ohm
Lead Channel Impedance Value: 600 Ohm
Lead Channel Pacing Threshold Amplitude: 0.75 V
Lead Channel Pacing Threshold Amplitude: 1 V
Lead Channel Pacing Threshold Amplitude: 2.875 V
Lead Channel Pacing Threshold Pulse Width: 0.5 ms
Lead Channel Pacing Threshold Pulse Width: 0.5 ms
Lead Channel Pacing Threshold Pulse Width: 0.5 ms
Lead Channel Sensing Intrinsic Amplitude: 12 mV
Lead Channel Sensing Intrinsic Amplitude: 3.5 mV
Lead Channel Setting Pacing Amplitude: 1.75 V
Lead Channel Setting Pacing Amplitude: 3.375
Lead Channel Setting Pacing Amplitude: 3.5 V
Lead Channel Setting Pacing Pulse Width: 0.5 ms
Lead Channel Setting Pacing Pulse Width: 0.5 ms
Lead Channel Setting Sensing Sensitivity: 0.5 mV
Pulse Gen Serial Number: 810036011

## 2023-09-23 ENCOUNTER — Telehealth: Payer: Self-pay | Admitting: Acute Care

## 2023-09-23 NOTE — Telephone Encounter (Signed)
Called and spoke with pt and pt's sister, Vickie (on [place]). Informed them of the results of the LDCT with stable lung nodules. Patient is 77yo and has aged out of the screening program, pt is aware of this. Also informed them of the new finding of tracheobronchomalacia. We discussed the meaning and the symptoms. Patient does have trouble breathing and is currently on oxygen. Patient's sister states patient likely would not be a good candidate for surgery due to his cardiac history. They would like the results sent to PCP and cardiologist to discuss the findings with them and next steps.

## 2023-09-23 NOTE — Telephone Encounter (Signed)
Patient had annual LDCT on 08/29/2024 that resulted as LR2. There is mention of tracheobronchomalacia on this scan. Kandice Robinsons NP has reviewed scan and states pt should be referred to thoracic surgery if pt is symptomatic. Tracheobronchomalacia is weakness in the trachea causing collapse during inspiration. Patient could have an inspiratory wheeze or difficulty breathing. If pt is having trouble with breathing then he needs a referral. Otherwise results can be sent to PCP with this information. Patient is 77yo and will age out of the program, PCP will need to be informed of this as well.

## 2023-09-30 ENCOUNTER — Emergency Department (HOSPITAL_COMMUNITY): Payer: 59

## 2023-09-30 ENCOUNTER — Inpatient Hospital Stay (HOSPITAL_COMMUNITY)
Admission: EM | Admit: 2023-09-30 | Discharge: 2023-10-11 | DRG: 853 | Disposition: A | Discharge status: Skilled Nursing Facility | Payer: 59 | Attending: Internal Medicine | Admitting: Internal Medicine

## 2023-09-30 ENCOUNTER — Other Ambulatory Visit: Payer: Self-pay

## 2023-09-30 ENCOUNTER — Encounter (HOSPITAL_COMMUNITY): Payer: Self-pay | Admitting: Emergency Medicine

## 2023-09-30 DIAGNOSIS — Z635 Disruption of family by separation and divorce: Secondary | ICD-10-CM

## 2023-09-30 DIAGNOSIS — F419 Anxiety disorder, unspecified: Secondary | ICD-10-CM | POA: Diagnosis present

## 2023-09-30 DIAGNOSIS — I272 Pulmonary hypertension, unspecified: Secondary | ICD-10-CM | POA: Diagnosis present

## 2023-09-30 DIAGNOSIS — K567 Ileus, unspecified: Secondary | ICD-10-CM | POA: Diagnosis not present

## 2023-09-30 DIAGNOSIS — J690 Pneumonitis due to inhalation of food and vomit: Secondary | ICD-10-CM | POA: Diagnosis present

## 2023-09-30 DIAGNOSIS — E8721 Acute metabolic acidosis: Secondary | ICD-10-CM | POA: Diagnosis not present

## 2023-09-30 DIAGNOSIS — G4733 Obstructive sleep apnea (adult) (pediatric): Secondary | ICD-10-CM | POA: Diagnosis present

## 2023-09-30 DIAGNOSIS — Z8673 Personal history of transient ischemic attack (TIA), and cerebral infarction without residual deficits: Secondary | ICD-10-CM

## 2023-09-30 DIAGNOSIS — J9621 Acute and chronic respiratory failure with hypoxia: Secondary | ICD-10-CM | POA: Diagnosis present

## 2023-09-30 DIAGNOSIS — G8929 Other chronic pain: Secondary | ICD-10-CM | POA: Diagnosis present

## 2023-09-30 DIAGNOSIS — Z79899 Other long term (current) drug therapy: Secondary | ICD-10-CM

## 2023-09-30 DIAGNOSIS — J449 Chronic obstructive pulmonary disease, unspecified: Secondary | ICD-10-CM | POA: Diagnosis not present

## 2023-09-30 DIAGNOSIS — N184 Chronic kidney disease, stage 4 (severe): Secondary | ICD-10-CM | POA: Diagnosis present

## 2023-09-30 DIAGNOSIS — M549 Dorsalgia, unspecified: Secondary | ICD-10-CM | POA: Diagnosis present

## 2023-09-30 DIAGNOSIS — E785 Hyperlipidemia, unspecified: Secondary | ICD-10-CM | POA: Diagnosis present

## 2023-09-30 DIAGNOSIS — E878 Other disorders of electrolyte and fluid balance, not elsewhere classified: Secondary | ICD-10-CM | POA: Diagnosis present

## 2023-09-30 DIAGNOSIS — E1122 Type 2 diabetes mellitus with diabetic chronic kidney disease: Secondary | ICD-10-CM | POA: Diagnosis present

## 2023-09-30 DIAGNOSIS — I4510 Unspecified right bundle-branch block: Secondary | ICD-10-CM | POA: Diagnosis not present

## 2023-09-30 DIAGNOSIS — K35219 Acute appendicitis with generalized peritonitis, with abscess, unspecified as to perforation: Secondary | ICD-10-CM | POA: Diagnosis not present

## 2023-09-30 DIAGNOSIS — Z955 Presence of coronary angioplasty implant and graft: Secondary | ICD-10-CM

## 2023-09-30 DIAGNOSIS — E1165 Type 2 diabetes mellitus with hyperglycemia: Secondary | ICD-10-CM | POA: Diagnosis present

## 2023-09-30 DIAGNOSIS — Z95810 Presence of automatic (implantable) cardiac defibrillator: Secondary | ICD-10-CM

## 2023-09-30 DIAGNOSIS — K353 Acute appendicitis with localized peritonitis, without perforation or gangrene: Secondary | ICD-10-CM | POA: Diagnosis not present

## 2023-09-30 DIAGNOSIS — I5022 Chronic systolic (congestive) heart failure: Secondary | ICD-10-CM | POA: Diagnosis present

## 2023-09-30 DIAGNOSIS — Z6839 Body mass index (BMI) 39.0-39.9, adult: Secondary | ICD-10-CM

## 2023-09-30 DIAGNOSIS — K37 Unspecified appendicitis: Secondary | ICD-10-CM | POA: Diagnosis present

## 2023-09-30 DIAGNOSIS — I130 Hypertensive heart and chronic kidney disease with heart failure and stage 1 through stage 4 chronic kidney disease, or unspecified chronic kidney disease: Secondary | ICD-10-CM | POA: Diagnosis present

## 2023-09-30 DIAGNOSIS — E119 Type 2 diabetes mellitus without complications: Secondary | ICD-10-CM | POA: Diagnosis not present

## 2023-09-30 DIAGNOSIS — Z01810 Encounter for preprocedural cardiovascular examination: Secondary | ICD-10-CM | POA: Diagnosis not present

## 2023-09-30 DIAGNOSIS — J9811 Atelectasis: Secondary | ICD-10-CM | POA: Diagnosis present

## 2023-09-30 DIAGNOSIS — I952 Hypotension due to drugs: Secondary | ICD-10-CM | POA: Diagnosis not present

## 2023-09-30 DIAGNOSIS — N183 Chronic kidney disease, stage 3 unspecified: Secondary | ICD-10-CM | POA: Diagnosis not present

## 2023-09-30 DIAGNOSIS — Z720 Tobacco use: Secondary | ICD-10-CM | POA: Diagnosis present

## 2023-09-30 DIAGNOSIS — Z9911 Dependence on respirator [ventilator] status: Secondary | ICD-10-CM | POA: Diagnosis not present

## 2023-09-30 DIAGNOSIS — G9341 Metabolic encephalopathy: Secondary | ICD-10-CM | POA: Diagnosis present

## 2023-09-30 DIAGNOSIS — E872 Acidosis, unspecified: Secondary | ICD-10-CM | POA: Diagnosis present

## 2023-09-30 DIAGNOSIS — J9622 Acute and chronic respiratory failure with hypercapnia: Secondary | ICD-10-CM | POA: Diagnosis present

## 2023-09-30 DIAGNOSIS — R310 Gross hematuria: Secondary | ICD-10-CM | POA: Diagnosis present

## 2023-09-30 DIAGNOSIS — R6521 Severe sepsis with septic shock: Secondary | ICD-10-CM | POA: Diagnosis present

## 2023-09-30 DIAGNOSIS — I452 Bifascicular block: Secondary | ICD-10-CM | POA: Diagnosis present

## 2023-09-30 DIAGNOSIS — Z7984 Long term (current) use of oral hypoglycemic drugs: Secondary | ICD-10-CM

## 2023-09-30 DIAGNOSIS — F32A Depression, unspecified: Secondary | ICD-10-CM | POA: Diagnosis present

## 2023-09-30 DIAGNOSIS — E669 Obesity, unspecified: Secondary | ICD-10-CM | POA: Diagnosis present

## 2023-09-30 DIAGNOSIS — M6281 Muscle weakness (generalized): Secondary | ICD-10-CM | POA: Diagnosis present

## 2023-09-30 DIAGNOSIS — K35209 Acute appendicitis with generalized peritonitis, without abscess, unspecified as to perforation: Secondary | ICD-10-CM | POA: Diagnosis not present

## 2023-09-30 DIAGNOSIS — K9189 Other postprocedural complications and disorders of digestive system: Secondary | ICD-10-CM | POA: Diagnosis not present

## 2023-09-30 DIAGNOSIS — F17210 Nicotine dependence, cigarettes, uncomplicated: Secondary | ICD-10-CM | POA: Diagnosis present

## 2023-09-30 DIAGNOSIS — N179 Acute kidney failure, unspecified: Secondary | ICD-10-CM | POA: Diagnosis present

## 2023-09-30 DIAGNOSIS — Z8249 Family history of ischemic heart disease and other diseases of the circulatory system: Secondary | ICD-10-CM

## 2023-09-30 DIAGNOSIS — I252 Old myocardial infarction: Secondary | ICD-10-CM

## 2023-09-30 DIAGNOSIS — I2510 Atherosclerotic heart disease of native coronary artery without angina pectoris: Secondary | ICD-10-CM | POA: Diagnosis not present

## 2023-09-30 DIAGNOSIS — J9602 Acute respiratory failure with hypercapnia: Secondary | ICD-10-CM | POA: Diagnosis not present

## 2023-09-30 DIAGNOSIS — K219 Gastro-esophageal reflux disease without esophagitis: Secondary | ICD-10-CM | POA: Diagnosis present

## 2023-09-30 DIAGNOSIS — I255 Ischemic cardiomyopathy: Secondary | ICD-10-CM | POA: Diagnosis present

## 2023-09-30 DIAGNOSIS — Z7901 Long term (current) use of anticoagulants: Secondary | ICD-10-CM

## 2023-09-30 DIAGNOSIS — J9601 Acute respiratory failure with hypoxia: Secondary | ICD-10-CM | POA: Diagnosis not present

## 2023-09-30 DIAGNOSIS — J440 Chronic obstructive pulmonary disease with acute lower respiratory infection: Secondary | ICD-10-CM | POA: Diagnosis present

## 2023-09-30 DIAGNOSIS — Z800 Family history of malignant neoplasm of digestive organs: Secondary | ICD-10-CM

## 2023-09-30 DIAGNOSIS — E875 Hyperkalemia: Secondary | ICD-10-CM | POA: Diagnosis present

## 2023-09-30 DIAGNOSIS — K3532 Acute appendicitis with perforation and localized peritonitis, without abscess: Secondary | ICD-10-CM | POA: Diagnosis present

## 2023-09-30 DIAGNOSIS — Z885 Allergy status to narcotic agent status: Secondary | ICD-10-CM

## 2023-09-30 DIAGNOSIS — I493 Ventricular premature depolarization: Secondary | ICD-10-CM | POA: Diagnosis not present

## 2023-09-30 DIAGNOSIS — K381 Appendicular concretions: Secondary | ICD-10-CM | POA: Diagnosis present

## 2023-09-30 DIAGNOSIS — J9610 Chronic respiratory failure, unspecified whether with hypoxia or hypercapnia: Secondary | ICD-10-CM | POA: Diagnosis present

## 2023-09-30 DIAGNOSIS — J189 Pneumonia, unspecified organism: Secondary | ICD-10-CM | POA: Diagnosis present

## 2023-09-30 DIAGNOSIS — K3580 Unspecified acute appendicitis: Secondary | ICD-10-CM | POA: Diagnosis not present

## 2023-09-30 DIAGNOSIS — Z9981 Dependence on supplemental oxygen: Secondary | ICD-10-CM

## 2023-09-30 DIAGNOSIS — T4275XA Adverse effect of unspecified antiepileptic and sedative-hypnotic drugs, initial encounter: Secondary | ICD-10-CM | POA: Diagnosis present

## 2023-09-30 DIAGNOSIS — Z7982 Long term (current) use of aspirin: Secondary | ICD-10-CM

## 2023-09-30 DIAGNOSIS — Z841 Family history of disorders of kidney and ureter: Secondary | ICD-10-CM

## 2023-09-30 DIAGNOSIS — A419 Sepsis, unspecified organism: Principal | ICD-10-CM | POA: Diagnosis present

## 2023-09-30 DIAGNOSIS — E861 Hypovolemia: Secondary | ICD-10-CM | POA: Diagnosis present

## 2023-09-30 DIAGNOSIS — Z91199 Patient's noncompliance with other medical treatment and regimen due to unspecified reason: Secondary | ICD-10-CM

## 2023-09-30 LAB — COMPREHENSIVE METABOLIC PANEL
ALT: 20 U/L (ref 0–44)
AST: 20 U/L (ref 15–41)
Albumin: 3.7 g/dL (ref 3.5–5.0)
Alkaline Phosphatase: 91 U/L (ref 38–126)
Anion gap: 10 (ref 5–15)
BUN: 43 mg/dL — ABNORMAL HIGH (ref 8–23)
CO2: 19 mmol/L — ABNORMAL LOW (ref 22–32)
Calcium: 9.8 mg/dL (ref 8.9–10.3)
Chloride: 106 mmol/L (ref 98–111)
Creatinine, Ser: 2.69 mg/dL — ABNORMAL HIGH (ref 0.61–1.24)
GFR, Estimated: 24 mL/min — ABNORMAL LOW (ref >60)
Glucose, Bld: 92 mg/dL (ref 70–99)
Potassium: 4.4 mmol/L (ref 3.5–5.1)
Sodium: 135 mmol/L (ref 135–145)
Total Bilirubin: 1 mg/dL (ref 0.0–1.2)
Total Protein: 6.9 g/dL (ref 6.5–8.1)

## 2023-09-30 LAB — CBC WITH DIFFERENTIAL/PLATELET
Abs Immature Granulocytes: 0.03 10*3/uL (ref 0.00–0.07)
Basophils Absolute: 0 10*3/uL (ref 0.0–0.1)
Basophils Relative: 0 %
Eosinophils Absolute: 0 10*3/uL (ref 0.0–0.5)
Eosinophils Relative: 0 %
HCT: 47.8 % (ref 39.0–52.0)
Hemoglobin: 14.9 g/dL (ref 13.0–17.0)
Immature Granulocytes: 0 %
Lymphocytes Relative: 6 %
Lymphs Abs: 0.6 10*3/uL — ABNORMAL LOW (ref 0.7–4.0)
MCH: 28.7 pg (ref 26.0–34.0)
MCHC: 31.2 g/dL (ref 30.0–36.0)
MCV: 92.1 fL (ref 80.0–100.0)
Monocytes Absolute: 1 10*3/uL (ref 0.1–1.0)
Monocytes Relative: 10 %
Neutro Abs: 8.5 10*3/uL — ABNORMAL HIGH (ref 1.7–7.7)
Neutrophils Relative %: 84 %
Platelets: 186 10*3/uL (ref 150–400)
RBC: 5.19 MIL/uL (ref 4.22–5.81)
RDW: 14.6 % (ref 11.5–15.5)
WBC: 10.1 10*3/uL (ref 4.0–10.5)
nRBC: 0 % (ref 0.0–0.2)

## 2023-09-30 LAB — CBG MONITORING, ED
Glucose-Capillary: 77 mg/dL (ref 70–99)
Glucose-Capillary: 71 mg/dL (ref 70–99)

## 2023-09-30 LAB — HEMOGLOBIN A1C
Hgb A1c MFr Bld: 5.6 % (ref 4.8–5.6)
Mean Plasma Glucose: 114.02 mg/dL

## 2023-09-30 LAB — LACTIC ACID, PLASMA
Lactic Acid, Venous: 1.4 mmol/L (ref 0.5–1.9)
Lactic Acid, Venous: 1.4 mmol/L (ref 0.5–1.9)

## 2023-09-30 LAB — GLUCOSE, CAPILLARY: Glucose-Capillary: 76 mg/dL (ref 70–99)

## 2023-09-30 LAB — LIPASE, BLOOD: Lipase: 29 U/L (ref 11–51)

## 2023-09-30 MED ORDER — PIPERACILLIN-TAZOBACTAM 3.375 G IVPB 30 MIN
3.3750 g | Freq: Once | INTRAVENOUS | Status: AC
  Administered 2023-09-30: 3.375 g via INTRAVENOUS
  Filled 2023-09-30: qty 50

## 2023-09-30 MED ORDER — INSULIN ASPART 100 UNIT/ML IJ SOLN: 0.0000 [IU] | Freq: Three times a day (TID) | INTRAMUSCULAR | Status: DC

## 2023-09-30 MED ORDER — MOMETASONE FURO-FORMOTEROL FUM 200-5 MCG/ACT IN AERO
2.0000 | INHALATION_SPRAY | Freq: Two times a day (BID) | RESPIRATORY_TRACT | Status: DC
  Administered 2023-10-01: 2 via RESPIRATORY_TRACT
  Filled 2023-09-30: qty 8.8

## 2023-09-30 MED ORDER — MORPHINE SULFATE (PF) 2 MG/ML IV SOLN
4.0000 mg | Freq: Once | INTRAVENOUS | Status: AC
Start: 2023-09-30 — End: 2023-09-30
  Administered 2023-09-30: 4 mg via INTRAVENOUS
  Filled 2023-09-30: qty 2

## 2023-09-30 MED ORDER — HEPARIN SODIUM (PORCINE) 5000 UNIT/ML IJ SOLN
5000.0000 [IU] | Freq: Three times a day (TID) | INTRAMUSCULAR | Status: DC
  Administered 2023-09-30 – 2023-10-06 (×17): 5000 [IU] via SUBCUTANEOUS
  Filled 2023-09-30 (×16): qty 1

## 2023-09-30 MED ORDER — ONDANSETRON HCL 4 MG/2ML IJ SOLN
4.0000 mg | Freq: Once | INTRAMUSCULAR | Status: AC
  Administered 2023-09-30: 4 mg via INTRAVENOUS
  Filled 2023-09-30: qty 2

## 2023-09-30 MED ORDER — PIPERACILLIN-TAZOBACTAM 3.375 G IVPB
3.3750 g | Freq: Three times a day (TID) | INTRAVENOUS | Status: DC
  Administered 2023-09-30 – 2023-10-02 (×5): 3.375 g via INTRAVENOUS
  Filled 2023-09-30 (×5): qty 50

## 2023-09-30 MED ORDER — NICOTINE 21 MG/24HR TD PT24
21.0000 mg | MEDICATED_PATCH | Freq: Every day | TRANSDERMAL | Status: DC
  Administered 2023-09-30: 21 mg via TRANSDERMAL
  Filled 2023-09-30: qty 1

## 2023-09-30 MED ORDER — HYDROMORPHONE HCL 1 MG/ML IJ SOLN
1.0000 mg | INTRAMUSCULAR | Status: DC | PRN
  Administered 2023-09-30: 1 mg via INTRAVENOUS
  Filled 2023-09-30: qty 1

## 2023-09-30 MED ORDER — UMECLIDINIUM BROMIDE 62.5 MCG/ACT IN AEPB
1.0000 | INHALATION_SPRAY | Freq: Every day | RESPIRATORY_TRACT | Status: DC
  Filled 2023-09-30: qty 7

## 2023-09-30 MED ORDER — SODIUM CHLORIDE 0.9 % IV BOLUS
500.0000 mL | Freq: Once | INTRAVENOUS | Status: AC
  Administered 2023-09-30: 500 mL via INTRAVENOUS

## 2023-09-30 MED ORDER — BUDESON-GLYCOPYRROL-FORMOTEROL 160-9-4.8 MCG/ACT IN AERO: 2.0000 | INHALATION_SPRAY | Freq: Two times a day (BID) | RESPIRATORY_TRACT | Status: DC

## 2023-09-30 NOTE — ED Provider Notes (Signed)
[place] EMERGENCY DEPARTMENT AT [facility] Provider Note   CSN: [id number] Arrival date & time: 09/30/23  0211     History  Chief Complaint  Patient presents with   Abdominal Pain    Bobby [name] is a 78 y.o. male.  This is a 78 year old male presenting emergency department for lower abdominal pain.  Started abruptly around noon yesterday had some nausea, but no vomiting.  No prior surgeries.  Pain described as sharp, constant.    Abdominal Pain      Home Medications Prior to Admission medications   Medication Sig Start Date End Date Taking? Authorizing Provider  acetaminophen (TYLENOL) 325 MG tablet Take 2 tablets (650 mg total) by mouth every 4 (four) hours as needed for headache or mild pain. 03/12/22   Graciella Freer, PA-C  albuterol (PROVENTIL HFA;VENTOLIN HFA) 108 (90 Base) MCG/ACT inhaler Inhale 2 puffs into the lungs every 6 (six) hours as needed for wheezing or shortness of breath. 08/21/17   Manson Passey, PA  amLODipine (NORVASC) 5 MG tablet TAKE ONE TABLET BY MOUTH DAILY 07/19/22   Lennette Bihari, MD  apixaban (ELIQUIS) 5 MG TABS tablet Take 1 tablet (5 mg total) by mouth 2 (two) times daily. 03/15/22   Graciella Freer, PA-C  aspirin EC 81 MG tablet Take 1 tablet (81 mg total) by mouth daily. Swallow whole. 03/15/22   Graciella Freer, PA-C  atorvastatin (LIPITOR) 80 MG tablet Take 1 tablet (80 mg total) by mouth daily at 6 PM. 03/11/18   Lennette Bihari, MD  BREZTRI AEROSPHERE 160-9-4.8 MCG/ACT AERO Inhale 2 puffs into the lungs 2 (two) times daily. 01/06/20   [provider]  buPROPion (WELLBUTRIN XL) 300 MG 24 hr tablet Take 300 mg by mouth daily.    [provider]  carvedilol (COREG) 25 MG tablet TAKE ONE TABLET BY MOUTH TWICE DAILY 06/13/23   Lennette Bihari, MD  clonazePAM (KLONOPIN) 1 MG tablet Take 1 mg by mouth 2 (two) times daily as needed for anxiety. 02/10/22   [provider]  Coenzyme  Q10 (COQ10 PO) Take 1 capsule by mouth daily.    [provider]  COLCRYS 0.6 MG tablet Take 0.6 mg by mouth daily. 03/21/17   [provider]  empagliflozin (JARDIANCE) 10 MG TABS tablet Take 1 tablet (10 mg total) by mouth daily before breakfast. 08/08/23   Sabharwal, Aditya, DO  ferrous sulfate 325 (65 FE) MG EC tablet Take 325 mg by mouth 2 (two) times a week.    [provider]  furosemide (LASIX) 20 MG tablet Take 1 tablet (20 mg total) by mouth daily. 08/08/23   Sabharwal, Aditya, DO  hydrALAZINE (APRESOLINE) 50 MG tablet Take 1 tablet (50 mg total) by mouth every 8 (eight) hours. 03/12/22   Graciella Freer, PA-C  isosorbide mononitrate (IMDUR) 30 MG 24 hr tablet Take 1 tablet (30 mg total) by mouth daily. 03/12/22   Graciella Freer, PA-C  nitroGLYCERIN (NITROSTAT) 0.4 MG SL tablet DISSOLVE 1 TABLET UNDER THE TONGUE EVERY 5 MINUTES AS NEEDED FOR CHEST PAIN. DO NOT EXCEED A TOTAL OF 3 DOSES IN 15 MINUTES. 07/11/23   Lennette Bihari, MD  Omega-3 Fatty Acids (FISH OIL PO) Take 1 tablet by mouth daily.    [provider]  oxyCODONE-acetaminophen (PERCOCET) 10-325 MG per tablet Take 1 tablet by mouth every 6 (six) hours as needed for pain.    [provider]  predniSONE (DELTASONE)  10 MG tablet Take 10 mg by mouth daily.    [provider]  sertraline (ZOLOFT) 100 MG tablet Take 100 mg by mouth daily.    [provider]  Dwyane Luo 200-62.5-25 MCG/ACT AEPB Inhale 1 puff into the lungs daily. 04/02/22   [provider]  Vitamin D, Ergocalciferol, (DRISDOL) 1.25 MG (50000 UNIT) CAPS capsule Take 50,000 Units by mouth 2 (two) times a week. 01/06/22   [provider]      Allergies    Hydrocodone    Review of Systems   Review of Systems  Gastrointestinal:  Positive for abdominal pain.    Physical Exam Updated Vital Signs BP 93/64   Pulse 78   Temp 97.6 F (36.4 C)   Resp 19   SpO2 98%  Physical  Exam Vitals and nursing note reviewed.  Constitutional:      Appearance: He is obese.  HENT:     Head: Normocephalic and atraumatic.  Cardiovascular:     Rate and Rhythm: Normal rate and regular rhythm.  Pulmonary:     Effort: Pulmonary effort is normal.     Breath sounds: Normal breath sounds.  Abdominal:     General: Abdomen is flat.     Palpations: Abdomen is soft.     Tenderness: There is abdominal tenderness in the right lower quadrant and suprapubic area.  Skin:    General: Skin is warm.  Neurological:     Mental Status: He is alert and oriented to person, place, and time.  Psychiatric:        Mood and Affect: Mood normal.        Behavior: Behavior normal.     ED Results / Procedures / Treatments   Labs (all labs ordered are listed, but only abnormal results are displayed) Labs Reviewed  CBC WITH DIFFERENTIAL/PLATELET - Abnormal; Notable for the following components:      Result Value   Neutro Abs 8.5 (*)    Lymphs Abs 0.6 (*)    All other components within normal limits  COMPREHENSIVE METABOLIC PANEL - Abnormal; Notable for the following components:   CO2 19 (*)    BUN 43 (*)    Creatinine, Ser 2.69 (*)    GFR, Estimated 24 (*)    All other components within normal limits  CULTURE, BLOOD (ROUTINE X 2)  CULTURE, BLOOD (ROUTINE X 2)  LIPASE, BLOOD  URINALYSIS, ROUTINE W REFLEX MICROSCOPIC  LACTIC ACID, PLASMA  LACTIC ACID, PLASMA    EKG EKG Interpretation Date/Time:  Monday September 30 2023 08:45:53 EST Ventricular Rate:  78 PR Interval:  212 QRS Duration:  164 QT Interval:  422 QTC Calculation: 481 R Axis:   263  Text Interpretation: Incomplete analysis due to missing data in precordial lead(s) Sinus rhythm Borderline prolonged PR interval RBBB and LAFB Missing lead(s): V2 Confirmed by Estanislado Pandy [phone]) on 09/30/2023 8:58:00 AM  Radiology CT ABDOMEN PELVIS WO CONTRAST Result Date: 09/30/2023 CLINICAL DATA:  78 year old male with abdominal pain.  EXAM: CT ABDOMEN AND PELVIS WITHOUT CONTRAST TECHNIQUE: Multidetector CT imaging of the abdomen and pelvis was performed following the standard protocol without IV contrast. RADIATION DOSE REDUCTION: This exam was performed according to the departmental dose-optimization program which includes automated exposure control, adjustment of the mA and/or kV according to patient size and/or use of iterative reconstruction technique. COMPARISON:  Chest CT 08/30/2023. FINDINGS: Lower chest: Stable cardiomegaly, partially visible cardiac pacemaker. No pericardial effusion. Patchy and confluent new bibasilar lung opacity most  resembles atelectasis affecting the lingula and both costophrenic angles. No pleural effusion. Hepatobiliary: Negative noncontrast liver and gallbladder. Pancreas: Negative. Spleen: Negative. Adrenals/Urinary Tract: Bilateral adrenal gland thickening compatible with hyperplasia. Nonobstructed kidneys with multiple bilateral renal cysts, the largest have simple fluid density (no follow-up imaging recommended). No nephrolithiasis. Extensive but symmetric and nonspecific bilateral pararenal space fat stranding as seen on coronal image 60. Ureters are decompressed to the bladder. Decompressed and unremarkable urinary bladder. Stomach/Bowel: Decompressed and unremarkable rectum. Thick-walled sigmoid colon at the right pelvic inlet along a segment of about 7 cm (series 3, image 70). Wall thickening continues proximally but is less pronounced. Superimposed diverticulosis in the more proximal sigmoid colon (series 3, image 75). Retained stool in the descending colon which has a more normal appearance. Retained gas and stool in the transverse colon which appears negative. Ascending colon is decompressed. Right lower quadrant inflammation at the tip of the cecum (series 3, image 71) is surrounding and dependent adjacent to abnormal appendix. Appendix: Location: Medial to the tip of the cecum coronal image 77  Diameter: 14 mm Appendicolith: Positive (image 77) Mucosal hyper-enhancement: Noncontrast exam. Extraluminal gas: Negative. Periappendiceal collection: Regional inflammation and perhaps trace free fluid (series 3, image 68). Small bowel loops in the lower abdomen are widely irregular but decompressed. Retained fluid in the stomach. Decompressed duodenum. No free intraperitoneal air. No dilated small bowel. Small volume of free fluid in both gutters. Vascular/Lymphatic: Aortoiliac calcified atherosclerosis. Normal caliber abdominal aorta. Vascular patency is not evaluated in the absence of IV contrast. No lymphadenopathy identified. Reproductive: Penile implant. Other: No pelvis free fluid. Musculoskeletal: Advanced lower thoracic disc and endplate degeneration. No acute osseous abnormality identified. IMPRESSION: 1. Acute Appendicitis with appendicolith. No pneumoperitoneum to indicate perforation, and only a small volume of free fluid, but appearance of Widespread Peritonitis with extensive small bowel and multiple large bowel segments appearing actively inflamed. 2. No other acute finding; Lung base atelectasis. Nonspecific bilateral pararenal space inflammation without obstructive uropathy. Aortic Atherosclerosis (ICD10-I70.0). Electronically Signed   By: Odessa Fleming M.D.   On: 09/30/2023 05:58    Procedures Procedures    Medications Ordered in ED Medications  piperacillin-tazobactam (ZOSYN) IVPB 3.375 g (3.375 g Intravenous New Bag/Given 09/30/23 0840)  morphine (PF) 2 MG/ML injection 4 mg (4 mg Intravenous Given 09/30/23 0236)  ondansetron (ZOFRAN) injection 4 mg (4 mg Intravenous Given 09/30/23 0247)  sodium chloride 0.9 % bolus 500 mL (500 mLs Intravenous New Bag/Given 09/30/23 4098)    ED Course/ Medical Decision Making/ A&P Clinical Course as of 09/30/23 0901  Mon Sep 30, 2023  0731 CT ABDOMEN PELVIS WO CONTRAST IMPRESSION: 1. Acute Appendicitis with appendicolith. No pneumoperitoneum to  indicate perforation, and only a small volume of free fluid, but appearance of Widespread Peritonitis with extensive small bowel and multiple large bowel segments appearing actively inflamed.  2. No other acute finding; Lung base atelectasis. Nonspecific bilateral pararenal space inflammation without obstructive uropathy. Aortic Atherosclerosis (ICD10-I70.0).   [TY]  [id number] Per chart review Echo 10/17/22: "Moderate LV dysfunction with EF 35 to 40% with prior apical aneurysm.  Mild LVH.  Moderate LA dilatation." [TY]  0734 CBC with Differential(!) No leukocytosis [TY]  0735 Lipase: 29 [TY]  0735 Comprehensive metabolic panel(!) Baseline renal function. [TY]  0735 Lipase: 29 Pancreatitis unlikely [TY]  0736 Patient with RLQ abdominal pain. Soft BP, but no tachycardia, and satting well on home oxygen. H/o of CHF with reduced EF, CAD, COPD on 4L, Pulm HTN, Morbid Obesity, CVA  on eliquis, DM, HTN, HLD, Labs as noted in ED course. CT scan with acute appendicitis. Lactic and blood cultures ordered as well as small fluid bolus given CHF history and antibiotics. General surgery consulted for appendectomy.  [TY]  0800 Spoke with general surgery PA on-call, given patient's age and comorbid medical conditions with being on Eliquis is requesting medicine admit for medical optimization and cardiac clearance. [TY]  [id number] Case discussed with internal medicine.  Will admit patient. [TY]  0901 EKG 12-Lead [TY]    Clinical Course User Index [TY] Coral Spikes, DO                                 Medical Decision Making See ED course  Amount and/or Complexity of Data Reviewed Independent Historian:     Details: None External Data Reviewed:     Details: See ED course Labs: ordered. Decision-making details documented in ED Course. Radiology:  Decision-making details documented in ED Course. ECG/medicine tests: ordered and independent interpretation performed. Decision-making details documented in ED  Course.    Details: EKG on my independent interpretation appears to be normal sinus rhythm at a rate of 78 bpm.  Normal intervals.  Right bundle branch block pattern.  Is a little missing the lead 2.  However I do not see any ST segment changes otherwise that would indicate ischemia. Discussion of management or test interpretation with external provider(s): Surgery  Risk Prescription drug management. Decision regarding hospitalization.         Final Clinical Impression(s) / ED Diagnoses Final diagnoses:  Acute appendicitis with localized peritonitis, without perforation, abscess, or gangrene    Rx / DC Orders ED Discharge Orders     None         Coral Spikes, DO 09/30/23 0901

## 2023-09-30 NOTE — Progress Notes (Signed)
ED Pharmacy Antibiotic Sign Off An antibiotic consult was received from an ED provider for zosyn per pharmacy dosing for intra-abdominal infection. A chart review was completed to assess appropriateness.   The following one time order(s) were placed:  Zosyn 3.375g over 30 minutes   Further antibiotic and/or antibiotic pharmacy consults should be ordered by the admitting provider if indicated.   Thank you for allowing pharmacy to be a part of this patient's care.   Marja Kays, [facility]  Clinical Pharmacist 09/30/23 7:42 AM

## 2023-09-30 NOTE — H&P (Signed)
Date: 09/30/2023               Patient Name:  Bobby [name] MRN: [id number]  DOB: [birth year] Age / Sex: 78 y.o., male   PCP: Dois Davenport, MD         Medical Service: Internal Medicine Teaching Service         Attending Physician: Dr. Reymundo Poll, MD    First Contact: Dr. Monna Fam Pager: [phone]  Second Contact: Dr. Rana Snare Pager: [phone]       After Hours (After 5p/  First Contact Pager: [phone]  weekends / holidays): Second Contact Pager: [phone]   Chief Complaint: abdominal pain  History of Present Illness: Bobby [name] is a 78 y.o. M with PMH HTN, CVA, CAD, STEMI 2013 and 2018 s/p PCI, HLD, HFrEF, CKD stage 4, OSA, COPD, high-degree AV block s/p CRT-D, who presented with abdominal pain for 1 day admitted with acute appendicitis.  He states that yesterday around lunch he had a sandwich and experience severe abdominal pain afterwards. The pain was diffuse but worse over the RLQ. Prior to this he has been eating and drinking well, has had no nausea or vomiting, no fever (he "stays cold").   Labs overall unremarkable. Imaging notable for acute appendicitis with peritonitis and diffuse inflammation of the small and large bowel. Blood cultures and lactic acid collected and he was started on zosyn in the ED. General surgery was consulted and IMTS was paged for admission.  Past Medical History: Past Medical History:  Diagnosis Date   DM (diabetes mellitus), type 2 new diagnosis 08/05/2012   diet control   ED (erectile dysfunction)    surgery planned   Hyperlipidemia 08/05/2012   Hypertensive heart disease    Panic attack    Pulmonary HTN (HCC)    echo 08/05/12, EF 55-60%, PA pressure 45mm   S/P coronary artery stent placement, to RCA Promus DES 08/05/2012   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   Tobacco abuse    Past Surgical History: Past Surgical History:  Procedure Laterality Date   BIV ICD INSERTION CRT-D N/A 03/11/2022   Procedure: BIV  ICD INSERTION CRT-D;  Surgeon: Regan Lemming, MD;  Location: [facility] INVASIVE CV LAB;  Service: Cardiovascular;  Laterality: N/A;   CARDIAC CATHETERIZATION  08/04/12   PCI to RCA with DES   CORONARY STENT INTERVENTION N/A 08/12/2017   Procedure: CORONARY STENT INTERVENTION;  Surgeon: Swaz[place], Peter M, MD;  Location: [facility] INVASIVE CV LAB;  Service: Cardiovascular;  Laterality: N/A;   CORONARY/GRAFT ACUTE MI REVASCULARIZATION N/A 08/09/2017   Procedure: Coronary/Graft Acute MI Revascularization;  Surgeon: Lyn Records, MD;  Location: [facility] INVASIVE CV LAB;  Service: Cardiovascular;  Laterality: N/A;   LEFT HEART CATH AND CORONARY ANGIOGRAPHY N/A 08/09/2017   Procedure: LEFT HEART CATH AND CORONARY ANGIOGRAPHY;  Surgeon: Lyn Records, MD;  Location: [facility] INVASIVE CV LAB;  Service: Cardiovascular;  Laterality: N/A;   LEFT HEART CATHETERIZATION WITH CORONARY ANGIOGRAM N/A 08/04/2012   Procedure: LEFT HEART CATHETERIZATION WITH CORONARY ANGIOGRAM;  Surgeon: Lennette Bihari, MD;  Location: [facility] CATH LAB;  Service: Cardiovascular;  Laterality: N/A;   LEG SURGERY Left    rod placed for fracture repair   PENILE PROSTHESIS IMPLANT N/A 06/03/2014   Procedure: IMPLANT PENILE PROTHESIS INFLATABLE;  Surgeon: Kathi Ludwig, MD;  Location: [facility] ORS;  Service: Urology;  Laterality: N/A;  with penile block--0.5% marcaine plain   PERCUTANEOUS CORONARY STENT  INTERVENTION (PCI-S) Right 08/04/2012   Procedure: PERCUTANEOUS CORONARY STENT INTERVENTION (PCI-S);  Surgeon: Lennette Bihari, MD;  Location: [facility] CATH LAB;  Service: Cardiovascular;  Laterality: Right;   TEMPORARY PACEMAKER N/A 03/08/2022   Procedure: TEMPORARY PACEMAKER;  Surgeon: Lyn Records, MD;  Location: [facility] INVASIVE CV LAB;  Service: Cardiovascular;  Laterality: N/A;   THUMB ARTHROSCOPY Left    thumb joint replaced   Meds: Pending verification. No outpatient medications have been marked as taking for the 09/30/23 encounter [facility] Encounter).    Allergies: Allergies as of 09/30/2023 - Review Complete 09/30/2023  Allergen Reaction Noted   Hydrocodone Itching 05/24/2014   Family History:  Family History  Problem Relation Age of Onset   Kidney disease Father        kidney disease and heart disease   Heart disease Maternal Grandmother    Stomach cancer Maternal Grandfather    Heart attack Paternal Grandmother    Social History: Independent in ADLs and IADLs. Denies alcohol use, smokes 1 pack per day or less, and denies illicit substance use.  Review of Systems: A complete ROS was negative except as per HPI.   Physical Exam: Blood pressure (!) 112/58, pulse 82, temperature 97.6 F (36.4 C), resp. rate (!) 24, SpO2 96%. Physical Exam Constitutional:      Comments: Intermittently alert, dozes off to sleep during conversation. No acute distress.  Cardiovascular:     Rate and Rhythm: Normal rate and regular rhythm.     Comments: Unable to assess for JVD given body habitus. Pulmonary:     Effort: Pulmonary effort is normal.     Breath sounds: Wheezing (Mild, diffuse) present.     Comments: On 4L [place] saturation 94%. Abdominal:     General: There is distension.     Tenderness: There is generalized abdominal tenderness and tenderness in the right lower quadrant. There is no guarding or rebound.  Skin:    General: Skin is warm and dry.    EKG: Pending.  CT abdomen/pelvis: 1. Acute Appendicitis with appendicolith. No pneumoperitoneum to indicate perforation, and only a small volume of free fluid, but appearance of Widespread Peritonitis with extensive small bowel and multiple large bowel segments appearing actively inflamed. 2. No other acute finding; Lung base atelectasis. Nonspecific bilateral pararenal space inflammation without obstructive uropathy. 3. Aortic Atherosclerosis (ICD10-I70.0).  Assessment & Plan by Problem: Principal Problem:   Appendicitis  Acute appendicitis Hemodynamically stable without  leukocytosis. On exam, he has diffuse tenderness to palpation with increased tenderness over RLQ. He is hemodynamically stable, fortunately, but has several comorbid conditions that place him at increased surgical risk and will need medical optimization. Per my calculations, RCRI score is 5. S/p initiation of piperacillin-tazobactam in ED. Plan: -General surgery consulted, appreciate their recommendations  -Probable laparoscopic appendectomy pending eliquis washout  -Continue piperacillin-tazobactam per pharmacy  -Requesting cardiology consult prior to surgery given significant comorbidities  -Treatment plans to optimize comorbidities as much as we are able as below -F/u blood culture, lactic acid -IV dilaudid 1 mg q4h PRN moderate-severe pain while NPO; plan to transition to PO when able  -Consider renal clearance when selecting pain regimen  COPD on home oxygen OSA on CPAP Tobacco use disorder On home oxygen but does not remember how much. Saturating 94% on 4L [place]; goal 88-92% in this person with COPD. Smokes 1 pack per day or less at this time. 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 Plan: -Cardiology consulted, appreciate their recommendations -Holding home jardiance 10 mg daily, lasix 20 mg daily while NPO and mildly hypotensive, hypovolemic  HLD Hx CVA Hx STEMI 2013, 2018 s/p PCI to RCA, LAD, and mid-LCX and OM1 Hx high-degree AV block s/p CRT-D Mildly hypovolemic on exam. He is in sinus rhythm.  Plan: -Hold home atorvastatin 80 mg daily, omega-3 fatty acids 1 tablet daily while NPO -Hold home eliquis 5 mg BID wihle NPO and in anticipation of surgery -Hold home ASA 81 mg daily while NPO  HTN Mildly hypotensive since arrival, hypovolemic on exam. S/p 500  cc NS.  Plan: -Will hold home amlodipine 5 mg daily, carvedilol 25 mg BID, hydralazine 50 mg TID, isosorbide mononitrate 30 mg daily at this time  Type 2 diabetes Last HbA1c 5.5% two years ago per chart review. Is not on insulin OP. Plan: -CBG TID with meals, SSI TID with meals  CKD stage 4 Renal function stable in CKD stage 4 range. Plan: -Trend BMP -Avoid nephrotoxic agents as able  Anxiety Depression Plan: -Hold home sertraline 100 mg daily, clonazepam 1 mg BID PRN anxiety, bupropion 300 mg daily while NPO  Chronic back pain Per chart review, is on disability for this. Fills oxycodone-acetaminophen 10-325 mg monthly. Dosing is for q6h PRN. He has an allergy of itching listed to hydrocodone but tolerates oxycodone as justmentioned. Plan: -IV dilaudid 1 mg q4h PRN moderate-severe pain while NPO; plan to transition to PO when able  Medication reconciliation Please attempt to contact PCP office for updated medication list. An attempt has been made and the fax did not come through. He is unable to provide this information on exam.  Dispo: Admit patient to Inpatient with expected length of stay greater than 2 midnights.  SignedChamp Mungo, DO 09/30/2023, 9:52 AM  After 5pm on weekdays and 1pm on weekends: On Call pager: [phone].

## 2023-09-30 NOTE — Hospital Course (Addendum)
Orders still needed: -breathing treatments

## 2023-09-30 NOTE — ED Provider Triage Note (Signed)
Emergency Medicine Provider Triage Evaluation Note  Bobby [name] , a 78 y.o. male  was evaluated in triage.  Pt complains of right lower quadrant abdominal pain.  Pain is described as sharp, began a few hours ago after eating Bobby [name].  He denies any previous abdominal surgeries.  Denies nausea, vomiting, diarrhea.  Review of Systems  Positive:  Negative:   Physical Exam  BP (!) 113/59 (BP Location: Right Arm)   Pulse 78   Temp 97.7 F (36.5 C) (Oral)   Resp (!) 22   SpO2 98%  Gen:   Awake, no distress   Resp:  Normal effort  MSK:   Moves extremities without difficulty  Other:    Medical Decision Making  Medically screening exam initiated at 2:20 AM.  Appropriate orders placed.  ATARI NOVICK was informed that the remainder of the evaluation will be completed by another provider, this initial triage assessment does not replace that evaluation, and the importance of remaining in the ED until their evaluation is complete.     Darrick Grinder, [place] 09/30/23 [phone]

## 2023-09-30 NOTE — Progress Notes (Signed)
Pt arrived to floor around 1700.  Pt able to pull himself over to the bed.   Pt has been very somnolent otherwise, will awaken to verbal stimuli but doze off within seconds.  When orientation questions were asked he is able to answer correctly but towards the end of his sentence, gets difficult to understand as his doses off before finishing his statement.  Pt sat'n 88%-92% on 5L [place].  Placed on cont. Pulse ox.  Placed on telemetry.  SCDs placed.  Unable to complete admission questions as patient in too somnolent to answer.   MD paged, Carmina Miller.

## 2023-09-30 NOTE — Progress Notes (Signed)
   09/30/23 2209  BiPAP/CPAP/SIPAP  $ Non-Invasive Home Ventilator  Initial  $ Face Mask Large  Yes  BiPAP/CPAP/SIPAP Pt Type Adult  BiPAP/CPAP/SIPAP Resmed  Mask Type Nasal mask  Set Rate 18 breaths/min  EPAP  (6-18)  Flow Rate 4 lpm  Patient Home Equipment No  Auto Titrate No  BiPAP/CPAP /SiPAP Vitals  Pulse Rate 87  Resp 18  SpO2 93 %  MEWS Score/Color  MEWS Score 0  MEWS Score Color [place] Si

## 2023-09-30 NOTE — Consult Note (Addendum)
Bobby [name] 03-20-1946  [id number].    Requesting MD: Dr. Estanislado Pandy Chief Complaint/Reason for Consult: Acute Appendicitis  HPI: Bobby [name] is a 78 y.o. who presented the emergency department for abdominal pain.  Patient reports that yesterday he began having RLQ abdominal pain with some nausea.  Denies fever, vomiting, constipation, diarrhea or urinary symptoms.  No history of similar symptoms in the past.  Nothing seems to make this better or worse.  His current pain level is 8/10. Workup c/f acute appendicitis. We were asked to see.   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 I cannot see these results.   Lives at home alone. Walks independently at baseline. Reports he cannot walk up 1 flight of stairs at baseline without sob (on 4L at baseline).   ROS: ROS As above, see hpi  Family History  Problem Relation Age of Onset   Kidney disease Father        kidney disease and heart disease   Heart disease Maternal Grandmother    Stomach cancer Maternal Grandfather    Heart attack Paternal Grandmother     Past Medical History:  Diagnosis Date   DM (diabetes mellitus), type 2 new diagnosis 08/05/2012   diet control   ED (erectile dysfunction)    surgery planned   Hyperlipidemia 08/05/2012   Hypertensive heart disease    Panic attack    Pulmonary HTN (HCC)    echo 08/05/12, EF 55-60%, PA pressure 45mm   S/P coronary artery stent placement, to RCA Promus DES 08/05/2012   Stroke (HCC)     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   Tobacco abuse     Past Surgical History:  Procedure Laterality Date   BIV ICD INSERTION CRT-D N/A 03/11/2022   Procedure: BIV ICD INSERTION CRT-D;  Surgeon: Regan Lemming, MD;  Location: [facility] INVASIVE CV LAB;  Service: Cardiovascular;  Laterality: N/A;   CARDIAC CATHETERIZATION  08/04/12   PCI to RCA with DES   CORONARY STENT INTERVENTION N/A 08/12/2017   Procedure: CORONARY STENT INTERVENTION;  Surgeon: Swaziland, Peter M, MD;  Location: [facility] INVASIVE CV LAB;  Service: Cardiovascular;  Laterality: N/A;   CORONARY/GRAFT ACUTE MI REVASCULARIZATION N/A 08/09/2017   Procedure: Coronary/Graft Acute MI Revascularization;  Surgeon: Lyn Records, MD;  Location: [facility] INVASIVE CV LAB;  Service: Cardiovascular;  Laterality: N/A;   LEFT HEART CATH AND CORONARY ANGIOGRAPHY N/A 08/09/2017   Procedure: LEFT HEART CATH AND CORONARY ANGIOGRAPHY;  Surgeon: Lyn Records, MD;  Location: [facility] INVASIVE CV LAB;  Service: Cardiovascular;  Laterality: N/A;   LEFT HEART CATHETERIZATION WITH CORONARY ANGIOGRAM N/A 08/04/2012   Procedure: LEFT HEART CATHETERIZATION WITH CORONARY ANGIOGRAM;  Surgeon: Lennette Bihari, MD;  Location: [facility] CATH LAB;  Service: Cardiovascular;  Laterality: N/A;   LEG SURGERY Left    rod placed for fracture repair   PENILE PROSTHESIS IMPLANT N/A 06/03/2014   Procedure: IMPLANT PENILE PROTHESIS INFLATABLE;  Surgeon: Kathi Ludwig, MD;  Location: [facility] ORS;  Service: Urology;  Laterality:  N/A;  with penile block--0.5% marcaine plain   PERCUTANEOUS CORONARY STENT INTERVENTION (PCI-S) Right 08/04/2012   Procedure: PERCUTANEOUS CORONARY STENT INTERVENTION (PCI-S);  Surgeon: Lennette Bihari, MD;  Location: [facility] CATH LAB;  Service: Cardiovascular;  Laterality: Right;   TEMPORARY PACEMAKER N/A 03/08/2022   Procedure: TEMPORARY PACEMAKER;  Surgeon: Lyn Records, MD;  Location: [facility] INVASIVE CV LAB;  Service: Cardiovascular;  Laterality: N/A;   THUMB ARTHROSCOPY Left    thumb  joint replaced    Social History:  reports that he has been smoking cigarettes. He has a 15 pack-year smoking history. He has never used smokeless tobacco. He reports that he does not currently use alcohol. He reports that he does not use drugs.  Allergies:  Allergies  Allergen Reactions   Hydrocodone Itching    (Not in a hospital admission)    Physical Exam: Blood pressure 93/64, pulse 78, temperature 97.6 F (36.4 C), resp. rate 19, SpO2 98%. General: pleasant, WD/WN male who is laying in bed in NAD HEENT: head is normocephalic, atraumatic.  Sclera are non-icteric Heart: regular, rate, and rhythm.   Lungs: CTAB, no wheezes, rhonchi, or rales noted.  Respiratory effort nonlabored Abd:  Soft, obese, suprapubic > RLQ > LLQ ttp without rigidity or guarding. Otherwise NT. +BS. No masses, hernias, or organomegaly MS: no BUE or BLE edema Skin: warm and dry  Psych: A&Ox3 (not orientated to time - thinks it is 2003 or 2023) with an appropriate affect Neuro: normal speech, thought process intact, moves all extremities, gait not assessed   Results for orders placed or performed during the hospital encounter of 09/30/23 (from the past 48 hours)  CBC with Differential     Status: Abnormal   Collection Time: 09/30/23  2:20 AM  Result Value Ref Range   WBC 10.1 4.0 - 10.5 K/uL   RBC 5.19 4.22 - 5.81 MIL/uL   Hemoglobin 14.9 13.0 - 17.0 g/dL   HCT 16.1 09.6 - 04.5 %   MCV 92.1 80.0 - 100.0 fL   MCH 28.7 26.0 - 34.0 pg   MCHC 31.2 30.0 - 36.0 g/dL   RDW 40.9 81.1 - 91.4 %   Platelets 186 150 - 400 K/uL   nRBC 0.0 0.0 - 0.2 %   Neutrophils Relative % 84 %   Neutro Abs 8.5 (H) 1.7 - 7.7 K/uL   Lymphocytes Relative 6 %   Lymphs Abs 0.6 (L) 0.7 - 4.0 K/uL   Monocytes Relative 10 %   Monocytes Absolute 1.0 0.1 - 1.0 K/uL   Eosinophils Relative 0 %   Eosinophils Absolute 0.0 0.0 - 0.5 K/uL   Basophils Relative 0 %   Basophils Absolute 0.0 0.0 - 0.1 K/uL   Immature Granulocytes 0 %    Abs Immature Granulocytes 0.03 0.00 - 0.07 K/uL    Comment: Performed at [facility], [address] [place], [place] [address]  Comprehensive metabolic panel     Status: Abnormal   Collection Time: 09/30/23  2:20 AM  Result Value Ref Range   Sodium 135 135 - 145 mmol/L   Potassium 4.4 3.5 - 5.1 mmol/L   Chloride 106 98 - 111 mmol/L   CO2 19 (L) 22 - 32 mmol/L   Glucose, Bld 92 70 - 99 mg/dL    Comment: Glucose reference range applies only to samples taken after fasting for at least 8 hours.   BUN 43 (H) 8 - 23 mg/dL   Creatinine, Ser 6.21 (H) 0.61 -  1.24 mg/dL   Calcium 9.8 8.9 - 86.5 mg/dL   Total Protein 6.9 6.5 - 8.1 g/dL   Albumin 3.7 3.5 - 5.0 g/dL   AST 20 15 - 41 U/L   ALT 20 0 - 44 U/L   Alkaline Phosphatase 91 38 - 126 U/L   Total Bilirubin 1.0 0.0 - 1.2 mg/dL   GFR, Estimated 24 (L) >60 mL/min    Comment: (NOTE) Calculated using the CKD-EPI Creatinine Equation (2021)    Anion gap 10 5 - 15    Comment: Performed at [facility], [address], [place], [place] [address]  Lipase, blood     Status: None   Collection Time: 09/30/23  2:20 AM  Result Value Ref Range   Lipase 29 11 - 51 U/L    Comment: Performed at [facility], [address], [place], [place] [address]   CT ABDOMEN PELVIS WO CONTRAST Result Date: 09/30/2023 CLINICAL DATA:  78 year old male with abdominal pain. EXAM: CT ABDOMEN AND PELVIS WITHOUT CONTRAST TECHNIQUE: Multidetector CT imaging of the abdomen and pelvis was performed following the standard protocol without IV contrast. RADIATION DOSE REDUCTION: This exam was performed according to the departmental dose-optimization program which includes automated exposure control, adjustment of the mA and/or kV according to patient size and/or use of iterative reconstruction technique. COMPARISON:  Chest CT 08/30/2023. FINDINGS: Lower chest: Stable cardiomegaly, partially visible cardiac pacemaker. No pericardial effusion. Patchy and confluent  new bibasilar lung opacity most resembles atelectasis affecting the lingula and both costophrenic angles. No pleural effusion. Hepatobiliary: Negative noncontrast liver and gallbladder. Pancreas: Negative. Spleen: Negative. Adrenals/Urinary Tract: Bilateral adrenal gland thickening compatible with hyperplasia. Nonobstructed kidneys with multiple bilateral renal cysts, the largest have simple fluid density (no follow-up imaging recommended). No nephrolithiasis. Extensive but symmetric and nonspecific bilateral pararenal space fat stranding as seen on coronal image 60. Ureters are decompressed to the bladder. Decompressed and unremarkable urinary bladder. Stomach/Bowel: Decompressed and unremarkable rectum. Thick-walled sigmoid colon at the right pelvic inlet along a segment of about 7 cm (series 3, image 70). Wall thickening continues proximally but is less pronounced. Superimposed diverticulosis in the more proximal sigmoid colon (series 3, image 75). Retained stool in the descending colon which has a more normal appearance. Retained gas and stool in the transverse colon which appears negative. Ascending colon is decompressed. Right lower quadrant inflammation at the tip of the cecum (series 3, image 71) is surrounding and dependent adjacent to abnormal appendix. Appendix: Location: Medial to the tip of the cecum coronal image 77 Diameter: 14 mm Appendicolith: Positive (image 77) Mucosal hyper-enhancement: Noncontrast exam. Extraluminal gas: Negative. Periappendiceal collection: Regional inflammation and perhaps trace free fluid (series 3, image 68). Small bowel loops in the lower abdomen are widely irregular but decompressed. Retained fluid in the stomach. Decompressed duodenum. No free intraperitoneal air. No dilated small bowel. Small volume of free fluid in both gutters. Vascular/Lymphatic: Aortoiliac calcified atherosclerosis. Normal caliber abdominal aorta. Vascular patency is not evaluated in the absence of  IV contrast. No lymphadenopathy identified. Reproductive: Penile implant. Other: No pelvis free fluid. Musculoskeletal: Advanced lower thoracic disc and endplate degeneration. No acute osseous abnormality identified. IMPRESSION: 1. Acute Appendicitis with appendicolith. No pneumoperitoneum to indicate perforation, and only a small volume of free fluid, but appearance of Widespread Peritonitis with extensive small bowel and multiple large bowel segments appearing actively inflamed. 2. No other acute finding; Lung base atelectasis. Nonspecific bilateral pararenal space inflammation without obstructive uropathy. Aortic Atherosclerosis (ICD10-I70.0).  Electronically Signed   By: Odessa Fleming M.D.   On: 09/30/2023 05:58    Anti-infectives (From admission, onward)    Start     Dose/Rate Route Frequency Ordered Stop   09/30/23 0800  piperacillin-tazobactam (ZOSYN) IVPB 3.375 g        3.375 g 100 mL/hr over 30 Minutes Intravenous  Once 09/30/23 1884         Assessment/Plan Acute appendicitis  This is a 78 year old male who presented with 1 day history of RLQ abdominal pain and nausea.  CT A/P consistent with acute appendicitis without evidence of obvious perforation.  There is a small volume of free fluid.  No obvious abscess.  He is currently afebrile without tachycardia.  RN rechecking BP.  WBC 10.1.  Lactic pending.  No peritonitis on exam.  Workup is consistent with acute appendicitis.  Given patient's multiple medical problems would recommend medical admission. Given his appendicolith on CT feel he would benefit from laparoscopic appendectomy during admission even if improving with IV abx alone. However, would like for eliquis to wash out first (LD 1/26 at noon) and have cardiology see first for risk stratification and optimization for general anesthesia.  Start antibiotics.  Keep NPO.    He just completed a bolus of IV fluids.  Will ensure BP improves.  Will also follow-up on lactic results.  Please  reach out to our team if patient has any clinical change.   To note, patient was not completely oriented on my exam.  Will need to delineate next of kin.  Addendum: RN reports BP improved to 112/58 on recheck.   FEN - NPO, IVF per primary VTE - SCDs, okay for chemical prophylaxis or therapeutic heparin GTT or Lovenox.  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  Jacinto Halim, [facility] 09/30/2023, 8:12 AM Please see Amion for pager number during day hours 7:00am-4:30pm

## 2023-09-30 NOTE — Progress Notes (Signed)
Pharmacy Antibiotic Note  Bobby [name] is a 78 y.o. male admitted on 09/30/2023 with  appendicitis .  Pharmacy has been consulted for Zosyn dosing. CCS concerned with how he will tolerate surgery and pt on DOAC pta.  Pt received Zosyn one dose in ED this morning.  Plan: Zosyn 3.375gm IV q8h - each dose over 4 hours Will f/u renal function, micro data, and surgery plans  Height: 5\' 9"  (175.3 cm) Weight: 119.9 kg (264 lb 5.3 oz) IBW/kg (Calculated) : 70.7  Temp (24hrs), Avg:97.9 F (36.6 C), Min:97.6 F (36.4 C), Max:98.3 F (36.8 C)  Recent Labs  Lab 09/30/23 0220 09/30/23 0910 09/30/23 1005  WBC 10.1  --   --   CREATININE 2.69*  --   --   LATICACIDVEN  --  1.4 1.4    Estimated Creatinine Clearance: 29.4 mL/min (A) (by C-G formula based on SCr of 2.69 mg/dL (H)).    Allergies  Allergen Reactions   Hydrocodone Itching    Antimicrobials this admission: 1/27 Zosyn >>   Microbiology results: 1/27 BCx:   Thank you for allowing pharmacy to be a part of this patient's care.  Christoper Fabian, PharmD, BCPS Please see amion for complete clinical pharmacist phone list 09/30/2023 5:14 PM

## 2023-09-30 NOTE — ED Triage Notes (Addendum)
BIBA per EMS: pt coming from home w/ c/o lower right sided abd pain.  No previous surgeries.  Denies N/V/D. Reports pain began after eating [employer].  Pt wear 4L o2 at baseline

## 2023-09-30 NOTE — Consult Note (Addendum)
Cardiology Consultation   Patient ID: ATA PECHA MRN: [id number]; DOB: 20-May-1946  Admit date: 09/30/2023 Date of Consult: 09/30/2023  PCP:  Dois Davenport, MD   [facility] Providers Cardiologist:  Nicki Guadalajara, MD  Electrophysiologist:  Regan Lemming, MD  Sleep Medicine:  Nicki Guadalajara, MD       Patient Profile:   Bobby [name] is a 78 y.o. male with a hx of HTN, HLD, DM II, remote small TIA/CVA, CAD s/p inferior and anterior STEMI with resultant akinetic anterior wall and slow swirling in LV apex suggestive of low flow state on Eliquis to prevent LV thrombus, CKD stage IV, COPD, and symptomatic bradycardia s/p Abbott BiV-ICD who is being seen 09/30/2023 for preoperative clearance prior to laparoscopic appendectomy at the request of Bobby [name].  History of Present Illness:   Bobby [name] is a 78 year old male with past medical history of HTN, HLD, DM II, remote small TIA/CVA, CAD s/p inferior and anterior STEMI with resultant akinetic anterior wall and slow swirling in LV apex suggestive of low flow state on Eliquis to prevent LV thrombus, CKD stage IV, LBBB, COPD, and symptomatic bradycardia s/p Abbott BiV-ICD.  Patient had a inferior MI in December 2013 and underwent DES to RCA.  Echocardiogram at the time showed EF 50 to 60% with mid to basal inferior probable scar and moderate pulmonary hypertension with PA pressure of 45 mmHg.  Myoview in July 2015 was low risk showing normal EF and normal wall motion.  Fixed inferior defect most consistent with diaphragmatic attenuation artifact, no evidence of ischemia.  Abdominal ultrasound in 2017 showed normal abdominal aorta.  He presented to the hospital on 08/09/2017 as a anterior STEMI due to acute occlusion of mid LAD at the second diagonal.  He underwent successful PCI of LAD was 2.5 x 22 mm Onyx DES.  He had 99% dominant mid left circumflex residual with 80% lesion in large OM1.  He returned back to the Cath Lab on  08/12/2017 and underwent staged DES to mid left circumflex and DES to OM1.  Troponin peaked greater than 65 on old scale (not hs trop scale) suggestive of significant myocardial injury.  Echocardiogram obtained on 08/09/2017 showed EF 30 to 35%, new large aneurysm involving the mid anteroseptal, anterior, and the septal region consistent with infarction in the LAD territory, grade 1 DD.  Beta-blocker was later held for bradycardia.  Repeat echocardiogram in May 2019 showed EF remain 35 to 40%, grade 1 DD, akinesis of the mid apical anteroseptal and apical myocardium, there was evidence of swirling at the LV apex suggestive of low flow state.  He was initially started on Coumadin to help prevent LV thrombus, this was later transition to Eliquis.  He was admitted in July 2023 was symptomatic bradycardia.  He ultimately received a Abbott Gallant BiV-ICD by Dr. Elberta Fortis on 03/11/2022.  More recently, patient has been established with Dr. Gasper Lloyd of heart failure service on 08/08/2023.  Jardiance was added, Lasix reduced from 40 mg daily down to 20 mg daily.  She is not considered a good candidate for advanced therapy.  He is not on ACE inhibitor/ARB/ARNI and MRA due to CKD.  Recent device interrogation on 09/09/2023 showed normal device function.  He was in his usual state of health until last week when he started having right lower quadrant abdominal pain.  He eventually sought medical attention at [facility], ED on 09/30/2023.  Creatinine at baseline of 2.69.  Electrolyte normal.  CBC shows normal red blood cell count and white blood cell count.  Blood culture negative x 2.  CT of the abdomen concerning for acute appendicitis, no evidence of pneumoperitoneum.  EKG showed sinus rhythm with right bundle branch block and left anterior fascicular block.  Cardiology service consulted for preoperative clearance and optimization of heart failure status prior to upcoming laparoscopic appendectomy.  Talking with the patient, he  denies any recent chest pain or worsening dyspnea.  He does have baseline dyspnea on exertion with any long distance walk.  He lives by himself and his sister is deeply involved in his care.  The most he can do is to walk from the front door to the mailbox, however any distance longer than that would cause him to have shortness of breath.  He is not on oxygen at home.  He described the previous heart attack as acid reflux symptom which he will has not experienced recently.  Note, although patient is well-known to me, he was very drowsy during the interview, he says he did not sleep last night.  He dozed off multiple times during the interview.  Past Medical History:  Diagnosis Date   DM (diabetes mellitus), type 2 new diagnosis 08/05/2012   diet control   ED (erectile dysfunction)    surgery planned   Hyperlipidemia 08/05/2012   Hypertensive heart disease    Panic attack    Pulmonary HTN (HCC)    echo 08/05/12, EF 55-60%, PA pressure 45mm   S/P coronary artery stent placement, to RCA Promus DES 08/05/2012   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   Tobacco abuse     Past Surgical History:  Procedure Laterality Date   BIV ICD INSERTION CRT-D N/A 03/11/2022   Procedure: BIV ICD INSERTION CRT-D;  Surgeon: Regan Lemming, MD;  Location: [facility] INVASIVE CV LAB;  Service: Cardiovascular;  Laterality: N/A;   CARDIAC CATHETERIZATION  08/04/12   PCI to RCA with DES   CORONARY STENT INTERVENTION N/A 08/12/2017   Procedure: CORONARY STENT INTERVENTION;  Surgeon: Swazil[place], Peter M, MD;  Location: [facility] INVASIVE CV LAB;  Service: Cardiovascular;  Laterality: N/A;   CORONARY/GRAFT ACUTE MI REVASCULARIZATION N/A 08/09/2017   Procedure: Coronary/Graft Acute MI Revascularization;  Surgeon: Lyn Records, MD;  Location: [facility] INVASIVE CV LAB;  Service: Cardiovascular;  Laterality: N/A;   LEFT HEART CATH AND CORONARY ANGIOGRAPHY N/A 08/09/2017   Procedure: LEFT HEART CATH AND CORONARY ANGIOGRAPHY;  Surgeon:  Lyn Records, MD;  Location: [facility] INVASIVE CV LAB;  Service: Cardiovascular;  Laterality: N/A;   LEFT HEART CATHETERIZATION WITH CORONARY ANGIOGRAM N/A 08/04/2012   Procedure: LEFT HEART CATHETERIZATION WITH CORONARY ANGIOGRAM;  Surgeon: Lennette Bihari, MD;  Location: [facility] CATH LAB;  Service: Cardiovascular;  Laterality: N/A;   LEG SURGERY Left    rod placed for fracture repair   PENILE PROSTHESIS IMPLANT N/A 06/03/2014   Procedure: IMPLANT PENILE PROTHESIS INFLATABLE;  Surgeon: Kathi Ludwig, MD;  Location: [facility] ORS;  Service: Urology;  Laterality: N/A;  with penile block--0.5% marcaine plain   PERCUTANEOUS CORONARY STENT INTERVENTION (PCI-S) Right 08/04/2012   Procedure: PERCUTANEOUS CORONARY STENT INTERVENTION (PCI-S);  Surgeon: Lennette Bihari, MD;  Location: [facility] CATH LAB;  Service: Cardiovascular;  Laterality: Right;   TEMPORARY PACEMAKER N/A 03/08/2022   Procedure: TEMPORARY PACEMAKER;  Surgeon: Lyn Records, MD;  Location: [facility] INVASIVE CV LAB;  Service: Cardiovascular;  Laterality: N/A;   THUMB ARTHROSCOPY Left    thumb joint replaced  Home Medications:  Prior to Admission medications   Medication Sig Start Date End Date Taking? Authorizing Provider  acetaminophen (TYLENOL) 325 MG tablet Take 2 tablets (650 mg total) by mouth every 4 (four) hours as needed for headache or mild pain. 03/12/22   Graciella Freer, PA-C  albuterol (PROVENTIL HFA;VENTOLIN HFA) 108 (90 Base) MCG/ACT inhaler Inhale 2 puffs into the lungs every 6 (six) hours as needed for wheezing or shortness of breath. 08/21/17   Manson Passey, PA  amLODipine (NORVASC) 5 MG tablet TAKE ONE TABLET BY MOUTH DAILY 07/19/22   Lennette Bihari, MD  apixaban (ELIQUIS) 5 MG TABS tablet Take 1 tablet (5 mg total) by mouth 2 (two) times daily. 03/15/22   Graciella Freer, PA-C  aspirin EC 81 MG tablet Take 1 tablet (81 mg total) by mouth daily. Swallow whole. 03/15/22   Graciella Freer, PA-C  atorvastatin  (LIPITOR) 80 MG tablet Take 1 tablet (80 mg total) by mouth daily at 6 PM. 03/11/18   Lennette Bihari, MD  BREZTRI AEROSPHERE 160-9-4.8 MCG/ACT AERO Inhale 2 puffs into the lungs 2 (two) times daily. 01/06/20   [provider]  buPROPion (WELLBUTRIN XL) 300 MG 24 hr tablet Take 300 mg by mouth daily.    [provider]  carvedilol (COREG) 25 MG tablet TAKE ONE TABLET BY MOUTH TWICE DAILY 06/13/23   Lennette Bihari, MD  clonazePAM (KLONOPIN) 1 MG tablet Take 1 mg by mouth 2 (two) times daily as needed for anxiety. 02/10/22   [provider]  Coenzyme Q10 (COQ10 PO) Take 1 capsule by mouth daily.    [provider]  COLCRYS 0.6 MG tablet Take 0.6 mg by mouth daily. 03/21/17   [provider]  empagliflozin (JARDIANCE) 10 MG TABS tablet Take 1 tablet (10 mg total) by mouth daily before breakfast. 08/08/23   Sabharwal, Aditya, DO  ferrous sulfate 325 (65 FE) MG EC tablet Take 325 mg by mouth 2 (two) times a week.    [provider]  furosemide (LASIX) 20 MG tablet Take 1 tablet (20 mg total) by mouth daily. 08/08/23   Sabharwal, Aditya, DO  hydrALAZINE (APRESOLINE) 50 MG tablet Take 1 tablet (50 mg total) by mouth every 8 (eight) hours. 03/12/22   Graciella Freer, PA-C  isosorbide mononitrate (IMDUR) 30 MG 24 hr tablet Take 1 tablet (30 mg total) by mouth daily. 03/12/22   Graciella Freer, PA-C  nitroGLYCERIN (NITROSTAT) 0.4 MG SL tablet DISSOLVE 1 TABLET UNDER THE TONGUE EVERY 5 MINUTES AS NEEDED FOR CHEST PAIN. DO NOT EXCEED A TOTAL OF 3 DOSES IN 15 MINUTES. 07/11/23   Lennette Bihari, MD  Omega-3 Fatty Acids (FISH OIL PO) Take 1 tablet by mouth daily.    [provider]  oxyCODONE-acetaminophen (PERCOCET) 10-325 MG per tablet Take 1 tablet by mouth every 6 (six) hours as needed for pain.    [provider]  predniSONE (DELTASONE) 10 MG tablet Take 10 mg by mouth daily.    [provider]  sertraline (ZOLOFT) 100 MG  tablet Take 100 mg by mouth daily.    [provider]  Dwyane Luo 200-62.5-25 MCG/ACT AEPB Inhale 1 puff into the lungs daily. 04/02/22   [provider]  Vitamin D, Ergocalciferol, (DRISDOL) 1.25 MG (50000 UNIT) CAPS capsule Take 50,000 Units by mouth 2 (two) times a week. 01/06/22   [provider]    Inpatient Medications: Scheduled Meds:  heparin  5,000 Units  Subcutaneous Q8H   insulin aspart  0-20 Units Subcutaneous TID WC   nicotine  21 mg Transdermal Daily   Continuous Infusions:  PRN Meds: HYDROmorphone (DILAUDID) injection  Allergies:    Allergies  Allergen Reactions   Hydrocodone Itching    Social History:   Social History   Socioeconomic History   Marital status: Legally Separated    Spouse name: Not on file   Number of children: Not on file   Years of education: Not on file   Highest education level: Not on file  Occupational History   Not on file  Tobacco Use   Smoking status: Every Day    Current packs/day: 0.25    Average packs/day: 0.3 packs/day for 60.0 years (15.0 ttl pk-yrs)    Types: Cigarettes   Smokeless tobacco: Never   Tobacco comments:    Currently smoking about .5ppd as of 06/14/21 ep    Patient smokes 1/2 pack daily 04/06/2022  Substance and Sexual Activity   Alcohol use: Not Currently    Comment: ocasionally   Drug use: No   Sexual activity: Not on file  Other Topics Concern   Not on file  Social History Narrative   Not on file   Social Drivers of Health   Financial Resource Strain: Not on file  Food Insecurity: Not on file  Transportation Needs: Not on file  Physical Activity: Not on file  Stress: Not on file  Social Connections: Not on file  Intimate Partner Violence: Not on file    Family History:    Family History  Problem Relation Age of Onset   Kidney disease Father        kidney disease and heart disease   Heart disease Maternal Grandmother    Stomach cancer Maternal Grandfather     Heart attack Paternal Grandmother      ROS:  Please see the history of present illness.   All other ROS reviewed and negative.     Physical Exam/Data:   Vitals:   09/30/23 0830 09/30/23 0900 09/30/23 1003 09/30/23 1127  BP: 121/62 (!) 112/58    Pulse: 80 82    Resp: 19 (!) 24    Temp:   97.7 F (36.5 C)   TempSrc:   Oral   SpO2: 98% 96%    Weight:    119.9 kg  Height:    5\' 9"  (1.753 m)   No intake or output data in the 24 hours ending 09/30/23 1133    09/30/2023   11:27 AM 08/08/2023   11:41 AM 04/22/2023   11:04 AM  Last 3 Weights  Weight (lbs) 264 lb 5.3 oz 264 lb 6.4 oz 255 lb  Weight (kg) 119.9 kg 119.931 kg 115.667 kg     Body mass index is 39.03 kg/m.  General:  Well nourished, well developed, in no acute distress HEENT: normal Neck: no JVD Vascular: No carotid bruits; Distal pulses 2+ bilaterally Cardiac:  normal S1, S2; RRR; no murmur  Lungs:  clear to auscultation bilaterally, no wheezing, rhonchi or rales  Abd: soft, nontender, no hepatomegaly  Ext: no edema Musculoskeletal:  No deformities, BUE and BLE strength normal and equal Skin: warm and dry  Neuro: Unable to assess, very sleepy during interview. Psych: Unable to assess, very drowsy during interview  EKG:  The EKG was personally reviewed and demonstrates: Sinus rhythm, right bundle branch block and left anterior fascicular block. Telemetry:  Telemetry was personally reviewed and demonstrates: Not on telemetry.  Relevant  CV Studies:  Cath 08/12/2017 Previously placed Prox LAD to Mid LAD stent (unknown type) is widely patent. Mid Cx lesion is 99% stenosed. A drug-eluting stent was successfully placed using a STENT SIERRA 3.00 X 28 MM. Post intervention, there is a 0% residual stenosis. Ost 1st Mrg to 1st Mrg lesion is 85% stenosed. A drug-eluting stent was successfully placed using a STENT RESOLUTE ONYX 3.0X18. Post intervention, there is a 0% residual stenosis.   1. Successful stenting of the  mid LCx with a DES 2. Successful stenting of the first OM with DES   Plan: DAPT for at least one year. Hydrate. Anticipate DC in am if renal function is stable and no complication.   Echo 10/17/2022  1. Left ventricular systolic function is moderately reduced to 35%-40%.  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 There is mild left ventricular hypertrophy.  Grade 1 diastolic dysfunction.   2. Right ventricular systolic function is normal. The right ventricular  size is normal.   3. The mitral valve is normal in structure. No evidence of mitral valve  regurgitation. No evidence of mitral stenosis.   4. The aortic valve is normal in structure. Aortic valve regurgitation is  not visualized. No aortic stenosis is present.   5. The inferior vena cava is normal in size with greater than 50%  respiratory variability, suggesting right atrial pressure of 3 mmHg.   6. Left atrial size was moderately dilated.      Laboratory Data:  High Sensitivity Troponin:  No results for input(s): "TROPONINIHS" in the last 720 hours.   Chemistry Recent Labs  Lab 09/30/23 0220  NA 135  K 4.4  CL 106  CO2 19*  GLUCOSE 92  BUN 43*  CREATININE 2.69*  CALCIUM 9.8  GFRNONAA 24*  ANIONGAP 10    Recent Labs  Lab 09/30/23 0220  PROT 6.9  ALBUMIN 3.7  AST 20  ALT 20  ALKPHOS 91  BILITOT 1.0   Lipids No results for input(s): "CHOL", "TRIG", "HDL", "LABVLDL", "LDLCALC", "CHOLHDL" in the last 168 hours.  Hematology Recent Labs  Lab 09/30/23 0220  WBC 10.1  RBC 5.19  HGB 14.9  HCT 47.8  MCV 92.1  MCH 28.7  MCHC 31.2  RDW 14.6  PLT 186   Thyroid No results for input(s): "TSH", "FREET4" in the last 168 hours.  BNPNo results for input(s): "BNP", "PROBNP" in the last 168 hours.  DDimer No results for input(s): "DDIMER" in the last 168 hours.   Radiology/Studies:  CT ABDOMEN PELVIS WO CONTRAST Result Date: 09/30/2023 CLINICAL DATA:  78 year old male with abdominal pain. EXAM: CT ABDOMEN AND PELVIS  WITHOUT CONTRAST TECHNIQUE: Multidetector CT imaging of the abdomen and pelvis was performed following the standard protocol without IV contrast. RADIATION DOSE REDUCTION: This exam was performed according to the departmental dose-optimization program which includes automated exposure control, adjustment of the mA and/or kV according to patient size and/or use of iterative reconstruction technique. COMPARISON:  Chest CT 08/30/2023. FINDINGS: Lower chest: Stable cardiomegaly, partially visible cardiac pacemaker. No pericardial effusion. Patchy and confluent new bibasilar lung opacity most resembles atelectasis affecting the lingula and both costophrenic angles. No pleural effusion. Hepatobiliary: Negative noncontrast liver and gallbladder. Pancreas: Negative. Spleen: Negative. Adrenals/Urinary Tract: Bilateral adrenal gland thickening compatible with hyperplasia. Nonobstructed kidneys with multiple bilateral renal cysts, the largest have simple fluid density (no follow-up imaging recommended). No nephrolithiasis. Extensive but symmetric and nonspecific bilateral pararenal space fat stranding as seen on coronal image 60. Ureters are decompressed to  the bladder. Decompressed and unremarkable urinary bladder. Stomach/Bowel: Decompressed and unremarkable rectum. Thick-walled sigmoid colon at the right pelvic inlet along a segment of about 7 cm (series 3, image 70). Wall thickening continues proximally but is less pronounced. Superimposed diverticulosis in the more proximal sigmoid colon (series 3, image 75). Retained stool in the descending colon which has a more normal appearance. Retained gas and stool in the transverse colon which appears negative. Ascending colon is decompressed. Right lower quadrant inflammation at the tip of the cecum (series 3, image 71) is surrounding and dependent adjacent to abnormal appendix. Appendix: Location: Medial to the tip of the cecum coronal image 77 Diameter: 14 mm Appendicolith:  Positive (image 77) Mucosal hyper-enhancement: Noncontrast exam. Extraluminal gas: Negative. Periappendiceal collection: Regional inflammation and perhaps trace free fluid (series 3, image 68). Small bowel loops in the lower abdomen are widely irregular but decompressed. Retained fluid in the stomach. Decompressed duodenum. No free intraperitoneal air. No dilated small bowel. Small volume of free fluid in both gutters. Vascular/Lymphatic: Aortoiliac calcified atherosclerosis. Normal caliber abdominal aorta. Vascular patency is not evaluated in the absence of IV contrast. No lymphadenopathy identified. Reproductive: Penile implant. Other: No pelvis free fluid. Musculoskeletal: Advanced lower thoracic disc and endplate degeneration. No acute osseous abnormality identified. IMPRESSION: 1. Acute Appendicitis with appendicolith. No pneumoperitoneum to indicate perforation, and only a small volume of free fluid, but appearance of Widespread Peritonitis with extensive small bowel and multiple large bowel segments appearing actively inflamed. 2. No other acute finding; Lung base atelectasis. Nonspecific bilateral pararenal space inflammation without obstructive uropathy. Aortic Atherosclerosis (ICD10-I70.0). Electronically Signed   By: Odessa Fleming M.D.   On: 09/30/2023 05:58     Assessment and Plan:   Preoperative clearance  -Denies any recent anginal symptom.  History of significant inferior STEMI in 2013 and anterior STEMI in 2018.  He is unable to achieve more than 4 METS of activity.  EKG is unchanged.  - Heart failure therapy optimized. He appears to be euvolemic on exam. Will discuss with MD to see if any ischemic workup is warranted prior to laparoscopic appendectomy. Given prior history, he is likely moderate to high risk patient going for a moderate risk procedure.   Addendum: discussed with MD, myoview carry high risk of false positive in this patient with likely large scar in the anterior wall and unlikely  to significantly lower his overall surgical risk. No ischemic work up is recommended. Patient is optimized from the cardiac perspective for surgery.   Acute appendicitis: Seen by general surgery, planning for possible laparoscopic appendectomy.  CAD: History of both inferior STEMI in 2013 and subsequent anterior STEMI in December 2018.  During last cardiac catheterization in 2018, patient underwent DES to mid LAD and staged PCI of left circumflex and OM.  Ischemic cardiomyopathy s/p Abbott BiV-ICD: Recent device interrogation shows normal functioning ICD.  On carvedilol, Imdur and hydralazine at home.  Euvolemic on exam.  Recently established with heart failure service, Lasix reduced to 20 mg daily (from 40mg  daily) after addition of Jardiance.  Volume status optimized.  Anterior wall infarction with evidence of low flow state on echo at risk for LV thrombus: on Eliquis, currently held for possible surgery  CKD stage IV: Renal function stable  COPD: History of tobacco abuse.  Not on oxygen at home.  History of TIA/CVA: Remote  Hypertension: On amlodipine, carvedilol, hydralazine and Imdur.  Not a candidate for ACE inhibitor/ARB/Arni/MRA given poor renal function.  Hyperlipidemia: On atorvastatin 80 mg  daily.  DM2: Started on Jardiance recently for heart failure.   Risk Assessment/Risk Scores:        New York Heart Association (NYHA) Functional Class NYHA Class III        For questions or updates, please contact [facility] Please consult [email or web address] for contact info under    Signed, Azalee Course, PA  09/30/2023 11:33 AM  I have personally seen and examined this patient. I agree with the assessment and plan as outlined above.  78 yo male with complex history including HTN, HLD, DM, CKD, prior CVA, CAD with prior MI, ischemic cardiomyopathy, chronic systolic CHF, COPD who is admitted with abdominal pain and found to have acute appendicitis. Cardiology is asked to see  him to review his cardiac risk prior to surgery.  He has no cardiac complaints. Baseline dyspnea. No angina.  EKG with sinus with RBBB Labs reviewed My exam: Somnolent. CV:RRR Pulm: clear bilaterally Ext: no LE edema  I agree that he is at moderate risk for any procedure given his cardiac history but his cardiac issues are stable. He is not active at baseline. He has not seen any change in his baseline dyspnea.  I would proceed with his planned surgical procedure without further cardiac workup.   Verne Carrow, MD, [facility] 09/30/2023 12:49 PM

## 2023-10-01 ENCOUNTER — Inpatient Hospital Stay (HOSPITAL_COMMUNITY): Payer: 59 | Admitting: Certified Registered Nurse Anesthetist

## 2023-10-01 ENCOUNTER — Encounter (HOSPITAL_COMMUNITY): Payer: Self-pay | Admitting: Internal Medicine

## 2023-10-01 ENCOUNTER — Encounter (HOSPITAL_COMMUNITY)
Admission: EM | Disposition: A | Discharge status: Skilled Nursing Facility | Payer: Self-pay | Source: Home / Self Care | Attending: Internal Medicine

## 2023-10-01 ENCOUNTER — Other Ambulatory Visit: Payer: Self-pay

## 2023-10-01 ENCOUNTER — Inpatient Hospital Stay (HOSPITAL_COMMUNITY): Payer: 59

## 2023-10-01 DIAGNOSIS — I4510 Unspecified right bundle-branch block: Secondary | ICD-10-CM

## 2023-10-01 DIAGNOSIS — A419 Sepsis, unspecified organism: Secondary | ICD-10-CM

## 2023-10-01 DIAGNOSIS — J9601 Acute respiratory failure with hypoxia: Secondary | ICD-10-CM | POA: Diagnosis not present

## 2023-10-01 DIAGNOSIS — K3580 Unspecified acute appendicitis: Secondary | ICD-10-CM | POA: Diagnosis not present

## 2023-10-01 DIAGNOSIS — I2510 Atherosclerotic heart disease of native coronary artery without angina pectoris: Secondary | ICD-10-CM | POA: Diagnosis not present

## 2023-10-01 DIAGNOSIS — K35209 Acute appendicitis with generalized peritonitis, without abscess, unspecified as to perforation: Secondary | ICD-10-CM | POA: Diagnosis not present

## 2023-10-01 DIAGNOSIS — I493 Ventricular premature depolarization: Secondary | ICD-10-CM | POA: Diagnosis not present

## 2023-10-01 DIAGNOSIS — J9602 Acute respiratory failure with hypercapnia: Secondary | ICD-10-CM

## 2023-10-01 DIAGNOSIS — E119 Type 2 diabetes mellitus without complications: Secondary | ICD-10-CM

## 2023-10-01 DIAGNOSIS — R6521 Severe sepsis with septic shock: Secondary | ICD-10-CM | POA: Diagnosis not present

## 2023-10-01 DIAGNOSIS — N179 Acute kidney failure, unspecified: Secondary | ICD-10-CM

## 2023-10-01 DIAGNOSIS — G9341 Metabolic encephalopathy: Secondary | ICD-10-CM

## 2023-10-01 HISTORY — PX: LAPAROSCOPIC APPENDECTOMY: SHX408

## 2023-10-01 LAB — POCT I-STAT, CHEM 8
BUN: 72 mg/dL — ABNORMAL HIGH (ref 8–23)
Calcium, Ion: 1.24 mmol/L (ref 1.15–1.40)
Chloride: 108 mmol/L (ref 98–111)
Creatinine, Ser: 4.9 mg/dL — ABNORMAL HIGH (ref 0.61–1.24)
Glucose, Bld: 109 mg/dL — ABNORMAL HIGH (ref 70–99)
HCT: 44 % (ref 39.0–52.0)
Hemoglobin: 15 g/dL (ref 13.0–17.0)
Potassium: 5.1 mmol/L (ref 3.5–5.1)
Sodium: 138 mmol/L (ref 135–145)
TCO2: 21 mmol/L — ABNORMAL LOW (ref 22–32)

## 2023-10-01 LAB — COMPREHENSIVE METABOLIC PANEL
ALT: 18 U/L (ref 0–44)
AST: 28 U/L (ref 15–41)
Albumin: 3 g/dL — ABNORMAL LOW (ref 3.5–5.0)
Alkaline Phosphatase: 48 U/L (ref 38–126)
Anion gap: 14 (ref 5–15)
BUN: 77 mg/dL — ABNORMAL HIGH (ref 8–23)
CO2: 17 mmol/L — ABNORMAL LOW (ref 22–32)
Calcium: 8.7 mg/dL — ABNORMAL LOW (ref 8.9–10.3)
Chloride: 107 mmol/L (ref 98–111)
Creatinine, Ser: 4.74 mg/dL — ABNORMAL HIGH (ref 0.61–1.24)
GFR, Estimated: 12 mL/min — ABNORMAL LOW (ref >60)
Glucose, Bld: 122 mg/dL — ABNORMAL HIGH (ref 70–99)
Potassium: 5.3 mmol/L — ABNORMAL HIGH (ref 3.5–5.1)
Sodium: 138 mmol/L (ref 135–145)
Total Bilirubin: 0.7 mg/dL (ref 0.0–1.2)
Total Protein: 6.4 g/dL — ABNORMAL LOW (ref 6.5–8.1)

## 2023-10-01 LAB — POCT I-STAT 7, (LYTES, BLD GAS, ICA,H+H)
Acid-base deficit: 10 mmol/L — ABNORMAL HIGH (ref 0.0–2.0)
Acid-base deficit: 9 mmol/L — ABNORMAL HIGH (ref 0.0–2.0)
Bicarbonate: 18.4 mmol/L — ABNORMAL LOW (ref 20.0–28.0)
Bicarbonate: 16.2 mmol/L — ABNORMAL LOW (ref 20.0–28.0)
Calcium, Ion: 1.23 mmol/L (ref 1.15–1.40)
Calcium, Ion: 1.23 mmol/L (ref 1.15–1.40)
HCT: 37 % — ABNORMAL LOW (ref 39.0–52.0)
HCT: 37 % — ABNORMAL LOW (ref 39.0–52.0)
Hemoglobin: 12.6 g/dL — ABNORMAL LOW (ref 13.0–17.0)
Hemoglobin: 12.6 g/dL — ABNORMAL LOW (ref 13.0–17.0)
O2 Saturation: 92 %
O2 Saturation: 95 %
Patient temperature: 98.2
Patient temperature: 98.6
Potassium: 5.2 mmol/L — ABNORMAL HIGH (ref 3.5–5.1)
Potassium: 5 mmol/L (ref 3.5–5.1)
Sodium: 138 mmol/L (ref 135–145)
Sodium: 137 mmol/L (ref 135–145)
TCO2: 20 mmol/L — ABNORMAL LOW (ref 22–32)
TCO2: 17 mmol/L — ABNORMAL LOW (ref 22–32)
pCO2 arterial: 49.7 mm[Hg] — ABNORMAL HIGH (ref 32–48)
pCO2 arterial: 32.1 mm[Hg] (ref 32–48)
pH, Arterial: 7.174 — CL (ref 7.35–7.45)
pH, Arterial: 7.311 — ABNORMAL LOW (ref 7.35–7.45)
pO2, Arterial: 79 mm[Hg] — ABNORMAL LOW (ref 83–108)
pO2, Arterial: 81 mm[Hg] — ABNORMAL LOW (ref 83–108)

## 2023-10-01 LAB — GLUCOSE, CAPILLARY
Glucose-Capillary: 92 mg/dL (ref 70–99)
Glucose-Capillary: 92 mg/dL (ref 70–99)
Glucose-Capillary: 105 mg/dL — ABNORMAL HIGH (ref 70–99)
Glucose-Capillary: 104 mg/dL — ABNORMAL HIGH (ref 70–99)
Glucose-Capillary: 117 mg/dL — ABNORMAL HIGH (ref 70–99)
Glucose-Capillary: 133 mg/dL — ABNORMAL HIGH (ref 70–99)
Glucose-Capillary: 153 mg/dL — ABNORMAL HIGH (ref 70–99)

## 2023-10-01 LAB — BASIC METABOLIC PANEL
Anion gap: 14 (ref 5–15)
Anion gap: 14 (ref 5–15)
BUN: 67 mg/dL — ABNORMAL HIGH (ref 8–23)
BUN: 80 mg/dL — ABNORMAL HIGH (ref 8–23)
CO2: 19 mmol/L — ABNORMAL LOW (ref 22–32)
CO2: 15 mmol/L — ABNORMAL LOW (ref 22–32)
Calcium: 9.3 mg/dL (ref 8.9–10.3)
Calcium: 9.1 mg/dL (ref 8.9–10.3)
Chloride: 106 mmol/L (ref 98–111)
Chloride: 107 mmol/L (ref 98–111)
Creatinine, Ser: 4.48 mg/dL — ABNORMAL HIGH (ref 0.61–1.24)
Creatinine, Ser: 4.73 mg/dL — ABNORMAL HIGH (ref 0.61–1.24)
GFR, Estimated: 13 mL/min — ABNORMAL LOW (ref >60)
GFR, Estimated: 12 mL/min — ABNORMAL LOW (ref >60)
Glucose, Bld: 100 mg/dL — ABNORMAL HIGH (ref 70–99)
Glucose, Bld: 144 mg/dL — ABNORMAL HIGH (ref 70–99)
Potassium: 5.5 mmol/L — ABNORMAL HIGH (ref 3.5–5.1)
Potassium: 5.1 mmol/L (ref 3.5–5.1)
Sodium: 139 mmol/L (ref 135–145)
Sodium: 136 mmol/L (ref 135–145)

## 2023-10-01 LAB — URINALYSIS, ROUTINE W REFLEX MICROSCOPIC

## 2023-10-01 LAB — CBC
HCT: 43.6 % (ref 39.0–52.0)
HCT: 39.7 % (ref 39.0–52.0)
Hemoglobin: 13.8 g/dL (ref 13.0–17.0)
Hemoglobin: 12.5 g/dL — ABNORMAL LOW (ref 13.0–17.0)
MCH: 28.9 pg (ref 26.0–34.0)
MCH: 29.3 pg (ref 26.0–34.0)
MCHC: 31.7 g/dL (ref 30.0–36.0)
MCHC: 31.5 g/dL (ref 30.0–36.0)
MCV: 91.4 fL (ref 80.0–100.0)
MCV: 93.2 fL (ref 80.0–100.0)
Platelets: 127 10*3/uL — ABNORMAL LOW (ref 150–400)
Platelets: 149 10*3/uL — ABNORMAL LOW (ref 150–400)
RBC: 4.77 MIL/uL (ref 4.22–5.81)
RBC: 4.26 MIL/uL (ref 4.22–5.81)
RDW: 14.7 % (ref 11.5–15.5)
RDW: 14.8 % (ref 11.5–15.5)
WBC: 14.5 10*3/uL — ABNORMAL HIGH (ref 4.0–10.5)
WBC: 14.9 10*3/uL — ABNORMAL HIGH (ref 4.0–10.5)
nRBC: 0 % (ref 0.0–0.2)
nRBC: 0 % (ref 0.0–0.2)

## 2023-10-01 LAB — URINALYSIS, MICROSCOPIC (REFLEX): RBC / HPF: >50 RBC/hpf (ref 0–5)

## 2023-10-01 LAB — SURGICAL PCR SCREEN
MRSA, PCR: NEGATIVE
Staphylococcus aureus: NEGATIVE

## 2023-10-01 LAB — LACTIC ACID, PLASMA: Lactic Acid, Venous: 1.4 mmol/L (ref 0.5–1.9)

## 2023-10-01 SURGERY — APPENDECTOMY, LAPAROSCOPIC
Anesthesia: General | Site: Abdomen

## 2023-10-01 MED ORDER — SODIUM CHLORIDE 0.9 % IV SOLN: 250.0000 mL | INTRAVENOUS | Status: DC

## 2023-10-01 MED ORDER — PROPOFOL 10 MG/ML IV BOLUS
INTRAVENOUS | Status: DC | PRN
  Administered 2023-10-01: 50 mg via INTRAVENOUS
  Administered 2023-10-01: 40 mg via INTRAVENOUS
  Administered 2023-10-01: 50 ug/kg/min via INTRAVENOUS

## 2023-10-01 MED ORDER — FENTANYL CITRATE (PF) 250 MCG/5ML IJ SOLN
INTRAMUSCULAR | Status: DC | PRN
  Administered 2023-10-01 (×2): 50 ug via INTRAVENOUS

## 2023-10-01 MED ORDER — DEXAMETHASONE SODIUM PHOSPHATE 10 MG/ML IJ SOLN
INTRAMUSCULAR | Status: AC
  Filled 2023-10-01: qty 1

## 2023-10-01 MED ORDER — SUCCINYLCHOLINE CHLORIDE 200 MG/10ML IV SOSY
PREFILLED_SYRINGE | INTRAVENOUS | Status: DC | PRN
  Administered 2023-10-01: 180 mg via INTRAVENOUS

## 2023-10-01 MED ORDER — ALBUMIN HUMAN 5 % IV SOLN: INTRAVENOUS | Status: DC | PRN

## 2023-10-01 MED ORDER — CHLORHEXIDINE GLUCONATE CLOTH 2 % EX PADS: 6.0000 | MEDICATED_PAD | Freq: Every day | CUTANEOUS | Status: DC

## 2023-10-01 MED ORDER — ACETAMINOPHEN 500 MG PO TABS: 1000.0000 mg | ORAL_TABLET | Freq: Once | ORAL | Status: DC | PRN

## 2023-10-01 MED ORDER — CHLORHEXIDINE GLUCONATE CLOTH 2 % EX PADS: 6.0000 | MEDICATED_PAD | Freq: Once | CUTANEOUS | Status: DC

## 2023-10-01 MED ORDER — ORAL CARE MOUTH RINSE: 15.0000 mL | OROMUCOSAL | Status: DC | PRN

## 2023-10-01 MED ORDER — ONDANSETRON HCL 4 MG/2ML IJ SOLN
INTRAMUSCULAR | Status: AC
  Filled 2023-10-01: qty 2

## 2023-10-01 MED ORDER — 0.9 % SODIUM CHLORIDE (POUR BTL) OPTIME
TOPICAL | Status: DC | PRN
  Administered 2023-10-01: 1000 mL

## 2023-10-01 MED ORDER — SODIUM CHLORIDE 0.9 % IV SOLN: INTRAVENOUS | Status: DC

## 2023-10-01 MED ORDER — INSULIN ASPART 100 UNIT/ML IJ SOLN
0.0000 [IU] | INTRAMUSCULAR | Status: DC
Start: 2023-10-01 — End: 2023-10-11
  Administered 2023-10-01: 3 [IU] via SUBCUTANEOUS
  Administered 2023-10-01: 4 [IU] via SUBCUTANEOUS
  Administered 2023-10-02 – 2023-10-03 (×6): 3 [IU] via SUBCUTANEOUS
  Administered 2023-10-03: 4 [IU] via SUBCUTANEOUS
  Administered 2023-10-04 (×5): 3 [IU] via SUBCUTANEOUS
  Administered 2023-10-05: 4 [IU] via SUBCUTANEOUS
  Administered 2023-10-05 – 2023-10-08 (×7): 3 [IU] via SUBCUTANEOUS
  Administered 2023-10-08: 4 [IU] via SUBCUTANEOUS
  Administered 2023-10-09: 3 [IU] via SUBCUTANEOUS
  Administered 2023-10-09: 4 [IU] via SUBCUTANEOUS
  Administered 2023-10-10 (×2): 3 [IU] via SUBCUTANEOUS
  Administered 2023-10-10: 4 [IU] via SUBCUTANEOUS
  Administered 2023-10-10 – 2023-10-11 (×4): 3 [IU] via SUBCUTANEOUS

## 2023-10-01 MED ORDER — BUPIVACAINE-EPINEPHRINE 0.25% -1:200000 IJ SOLN
INTRAMUSCULAR | Status: DC | PRN
  Administered 2023-10-01: 30 mL

## 2023-10-01 MED ORDER — ACETAMINOPHEN 325 MG PO TABS: 650.0000 mg | ORAL_TABLET | Freq: Four times a day (QID) | ORAL | Status: DC

## 2023-10-01 MED ORDER — MUPIROCIN 2 % EX OINT: 1.0000 | TOPICAL_OINTMENT | Freq: Two times a day (BID) | CUTANEOUS | Status: DC

## 2023-10-01 MED ORDER — ACETAMINOPHEN 160 MG/5ML PO SOLN: 1000.0000 mg | Freq: Once | ORAL | Status: DC | PRN

## 2023-10-01 MED ORDER — VASOPRESSIN 20 UNIT/ML IV SOLN
INTRAVENOUS | Status: AC
  Filled 2023-10-01: qty 1

## 2023-10-01 MED ORDER — FENTANYL CITRATE PF 50 MCG/ML IJ SOSY
25.0000 ug | PREFILLED_SYRINGE | INTRAMUSCULAR | Status: DC | PRN
  Administered 2023-10-01: 25 ug via INTRAVENOUS
  Filled 2023-10-01: qty 1

## 2023-10-01 MED ORDER — ONDANSETRON HCL 4 MG/2ML IJ SOLN
INTRAMUSCULAR | Status: DC | PRN
  Administered 2023-10-01: 4 mg via INTRAVENOUS

## 2023-10-01 MED ORDER — HYDROCORTISONE SOD SUC (PF) 100 MG IJ SOLR
100.0000 mg | Freq: Two times a day (BID) | INTRAMUSCULAR | Status: DC
  Administered 2023-10-01 – 2023-10-02 (×2): 100 mg via INTRAVENOUS
  Filled 2023-10-01 (×2): qty 2

## 2023-10-01 MED ORDER — REVEFENACIN 175 MCG/3ML IN SOLN
175.0000 ug | Freq: Every day | RESPIRATORY_TRACT | Status: DC
  Administered 2023-10-02 – 2023-10-06 (×5): 175 ug via RESPIRATORY_TRACT
  Filled 2023-10-01 (×5): qty 3

## 2023-10-01 MED ORDER — CHLORHEXIDINE GLUCONATE 0.12 % MT SOLN
OROMUCOSAL | Status: AC
  Administered 2023-10-01: 15 mL
  Filled 2023-10-01: qty 15

## 2023-10-01 MED ORDER — EPHEDRINE SULFATE-NACL 50-0.9 MG/10ML-% IV SOSY
PREFILLED_SYRINGE | INTRAVENOUS | Status: DC | PRN
  Administered 2023-10-01: 5 mg via INTRAVENOUS

## 2023-10-01 MED ORDER — SODIUM CHLORIDE 0.9 % IV BOLUS
500.0000 mL | Freq: Once | INTRAVENOUS | Status: AC
  Administered 2023-10-01: 500 mL via INTRAVENOUS

## 2023-10-01 MED ORDER — LIDOCAINE 2% (20 MG/ML) 5 ML SYRINGE
INTRAMUSCULAR | Status: DC | PRN
  Administered 2023-10-01: 60 mg via INTRAVENOUS

## 2023-10-01 MED ORDER — SUGAMMADEX SODIUM 200 MG/2ML IV SOLN
INTRAVENOUS | Status: DC | PRN
  Administered 2023-10-01: 200 mg via INTRAVENOUS

## 2023-10-01 MED ORDER — FENTANYL CITRATE (PF) 250 MCG/5ML IJ SOLN
INTRAMUSCULAR | Status: AC
  Filled 2023-10-01: qty 5

## 2023-10-01 MED ORDER — NOREPINEPHRINE 4 MG/250ML-% IV SOLN: 2.0000 ug/min | INTRAVENOUS | Status: DC

## 2023-10-01 MED ORDER — DEXTROSE 50 % IV SOLN: 1.0000 | Freq: Once | INTRAVENOUS | Status: DC

## 2023-10-01 MED ORDER — BUDESONIDE 0.5 MG/2ML IN SUSP
0.5000 mg | Freq: Two times a day (BID) | RESPIRATORY_TRACT | Status: DC
  Administered 2023-10-01 – 2023-10-06 (×10): 0.5 mg via RESPIRATORY_TRACT
  Filled 2023-10-01 (×10): qty 2

## 2023-10-01 MED ORDER — INSULIN ASPART 100 UNIT/ML IV SOLN: 10.0000 [IU] | Freq: Once | INTRAVENOUS | Status: DC

## 2023-10-01 MED ORDER — DEXMEDETOMIDINE HCL IN NACL 400 MCG/100ML IV SOLN
0.0000 ug/kg/h | INTRAVENOUS | Status: DC
  Administered 2023-10-01: 0.4 ug/kg/h via INTRAVENOUS
  Administered 2023-10-02: 0.5 ug/kg/h via INTRAVENOUS
  Administered 2023-10-02: 0.7 ug/kg/h via INTRAVENOUS
  Administered 2023-10-02: 0.5 ug/kg/h via INTRAVENOUS
  Administered 2023-10-02: 0.4 ug/kg/h via INTRAVENOUS
  Administered 2023-10-03: 0.8 ug/kg/h via INTRAVENOUS
  Administered 2023-10-03 (×2): 0.6 ug/kg/h via INTRAVENOUS
  Administered 2023-10-03: 0.5 ug/kg/h via INTRAVENOUS
  Administered 2023-10-04: 0.6 ug/kg/h via INTRAVENOUS
  Administered 2023-10-04: 0.7 ug/kg/h via INTRAVENOUS
  Filled 2023-10-01 (×6): qty 100
  Filled 2023-10-01: qty 200
  Filled 2023-10-01 (×4): qty 100

## 2023-10-01 MED ORDER — ROCURONIUM BROMIDE 10 MG/ML (PF) SYRINGE
PREFILLED_SYRINGE | INTRAVENOUS | Status: DC | PRN
  Administered 2023-10-01: 60 mg via INTRAVENOUS

## 2023-10-01 MED ORDER — ARFORMOTEROL TARTRATE 15 MCG/2ML IN NEBU
15.0000 ug | INHALATION_SOLUTION | Freq: Two times a day (BID) | RESPIRATORY_TRACT | Status: DC
  Administered 2023-10-01 – 2023-10-06 (×10): 15 ug via RESPIRATORY_TRACT
  Filled 2023-10-01 (×11): qty 2

## 2023-10-01 MED ORDER — BUPIVACAINE-EPINEPHRINE (PF) 0.25% -1:200000 IJ SOLN
INTRAMUSCULAR | Status: AC
  Filled 2023-10-01: qty 30

## 2023-10-01 MED ORDER — FENTANYL CITRATE PF 50 MCG/ML IJ SOSY
25.0000 ug | PREFILLED_SYRINGE | INTRAMUSCULAR | Status: DC | PRN
  Administered 2023-10-02: 50 ug via INTRAVENOUS
  Administered 2023-10-02: 25 ug via INTRAVENOUS
  Administered 2023-10-02: 50 ug via INTRAVENOUS
  Administered 2023-10-02: 100 ug via INTRAVENOUS
  Administered 2023-10-02: 50 ug via INTRAVENOUS
  Filled 2023-10-01: qty 2
  Filled 2023-10-01 (×2): qty 1
  Filled 2023-10-01: qty 2
  Filled 2023-10-01 (×2): qty 1

## 2023-10-01 MED ORDER — ACETAMINOPHEN 10 MG/ML IV SOLN: 1000.0000 mg | Freq: Once | INTRAVENOUS | Status: DC | PRN

## 2023-10-01 MED ORDER — PANTOPRAZOLE SODIUM 40 MG IV SOLR
40.0000 mg | INTRAVENOUS | Status: DC
  Administered 2023-10-01 – 2023-10-05 (×5): 40 mg via INTRAVENOUS
  Filled 2023-10-01 (×5): qty 10

## 2023-10-01 MED ORDER — FENTANYL CITRATE (PF) 100 MCG/2ML IJ SOLN: 25.0000 ug | INTRAMUSCULAR | Status: DC | PRN

## 2023-10-01 MED ORDER — CHLORHEXIDINE GLUCONATE CLOTH 2 % EX PADS
6.0000 | MEDICATED_PAD | Freq: Every day | CUTANEOUS | Status: DC
  Administered 2023-10-02 – 2023-10-11 (×11): 6 via TOPICAL

## 2023-10-01 MED ORDER — PHENYLEPHRINE 80 MCG/ML (10ML) SYRINGE FOR IV PUSH (FOR BLOOD PRESSURE SUPPORT)
PREFILLED_SYRINGE | INTRAVENOUS | Status: DC | PRN
  Administered 2023-10-01 (×2): 80 ug via INTRAVENOUS

## 2023-10-01 MED ORDER — CALCIUM GLUCONATE 10 % IV SOLN: 1.0000 g | Freq: Once | INTRAVENOUS | Status: DC

## 2023-10-01 MED ORDER — ALBUTEROL SULFATE (2.5 MG/3ML) 0.083% IN NEBU: 2.5000 mg | INHALATION_SOLUTION | RESPIRATORY_TRACT | Status: DC | PRN

## 2023-10-01 MED ORDER — SODIUM BICARBONATE 8.4 % IV SOLN
INTRAVENOUS | Status: DC
  Filled 2023-10-01: qty 1000

## 2023-10-01 MED ORDER — PHENYLEPHRINE HCL-NACL 20-0.9 MG/250ML-% IV SOLN
INTRAVENOUS | Status: DC | PRN
  Administered 2023-10-01: 30 ug/min via INTRAVENOUS

## 2023-10-01 MED ORDER — ORAL CARE MOUTH RINSE
15.0000 mL | OROMUCOSAL | Status: DC
  Administered 2023-10-01 – 2023-10-04 (×32): 15 mL via OROMUCOSAL

## 2023-10-01 MED ORDER — DEXAMETHASONE SODIUM PHOSPHATE 10 MG/ML IJ SOLN
INTRAMUSCULAR | Status: DC | PRN
  Administered 2023-10-01: 5 mg via INTRAVENOUS

## 2023-10-01 MED ORDER — SODIUM CHLORIDE 0.9 % IR SOLN
Status: DC | PRN
  Administered 2023-10-01: 1000 mL

## 2023-10-01 SURGICAL SUPPLY — 46 items
BAG COUNTER SPONGE SURGICOUNT (BAG) ×1 IMPLANT
BLADE CLIPPER SURG (BLADE) IMPLANT
CANISTER SUCT 3000ML PPV (MISCELLANEOUS) ×1 IMPLANT
CHLORAPREP W/TINT 26 (MISCELLANEOUS) ×1 IMPLANT
COVER SURGICAL LIGHT HANDLE (MISCELLANEOUS) ×1 IMPLANT
CUTTER FLEX LINEAR 45M (STAPLE) ×1 IMPLANT
DERMABOND ADVANCED .7 DNX12 (GAUZE/BANDAGES/DRESSINGS) ×1 IMPLANT
DRAIN CHANNEL 19F RND (DRAIN) IMPLANT
DRSG TEGADERM 4X4.75 (GAUZE/BANDAGES/DRESSINGS) IMPLANT
ELECT REM PT RETURN 9FT ADLT (ELECTROSURGICAL) ×1
ELECTRODE REM PT RTRN 9FT ADLT (ELECTROSURGICAL) ×1 IMPLANT
EVACUATOR SILICONE 100CC (DRAIN) IMPLANT
GAUZE SPONGE 2X2 STRL 8-PLY (GAUZE/BANDAGES/DRESSINGS) IMPLANT
GLOVE BIOGEL PI MICRO STRL 6 (GLOVE) ×1 IMPLANT
GLOVE INDICATOR 6.5 STRL GRN (GLOVE) ×1 IMPLANT
GOWN STRL REUS W/ TWL LRG LVL3 (GOWN DISPOSABLE) ×3 IMPLANT
GRASPER SUT TROCAR 14GX15 (MISCELLANEOUS) ×1 IMPLANT
IRRIG SUCT STRYKERFLOW 2 WTIP (MISCELLANEOUS) ×1
IRRIGATION SUCT STRKRFLW 2 WTP (MISCELLANEOUS) ×1 IMPLANT
KIT BASIN OR (CUSTOM PROCEDURE TRAY) ×1 IMPLANT
KIT TURNOVER KIT B (KITS) ×1 IMPLANT
L-HOOK LAP DISP 36CM (ELECTROSURGICAL) ×1
LHOOK LAP DISP 36CM (ELECTROSURGICAL) ×1 IMPLANT
NDL INSUFFLATION 14GA 120MM (NEEDLE) ×1 IMPLANT
NEEDLE INSUFFLATION 14GA 120MM (NEEDLE) ×1 IMPLANT
NS IRRIG 1000ML POUR BTL (IV SOLUTION) ×1 IMPLANT
PAD ARMBOARD 7.5X6 YLW CONV (MISCELLANEOUS) ×2 IMPLANT
PENCIL BUTTON HOLSTER BLD 10FT (ELECTRODE) ×1 IMPLANT
RELOAD 45 VASCULAR/THIN (ENDOMECHANICALS) ×2 IMPLANT
RELOAD STAPLE 45 2.5 WHT GRN (ENDOMECHANICALS) IMPLANT
RELOAD STAPLE 45 3.5 BLU ETS (ENDOMECHANICALS) ×2 IMPLANT
RELOAD STAPLE TA45 3.5 REG BLU (ENDOMECHANICALS) ×1 IMPLANT
SCISSORS LAP 5X35 DISP (ENDOMECHANICALS) IMPLANT
SET TUBE SMOKE EVAC HIGH FLOW (TUBING) ×1 IMPLANT
SLEEVE Z-THREAD 5X100MM (TROCAR) ×1 IMPLANT
SUT ETHILON 2 0 FS 18 (SUTURE) IMPLANT
SUT MNCRL AB 4-0 PS2 18 (SUTURE) ×1 IMPLANT
SYS BAG RETRIEVAL 10MM (BASKET) ×1
SYSTEM BAG RETRIEVAL 10MM (BASKET) ×1 IMPLANT
TOWEL GREEN STERILE FF (TOWEL DISPOSABLE) ×1 IMPLANT
TRAY FOLEY W/BAG SLVR 16FR ST (SET/KITS/TRAYS/PACK) ×1 IMPLANT
TRAY LAPAROSCOPIC MC (CUSTOM PROCEDURE TRAY) ×1 IMPLANT
TROCAR Z THREAD OPTICAL 12X100 (TROCAR) ×1 IMPLANT
TROCAR Z-THREAD OPTICAL 5X100M (TROCAR) ×1 IMPLANT
WARMER LAPAROSCOPE (MISCELLANEOUS) ×1 IMPLANT
WATER STERILE IRR 1000ML POUR (IV SOLUTION) ×1 IMPLANT
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
## 2023-10-01 NOTE — Anesthesia Preprocedure Evaluation (Signed)
Anesthesia Evaluation  Patient identified by MRN, date of birth, ID band Patient awake    Reviewed: Allergy & Precautions, NPO status , Patient's Chart, lab work & pertinent test results  History of Anesthesia Complications Negative for: history of anesthetic complications  Airway Mallampati: III  TM Distance: >3 FB Neck ROM: Full  Mouth opening: Limited Mouth Opening  Dental  (+) Dental Advisory Given   Pulmonary shortness of breath, COPD,  oxygen dependent, Current Smoker and Patient abstained from smoking.    + decreased breath sounds      Cardiovascular hypertension, Pt. on medications + CAD and +CHF  + dysrhythmias  Rhythm:Regular   1. Left ventricular systolic function is moderately reduced to 35%-40%.  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 There is mild left ventricular hypertrophy.  Grade 1 diastolic dysfunction.   2. Right ventricular systolic function is normal. The right ventricular  size is normal.   3. The mitral valve is normal in structure. No evidence of mitral valve  regurgitation. No evidence of mitral stenosis.   4. The aortic valve is normal in structure. Aortic valve regurgitation is  not visualized. No aortic stenosis is present.   5. The inferior vena cava is normal in size with greater than 50%  respiratory variability, suggesting right atrial pressure of 3 mmHg.   6. Left atrial size was moderately dilated.     Previously placed Prox LAD to Mid LAD stent (unknown type) is widely patent.  Mid Cx lesion is 99% stenosed.  A drug-eluting stent was successfully placed using a STENT SIERRA 3.00 X 28 MM.  Post intervention, there is a 0% residual stenosis.  Ost 1st Mrg to 1st Mrg lesion is 85% stenosed.  A drug-eluting stent was successfully placed using a STENT RESOLUTE ONYX 3.0X18.  Post intervention, there is a 0% residual stenosis.   1. Successful stenting of the mid LCx with a DES 2. Successful stenting  of the first OM with DES   Plan: DAPT for at least one year. Hydrate. Anticipate DC in am if renal function is stable and no complication.    Neuro/Psych neg Seizures PSYCHIATRIC DISORDERS Anxiety Depression    CVA    GI/Hepatic negative GI ROS, Neg liver ROS,,,  Endo/Other  diabetes    Renal/GU ARF and CRFRenal diseaseLab Results      Component                Value               Date                      NA                       138                 10/01/2023                K                        5.1                 10/01/2023                CO2                      19 (L)  10/01/2023                GLUCOSE                  109 (H)             10/01/2023                BUN                      72 (H)              10/01/2023                CREATININE               4.90 (H)            10/01/2023                CALCIUM                  9.3                 10/01/2023                EGFR                     29.0                03/21/2023                GFRNONAA                 13 (L)              10/01/2023             Male GU complaint     Musculoskeletal negative musculoskeletal ROS (+)    Abdominal   Peds  Hematology Lab Results      Component                Value               Date                      WBC                      14.5 (H)            10/01/2023                HGB                      15.0                10/01/2023                HCT                      44.0                10/01/2023                MCV                      91.4                10/01/2023  PLT                      127 (L)             10/01/2023              Anesthesia Other Findings   Reproductive/Obstetrics                              Anesthesia Physical Anesthesia Plan  ASA: 4  Anesthesia Plan: General   Post-op Pain Management: Ofirmev IV (intra-op)*   Induction: Intravenous and Rapid sequence  PONV Risk Score and Plan: 2  and Ondansetron and Dexamethasone  Airway Management Planned: Oral ETT  Additional Equipment: Arterial line  Intra-op Plan:   Post-operative Plan: Possible Post-op intubation/ventilation  Informed Consent: I have reviewed the patients History and Physical, chart, labs and discussed the procedure including the risks, benefits and alternatives for the proposed anesthesia with the patient or authorized representative who has indicated his/her understanding and acceptance.     Dental advisory given  Plan Discussed with: CRNA  Anesthesia Plan Comments:          Anesthesia Quick Evaluation

## 2023-10-01 NOTE — Op Note (Addendum)
Date of Surgery: 10/01/2023 Admit Date: 09/30/2023 Performing Service: General Surgeons and Role:    Lysle Rubens, MD - Primary  Preoperative Diagnosis: Acute appendicitis  Postoperative Diagnosis: Acute appendicitis  Procedure(s) Performed:  - Foley catheter placement - Diagnostic laparoscopy - Laparoscopic appendectomy  Surgeon:    Donata Duff, MD  Anesthesia:    General endotracheal.  Specimens:  Appendix  Estimated Blood Loss:   Minimal.  Indications for Surgery:   This is a 78 y.o. male  who presented to [place] with a 1 -day history of RLQ pain and nausea.  The patient had a CT scan that showed acute appendicitis with appendicolith without obvious evidence of perforation but small volume free fluid and diffuse bowel and colon inflammation.  Patient high risk surgical candidate so consulted cardiology first for risk stratification. This AM had worsening abdominal pain, leukocytosis and AKI. Given this, felt risks of surgery outweighed benefits. Discussed risks in detail with patient and his sister who consented for surgery.  Operative Findings: Necrotic appendix. Clean base of appendix, cecal tissue appeared healthy. Spillage of appendiceal contents including appendicolith where appendix fell apart when retracted due to necrosis. 19Fr blake drain left in pelvis tracking into right paracolic gutter.  Procedure:  After obtaining consent from the patient, the patient was taken to the operating room and laid supine on the operating table.  The patient was then placed under general endotracheal anesthesia.  A Foley catheter was placed in the usual standard fashion.  The anterior abdominal wall was prepped and draped in the usual standard fashion.  A 5 mm infraumbilical incision was made. The umbilical stalk was grasped with a kocher and a Veress needle was successfully entered into the peritoneal cavity and the peritoneal cavity was insufflated with carbon dioxide gas.  Upon adequate  achievement of pneumoperitoneum, a 5 mm trocar was introduced in optiview fashion using a 5mm 30-degree scope. Diagnostic laparoscopy was performed.  There was no evidence of injury.  Severe inflammation with fibrinous material overlying almost entirety of small bowel and colon in RUQ and RLQ. Inflammation of bowel and colon throughout abdomen.  A suprapubic 5 mm trocar was then placed in the usual standard fashion under direct vision.  A left lower quadrant 12 mm trocar was then placed under direct vision.  The patient was placed in a head down, left side down position.  The cecum was identified; this was lifted up.  The terminal ileum was identified.  The appendix came into full view.  The appendix was then grasped and retracted in a cephalad manner.  The mesoappendix was then clearly visualized.  A window was made in the mesentery at the base of the appendix.  A bowel load stapler was then fired across the base of the appendix.  A vascular load stapler was then fired across the mesoappendix. A second load of vascular stapler was required. The appendix was then placed into an Endocatch bag along with appendicolith.. At this time, we then looked at the pelvis and murky fluid was noted and suctioned. We then turned our attention back to the staple lines and both appeared intact and hemostatic. We copiously irrigated abdomen especially in RLQ and pelvis. There was some fragments of appendicolith noted that were removed with grasper and/or suctioned. The appendix was then removed through the 12 mm port site with the trocar. A 19 Fr blake drain that placed tracking from pelvis along right paracolic gutter exiting through suprapubic port site. Secured with 3-0 nylon. The  12 mm trocar site fascia was closed using 0 Vicryl on a suture passer under direct visualization. The abdomen was desufflated and periumbilical port site removed.   Skin of all trocar sites were closed using 4-0 Monocryl in a subcuticular manner and  Dermabond.  The Foley catheter was maintained. Planned to extubation but patient was persistently acidotic and therefore decision made to keep intubated.  Instrument, sponge, and needle counts were correct at closure and at the conclusion of the case.    Donata Duff, MD [facility] Surgery

## 2023-10-01 NOTE — Transfer of Care (Signed)
Immediate Anesthesia Transfer of Care Note  Patient: Bobby [name]  Procedure(s) Performed: LAPAROSCOPIC APPENDECTOMY (Abdomen)  Patient Location: PACU  Anesthesia Type:General  Level of Consciousness: Patient remains intubated per anesthesia plan  Airway & Oxygen Therapy: Patient remains intubated per anesthesia plan and Patient placed on Ventilator (see vital sign flow sheet for setting)  Post-op Assessment: Report given to RN and Post -op Vital signs reviewed and stable  Post vital signs: Reviewed and stable  Last Vitals:  Vitals Value Taken Time  BP 115/64 10/01/23 1625  Temp    Pulse 85 10/01/23 1629  Resp 18 10/01/23 1629  SpO2 97 % 10/01/23 1629  Vitals shown include unfiled device data.  Last Pain:  Vitals:   10/01/23 1119  TempSrc: Oral  PainSc:          Complications: No notable events documented.

## 2023-10-01 NOTE — Consult Note (Signed)
NAME:  Bobby [name], MRN:  [id number], DOB:  [birth year], LOS: 1 ADMISSION DATE:  09/30/2023, CONSULTATION DATE:  10/01/23 REFERRING MD:  Carlynn Purl, CHIEF COMPLAINT:  post op intubation    History of Present Illness:  Bobby [name] is a 78 year old male with numerous comorbidities (see below) who presented to the [facility] with complaints of RLQ pain, and nausea for one day. He was not vomiting.  CT in the ED showed acute appendicitis with appendicolith. Labs notable for WBC 14.5, potassium 5.5, Creatinine 4.48 (baseline ~2.5), lactic 1.4. He was started on zosyn. He was then admitted to medicine and surgery was consulted.   Surgery recommended lap appy but was concerned about his ability to withstand surgery given his multiple comorbidities. Cardiology was consulted and cleared him for surgery. He was also noted to have intermittent confusion after admission but before surgery.   1/28 he underwent laparoscopic appendectomy. Operative findings included necrotic appendix, a 67f blake drain was left in the pelvis. He was not extubated post op and was noted to be acidotic (ph 7.1) intra-op. CCM was consulted for post-op management.   Pertinent  Medical History  DM2, HTN, CVA, COPD on 4L [place] at baseline, CAD, STEMI 2013/2018 with PCI, HFrEF, CKD4, OSA not compliant with CPAP, A-V block with ICD. Current smoker.   Significant Hospital Events: Including procedures, antibiotic start and stop dates in addition to other pertinent events   1/27 admitted for acute appendicitis. Intermittent confusion. Started zosyn 1/28 lap appy, remained extubated post op  Interim History / Subjective:  Unresponsive laying in bed in PACU s/p lap appy  Objective   Blood pressure (!) 126/99, pulse 88, temperature 97.9 F (36.6 C), temperature source Oral, resp. rate (!) 21, height 5\' 9"  (1.753 m), weight 119.9 kg, SpO2 93%.        Intake/Output Summary (Last 24 hours) at 10/01/2023 1625 Last data filed at 10/01/2023 1529 Gross per  24 hour  Intake 849.36 ml  Output 10 ml  Net 839.36 ml   Filed Weights   09/30/23 1127 10/01/23 1119  Weight: 119.9 kg 119.9 kg    Examination: General: unresponsive, well nourished HENT: ET tube present Lungs: clear bilateral, mechanically ventilated, diminshed on the left. CXR with L effusion  Cardiovascular: paced Abdomen: soft, port sites with skin glue. JP drain with serosanguinous fluid  Extremities: no edema, warm, pulses palpable  Neuro: unresponsive, on sedation s/p anesthesia. Starting to wake up a little  now that sedation down and in ICU. Moving all ext. localizing GU: oliguric, blood tinged urine  Resolved Hospital Problem list   Appendicitis   Assessment & Plan:  Septic shock secondary to acute Appendicitis with peritonitis s/p lap appy 1/28. Prob element of drug related/ sedation related hypotension as well  Presented with abdominal pain, appendicitis on CT. Appears euvolemic Plan Cont zosyn per surgery day 2/x JP drain per surg Norepinephrine map goal >65 Maintain fluid balance CBC, CMP, lactic  Stress dose steroids as below If remains hypotensive may need to repeat echo to evaluate EF Cont tele  Holding his home antihypertensives  nutrition TBD by surgical team   Acute hypoxic & hypercarbic respiratory failure mostly from sedation, basilar atelectasis and can't exclude aspiration event superimposed on  COPD (4 lpm baseline), Current Smoker and OSA Ph 7.17, on 4L [place] at baseline, remains intubated post-op Plan Increased rate to compensate for metabolic acidosis  Wean FIO2 as able D/c propofol, add precedex PAD protocol RASS goal -1 to -2  PRN fent Chest x-ray in AM VAP bundle Add Arformoterol, budesonide, solu-cortef, revefenacin in place of Trelegy ellipta, Albuterol PRN Daily assessment for readiness to wean once metabolic derangements stabilized.   AKI superimposed on CKD IV w/ hyperkalemia and normal anion gap metabolic acidosis.  Baseline  creatinine ~2.5, 4.9 now increased from admission. Potassium 5.5 on admission down to 5.2 now. Oliguric, blood tinged urine. Concerned he will need dialysis given hyperkalemia and acidosis Plan  Repeat renal labs as above Renal dose meds Will check lactate In setting of nagma will change NaCl to bicarb gtt to temporize for now  Will need f/u chem this evening    CAD w/ history of STEMI and PCI, HTN and HFrEF on chronic DOAC for LV thrombus prophylaxis EF 35-40 10/2022. Currently appears euvolemic.  Plan Hold home lasix and eliquis for now Also holding  Cont tele  lipitor, carvedilol, imdur, asa Hold farxiga w/ renal worsening Treat shock   Acute metabolic encephalopathy 2/2 sepsis  Plan Supportive care  Treat shock  Careful w/ sedating meds.   Diabetes Type 2 not on insulin Plan  SSI Goal 140-180   Best Practice (right click and "Reselect all SmartList Selections" daily)   Diet/type: NPO DVT prophylaxis prophylactic heparin  Pressure ulcer(s): N/A GI prophylaxis: PPI Lines: N/A Foley:  Yes, and it is still needed Code Status:  full code Last date of multidisciplinary goals of care discussion [pending]  Labs   CBC: Recent Labs  Lab 09/30/23 0220 10/01/23 0615 10/01/23 1143  WBC 10.1 14.5*  --   NEUTROABS 8.5*  --   --   HGB 14.9 13.8 15.0  HCT 47.8 43.6 44.0  MCV 92.1 91.4  --   PLT 186 127*  --     Basic Metabolic Panel: Recent Labs  Lab 09/30/23 0220 10/01/23 0615 10/01/23 1143  NA 135 139 138  K 4.4 5.5* 5.1  CL 106 106 108  CO2 19* 19*  --   GLUCOSE 92 100* 109*  BUN 43* 67* 72*  CREATININE 2.69* 4.48* 4.90*  CALCIUM 9.8 9.3  --    GFR: Estimated Creatinine Clearance: 16.1 mL/min (A) (by C-G formula based on SCr of 4.9 mg/dL (H)). Recent Labs  Lab 09/30/23 0220 09/30/23 0910 09/30/23 1005 10/01/23 0615  WBC 10.1  --   --  14.5*  LATICACIDVEN  --  1.4 1.4  --     Liver Function Tests: Recent Labs  Lab 09/30/23 0220  AST 20  ALT  20  ALKPHOS 91  BILITOT 1.0  PROT 6.9  ALBUMIN 3.7   Recent Labs  Lab 09/30/23 0220  LIPASE 29   No results for input(s): "AMMONIA" in the last 168 hours.  ABG    Component Value Date/Time   PHART 7.321 (L) 09/08/2021 0330   PCO2ART 43.6 09/08/2021 0330   PO2ART 53 (L) 09/08/2021 0330   HCO3 22.6 09/08/2021 0330   TCO2 21 (L) 10/01/2023 1143   ACIDBASEDEF 4.0 (H) 09/08/2021 0330   O2SAT 85.0 09/08/2021 0330     Coagulation Profile: No results for input(s): "INR", "PROTIME" in the last 168 hours.  Cardiac Enzymes: No results for input(s): "CKTOTAL", "CKMB", "CKMBINDEX", "TROPONINI" in the last 168 hours.  HbA1C: Hgb A1c MFr Bld  Date/Time Value Ref Range Status  09/30/2023 05:22 PM 5.6 4.8 - 5.6 % Final    Comment:    (NOTE) Pre diabetes:          5.7%-6.4%  Diabetes:              >  6.4%  Glycemic control for   <7.0% adults with diabetes   09/07/2021 06:00 AM 5.5 4.8 - 5.6 % Final    Comment:    (NOTE) Pre diabetes:          5.7%-6.4%  Diabetes:              >6.4%  Glycemic control for   <7.0% adults with diabetes     CBG: Recent Labs  Lab 09/30/23 1649 09/30/23 2013 10/01/23 0613 10/01/23 0820 10/01/23 1058  GLUCAP 71 76 92 92 105*    Review of Systems:   Not able   Past Medical History:  He,  has a past medical history of DM (diabetes mellitus), type 2 new diagnosis (08/05/2012), ED (erectile dysfunction), Hyperlipidemia (08/05/2012), Hypertensive heart disease, Panic attack, Pulmonary HTN (HCC), S/P coronary artery stent placement, to RCA Promus DES (08/05/2012), Stroke (HCC), and Tobacco abuse.   Surgical History:   Past Surgical History:  Procedure Laterality Date   BIV ICD INSERTION CRT-D N/A 03/11/2022   Procedure: BIV ICD INSERTION CRT-D;  Surgeon: Regan Lemming, MD;  Location: [facility] [place] INVASIVE CV LAB;  Service: Cardiovascular;  Laterality: N/A;   CARDIAC CATHETERIZATION  08/04/12   PCI to RCA with DES   CORONARY STENT INTERVENTION  N/A 08/12/2017   Procedure: CORONARY STENT INTERVENTION;  Surgeon: [place], Mariene Dickerman M, MD;  Location: [facility] INVASIVE CV LAB;  Service: Cardiovascular;  Laterality: N/A;   CORONARY/GRAFT ACUTE MI REVASCULARIZATION N/A 08/09/2017   Procedure: Coronary/Graft Acute MI Revascularization;  Surgeon: Lyn Records, MD;  Location: [facility] INVASIVE CV LAB;  Service: Cardiovascular;  Laterality: N/A;   LEFT HEART CATH AND CORONARY ANGIOGRAPHY N/A 08/09/2017   Procedure: LEFT HEART CATH AND CORONARY ANGIOGRAPHY;  Surgeon: Lyn Records, MD;  Location: [facility] INVASIVE CV LAB;  Service: Cardiovascular;  Laterality: N/A;   LEFT HEART CATHETERIZATION WITH CORONARY ANGIOGRAM N/A 08/04/2012   Procedure: LEFT HEART CATHETERIZATION WITH CORONARY ANGIOGRAM;  Surgeon: Lennette Bihari, MD;  Location: [facility] CATH LAB;  Service: Cardiovascular;  Laterality: N/A;   LEG SURGERY Left    rod placed for fracture repair   PENILE PROSTHESIS IMPLANT N/A 06/03/2014   Procedure: IMPLANT PENILE PROTHESIS INFLATABLE;  Surgeon: Kathi Ludwig, MD;  Location: [facility] ORS;  Service: Urology;  Laterality: N/A;  with penile block--0.5% marcaine plain   PERCUTANEOUS CORONARY STENT INTERVENTION (PCI-S) Right 08/04/2012   Procedure: PERCUTANEOUS CORONARY STENT INTERVENTION (PCI-S);  Surgeon: Lennette Bihari, MD;  Location: [facility] CATH LAB;  Service: Cardiovascular;  Laterality: Right;   TEMPORARY PACEMAKER N/A 03/08/2022   Procedure: TEMPORARY PACEMAKER;  Surgeon: Lyn Records, MD;  Location: [facility] INVASIVE CV LAB;  Service: Cardiovascular;  Laterality: N/A;   THUMB ARTHROSCOPY Left    thumb joint replaced     Social History:   reports that he has been smoking cigarettes. He has a 15 pack-year smoking history. He has never used smokeless tobacco. He reports that he does not currently use alcohol. He reports that he does not use drugs.   Family History:  His family history includes Heart attack in his paternal grandmother; Heart disease in his maternal grandmother;  Kidney disease in his father; Stomach cancer in his maternal grandfather.   Allergies Allergies  Allergen Reactions   Hydrocodone Itching     Home Medications  Prior to Admission medications   Medication Sig Start Date End Date Taking? Authorizing Provider  acetaminophen (TYLENOL) 325 MG tablet Take 2 tablets (650 mg total) by mouth every  4 (four) hours as needed for headache or mild pain. 03/12/22  Yes Graciella Freer, PA-C  albuterol (PROVENTIL HFA;VENTOLIN HFA) 108 (90 Base) MCG/ACT inhaler Inhale 2 puffs into the lungs every 6 (six) hours as needed for wheezing or shortness of breath. 08/21/17  Yes Bhagat, Bhavinkumar, PA  amLODipine (NORVASC) 5 MG tablet TAKE ONE TABLET BY MOUTH DAILY 07/19/22  Yes Lennette Bihari, MD  apixaban (ELIQUIS) 5 MG TABS tablet Take 1 tablet (5 mg total) by mouth 2 (two) times daily. 03/15/22  Yes Graciella Freer, PA-C  Ascorbic Acid (VITAMIN C PO) Take 1 tablet by mouth daily.   Yes [provider]  aspirin EC 81 MG tablet Take 1 tablet (81 mg total) by mouth daily. Swallow whole. Patient taking differently: Take 81 mg by mouth at bedtime. Swallow whole. 03/15/22  Yes Graciella Freer, PA-C  atorvastatin (LIPITOR) 80 MG tablet Take 1 tablet (80 mg total) by mouth daily at 6 PM. 03/11/18  Yes Lennette Bihari, MD  benzonatate (TESSALON) 100 MG capsule Take 100-200 mg by mouth 3 (three) times daily as needed for cough. 09/21/23  Yes [provider]  buPROPion (WELLBUTRIN XL) 300 MG 24 hr tablet Take 300 mg by mouth daily.   Yes [provider]  carvedilol (COREG) 25 MG tablet TAKE ONE TABLET BY MOUTH TWICE DAILY 06/13/23  Yes Lennette Bihari, MD  clonazePAM (KLONOPIN) 1 MG tablet Take 1 mg by mouth See admin instructions. Take 1mg  (1 tablet) by mouth every night at bedtime and 1mg  (1 tablet) during the day as needed for anxiety. 02/10/22  Yes [provider]  Coenzyme Q10 (COQ10 PO) Take 1 capsule by mouth daily.    Yes [provider]  COLCRYS 0.6 MG tablet Take 0.6 mg by mouth daily as needed (gout flare ups). 03/21/17  Yes [provider]  dapagliflozin propanediol (FARXIGA) 5 MG TABS tablet Take 5 mg by mouth daily. 09/09/23  Yes [provider]  furosemide (LASIX) 20 MG tablet Take 1 tablet (20 mg total) by mouth daily. 08/08/23  Yes Sabharwal, Aditya, DO  hydrALAZINE (APRESOLINE) 50 MG tablet Take 1 tablet (50 mg total) by mouth every 8 (eight) hours. 03/12/22  Yes Graciella Freer, PA-C  isosorbide mononitrate (IMDUR) 30 MG 24 hr tablet Take 1 tablet (30 mg total) by mouth daily. 03/12/22  Yes Graciella Freer, PA-C  multivitamin (RENA-VIT) TABS tablet Take 1 tablet by mouth daily.   Yes [provider]  nitroGLYCERIN (NITROSTAT) 0.4 MG SL tablet DISSOLVE 1 TABLET UNDER THE TONGUE EVERY 5 MINUTES AS NEEDED FOR CHEST PAIN. DO NOT EXCEED A TOTAL OF 3 DOSES IN 15 MINUTES. 07/11/23  Yes Lennette Bihari, MD  Omega-3 Fatty Acids (FISH OIL PO) Take 1 tablet by mouth daily.   Yes [provider]  oxyCODONE-acetaminophen (PERCOCET) 10-325 MG per tablet Take 1 tablet by mouth every 6 (six) hours as needed for pain.   Yes [provider]  predniSONE (DELTASONE) 10 MG tablet Take 10 mg by mouth daily.   Yes [provider]  sertraline (ZOLOFT) 100 MG tablet Take 100 mg by mouth daily.   Yes [provider]  Dwyane Luo 200-62.5-25 MCG/ACT AEPB Inhale 1 puff into the lungs daily. 04/02/22  Yes [provider]  empagliflozin (JARDIANCE) 10 MG TABS tablet Take 1 tablet (10 mg total) by mouth daily before breakfast. Patient not taking: Reported on 10/01/2023 08/08/23   Dorthula Nettles, DO  furosemide (LASIX) 40 MG tablet Take 40 mg by mouth daily. Patient not taking: Reported on 10/01/2023 09/13/23   [provider]  LAGEVRIO 200 MG CAPS capsule Take 4 capsules by mouth 2 (two) times daily. Patient not taking: Reported  on 10/01/2023 09/18/23   [provider]     Critical care time: 55 minutes     Simonne Martinet ACNP-BC [facility] Pulmonary/Critical Care Pager # [phone] OR # [phone] if no answer

## 2023-10-01 NOTE — Anesthesia Postprocedure Evaluation (Signed)
Anesthesia Post Note  Patient: Bobby [name]  Procedure(s) Performed: LAPAROSCOPIC APPENDECTOMY (Abdomen)     Patient location during evaluation: PACU Anesthesia Type: General Level of consciousness: sedated Pain management: pain level controlled Vital Signs Assessment: post-procedure vital signs reviewed and stable Respiratory status: patient on ventilator - see flowsheet for VS Cardiovascular status: blood pressure returned to baseline and stable Postop Assessment: no apparent nausea or vomiting Anesthetic complications: no  No notable events documented.  Last Vitals:  Vitals:   10/01/23 1815 10/01/23 2012  BP:    Pulse: 73   Resp: (!) 23   Temp:  37 C  SpO2: 99%     Last Pain:  Vitals:   10/01/23 2012  TempSrc: Oral  PainSc:                  [name]

## 2023-10-01 NOTE — Progress Notes (Signed)
eLink Physician-Brief Progress Note Patient Name: Bobby [name] DOB: Aug 29, 1946 MRN: [id number]   Date of Service  10/01/2023  HPI/Events of Note  ABG reviewed and improved.  eICU Interventions  No new orders.        Migdalia Dk 10/01/2023, 11:51 PM

## 2023-10-01 NOTE — Anesthesia Procedure Notes (Signed)
Procedure Name: Intubation Date/Time: 10/01/2023 2:28 PM  Performed by: Alease Medina, CRNAPre-anesthesia Checklist: Patient identified, Emergency Drugs available, Suction available and Patient being monitored Patient Re-evaluated:Patient Re-evaluated prior to induction Oxygen Delivery Method: Circle system utilized Preoxygenation: Pre-oxygenation with 100% oxygen Induction Type: IV induction Ventilation: Mask ventilation without difficulty Laryngoscope Size: Mac and 4 Grade View: Grade I Tube type: Oral Tube size: 7.5 mm Number of attempts: 1 Airway Equipment and Method: Stylet and Oral airway Placement Confirmation: ETT inserted through vocal cords under direct vision, positive ETCO2 and breath sounds checked- equal and bilateral Secured at: 23 cm Tube secured with: Tape Dental Injury: Teeth and Oropharynx as per pre-operative assessment

## 2023-10-01 NOTE — Progress Notes (Addendum)
Subjective:   Summary: Bobby [name]. Mackie is a 78 y.o. M with PMH HTN, CVA, CAD, STEMI 2013 and 2018 s/p PCI, HLD, HFrEF, CKD stage 4, OSA, COPD, high-degree AV block s/p CRT-D, who presented with abdominal pain and admitted for acute appendicitis  Patient endorsing worsening abdominal pain this morning. He is intermittently confused, unable to answer complex questions. He is aware of his diagnosis of appendicitis. Attempted a goals of care discussion, unable to get clear answers due to his confusion. No changes to code status.   Objective:  Vital signs in last 24 hours: Vitals:   09/30/23 2119 09/30/23 2209 10/01/23 0022 10/01/23 0442  BP:   (!) 109/53 (!) 118/56  Pulse: 92 87 91 87  Resp:  18 18 18   Temp:   97.7 F (36.5 C) 98.2 F (36.8 C)  TempSrc:   Oral Oral  SpO2: 95% 93% 97% 100%  Weight:      Height:       Supplemental O2: Nasal Cannula SpO2: 100 % O2 Flow Rate (L/min): 5 L/min  Physical Exam:  Constitutional: alert, confused, fatigued, difficult to understand. Difficulty answering complex questions Cardiovascular: RRR Pulmonary/Chest: CTAB Abdominal: tenderness to palpation, particularly RLQ. Guarding Neuro: A&O x 3. Some confusion  Assessment/Plan:   Acute appendicitis with diffuse peritonitis  Continues to be hemodynamically stable and afebrile, now with increased leukocytosis to 14.5. Abdominal exam worsened today with diffuse abdominal pain worst in RLQ and guarding. Per surgery, unlikely he will be able to be managed nonoperatively. Per cardiology, he is a moderate-risk surgical candidate but can proceed with surgery without further cardiac workup.  - Laparoscopic appendectomy today, possible open - On Zosyn, day 2 - IV dilaudid 1mg  q4h prn, will transition to PO when able  AKI on CKD IV Creatinine 4.48 today from 2.69 yesterday which is approximately his baseline. Bladder scan ordered, however per nursing report he had just urinated in his  bed. AKI possibly related to NPO status and acute infection. Will monitor BMP closely and possibly get nephrology consult when patient returns from OR.   Hyperkalemia K elevated at 5.5 today. EKG, insulin and dextrose, calcium ordered however patient was already being transferred to the OR. Anesthesia called and is aware, will monitor electrolytes closely after surgery today.   HLD Hx CVA Hx multiple CVAs Hx high-degree AV block s/p CRT-D Evaluated by cardiology who determined his many cardiac comorbidities make him a high risk surgical candidate but feel he is stable, cleared for surgery. No further ischemic workup indicated.  - Holding home statin, Eliquis, ASA while NPO and in preparation of surgery  COPD on home oxygen OSA on CPAP Patient does not know how much oxygen he uses at home. Currently requiring 4-5L. Did not want his CPAP last night.  - Wean O2 with goal 88-92% - On Dulera, Incruse inhalers - CPAP overnight  HFrEF, EF 35-40% Mildly hypovolemic and hypotensive on exam.  - Holding home Jardiance and Lasix while NPO and mildly hypotensive and hypovolemic  HTN Mildly hypotensive since arrival.  - Hold home amlodipine, carvedilol, hydralazine, Imdur  Hyperglycemia A1c 5.5 two years ago. On SSI  Anxiety Depression Holding home sertraline, clonazepam, buproprion while NPO  Chronic back pain Per chart review is on disability for chronic back pain. Takes oxycodone-acetaminophen at home.  - IV dilaudid 1mg  q4h prn while NPO  Diet: NPO VTE: Heparin Code: Full  Dispo: Anticipated discharge to Home pending medical stability and PT evaluation.   Monna Fam, MD PGY-1 Internal Medicine Resident Pager Number [phone] Please contact the on call pager after 5 pm and on weekends at [phone].

## 2023-10-01 NOTE — Anesthesia Procedure Notes (Signed)
Arterial Line Insertion Start/End1/28/2025 2:11 PM, 10/01/2023 2:16 PM Performed by: Val Eagle, MD, anesthesiologist  Patient location: OR. Preanesthetic checklist: patient identified, IV checked, risks and benefits discussed, surgical consent, monitors and equipment checked, pre-op evaluation, timeout performed and anesthesia consent Right, radial was placed Catheter size: 20 G Hand hygiene performed  and maximum sterile barriers used   Attempts: 1 Procedure performed without using ultrasound guided technique. Following insertion, dressing applied. Post procedure assessment: normal and unchanged  Patient tolerated the procedure well with no immediate complications.

## 2023-10-01 NOTE — Progress Notes (Signed)
Progress Note     Subjective: Alert and oriented to self, place and situation. Thinks the year is 2004. Having more abdominal pain this am. Denies n/v. Denies SHOB. States his sister is his next of kin and is agreeable to her being part of medical decision making discussions   Objective: Vital signs in last 24 hours: Temp:  [97.6 F (36.4 C)-98.3 F (36.8 C)] 98.2 F (36.8 C) (01/28 0442) Pulse Rate:  [78-99] 87 (01/28 0442) Resp:  [17-24] 18 (01/28 0442) BP: (105-123)/(45-67) 118/56 (01/28 0442) SpO2:  [82 %-100 %] 100 % (01/28 0442) Weight:  [119.9 kg] 119.9 kg (01/27 1127)    Intake/Output from previous day: 01/27 0701 - 01/28 0700 In: 99.4 [IV Piggyback:99.4] Out: -  Intake/Output this shift: No intake/output data recorded.  PE: General: pleasant, WD, chronically ill appearing male who is laying in bed in NAD Heart: regular, rate, and rhythm Lungs: Respiratory effort nonlabored on 5 lpm [place] Abd: soft, distension difficult to assess secondary to body habitus. Diffuse abdominal TTP with voluntary guarding - pain greatest in RLQ Skin: warm and dry Psych: A&Ox3 with an appropriate affect.    Lab Results:  Recent Labs    09/30/23 0220 10/01/23 0615  WBC 10.1 14.5*  HGB 14.9 13.8  HCT 47.8 43.6  PLT 186 127*   BMET Recent Labs    09/30/23 0220 10/01/23 0615  NA 135 139  K 4.4 5.5*  CL 106 106  CO2 19* 19*  GLUCOSE 92 100*  BUN 43* 67*  CREATININE 2.69* 4.48*  CALCIUM 9.8 9.3   PT/INR No results for input(s): "LABPROT", "INR" in the last 72 hours. CMP     Component Value Date/Time   NA 139 10/01/2023 0615   NA 140 01/11/2023 1332   K 5.5 (H) 10/01/2023 0615   CL 106 10/01/2023 0615   CO2 19 (L) 10/01/2023 0615   GLUCOSE 100 (H) 10/01/2023 0615   BUN 67 (H) 10/01/2023 0615   BUN 55 (H) 01/11/2023 1332   CREATININE 4.48 (H) 10/01/2023 0615   CREATININE 1.66 (H) 08/02/2016 1028   CALCIUM 9.3 10/01/2023 0615   PROT 6.9 09/30/2023 0220   PROT  6.5 01/11/2023 1332   ALBUMIN 3.7 09/30/2023 0220   ALBUMIN 4.4 01/11/2023 1332   AST 20 09/30/2023 0220   ALT 20 09/30/2023 0220   ALKPHOS 91 09/30/2023 0220   BILITOT 1.0 09/30/2023 0220   BILITOT 0.2 01/11/2023 1332   GFRNONAA 13 (L) 10/01/2023 0615   GFRAA 32 (L) 11/05/2019 1413   Lipase     Component Value Date/Time   LIPASE 29 09/30/2023 0220       Studies/Results: CT ABDOMEN PELVIS WO CONTRAST Result Date: 09/30/2023 CLINICAL DATA:  78 year old male with abdominal pain. EXAM: CT ABDOMEN AND PELVIS WITHOUT CONTRAST TECHNIQUE: Multidetector CT imaging of the abdomen and pelvis was performed following the standard protocol without IV contrast. RADIATION DOSE REDUCTION: This exam was performed according to the departmental dose-optimization program which includes automated exposure control, adjustment of the mA and/or kV according to patient size and/or use of iterative reconstruction technique. COMPARISON:  Chest CT 08/30/2023. FINDINGS: Lower chest: Stable cardiomegaly, partially visible cardiac pacemaker. No pericardial effusion. Patchy and confluent new bibasilar lung opacity most resembles atelectasis affecting the lingula and both costophrenic angles. No pleural effusion. Hepatobiliary: Negative noncontrast liver and gallbladder. Pancreas: Negative. Spleen: Negative. Adrenals/Urinary Tract: Bilateral adrenal gland thickening compatible with hyperplasia. Nonobstructed kidneys with multiple bilateral renal cysts, the largest have simple  fluid density (no follow-up imaging recommended). No nephrolithiasis. Extensive but symmetric and nonspecific bilateral pararenal space fat stranding as seen on coronal image 60. Ureters are decompressed to the bladder. Decompressed and unremarkable urinary bladder. Stomach/Bowel: Decompressed and unremarkable rectum. Thick-walled sigmoid colon at the right pelvic inlet along a segment of about 7 cm (series 3, image 70). Wall thickening continues  proximally but is less pronounced. Superimposed diverticulosis in the more proximal sigmoid colon (series 3, image 75). Retained stool in the descending colon which has a more normal appearance. Retained gas and stool in the transverse colon which appears negative. Ascending colon is decompressed. Right lower quadrant inflammation at the tip of the cecum (series 3, image 71) is surrounding and dependent adjacent to abnormal appendix. Appendix: Location: Medial to the tip of the cecum coronal image 77 Diameter: 14 mm Appendicolith: Positive (image 77) Mucosal hyper-enhancement: Noncontrast exam. Extraluminal gas: Negative. Periappendiceal collection: Regional inflammation and perhaps trace free fluid (series 3, image 68). Small bowel loops in the lower abdomen are widely irregular but decompressed. Retained fluid in the stomach. Decompressed duodenum. No free intraperitoneal air. No dilated small bowel. Small volume of free fluid in both gutters. Vascular/Lymphatic: Aortoiliac calcified atherosclerosis. Normal caliber abdominal aorta. Vascular patency is not evaluated in the absence of IV contrast. No lymphadenopathy identified. Reproductive: Penile implant. Other: No pelvis free fluid. Musculoskeletal: Advanced lower thoracic disc and endplate degeneration. No acute osseous abnormality identified. IMPRESSION: 1. Acute Appendicitis with appendicolith. No pneumoperitoneum to indicate perforation, and only a small volume of free fluid, but appearance of Widespread Peritonitis with extensive small bowel and multiple large bowel segments appearing actively inflamed. 2. No other acute finding; Lung base atelectasis. Nonspecific bilateral pararenal space inflammation without obstructive uropathy. Aortic Atherosclerosis (ICD10-I70.0). Electronically Signed   By: Odessa Fleming M.D.   On: 09/30/2023 05:58    Anti-infectives: Anti-infectives (From admission, onward)    Start     Dose/Rate Route Frequency Ordered Stop    09/30/23 1800  piperacillin-tazobactam (ZOSYN) IVPB 3.375 g        3.375 g 12.5 mL/hr over 240 Minutes Intravenous Every 8 hours 09/30/23 1723     09/30/23 0800  piperacillin-tazobactam (ZOSYN) IVPB 3.375 g        3.375 g 100 mL/hr over 30 Minutes Intravenous  Once 09/30/23 0742 09/30/23 0959        Assessment/Plan  Acute appendicitis  - WBC up to 14.5 despite IV abx and ongoing significant abdominal pain. AKI with cr >4 - cardiology has cleared for OR with moderate risk without further work up as cardiac issues appear at baseline  Given abdominal exam and worsening leukocytosis on IV abx recommend OR today for definitive management. We discussed risks and benefits including risks for conversion to open procedure and possibility of need for ileocecectomy  Other risks include but are not limited to bleeding, infection, wound problems, anesthesia, injury to intra-abdominal organs, possibility of postoperative ileus. He  seems to understand and agrees with the plan.   Dr. Azucena Cecil has discussed plan with his sister Earline Mayotte   FEN - NPO, IVF per primary VTE - SCDs, okay for chemical prophylaxis or therapeutic heparin GTT or Lovenox.  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    LOS: 1 day   Eric Form, [facility] 10/01/2023, 8:10 AM Please see Amion for pager number during day hours 7:00am-4:30pm

## 2023-10-01 NOTE — Plan of Care (Signed)
  Problem: Education: Goal: Ability to describe self-care measures that may prevent or decrease complications (Diabetes Survival Skills Education) will improve Outcome: Progressing   Problem: Metabolic: Goal: Ability to maintain appropriate glucose levels will improve Outcome: Progressing   Problem: Skin Integrity: Goal: Risk for impaired skin integrity will decrease Outcome: Progressing   Problem: Tissue Perfusion: Goal: Adequacy of tissue perfusion will improve Outcome: Progressing

## 2023-10-02 ENCOUNTER — Encounter (HOSPITAL_COMMUNITY): Payer: Self-pay | Admitting: General Surgery

## 2023-10-02 ENCOUNTER — Inpatient Hospital Stay (HOSPITAL_COMMUNITY): Payer: 59

## 2023-10-02 DIAGNOSIS — R6521 Severe sepsis with septic shock: Secondary | ICD-10-CM | POA: Diagnosis not present

## 2023-10-02 DIAGNOSIS — K35209 Acute appendicitis with generalized peritonitis, without abscess, unspecified as to perforation: Secondary | ICD-10-CM | POA: Diagnosis not present

## 2023-10-02 DIAGNOSIS — J9601 Acute respiratory failure with hypoxia: Secondary | ICD-10-CM | POA: Diagnosis not present

## 2023-10-02 DIAGNOSIS — A419 Sepsis, unspecified organism: Secondary | ICD-10-CM | POA: Diagnosis not present

## 2023-10-02 LAB — MAGNESIUM: Magnesium: 2.2 mg/dL (ref 1.7–2.4)

## 2023-10-02 LAB — CBC
HCT: 37.5 % — ABNORMAL LOW (ref 39.0–52.0)
Hemoglobin: 12.3 g/dL — ABNORMAL LOW (ref 13.0–17.0)
MCH: 29.1 pg (ref 26.0–34.0)
MCHC: 32.8 g/dL (ref 30.0–36.0)
MCV: 88.7 fL (ref 80.0–100.0)
Platelets: 128 10*3/uL — ABNORMAL LOW (ref 150–400)
RBC: 4.23 MIL/uL (ref 4.22–5.81)
RDW: 14.6 % (ref 11.5–15.5)
WBC: 12.8 10*3/uL — ABNORMAL HIGH (ref 4.0–10.5)
nRBC: 0 % (ref 0.0–0.2)

## 2023-10-02 LAB — GLUCOSE, CAPILLARY
Glucose-Capillary: 120 mg/dL — ABNORMAL HIGH (ref 70–99)
Glucose-Capillary: 137 mg/dL — ABNORMAL HIGH (ref 70–99)
Glucose-Capillary: 138 mg/dL — ABNORMAL HIGH (ref 70–99)
Glucose-Capillary: 144 mg/dL — ABNORMAL HIGH (ref 70–99)
Glucose-Capillary: 145 mg/dL — ABNORMAL HIGH (ref 70–99)
Glucose-Capillary: 147 mg/dL — ABNORMAL HIGH (ref 70–99)

## 2023-10-02 LAB — POCT I-STAT 7, (LYTES, BLD GAS, ICA,H+H)
Acid-base deficit: 10 mmol/L — ABNORMAL HIGH (ref 0.0–2.0)
Acid-base deficit: 8 mmol/L — ABNORMAL HIGH (ref 0.0–2.0)
Bicarbonate: 19.4 mmol/L — ABNORMAL LOW (ref 20.0–28.0)
Bicarbonate: 18 mmol/L — ABNORMAL LOW (ref 20.0–28.0)
Calcium, Ion: 1.21 mmol/L (ref 1.15–1.40)
Calcium, Ion: 1.23 mmol/L (ref 1.15–1.40)
HCT: 38 % — ABNORMAL LOW (ref 39.0–52.0)
HCT: 37 % — ABNORMAL LOW (ref 39.0–52.0)
Hemoglobin: 12.9 g/dL — ABNORMAL LOW (ref 13.0–17.0)
Hemoglobin: 12.6 g/dL — ABNORMAL LOW (ref 13.0–17.0)
O2 Saturation: 97 %
O2 Saturation: 95 %
Patient temperature: 36.7
Patient temperature: 99.1
Potassium: 5.2 mmol/L — ABNORMAL HIGH (ref 3.5–5.1)
Potassium: 4.9 mmol/L (ref 3.5–5.1)
Sodium: 138 mmol/L (ref 135–145)
Sodium: 137 mmol/L (ref 135–145)
TCO2: 21 mmol/L — ABNORMAL LOW (ref 22–32)
TCO2: 19 mmol/L — ABNORMAL LOW (ref 22–32)
pCO2 arterial: 53.6 mm[Hg] — ABNORMAL HIGH (ref 32–48)
pCO2 arterial: 38.6 mm[Hg] (ref 32–48)
pH, Arterial: 7.165 — CL (ref 7.35–7.45)
pH, Arterial: 7.279 — ABNORMAL LOW (ref 7.35–7.45)
pO2, Arterial: 116 mm[Hg] — ABNORMAL HIGH (ref 83–108)
pO2, Arterial: 86 mm[Hg] (ref 83–108)

## 2023-10-02 LAB — BASIC METABOLIC PANEL
Anion gap: 14 (ref 5–15)
Anion gap: 15 (ref 5–15)
BUN: 84 mg/dL — ABNORMAL HIGH (ref 8–23)
BUN: 96 mg/dL — ABNORMAL HIGH (ref 8–23)
CO2: 16 mmol/L — ABNORMAL LOW (ref 22–32)
CO2: 17 mmol/L — ABNORMAL LOW (ref 22–32)
Calcium: 9.1 mg/dL (ref 8.9–10.3)
Calcium: 9.8 mg/dL (ref 8.9–10.3)
Chloride: 104 mmol/L (ref 98–111)
Chloride: 107 mmol/L (ref 98–111)
Creatinine, Ser: 4.95 mg/dL — ABNORMAL HIGH (ref 0.61–1.24)
Creatinine, Ser: 4.65 mg/dL — ABNORMAL HIGH (ref 0.61–1.24)
GFR, Estimated: 11 mL/min — ABNORMAL LOW (ref >60)
GFR, Estimated: 12 mL/min — ABNORMAL LOW (ref >60)
Glucose, Bld: 161 mg/dL — ABNORMAL HIGH (ref 70–99)
Glucose, Bld: 157 mg/dL — ABNORMAL HIGH (ref 70–99)
Potassium: 5.1 mmol/L (ref 3.5–5.1)
Potassium: 4.1 mmol/L (ref 3.5–5.1)
Sodium: 134 mmol/L — ABNORMAL LOW (ref 135–145)
Sodium: 139 mmol/L (ref 135–145)

## 2023-10-02 LAB — SURGICAL PATHOLOGY

## 2023-10-02 MED ORDER — VITAL 1.5 CAL PO LIQD
1000.0000 mL | ORAL | Status: DC
Start: 2023-10-02 — End: 2023-10-05
  Administered 2023-10-02 – 2023-10-04 (×2): 1000 mL
  Filled 2023-10-02: qty 1000

## 2023-10-02 MED ORDER — LACTATED RINGERS IV SOLN: INTRAVENOUS | Status: AC

## 2023-10-02 MED ORDER — PROSOURCE TF20 ENFIT COMPATIBL EN LIQD
60.0000 mL | Freq: Two times a day (BID) | ENTERAL | Status: DC
  Administered 2023-10-02 – 2023-10-04 (×4): 60 mL
  Filled 2023-10-02 (×5): qty 60

## 2023-10-02 MED ORDER — PIPERACILLIN-TAZOBACTAM IN DEX 2-0.25 GM/50ML IV SOLN
2.2500 g | Freq: Three times a day (TID) | INTRAVENOUS | Status: DC
  Administered 2023-10-02 – 2023-10-08 (×18): 2.25 g via INTRAVENOUS
  Filled 2023-10-02 (×20): qty 50

## 2023-10-02 NOTE — Progress Notes (Signed)
Initial Nutrition Assessment  DOCUMENTATION CODES:  Obesity unspecified  INTERVENTION:  Initiate tube feeding via cortrak tube: Vital 1.5 at 55 ml/h (1320 ml per day) Hold at trickle rate of 15 to monitor for tolerance. Prosource TF20 60 ml BID Provides 2140 kcal, 129 gm protein, 1008 ml free water daily  NUTRITION DIAGNOSIS:   Inadequate oral intake related to inability to eat as evidenced by NPO status.  GOAL:   Patient will meet greater than or equal to 90% of their needs  MONITOR:   TF tolerance, I & O's, Vent status, Labs  REASON FOR ASSESSMENT:  Ventilator, Consult Enteral/tube feeding initiation and management  ASSESSMENT:  Pt with hx of HLD, HTN, COPD on 4L home O2, CKD IV, prior CVA, CHF, CAD s/p PCI, and DM type 2 presented to ED with abdominal pain. Found to have acute appendicitis and taken emergently to OR.  1/27 - presented to ED 1/28 - laparoscopic appendectomy, left intubated after surgery for metabolic acidemia  Patient is currently intubated on ventilator support. Awake and able to nod head to some answers. Daughter at bedside able to provide some hx. Reports that prior to acute illness, appetite and intake have always been good. No weight loss - if anything pt may have gained weight overtime.    Discussed in rounds, trickle feeds to be started. Would recommend monitoring abdominal distention and bowel sounds to ensure tolerance. Per MD, pt to be weaned from vent once acidosis has improved.   MV: 12.9 L/min Temp (24hrs), Avg:98.8 F (37.1 C), Min:98.2 F (36.8 C), Max:99.5 F (37.5 C) MAP (art line) 95 mmHg  Admit weight: 119.9 kg  Current weight: 121 kg   Intake/Output Summary (Last 24 hours) at 10/02/2023 1309 Last data filed at 10/02/2023 1100 Gross per 24 hour  Intake 2520.04 ml  Output 410 ml  Net 2110.04 ml  Net IO Since Admission: 2,209.4 mL [10/02/23 1309]  Drains/Lines: Art Line UOP JP Drain 19 Fr: 70mL  Average Meal  Intake: 1/27-1/28: 0% intake x 2 recorded meals  Nutritionally Relevant Medications: Scheduled Meds:  calcium gluconate  1 g Intravenous Once   hydrocortisone sod succinate   100 mg Intravenous Q12H   insulin aspart  0-20 Units Subcutaneous Q4H   pantoprazole IV  40 mg Intravenous Q24H   Continuous Infusions:  norepinephrine (LEVOPHED) Adult infusion     piperacillin-tazobactam (ZOSYN)  IV 12.5 mL/hr at 10/02/23 0600   sodium bicarbonate 150 mEq in dextrose 5 % 1,150 mL infusion 50 mL/hr at 10/02/23 0600   Labs Reviewed: BUN 84, creatinine 4.95 CBG ranges from 92-153 mg/dL over the last 24 hours HgbA1c 5.6%  NUTRITION - FOCUSED PHYSICAL EXAM: Flowsheet Row Most Recent Value  Orbital Region No depletion  Upper Arm Region No depletion  Thoracic and Lumbar Region No depletion  Buccal Region No depletion  Temple Region No depletion  Clavicle Bone Region No depletion  Clavicle and Acromion Bone Region No depletion  Scapular Bone Region No depletion  Dorsal Hand No depletion  Patellar Region No depletion  Anterior Thigh Region No depletion  Posterior Calf Region No depletion  Edema (RD Assessment) Mild  Hair Reviewed  Eyes Reviewed  Mouth Reviewed  Skin Reviewed  Nails Reviewed   Diet Order:   Diet Order             Diet NPO time specified Except for: Ice Chips, Sips with Meds  Diet effective now  EDUCATION NEEDS:  Education needs have been addressed  Skin:  Skin Assessment: Reviewed RN Assessment  Last BM:     Height:  Ht Readings from Last 1 Encounters:  10/01/23 5' 9.02" (1.753 m)    Weight:  Wt Readings from Last 1 Encounters:  10/02/23 121 kg    Ideal Body Weight:  72.7 kg  BMI:  Body mass index is 39.37 kg/m.  Estimated Nutritional Needs:  Kcal:  2000-2300 kcal/d Protein:  110-125g/d Fluid:  2L/d    Greig Castilla, RD, LDN Registered Dietitian II Please reach out via secure chat Weekend on-call pager # available in  [facility]

## 2023-10-02 NOTE — Procedures (Signed)
Cortrak  Person Inserting Tube:  Osa Craver, RD Tube Type:  Cortrak - 43 inches Tube Size:  10 Tube Location:  Left nare Secured by: Bridle Technique Used to Measure Tube Placement:  Marking at nare/corner of mouth Cortrak Secured At:  67 cm   Cortrak Tube Team Note:  Consult received to place a Cortrak feeding tube.   X-ray is required, abdominal x-ray has been ordered by the Cortrak team. Please confirm tube placement before using the Cortrak tube.   If the tube becomes dislodged please keep the tube and contact the Cortrak team at [email or web address] for replacement.  If after hours and replacement cannot be delayed, place a NG tube and confirm placement with an abdominal x-ray.    Romelle Starcher MS, RDN, LDN, CNSC Registered Dietitian 3 Clinical Nutrition RD Inpatient Contact Info in Amion

## 2023-10-02 NOTE — Progress Notes (Signed)
  Daily Progress Note  Assessment:    Bobby [name] is an 78 y.o. male with HTN, CVA, CAD, STEMI 2013, 2018 s/p PCI, HLD, HFrEF, CKD4, OSA, COPD, high degree AV block s/p CRT-D who presented with acute appendicitis w appendicolith  Procedures/Events: [09/30/23] Admission, cardiology consult--moderate risk [10/01/23] Laparoscopic appendectomy, post-op ICU admission, not extubated  Consults: PCCM  Subjective/Interval:    Patient intubated and sedated in ICU  Objective:  Vital signs for last 24 hours: Temp:  [97.9 F (36.6 C)-99.2 F (37.3 C)] 99.1 F (37.3 C) (01/29 0348) Pulse Rate:  [70-91] 76 (01/29 0400) Resp:  [4-27] 23 (01/29 0400) BP: (105-164)/(43-99) 141/63 (01/29 0400) SpO2:  [92 %-99 %] 98 % (01/29 0400) Arterial Line BP: (90-160)/(41-69) 134/64 (01/29 0400) FiO2 (%):  [40 %-50 %] 40 % (01/29 0747) Weight:  [118.2 kg-121 kg] 121 kg (01/29 0353)  Labs: Notable for worsening creatinine, acid base deficit, acidosis improved from yesterday but still quite acidotic. Improving leukocytosis  . I have personally reviewed all labs for past 24h.  Imaging: No new imaging. I have personally reviewed all new imaging  I/Os (past 24h): Drain: 70  Physical Exam:  Gen: Critically ill elderly male, intubated at sedation HEENT: ETT in place Resp: Full vent support Cardiovascular: Paced Abdomen: Distended, tender to palpation on right, appropraite post-op, drain with SS output Ext: no peripheral edema Neuro: Sedated   Plan:   - Continue IV antibiotics for minimum 7 days given degree of necrosis and contamination in OR. - Appreciate CCM management - Continue JP drain to bulb suction, high risk for post operative abscess - Consider OGT/NG if becomes more distended. High risk for post-operative ileus given degree of inflammation diffusely seen intra-op   LOS: 2 days   I reviewed hospitalist notes, last 24 h vitals and pain scores, last 48 h intake and output, last 24 h labs  and trends, and last 24 h imaging results.  This required moderate level of medical decision making  Donata Duff, MD [facility] 10/02/2023  *Care during the described time interval was provided by me and/or other providers on the critical care team.  I have reviewed this patient's available data, including medical history, events of note, physical examination and test results as part of my evaluation.

## 2023-10-02 NOTE — Progress Notes (Signed)
PHARMACY NOTE:  ANTIMICROBIAL RENAL DOSAGE ADJUSTMENT  Current antimicrobial regimen includes a mismatch between antimicrobial dosage and estimated renal function.  As per policy approved by the Pharmacy & Therapeutics and Medical Executive Committees, the antimicrobial dosage will be adjusted accordingly.  Current antimicrobial dosage:  Zosyn 3.375 gm IV every 8 hours  Indication: Acute appendicitis with peritonitis s/p appendectomy  Renal Function:  Estimated Creatinine Clearance: 16.1 mL/min (A) (by C-G formula based on SCr of 4.95 mg/dL (H)). []      On intermittent HD, scheduled: []      On CRRT    Antimicrobial dosage has been changed to:  Zosyn 2.25 gm IV every 8 hours  Thank you for allowing pharmacy to be a part of this patient's care.  Enos Fling, PharmD PGY-1 Acute Care Pharmacy Resident 10/02/2023 9:12 AM

## 2023-10-02 NOTE — Plan of Care (Signed)
  Problem: Fluid Volume: Goal: Ability to maintain a balanced intake and output will improve Outcome: Progressing   Problem: Metabolic: Goal: Ability to maintain appropriate glucose levels will improve Outcome: Progressing   Problem: Skin Integrity: Goal: Risk for impaired skin integrity will decrease Outcome: Progressing   Problem: Nutritional: Goal: Maintenance of adequate nutrition will improve Outcome: Not Progressing   Problem: Nutrition: Goal: Adequate nutrition will be maintained Outcome: Not Progressing

## 2023-10-02 NOTE — Progress Notes (Signed)
NAME:  Bobby [name], MRN:  [id number], DOB:  Oct 25, 1945, LOS: 2 ADMISSION DATE:  09/30/2023, CONSULTATION DATE:  10/01/23 REFERRING MD:  Carlynn Purl, CHIEF COMPLAINT:  post op intubation  History of Present Illness:  Mr. Staples is a 78 year old male with numerous comorbidities (see below) who presented to the [facility] with complaints of RLQ pain, and nausea for one day. He was not vomiting.  CT in the ED showed acute appendicitis with appendicolith. Labs notable for WBC 14.5, potassium 5.5, Creatinine 4.48 (baseline ~2.5), lactic 1.4. He was started on zosyn. He was then admitted to medicine and surgery was consulted.    Surgery recommended lap appy but was concerned about his ability to withstand surgery given his multiple comorbidities. Cardiology was consulted and cleared him for surgery. He was also noted to have intermittent confusion after admission but before surgery.    1/28 he underwent laparoscopic appendectomy. Operative findings included necrotic appendix, a 41f blake drain was left in the pelvis. He was not extubated post op and was noted to be acidotic (ph 7.1) intra-op. CCM was consulted for post-op management.   Pertinent  Medical History  DM2, HTN, CVA, COPD on 4L [place] at baseline, CAD, STEMI 2013/2018 with PCI, HFrEF, CKD4, OSA not compliant with CPAP, A-V block with ICD. Current smoker.   Significant Hospital Events: Including procedures, antibiotic start and stop dates in addition to other pertinent events   1/27 admitted for acute appendicitis. Intermittent confusion. Started zosyn 1/28 lap appy, remained extubated post op  Interim History / Subjective:  Remains intubated. Awakes to stimulation. Responds to commands. Reports that he would like extubation and also that he feels slightly dyspneic.   Objective   Blood pressure (!) 141/63, pulse 76, temperature 99.1 F (37.3 C), temperature source Oral, resp. rate (!) 23, height 5' 9.02" (1.753 m), weight 121 kg, SpO2 98%.    Vent Mode:  PRVC FiO2 (%):  [50 %] 50 % Set Rate:  [18 bmp-23 bmp] 23 bmp Vt Set:  [560 mL] 560 mL PEEP:  [5 cmH20] 5 cmH20 Plateau Pressure:  [14 cmH20-16 cmH20] 14 cmH20   Intake/Output Summary (Last 24 hours) at 10/02/2023 0641 Last data filed at 10/02/2023 0600 Gross per 24 hour  Intake 2248.21 ml  Output 410 ml  Net 1838.21 ml   Filed Weights   10/01/23 1119 10/01/23 1710 10/02/23 0353  Weight: 119.9 kg 118.2 kg 121 kg    Examination: General: Sedated and intubated but responds to prompts HENT: ET tube present Lungs: trace rales, mechanically ventilated, diminshed on the left. CXR with L effusion  Cardiovascular: paced Abdomen: soft, port sites with skin glue. JP drain with serosanguinous fluid  Extremities: no edema, warm, pulses palpable  Neuro: Awakens and follows commands, falls asleep quickly. Moving all ext. localizing GU: Some output of clear yellow urine  Resolved Hospital Problem list   Appendicitis   Assessment & Plan:  Septic shock secondary to acute Appendicitis with peritonitis s/p lap appy 1/28.  Resolving. Off pressors. In fact, blood pressures borderline high. Remains sedated but awakes to follow commands. Possible drug/sedation related hypotension is resolving as well. Euvolemic. -Cont zosyn per surgery day 3/x -JP drain per surg -Will discontinue steroids as off pressors -Follow blood cultures (no growth), CBC, CMP, lactic (not elevated) -Nutrition TBD by surgical team    Acute hypoxic & hypercarbic respiratory failure mostly from sedation, basilar atelectasis and can't exclude aspiration event superimposed on  COPD (4 lpm baseline), Current Smoker and  OSA Remains intubated but support is being minimized. May tolerate extubation pending further improvement in his acidosis, but will proceed slowly/ evaluate throughout day. -Increased rate to compensate for metabolic acidosis, Wean FIO2 as able -Precedex PAD protocol RASS goal -1 to -2, PRN fent -Chest x-ray stable  small L pleural effusion -VAP bundle -Continue Arformoterol, budesonide, solu-cortef, revefenacin in place of Trelegy ellipta, Albuterol PRN   AKI superimposed on CKD IV w/ hyperkalemia and normal anion gap metabolic acidosis.  Baseline creatinine ~2.5, now 4.9, stable from yesterday. Potassium 5.1, also stable. Urine output appears to be picking up slowly.  -Continue monitoring K and urine output, concerned he will need dialysis given hyperkalemia and acidosis -Stop Bicarb given improving acidosis -afternoon BMP    CAD w/ history of STEMI and PCI, HTN and HFrEF on chronic DOAC for LV thrombus prophylaxis EF 35-40 10/2022. Currently appears euvolemic.  -Hold home lasix and eliquis for now, also lipitor, carvedilol, imdur, asa, farxiga -Volume status exams   Acute metabolic encephalopathy 2/2 sepsis  Awakes spontaneously and to prompts. Continue supportive care, treat shock.   Diabetes Type 2 not on insulin SSI goal 140-180   Best Practice (right click and "Reselect all SmartList Selections" daily)   Diet/type: NPO DVT prophylaxis prophylactic heparin  Pressure ulcer(s): N/A GI prophylaxis: PPI Lines: N/A Foley:  Yes, and it is still needed Code Status:  full code Last date of multidisciplinary goals of care discussion [pending]  Labs   CBC: Recent Labs  Lab 09/30/23 0220 10/01/23 0615 10/01/23 1143 10/01/23 1700 10/01/23 1709 10/01/23 2255 10/02/23 0345 10/02/23 0419  WBC 10.1 14.5*  --  14.9*  --   --  12.8*  --   NEUTROABS 8.5*  --   --   --   --   --   --   --   HGB 14.9 13.8   < > 12.5* 12.6* 12.6* 12.3* 12.6*  HCT 47.8 43.6   < > 39.7 37.0* 37.0* 37.5* 37.0*  MCV 92.1 91.4  --  93.2  --   --  88.7  --   PLT 186 127*  --  149*  --   --  128*  --    < > = values in this interval not displayed.    Basic Metabolic Panel: Recent Labs  Lab 09/30/23 0220 10/01/23 0615 10/01/23 1143 10/01/23 1545 10/01/23 1700 10/01/23 1709 10/01/23 2227 10/01/23 2255  10/02/23 0345 10/02/23 0419  NA 135 139 138   < > 138 138 136 137 134* 137  K 4.4 5.5* 5.1   < > 5.3* 5.2* 5.1 5.0 5.1 4.9  CL 106 106 108  --  107  --  107  --  104  --   CO2 19* 19*  --   --  17*  --  15*  --  16*  --   GLUCOSE 92 100* 109*  --  122*  --  144*  --  161*  --   BUN 43* 67* 72*  --  77*  --  80*  --  84*  --   CREATININE 2.69* 4.48* 4.90*  --  4.74*  --  4.73*  --  4.95*  --   CALCIUM 9.8 9.3  --   --  8.7*  --  9.1  --  9.1  --   MG  --   --   --   --   --   --   --   --  2.2  --    < > = values in this interval not displayed.   GFR: Estimated Creatinine Clearance: 16.1 mL/min (A) (by C-G formula based on SCr of 4.95 mg/dL (H)). Recent Labs  Lab 09/30/23 0220 09/30/23 0910 09/30/23 1005 10/01/23 0615 10/01/23 1700 10/01/23 1748 10/02/23 0345  WBC 10.1  --   --  14.5* 14.9*  --  12.8*  LATICACIDVEN  --  1.4 1.4  --   --  1.4  --     Liver Function Tests: Recent Labs  Lab 09/30/23 0220 10/01/23 1700  AST 20 28  ALT 20 18  ALKPHOS 91 48  BILITOT 1.0 0.7  PROT 6.9 6.4*  ALBUMIN 3.7 3.0*   Recent Labs  Lab 09/30/23 0220  LIPASE 29   No results for input(s): "AMMONIA" in the last 168 hours.  ABG    Component Value Date/Time   PHART 7.279 (L) 10/02/2023 0419   PCO2ART 38.6 10/02/2023 0419   PO2ART 86 10/02/2023 0419   HCO3 18.0 (L) 10/02/2023 0419   TCO2 19 (L) 10/02/2023 0419   ACIDBASEDEF 8.0 (H) 10/02/2023 0419   O2SAT 95 10/02/2023 0419     Coagulation Profile: No results for input(s): "INR", "PROTIME" in the last 168 hours.  Cardiac Enzymes: No results for input(s): "CKTOTAL", "CKMB", "CKMBINDEX", "TROPONINI" in the last 168 hours.  HbA1C: Hgb A1c MFr Bld  Date/Time Value Ref Range Status  09/30/2023 05:22 PM 5.6 4.8 - 5.6 % Final    Comment:    (NOTE) Pre diabetes:          5.7%-6.4%  Diabetes:              >6.4%  Glycemic control for   <7.0% adults with diabetes   09/07/2021 06:00 AM 5.5 4.8 - 5.6 % Final    Comment:     (NOTE) Pre diabetes:          5.7%-6.4%  Diabetes:              >6.4%  Glycemic control for   <7.0% adults with diabetes     CBG: Recent Labs  Lab 10/01/23 1629 10/01/23 1706 10/01/23 1917 10/01/23 2318 10/02/23 0340  GLUCAP 104* 117* 133* 153* 144*    Review of Systems:   Pt reports some dyspnea. Otherwise Negative per above.  Past Medical History:  He,  has a past medical history of DM (diabetes mellitus), type 2 new diagnosis (08/05/2012), ED (erectile dysfunction), Hyperlipidemia (08/05/2012), Hypertensive heart disease, Panic attack, Pulmonary HTN (HCC), S/P coronary artery stent placement, to RCA Promus DES (08/05/2012), Stroke (HCC), and Tobacco abuse.   Surgical History:   Past Surgical History:  Procedure Laterality Date   BIV ICD INSERTION CRT-D N/A 03/11/2022   Procedure: BIV ICD INSERTION CRT-D;  Surgeon: Regan Lemming, MD;  Location: [facility] INVASIVE CV LAB;  Service: Cardiovascular;  Laterality: N/A;   CARDIAC CATHETERIZATION  08/04/12   PCI to RCA with DES   CORONARY STENT INTERVENTION N/A 08/12/2017   Procedure: CORONARY STENT INTERVENTION;  Surgeon: Swaziland, Peter M, MD;  Location: [facility] INVASIVE CV LAB;  Service: Cardiovascular;  Laterality: N/A;   CORONARY/GRAFT ACUTE MI REVASCULARIZATION N/A 08/09/2017   Procedure: Coronary/Graft Acute MI Revascularization;  Surgeon: Lyn Records, MD;  Location: [facility] INVASIVE CV LAB;  Service: Cardiovascular;  Laterality: N/A;   LAPAROSCOPIC APPENDECTOMY N/A 10/01/2023   Procedure: LAPAROSCOPIC APPENDECTOMY;  Surgeon: Lysle Rubens, MD;  Location: [facility] OR;  Service: General;  Laterality: N/A;  LEFT HEART CATH AND CORONARY ANGIOGRAPHY N/A 08/09/2017   Procedure: LEFT HEART CATH AND CORONARY ANGIOGRAPHY;  Surgeon: Lyn Records, MD;  Location: [facility] INVASIVE CV LAB;  Service: Cardiovascular;  Laterality: N/A;   LEFT HEART CATHETERIZATION WITH CORONARY ANGIOGRAM N/A 08/04/2012   Procedure: LEFT HEART CATHETERIZATION WITH CORONARY  ANGIOGRAM;  Surgeon: Lennette Bihari, MD;  Location[facility] CATH LAB;  Service: Cardiovascular;  Laterality: N/A;   LEG SURGERY Left    rod placed for fracture repair   PENILE PROSTHESIS IMPLANT N/A 06/03/2014   Procedure: IMPLANT PENILE PROTHESIS INFLATABLE;  Surgeon: Kathi Ludwig, MD;  Location: [facility] ORS;  Service: Urology;  Laterality: N/A;  with penile block--0.5% marcaine plain   PERCUTANEOUS CORONARY STENT INTERVENTION (PCI-S) Right 08/04/2012   Procedure: PERCUTANEOUS CORONARY STENT INTERVENTION (PCI-S);  Surgeon: Lennette Bihari, MD;  Location: [facility] CATH LAB;  Service: Cardiovascular;  Laterality: Right;   TEMPORARY PACEMAKER N/A 03/08/2022   Procedure: TEMPORARY PACEMAKER;  Surgeon: Lyn Records, MD;  Location: [facility] INVASIVE CV LAB;  Service: Cardiovascular;  Laterality: N/A;   THUMB ARTHROSCOPY Left    thumb joint replaced     Social History:   reports that he has been smoking cigarettes. He has a 15 pack-year smoking history. He has never used smokeless tobacco. He reports that he does not currently use alcohol. He reports that he does not use drugs.   Family History:  His family history includes Heart attack in his paternal grandmother; Heart disease in his maternal grandmother; Kidney disease in his father; Stomach cancer in his maternal grandfather.   Allergies Allergies  Allergen Reactions   Hydrocodone Itching     Home Medications  Prior to Admission medications   Medication Sig Start Date End Date Taking? Authorizing Provider  acetaminophen (TYLENOL) 325 MG tablet Take 2 tablets (650 mg total) by mouth every 4 (four) hours as needed for headache or mild pain. 03/12/22  Yes Graciella Freer, PA-C  albuterol (PROVENTIL HFA;VENTOLIN HFA) 108 (90 Base) MCG/ACT inhaler Inhale 2 puffs into the lungs every 6 (six) hours as needed for wheezing or shortness of breath. 08/21/17  Yes Bhagat, Bhavinkumar, PA  amLODipine (NORVASC) 5 MG tablet TAKE ONE TABLET BY MOUTH DAILY 07/19/22   Yes Lennette Bihari, MD  apixaban (ELIQUIS) 5 MG TABS tablet Take 1 tablet (5 mg total) by mouth 2 (two) times daily. 03/15/22  Yes Graciella Freer, PA-C  Ascorbic Acid (VITAMIN C PO) Take 1 tablet by mouth daily.   Yes [provider]  aspirin EC 81 MG tablet Take 1 tablet (81 mg total) by mouth daily. Swallow whole. Patient taking differently: Take 81 mg by mouth at bedtime. Swallow whole. 03/15/22  Yes Graciella Freer, PA-C  atorvastatin (LIPITOR) 80 MG tablet Take 1 tablet (80 mg total) by mouth daily at 6 PM. 03/11/18  Yes Lennette Bihari, MD  benzonatate (TESSALON) 100 MG capsule Take 100-200 mg by mouth 3 (three) times daily as needed for cough. 09/21/23  Yes [provider]  buPROPion (WELLBUTRIN XL) 300 MG 24 hr tablet Take 300 mg by mouth daily.   Yes [provider]  carvedilol (COREG) 25 MG tablet TAKE ONE TABLET BY MOUTH TWICE DAILY 06/13/23  Yes Lennette Bihari, MD  clonazePAM (KLONOPIN) 1 MG tablet Take 1 mg by mouth See admin instructions. Take 1mg  (1 tablet) by mouth every night at bedtime and 1mg  (1 tablet) during the day as needed for anxiety. 02/10/22  Yes  [provider]  Coenzyme Q10 (COQ10 PO) Take 1 capsule by mouth daily.   Yes [provider]  COLCRYS 0.6 MG tablet Take 0.6 mg by mouth daily as needed (gout flare ups). 03/21/17  Yes [provider]  dapagliflozin propanediol (FARXIGA) 5 MG TABS tablet Take 5 mg by mouth daily. 09/09/23  Yes [provider]  furosemide (LASIX) 20 MG tablet Take 1 tablet (20 mg total) by mouth daily. 08/08/23  Yes Sabharwal, Aditya, DO  hydrALAZINE (APRESOLINE) 50 MG tablet Take 1 tablet (50 mg total) by mouth every 8 (eight) hours. 03/12/22  Yes Graciella Freer, PA-C  isosorbide mononitrate (IMDUR) 30 MG 24 hr tablet Take 1 tablet (30 mg total) by mouth daily. 03/12/22  Yes Graciella Freer, PA-C  multivitamin (RENA-VIT) TABS tablet Take 1 tablet by mouth daily.    Yes [provider]  nitroGLYCERIN (NITROSTAT) 0.4 MG SL tablet DISSOLVE 1 TABLET UNDER THE TONGUE EVERY 5 MINUTES AS NEEDED FOR CHEST PAIN. DO NOT EXCEED A TOTAL OF 3 DOSES IN 15 MINUTES. 07/11/23  Yes Lennette Bihari, MD  Omega-3 Fatty Acids (FISH OIL PO) Take 1 tablet by mouth daily.   Yes [provider]  oxyCODONE-acetaminophen (PERCOCET) 10-325 MG per tablet Take 1 tablet by mouth every 6 (six) hours as needed for pain.   Yes [provider]  predniSONE (DELTASONE) 10 MG tablet Take 10 mg by mouth daily.   Yes [provider]  sertraline (ZOLOFT) 100 MG tablet Take 100 mg by mouth daily.   Yes [provider]  Dwyane Luo 200-62.5-25 MCG/ACT AEPB Inhale 1 puff into the lungs daily. 04/02/22  Yes [provider]  empagliflozin (JARDIANCE) 10 MG TABS tablet Take 1 tablet (10 mg total) by mouth daily before breakfast. Patient not taking: Reported on 10/01/2023 08/08/23   Sabharwal, Aditya, DO  furosemide (LASIX) 40 MG tablet Take 40 mg by mouth daily. Patient not taking: Reported on 10/01/2023 09/13/23   [provider]  LAGEVRIO 200 MG CAPS capsule Take 4 capsules by mouth 2 (two) times daily. Patient not taking: Reported on 10/01/2023 09/18/23   [provider]     Critical care time: 35 mins

## 2023-10-03 ENCOUNTER — Inpatient Hospital Stay (HOSPITAL_COMMUNITY): Payer: 59

## 2023-10-03 DIAGNOSIS — K35209 Acute appendicitis with generalized peritonitis, without abscess, unspecified as to perforation: Secondary | ICD-10-CM | POA: Diagnosis not present

## 2023-10-03 DIAGNOSIS — A419 Sepsis, unspecified organism: Secondary | ICD-10-CM | POA: Diagnosis not present

## 2023-10-03 DIAGNOSIS — E8721 Acute metabolic acidosis: Secondary | ICD-10-CM

## 2023-10-03 DIAGNOSIS — N179 Acute kidney failure, unspecified: Secondary | ICD-10-CM | POA: Diagnosis not present

## 2023-10-03 LAB — CBC
HCT: 38.8 % — ABNORMAL LOW (ref 39.0–52.0)
Hemoglobin: 13 g/dL (ref 13.0–17.0)
MCH: 29.2 pg (ref 26.0–34.0)
MCHC: 33.5 g/dL (ref 30.0–36.0)
MCV: 87.2 fL (ref 80.0–100.0)
Platelets: 154 10*3/uL (ref 150–400)
RBC: 4.45 MIL/uL (ref 4.22–5.81)
RDW: 14.9 % (ref 11.5–15.5)
WBC: 15.8 10*3/uL — ABNORMAL HIGH (ref 4.0–10.5)
nRBC: 0 % (ref 0.0–0.2)

## 2023-10-03 LAB — MAGNESIUM: Magnesium: 2.4 mg/dL (ref 1.7–2.4)

## 2023-10-03 LAB — BLOOD GAS, ARTERIAL
Acid-base deficit: 7.4 mmol/L — ABNORMAL HIGH (ref 0.0–2.0)
Bicarbonate: 18.1 mmol/L — ABNORMAL LOW (ref 20.0–28.0)
O2 Saturation: 98.2 %
Patient temperature: 37
pCO2 arterial: 36 mm[Hg] (ref 32–48)
pH, Arterial: 7.31 — ABNORMAL LOW (ref 7.35–7.45)
pO2, Arterial: 105 mm[Hg] (ref 83–108)

## 2023-10-03 LAB — GLUCOSE, CAPILLARY
Glucose-Capillary: 130 mg/dL — ABNORMAL HIGH (ref 70–99)
Glucose-Capillary: 130 mg/dL — ABNORMAL HIGH (ref 70–99)
Glucose-Capillary: 162 mg/dL — ABNORMAL HIGH (ref 70–99)
Glucose-Capillary: 117 mg/dL — ABNORMAL HIGH (ref 70–99)
Glucose-Capillary: 125 mg/dL — ABNORMAL HIGH (ref 70–99)
Glucose-Capillary: 119 mg/dL — ABNORMAL HIGH (ref 70–99)
Glucose-Capillary: 119 mg/dL — ABNORMAL HIGH (ref 70–99)

## 2023-10-03 LAB — BASIC METABOLIC PANEL
Anion gap: 10 (ref 5–15)
BUN: 97 mg/dL — ABNORMAL HIGH (ref 8–23)
CO2: 18 mmol/L — ABNORMAL LOW (ref 22–32)
Calcium: 9.7 mg/dL (ref 8.9–10.3)
Chloride: 112 mmol/L — ABNORMAL HIGH (ref 98–111)
Creatinine, Ser: 4.33 mg/dL — ABNORMAL HIGH (ref 0.61–1.24)
GFR, Estimated: 13 mL/min — ABNORMAL LOW (ref >60)
Glucose, Bld: 149 mg/dL — ABNORMAL HIGH (ref 70–99)
Potassium: 4.2 mmol/L (ref 3.5–5.1)
Sodium: 140 mmol/L (ref 135–145)

## 2023-10-03 LAB — PHOSPHORUS: Phosphorus: 5.5 mg/dL — ABNORMAL HIGH (ref 2.5–4.6)

## 2023-10-03 MED ORDER — HYDRALAZINE HCL 20 MG/ML IJ SOLN
10.0000 mg | INTRAMUSCULAR | Status: DC | PRN
  Administered 2023-10-03 – 2023-10-04 (×2): 10 mg via INTRAVENOUS
  Filled 2023-10-03 (×2): qty 1

## 2023-10-03 MED ORDER — CARVEDILOL 12.5 MG PO TABS: 12.5000 mg | ORAL_TABLET | Freq: Two times a day (BID) | ORAL | Status: DC

## 2023-10-03 MED ORDER — ALBUMIN HUMAN 5 % IV SOLN
12.5000 g | Freq: Once | INTRAVENOUS | Status: AC
  Administered 2023-10-03: 12.5 g via INTRAVENOUS
  Filled 2023-10-03: qty 250

## 2023-10-03 NOTE — Progress Notes (Signed)
NAME:  Bobby [name], MRN:  [id number], DOB:  [birth year], LOS: 3 ADMISSION DATE:  09/30/2023, CONSULTATION DATE:  10/01/23 REFERRING MD: Bobby Fam, MD CHIEF COMPLAINT:  post op intubation  History of Present Illness:  Mr. Bobby [name] is a 78 year old male with numerous comorbidities (see below) who presented to the [facility] with complaints of RLQ pain, and nausea for one day. He was not vomiting.  CT in the ED showed acute appendicitis with appendicolith. Labs notable for WBC 14.5, potassium 5.5, Creatinine 4.48 (baseline ~2.5), lactic 1.4. He was started on zosyn. He was then admitted to medicine and surgery was consulted.    Surgery recommended lap appy but was concerned about his ability to withstand surgery given his multiple comorbidities. Cardiology was consulted and cleared him for surgery. He was also noted to have intermittent confusion after admission but before surgery.    1/28 he underwent laparoscopic appendectomy. Operative findings included necrotic appendix, a 52f blake drain was left in the pelvis. He was not extubated post op and was noted to be acidotic (ph 7.1) intra-op. CCM was consulted for post-op management.  Pertinent  Medical History  DM2, HTN, CVA, COPD on 4L [place] at baseline, CAD, STEMI 2013/2018 with PCI, HFrEF, CKD4, OSA not compliant with CPAP, A-V block with ICD. Current smoker.   Significant Hospital Events: Including procedures, antibiotic start and stop dates in addition to other pertinent events   1/27 admitted for acute appendicitis. Intermittent confusion. Started zosyn 1/28 lap appy, remained extubated post op  Interim History / Subjective:  No events overnight. Pt resting peacefully. Awakes and denies complaints.  Objective   Blood pressure (!) 168/78, pulse 76, temperature 99 F (37.2 C), temperature source Axillary, resp. rate 20, height 5' 9.02" (1.753 m), weight 118.8 kg, SpO2 97%.    Vent Mode: PRVC FiO2 (%):  [40 %] 40 % Set Rate:  [23 bmp] 23 bmp Vt  Set:  [560 mL] 560 mL PEEP:  [5 cmH20] 5 cmH20 Plateau Pressure:  [11 cmH20-17 cmH20] 11 cmH20   Intake/Output Summary (Last 24 hours) at 10/03/2023 0639 Last data filed at 10/03/2023 0400 Gross per 24 hour  Intake 1807.92 ml  Output 1730 ml  Net 77.92 ml   Filed Weights   10/01/23 1710 10/02/23 0353 10/03/23 0420  Weight: 118.2 kg 121 kg 118.8 kg    Examination: General: Sedated and intubated but responds to prompts HENT: ET tube present Lungs: trace rales, mechanically ventilated, diminshed on the left. Cardiovascular: RRR Abdomen: distended but soft, port sites with skin glue. JP drain with serosanguinous fluid  Extremities: no edema, warm, pulses palpable  Neuro: Awakens and follows commands, falls asleep quickly. Moving all ext. localizing GU: Some output of clear yellow urine  Resolved Hospital Problem list   Appendicitis   Assessment & Plan:   Acute hypoxic & hypercarbic respiratory failure mostly from sedation, basilar atelectasis and can't exclude aspiration event superimposed on  COPD (4 lpm baseline), Current Smoker and OSA Remains intubated but support is minimized. Eval today for extubation. -Increased rate to compensate for metabolic acidosis, Wean FIO2 as able -Precedex PAD protocol RASS goal -1 to -2, PRN fent -Chest x-ray stable small L pleural effusion -VAP bundle -Continue Arformoterol, budesonide, solu-cortef, revefenacin in place of Trelegy ellipta, Albuterol PRN   Acute Appendicitis with peritonitis s/p lap appy 1/28 Concern for post-op Ileus Resolved Septic Shock Off pressors. Remains sedated but awakes to follow commands. Euvolemic. -Cont zosyn day 4/7 -JP drain per surg -  Follow blood cultures (no growth), CBC, CMP -Place NGT to LIWS for decompression given abdominal distension  AKI superimposed on CKD IV w/ hyperkalemia and normal anion gap metabolic acidosis.  Baseline creatinine ~2.5. Improving. Urine output is increasing. Acidosis stable,  bicarb still low. -Continue monitoring K and urine output   CAD w/ history of STEMI and PCI, HTN and HFrEF on chronic DOAC for LV thrombus prophylaxis EF 35-40 10/2022. Currently appears euvolemic.  -Hold home lasix and eliquis for now, also lipitor, carvedilol, imdur, asa, farxiga -Volume status exams   Diabetes Type 2 not on insulin SSI goal 140-180   Best Practice (right click and "Reselect all SmartList Selections" daily)   Diet/type: NPO DVT prophylaxis prophylactic heparin  Pressure ulcer(s): N/A GI prophylaxis: PPI Lines: N/A Foley:  Yes, and it is still needed Code Status:  full code Last date of multidisciplinary goals of care discussion [pending]   Labs   CBC: Recent Labs  Lab 09/30/23 0220 10/01/23 0615 10/01/23 1143 10/01/23 1700 10/01/23 1709 10/01/23 2255 10/02/23 0345 10/02/23 0419 10/03/23 0419  WBC 10.1 14.5*  --  14.9*  --   --  12.8*  --  15.8*  NEUTROABS 8.5*  --   --   --   --   --   --   --   --   HGB 14.9 13.8   < > 12.5* 12.6* 12.6* 12.3* 12.6* 13.0  HCT 47.8 43.6   < > 39.7 37.0* 37.0* 37.5* 37.0* 38.8*  MCV 92.1 91.4  --  93.2  --   --  88.7  --  87.2  PLT 186 127*  --  149*  --   --  128*  --  154   < > = values in this interval not displayed.    Basic Metabolic Panel: Recent Labs  Lab 10/01/23 1700 10/01/23 1709 10/01/23 2227 10/01/23 2255 10/02/23 0345 10/02/23 0419 10/02/23 1558 10/03/23 0419  NA 138   < > 136 137 134* 137 139 140  K 5.3*   < > 5.1 5.0 5.1 4.9 4.1 4.2  CL 107  --  107  --  104  --  107 112*  CO2 17*  --  15*  --  16*  --  17* 18*  GLUCOSE 122*  --  144*  --  161*  --  157* 149*  BUN 77*  --  80*  --  84*  --  96* 97*  CREATININE 4.74*  --  4.73*  --  4.95*  --  4.65* 4.33*  CALCIUM 8.7*  --  9.1  --  9.1  --  9.8 9.7  MG  --   --   --   --  2.2  --   --  2.4  PHOS  --   --   --   --   --   --   --  5.5*   < > = values in this interval not displayed.   GFR: Estimated Creatinine Clearance: 18.2 mL/min (A)  (by C-G formula based on SCr of 4.33 mg/dL (H)). Recent Labs  Lab 09/30/23 0910 09/30/23 1005 10/01/23 0615 10/01/23 1700 10/01/23 1748 10/02/23 0345 10/03/23 0419  WBC  --   --  14.5* 14.9*  --  12.8* 15.8*  LATICACIDVEN 1.4 1.4  --   --  1.4  --   --     Liver Function Tests: Recent Labs  Lab 09/30/23 0220 10/01/23 1700  AST 20 28  ALT 20 18  ALKPHOS 91 48  BILITOT 1.0 0.7  PROT 6.9 6.4*  ALBUMIN 3.7 3.0*   Recent Labs  Lab 09/30/23 0220  LIPASE 29   No results for input(s): "AMMONIA" in the last 168 hours.  ABG    Component Value Date/Time   PHART 7.31 (L) 10/03/2023 0419   PCO2ART 36 10/03/2023 0419   PO2ART 105 10/03/2023 0419   HCO3 18.1 (L) 10/03/2023 0419   TCO2 19 (L) 10/02/2023 0419   ACIDBASEDEF 7.4 (H) 10/03/2023 0419   O2SAT 98.2 10/03/2023 0419     Coagulation Profile: No results for input(s): "INR", "PROTIME" in the last 168 hours.  Cardiac Enzymes: No results for input(s): "CKTOTAL", "CKMB", "CKMBINDEX", "TROPONINI" in the last 168 hours.  HbA1C: Hgb A1c MFr Bld  Date/Time Value Ref Range Status  09/30/2023 05:22 PM 5.6 4.8 - 5.6 % Final    Comment:    (NOTE) Pre diabetes:          5.7%-6.4%  Diabetes:              >6.4%  Glycemic control for   <7.0% adults with diabetes   09/07/2021 06:00 AM 5.5 4.8 - 5.6 % Final    Comment:    (NOTE) Pre diabetes:          5.7%-6.4%  Diabetes:              >6.4%  Glycemic control for   <7.0% adults with diabetes     CBG: Recent Labs  Lab 10/02/23 1236 10/02/23 1548 10/02/23 1932 10/02/23 2334 10/03/23 0325  GLUCAP 130* 120* 147* 137* 130*    Review of Systems:   Negative per above  Past Medical History:  He,  has a past medical history of DM (diabetes mellitus), type 2 new diagnosis (08/05/2012), ED (erectile dysfunction), Hyperlipidemia (08/05/2012), Hypertensive heart disease, Panic attack, Pulmonary HTN (HCC), S/P coronary artery stent placement, to RCA Promus DES  (08/05/2012), Stroke (HCC), and Tobacco abuse.   Surgical History:   Past Surgical History:  Procedure Laterality Date   BIV ICD INSERTION CRT-D N/A 03/11/2022   Procedure: BIV ICD INSERTION CRT-D;  Surgeon: Regan Lemming, MD;  Location: [facility] INVASIVE CV LAB;  Service: Cardiovascular;  Laterality: N/A;   CARDIAC CATHETERIZATION  08/04/12   PCI to RCA with DES   CORONARY STENT INTERVENTION N/A 08/12/2017   Procedure: CORONARY STENT INTERVENTION;  Surgeon: Swaz[place], Peter M, MD;  Location: [facility] INVASIVE CV LAB;  Service: Cardiovascular;  Laterality: N/A;   CORONARY/GRAFT ACUTE MI REVASCULARIZATION N/A 08/09/2017   Procedure: Coronary/Graft Acute MI Revascularization;  Surgeon: Lyn Records, MD;  Location: [facility] INVASIVE CV LAB;  Service: Cardiovascular;  Laterality: N/A;   LAPAROSCOPIC APPENDECTOMY N/A 10/01/2023   Procedure: LAPAROSCOPIC APPENDECTOMY;  Surgeon: Lysle Rubens, MD;  Location: [facility] OR;  Service: General;  Laterality: N/A;   LEFT HEART CATH AND CORONARY ANGIOGRAPHY N/A 08/09/2017   Procedure: LEFT HEART CATH AND CORONARY ANGIOGRAPHY;  Surgeon: Lyn Records, MD;  Location: [facility] INVASIVE CV LAB;  Service: Cardiovascular;  Laterality: N/A;   LEFT HEART CATHETERIZATION WITH CORONARY ANGIOGRAM N/A 08/04/2012   Procedure: LEFT HEART CATHETERIZATION WITH CORONARY ANGIOGRAM;  Surgeon: Lennette Bihari, MD;  Location: [facility] CATH LAB;  Service: Cardiovascular;  Laterality: N/A;   LEG SURGERY Left    rod placed for fracture repair   PENILE PROSTHESIS IMPLANT N/A 06/03/2014   Procedure: IMPLANT PENILE PROTHESIS INFLATABLE;  Surgeon: Kathi Ludwig, MD;  Location: [facility] ORS;  Service: Urology;  Laterality: N/A;  with penile block--0.5% marcaine plain   PERCUTANEOUS CORONARY STENT INTERVENTION (PCI-S) Right 08/04/2012   Procedure: PERCUTANEOUS CORONARY STENT INTERVENTION (PCI-S);  Surgeon: Lennette Bihari, MD;  Location: [facility] CATH LAB;  Service: Cardiovascular;  Laterality: Right;   TEMPORARY PACEMAKER  N/A 03/08/2022   Procedure: TEMPORARY PACEMAKER;  Surgeon: Lyn Records, MD;  Location: [facility] INVASIVE CV LAB;  Service: Cardiovascular;  Laterality: N/A;   THUMB ARTHROSCOPY Left    thumb joint replaced     Social History:   reports that he has been smoking cigarettes. He has a 15 pack-year smoking history. He has never used smokeless tobacco. He reports that he does not currently use alcohol. He reports that he does not use drugs.   Family History:  His family history includes Heart attack in his paternal grandmother; Heart disease in his maternal grandmother; Kidney disease in his father; Stomach cancer in his maternal grandfather.   Allergies Allergies  Allergen Reactions   Hydrocodone Itching     Home Medications  Prior to Admission medications   Medication Sig Start Date End Date Taking? Authorizing Provider  acetaminophen (TYLENOL) 325 MG tablet Take 2 tablets (650 mg total) by mouth every 4 (four) hours as needed for headache or mild pain. 03/12/22  Yes Graciella Freer, PA-C  albuterol (PROVENTIL HFA;VENTOLIN HFA) 108 (90 Base) MCG/ACT inhaler Inhale 2 puffs into the lungs every 6 (six) hours as needed for wheezing or shortness of breath. 08/21/17  Yes Bhagat, Bhavinkumar, PA  amLODipine (NORVASC) 5 MG tablet TAKE ONE TABLET BY MOUTH DAILY 07/19/22  Yes Lennette Bihari, MD  apixaban (ELIQUIS) 5 MG TABS tablet Take 1 tablet (5 mg total) by mouth 2 (two) times daily. 03/15/22  Yes Graciella Freer, PA-C  Ascorbic Acid (VITAMIN C PO) Take 1 tablet by mouth daily.   Yes [provider]  aspirin EC 81 MG tablet Take 1 tablet (81 mg total) by mouth daily. Swallow whole. Patient taking differently: Take 81 mg by mouth at bedtime. Swallow whole. 03/15/22  Yes Graciella Freer, PA-C  atorvastatin (LIPITOR) 80 MG tablet Take 1 tablet (80 mg total) by mouth daily at 6 PM. 03/11/18  Yes Lennette Bihari, MD  benzonatate (TESSALON) 100 MG capsule Take 100-200 mg by mouth  3 (three) times daily as needed for cough. 09/21/23  Yes [provider]  buPROPion (WELLBUTRIN XL) 300 MG 24 hr tablet Take 300 mg by mouth daily.   Yes [provider]  carvedilol (COREG) 25 MG tablet TAKE ONE TABLET BY MOUTH TWICE DAILY 06/13/23  Yes Lennette Bihari, MD  clonazePAM (KLONOPIN) 1 MG tablet Take 1 mg by mouth See admin instructions. Take 1mg  (1 tablet) by mouth every night at bedtime and 1mg  (1 tablet) during the day as needed for anxiety. 02/10/22  Yes [provider]  Coenzyme Q10 (COQ10 PO) Take 1 capsule by mouth daily.   Yes [provider]  COLCRYS 0.6 MG tablet Take 0.6 mg by mouth daily as needed (gout flare ups). 03/21/17  Yes [provider]  dapagliflozin propanediol (FARXIGA) 5 MG TABS tablet Take 5 mg by mouth daily. 09/09/23  Yes [provider]  furosemide (LASIX) 20 MG tablet Take 1 tablet (20 mg total) by mouth daily. 08/08/23  Yes Sabharwal, Aditya, DO  hydrALAZINE (APRESOLINE) 50 MG tablet Take 1 tablet (50 mg total) by mouth every 8 (eight) hours. 03/12/22  Yes Graciella Freer, PA-C  isosorbide mononitrate (IMDUR) 30 MG 24 hr tablet Take 1 tablet (30 mg total) by mouth daily. 03/12/22  Yes Graciella Freer, PA-C  multivitamin (RENA-VIT) TABS tablet Take 1 tablet by mouth daily.   Yes [provider]  nitroGLYCERIN (NITROSTAT) 0.4 MG SL tablet DISSOLVE 1 TABLET UNDER THE TONGUE EVERY 5 MINUTES AS NEEDED FOR CHEST PAIN. DO NOT EXCEED A TOTAL OF 3 DOSES IN 15 MINUTES. 07/11/23  Yes Lennette Bihari, MD  Omega-3 Fatty Acids (FISH OIL PO) Take 1 tablet by mouth daily.   Yes [provider]  oxyCODONE-acetaminophen (PERCOCET) 10-325 MG per tablet Take 1 tablet by mouth every 6 (six) hours as needed for pain.   Yes [provider]  predniSONE (DELTASONE) 10 MG tablet Take 10 mg by mouth daily.   Yes [provider]  sertraline (ZOLOFT) 100 MG tablet Take 100 mg by mouth daily.    Yes [provider]  Dwyane Luo 200-62.5-25 MCG/ACT AEPB Inhale 1 puff into the lungs daily. 04/02/22  Yes [provider]  empagliflozin (JARDIANCE) 10 MG TABS tablet Take 1 tablet (10 mg total) by mouth daily before breakfast. Patient not taking: Reported on 10/01/2023 08/08/23   Sabharwal, Aditya, DO  furosemide (LASIX) 40 MG tablet Take 40 mg by mouth daily. Patient not taking: Reported on 10/01/2023 09/13/23   [provider]  LAGEVRIO 200 MG CAPS capsule Take 4 capsules by mouth 2 (two) times daily. Patient not taking: Reported on 10/01/2023 09/18/23   [provider]     Critical care time: 35 mins

## 2023-10-03 NOTE — Progress Notes (Signed)
  Daily Progress Note  Assessment:    Bobby [name] is an 78 y.o. male with HTN, CVA, CAD, STEMI 2013, 2018 s/p PCI, HLD, HFrEF, CKD4, OSA, COPD, high degree AV block s/p CRT-D who presented with acute appendicitis w appendicolith  Procedures/Events: [09/30/23] Admission, cardiology consult--moderate risk [10/01/23] Laparoscopic appendectomy, post-op ICU admission, not extubated  Consults: PCCM  Subjective/Interval:    Patient more distended this AM after trickle feeds initiated.  Objective:  Vital signs for last 24 hours: Temp:  [98.1 F (36.7 C)-99.5 F (37.5 C)] 98.1 F (36.7 C) (01/30 0730) Pulse Rate:  [69-83] 74 (01/30 0745) Resp:  [11-24] 23 (01/30 0745) BP: (147-168)/(66-86) 168/78 (01/30 0404) SpO2:  [95 %-100 %] 97 % (01/30 0745) Arterial Line BP: (143-170)/(63-82) 145/63 (01/30 0745) FiO2 (%):  [40 %] 40 % (01/30 0335) Weight:  [161.0 kg] 118.8 kg (01/30 0420)  Labs: Notable for worsening creatinine, acid base deficit, acidosis improved from yesterday but still quite acidotic. Improving leukocytosis  . I have personally reviewed all labs for past 24h.  Imaging: No new imaging. I have personally reviewed all new imaging  I/Os (past 24h): Drain: 420 , Net + 132 cc, 1350cc urine output  Physical Exam:  Gen: Critically ill elderly male, intubated at sedation HEENT: ETT in place Resp: Full vent support Cardiovascular: Paced Abdomen: Worsening distension, tender to palpation Ext: no peripheral edema Neuro: Sedated   Plan:   - Continue IV antibiotics for minimum 7 days given degree of necrosis and contamination in OR. - Appreciate CCM management - Continue JP drain to bulb suction, high risk for post operative abscess - Stop TF given worsening distension - Place NGT to LIWS for decompression   LOS: 3 days   I reviewed hospitalist notes, last 24 h vitals and pain scores, last 48 h intake and output, last 24 h labs and trends, and last 24 h imaging  results.  This required moderate level of medical decision making  Donata Duff, MD [facility] Surgery 10/03/2023  *Care during the described time interval was provided by me and/or other providers on the critical care team.  I have reviewed this patient's available data, including medical history, events of note, physical examination and test results as part of my evaluation.

## 2023-10-03 NOTE — Progress Notes (Signed)
Brief Nutrition Support Note  TF initiated yesterday afternoon and held at 22mL/h to assess for tolerance. Surgery team noted increase in distention overnight. TF held this AM and NGT to be placed to suction. Discussed with RN, no significant output from NGT yet, only a small amount seen in tubing. Surgery noted that pt at "high risk for post-operative ileus given degree of inflammation diffusely seen intra-op."  Once pt begins to show return of bowl function, recommend restarting trickle feeds if pt remains intubated. May need to consider TPN if pt with ileus and has slow return of normal bowel function (more than 5-7 days).    INTERVENTION:  Initiate tube feeding via cortrak tube: Vital 1.5 at 55 ml/h (1320 ml per day) Hold at trickle rate of 15 to monitor for tolerance. Prosource TF20 60 ml BID Provides 2140 kcal, 129 gm protein, 1008 ml free water daily Consider TPN if pt remains NPO for 5-7 days as he is at high nutrition risk due to his ICU status and comorbidities   Greig Castilla, RD, LDN Registered Dietitian II Please reach out via secure chat Weekend on-call pager # available in [facility]

## 2023-10-04 DIAGNOSIS — N183 Chronic kidney disease, stage 3 unspecified: Secondary | ICD-10-CM | POA: Diagnosis not present

## 2023-10-04 DIAGNOSIS — K35209 Acute appendicitis with generalized peritonitis, without abscess, unspecified as to perforation: Secondary | ICD-10-CM | POA: Diagnosis not present

## 2023-10-04 DIAGNOSIS — A419 Sepsis, unspecified organism: Secondary | ICD-10-CM | POA: Diagnosis not present

## 2023-10-04 LAB — GLUCOSE, CAPILLARY
Glucose-Capillary: 130 mg/dL — ABNORMAL HIGH (ref 70–99)
Glucose-Capillary: 145 mg/dL — ABNORMAL HIGH (ref 70–99)
Glucose-Capillary: 128 mg/dL — ABNORMAL HIGH (ref 70–99)
Glucose-Capillary: 124 mg/dL — ABNORMAL HIGH (ref 70–99)
Glucose-Capillary: 122 mg/dL — ABNORMAL HIGH (ref 70–99)
Glucose-Capillary: 108 mg/dL — ABNORMAL HIGH (ref 70–99)

## 2023-10-04 LAB — CBC
HCT: 40.3 % (ref 39.0–52.0)
Hemoglobin: 13.4 g/dL (ref 13.0–17.0)
MCH: 29 pg (ref 26.0–34.0)
MCHC: 33.3 g/dL (ref 30.0–36.0)
MCV: 87.2 fL (ref 80.0–100.0)
Platelets: 167 10*3/uL (ref 150–400)
RBC: 4.62 MIL/uL (ref 4.22–5.81)
RDW: 15.1 % (ref 11.5–15.5)
WBC: 13 10*3/uL — ABNORMAL HIGH (ref 4.0–10.5)
nRBC: 0 % (ref 0.0–0.2)

## 2023-10-04 LAB — BASIC METABOLIC PANEL
Anion gap: 13 (ref 5–15)
BUN: 112 mg/dL — ABNORMAL HIGH (ref 8–23)
CO2: 17 mmol/L — ABNORMAL LOW (ref 22–32)
Calcium: 9.5 mg/dL (ref 8.9–10.3)
Chloride: 114 mmol/L — ABNORMAL HIGH (ref 98–111)
Creatinine, Ser: 4.34 mg/dL — ABNORMAL HIGH (ref 0.61–1.24)
GFR, Estimated: 13 mL/min — ABNORMAL LOW (ref >60)
Glucose, Bld: 145 mg/dL — ABNORMAL HIGH (ref 70–99)
Potassium: 4.4 mmol/L (ref 3.5–5.1)
Sodium: 144 mmol/L (ref 135–145)

## 2023-10-04 MED ORDER — LACTATED RINGERS IV SOLN
INTRAVENOUS | Status: AC
Start: 2023-10-04 — End: 2023-10-04

## 2023-10-04 MED ORDER — LACTATED RINGERS IV SOLN: INTRAVENOUS | Status: DC

## 2023-10-04 MED ORDER — OXYCODONE HCL 5 MG PO TABS
5.0000 mg | ORAL_TABLET | ORAL | Status: AC | PRN
  Administered 2023-10-04 – 2023-10-05 (×3): 5 mg via ORAL
  Filled 2023-10-04 (×3): qty 1

## 2023-10-04 MED ORDER — MELATONIN 3 MG PO TABS
3.0000 mg | ORAL_TABLET | Freq: Once | ORAL | Status: AC
  Administered 2023-10-04: 3 mg via ORAL
  Filled 2023-10-04: qty 1

## 2023-10-04 MED ORDER — ACETAMINOPHEN 325 MG PO TABS
650.0000 mg | ORAL_TABLET | ORAL | Status: DC | PRN
  Administered 2023-10-05 – 2023-10-10 (×4): 650 mg via ORAL
  Filled 2023-10-04 (×4): qty 2

## 2023-10-04 MED ORDER — ORAL CARE MOUTH RINSE: 15.0000 mL | OROMUCOSAL | Status: DC | PRN

## 2023-10-04 MED ORDER — FENTANYL 2500MCG IN NS 250ML (10MCG/ML) PREMIX INFUSION
0.0000 ug/h | INTRAVENOUS | Status: DC
  Administered 2023-10-04: 25 ug/h via INTRAVENOUS
  Filled 2023-10-04: qty 250

## 2023-10-04 NOTE — Care Management Important Message (Signed)
Important Message  Patient Details  Name: Bobby [name] MRN: [id number] Date of Birth: 02-Feb-1946   Important Message Given:  Other (see comment)   Spoke with brother in Law who advised [place] to send the copy to the IM to his wife address [place] 10/04/2023, 3:15 PMo home

## 2023-10-04 NOTE — Evaluation (Signed)
Physical Therapy Evaluation Patient Details Name: Bobby [name] MRN: [id number] DOB: 03-13-1946 Today's Date: 10/04/2023  History of Present Illness  Pt is a 78 y/o male admitted 1/28 with 1 day of intense abdominal pain found on arrival to be due to acute appendicitis, s/plaparoscopic appendectomy.  PMH HTN, CVA, CAD, STEMI 2013 and 2018 s/p PCI, HLD, HFrEF, CKD stage 4, OSA, COPD, high-degree AV block s/p CRT-D  Clinical Impression  Pt admitted with/for acute appendicitis s/p lap appy.  Pt is weak and deconditioned from just shy of a weak of inactivity, needing min assist overall.  Pt currently limited functionally due to the problems listed below.  (see problems list.)  Pt will benefit from PT to maximize function and safety to be able to get home safely with available assist.  I feel he could be managed by therapy in the home.         If plan is discharge home, recommend the following: A little help with walking and/or transfers;A little help with bathing/dressing/bathroom;Assistance with cooking/housework;Assist for transportation   Can travel by private vehicle        Equipment Recommendations Other (comment) (likely none, maybe RW)  Recommendations for Other Services       Functional Status Assessment Patient has had a recent decline in their functional status and demonstrates the ability to make significant improvements in function in a reasonable and predictable amount of time.     Precautions / Restrictions Precautions Precautions: Fall      Mobility  Bed Mobility Overal bed mobility: Needs Assistance Bed Mobility: Rolling, Sidelying to Sit, Sit to Sidelying Rolling: Min assist Sidelying to sit: Min assist     Sit to sidelying: Mod assist General bed mobility comments: up via R elbow with truncal assist up/down and LE's into bed with min to mod.    Transfers Overall transfer level: Needs assistance Equipment used: 1 person hand held assist Transfers: Sit  to/from Stand Sit to Stand: Min assist           General transfer comment: assist forward with little need for boost..  stood at EOB for ~4 min    Ambulation/Gait               General Gait Details: side-stepped to [facility] with min assist.  Stairs            Wheelchair Mobility     Tilt Bed    Modified Rankin (Stroke Patients Only)       Balance Overall balance assessment: Needs assistance Sitting-balance support: Single extremity supported, No upper extremity supported, Feet supported Sitting balance-Leahy Scale: Fair     Standing balance support: Single extremity supported, No upper extremity supported, During functional activity Standing balance-Leahy Scale: Fair                               Pertinent Vitals/Pain Pain Assessment Pain Assessment: Faces Faces Pain Scale: Hurts little more Pain Location: surgical excision groin Pain Descriptors / Indicators: Discomfort Pain Intervention(s): Monitored during session, Premedicated before session    Home Living Family/patient expects to be discharged to:: Private residence Living Arrangements: Alone Available Help at Discharge: Available PRN/intermittently;Friend(s);Family Type of Home: Apartment Home Access: Stairs to enter Entrance Stairs-Rails: None Entrance Stairs-Number of Steps: 1   Home Layout: One level Home Equipment: Shower seat;Grab bars - tub/shower      Prior Function Prior Level of Function : Independent/Modified Independent  Extremity/Trunk Assessment   Upper Extremity Assessment Upper Extremity Assessment: Overall WFL for tasks assessed    Lower Extremity Assessment Lower Extremity Assessment: Overall WFL for tasks assessed (general weakness for being in bed almost a week)    Cervical / Trunk Assessment Cervical / Trunk Assessment: Kyphotic  Communication   Communication Communication: No apparent difficulties Cueing Techniques:  Verbal cues  Cognition Arousal: Alert Behavior During Therapy: WFL for tasks assessed/performed Overall Cognitive Status: Within Functional Limits for tasks assessed                                          General Comments      Exercises     Assessment/Plan    PT Assessment Patient needs continued PT services  PT Problem List Decreased strength;Decreased activity tolerance;Decreased balance;Decreased mobility;Decreased knowledge of use of DME;Obesity;Pain       PT Treatment Interventions DME instruction;Gait training;Functional mobility training;Therapeutic activities;Stair training;Balance training;Patient/family education    PT Goals (Current goals can be found in the Care Plan section)  Acute Rehab PT Goals Patient Stated Goal: back home PT Goal Formulation: With patient Time For Goal Achievement: 10/18/23 Potential to Achieve Goals: Good    Frequency Min 1X/week     Co-evaluation               AM-PAC PT "6 Clicks" Mobility  Outcome Measure Help needed turning from your back to your side while in a flat bed without using bedrails?: A Little Help needed moving from lying on your back to sitting on the side of a flat bed without using bedrails?: A Little Help needed moving to and from a bed to a chair (including a wheelchair)?: A Little Help needed standing up from a chair using your arms (e.g., wheelchair or bedside chair)?: A Little Help needed to walk in hospital room?: A Little Help needed climbing 3-5 steps with a railing? : A Lot 6 Click Score: 17    End of Session   Activity Tolerance: Patient tolerated treatment well Patient left: in bed;with call bell/phone within reach Nurse Communication: Mobility status PT Visit Diagnosis: Other abnormalities of gait and mobility (R26.89)    Time: 8295-6213 PT Time Calculation (min) (ACUTE ONLY): 34 min   Charges:   PT Evaluation $PT Eval Moderate Complexity: 1 Mod PT  Treatments $Therapeutic Activity: 8-22 mins PT General Charges $$ ACUTE PT VISIT: 1 Visit         10/04/2023  Jacinto Halim., PT Acute Rehabilitation Services [phone]  (office)  Eliseo Gum Aadam Zhen 10/04/2023, 5:52 PM

## 2023-10-04 NOTE — Progress Notes (Signed)
NAME:  Bobby [name], MRN:  [id number], DOB:  1946/01/24, LOS: 4 ADMISSION DATE:  09/30/2023, CONSULTATION DATE:  10/01/23 REFERRING MD: Monna Fam, MD CHIEF COMPLAINT:  post op intubation  History of Present Illness:  Bobby [name] is a 78 year old male with numerous comorbidities (see below) who presented to the [facility] with complaints of RLQ pain, and nausea for one day. He was not vomiting.  CT in the ED showed acute appendicitis with appendicolith. Labs notable for WBC 14.5, potassium 5.5, Creatinine 4.48 (baseline ~2.5), lactic 1.4. He was started on zosyn. He was then admitted to medicine and surgery was consulted.    Surgery recommended lap appy but was concerned about his ability to withstand surgery given his multiple comorbidities. Cardiology was consulted and cleared him for surgery. He was also noted to have intermittent confusion after admission but before surgery.    1/28 he underwent laparoscopic appendectomy. Operative findings included necrotic appendix, a 17f blake drain was left in the pelvis. He was not extubated post op and was noted to be acidotic (ph 7.1) intra-op. CCM was consulted for post-op management.  Pertinent  Medical History  DM2, HTN, CVA, COPD on 4L [place] at baseline, CAD, STEMI 2013/2018 with PCI, HFrEF, CKD4, OSA not compliant with CPAP, A-V block with ICD. Current smoker.   Significant Hospital Events: Including procedures, antibiotic start and stop dates in addition to other pertinent events   1/27 admitted for acute appendicitis. Intermittent confusion. Started zosyn 1/28 lap appy, remained extubated post op  Interim History / Subjective:  Resting in bed this morning, remains sedated. Overnight episodes of transient hypotension to 60s/40s lasting 10-20 seconds with repeated spontaneous recovery. Associated increases in heart rate. Although moderately sedated, pt appeared unbothered by episodes. Cardiology consulted.  Objective   Blood pressure 129/68, pulse 69,  temperature 98.8 F (37.1 C), temperature source Axillary, resp. rate 20, height 5' 9.02" (1.753 m), weight 117 kg, SpO2 99%.    Vent Mode: PRVC FiO2 (%):  [40 %] 40 % Set Rate:  [20 bmp] 20 bmp Vt Set:  [560 mL] 560 mL PEEP:  [5 cmH20] 5 cmH20 Pressure Support:  [10 cmH20] 10 cmH20 Plateau Pressure:  [16 cmH20-18 cmH20] 17 cmH20   Intake/Output Summary (Last 24 hours) at 10/04/2023 0645 Last data filed at 10/04/2023 0500 Gross per 24 hour  Intake 577.33 ml  Output 1115 ml  Net -537.67 ml   Filed Weights   10/02/23 0353 10/03/23 0420 10/04/23 0500  Weight: 121 kg 118.8 kg 117 kg    Examination: General: Sedated and intubated but responds to prompts HENT: ET tube present Lungs: trace rales, mechanically ventilated, diminshed on the left. Cardiovascular: RRR Abdomen: distended but soft, port sites with skin glue. JP drain with serosanguinous fluid  Extremities: no edema, warm, pulses palpable  Neuro: Awakens and follows commands, falls asleep quickly. Moving all ext. localizing GU: Some output of clear yellow urine  Resolved Hospital Problem list   Appendicitis   Assessment & Plan:  Acute hypoxic & hypercarbic respiratory failure mostly from sedation, basilar atelectasis and can't exclude aspiration event superimposed on  COPD (4 lpm baseline), Current Smoker and OSA Remains intubated but support is minimized. Eval today for extubation. -Increased rate to compensate for metabolic acidosis, Wean FIO2 as able -wean sedation -VAP bundle -Continue Arformoterol, budesonide, solu-cortef, revefenacin in place of Trelegy ellipta, Albuterol PRN   Transient hypotension with tachycardia Repeated episodes of tachycardia and BP drops to 60s/40s overnight for several seconds  with subsequent return to normotension. Cause is unclear, possible hypovolemia vs sedation driven vs device error. It appears the hypotension occurs before tachycardia. EP evaluated and his implanted device is in  working order. Will give fluids, will reduce sedation, and monitor.  Acute Appendicitis with peritonitis s/p lap appy 1/28 Concern for post-op Ileus Resolved Septic Shock Off pressors. Remains sedated but awakes to follow commands. Euvolemic. -Cont zosyn day 5/7 -JP drain per surg -Follow blood cultures (no growth 4 days), CBC, CMP -clamp NG tube, trickle feeds   AKI superimposed on CKD IV w/ hyperkalemia and normal anion gap metabolic acidosis.  Baseline creatinine ~2.5. Cr stable, BUN increasing. Urine output is increasing. Acidosis stable, bicarb still low. -Continue monitoring K and urine output -maintenance fluids today   CAD w/ history of STEMI and PCI, HTN and HFrEF on chronic DOAC for LV thrombus prophylaxis EF 35-40 10/2022. Currently appears euvolemic.  -Hold home lasix and eliquis for now, also lipitor, carvedilol, imdur, asa, farxiga -Volume status exams   Diabetes Type 2 not on insulin SSI goal 140-180   Best Practice (right click and "Reselect all SmartList Selections" daily)   Diet/type: NPO DVT prophylaxis prophylactic heparin  Pressure ulcer(s): N/A GI prophylaxis: PPI Lines: N/A Foley:  Yes, and it is still needed Code Status:  full code Last date of multidisciplinary goals of care discussion [pending]  Labs   CBC: Recent Labs  Lab 09/30/23 0220 10/01/23 0615 10/01/23 1143 10/01/23 1700 10/01/23 1709 10/01/23 2255 10/02/23 0345 10/02/23 0419 10/03/23 0419  WBC 10.1 14.5*  --  14.9*  --   --  12.8*  --  15.8*  NEUTROABS 8.5*  --   --   --   --   --   --   --   --   HGB 14.9 13.8   < > 12.5* 12.6* 12.6* 12.3* 12.6* 13.0  HCT 47.8 43.6   < > 39.7 37.0* 37.0* 37.5* 37.0* 38.8*  MCV 92.1 91.4  --  93.2  --   --  88.7  --  87.2  PLT 186 127*  --  149*  --   --  128*  --  154   < > = values in this interval not displayed.    Basic Metabolic Panel: Recent Labs  Lab 10/01/23 1700 10/01/23 1709 10/01/23 2227 10/01/23 2255 10/02/23 0345  10/02/23 0419 10/02/23 1558 10/03/23 0419  NA 138   < > 136 137 134* 137 139 140  K 5.3*   < > 5.1 5.0 5.1 4.9 4.1 4.2  CL 107  --  107  --  104  --  107 112*  CO2 17*  --  15*  --  16*  --  17* 18*  GLUCOSE 122*  --  144*  --  161*  --  157* 149*  BUN 77*  --  80*  --  84*  --  96* 97*  CREATININE 4.74*  --  4.73*  --  4.95*  --  4.65* 4.33*  CALCIUM 8.7*  --  9.1  --  9.1  --  9.8 9.7  MG  --   --   --   --  2.2  --   --  2.4  PHOS  --   --   --   --   --   --   --  5.5*   < > = values in this interval not displayed.   GFR: Estimated Creatinine Clearance: 18 mL/min (A) (  by C-G formula based on SCr of 4.33 mg/dL (H)). Recent Labs  Lab 09/30/23 0910 09/30/23 1005 10/01/23 0615 10/01/23 1700 10/01/23 1748 10/02/23 0345 10/03/23 0419  WBC  --   --  14.5* 14.9*  --  12.8* 15.8*  LATICACIDVEN 1.4 1.4  --   --  1.4  --   --     Liver Function Tests: Recent Labs  Lab 09/30/23 0220 10/01/23 1700  AST 20 28  ALT 20 18  ALKPHOS 91 48  BILITOT 1.0 0.7  PROT 6.9 6.4*  ALBUMIN 3.7 3.0*   Recent Labs  Lab 09/30/23 0220  LIPASE 29   No results for input(s): "AMMONIA" in the last 168 hours.  ABG    Component Value Date/Time   PHART 7.31 (L) 10/03/2023 0419   PCO2ART 36 10/03/2023 0419   PO2ART 105 10/03/2023 0419   HCO3 18.1 (L) 10/03/2023 0419   TCO2 19 (L) 10/02/2023 0419   ACIDBASEDEF 7.4 (H) 10/03/2023 0419   O2SAT 98.2 10/03/2023 0419     Coagulation Profile: No results for input(s): "INR", "PROTIME" in the last 168 hours.  Cardiac Enzymes: No results for input(s): "CKTOTAL", "CKMB", "CKMBINDEX", "TROPONINI" in the last 168 hours.  HbA1C: Hgb A1c MFr Bld  Date/Time Value Ref Range Status  09/30/2023 05:22 PM 5.6 4.8 - 5.6 % Final    Comment:    (NOTE) Pre diabetes:          5.7%-6.4%  Diabetes:              >6.4%  Glycemic control for   <7.0% adults with diabetes   09/07/2021 06:00 AM 5.5 4.8 - 5.6 % Final    Comment:    (NOTE) Pre diabetes:           5.7%-6.4%  Diabetes:              >6.4%  Glycemic control for   <7.0% adults with diabetes     CBG: Recent Labs  Lab 10/03/23 1105 10/03/23 1550 10/03/23 1921 10/03/23 2305 10/04/23 0308  GLUCAP 117* 125* 119* 119* 130*    Review of Systems:   Negative per above  Past Medical History:  He,  has a past medical history of DM (diabetes mellitus), type 2 new diagnosis (08/05/2012), ED (erectile dysfunction), Hyperlipidemia (08/05/2012), Hypertensive heart disease, Panic attack, Pulmonary HTN (HCC), S/P coronary artery stent placement, to RCA Promus DES (08/05/2012), Stroke (HCC), and Tobacco abuse.   Surgical History:   Past Surgical History:  Procedure Laterality Date   BIV ICD INSERTION CRT-D N/A 03/11/2022   Procedure: BIV ICD INSERTION CRT-D;  Surgeon: Regan Lemming, MD;  Location: [facility] INVASIVE CV LAB;  Service: Cardiovascular;  Laterality: N/A;   CARDIAC CATHETERIZATION  08/04/12   PCI to RCA with DES   CORONARY STENT INTERVENTION N/A 08/12/2017   Procedure: CORONARY STENT INTERVENTION;  Surgeon: Swaziland, Peter M, MD;  Location: [facility] INVASIVE CV LAB;  Service: Cardiovascular;  Laterality: N/A;   CORONARY/GRAFT ACUTE MI REVASCULARIZATION N/A 08/09/2017   Procedure: Coronary/Graft Acute MI Revascularization;  Surgeon: Lyn Records, MD;  Location: [facility] INVASIVE CV LAB;  Service: Cardiovascular;  Laterality: N/A;   LAPAROSCOPIC APPENDECTOMY N/A 10/01/2023   Procedure: LAPAROSCOPIC APPENDECTOMY;  Surgeon: Lysle Rubens, MD;  Location: [facility] OR;  Service: General;  Laterality: N/A;   LEFT HEART CATH AND CORONARY ANGIOGRAPHY N/A 08/09/2017   Procedure: LEFT HEART CATH AND CORONARY ANGIOGRAPHY;  Surgeon: Lyn Records, MD;  Location: [facility] INVASIVE CV LAB;  Service: Cardiovascular;  Laterality: N/A;   LEFT HEART CATHETERIZATION WITH CORONARY ANGIOGRAM N/A 08/04/2012   Procedure: LEFT HEART CATHETERIZATION WITH CORONARY ANGIOGRAM;  Surgeon: Lennette Bihari, MD;  Location: [facility] CATH  LAB;  Service: Cardiovascular;  Laterality: N/A;   LEG SURGERY Left    rod placed for fracture repair   PENILE PROSTHESIS IMPLANT N/A 06/03/2014   Procedure: IMPLANT PENILE PROTHESIS INFLATABLE;  Surgeon: Kathi Ludwig, MD;  Location: [facility] ORS;  Service: Urology;  Laterality: N/A;  with penile block--0.5% marcaine plain   PERCUTANEOUS CORONARY STENT INTERVENTION (PCI-S) Right 08/04/2012   Procedure: PERCUTANEOUS CORONARY STENT INTERVENTION (PCI-S);  Surgeon: Lennette Bihari, MD;  Location[facility] CATH LAB;  Service: Cardiovascular;  Laterality: Right;   TEMPORARY PACEMAKER N/A 03/08/2022   Procedure: TEMPORARY PACEMAKER;  Surgeon: Lyn Records, MD;  Location: [facility] INVASIVE CV LAB;  Service: Cardiovascular;  Laterality: N/A;   THUMB ARTHROSCOPY Left    thumb joint replaced     Social History:   reports that he has been smoking cigarettes. He has a 15 pack-year smoking history. He has never used smokeless tobacco. He reports that he does not currently use alcohol. He reports that he does not use drugs.   Family History:  His family history includes Heart attack in his paternal grandmother; Heart disease in his maternal grandmother; Kidney disease in his father; Stomach cancer in his maternal grandfather.   Allergies Allergies  Allergen Reactions   Hydrocodone Itching     Home Medications  Prior to Admission medications   Medication Sig Start Date End Date Taking? Authorizing Provider  acetaminophen (TYLENOL) 325 MG tablet Take 2 tablets (650 mg total) by mouth every 4 (four) hours as needed for headache or mild pain. 03/12/22  Yes Graciella Freer, PA-C  albuterol (PROVENTIL HFA;VENTOLIN HFA) 108 (90 Base) MCG/ACT inhaler Inhale 2 puffs into the lungs every 6 (six) hours as needed for wheezing or shortness of breath. 08/21/17  Yes Bhagat, Bhavinkumar, PA  amLODipine (NORVASC) 5 MG tablet TAKE ONE TABLET BY MOUTH DAILY 07/19/22  Yes Lennette Bihari, MD  apixaban (ELIQUIS) 5 MG TABS  tablet Take 1 tablet (5 mg total) by mouth 2 (two) times daily. 03/15/22  Yes Graciella Freer, PA-C  Ascorbic Acid (VITAMIN C PO) Take 1 tablet by mouth daily.   Yes [provider]  aspirin EC 81 MG tablet Take 1 tablet (81 mg total) by mouth daily. Swallow whole. Patient taking differently: Take 81 mg by mouth at bedtime. Swallow whole. 03/15/22  Yes Graciella Freer, PA-C  atorvastatin (LIPITOR) 80 MG tablet Take 1 tablet (80 mg total) by mouth daily at 6 PM. 03/11/18  Yes Lennette Bihari, MD  benzonatate (TESSALON) 100 MG capsule Take 100-200 mg by mouth 3 (three) times daily as needed for cough. 09/21/23  Yes [provider]  buPROPion (WELLBUTRIN XL) 300 MG 24 hr tablet Take 300 mg by mouth daily.   Yes [provider]  carvedilol (COREG) 25 MG tablet TAKE ONE TABLET BY MOUTH TWICE DAILY 06/13/23  Yes Lennette Bihari, MD  clonazePAM (KLONOPIN) 1 MG tablet Take 1 mg by mouth See admin instructions. Take 1mg  (1 tablet) by mouth every night at bedtime and 1mg  (1 tablet) during the day as needed for anxiety. 02/10/22  Yes [provider]  Coenzyme Q10 (COQ10 PO) Take 1 capsule by mouth daily.   Yes [provider]  COLCRYS 0.6 MG tablet Take 0.6 mg by mouth  daily as needed (gout flare ups). 03/21/17  Yes [provider]  dapagliflozin propanediol (FARXIGA) 5 MG TABS tablet Take 5 mg by mouth daily. 09/09/23  Yes [provider]  furosemide (LASIX) 20 MG tablet Take 1 tablet (20 mg total) by mouth daily. 08/08/23  Yes Sabharwal, Aditya, DO  hydrALAZINE (APRESOLINE) 50 MG tablet Take 1 tablet (50 mg total) by mouth every 8 (eight) hours. 03/12/22  Yes Graciella Freer, PA-C  isosorbide mononitrate (IMDUR) 30 MG 24 hr tablet Take 1 tablet (30 mg total) by mouth daily. 03/12/22  Yes Graciella Freer, PA-C  multivitamin (RENA-VIT) TABS tablet Take 1 tablet by mouth daily.   Yes [provider]  nitroGLYCERIN  (NITROSTAT) 0.4 MG SL tablet DISSOLVE 1 TABLET UNDER THE TONGUE EVERY 5 MINUTES AS NEEDED FOR CHEST PAIN. DO NOT EXCEED A TOTAL OF 3 DOSES IN 15 MINUTES. 07/11/23  Yes Lennette Bihari, MD  Omega-3 Fatty Acids (FISH OIL PO) Take 1 tablet by mouth daily.   Yes [provider]  oxyCODONE-acetaminophen (PERCOCET) 10-325 MG per tablet Take 1 tablet by mouth every 6 (six) hours as needed for pain.   Yes [provider]  predniSONE (DELTASONE) 10 MG tablet Take 10 mg by mouth daily.   Yes [provider]  sertraline (ZOLOFT) 100 MG tablet Take 100 mg by mouth daily.   Yes [provider]  Dwyane Luo 200-62.5-25 MCG/ACT AEPB Inhale 1 puff into the lungs daily. 04/02/22  Yes [provider]  empagliflozin (JARDIANCE) 10 MG TABS tablet Take 1 tablet (10 mg total) by mouth daily before breakfast. Patient not taking: Reported on 10/01/2023 08/08/23   Sabharwal, Aditya, DO  furosemide (LASIX) 40 MG tablet Take 40 mg by mouth daily. Patient not taking: Reported on 10/01/2023 09/13/23   [provider]  LAGEVRIO 200 MG CAPS capsule Take 4 capsules by mouth 2 (two) times daily. Patient not taking: Reported on 10/01/2023 09/18/23   [provider]     Critical care time: 35 mins

## 2023-10-04 NOTE — Progress Notes (Signed)
  Daily Progress Note  Assessment:    Bobby [name] is an 78 y.o. male with HTN, CVA, CAD, STEMI 2013, 2018 s/p PCI, HLD, HFrEF, CKD4, OSA, COPD, high degree AV block s/p CRT-D who presented with acute appendicitis w appendicolith  Procedures/Events: [09/30/23] Admission, cardiology consult--moderate risk [10/01/23] Laparoscopic appendectomy, post-op ICU admission, not extubated  Consults: PCCM  Subjective/Interval:    Patient with wide complex tachycardia overnight with drop in pressures, multiple episodes but will quickly recover.  Objective:  Vital signs for last 24 hours: Temp:  [97.5 F (36.4 C)-100 F (37.8 C)] 97.5 F (36.4 C) (01/31 0736) Pulse Rate:  [60-98] 66 (01/31 0741) Resp:  [14-29] 22 (01/31 0741) BP: (82-152)/(56-128) 146/66 (01/31 0741) SpO2:  [93 %-99 %] 98 % (01/31 0741) Arterial Line BP: (64-177)/(42-69) 97/49 (01/31 0730) FiO2 (%):  [40 %] 40 % (01/31 0741) Weight:  [161 kg] 117 kg (01/31 0500)  Labs: Notable for worsening creatinine, acid base deficit, acidosis improved from yesterday but still quite acidotic. Improving leukocytosis  . I have personally reviewed all labs for past 24h.  Imaging: No new imaging. I have personally reviewed all new imaging  I/Os (past 24h): Drain: 105, Net Negative 1.07L, 1550cc urine output, 60 cc NGT output recorded, more in cannister  Physical Exam:  Gen: Critically ill elderly male, intubated at sedation HEENT: ETT in place, NGT with bilious drainage, a few hundred cc in canister  Resp: Full vent support Cardiovascular: Paced Abdomen: Worsening distension, tender to palpation Ext: no peripheral edema Neuro: Sedated   Plan:   - Continue IV antibiotics for minimum 7 days given degree of necrosis and contamination in OR. - Appreciate CCM management - Continue JP drain to bulb suction, high risk for post operative abscess - Can clamp NGT and attempt trickle TF again today. If worsening distension reoccurs then  place NGT back to suction, stop TF and would order TPN. If tolerates trickle today we can advance tomorrow and remove NGT.   LOS: 4 days   I reviewed hospitalist notes, last 24 h vitals and pain scores, last 48 h intake and output, last 24 h labs and trends, and last 24 h imaging results. Discussed with CCM attending, Dr. Judeth Horn.  This required moderate level of medical decision making  Donata Duff, MD [facility] 10/04/2023  *Care during the described time interval was provided by me and/or other providers on the critical care team.  I have reviewed this patient's available data, including medical history, events of note, physical examination and test results as part of my evaluation.

## 2023-10-04 NOTE — Progress Notes (Signed)
eLink Physician-Brief Progress Note Patient Name: Bobby [name] DOB: Jan 27, 1946 MRN: [id number]   Date of Service  10/04/2023  HPI/Events of Note  Patient has a PPM and is having intermittent bursts of wide complex tachycardia during which he drops his blood pressure but recovers spontaneously, this has happened several times, EKG shows a wide-complex tachycardia, concern is for a pacemaker mediated tachycardia.  eICU Interventions  I attempted to consult EP cardiology unsuccessfully. I have therefore consulted [facility] to evaluate and possibly get EP cardiology involved.        Thomasene Lot Terisha Losasso 10/04/2023, 5:04 AM

## 2023-10-04 NOTE — Procedures (Signed)
Extubation Procedure Note  Patient Details:   Name: CHINONSO LINKER DOB: [birth year] MRN: [id number]   Airway Documentation:    Vent end date: 10/04/23 Vent end time: 1115   Evaluation  O2 sats: stable throughout Complications: No apparent complications Patient did tolerate procedure well. Bilateral Breath Sounds: Clear, Diminished   Yes  Pt extubated to 4L [place], pt tolerated well. Cuff leak present, no stridor noted, RN at bedside, RT will monitor as needed.   Thornell Mule 10/04/2023, 12:02 PM

## 2023-10-04 NOTE — Progress Notes (Addendum)
Nutrition Brief Note  Patient discussed with RN and MD. Plans for extubation today.   60ml output documented x24 hours via NGT, therefore clamped and transitioned to trickle tube feeding. RN reports tolerance so far to trickle feeds. Discussions had regarding replacement of Cortrak tube. Given plans to extubate, spoke with MD about holding on placement of Cortrak in the event pt is able to pass swallow screen and resume a diet.   INTERVENTION:  If diet unable to advance and tube feeding remains appropriate, once able to advance, recommend: Vital 1.5 at 55 ml/h (1320 ml per day) Advance by 10ml q8h to goal rate Prosource TF20 60 ml BID Provides 2140 kcal, 129 gm protein, 1008 ml free water daily Consider TPN if pt remains NPO for 5-7 days as he is at high nutrition risk due to his ICU status and comorbidities  Drusilla Kanner, RDN, LDN Clinical Nutrition

## 2023-10-05 DIAGNOSIS — A419 Sepsis, unspecified organism: Secondary | ICD-10-CM | POA: Diagnosis not present

## 2023-10-05 DIAGNOSIS — J189 Pneumonia, unspecified organism: Secondary | ICD-10-CM | POA: Diagnosis not present

## 2023-10-05 DIAGNOSIS — N183 Chronic kidney disease, stage 3 unspecified: Secondary | ICD-10-CM | POA: Diagnosis not present

## 2023-10-05 DIAGNOSIS — K35209 Acute appendicitis with generalized peritonitis, without abscess, unspecified as to perforation: Secondary | ICD-10-CM | POA: Diagnosis not present

## 2023-10-05 LAB — CULTURE, BLOOD (ROUTINE X 2)
Culture: NO GROWTH
Culture: NO GROWTH

## 2023-10-05 LAB — CBC
HCT: 42.3 % (ref 39.0–52.0)
Hemoglobin: 13.5 g/dL (ref 13.0–17.0)
MCH: 28.7 pg (ref 26.0–34.0)
MCHC: 31.9 g/dL (ref 30.0–36.0)
MCV: 90 fL (ref 80.0–100.0)
Platelets: 179 10*3/uL (ref 150–400)
RBC: 4.7 MIL/uL (ref 4.22–5.81)
RDW: 15.2 % (ref 11.5–15.5)
WBC: 14.3 10*3/uL — ABNORMAL HIGH (ref 4.0–10.5)
nRBC: 0 % (ref 0.0–0.2)

## 2023-10-05 LAB — GLUCOSE, CAPILLARY
Glucose-Capillary: 125 mg/dL — ABNORMAL HIGH (ref 70–99)
Glucose-Capillary: 136 mg/dL — ABNORMAL HIGH (ref 70–99)
Glucose-Capillary: 166 mg/dL — ABNORMAL HIGH (ref 70–99)
Glucose-Capillary: 123 mg/dL — ABNORMAL HIGH (ref 70–99)
Glucose-Capillary: 109 mg/dL — ABNORMAL HIGH (ref 70–99)
Glucose-Capillary: 108 mg/dL — ABNORMAL HIGH (ref 70–99)

## 2023-10-05 MED ORDER — ASPIRIN 81 MG PO TBEC
81.0000 mg | DELAYED_RELEASE_TABLET | Freq: Every day | ORAL | Status: DC
  Administered 2023-10-05 – 2023-10-11 (×7): 81 mg via ORAL
  Filled 2023-10-05 (×7): qty 1

## 2023-10-05 MED ORDER — HYDRALAZINE HCL 25 MG PO TABS
25.0000 mg | ORAL_TABLET | Freq: Three times a day (TID) | ORAL | Status: DC
Start: 2023-10-05 — End: 2023-10-11
  Administered 2023-10-05 – 2023-10-11 (×16): 25 mg via ORAL
  Filled 2023-10-05 (×18): qty 1

## 2023-10-05 MED ORDER — RENA-VITE PO TABS
1.0000 | ORAL_TABLET | Freq: Every day | ORAL | Status: DC
  Administered 2023-10-05 – 2023-10-11 (×7): 1 via ORAL
  Filled 2023-10-05 (×7): qty 1

## 2023-10-05 MED ORDER — AMLODIPINE BESYLATE 5 MG PO TABS
5.0000 mg | ORAL_TABLET | Freq: Every day | ORAL | Status: DC
  Administered 2023-10-05 – 2023-10-11 (×7): 5 mg via ORAL
  Filled 2023-10-05 (×7): qty 1

## 2023-10-05 MED ORDER — SERTRALINE HCL 100 MG PO TABS
100.0000 mg | ORAL_TABLET | Freq: Every day | ORAL | Status: DC
  Administered 2023-10-05 – 2023-10-11 (×7): 100 mg via ORAL
  Filled 2023-10-05: qty 1
  Filled 2023-10-05: qty 2
  Filled 2023-10-05 (×5): qty 1

## 2023-10-05 MED ORDER — OMEGA-3-ACID ETHYL ESTERS 1 G PO CAPS
1.0000 g | ORAL_CAPSULE | Freq: Every day | ORAL | Status: DC
  Administered 2023-10-05 – 2023-10-11 (×7): 1 g via ORAL
  Filled 2023-10-05 (×7): qty 1

## 2023-10-05 MED ORDER — ATORVASTATIN CALCIUM 80 MG PO TABS
80.0000 mg | ORAL_TABLET | Freq: Every day | ORAL | Status: DC
Start: 2023-10-05 — End: 2023-10-11
  Administered 2023-10-05 – 2023-10-10 (×6): 80 mg via ORAL
  Filled 2023-10-05: qty 2
  Filled 2023-10-05 (×5): qty 1

## 2023-10-05 MED ORDER — ISOSORBIDE MONONITRATE ER 30 MG PO TB24
30.0000 mg | ORAL_TABLET | Freq: Every day | ORAL | Status: DC
  Administered 2023-10-05 – 2023-10-11 (×7): 30 mg via ORAL
  Filled 2023-10-05 (×7): qty 1

## 2023-10-05 MED ORDER — CARVEDILOL 6.25 MG PO TABS
6.2500 mg | ORAL_TABLET | Freq: Two times a day (BID) | ORAL | Status: DC
  Administered 2023-10-05 – 2023-10-11 (×12): 6.25 mg via ORAL
  Filled 2023-10-05 (×8): qty 1
  Filled 2023-10-05: qty 2
  Filled 2023-10-05 (×3): qty 1

## 2023-10-05 MED ORDER — BUPROPION HCL ER (XL) 150 MG PO TB24
300.0000 mg | ORAL_TABLET | Freq: Every day | ORAL | Status: DC
  Administered 2023-10-05 – 2023-10-11 (×7): 300 mg via ORAL
  Filled 2023-10-05: qty 2
  Filled 2023-10-05 (×3): qty 1
  Filled 2023-10-05: qty 2
  Filled 2023-10-05 (×2): qty 1

## 2023-10-05 NOTE — Progress Notes (Signed)
NAME:  Bobby [name], MRN:  [id number], DOB:  04-29-1946, LOS: 5 ADMISSION DATE:  09/30/2023, CONSULTATION DATE:  10/01/23 REFERRING MD: Monna Fam, MD CHIEF COMPLAINT:  post op intubation  History of Present Illness:  [name] is a 78 year old male with numerous comorbidities (see below) who presented to the [facility] with complaints of RLQ pain, and nausea for one day. He was not vomiting.  CT in the ED showed acute appendicitis with appendicolith. Labs notable for WBC 14.5, potassium 5.5, Creatinine 4.48 (baseline ~2.5), lactic 1.4. He was started on zosyn. He was then admitted to medicine and surgery was consulted.    Surgery recommended lap appy but was concerned about his ability to withstand surgery given his multiple comorbidities. Cardiology was consulted and cleared him for surgery. He was also noted to have intermittent confusion after admission but before surgery.    1/28 he underwent laparoscopic appendectomy. Operative findings included necrotic appendix, a 48f blake drain was left in the pelvis. He was not extubated post op and was noted to be acidotic (ph 7.1) intra-op. CCM was consulted for post-op management.  Pertinent  Medical History  DM2, HTN, CVA, COPD on 4L [place] at baseline, CAD, STEMI 2013/2018 with PCI, HFrEF, CKD4, OSA not compliant with CPAP, A-V block with ICD. Current smoker.   Significant Hospital Events: Including procedures, antibiotic start and stop dates in addition to other pertinent events   1/27 admitted for acute appendicitis. Intermittent confusion. Started zosyn 1/28 lap appy, remained extubated post op 1/31 Overnight episodes of transient hypotension to 60s/40s lasting 10-20 seconds with repeated spontaneous recovery. 2/1 seen sitting up in bed in no acute distress, NG-tube removed, tolerated clear liquid diet  Interim History / Subjective:  Seen sitting up in bed, very excited for clear liquid diet he had this morning  Objective   Blood pressure 138/61,  pulse 81, temperature 97.8 F (36.6 C), temperature source Oral, resp. rate 19, height 5' 9.02" (1.753 m), weight 118.8 kg, SpO2 97%.        Intake/Output Summary (Last 24 hours) at 10/05/2023 1108 Last data filed at 10/05/2023 0546 Gross per 24 hour  Intake 1324.48 ml  Output 1420 ml  Net -95.52 ml   Filed Weights   10/03/23 0420 10/04/23 0500 10/05/23 0323  Weight: 118.8 kg 117 kg 118.8 kg    Examination: General: Acute on chronic ill-appearing elderly male lying in bed in no acute distress HEENT: /AT, MM pink/moist, PERRL,  Neuro: Alert and oriented x 3, nonfocal CV: s1s2 regular rate and rhythm, no murmur, rubs, or gallops,  PULM: Clear to auscultation bilaterally, no increased work of breathing, no added breath sounds GI: soft, bowel sounds active in all 4 quadrants, non-tender, non-distended, tolerating clear liquid diet Extremities: warm/dry, no edema  Skin: no rashes or lesions  Resolved Hospital Problem list   Appendicitis  Transient hypotension  Assessment & Plan:  Aspiration pneumonia COPD Current Smoker and OSA P: Continue supplemental oxygen for sat goal greater than 90 Mobilize as able Aspiration precautions Continue Brovana, Pulmicort, Yupelri Empiric Zosyn as below  CAD w/ history of STEMI and PCI Essential HTN  HFrEF on chronic DOAC for LV thrombus prophylaxis -EF 35-40 10/2022. Currently appears euvolemic.  -Hold home lasix and eliquis for now, also lipitor, carvedilol, imdur, asa, farxiga P: Discussed anticoagulation plan with surgery plan is to check CBC this a.m. to evaluate hemoglobin and platelets if this stable will start heparin drip with no bolus today and hopefully  resume home Eliquis within the next several days Continuous telemetry  Resume home aspirin, and statin Resume home carvedilol and hydralazine at reduced doses Continue home Imdur Daily assessment for need to diurese Strict intake and output Daily weight Optimize  electrolytes  Acute Appendicitis with peritonitis s/p lap appy 1/28 Concern for post-op Ileus Resolved Septic Shock P: Primary management per surgery Continue Zosyn, plan for 7 days total JP drain per surgery NG tube discontinued 2/1, clear liquid diet Mobilize as able   AKI superimposed on CKD IV w/ hyperkalemia and normal anion gap metabolic acidosis.  -Baseline creatinine ~2.5. Cr stable, BUN increasing. Urine output is increasing.  P: Follow renal function  Monitor urine output Trend Bmet Avoid nephrotoxins Ensure adequate renal perfusion    Diabetes Type 2 not on insulin P: Continue SSI CBG goal 140-180  Stable for transfer out of ICU, will ask hospitalist to assume care was transferred out of ICU  Best Practice (right click and "Reselect all SmartList Selections" daily)   Diet/type: Clear liquid diet  DVT prophylaxis prophylactic heparin  Pressure ulcer(s): N/A GI prophylaxis: PPI Lines: N/A Foley:  Yes, and it is still needed Code Status:  full code Last date of multidisciplinary goals of care discussion [pending]    Critical care time: NA  Dorie Ohms D. Harris, NP-C [facility] Personal contact information can be found on Amion  If no contact or response made please call [phone] 10/05/2023, 11:17 AM

## 2023-10-05 NOTE — Plan of Care (Signed)
   Problem: Coping: Goal: Ability to adjust to condition or change in health will improve Outcome: Progressing   Problem: Nutritional: Goal: Maintenance of adequate nutrition will improve Outcome: Progressing

## 2023-10-05 NOTE — Plan of Care (Signed)
  Problem: Coping: Goal: Ability to adjust to condition or change in health will improve Outcome: Progressing   Problem: Fluid Volume: Goal: Ability to maintain a balanced intake and output will improve Outcome: Progressing   Problem: Metabolic: Goal: Ability to maintain appropriate glucose levels will improve Outcome: Progressing   Problem: Nutritional: Goal: Maintenance of adequate nutrition will improve Outcome: Progressing Goal: Progress toward achieving an optimal weight will improve Outcome: Progressing   Problem: Skin Integrity: Goal: Risk for impaired skin integrity will decrease Outcome: Progressing   Problem: Education: Goal: Knowledge of General Education information will improve Description: Including pain rating scale, medication(s)/side effects and non-pharmacologic comfort measures Outcome: Progressing   Problem: Health Behavior/Discharge Planning: Goal: Ability to manage health-related needs will improve Outcome: Progressing   Problem: Clinical Measurements: Goal: Ability to maintain clinical measurements within normal limits will improve Outcome: Progressing Goal: Will remain free from infection Outcome: Progressing Goal: Diagnostic test results will improve Outcome: Progressing Goal: Respiratory complications will improve Outcome: Progressing Goal: Cardiovascular complication will be avoided Outcome: Progressing   Problem: Activity: Goal: Risk for activity intolerance will decrease Outcome: Progressing   Problem: Nutrition: Goal: Adequate nutrition will be maintained Outcome: Progressing   Problem: Coping: Goal: Level of anxiety will decrease Outcome: Progressing   Problem: Elimination: Goal: Will not experience complications related to bowel motility Outcome: Progressing Goal: Will not experience complications related to urinary retention Outcome: Progressing   Problem: Pain Managment: Goal: General experience of comfort will improve  and/or be controlled Outcome: Progressing   Problem: Safety: Goal: Ability to remain free from injury will improve Outcome: Progressing   Problem: Skin Integrity: Goal: Risk for impaired skin integrity will decrease Outcome: Progressing

## 2023-10-05 NOTE — Progress Notes (Addendum)
4 Days Post-Op   Subjective/Chief Complaint: Tolerated extubation Denies SOB Passing flatus Passed swallowing eval   Objective: Vital signs in last 24 hours: Temp:  [97.3 F (36.3 C)-98.9 F (37.2 C)] 97.8 F (36.6 C) (02/01 0400) Pulse Rate:  [66-89] 79 (02/01 0800) Resp:  [13-22] 20 (02/01 0800) BP: (125-163)/(57-105) 138/61 (02/01 0800) SpO2:  [94 %-99 %] 98 % (02/01 0800) Arterial Line BP: (105-189)/(53-67) 153/59 (02/01 0800) FiO2 (%):  [40 %] 40 % (01/31 1052) Weight:  [118.8 kg] 118.8 kg (02/01 0323) Last BM Date :  (PTA)  Intake/Output from previous day: 01/31 0701 - 02/01 0700 In: 1472.4 [I.V.:977.4; NG/GT:395; IV Piggyback:100] Out: 1540 [Urine:1200; Emesis/NG output:20; Drains:320] Intake/Output this shift: No intake/output data recorded.  Exam: Awake and alert Comfortable No resp distress Abdomen obese but soft Drain serosang   Lab Results:  Recent Labs    10/03/23 0419 10/04/23 0754  WBC 15.8* 13.0*  HGB 13.0 13.4  HCT 38.8* 40.3  PLT 154 167   BMET Recent Labs    10/03/23 0419 10/04/23 0754  NA 140 144  K 4.2 4.4  CL 112* 114*  CO2 18* 17*  GLUCOSE 149* 145*  BUN 97* 112*  CREATININE 4.33* 4.34*  CALCIUM 9.7 9.5   PT/INR No results for input(s): "LABPROT", "INR" in the last 72 hours. ABG Recent Labs    10/03/23 0419  PHART 7.31*  HCO3 18.1*    Studies/Results: DG Abd Portable 1V Result Date: 10/03/2023 CLINICAL DATA:  NG placement. EXAM: PORTABLE ABDOMEN - 1 VIEW COMPARISON:  Abdominal radiograph dated 10/02/2023. FINDINGS: Interval removal of the feeding tube and placement of an enteric tube with tip over the epigastric area likely in the distal stomach. IMPRESSION: Enteric tube with tip over the distal stomach. Electronically Signed   By: Elgie Collard M.D.   On: 10/03/2023 14:07    Anti-infectives: Anti-infectives (From admission, onward)    Start     Dose/Rate Route Frequency Ordered Stop   10/02/23 1400   piperacillin-tazobactam (ZOSYN) IVPB 2.25 g        2.25 g 100 mL/hr over 30 Minutes Intravenous Every 8 hours 10/02/23 0842     09/30/23 1800  piperacillin-tazobactam (ZOSYN) IVPB 3.375 g  Status:  Discontinued        3.375 g 12.5 mL/hr over 240 Minutes Intravenous Every 8 hours 09/30/23 1723 10/02/23 0842   09/30/23 0800  piperacillin-tazobactam (ZOSYN) IVPB 3.375 g        3.375 g 100 mL/hr over 30 Minutes Intravenous  Once 09/30/23 0742 09/30/23 0959       Assessment/Plan: 78 yr old with multiple med problems s/p Lap Appy for perforated appendicitis 1/28 by VB  -tolerated extubation -passed swallowing evaluation and passing flatus so will D/C NG and tube feeds.  Keep on clear liquids today -continue IV antibiotics -working with PT   Abigail Miyamoto MD [facility] 10/05/2023

## 2023-10-06 DIAGNOSIS — N183 Chronic kidney disease, stage 3 unspecified: Secondary | ICD-10-CM | POA: Diagnosis not present

## 2023-10-06 DIAGNOSIS — K353 Acute appendicitis with localized peritonitis, without perforation or gangrene: Secondary | ICD-10-CM

## 2023-10-06 DIAGNOSIS — A419 Sepsis, unspecified organism: Secondary | ICD-10-CM | POA: Diagnosis not present

## 2023-10-06 LAB — GLUCOSE, CAPILLARY
Glucose-Capillary: 106 mg/dL — ABNORMAL HIGH (ref 70–99)
Glucose-Capillary: 120 mg/dL — ABNORMAL HIGH (ref 70–99)
Glucose-Capillary: 125 mg/dL — ABNORMAL HIGH (ref 70–99)
Glucose-Capillary: 131 mg/dL — ABNORMAL HIGH (ref 70–99)
Glucose-Capillary: 109 mg/dL — ABNORMAL HIGH (ref 70–99)
Glucose-Capillary: 135 mg/dL — ABNORMAL HIGH (ref 70–99)
Glucose-Capillary: 107 mg/dL — ABNORMAL HIGH (ref 70–99)

## 2023-10-06 LAB — BASIC METABOLIC PANEL
Anion gap: 12 (ref 5–15)
BUN: 118 mg/dL — ABNORMAL HIGH (ref 8–23)
CO2: 16 mmol/L — ABNORMAL LOW (ref 22–32)
Calcium: 9.1 mg/dL (ref 8.9–10.3)
Chloride: 112 mmol/L — ABNORMAL HIGH (ref 98–111)
Creatinine, Ser: 3.88 mg/dL — ABNORMAL HIGH (ref 0.61–1.24)
GFR, Estimated: 15 mL/min — ABNORMAL LOW (ref >60)
Glucose, Bld: 108 mg/dL — ABNORMAL HIGH (ref 70–99)
Potassium: 4.3 mmol/L (ref 3.5–5.1)
Sodium: 140 mmol/L (ref 135–145)

## 2023-10-06 LAB — APTT: aPTT: 56 s — ABNORMAL HIGH (ref 24–36)

## 2023-10-06 LAB — HEPARIN LEVEL (UNFRACTIONATED): Heparin Unfractionated: 1.06 [IU]/mL — ABNORMAL HIGH (ref 0.30–0.70)

## 2023-10-06 MED ORDER — UMECLIDINIUM BROMIDE 62.5 MCG/ACT IN AEPB
1.0000 | INHALATION_SPRAY | Freq: Every day | RESPIRATORY_TRACT | Status: DC
  Administered 2023-10-08 – 2023-10-11 (×3): 1 via RESPIRATORY_TRACT
  Filled 2023-10-06: qty 7

## 2023-10-06 MED ORDER — PANTOPRAZOLE SODIUM 40 MG PO TBEC
40.0000 mg | DELAYED_RELEASE_TABLET | Freq: Every day | ORAL | Status: DC
  Administered 2023-10-06 – 2023-10-10 (×5): 40 mg via ORAL
  Filled 2023-10-06 (×6): qty 1

## 2023-10-06 MED ORDER — OXYCODONE HCL 5 MG PO TABS
5.0000 mg | ORAL_TABLET | ORAL | Status: DC | PRN
  Administered 2023-10-06 – 2023-10-11 (×11): 5 mg via ORAL
  Filled 2023-10-06 (×11): qty 1

## 2023-10-06 MED ORDER — CLONAZEPAM 0.5 MG PO TABS
0.5000 mg | ORAL_TABLET | Freq: Every evening | ORAL | Status: DC | PRN
  Administered 2023-10-06 – 2023-10-09 (×2): 0.5 mg via ORAL
  Filled 2023-10-06 (×2): qty 1

## 2023-10-06 MED ORDER — HEPARIN (PORCINE) 25000 UT/250ML-% IV SOLN
1500.0000 [IU]/h | INTRAVENOUS | Status: DC
Start: 2023-10-06 — End: 2023-10-07
  Administered 2023-10-06: 1300 [IU]/h via INTRAVENOUS
  Filled 2023-10-06 (×2): qty 250

## 2023-10-06 MED ORDER — FLUTICASONE FUROATE-VILANTEROL 200-25 MCG/ACT IN AEPB
1.0000 | INHALATION_SPRAY | Freq: Every day | RESPIRATORY_TRACT | Status: DC
  Administered 2023-10-08 – 2023-10-11 (×3): 1 via RESPIRATORY_TRACT
  Filled 2023-10-06: qty 28

## 2023-10-06 NOTE — Plan of Care (Signed)
  Problem: Coping: Goal: Ability to adjust to condition or change in health will improve Outcome: Progressing   Problem: Fluid Volume: Goal: Ability to maintain a balanced intake and output will improve Outcome: Progressing   Problem: Metabolic: Goal: Ability to maintain appropriate glucose levels will improve Outcome: Progressing   Problem: Nutritional: Goal: Maintenance of adequate nutrition will improve Outcome: Progressing Goal: Progress toward achieving an optimal weight will improve Outcome: Progressing   Problem: Skin Integrity: Goal: Risk for impaired skin integrity will decrease Outcome: Progressing   Problem: Education: Goal: Knowledge of General Education information will improve Description: Including pain rating scale, medication(s)/side effects and non-pharmacologic comfort measures Outcome: Progressing   Problem: Health Behavior/Discharge Planning: Goal: Ability to manage health-related needs will improve Outcome: Progressing   Problem: Clinical Measurements: Goal: Ability to maintain clinical measurements within normal limits will improve Outcome: Progressing Goal: Will remain free from infection Outcome: Progressing Goal: Diagnostic test results will improve Outcome: Progressing Goal: Respiratory complications will improve Outcome: Progressing Goal: Cardiovascular complication will be avoided Outcome: Progressing   Problem: Activity: Goal: Risk for activity intolerance will decrease Outcome: Progressing   Problem: Nutrition: Goal: Adequate nutrition will be maintained Outcome: Progressing   Problem: Coping: Goal: Level of anxiety will decrease Outcome: Progressing   Problem: Elimination: Goal: Will not experience complications related to bowel motility Outcome: Progressing Goal: Will not experience complications related to urinary retention Outcome: Progressing   Problem: Pain Managment: Goal: General experience of comfort will improve  and/or be controlled Outcome: Progressing   Problem: Safety: Goal: Ability to remain free from injury will improve Outcome: Progressing   Problem: Skin Integrity: Goal: Risk for impaired skin integrity will decrease Outcome: Progressing

## 2023-10-06 NOTE — Progress Notes (Signed)
PHARMACY - ANTICOAGULATION CONSULT NOTE  Pharmacy Consult:  Heparin Indication: History of ventricular thrombus  Allergies  Allergen Reactions   Hydrocodone Itching    Patient Measurements: Height: 5' 9.02" (175.3 cm) Weight: 119 kg (262 lb 5.6 oz) IBW/kg (Calculated) : 70.74 Heparin Dosing Weight: 97 kg  Vital Signs: Temp: 97.4 F (36.3 C) (02/02 2037) Temp Source: Axillary (02/02 2037) BP: 122/56 (02/02 2000) Pulse Rate: 77 (02/02 2000)  Labs: Recent Labs    10/04/23 0754 10/05/23 1222 10/06/23 0401 10/06/23 2000  HGB 13.4 13.5  --   --   HCT 40.3 42.3  --   --   PLT 167 179  --   --   APTT  --   --   --  56*  HEPARINUNFRC  --   --   --  1.06*  CREATININE 4.34*  --  3.88*  --     Estimated Creatinine Clearance: 20.3 mL/min (A) (by C-G formula based on SCr of 3.88 mg/dL (H)).   Medical History: Past Medical History:  Diagnosis Date   DM (diabetes mellitus), type 2 new diagnosis 08/05/2012   diet control   ED (erectile dysfunction)    surgery planned   Hyperlipidemia 08/05/2012   Hypertensive heart disease    Panic attack    Pulmonary HTN (HCC)    echo 08/05/12, EF 55-60%, PA pressure 45mm   S/P coronary artery stent placement, to RCA Promus DES 08/05/2012   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   Tobacco abuse      Assessment: 13 YOM with history of ventricular thrombus on Eliquis PTA, last dose possibly 1/26 AM.  Pharmacy consulted to dose IV heparin.  CBC stable; no bleeding reported.  Worsening renal function.  PM aPTT: 56 seconds, not correlating with the heparin level. CBC stable, no issues with infusion or overt s/sx of bleeding reported.   Goal of Therapy:  Heparin level 0.3-0.7 units/ml Monitor platelets by anticoagulation protocol: Yes   Plan:  Increase IV heparin to 1500 units/hr - no bolus per MD Check 8 hr heparin level and aPTT Daily heparin level and CBC Order daily aPTT if needed  Ruben Im, PharmD Clinical  Pharmacist 10/06/2023 9:43 PM Please check AMION for all [facility] Pharmacy numbers

## 2023-10-06 NOTE — Progress Notes (Signed)
PHARMACY - ANTICOAGULATION CONSULT NOTE  Pharmacy Consult:  Heparin Indication: History of ventricular thrombus  Allergies  Allergen Reactions   Hydrocodone Itching    Patient Measurements: Height: 5' 9.02" (175.3 cm) Weight: 119 kg (262 lb 5.6 oz) IBW/kg (Calculated) : 70.74 Heparin Dosing Weight: 97 kg  Vital Signs: Temp: 98.2 F (36.8 C) (02/02 0820) Temp Source: Oral (02/02 0820) BP: 124/65 (02/02 1111) Pulse Rate: 80 (02/02 1106)  Labs: Recent Labs    10/04/23 0754 10/05/23 1222 10/06/23 0401  HGB 13.4 13.5  --   HCT 40.3 42.3  --   PLT 167 179  --   CREATININE 4.34*  --  3.88*    Estimated Creatinine Clearance: 20.3 mL/min (A) (by C-G formula based on SCr of 3.88 mg/dL (H)).   Medical History: Past Medical History:  Diagnosis Date   DM (diabetes mellitus), type 2 new diagnosis 08/05/2012   diet control   ED (erectile dysfunction)    surgery planned   Hyperlipidemia 08/05/2012   Hypertensive heart disease    Panic attack    Pulmonary HTN (HCC)    echo 08/05/12, EF 55-60%, PA pressure 45mm   S/P coronary artery stent placement, to RCA Promus DES 08/05/2012   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   Tobacco abuse       Assessment: 20 YOM with history of ventricular thrombus on Eliquis PTA, last dose possibly 1/26 AM.  Pharmacy consulted to dose IV heparin.  CBC stable; no bleeding reported.  Worsening renal function.  Goal of Therapy:  Heparin level 0.3-0.7 units/ml Monitor platelets by anticoagulation protocol: Yes   Plan:  Start IV heparin at 1300 units/hr - no bolus per MD Check 8 hr heparin level and aPTT Daily heparin level and CBC Order daily aPTT if needed  Dierdra Salameh D. Laney Potash, PharmD, BCPS, BCCCP 10/06/2023, 11:21 AM

## 2023-10-06 NOTE — Progress Notes (Signed)
5 Days Post-Op   Subjective/Chief Complaint: No complaints this morning Tolerating full liquids Denies nausea/bloating Had BM's    Objective: Vital signs in last 24 hours: Temp:  [96.5 F (35.8 C)-98.2 F (36.8 C)] 98.2 F (36.8 C) (02/02 0820) Pulse Rate:  [74-83] 79 (02/02 0600) Resp:  [15-25] 20 (02/02 0600) BP: (90-138)/(64-91) 116/64 (02/02 0408) SpO2:  [93 %-99 %] 95 % (02/02 0805) Arterial Line BP: (141-180)/(55-66) 164/59 (02/01 1715) Weight:  [161 kg] 119 kg (02/02 0439) Last BM Date :  (PTA)  Intake/Output from previous day: 02/01 0701 - 02/02 0700 In: 2827.5 [P.O.:2640; NG/GT:37.5; IV Piggyback:150] Out: 1975 [Urine:1800; Drains:175] Intake/Output this shift: No intake/output data recorded.  Exam: Awake and alert Abdomen obese, soft, minimally tender Drain serosang  Lab Results:  Recent Labs    10/04/23 0754 10/05/23 1222  WBC 13.0* 14.3*  HGB 13.4 13.5  HCT 40.3 42.3  PLT 167 179   BMET Recent Labs    10/04/23 0754 10/06/23 0401  NA 144 140  K 4.4 4.3  CL 114* 112*  CO2 17* 16*  GLUCOSE 145* 108*  BUN 112* 118*  CREATININE 4.34* 3.88*  CALCIUM 9.5 9.1   PT/INR No results for input(s): "LABPROT", "INR" in the last 72 hours. ABG No results for input(s): "PHART", "HCO3" in the last 72 hours.  Invalid input(s): "PCO2", "PO2"  Studies/Results: No results found.  Anti-infectives: Anti-infectives (From admission, onward)    Start     Dose/Rate Route Frequency Ordered Stop   10/02/23 1400  piperacillin-tazobactam (ZOSYN) IVPB 2.25 g        2.25 g 100 mL/hr over 30 Minutes Intravenous Every 8 hours 10/02/23 0842     09/30/23 1800  piperacillin-tazobactam (ZOSYN) IVPB 3.375 g  Status:  Discontinued        3.375 g 12.5 mL/hr over 240 Minutes Intravenous Every 8 hours 09/30/23 1723 10/02/23 0842   09/30/23 0800  piperacillin-tazobactam (ZOSYN) IVPB 3.375 g        3.375 g 100 mL/hr over 30 Minutes Intravenous  Once 09/30/23 0960  09/30/23 0959       Assessment/Plan: 78 yr old with multiple med problems s/p Lap Appy for perforated appendicitis 1/28 by VB   -improving -will advance to soft diet -drain still with 175 cc out last 24 hours so will stay in place for now  Abigail Miyamoto MD 10/06/2023

## 2023-10-06 NOTE — Progress Notes (Signed)
Pt dangled on edge of bed, however very weak and moderately out of breath to do so. Sat on the edge of bed for about 5-8 minutes and then go back into bed as patient was exhausted and increasingly more weak.

## 2023-10-06 NOTE — Progress Notes (Incomplete)
NAME:  Bobby [name], MRN:  [id number], DOB:  09/05/1945, LOS: 6 ADMISSION DATE:  09/30/2023, CONSULTATION DATE:  10/01/23 REFERRING MD: Bobby Fam, MD CHIEF COMPLAINT:  post op intubation  History of Present Illness:  Bobby [name] is a 78 year old male with numerous comorbidities (see below) who presented to the [facility] with complaints of RLQ pain, and nausea for one day. He was not vomiting.  CT in the ED showed acute appendicitis with appendicolith. Labs notable for WBC 14.5, potassium 5.5, Creatinine 4.48 (baseline ~2.5), lactic 1.4. He was started on zosyn. He was then admitted to medicine and surgery was consulted.    Surgery recommended lap appy but was concerned about his ability to withstand surgery given his multiple comorbidities. Cardiology was consulted and cleared him for surgery. He was also noted to have intermittent confusion after admission but before surgery.    1/28 he underwent laparoscopic appendectomy. Operative findings included necrotic appendix, a 61f blake drain was left in the pelvis. He was not extubated post op and was noted to be acidotic (ph 7.1) intra-op. CCM was consulted for post-op management.  Pertinent  Medical History  DM2, HTN, CVA, COPD on 4L [place] at baseline, CAD, STEMI 2013/2018 with PCI, HFrEF, CKD4, OSA not compliant with CPAP, A-V block with ICD. Current smoker.   Significant Hospital Events: Including procedures, antibiotic start and stop dates in addition to other pertinent events   1/27 admitted for acute appendicitis. Intermittent confusion. Started zosyn 1/28 lap appy, remained extubated post op 1/31 Overnight episodes of transient hypotension to 60s/40s lasting 10-20 seconds with repeated spontaneous recovery. 2/1 seen sitting up in bed in no acute distress, NG-tube removed, tolerated clear liquid diet  Interim History / Subjective:  Seen sitting up in bed, very excited for clear liquid diet he had this morning  Objective   Blood pressure 116/64,  pulse 79, temperature 98.2 F (36.8 C), temperature source Oral, resp. rate 20, height 5' 9.02" (1.753 m), weight 119 kg, SpO2 95%.        Intake/Output Summary (Last 24 hours) at 10/06/2023 1022 Last data filed at 10/06/2023 0424 Gross per 24 hour  Intake 1830 ml  Output 1975 ml  Net -145 ml   Filed Weights   10/04/23 0500 10/05/23 0323 10/06/23 0439  Weight: 117 kg 118.8 kg 119 kg    Examination: General: Acute on chronic ill-appearing elderly male lying in bed in no acute distress HEENT: /AT, MM pink/moist, PERRL,  Neuro: Alert and oriented x 3, nonfocal CV: s1s2 regular rate and rhythm, no murmur, rubs, or gallops,  PULM: Clear to auscultation bilaterally, no increased work of breathing, no added breath sounds GI: soft, bowel sounds active in all 4 quadrants, non-tender, non-distended, tolerating clear liquid diet Extremities: warm/dry, no edema  Skin: no rashes or lesions  Resolved Hospital Problem list   Appendicitis  Transient hypotension  Assessment & Plan:  Aspiration pneumonia COPD Current Smoker and OSA P: Continue supplemental oxygen for sat goal greater than 90 Mobilize as able Aspiration precautions Continue Brovana, Pulmicort, Yupelri Empiric Zosyn as below  CAD w/ history of STEMI and PCI Essential HTN  HFrEF on chronic DOAC for LV thrombus prophylaxis -EF 35-40 10/2022. Currently appears euvolemic.  -Hold home lasix and eliquis for now, also lipitor, carvedilol, imdur, asa, farxiga P: Discussed anticoagulation plan with surgery plan is to check CBC this a.m. to evaluate hemoglobin and platelets if this stable will start heparin drip with no bolus today and hopefully  resume home Eliquis within the next several days Continuous telemetry  Resume home aspirin, and statin Resume home carvedilol and hydralazine at reduced doses Continue home Imdur Daily assessment for need to diurese Strict intake and output Daily weight Optimize electrolytes  Acute  Appendicitis with peritonitis s/p lap appy 1/28 Concern for post-op Ileus Resolved Septic Shock P: Primary management per surgery Continue Zosyn, plan for 7 days total JP drain per surgery NG tube discontinued 2/1, clear liquid diet Mobilize as able   AKI superimposed on CKD IV w/ hyperkalemia and normal anion gap metabolic acidosis.  -Baseline creatinine ~2.5. Cr stable, BUN increasing. Urine output is increasing.  P: Follow renal function  Monitor urine output Trend Bmet Avoid nephrotoxins Ensure adequate renal perfusion    Diabetes Type 2 not on insulin P: Continue SSI CBG goal 140-180  Stable for transfer out of ICU, will ask hospitalist to assume care was transferred out of ICU  Best Practice (right click and "Reselect all SmartList Selections" daily)   Diet/type: Clear liquid diet  DVT prophylaxis prophylactic heparin  Pressure ulcer(s): N/A GI prophylaxis: PPI Lines: N/A Foley:  Yes, and it is still needed Code Status:  full code Last date of multidisciplinary goals of care discussion [pending]    Critical care time: NA  Whitney D. Harris, NP-C [facility] Personal contact information can be found on Amion  If no contact or response made please call [phone] 10/06/2023, 10:22 AM

## 2023-10-06 NOTE — Progress Notes (Signed)
NAME:  Bobby [name], MRN:  [id number], DOB:  01-12-1946, LOS: 6 ADMISSION DATE:  09/30/2023, CONSULTATION DATE:  10/01/23 REFERRING MD: Monna Fam, MD CHIEF COMPLAINT:  post op intubation  History of Present Illness:  Bobby [name] is a 78 year old male with numerous comorbidities (see below) who presented to the [facility] with complaints of RLQ pain, and nausea for one day. He was not vomiting.  CT in the ED showed acute appendicitis with appendicolith. Labs notable for WBC 14.5, potassium 5.5, Creatinine 4.48 (baseline ~2.5), lactic 1.4. He was started on zosyn. He was then admitted to medicine and surgery was consulted.    Surgery recommended lap appy but was concerned about his ability to withstand surgery given his multiple comorbidities. Cardiology was consulted and cleared him for surgery. He was also noted to have intermittent confusion after admission but before surgery.    1/28 he underwent laparoscopic appendectomy. Operative findings included necrotic appendix, a 84f blake drain was left in the pelvis. He was not extubated post op and was noted to be acidotic (ph 7.1) intra-op. CCM was consulted for post-op management.  Pertinent  Medical History  DM2, HTN, CVA, COPD on 4L [place] at baseline, CAD, STEMI 2013/2018 with PCI, HFrEF, CKD4, OSA not compliant with CPAP, A-V block with ICD. Current smoker.   Significant Hospital Events: Including procedures, antibiotic start and stop dates in addition to other pertinent events   1/27 admitted for acute appendicitis. Intermittent confusion. Started zosyn 1/28 lap appy, remained extubated post op 1/31 Overnight episodes of transient hypotension to 60s/40s lasting 10-20 seconds with repeated spontaneous recovery. 2/1 seen sitting up in bed in no acute distress, NG-tube removed, tolerated clear liquid diet  Interim History / Subjective:  IN good spirits eating clear liquid diet.  Objective   Blood pressure 116/64, pulse 79, temperature 98.2 F (36.8  C), temperature source Oral, resp. rate 20, height 5' 9.02" (1.753 m), weight 119 kg, SpO2 95%.        Intake/Output Summary (Last 24 hours) at 10/06/2023 1035 Last data filed at 10/06/2023 0424 Gross per 24 hour  Intake 1830 ml  Output 1975 ml  Net -145 ml   Filed Weights   10/04/23 0500 10/05/23 0323 10/06/23 0439  Weight: 117 kg 118.8 kg 119 kg    Examination: No distress Lungs diminished Minimal TTP abd Moves to command Aox3 Trace edema Drain in place  BUN/Cr noted CBC stable  Resolved Hospital Problem list   Appendicitis  Transient hypotension  Assessment & Plan:  Aspiration pneumonia COPD Current Smoker and OSA CAD w/ history of STEMI and PCI Essential HTN  HFrEF on chronic DOAC for LV thrombus prophylaxis Acute Appendicitis with peritonitis s/p lap appy 1/28 Post-op Ileus Resolved Septic Shock AKI superimposed on CKD IV w/ hyperkalemia and normal anion gap metabolic acidosis.  Diabetes Type 2 not on insulin  - Avoid nephrotoxic drugs, flirting with need for HD - Encourage IS, up to chair - Diet advancement and drain per CCS - Nebs to MDI's - GDMT as ordered - Heparin no bolus in case needs HD access - PT/O help appreciated - Remains stable to transfer out of ICU  Best Practice (right click and "Reselect all SmartList Selections" daily)   Diet/type: per CCS DVT prophylaxis heparin gtt Pressure ulcer(s): N/A GI prophylaxis: PPI Lines: N/A Foley:  Yes, and it is still needed Code Status:  full code Last date of multidisciplinary goals of care discussion [pending]   Bobby Halsted MD  PCCM [facility] Personal contact information can be found on Amion  If no contact or response made please call [phone] 10/06/2023, 10:35 AM

## 2023-10-07 ENCOUNTER — Inpatient Hospital Stay (HOSPITAL_COMMUNITY): Payer: 59

## 2023-10-07 DIAGNOSIS — K353 Acute appendicitis with localized peritonitis, without perforation or gangrene: Secondary | ICD-10-CM | POA: Diagnosis not present

## 2023-10-07 DIAGNOSIS — A419 Sepsis, unspecified organism: Secondary | ICD-10-CM | POA: Diagnosis not present

## 2023-10-07 DIAGNOSIS — N183 Chronic kidney disease, stage 3 unspecified: Secondary | ICD-10-CM | POA: Diagnosis not present

## 2023-10-07 LAB — GLUCOSE, CAPILLARY
Glucose-Capillary: 103 mg/dL — ABNORMAL HIGH (ref 70–99)
Glucose-Capillary: 104 mg/dL — ABNORMAL HIGH (ref 70–99)
Glucose-Capillary: 107 mg/dL — ABNORMAL HIGH (ref 70–99)
Glucose-Capillary: 108 mg/dL — ABNORMAL HIGH (ref 70–99)
Glucose-Capillary: 133 mg/dL — ABNORMAL HIGH (ref 70–99)
Glucose-Capillary: 94 mg/dL (ref 70–99)

## 2023-10-07 MED ORDER — IOHEXOL 9 MG/ML PO SOLN
500.0000 mL | Freq: Once | ORAL | Status: AC
  Administered 2023-10-07: 500 mL via ORAL

## 2023-10-07 MED ORDER — ALUM & MAG HYDROXIDE-SIMETH 200-200-20 MG/5ML PO SUSP
30.0000 mL | Freq: Four times a day (QID) | ORAL | Status: DC | PRN
  Administered 2023-10-07 – 2023-10-08 (×2): 30 mL via ORAL
  Filled 2023-10-07 (×2): qty 30

## 2023-10-07 NOTE — Progress Notes (Signed)
Physical Therapy Treatment Patient Details Name: Bobby [name] MRN: [id number] DOB: 1946/06/02 Today's Date: 10/07/2023   History of Present Illness Pt is a 78 y/o male admitted 1/28 with 1 day of intense abdominal pain found on arrival to be due to acute appendicitis, s/plaparoscopic appendectomy.  PMH HTN, CVA, CAD, STEMI 2013 and 2018 s/p PCI, HLD, HFrEF, CKD stage 4, OSA, COPD, high-degree AV block s/p CRT-D    PT Comments  Pt tolerates treatment well, although he is requiring much more physical assistance than on eval. Pt requires significant assistance to stand and is unable to ambulate due to BLE weakness. PT notes pt has more difficulty advancing RLE during transfer attempts and must perform a stand pivot transfer rather than a stand step transfer when returning to bed from bedside commode. Due to decline in mobility PT recommends short term inpatient PT services at the time of discharge.   If plan is discharge home, recommend the following: Two people to help with walking and/or transfers;Two people to help with bathing/dressing/bathroom;Assistance with cooking/housework;Assist for transportation;Help with stairs or ramp for entrance   Can travel by private vehicle     No  Equipment Recommendations  Wheelchair (measurements PT);Hoyer lift;Hospital bed (if discharging home)    Recommendations for Other Services       Precautions / Restrictions Precautions Precautions: Fall Restrictions Weight Bearing Restrictions Per Provider Order: No     Mobility  Bed Mobility Overal bed mobility: Needs Assistance Bed Mobility: Rolling, Sidelying to Sit, Sit to Supine Rolling: Mod assist Sidelying to sit: Mod assist   Sit to supine: Max assist        Transfers Overall transfer level: Needs assistance Equipment used: 1 person hand held assist Transfers: Sit to/from Stand, Bed to chair/wheelchair/BSC Sit to Stand: Max assist Stand pivot transfers: Max assist, From elevated  surface         General transfer comment: pt requires heavy assistance to transfer. PT notes pt has increased difficulty mobilizing RLE in standing and has a much harder time with transfer to R side when returning to bed    Ambulation/Gait                   Stairs             Wheelchair Mobility     Tilt Bed    Modified Rankin (Stroke Patients Only)       Balance Overall balance assessment: Needs assistance Sitting-balance support: Single extremity supported, Feet supported Sitting balance-Leahy Scale: Poor     Standing balance support: Bilateral upper extremity supported, Reliant on assistive device for balance Standing balance-Leahy Scale: Poor Standing balance comment: maxA                            Cognition Arousal: Alert Behavior During Therapy: WFL for tasks assessed/performed Overall Cognitive Status: Impaired/Different from baseline Area of Impairment: Awareness                           Awareness: Emergent            Exercises      General Comments General comments (skin integrity, edema, etc.): VSS on 4L [place]. PT notes sanguineous drainage on pt's gown. New gown applied and further drainage noted. Appears to either be coming from JP drain site or LLQ wound. RN is aware      Pertinent Vitals/Pain Pain Assessment  Pain Assessment: Faces Faces Pain Scale: Hurts even more Pain Location: abdomen Pain Descriptors / Indicators: Sore Pain Intervention(s): Monitored during session    Home Living                          Prior Function            PT Goals (current goals can now be found in the care plan section) Acute Rehab PT Goals Patient Stated Goal: back home Progress towards PT goals: Not progressing toward goals - comment (pt has regressed since initial PT eval on Friday)    Frequency    Min 1X/week      PT Plan      Co-evaluation              AM-PAC PT "6 Clicks" Mobility    Outcome Measure  Help needed turning from your back to your side while in a flat bed without using bedrails?: A Lot Help needed moving from lying on your back to sitting on the side of a flat bed without using bedrails?: A Lot Help needed moving to and from a bed to a chair (including a wheelchair)?: A Lot Help needed standing up from a chair using your arms (e.g., wheelchair or bedside chair)?: A Lot Help needed to walk in hospital room?: Total Help needed climbing 3-5 steps with a railing? : Total 6 Click Score: 10    End of Session Equipment Utilized During Treatment: Oxygen Activity Tolerance: Patient tolerated treatment well Patient left: in bed;with call bell/phone within reach;with bed alarm set Nurse Communication: Mobility status;Need for lift equipment PT Visit Diagnosis: Other abnormalities of gait and mobility (R26.89)     Time: 1308-6578 PT Time Calculation (min) (ACUTE ONLY): 45 min  Charges:    $Therapeutic Activity: 38-52 mins PT General Charges $$ ACUTE PT VISIT: 1 Visit                     Arlyss Gandy, PT, DPT Acute Rehabilitation Office [phone]    Arlyss Gandy 10/07/2023, 12:37 PM

## 2023-10-07 NOTE — Progress Notes (Signed)
Progress Note  6 Days Post-Op  Subjective: Pt reports he is tolerating diet but had painful BM this morning. Burned like he had eaten hot peppers. He is mobilizing some but has not been out of bed yet. Denies nausea or vomiting. Daughter at bedside this AM.   Objective: Vital signs in last 24 hours: Temp:  [97.3 F (36.3 C)-99 F (37.2 C)] 98.5 F (36.9 C) (02/03 0735) Pulse Rate:  [72-83] 83 (02/03 0735) Resp:  [16-24] 24 (02/03 0735) BP: (107-140)/(56-120) 137/120 (02/03 0735) SpO2:  [88 %-96 %] 91 % (02/03 0735) Weight:  [120 kg] 120 kg (02/03 0408) Last BM Date :  (PTA)  Intake/Output from previous day: 02/02 0701 - 02/03 0700 In: 864.5 [P.O.:480; I.V.:234.5; IV Piggyback:150] Out: 1425 [Urine:1150; Drains:275] Intake/Output this shift: No intake/output data recorded.  PE: General: pleasant, WD, chronically ill appearing male who is laying in bed in NAD Heart: regular, rate, and rhythm. Lungs: Respiratory effort nonlabored on nasal cannula  Abd: soft, appropriately ttp, mild distention, incisions C/D/I, drain with SS fluid    Lab Results:  Recent Labs    10/05/23 1222  WBC 14.3*  HGB 13.5  HCT 42.3  PLT 179   BMET Recent Labs    10/06/23 0401  NA 140  K 4.3  CL 112*  CO2 16*  GLUCOSE 108*  BUN 118*  CREATININE 3.88*  CALCIUM 9.1   PT/INR No results for input(s): "LABPROT", "INR" in the last 72 hours. CMP     Component Value Date/Time   NA 140 10/06/2023 0401   NA 140 01/11/2023 1332   K 4.3 10/06/2023 0401   CL 112 (H) 10/06/2023 0401   CO2 16 (L) 10/06/2023 0401   GLUCOSE 108 (H) 10/06/2023 0401   BUN 118 (H) 10/06/2023 0401   BUN 55 (H) 01/11/2023 1332   CREATININE 3.88 (H) 10/06/2023 0401   CREATININE 1.66 (H) 08/02/2016 1028   CALCIUM 9.1 10/06/2023 0401   PROT 6.4 (L) 10/01/2023 1700   PROT 6.5 01/11/2023 1332   ALBUMIN 3.0 (L) 10/01/2023 1700   ALBUMIN 4.4 01/11/2023 1332   AST 28 10/01/2023 1700   ALT 18 10/01/2023 1700    ALKPHOS 48 10/01/2023 1700   BILITOT 0.7 10/01/2023 1700   BILITOT 0.2 01/11/2023 1332   GFRNONAA 15 (L) 10/06/2023 0401   GFRAA 32 (L) 11/05/2019 1413   Lipase     Component Value Date/Time   LIPASE 29 09/30/2023 0220       Studies/Results: No results found.  Anti-infectives: Anti-infectives (From admission, onward)    Start     Dose/Rate Route Frequency Ordered Stop   10/02/23 1400  piperacillin-tazobactam (ZOSYN) IVPB 2.25 g        2.25 g 100 mL/hr over 30 Minutes Intravenous Every 8 hours 10/02/23 0842     09/30/23 1800  piperacillin-tazobactam (ZOSYN) IVPB 3.375 g  Status:  Discontinued        3.375 g 12.5 mL/hr over 240 Minutes Intravenous Every 8 hours 09/30/23 1723 10/02/23 0842   09/30/23 0800  piperacillin-tazobactam (ZOSYN) IVPB 3.375 g        3.375 g 100 mL/hr over 30 Minutes Intravenous  Once 09/30/23 0742 09/30/23 0959        Assessment/Plan Gangrenous appendicitis   POD6 s/p laparoscopic appendectomy   - tolerating soft diet and having bowel function  - drain ss with 275 cc/24h  - WBC 14K yesterday, afebrile - continue abx through POD7 - CT today with PO  contrast to eval for developing abscess or other post-op complication prior to restarting DOAC - mobilize as tolerated  FEN: soft diet VTE: heparin gtt on hold ID: Zosyn   - per CCM -  AKI on CKD stage IV - Cr improving slowly  Septic shock, resolved Aspiration PNA COPD HTN CAD s/p PCI HFrEF on DOAC for LV thrombus  T2DM  LOS: 7 days     Juliet Rude, [facility] 10/07/2023, 9:12 AM Please see Amion for pager number during day hours 7:00am-4:30pm

## 2023-10-07 NOTE — Evaluation (Signed)
Occupational Therapy Evaluation Patient Details Name: Bobby [name] MRN: [id number] DOB: February 14, 1946 Today's Date: 10/07/2023   History of Present Illness Pt is a 78 y/o male admitted 1/28 with 1 day of intense abdominal pain found on arrival to be due to acute appendicitis, s/plaparoscopic appendectomy.  PMH HTN, CVA, CAD, STEMI 2013 and 2018 s/p PCI, HLD, HFrEF, CKD stage 4, OSA, COPD, high-degree AV block s/p CRT-D   Clinical Impression   Prior to this admission, patient living alone and fully independent. Patient still drove, and enjoys cooking. Currently, patient disoriented to time, tangential in communication, and limited to bed level eval due to need for significant assist during PT session earlier in the day. Patient is currently mod A for ADL management, and needing +2 assist to advance with gait (please read PT note for bed mobility and BSC transfer attempt). Given current level of function, OT recommending rehab at lesser intensive facility < 3 hours. OT will follow acutely.       If plan is discharge home, recommend the following: Two people to help with walking and/or transfers;Two people to help with bathing/dressing/bathroom;Direct supervision/assist for medications management;Direct supervision/assist for financial management;Assist for transportation;Help with stairs or ramp for entrance;Supervision due to cognitive status (initially)    Functional Status Assessment     Equipment Recommendations       Recommendations for Other Services       Precautions / Restrictions Precautions Precautions: Fall Restrictions Weight Bearing Restrictions Per Provider Order: No      Mobility Bed Mobility Overal bed mobility: Needs Assistance             General bed mobility comments: unable to assess without assist    Transfers Overall transfer level: Needs assistance                 General transfer comment: unable to assess independently,      Balance Overall  balance assessment: Needs assistance                                         ADL either performed or assessed with clinical judgement   ADL Overall ADL's : Needs assistance/impaired Eating/Feeding: Set up;Bed level   Grooming: Set up;Bed level   Upper Body Bathing: Moderate assistance;Bed level   Lower Body Bathing: Total assistance;Bed level   Upper Body Dressing : Moderate assistance;Bed level   Lower Body Dressing: Total assistance;Bed level     Toilet Transfer Details (indicate cue type and reason): unable to assess due to no additional help Toileting- Clothing Manipulation and Hygiene: Total assistance;+2 for physical assistance;+2 for safety/equipment;Bed level       Functional mobility during ADLs: Moderate assistance;+2 for safety/equipment;+2 for physical assistance;Cueing for sequencing;Rolling walker (2 wheels);Cueing for safety General ADL Comments: Prior to this admission, patient living alone and fully independent. Patient still drove, and enjoys cooking. Currently, patient disoriented to time, tangential in communication, and limited to bed level eval due to need for significant assist during PT session earlier in the day. Patient is currently mod A for ADL management, and needing +2 assist to advance with gait (please read PT note for bed mobility and BSC transfer attempt). Given current level of function, OT recommending rehab at lesser intensive facility < 3 hours. OT will follow acutely.     Vision Baseline Vision/History: 0 No visual deficits Ability to See in Adequate Light: 0  Adequate Patient Visual Report: No change from baseline Vision Assessment?: No apparent visual deficits Additional Comments: will continue to assess in functional context     Perception Perception: Not tested       Praxis Praxis: Not tested       Pertinent Vitals/Pain Pain Assessment Pain Assessment: Faces Faces Pain Scale: Hurts even more Pain Location:  abdomen Pain Descriptors / Indicators: Sore Pain Intervention(s): Limited activity within patient's tolerance, Monitored during session, Repositioned, Patient requesting pain meds-RN notified     Extremity/Trunk Assessment Upper Extremity Assessment Upper Extremity Assessment: Generalized weakness;Right hand dominant   Lower Extremity Assessment Lower Extremity Assessment: Defer to PT evaluation   Cervical / Trunk Assessment Cervical / Trunk Assessment: Kyphotic   Communication Communication Communication: Difficulty communicating thoughts/reduced clarity of speech Cueing Techniques: Verbal cues   Cognition Arousal: Alert Behavior During Therapy: WFL for tasks assessed/performed Overall Cognitive Status: Impaired/Different from baseline Area of Impairment: Awareness, Orientation, Attention, Memory, Safety/judgement, Following commands, Problem solving                 Orientation Level: Time Current Attention Level: Sustained Memory: Decreased short-term memory Following Commands: Follows one step commands with increased time Safety/Judgement: Decreased awareness of safety, Decreased awareness of deficits Awareness: Emergent Problem Solving: Decreased initiation General Comments: Patient joking and tangential throughout session     General Comments  VSS on 4L [place] at 91% O2, HR 75, BP 103/74 (85)    Exercises     Shoulder Instructions      Home Living Family/patient expects to be discharged to:: Private residence Living Arrangements: Alone Available Help at Discharge: Available PRN/intermittently;Friend(s);Family Type of Home: Apartment Home Access: Stairs to enter Secretary/administrator of Steps: 1 Entrance Stairs-Rails: None Home Layout: One level     Bathroom Shower/Tub: Chief Strategy Officer: Standard     Home Equipment: Shower seat;Grab bars - tub/shower          Prior Functioning/Environment Prior Level of Function :  Independent/Modified Independent             Mobility Comments: independent ADLs Comments: independent, loves to cook, still driving        OT Problem List: Decreased strength;Decreased range of motion;Decreased activity tolerance;Impaired balance (sitting and/or standing);Decreased coordination;Decreased cognition;Decreased safety awareness;Decreased knowledge of use of DME or AE;Cardiopulmonary status limiting activity;Obesity;Increased edema      OT Treatment/Interventions: Self-care/ADL training;Therapeutic exercise;Energy conservation;DME and/or AE instruction;Manual therapy;Therapeutic activities;Cognitive remediation/compensation;Patient/family education;Balance training    OT Goals(Current goals can be found in the care plan section) Acute Rehab OT Goals Patient Stated Goal: to get better OT Goal Formulation: With patient Time For Goal Achievement: 10/21/23 Potential to Achieve Goals: Fair  OT Frequency: Min 1X/week    Co-evaluation              AM-PAC OT "6 Clicks" Daily Activity     Outcome Measure Help from another person eating meals?: A Little Help from another person taking care of personal grooming?: A Little Help from another person toileting, which includes using toliet, bedpan, or urinal?: Total Help from another person bathing (including washing, rinsing, drying)?: A Lot Help from another person to put on and taking off regular upper body clothing?: A Lot Help from another person to put on and taking off regular lower body clothing?: Total 6 Click Score: 12   End of Session Nurse Communication: Mobility status;Patient requests pain meds  Activity Tolerance: Patient tolerated treatment well Patient left: in bed;with call bell/phone within  reach  OT Visit Diagnosis: Other abnormalities of gait and mobility (R26.89);Unsteadiness on feet (R26.81);Muscle weakness (generalized) (M62.81);Other symptoms and signs involving cognitive function;Pain                 Time: 1610-9604 OT Time Calculation (min): 14 min Charges:  OT General Charges $OT Visit: 1 Visit OT Evaluation $OT Eval Moderate Complexity: 1 Mod  Pollyann Glen E. Butler Vegh, OTR/L Acute Rehabilitation Services [phone]   Cherlyn Cushing 10/07/2023, 3:33 PM

## 2023-10-07 NOTE — Progress Notes (Signed)
   NAME:  Bobby [name], MRN:  [id number], DOB:  1946-06-13, LOS: 7 ADMISSION DATE:  09/30/2023, CONSULTATION DATE:  10/01/23 REFERRING MD: Monna Fam, MD CHIEF COMPLAINT:  post op intubation  History of Present Illness:  Bobby [name] is a 78 year old male with numerous comorbidities (see below) who presented to the [facility] with complaints of RLQ pain, and nausea for one day. He was not vomiting.  CT in the ED showed acute appendicitis with appendicolith. Labs notable for WBC 14.5, potassium 5.5, Creatinine 4.48 (baseline ~2.5), lactic 1.4. He was started on zosyn. He was then admitted to medicine and surgery was consulted.    Surgery recommended lap appy but was concerned about his ability to withstand surgery given his multiple comorbidities. Cardiology was consulted and cleared him for surgery. He was also noted to have intermittent confusion after admission but before surgery.    1/28 he underwent laparoscopic appendectomy. Operative findings included necrotic appendix, a 83f blake drain was left in the pelvis. He was not extubated post op and was noted to be acidotic (ph 7.1) intra-op. CCM was consulted for post-op management.  Pertinent  Medical History  DM2, HTN, CVA, COPD on 4L [place] at baseline, CAD, STEMI 2013/2018 with PCI, HFrEF, CKD4, OSA not compliant with CPAP, A-V block with ICD. Current smoker.   Significant Hospital Events: Including procedures, antibiotic start and stop dates in addition to other pertinent events   1/27 admitted for acute appendicitis. Intermittent confusion. Started zosyn 1/28 lap appy, remained extubated post op 1/31 Overnight episodes of transient hypotension to 60s/40s lasting 10-20 seconds with repeated spontaneous recovery. 2/1 seen sitting up in bed in no acute distress, NG-tube removed, tolerated clear liquid diet  Interim History / Subjective:  No events. Slept poorly.  Objective   Blood pressure 101/67, pulse 74, temperature 98.9 F (37.2 C), temperature  source Oral, resp. rate (!) 22, height 5' 9.02" (1.753 m), weight 120 kg, SpO2 91%.        Intake/Output Summary (Last 24 hours) at 10/07/2023 1412 Last data filed at 10/07/2023 0740 Gross per 24 hour  Intake 864.51 ml  Output 1250 ml  Net -385.49 ml   Filed Weights   10/05/23 0323 10/06/23 0439 10/07/23 0408  Weight: 118.8 kg 119 kg 120 kg    Examination: No distress Breath sounds diminished bases Abd minimal TTP Ext warm Heart sounds regular, paced on monitor  No labs today  A Aspiration pneumonia COPD Current Smoker and OSA CAD w/ history of STEMI and PCI Essential HTN  HFrEF on chronic DOAC for LV thrombus prophylaxis- this occurred 7 years ago.  No urgent need to restart. Acute Appendicitis with peritonitis s/p lap appy 1/28 Post-op Ileus Resolved Septic Shock AKI superimposed on CKD IV w/ hyperkalemia and normal anion gap metabolic acidosis.  Diabetes Type 2 not on insulin   P - Avoid nephrotoxic drugs, flirting with need for HD, check AM BMP - Encourage IS, up to chair - Diet advancement and drain per CCS - Nebs to MDI's - GDMT as ordered - Check CT A/P today, if looks okay and AM BMP okay can start eliquis - PT/OT help appreciated - Remains stable to transfer out of ICU  Myrla Halsted MD PCCM

## 2023-10-08 DIAGNOSIS — K353 Acute appendicitis with localized peritonitis, without perforation or gangrene: Secondary | ICD-10-CM | POA: Diagnosis not present

## 2023-10-08 LAB — BASIC METABOLIC PANEL
Anion gap: 13 (ref 5–15)
BUN: 103 mg/dL — ABNORMAL HIGH (ref 8–23)
CO2: 21 mmol/L — ABNORMAL LOW (ref 22–32)
Calcium: 9.3 mg/dL (ref 8.9–10.3)
Chloride: 105 mmol/L (ref 98–111)
Creatinine, Ser: 3.74 mg/dL — ABNORMAL HIGH (ref 0.61–1.24)
GFR, Estimated: 16 mL/min — ABNORMAL LOW (ref >60)
Glucose, Bld: 107 mg/dL — ABNORMAL HIGH (ref 70–99)
Potassium: 3.9 mmol/L (ref 3.5–5.1)
Sodium: 139 mmol/L (ref 135–145)

## 2023-10-08 LAB — GLUCOSE, CAPILLARY
Glucose-Capillary: 113 mg/dL — ABNORMAL HIGH (ref 70–99)
Glucose-Capillary: 91 mg/dL (ref 70–99)
Glucose-Capillary: 107 mg/dL — ABNORMAL HIGH (ref 70–99)
Glucose-Capillary: 165 mg/dL — ABNORMAL HIGH (ref 70–99)
Glucose-Capillary: 132 mg/dL — ABNORMAL HIGH (ref 70–99)

## 2023-10-08 LAB — CBC
HCT: 40.5 % (ref 39.0–52.0)
Hemoglobin: 12.9 g/dL — ABNORMAL LOW (ref 13.0–17.0)
MCH: 28.5 pg (ref 26.0–34.0)
MCHC: 31.9 g/dL (ref 30.0–36.0)
MCV: 89.4 fL (ref 80.0–100.0)
Platelets: 267 10*3/uL (ref 150–400)
RBC: 4.53 MIL/uL (ref 4.22–5.81)
RDW: 14.5 % (ref 11.5–15.5)
WBC: 13 10*3/uL — ABNORMAL HIGH (ref 4.0–10.5)
nRBC: 0 % (ref 0.0–0.2)

## 2023-10-08 MED ORDER — HEPARIN SODIUM (PORCINE) 5000 UNIT/ML IJ SOLN
5000.0000 [IU] | Freq: Three times a day (TID) | INTRAMUSCULAR | Status: DC
Start: 2023-10-08 — End: 2023-10-09
  Administered 2023-10-08 – 2023-10-09 (×3): 5000 [IU] via SUBCUTANEOUS
  Filled 2023-10-08 (×3): qty 1

## 2023-10-08 MED ORDER — ENSURE ENLIVE PO LIQD
237.0000 mL | Freq: Two times a day (BID) | ORAL | Status: DC
  Administered 2023-10-08 – 2023-10-11 (×7): 237 mL via ORAL

## 2023-10-08 MED ORDER — ONDANSETRON HCL 4 MG/2ML IJ SOLN
4.0000 mg | Freq: Four times a day (QID) | INTRAMUSCULAR | Status: DC | PRN
  Administered 2023-10-08: 4 mg via INTRAVENOUS
  Filled 2023-10-08: qty 2

## 2023-10-08 MED ORDER — METOCLOPRAMIDE HCL 5 MG/ML IJ SOLN
5.0000 mg | Freq: Three times a day (TID) | INTRAMUSCULAR | Status: AC
  Administered 2023-10-08 – 2023-10-10 (×5): 5 mg via INTRAVENOUS
  Filled 2023-10-08 (×5): qty 2

## 2023-10-08 MED ORDER — PIPERACILLIN-TAZOBACTAM IN DEX 2-0.25 GM/50ML IV SOLN
2.2500 g | Freq: Three times a day (TID) | INTRAVENOUS | Status: AC
  Administered 2023-10-08 (×2): 2.25 g via INTRAVENOUS
  Filled 2023-10-08 (×2): qty 50

## 2023-10-08 NOTE — Progress Notes (Signed)
 eLink Physician-Brief Progress Note Patient Name: Bobby [name] DOB: Jan 05, 1946 MRN: [id number]   Date of Service  10/08/2023  HPI/Events of Note  78 y/o male admitted 1/28 with 1 day of intense abdominal pain found on arrival to be due to acute appendicitis, s/plaparoscopic appendectomy. PMH HTN, CVA, CAD, STEMI 2013 and 2018 s/p PCI, HLD, HFrEF, CKD stage 4, OSA, COPD, high-degree AV block s/p CRT-D   Nausea/Vomiting  eICU Interventions  Add Zofran      Intervention Category Minor Interventions: Routine modifications to care plan (e.g. PRN medications for pain, fever)  Dagmar Adcox 10/08/2023, 12:21 AM

## 2023-10-08 NOTE — Progress Notes (Signed)
 Progress Note  7 Days Post-Op  Subjective: Pt vomited once overnight but reports this was secondary to reflux after eating an orange. Denies nausea this AM. Reports having had 2 BM yesterday. Reports abdomen feels close to baseline.   Objective: Vital signs in last 24 hours: Temp:  [97.4 F (36.3 C)-98.9 F (37.2 C)] 97.4 F (36.3 C) (02/04 0800) Pulse Rate:  [74-184] 82 (02/04 0800) Resp:  [17-32] 19 (02/04 0800) BP: (101-151)/(55-72) 125/72 (02/04 0800) SpO2:  [91 %-100 %] 95 % (02/04 0807) Weight:  [118.5 kg] 118.5 kg (02/04 0500) Last BM Date : 10/07/23  Intake/Output from previous day: 02/03 0701 - 02/04 0700 In: 390 [P.O.:240; IV Piggyback:150] Out: 1961 [Urine:1700; Drains:260; Stool:1] Intake/Output this shift: No intake/output data recorded.  PE: General: pleasant, WD, chronically ill appearing male who is laying in bed in NAD Heart: regular, rate, and rhythm. Lungs: Respiratory effort nonlabored on nasal cannula  Abd: soft, appropriately ttp, moderate distention, incisions C/D/I, drain with serous fluid    Lab Results:  Recent Labs    10/05/23 1222 10/08/23 0310  WBC 14.3* 13.0*  HGB 13.5 12.9*  HCT 42.3 40.5  PLT 179 267   BMET Recent Labs    10/06/23 0401 10/08/23 0310  NA 140 139  K 4.3 3.9  CL 112* 105  CO2 16* 21*  GLUCOSE 108* 107*  BUN 118* 103*  CREATININE 3.88* 3.74*  CALCIUM  9.1 9.3   PT/INR No results for input(s): LABPROT, INR in the last 72 hours. CMP     Component Value Date/Time   NA 139 10/08/2023 0310   NA 140 01/11/2023 1332   K 3.9 10/08/2023 0310   CL 105 10/08/2023 0310   CO2 21 (L) 10/08/2023 0310   GLUCOSE 107 (H) 10/08/2023 0310   BUN 103 (H) 10/08/2023 0310   BUN 55 (H) 01/11/2023 1332   CREATININE 3.74 (H) 10/08/2023 0310   CREATININE 1.66 (H) 08/02/2016 1028   CALCIUM  9.3 10/08/2023 0310   PROT 6.4 (L) 10/01/2023 1700   PROT 6.5 01/11/2023 1332   ALBUMIN  3.0 (L) 10/01/2023 1700   ALBUMIN  4.4  01/11/2023 1332   AST 28 10/01/2023 1700   ALT 18 10/01/2023 1700   ALKPHOS 48 10/01/2023 1700   BILITOT 0.7 10/01/2023 1700   BILITOT 0.2 01/11/2023 1332   GFRNONAA 16 (L) 10/08/2023 0310   GFRAA 32 (L) 11/05/2019 1413   Lipase     Component Value Date/Time   LIPASE 29 09/30/2023 0220       Studies/Results: CT ABDOMEN PELVIS WO CONTRAST Result Date: 10/07/2023 CLINICAL DATA:  Abdominal pain postoperative EXAM: CT ABDOMEN AND PELVIS WITHOUT CONTRAST TECHNIQUE: Multidetector CT imaging of the abdomen and pelvis was performed following the standard protocol without IV contrast. RADIATION DOSE REDUCTION: This exam was performed according to the departmental dose-optimization program which includes automated exposure control, adjustment of the mA and/or kV according to patient size and/or use of iterative reconstruction technique. COMPARISON:  09/30/2023 FINDINGS: Lower chest: Small bilateral pleural effusions with basilar atelectasis. Mild cardiac enlargement. Hepatobiliary: No focal liver abnormality is seen. No gallstones, gallbladder wall thickening, or biliary dilatation. Pancreas: Unremarkable. No pancreatic ductal dilatation or surrounding inflammatory changes. Spleen: Normal in size without focal abnormality. Adrenals/Urinary Tract: No adrenal gland nodules. Bilateral renal cysts. Largest in the left lower pole measures 5.9 cm in diameter. No change since prior study. No imaging follow-up is indicated. 1.3 cm diameter hyperdense cyst in the left kidney is unchanged since prior study  and likely represents a hemorrhagic cyst. No imaging follow-up is indicated. No hydronephrosis or hydroureter. No renal, ureteral, or bladder stones. Bladder is decompressed with a Foley catheter. Stomach/Bowel: Stomach is filled with ingested material and contrast material. Proximal small bowel are mildly dilated with contrast fluid levels. Terminal ileum is decompressed. This may indicate early small-bowel  obstruction or ileus. Scattered stool and liquid stool throughout the colon. No wall thickening or inflammatory changes. No colonic wall thickening. Interval resection of the appendix with free fluid and stranding in the right lower quadrant and along the pericolic gutters. A surgical drain is in place. No loculated collections are identified. Vascular/Lymphatic: Aortic atherosclerosis. No enlarged abdominal or pelvic lymph nodes. Reproductive: Prostate is unremarkable. Penile prosthesis with right groin reservoir. Other: No free air in the abdomen. Abdominal wall musculature appears intact. Infiltration in the subcutaneous fat is likely edema or postoperative change. Musculoskeletal: Degenerative changes in the spine. No acute bony abnormalities. IMPRESSION: 1. Postoperative appendectomy with residual fluid and stranding in the right lower quadrant. Right lower quadrant drainage catheter in place. No loculated collections. 2. Mildly dilated fluid-filled small bowel with distal decompression may represent early obstruction or ileus. 3. Aortic atherosclerosis. 4. Small bilateral pleural effusions with basilar atelectasis. Electronically Signed   By: Elsie Gravely M.D.   On: 10/07/2023 17:52    Anti-infectives: Anti-infectives (From admission, onward)    Start     Dose/Rate Route Frequency Ordered Stop   10/08/23 1400  piperacillin -tazobactam (ZOSYN ) IVPB 2.25 g        2.25 g 100 mL/hr over 30 Minutes Intravenous Every 8 hours 10/08/23 0805 10/09/23 0559   10/02/23 1400  piperacillin -tazobactam (ZOSYN ) IVPB 2.25 g  Status:  Discontinued        2.25 g 100 mL/hr over 30 Minutes Intravenous Every 8 hours 10/02/23 0842 10/08/23 0805   09/30/23 1800  piperacillin -tazobactam (ZOSYN ) IVPB 3.375 g  Status:  Discontinued        3.375 g 12.5 mL/hr over 240 Minutes Intravenous Every 8 hours 09/30/23 1723 10/02/23 0842   09/30/23 0800  piperacillin -tazobactam (ZOSYN ) IVPB 3.375 g        3.375 g 100 mL/hr over  30 Minutes Intravenous  Once 09/30/23 9257 09/30/23 0959        Assessment/Plan Gangrenous appendicitis   POD7 s/p laparoscopic appendectomy   - having bowel function, continue soft diet as tolerated - if further n/v then check KUB  - drain serous with 260 cc/24h  - WBC 13K, trending down, afebrile - ok to stop abx today  - CT with residual fluid and stranding but no abscess or loculated fluid, mildly dilated small bowel  - mobilize as tolerated  FEN: soft diet VTE: heparin  gtt on hold ID: Zosyn  to stop today   - per CCM -  AKI on CKD stage IV - Cr improving slowly  Septic shock, resolved Aspiration PNA COPD HTN CAD s/p PCI HFrEF on DOAC for LV thrombus  T2DM  LOS: 8 days     Burnard JONELLE Louder, [facility] Surgery 10/08/2023, 8:21 AM Please see Amion for pager number during day hours 7:00am-4:30pm

## 2023-10-08 NOTE — Progress Notes (Signed)
   NAME:  Bobby [name], MRN:  [id number], DOB:  10-13-1945, LOS: 8 ADMISSION DATE:  09/30/2023, CONSULTATION DATE:  10/01/23 REFERRING MD: Redell Burnet, MD CHIEF COMPLAINT:  post op intubation  History of Present Illness:  Mr. Bobby [name] is a 78 year old male with numerous comorbidities (see below) who presented to the [facility] with complaints of RLQ pain, and nausea for one day. He was not vomiting.  CT in the ED showed acute appendicitis with appendicolith. Labs notable for WBC 14.5, potassium 5.5, Creatinine 4.48 (baseline ~2.5), lactic 1.4. He was started on zosyn . He was then admitted to medicine and surgery was consulted.    Surgery recommended lap appy but was concerned about his ability to withstand surgery given his multiple comorbidities. Cardiology was consulted and cleared him for surgery. He was also noted to have intermittent confusion after admission but before surgery.    1/28 he underwent laparoscopic appendectomy. Operative findings included necrotic appendix, a 83f blake drain was left in the pelvis. He was not extubated post op and was noted to be acidotic (ph 7.1) intra-op. CCM was consulted for post-op management.  Pertinent  Medical History  DM2, HTN, CVA, COPD on 4L [place] at baseline, CAD, STEMI 2013/2018 with PCI, HFrEF, CKD4, OSA not compliant with CPAP, A-V block with ICD. Current smoker.   Significant Hospital Events: Including procedures, antibiotic start and stop dates in addition to other pertinent events   1/27 admitted for acute appendicitis. Intermittent confusion. Started zosyn  1/28 lap appy, remained extubated post op 1/31 Overnight episodes of transient hypotension to 60s/40s lasting 10-20 seconds with repeated spontaneous recovery. 2/1 seen sitting up in bed in no acute distress, NG-tube removed, tolerated clear liquid diet  Interim History / Subjective:  Had some nausea last night and this am. Moving bowels Pain under control Very weak  Objective   Blood pressure  (!) 144/61, pulse 77, temperature 98.1 F (36.7 C), temperature source Axillary, resp. rate (!) 30, height 5' 9.02 (1.753 m), weight 118.5 kg, SpO2 97%.        Intake/Output Summary (Last 24 hours) at 10/08/2023 1256 Last data filed at 10/08/2023 1200 Gross per 24 hour  Intake 330 ml  Output 1881 ml  Net -1551 ml   Filed Weights   10/06/23 0439 10/07/23 0408 10/08/23 0500  Weight: 119 kg 120 kg 118.5 kg    Examination: No distress Occasional rhonci, nonlabored breathing pattern on 3LPM Abd soft, minimally tender hypoactive BS Moves to command JP in place  Possible early ileus on CT, no abscess  A Aspiration pneumonia COPD Current Smoker and OSA CAD w/ history of STEMI and PCI Essential HTN  HFrEF on chronic DOAC for LV thrombus prophylaxis- this occurred 7 years ago.  No urgent need to restart. Acute Appendicitis with peritonitis s/p lap appy 1/28 Post-op Ileus Resolved Septic Shock AKI superimposed on CKD IV w/ hyperkalemia and normal anion gap metabolic acidosis.  Diabetes Type 2 not on insulin    P - BMPs are stable, can DC checks - Encourage IS, up to chair, will need SNF - Diet advancement and drain per CCS, some nausea will keep an eye on - Nebs to MDI's - GDMT as ordered - PT/OT help appreciated - Remains stable to transfer out of ICU: remaining issue prior to SNF placement is diet tolerance - Can restart NoAC on DC  Rolan Sharps MD PCCM

## 2023-10-08 NOTE — TOC Progression Note (Addendum)
 Transition of Care [facility]) - Progression Note    Patient Details  Name: Bobby [name] MRN: [id number] Date of Birth: 09-17-1945  Transition of Care [facility]) CM/SW Contact  Luann SHAUNNA [place], [place] Phone Number: 10/08/2023, 10:37 AM  Clinical Narrative:     CSW met with pt bedside and discussed SNF rec. Pt agreeable to SNF work up. CSW explained medicare coverage and SNF auth process. Fl2 completed and bed requests sent in hub. Pt is agreeable to CSW updating his daughter. PASRR is pending.   Called and updated daughter. She would be interested in [employer].   Expected Discharge Plan: Skilled Nursing Facility Barriers to Discharge: Continued Medical Work up  Expected Discharge Plan and Services   Discharge Planning Services: CM Consult Post Acute Care Choice:  (Await treatment plan and PT/OT recommendations) Living arrangements for the past 2 months: Apartment                 DME Arranged: N/A DME Agency: NA       HH Arranged:  (Await treatment plan and PT/OT recommendations)           Social Determinants of Health (SDOH) Interventions SDOH Screenings   Food Insecurity: No Food Insecurity (09/30/2023)  Housing: Low Risk  (09/30/2023)  Transportation Needs: No Transportation Needs (09/30/2023)  Utilities: Not At Risk (09/30/2023)  Social Connections: Socially Isolated (09/30/2023)  Tobacco Use: High Risk (10/02/2023)   Received from [facility]    Readmission Risk Interventions    09/11/2021   12:13 PM  Readmission Risk Prevention Plan  Transportation Screening Complete  PCP or Specialist Appt within 3-5 Days Complete  HRI or Home Care Consult Complete  Social Work Consult for Recovery Care Planning/Counseling Complete  Palliative Care Screening Not Applicable  Medication Review [employer]) Complete

## 2023-10-08 NOTE — Progress Notes (Signed)
 Nutrition Follow-up  DOCUMENTATION CODES:   Obesity unspecified  INTERVENTION:  Continue GI soft diet per Surgery Ensure Plus High Protein po BID, each supplement provides 350 kcal and 20 grams of protein. Magic cup TID with meals, each supplement provides 290 kcal and 9 grams of protein  NUTRITION DIAGNOSIS:   Inadequate oral intake related to inability to eat as evidenced by NPO status. - progressing via diet advancement  GOAL:   Patient will meet greater than or equal to 90% of their needs - goal unmet  MONITOR:   TF tolerance, I & O's, Vent status, Labs  REASON FOR ASSESSMENT:   Ventilator, Consult Enteral/tube feeding initiation and management  ASSESSMENT:   Pt with hx of HLD, HTN, COPD on 4L home O2, CKD IV, prior CVA, CHF, CAD s/p PCI, and DM type 2 presented to ED with abdominal pain. Found to have acute appendicitis and taken emergently to OR.  1/27 - presented to ED 1/28 - laparoscopic appendectomy, left intubated after surgery for metabolic acidemia 1/29 - cortrak placed 1/30 - cortrak removed, NGT placed to suction 1/31 - extubated; diet advanced to clear liquids 2/1 - NGT removed; diet advanced to full liquids 2/2 - diet advanced to GI soft  Pt sitting up in bedside recliner at time of visit. RN's present providing patient care at time of visit.  Pt eating poorly secondary to low appetite and early satiety. Ate a few bites of breakfast this morning and not much yesterday either.  Pt had an episode of emesis last night which he relates to reflux after eating an orange. No further episodes of n/v today.   RN reports pt with 2 small BM's since yesterday. Abdomen appears distended however pt reports that it feels better today.   Pt is agreeable to nutrition supplements to augment PO intake.   Meal completions: 2/1: 100% breakfast, 90% lunch (full liquids) 2/3: 10% breakfast, 0% lunch (soft)  Admit weight: 119.9 kg Current weight: 118.5  kg  Drains/lines: UOP: x24 hours Midline abd JP drain: x24 hours  Medications: SSI 0-20 units q4h, rena-vit, lovaza , protonix , IV abx  Labs: BUN 103 Cr 3.74 GFR 16 CBG's 91-133 x24 hours  Diet Order:   Diet Order             DIET SOFT Room service appropriate? Yes; Fluid consistency: Thin  Diet effective now                   EDUCATION NEEDS:   Education needs have been addressed  Skin:  Skin Assessment: Reviewed RN Assessment  Last BM:  2/4 type 4 small  Height:   Ht Readings from Last 1 Encounters:  10/01/23 5' 9.02 (1.753 m)    Weight:   Wt Readings from Last 1 Encounters:  10/08/23 118.5 kg    Ideal Body Weight:  72.7 kg  BMI:  Body mass index is 38.56 kg/m.  Estimated Nutritional Needs:   Kcal:  2000-2300 kcal/d  Protein:  110-125g/d  Fluid:  2L/d  Royce Maris, RDN, LDN Clinical Nutrition
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
## 2023-10-08 NOTE — Progress Notes (Signed)
       RE:   Bobby [name]  Date of Birth:  [birth year]  Date:   10/08/2023    To Whom It May Concern:  Please be advised that the above-named patient will require a short-term nursing home stay - anticipated 30 days or less for rehabilitation and strengthening.  The plan is for return home.

## 2023-10-09 DIAGNOSIS — J449 Chronic obstructive pulmonary disease, unspecified: Secondary | ICD-10-CM

## 2023-10-09 DIAGNOSIS — K353 Acute appendicitis with localized peritonitis, without perforation or gangrene: Secondary | ICD-10-CM | POA: Diagnosis not present

## 2023-10-09 LAB — GLUCOSE, CAPILLARY
Glucose-Capillary: 102 mg/dL — ABNORMAL HIGH (ref 70–99)
Glucose-Capillary: 104 mg/dL — ABNORMAL HIGH (ref 70–99)
Glucose-Capillary: 105 mg/dL — ABNORMAL HIGH (ref 70–99)
Glucose-Capillary: 127 mg/dL — ABNORMAL HIGH (ref 70–99)
Glucose-Capillary: 193 mg/dL — ABNORMAL HIGH (ref 70–99)
Glucose-Capillary: 86 mg/dL (ref 70–99)

## 2023-10-09 MED ORDER — APIXABAN 5 MG PO TABS
5.0000 mg | ORAL_TABLET | Freq: Two times a day (BID) | ORAL | Status: DC
  Administered 2023-10-09 – 2023-10-11 (×5): 5 mg via ORAL
  Filled 2023-10-09 (×5): qty 1

## 2023-10-09 NOTE — Discharge Instructions (Addendum)
 LAPAROSCOPIC SURGERY: POST OP INSTRUCTIONS Always review your discharge instruction sheet given to you by the facility where your surgery was performed. IF YOU HAVE DISABILITY OR FAMILY LEAVE FORMS, YOU MUST BRING THEM TO THE OFFICE FOR PROCESSING.   DO NOT GIVE THEM TO YOUR DOCTOR.  PAIN CONTROL  First take acetaminophen  (Tylenol ) AND/or ibuprofen  (Advil ) to control your pain after surgery.  Follow directions on package.  Taking acetaminophen  (Tylenol ) and/or ibuprofen  (Advil ) regularly after surgery will help to control your pain and lower the amount of prescription pain medication you may need.  You should not take more than 3,000 mg (3 grams) of acetaminophen  (Tylenol ) in 24 hours.  You should not take ibuprofen  (Advil ), aleve, motrin , naprosyn or other NSAIDS if you have a history of stomach ulcers or chronic kidney disease.  A prescription for pain medication may be given to you upon discharge.  Take your pain medication as prescribed, if you still have uncontrolled pain after taking acetaminophen  (Tylenol ) or ibuprofen  (Advil ). Use ice packs to help control pain. If you need a refill on your pain medication, please contact your pharmacy.  They will contact our office to request authorization. Prescriptions will not be filled after 5pm or on week-ends.  HOME MEDICATIONS Take your usually prescribed medications unless otherwise directed.  DIET You should follow a light diet the first few days after arrival home.  Be sure to include lots of fluids daily. Avoid fatty, fried foods.   CONSTIPATION It is common to experience some constipation after surgery and if you are taking pain medication.  Increasing fluid intake and taking a stool softener (such as Colace) will usually help or prevent this problem from occurring.  A mild laxative (Milk of Magnesia or Miralax ) should be taken according to package instructions if there are no bowel movements after 48 hours.  WOUND/INCISION CARE Most  patients will experience some swelling and bruising in the area of the incisions.  Ice packs will help.  Swelling and bruising can take several days to resolve.  Unless discharge instructions indicate otherwise, follow guidelines below  STERI-STRIPS - you may remove your outer bandages 48 hours after surgery, and you may shower at that time.  You have steri-strips (small skin tapes) in place directly over the incision.  These strips should be left on the skin for 7-10 days.   DERMABOND/SKIN GLUE - you may shower in 24 hours.  The glue will flake off over the next 2-3 weeks. Any sutures or staples will be removed at the office during your follow-up visit.  ACTIVITIES You may resume regular (light) daily activities beginning the next day--such as daily self-care, walking, climbing stairs--gradually increasing activities as tolerated.  You may have sexual intercourse when it is comfortable.  Refrain from any heavy lifting or straining until approved by your doctor. You may drive when you are no longer taking prescription pain medication, you can comfortably wear a seatbelt, and you can safely maneuver your car and apply brakes.  FOLLOW-UP You should see your doctor in the office for a follow-up appointment approximately 2-3 weeks after your surgery.  You should have been given your post-op/follow-up appointment when your surgery was scheduled.  If you did not receive a post-op/follow-up appointment, make sure that you call for this appointment within a day or two after you arrive home to insure a convenient appointment time.   WHEN TO CALL YOUR DOCTOR: Fever over 101.0 Inability to urinate Continued bleeding from incision. Increased pain, redness, or drainage  from the incision. Increasing abdominal pain  The clinic staff is available to answer your questions during regular business hours.  Please don't hesitate to call and ask to speak to one of the nurses for clinical concerns.  If you have a  medical emergency, go to the nearest emergency room or call 911.  A surgeon from [facility] is always on call at the hospital. [address], [place], [place]  [address] ? [address] [address], [place], [place]   [address] [phone] ? [phone] ? FAX [phone] Web site: [email or web address]   Information on my medicine - ELIQUIS  (apixaban )  This medication education was reviewed with me or my healthcare representative as part of my discharge preparation.   Why was Eliquis  prescribed for you? Eliquis  was prescribed to treat blood clots that may have been found in the veins of your legs (deep vein thrombosis) or in your lungs (pulmonary embolism) and to reduce the risk of them occurring again.  What do You need to know about Eliquis  ? Continue Eliquis  5 mg tablet taken TWICE daily.  Eliquis  may be taken with or without food.   Try to take the dose about the same time in the morning and in the evening. If you have difficulty swallowing the tablet whole please discuss with your pharmacist how to take the medication safely.  Take Eliquis  exactly as prescribed and DO NOT stop taking Eliquis  without talking to the doctor who prescribed the medication.  Stopping may increase your risk of developing a new blood clot.  Refill your prescription before you run out.  After discharge, you should have regular check-up appointments with your healthcare provider that is prescribing your Eliquis .    What do you do if you miss a dose? If a dose of ELIQUIS  is not taken at the scheduled time, take it as soon as possible on the same day and twice-daily administration should be resumed. The dose should not be doubled to make up for a missed dose.  Important Safety Information A possible side effect of Eliquis  is bleeding. You should call your healthcare provider right away if you experience any of the following: Bleeding from an injury or your nose that does  not stop. Unusual colored urine (red or dark brown) or unusual colored stools (red or black). Unusual bruising for unknown reasons. A serious fall or if you hit your head (even if there is no bleeding).  Some medicines may interact with Eliquis  and might increase your risk of bleeding or clotting while on Eliquis . To help avoid this, consult your healthcare provider or pharmacist prior to using any new prescription or non-prescription medications, including herbals, vitamins, non-steroidal anti-inflammatory drugs (NSAIDs) and supplements.  This website has more information on Eliquis  (apixaban ): [email or web address] .[email or web address] dena

## 2023-10-09 NOTE — Progress Notes (Signed)
 Physical Therapy Treatment Patient Details Name: Bobby [name] MRN: [id number] DOB: Jun 20, 1946 Today's Date: 10/09/2023   History of Present Illness Pt is a 78 y/o male admitted 1/28 with 1 day of intense abdominal pain found on arrival to be due to acute appendicitis, s/plaparoscopic appendectomy.  PMH HTN, CVA, CAD, STEMI 2013 and 2018 s/p PCI, HLD, HFrEF, CKD stage 4, OSA, COPD, high-degree AV block s/p CRT-D    PT Comments  Pt tolerates treatment well, demonstrating improved LE strength and power in transfers. Pt's cognition also appears to be improving with more appropriate and less tangential conversation. Pt is able to transfer multiple times during session but fatigues quickly, preventing progression to gait training. PT provides LE exercise in an effort to improve strength and endurance.    If plan is discharge home, recommend the following: A lot of help with walking and/or transfers;A lot of help with bathing/dressing/bathroom;Assistance with cooking/housework;Assist for transportation;Help with stairs or ramp for entrance   Can travel by private vehicle     No  Equipment Recommendations  Wheelchair (measurements PT);Hoyer lift;Hospital bed (if discharging home)    Recommendations for Other Services       Precautions / Restrictions Precautions Precautions: Fall Precaution Comments: JP drain abdomen Restrictions Weight Bearing Restrictions Per Provider Order: No     Mobility  Bed Mobility Overal bed mobility: Needs Assistance Bed Mobility: Rolling, Sidelying to Sit Rolling: Mod assist Sidelying to sit: Mod assist, HOB elevated            Transfers Overall transfer level: Needs assistance Equipment used: 1 person hand held assist Transfers: Sit to/from Stand, Bed to chair/wheelchair/BSC Sit to Stand: Mod assist   Step pivot transfers: Mod assist       General transfer comment: pt stands twice and performs one step-pivot transfer during session. Improved  anterior weight shift and power noted through LEs    Ambulation/Gait Ambulation/Gait assistance:  (deferred due to pt reported fatigue)                 Stairs             Wheelchair Mobility     Tilt Bed    Modified Rankin (Stroke Patients Only)       Balance Overall balance assessment: Needs assistance Sitting-balance support: No upper extremity supported, Feet supported Sitting balance-Leahy Scale: Fair     Standing balance support: Bilateral upper extremity supported, Reliant on assistive device for balance Standing balance-Leahy Scale: Poor Standing balance comment: min-modA                            Cognition Arousal: Alert Behavior During Therapy: WFL for tasks assessed/performed Overall Cognitive Status: Impaired/Different from baseline Area of Impairment: Orientation, Awareness, Safety/judgement                 Orientation Level: Disoriented to, Time Current Attention Level: Sustained     Safety/Judgement: Decreased awareness of deficits Awareness: Emergent            Exercises General Exercises - Lower Extremity Ankle Circles/Pumps: AROM, Both, 10 reps Long Arc Quad: AROM, Both, 5 reps Hip Flexion/Marching: AROM, Both, 5 reps    General Comments General comments (skin integrity, edema, etc.): VSS on 3L [place]      Pertinent Vitals/Pain Pain Assessment Pain Assessment: Faces Faces Pain Scale: Hurts little more Pain Location: abdomen Pain Descriptors / Indicators: Sore Pain Intervention(s): Monitored during session  Home Living                          Prior Function            PT Goals (current goals can now be found in the care plan section) Acute Rehab PT Goals Patient Stated Goal: back home Progress towards PT goals: Progressing toward goals    Frequency    Min 1X/week      PT Plan      Co-evaluation              AM-PAC PT 6 Clicks Mobility   Outcome Measure  Help  needed turning from your back to your side while in a flat bed without using bedrails?: A Lot Help needed moving from lying on your back to sitting on the side of a flat bed without using bedrails?: A Lot Help needed moving to and from a bed to a chair (including a wheelchair)?: A Lot Help needed standing up from a chair using your arms (e.g., wheelchair or bedside chair)?: A Lot Help needed to walk in hospital room?: Total Help needed climbing 3-5 steps with a railing? : Total 6 Click Score: 10    End of Session Equipment Utilized During Treatment: Gait belt Activity Tolerance: Patient limited by fatigue Patient left: in chair;with call bell/phone within reach;with chair alarm set Nurse Communication: Mobility status PT Visit Diagnosis: Other abnormalities of gait and mobility (R26.89)     Time: 1020-1046 PT Time Calculation (min) (ACUTE ONLY): 26 min  Charges:    $Therapeutic Exercise: 8-22 mins $Therapeutic Activity: 8-22 mins PT General Charges $$ ACUTE PT VISIT: 1 Visit                     Bobby [name], PT, DPT Acute Rehabilitation Office [phone]    Bobby [name] 10/09/2023, 11:25 AM

## 2023-10-09 NOTE — Progress Notes (Addendum)
 Occupational Therapy Treatment Patient Details Name: Bobby [name] MRN: [id number] DOB: November 16, 1945 Today's Date: 10/09/2023   History of present illness Pt is a 78 y/o male admitted 1/28 with 1 day of intense abdominal pain found on arrival to be due to acute appendicitis, s/plaparoscopic appendectomy.  PMH HTN, CVA, CAD, STEMI 2013 and 2018 s/p PCI, HLD, HFrEF, CKD stage 4, OSA, COPD, high-degree AV block s/p CRT-D   OT comments  Pt progressing towards goals, able to sit EOB x10 min with mod A for bed mobility, completed seated BUE/BLE therex at EOB, pt declines ADLs this session. Pt mod +2 with HHA for standing and taking 3-4 side steps at EOB, pt flexes trunk as he fatigues. Pt presenting with impairments listed below, will follow acutely. Patient will benefit from continued inpatient follow up therapy, <3 hours/day to maximize safety/ind with ADL/functional mobility.       If plan is discharge home, recommend the following:  Two people to help with walking and/or transfers;Two people to help with bathing/dressing/bathroom;Direct supervision/assist for medications management;Direct supervision/assist for financial management;Assist for transportation;Help with stairs or ramp for entrance;Supervision due to cognitive status   Equipment Recommendations  Other (comment) (defer)    Recommendations for Other Services PT consult    Precautions / Restrictions Precautions Precautions: Fall Precaution Comments: JP drain abdomen Restrictions Weight Bearing Restrictions Per Provider Order: No       Mobility Bed Mobility Overal bed mobility: Needs Assistance Bed Mobility: Rolling, Sidelying to Sit Rolling: Mod assist Sidelying to sit: Mod assist, HOB elevated     Sit to sidelying: Mod assist, Max assist      Transfers Overall transfer level: Needs assistance Equipment used: 2 person hand held assist Transfers: Sit to/from Stand, Bed to chair/wheelchair/BSC Sit to Stand: Mod assist,  +2 physical assistance           General transfer comment: pt taking 3-4 side steps to L at EOB     Balance Overall balance assessment: Needs assistance Sitting-balance support: No upper extremity supported, Feet supported Sitting balance-Leahy Scale: Fair     Standing balance support: Bilateral upper extremity supported, Reliant on assistive device for balance Standing balance-Leahy Scale: Poor Standing balance comment: min-modA                           ADL either performed or assessed with clinical judgement   ADL Overall ADL's : Needs assistance/impaired                         Toilet Transfer: Moderate assistance;+2 for physical assistance Toilet Transfer Details (indicate cue type and reason): simulated side stepping toward [facility]                Extremity/Trunk Assessment Upper Extremity Assessment Upper Extremity Assessment: Generalized weakness   Lower Extremity Assessment Lower Extremity Assessment: Defer to PT evaluation        Vision   Vision Assessment?: No apparent visual deficits Additional Comments: appears WFL, but will continue to assess   Perception Perception Perception: Not tested   Praxis Praxis Praxis: Not tested    Cognition Arousal: Alert Behavior During Therapy: [facility] for tasks assessed/performed Overall Cognitive Status: Impaired/Different from baseline Area of Impairment: Orientation, Awareness, Safety/judgement                 Orientation Level: Disoriented to, Time Current Attention Level: Sustained     Safety/Judgement: Decreased awareness of  deficits Awareness: Emergent   General Comments: needs incr cues to attempt        Exercises Exercises: Other exercises Other Exercises Other Exercises: seated leg kicks x20 Other Exercises: seated functional reach x20    Shoulder Instructions       General Comments VSS on 3L O2    Pertinent Vitals/ Pain       Pain Assessment Pain Assessment:  Faces Pain Score: 4  Faces Pain Scale: Hurts little more Pain Location: abdomen Pain Descriptors / Indicators: Sore Pain Intervention(s): Limited activity within patient's tolerance, Monitored during session, Repositioned  Home Living                                          Prior Functioning/Environment              Frequency  Min 1X/week        Progress Toward Goals  OT Goals(current goals can now be found in the care plan section)  Progress towards OT goals: Progressing toward goals  Acute Rehab OT Goals Patient Stated Goal: none stated OT Goal Formulation: With patient Time For Goal Achievement: 10/21/23 Potential to Achieve Goals: Fair ADL Goals Pt Will Perform Lower Body Bathing: sitting/lateral leans;sit to/from stand;with adaptive equipment;with mod assist Pt Will Perform Lower Body Dressing: with mod assist;sitting/lateral leans;sit to/from stand;with adaptive equipment Pt Will Transfer to Toilet: with mod assist;stand pivot transfer;bedside commode Pt Will Perform Toileting - Clothing Manipulation and hygiene: with mod assist;sitting/lateral leans;sit to/from stand;with adaptive equipment Additional ADL Goal #1: Patient will be able to follow 2 step commands as a precursor to upper leve cognitive tasks. Additional ADL Goal #2: Patient will complete bed mobility at mod A as a precursor to OOB activity.  Plan      Co-evaluation                 AM-PAC OT 6 Clicks Daily Activity     Outcome Measure   Help from another person eating meals?: A Little Help from another person taking care of personal grooming?: A Little Help from another person toileting, which includes using toliet, bedpan, or urinal?: Total Help from another person bathing (including washing, rinsing, drying)?: A Lot Help from another person to put on and taking off regular upper body clothing?: A Lot Help from another person to put on and taking off regular lower  body clothing?: Total 6 Click Score: 12    End of Session    OT Visit Diagnosis: Other abnormalities of gait and mobility (R26.89);Unsteadiness on feet (R26.81);Muscle weakness (generalized) (M62.81);Other symptoms and signs involving cognitive function;Pain   Activity Tolerance Patient tolerated treatment well   Patient Left in bed;with call bell/phone within reach;with bed alarm set; NSG requested pt in bed as pt transferring rooms soon   Nurse Communication Mobility status        Time: 1425-1444 OT Time Calculation (min): 19 min  Charges: OT General Charges $OT Visit: 1 Visit OT Treatments $Self Care/Home Management : 8-22 mins  Anamarie Hunn K, OTD, OTR/L [facility] Acute Re[facility] [phone]   Lacora Folmer K Koonce 10/09/2023, 3:01 PM

## 2023-10-09 NOTE — Plan of Care (Signed)
  Problem: Skin Integrity: Goal: Risk for impaired skin integrity will decrease Outcome: Progressing   Problem: Coping: Goal: Ability to adjust to condition or change in health will improve Outcome: Progressing   Problem: Clinical Measurements: Goal: Diagnostic test results will improve Outcome: Progressing   Problem: Pain Managment: Goal: General experience of comfort will improve and/or be controlled Outcome: Progressing

## 2023-10-09 NOTE — Plan of Care (Signed)
  Problem: Coping: Goal: Ability to adjust to condition or change in health will improve Outcome: Progressing   Problem: Fluid Volume: Goal: Ability to maintain a balanced intake and output will improve Outcome: Progressing   Problem: Metabolic: Goal: Ability to maintain appropriate glucose levels will improve Outcome: Progressing   Problem: Nutritional: Goal: Maintenance of adequate nutrition will improve Outcome: Progressing Goal: Progress toward achieving an optimal weight will improve Outcome: Progressing

## 2023-10-09 NOTE — Progress Notes (Signed)
   NAME:  Bobby [name], MRN:  [id number], DOB:  [birth year], LOS: 9 ADMISSION DATE:  09/30/2023, CONSULTATION DATE:  10/01/23 REFERRING MD: Redell Burnet, MD CHIEF COMPLAINT:  post op intubation  History of Present Illness:  Bobby [name] is a 78 year old male with numerous comorbidities (see below) who presented to the [facility] with complaints of RLQ pain, and nausea for one day. He was not vomiting.  CT in the ED showed acute appendicitis with appendicolith. Labs notable for WBC 14.5, potassium 5.5, Creatinine 4.48 (baseline ~2.5), lactic 1.4. He was started on zosyn . He was then admitted to medicine and surgery was consulted.    Surgery recommended lap appy but was concerned about his ability to withstand surgery given his multiple comorbidities. Cardiology was consulted and cleared him for surgery. He was also noted to have intermittent confusion after admission but before surgery.    1/28 he underwent laparoscopic appendectomy. Operative findings included necrotic appendix, a 52f blake drain was left in the pelvis. He was not extubated post op and was noted to be acidotic (ph 7.1) intra-op. CCM was consulted for post-op management.  Pertinent  Medical History  DM2, HTN, CVA, COPD on 4L [place] at baseline, CAD, STEMI 2013/2018 with PCI, HFrEF, CKD4, OSA not compliant with CPAP, A-V block with ICD. Current smoker.   Significant Hospital Events: Including procedures, antibiotic start and stop dates in addition to other pertinent events   1/27 admitted for acute appendicitis. Intermittent confusion. Started zosyn  1/28 lap appy, remained extubated post op 1/31 Overnight episodes of transient hypotension to 60s/40s lasting 10-20 seconds with repeated spontaneous recovery. 2/1 seen sitting up in bed in no acute distress, NG-tube removed, tolerated clear liquid diet  Interim History / Subjective:  Nausea resolved  Objective   Blood pressure 139/61, pulse 77, temperature 98.1 F (36.7 C), temperature source  Oral, resp. rate (!) 22, height 5' 9.02 (1.753 m), weight 118.5 kg, SpO2 94%.        Intake/Output Summary (Last 24 hours) at 10/09/2023 0929 Last data filed at 10/09/2023 9376 Gross per 24 hour  Intake 824 ml  Output 1045 ml  Net -221 ml   Filed Weights   10/06/23 0439 10/07/23 0408 10/08/23 0500  Weight: 119 kg 120 kg 118.5 kg    Examination: No distress Some oozing LLQ incision site dressed JP still some output Abdomen overall fairly benign and +BS Lungs diminished bases Globally remains weak  A Aspiration pneumonia COPD Current Smoker and OSA CAD w/ history of STEMI and PCI Essential HTN  HFrEF on chronic DOAC for LV thrombus prophylaxis- this occurred 7 years ago.  No urgent need to restart. Acute Appendicitis with peritonitis s/p lap appy 1/28 Post-op Ileus Resolved Septic Shock AKI superimposed on CKD IV w/ hyperkalemia and normal anion gap metabolic acidosis.  Diabetes Type 2 not on insulin    P - Encourage IS, up to chair, will need SNF - Diet advancement and drain per CCS, plan to remove drain prior to DC - Nebs to MDI's - GDMT as ordered - PT/OT help appreciated - Restart NoAC - Medically stable for discharge  Rolan Sharps MD PCCM

## 2023-10-09 NOTE — Progress Notes (Signed)
 Progress Note  8 Days Post-Op  Subjective: No further nausea or vomiting. Having bowel function. Some abdominal pain but overall seems well controlled. Having some weeping from LLQ incision.   Objective: Vital signs in last 24 hours: Temp:  [97.4 F (36.3 C)-98.7 F (37.1 C)] 98.1 F (36.7 C) (02/05 0804) Pulse Rate:  [73-81] 77 (02/05 0145) Resp:  [14-31] 22 (02/05 0145) BP: (117-144)/(60-96) 139/61 (02/05 0145) SpO2:  [92 %-97 %] 94 % (02/05 0145) Last BM Date : 10/08/23  Intake/Output from previous day: 02/04 0701 - 02/05 0700 In: 824 [P.O.:774; IV Piggyback:50] Out: 1095 [Urine:900; Drains:195] Intake/Output this shift: No intake/output data recorded.  PE: General: pleasant, WD, chronically ill appearing male who is laying in bed in NAD Heart: regular, rate, and rhythm. Lungs: Respiratory effort nonlabored on nasal cannula  Abd: soft, appropriately ttp, moderate distention, some serous weeping from LLQ drain, drain with serous fluid    Lab Results:  Recent Labs    10/08/23 0310  WBC 13.0*  HGB 12.9*  HCT 40.5  PLT 267   BMET Recent Labs    10/08/23 0310  NA 139  K 3.9  CL 105  CO2 21*  GLUCOSE 107*  BUN 103*  CREATININE 3.74*  CALCIUM  9.3   PT/INR No results for input(s): LABPROT, INR in the last 72 hours. CMP     Component Value Date/Time   NA 139 10/08/2023 0310   NA 140 01/11/2023 1332   K 3.9 10/08/2023 0310   CL 105 10/08/2023 0310   CO2 21 (L) 10/08/2023 0310   GLUCOSE 107 (H) 10/08/2023 0310   BUN 103 (H) 10/08/2023 0310   BUN 55 (H) 01/11/2023 1332   CREATININE 3.74 (H) 10/08/2023 0310   CREATININE 1.66 (H) 08/02/2016 1028   CALCIUM  9.3 10/08/2023 0310   PROT 6.4 (L) 10/01/2023 1700   PROT 6.5 01/11/2023 1332   ALBUMIN  3.0 (L) 10/01/2023 1700   ALBUMIN  4.4 01/11/2023 1332   AST 28 10/01/2023 1700   ALT 18 10/01/2023 1700   ALKPHOS 48 10/01/2023 1700   BILITOT 0.7 10/01/2023 1700   BILITOT 0.2 01/11/2023 1332   GFRNONAA  16 (L) 10/08/2023 0310   GFRAA 32 (L) 11/05/2019 1413   Lipase     Component Value Date/Time   LIPASE 29 09/30/2023 0220       Studies/Results: CT ABDOMEN PELVIS WO CONTRAST Result Date: 10/07/2023 CLINICAL DATA:  Abdominal pain postoperative EXAM: CT ABDOMEN AND PELVIS WITHOUT CONTRAST TECHNIQUE: Multidetector CT imaging of the abdomen and pelvis was performed following the standard protocol without IV contrast. RADIATION DOSE REDUCTION: This exam was performed according to the departmental dose-optimization program which includes automated exposure control, adjustment of the mA and/or kV according to patient size and/or use of iterative reconstruction technique. COMPARISON:  09/30/2023 FINDINGS: Lower chest: Small bilateral pleural effusions with basilar atelectasis. Mild cardiac enlargement. Hepatobiliary: No focal liver abnormality is seen. No gallstones, gallbladder wall thickening, or biliary dilatation. Pancreas: Unremarkable. No pancreatic ductal dilatation or surrounding inflammatory changes. Spleen: Normal in size without focal abnormality. Adrenals/Urinary Tract: No adrenal gland nodules. Bilateral renal cysts. Largest in the left lower pole measures 5.9 cm in diameter. No change since prior study. No imaging follow-up is indicated. 1.3 cm diameter hyperdense cyst in the left kidney is unchanged since prior study and likely represents a hemorrhagic cyst. No imaging follow-up is indicated. No hydronephrosis or hydroureter. No renal, ureteral, or bladder stones. Bladder is decompressed with a Foley catheter. Stomach/Bowel: Stomach  is filled with ingested material and contrast material. Proximal small bowel are mildly dilated with contrast fluid levels. Terminal ileum is decompressed. This may indicate early small-bowel obstruction or ileus. Scattered stool and liquid stool throughout the colon. No wall thickening or inflammatory changes. No colonic wall thickening. Interval resection of the  appendix with free fluid and stranding in the right lower quadrant and along the pericolic gutters. A surgical drain is in place. No loculated collections are identified. Vascular/Lymphatic: Aortic atherosclerosis. No enlarged abdominal or pelvic lymph nodes. Reproductive: Prostate is unremarkable. Penile prosthesis with right groin reservoir. Other: No free air in the abdomen. Abdominal wall musculature appears intact. Infiltration in the subcutaneous fat is likely edema or postoperative change. Musculoskeletal: Degenerative changes in the spine. No acute bony abnormalities. IMPRESSION: 1. Postoperative appendectomy with residual fluid and stranding in the right lower quadrant. Right lower quadrant drainage catheter in place. No loculated collections. 2. Mildly dilated fluid-filled small bowel with distal decompression may represent early obstruction or ileus. 3. Aortic atherosclerosis. 4. Small bilateral pleural effusions with basilar atelectasis. Electronically Signed   By: Elsie Gravely M.D.   On: 10/07/2023 17:52    Anti-infectives: Anti-infectives (From admission, onward)    Start     Dose/Rate Route Frequency Ordered Stop   10/08/23 1400  piperacillin -tazobactam (ZOSYN ) IVPB 2.25 g        2.25 g 100 mL/hr over 30 Minutes Intravenous Every 8 hours 10/08/23 0805 10/08/23 2158   10/02/23 1400  piperacillin -tazobactam (ZOSYN ) IVPB 2.25 g  Status:  Discontinued        2.25 g 100 mL/hr over 30 Minutes Intravenous Every 8 hours 10/02/23 0842 10/08/23 0805   09/30/23 1800  piperacillin -tazobactam (ZOSYN ) IVPB 3.375 g  Status:  Discontinued        3.375 g 12.5 mL/hr over 240 Minutes Intravenous Every 8 hours 09/30/23 1723 10/02/23 0842   09/30/23 0800  piperacillin -tazobactam (ZOSYN ) IVPB 3.375 g        3.375 g 100 mL/hr over 30 Minutes Intravenous  Once 09/30/23 0742 09/30/23 0959        Assessment/Plan Gangrenous appendicitis   POD8 s/p laparoscopic appendectomy   - having bowel  function, continue soft diet as tolerated - if further n/v then check KUB  - drain serous with 195 cc/24h - remove prior to DC - WBC 13K 2/4, trending down, afebrile - abx stopped 2/4 - CT 2/3 with residual fluid and stranding but no abscess or loculated fluid, mildly dilated small bowel  - mobilize as tolerated - surgically stable for DC to SNF   FEN: soft diet VTE: ok to resume DOAC ID: [email or web address]  to stop today   - per CCM -  AKI on CKD stage IV, improving Septic shock, resolved Aspiration PNA COPD HTN CAD s/p PCI HFrEF on DOAC for LV thrombus  T2DM  LOS: 9 days     Burnard JONELLE Louder, [facility] Surgery 10/09/2023, 8:40 AM Please see Amion for pager number during day hours 7:00am-4:30pm

## 2023-10-10 DIAGNOSIS — K35219 Acute appendicitis with generalized peritonitis, with abscess, unspecified as to perforation: Secondary | ICD-10-CM

## 2023-10-10 DIAGNOSIS — K353 Acute appendicitis with localized peritonitis, without perforation or gangrene: Principal | ICD-10-CM

## 2023-10-10 LAB — URINALYSIS, ROUTINE W REFLEX MICROSCOPIC
Bilirubin Urine: NEGATIVE
Glucose, UA: NEGATIVE mg/dL
Hgb urine dipstick: NEGATIVE
Ketones, ur: NEGATIVE mg/dL
Leukocytes,Ua: NEGATIVE
Nitrite: NEGATIVE
Protein, ur: NEGATIVE mg/dL
Specific Gravity, Urine: 1.02 (ref 1.005–1.030)
pH: 6 (ref 5.0–8.0)

## 2023-10-10 LAB — GLUCOSE, CAPILLARY
Glucose-Capillary: 121 mg/dL — ABNORMAL HIGH (ref 70–99)
Glucose-Capillary: 127 mg/dL — ABNORMAL HIGH (ref 70–99)
Glucose-Capillary: 136 mg/dL — ABNORMAL HIGH (ref 70–99)
Glucose-Capillary: 147 mg/dL — ABNORMAL HIGH (ref 70–99)
Glucose-Capillary: 163 mg/dL — ABNORMAL HIGH (ref 70–99)
Glucose-Capillary: 73 mg/dL (ref 70–99)
Glucose-Capillary: 85 mg/dL (ref 70–99)

## 2023-10-10 NOTE — TOC Progression Note (Signed)
 Transition of Care [facility]) - Progression Note    Patient Details  Name: Bobby [name] MRN: [id number] Date of Birth: 21-May-1946  Transition of [facility]) CM/SW Contact  Khristie Sak A Jekhi Bolin, LCSW Phone Number: 10/10/2023, 3:33 PM  Clinical Narrative:     CSW met with pt's daughter and pt at bedside, requested that pt have bed at [facility], TOC reached out, pt received bed offer.   CSW met with pt and bedside to update as well as called pt's daughter Corean and left vm regarding update on placement.   CSW to start insurance authorization once pt is near medical stability.    TOC will continue to follow.   Expected Discharge Plan: Skilled Nursing Facility Barriers to Discharge: Continued Medical Work up  Expected Discharge Plan and Services   Discharge Planning Services: CM Consult Post Acute Care Choice:  (Await treatment plan and PT/OT recommendations) Living arrangements for the past 2 months: Apartment                 DME Arranged: N/A DME Agency: NA       HH Arranged:  (Await treatment plan and PT/OT recommendations)           Social Determinants of Health (SDOH) Interventions SDOH Screenings   Food Insecurity: No Food Insecurity (09/30/2023)  Housing: Low Risk  (09/30/2023)  Transportation Needs: No Transportation Needs (09/30/2023)  Utilities: Not At Risk (09/30/2023)  Social Connections: Socially Isolated (09/30/2023)  Tobacco Use: High Risk (10/02/2023)   Received from [facility]    Readmission Risk Interventions    09/11/2021   12:13 PM  Readmission Risk Prevention Plan  Transportation Screening Complete  PCP or Specialist Appt within 3-5 Days Complete  HRI or Home Care Consult Complete  Social Work Consult for Recovery Care Planning/Counseling Complete  Palliative Care Screening Not Applicable  Medication Review [employer]) Complete

## 2023-10-10 NOTE — Progress Notes (Signed)
 Progress Note  9 Days Post-Op  Subjective: Serous drainage from LLQ incision ongoing but drain output decreasing, also serous. Tolerating diet and having bowel function.   Objective: Vital signs in last 24 hours: Temp:  [97.4 F (36.3 C)-98.3 F (36.8 C)] 97.4 F (36.3 C) (02/06 0815) Pulse Rate:  [73-85] 84 (02/06 0815) Resp:  [18-27] 18 (02/06 0815) BP: (114-145)/(37-63) 118/56 (02/06 0815) SpO2:  [95 %-97 %] 97 % (02/06 0815) Weight:  [119.6 kg] 119.6 kg (02/06 0443) Last BM Date : 10/09/23  Intake/Output from previous day: 02/05 0701 - 02/06 0700 In: 240 [P.O.:240] Out: 80 [Drains:80] Intake/Output this shift: No intake/output data recorded.  PE: General: pleasant, WD, chronically ill appearing male who is laying in bed in NAD Heart: regular, rate, and rhythm. Lungs: Respiratory effort nonlabored on nasal cannula  Abd: soft, appropriately ttp, moderate distention, some serous weeping from LLQ incisions, drain with serous fluid (removed without complication)   Lab Results:  Recent Labs    10/08/23 0310  WBC 13.0*  HGB 12.9*  HCT 40.5  PLT 267   BMET Recent Labs    10/08/23 0310  NA 139  K 3.9  CL 105  CO2 21*  GLUCOSE 107*  BUN 103*  CREATININE 3.74*  CALCIUM  9.3   PT/INR No results for input(s): LABPROT, INR in the last 72 hours. CMP     Component Value Date/Time   NA 139 10/08/2023 0310   NA 140 01/11/2023 1332   K 3.9 10/08/2023 0310   CL 105 10/08/2023 0310   CO2 21 (L) 10/08/2023 0310   GLUCOSE 107 (H) 10/08/2023 0310   BUN 103 (H) 10/08/2023 0310   BUN 55 (H) 01/11/2023 1332   CREATININE 3.74 (H) 10/08/2023 0310   CREATININE 1.66 (H) 08/02/2016 1028   CALCIUM  9.3 10/08/2023 0310   PROT 6.4 (L) 10/01/2023 1700   PROT 6.5 01/11/2023 1332   ALBUMIN  3.0 (L) 10/01/2023 1700   ALBUMIN  4.4 01/11/2023 1332   AST 28 10/01/2023 1700   ALT 18 10/01/2023 1700   ALKPHOS 48 10/01/2023 1700   BILITOT 0.7 10/01/2023 1700   BILITOT 0.2  01/11/2023 1332   GFRNONAA 16 (L) 10/08/2023 0310   GFRAA 32 (L) 11/05/2019 1413   Lipase     Component Value Date/Time   LIPASE 29 09/30/2023 0220       Studies/Results: No results found.   Anti-infectives: Anti-infectives (From admission, onward)    Start     Dose/Rate Route Frequency Ordered Stop   10/08/23 1400  piperacillin -tazobactam (ZOSYN ) IVPB 2.25 g        2.25 g 100 mL/hr over 30 Minutes Intravenous Every 8 hours 10/08/23 0805 10/08/23 2158   10/02/23 1400  piperacillin -tazobactam (ZOSYN ) IVPB 2.25 g  Status:  Discontinued        2.25 g 100 mL/hr over 30 Minutes Intravenous Every 8 hours 10/02/23 0842 10/08/23 0805   09/30/23 1800  piperacillin -tazobactam (ZOSYN ) IVPB 3.375 g  Status:  Discontinued        3.375 g 12.5 mL/hr over 240 Minutes Intravenous Every 8 hours 09/30/23 1723 10/02/23 0842   09/30/23 0800  piperacillin -tazobactam (ZOSYN ) IVPB 3.375 g        3.375 g 100 mL/hr over 30 Minutes Intravenous  Once 09/30/23 0742 09/30/23 0959        Assessment/Plan Gangrenous appendicitis   POD9 s/p laparoscopic appendectomy   - having bowel function, continue soft diet as tolerated - if further n/v then check KUB  -  drain removed at bedside - WBC 13K 2/4, trending down, afebrile - abx stopped 2/4 - CT 2/3 with residual fluid and stranding but no abscess or loculated fluid, mildly dilated small bowel  - mobilize as tolerated - surgically stable for DC to SNF   FEN: soft diet VTE: eliquis  ID: [email or web address]  1/27>2/4  - per CCM -  AKI on CKD stage IV, improving Septic shock, resolved Aspiration PNA COPD HTN CAD s/p PCI HFrEF on DOAC for LV thrombus  T2DM  LOS: 10 days     Burnard JONELLE Louder, [facility] Surgery 10/10/2023, 8:48 AM Please see Amion for pager number during day hours 7:00am-4:30pm

## 2023-10-10 NOTE — Progress Notes (Signed)
 Progress Note   Patient: Bobby [name] DOB: Aug 31, 1946 DOA: 09/30/2023     10 DOS: the patient was seen and examined on 10/10/2023   Brief hospital course:  Bobby [name] is a 78 year old male with numerous comorbidities (see below) who presented to the [facility] with complaints of RLQ pain, and nausea for one day. He was not vomiting.  CT in the ED showed acute appendicitis with appendicolith.    Surgery recommended lap appy but was concerned about his ability to withstand surgery given his multiple comorbidities. Cardiology was consulted and cleared him for surgery. He was also noted to have intermittent confusion after admission but before surgery.    1/28 he underwent laparoscopic appendectomy. Operative findings included necrotic appendix, a 8f blake drain was left in the pelvis. He was not extubated post op and was noted to be acidotic (ph 7.1) intra-op. CCM was consulted for post-op management.  1/27 admitted for acute appendicitis. Intermittent confusion. Started zosyn  1/28 lap appy, remained extubated post op 1/31 Overnight episodes of transient hypotension to 60s/40s lasting 10-20 seconds with repeated spontaneous recovery. 2/1 seen sitting up in bed in no acute distress, NG-tube removed, tolerated clear liquid diet 2/6 drain removed    Assessment and Plan:  Septic shock - Appears to be resolved at this time.  Peritonitis secondary to acute appendicitis - Status post laparoscopic appendectomy 1/28.  Followed closely by general surgery.  Tolerating advancement in diet.  Drain removed today at bedside.  Still having some serous drainage but otherwise improving.  Postop ileus - Appears to be resolved.  Patient tolerating advancement in diet.  Acute kidney injury on CKD 4 - Showing improvement.  Will recheck BMP in AM.  HFrEF/CAD/HTN/COPD - Does not appear to be in acute exacerbation.  Restart home medication regimen.  Diabetes mellitus - Continues on sliding  scale.  Physical debilitation muscle weakness - Evaluated by physical therapy.  Recommendations for SNF.  Working closely with family, case management on disposition planning.  Likely discharge tomorrow to SNF.     Subjective: Patient feeling well today.  Still having some serous drainage from his abdomen but otherwise improving clinically.  Tolerating advancement of diet.  Daughter at bedside, reinforcing decision for patient to pursue short-term rehab given his weakness.  Patient himself is hesitant but agrees with plan to get stronger.  Physical Exam: Vitals:   10/10/23 0408 10/10/23 0443 10/10/23 0815 10/10/23 1159  BP: (!) 145/63  (!) 118/56 118/60  Pulse: 85  84 76  Resp: 18  18 20   Temp: 97.6 F (36.4 C)  (!) 97.4 F (36.3 C) 97.6 F (36.4 C)  TempSrc: Oral  Oral Oral  SpO2: 97%  97%   Weight:  119.6 kg    Height:       GENERAL:  Alert, pleasant, disheveled HEENT:  EOMI, nasal cannula CARDIOVASCULAR:  RRR, no murmurs appreciated RESPIRATORY:  Clear to auscultation, no wheezing, rales, or rhonchi GASTROINTESTINAL:  Soft, normally tender, fresh bandages over surgical incision EXTREMITIES:  No LE edema bilaterally NEURO:  No new focal deficits appreciated SKIN:  No rashes noted PSYCH:  Appropriate mood and affect   Data Reviewed:  There are no new results to review at this time.  Family Communication: Daughter at bedside  Disposition: Status is: Inpatient Remains inpatient appropriate because: JP drain removed today.  Planned Discharge Destination: Skilled nursing facility    Time spent: 35 minutes   Author: Carliss LELON Canales, DO 10/10/2023 12:13 PM  For on call review [email or web address].

## 2023-10-10 NOTE — Progress Notes (Addendum)
 Mobility Specialist Progress Note:    10/10/23 0940  Mobility  Activity Transferred from bed to chair  Level of Assistance Moderate assist, patient does 50-74%  Assistive Device Other (Comment) (HHA)  Distance Ambulated (ft) 5 ft  Activity Response Tolerated well  Mobility Referral Yes  Mobility visit 1 Mobility  Mobility Specialist Start Time (ACUTE ONLY) 0925  Mobility Specialist Stop Time (ACUTE ONLY) 0940  Mobility Specialist Time Calculation (min) (ACUTE ONLY) 15 min   Pt received in bed agreeable to mobility pt was able to get to EOB independently, ModA to stand, and contact guard during transfer. No c/o throughout. Situated in chair w/ call bell and personal belongings in reach. All needs met. Daughter in room.  Thersia Minder Mobility Specialist  Please contact vis Secure Chat or  Rehab Office [phone]

## 2023-10-11 LAB — BASIC METABOLIC PANEL
Anion gap: 11 (ref 5–15)
BUN: 82 mg/dL — ABNORMAL HIGH (ref 8–23)
CO2: 20 mmol/L — ABNORMAL LOW (ref 22–32)
Calcium: 9.2 mg/dL (ref 8.9–10.3)
Chloride: 107 mmol/L (ref 98–111)
Creatinine, Ser: 3.33 mg/dL — ABNORMAL HIGH (ref 0.61–1.24)
GFR, Estimated: 18 mL/min — ABNORMAL LOW (ref >60)
Glucose, Bld: 102 mg/dL — ABNORMAL HIGH (ref 70–99)
Potassium: 4 mmol/L (ref 3.5–5.1)
Sodium: 138 mmol/L (ref 135–145)

## 2023-10-11 LAB — CBC
HCT: 40.3 % (ref 39.0–52.0)
Hemoglobin: 12.8 g/dL — ABNORMAL LOW (ref 13.0–17.0)
MCH: 28.4 pg (ref 26.0–34.0)
MCHC: 31.8 g/dL (ref 30.0–36.0)
MCV: 89.4 fL (ref 80.0–100.0)
Platelets: 385 10*3/uL (ref 150–400)
RBC: 4.51 MIL/uL (ref 4.22–5.81)
RDW: 14.4 % (ref 11.5–15.5)
WBC: 17.4 10*3/uL — ABNORMAL HIGH (ref 4.0–10.5)
nRBC: 0 % (ref 0.0–0.2)

## 2023-10-11 LAB — GLUCOSE, CAPILLARY
Glucose-Capillary: 96 mg/dL (ref 70–99)
Glucose-Capillary: 141 mg/dL — ABNORMAL HIGH (ref 70–99)
Glucose-Capillary: 128 mg/dL — ABNORMAL HIGH (ref 70–99)

## 2023-10-11 MED ORDER — PANTOPRAZOLE SODIUM 40 MG PO TBEC: 40.0000 mg | DELAYED_RELEASE_TABLET | Freq: Every day | ORAL | Status: DC

## 2023-10-11 MED ORDER — OXYCODONE-ACETAMINOPHEN 10-325 MG PO TABS: 1.0000 | ORAL_TABLET | Freq: Four times a day (QID) | ORAL | 0 refills | Status: AC | PRN

## 2023-10-11 MED ORDER — ENSURE ENLIVE PO LIQD: 237.0000 mL | Freq: Two times a day (BID) | ORAL | Status: DC

## 2023-10-11 MED ORDER — CLONAZEPAM 0.5 MG PO TABS: 0.5000 mg | ORAL_TABLET | Freq: Every evening | ORAL | 0 refills | Status: DC | PRN

## 2023-10-11 NOTE — Progress Notes (Signed)
 Progress Note  10 Days Post-Op  Subjective: Continues to have serous drainage from drain site and LLQ incision, nursing changing dressings PRN. Tolerating diet and having bowel function. AFVSS  Objective: Vital signs in last 24 hours: Temp:  [97.3 F (36.3 C)-98.5 F (36.9 C)] 98.5 F (36.9 C) (02/07 0421) Pulse Rate:  [76-88] 88 (02/07 0421) Resp:  [18-20] 18 (02/07 0421) BP: (118-146)/(60-73) 145/73 (02/07 0421) SpO2:  [94 %-97 %] 95 % (02/07 0836) Weight:  [119.7 kg] 119.7 kg (02/07 0500) Last BM Date : 10/09/23  Intake/Output from previous day: 02/06 0701 - 02/07 0700 In: 118 [P.O.:118] Out: 700 [Urine:700] Intake/Output this shift: No intake/output data recorded.  PE: General: pleasant, WD, chronically ill appearing male who is laying in bed in NAD Heart: regular, rate, and rhythm. Lungs: Respiratory effort nonlabored on nasal cannula  Abd: soft, appropriately ttp, mild distention, clean dressings over suprapubic and LLQ incisions    Lab Results:  Recent Labs    10/11/23 0652  WBC 17.4*  HGB 12.8*  HCT 40.3  PLT 385   BMET Recent Labs    10/11/23 0652  NA 138  K 4.0  CL 107  CO2 20*  GLUCOSE 102*  BUN 82*  CREATININE 3.33*  CALCIUM  9.2   PT/INR No results for input(s): LABPROT, INR in the last 72 hours. CMP     Component Value Date/Time   NA 138 10/11/2023 0652   NA 140 01/11/2023 1332   K 4.0 10/11/2023 0652   CL 107 10/11/2023 0652   CO2 20 (L) 10/11/2023 0652   GLUCOSE 102 (H) 10/11/2023 0652   BUN 82 (H) 10/11/2023 0652   BUN 55 (H) 01/11/2023 1332   CREATININE 3.33 (H) 10/11/2023 0652   CREATININE 1.66 (H) 08/02/2016 1028   CALCIUM  9.2 10/11/2023 0652   PROT 6.4 (L) 10/01/2023 1700   PROT 6.5 01/11/2023 1332   ALBUMIN  3.0 (L) 10/01/2023 1700   ALBUMIN  4.4 01/11/2023 1332   AST 28 10/01/2023 1700   ALT 18 10/01/2023 1700   ALKPHOS 48 10/01/2023 1700   BILITOT 0.7 10/01/2023 1700   BILITOT 0.2 01/11/2023 1332   GFRNONAA  18 (L) 10/11/2023 0652   GFRAA 32 (L) 11/05/2019 1413   Lipase     Component Value Date/Time   LIPASE 29 09/30/2023 0220       Studies/Results: No results found.   Anti-infectives: Anti-infectives (From admission, onward)    Start     Dose/Rate Route Frequency Ordered Stop   10/08/23 1400  piperacillin -tazobactam (ZOSYN ) IVPB 2.25 g        2.25 g 100 mL/hr over 30 Minutes Intravenous Every 8 hours 10/08/23 0805 10/08/23 2158   10/02/23 1400  piperacillin -tazobactam (ZOSYN ) IVPB 2.25 g  Status:  Discontinued        2.25 g 100 mL/hr over 30 Minutes Intravenous Every 8 hours 10/02/23 0842 10/08/23 0805   09/30/23 1800  piperacillin -tazobactam (ZOSYN ) IVPB 3.375 g  Status:  Discontinued        3.375 g 12.5 mL/hr over 240 Minutes Intravenous Every 8 hours 09/30/23 1723 10/02/23 0842   09/30/23 0800  piperacillin -tazobactam (ZOSYN ) IVPB 3.375 g        3.375 g 100 mL/hr over 30 Minutes Intravenous  Once 09/30/23 0742 09/30/23 0959        Assessment/Plan Gangrenous appendicitis   POD10 s/p laparoscopic appendectomy   - having bowel function, continue soft diet  - drain removed at bedside 2/6 - afebrile and VSS  -  mobilize as tolerated - surgically stable for DC to SNF, general surgery will sign off at this time but are available as needed. Follow up and instructions in AVS  FEN: soft diet VTE: eliquis  ID: [email or web address]  1/27>2/4  - per CCM -  AKI on CKD stage IV, improving Septic shock, resolved Aspiration PNA COPD HTN CAD s/p PCI HFrEF on DOAC for LV thrombus  T2DM  LOS: 11 days     Burnard JONELLE Louder, [facility] Surgery 10/11/2023, 8:40 AM Please see Amion for pager number during day hours 7:00am-4:30pm

## 2023-10-11 NOTE — Progress Notes (Signed)
 Nutrition Follow-up  DOCUMENTATION CODES:   Obesity unspecified  INTERVENTION:  Continue GI soft diet per Surgery Ensure Plus High Protein po BID, each supplement provides 350 kcal and 20 grams of protein. Magic cup TID with meals, each supplement provides 290 kcal and 9 grams of protein   NUTRITION DIAGNOSIS:   Inadequate oral intake related to inability to eat as evidenced by NPO status.    GOAL:   Patient will meet greater than or equal to 90% of their needs    MONITOR:   TF tolerance, I & O's, Vent status, Labs  REASON FOR ASSESSMENT:   Ventilator, Consult Enteral/tube feeding initiation and management  ASSESSMENT:   Pt with hx of HLD, HTN, COPD on 4L home O2, CKD IV, prior CVA, CHF, CAD s/p PCI, and DM type 2 presented to ED with abdominal pain. Found to have acute appendicitis and taken emergently to OR. Meal intake; 50-80% average intake of most meals Meds; COREG , NORVASC , RENA-VIT, LOVAZA ,  1/27 - presented to ED 1/28 - laparoscopic appendectomy, left intubated after surgery for metabolic acidemia 1/29 - cortrak placed 1/30 - cortrak removed, NGT placed to suction 1/31 - extubated; diet advanced to clear liquids 2/1 - NGT removed; diet advanced to full liquids 2/2 - diet advanced to GI soft  Hospital weight history:  10/11/23 0500 119.7 kg 263.89 lbs  10/10/23 0443 119.6 kg 263.67 lbs  10/08/23 0500 118.5 kg 261.25 lbs  10/07/23 0408 120 kg 264.55 lbs  10/06/23 0439 119 kg 262.35 lbs  10/05/23 0323 118.8 kg 261.91 lbs  10/04/23 0500 117 kg 257.94 lbs  10/03/23 0420 118.8 kg 261.91 lbs  10/02/23 0353 121 kg 266.76 lbs  10/01/23 1710 118.2 kg 260.58 lbs  10/01/23 1119 119.9 kg 264.33 lbs  09/30/23 11:27:47 119.9 kg 264.33 lb   NUTRITION - FOCUSED PHYSICAL EXAM:  Flowsheet Row Most Recent Value  Orbital Region No depletion  Upper Arm Region No depletion  Thoracic and Lumbar Region No depletion  Buccal Region No depletion  Temple Region No  depletion  Clavicle Bone Region No depletion  Clavicle and Acromion Bone Region No depletion  Scapular Bone Region No depletion  Dorsal Hand No depletion  Patellar Region No depletion  Anterior Thigh Region No depletion  Posterior Calf Region No depletion  Edema (RD Assessment) Mild  Hair Reviewed  Eyes Reviewed  Mouth Reviewed  Skin Reviewed  Nails Reviewed       Diet Order:   Diet Order             DIET SOFT Room service appropriate? Yes; Fluid consistency: Thin  Diet effective now                   EDUCATION NEEDS:   Education needs have been addressed  Skin:  Skin Assessment: Reviewed RN Assessment  Last BM:  2/4 type 4 small  Height:   Ht Readings from Last 1 Encounters:  10/01/23 5' 9.02 (1.753 m)    Weight:   Wt Readings from Last 1 Encounters:  10/11/23 119.7 kg    Ideal Body Weight:  72.7 kg  BMI:  Body mass index is 38.95 kg/m.  Estimated Nutritional Needs:   Kcal:  2000-2300 kcal/d  Protein:  110-125g/d  Fluid:  2L/d    Jenna Pew RDN, LDN Clinical Dietitian   If unable to reach, please contact RD Inpatient secure chat group between 8 am-4 pm daily

## 2023-10-11 NOTE — Discharge Summary (Addendum)
 Physician Discharge Summary   Patient: Bobby [name] MRN: [id number] DOB: 19-Sep-1945  Admit date:     09/30/2023  Discharge date: 10/11/23  Discharge Physician: Bobby [name]   PCP: Bobby Darice CROME, MD   Recommendations at discharge:   At this time patient will be discharged to SNF.  If you experience any symptoms such as fever, vomiting, shortness of breath, chest pain, abdominal pain, or other concerning symptoms, please call your primary care provider or go to the emergency department immediately. Discharge Diagnoses: Principal Problem:   Acute appendicitis with generalized peritonitis Active Problems:   COPD (chronic obstructive pulmonary disease) (HCC)   Tobacco abuse   History of stroke   CAD S/P percutaneous coronary angioplasty   Type 2 diabetes mellitus (HCC)   Chronic HFrEF (heart failure with reduced ejection fraction) (HCC)   CKD (chronic kidney disease), stage IV (HCC)   Chronic respiratory failure (HCC)   AKI (acute kidney injury) (HCC)   Acute appendicitis with localized peritonitis, without perforation, abscess, or gangrene  Resolved Problems:   * No resolved hospital problems. [facility] Course: Mr. Bobby [name] is a 78 year old male with numerous comorbidities (see below) who presented to the [facility] with complaints of RLQ pain, and nausea for one day. He was not vomiting.  CT in the ED showed acute appendicitis with appendicolith.    Surgery recommended lap appy but was concerned about his ability to withstand surgery given his multiple comorbidities. Cardiology was consulted and cleared him for surgery. He was also noted to have intermittent confusion after admission but before surgery.    1/28 he underwent laparoscopic appendectomy. Operative findings included necrotic appendix, a 6f blake drain was left in the pelvis. He was not extubated post op and was noted to be acidotic (ph 7.1) intra-op. CCM was consulted for post-op management.   1/27 admitted for acute  appendicitis. Intermittent confusion. Started zosyn  1/28 lap appy, remained extubated post op 1/31 Overnight episodes of transient hypotension to 60s/40s lasting 10-20 seconds with repeated spontaneous recovery. 2/1 seen sitting up in bed in no acute distress, NG-tube removed, tolerated clear liquid diet 2/6 drain removed     Assessment and Plan:   Septic shock - Appears to be resolved at this time.   Peritonitis secondary to acute appendicitis - Status post laparoscopic appendectomy 1/28.  Followed closely by general surgery.  Tolerating advancement in diet.  Drain removed today at bedside.  Appears resolved.  Postop ileus - Appears to be resolved.  Patient tolerating advancement in diet.  Continue soft diet.   Acute kidney injury on CKD 4 - Showing improvement.  No improvement throughout the course of hospital stay.  HFrEF/CAD/HTN/COPD - Does not appear to be in acute exacerbation.  Restart home medication regimen.   Diabetes mellitus - Resume home regimen.   Physical debilitation muscle weakness - Evaluated by physical therapy.  Recommendations for SNF.  Discharged to [facility] today     Pain control - [place]  Controlled Substance Reporting System database was reviewed. and patient was instructed, not to drive, operate heavy machinery, perform activities at heights, swimming or participation in water activities or provide baby-sitting services while on Pain, Sleep and Anxiety Medications; until their outpatient Physician has advised to do so again. Also recommended to not to take more than prescribed Pain, Sleep and Anxiety Medications.  Consultants: Surgery Procedures performed: Laparoscopic appendectomy Disposition: Skilled nursing facility Diet recommendation:  Discharge Diet Orders (From admission, onward)     Start  Ordered   10/11/23 0000  Diet - low sodium heart healthy        10/11/23 1121           Cardiac and Carb modified diet DISCHARGE  MEDICATION: Allergies as of 10/11/2023       Reactions   Hydrocodone Itching        Medication List     STOP taking these medications    empagliflozin  10 MG Tabs tablet Commonly known as: Jardiance    Lagevrio 200 MG Caps capsule Generic drug: molnupiravir EUA       TAKE these medications    acetaminophen  325 MG tablet Commonly known as: TYLENOL  Take 2 tablets (650 mg total) by mouth every 4 (four) hours as needed for headache or mild pain.   albuterol  108 (90 Base) MCG/ACT inhaler Commonly known as: VENTOLIN  HFA Inhale 2 puffs into the lungs every 6 (six) hours as needed for wheezing or shortness of breath.   amLODipine  5 MG tablet Commonly known as: NORVASC  TAKE ONE TABLET BY MOUTH DAILY   apixaban  5 MG Tabs tablet Commonly known as: Eliquis  Take 1 tablet (5 mg total) by mouth 2 (two) times daily.   aspirin  EC 81 MG tablet Take 1 tablet (81 mg total) by mouth daily. Swallow whole. What changed: when to take this   atorvastatin  80 MG tablet Commonly known as: LIPITOR  Take 1 tablet (80 mg total) by mouth daily at 6 PM.   benzonatate 100 MG capsule Commonly known as: TESSALON Take 100-200 mg by mouth 3 (three) times daily as needed for cough.   buPROPion  300 MG 24 hr tablet Commonly known as: WELLBUTRIN  XL Take 300 mg by mouth daily.   carvedilol  25 MG tablet Commonly known as: COREG  TAKE ONE TABLET BY MOUTH TWICE DAILY   clonazePAM  0.5 MG tablet Commonly known as: KLONOPIN  Take 1 tablet (0.5 mg total) by mouth at bedtime as needed (sleep). What changed:  medication strength how much to take when to take this reasons to take this additional instructions   Colcrys  0.6 MG tablet Generic drug: colchicine  Take 0.6 mg by mouth daily as needed (gout flare ups).   COQ10 PO Take 1 capsule by mouth daily.   dapagliflozin  propanediol 5 MG Tabs tablet Commonly known as: FARXIGA  Take 5 mg by mouth daily.   feeding supplement Liqd Take 237 mLs by  mouth 2 (two) times daily between meals.   FISH OIL PO Take 1 tablet by mouth daily.   furosemide  20 MG tablet Commonly known as: LASIX  Take 1 tablet (20 mg total) by mouth daily. What changed: Another medication with the same name was removed. Continue taking this medication, and follow the directions you see here.   hydrALAZINE  50 MG tablet Commonly known as: APRESOLINE  Take 1 tablet (50 mg total) by mouth every 8 (eight) hours.   isosorbide  mononitrate 30 MG 24 hr tablet Commonly known as: IMDUR  Take 1 tablet (30 mg total) by mouth daily.   multivitamin Tabs tablet Take 1 tablet by mouth daily.   nitroGLYCERIN  0.4 MG SL tablet Commonly known as: NITROSTAT  DISSOLVE 1 TABLET UNDER THE TONGUE EVERY 5 MINUTES AS NEEDED FOR CHEST PAIN. DO NOT EXCEED A TOTAL OF 3 DOSES IN 15 MINUTES.   oxyCODONE -acetaminophen  10-325 MG tablet Commonly known as: PERCOCET Take 1 tablet by mouth every 6 (six) hours as needed for pain.   pantoprazole  40 MG tablet Commonly known as: PROTONIX  Take 1 tablet (40 mg total) by mouth at bedtime.  predniSONE  10 MG tablet Commonly known as: DELTASONE  Take 10 mg by mouth daily.   sertraline  100 MG tablet Commonly known as: ZOLOFT  Take 100 mg by mouth daily.   Trelegy Ellipta 200-62.5-25 MCG/ACT Aepb Generic drug: Fluticasone -Umeclidin-Vilant Inhale 1 puff into the lungs daily.   VITAMIN C PO Take 1 tablet by mouth daily.        Contact information for follow-up providers     Maczis, Puja Gosai, PA-C. Go on 11/05/2023.   Specialty: General Surgery Why: 9:00 AM, please arrive 30  min prior to appointment time to check in. Contact information: [address] [facility] [place] [address] [phone]              Contact information for after-discharge care     Destination     [facility] [place] Preferred SNF .   Service: Skilled Nursing Contact information: [address] [place]  [phone]                    Discharge Exam: Fredricka Weights   10/08/23 0500 10/10/23 0443 10/11/23 0500  Weight: 118.5 kg 119.6 kg 119.7 kg   GENERAL:  Alert, pleasant, disheveled HEENT:  EOMI, nasal cannula CARDIOVASCULAR:  RRR, no murmurs appreciated RESPIRATORY:  Clear to auscultation, no wheezing, rales, or rhonchi GASTROINTESTINAL:  Soft, normally tender, fresh bandages over surgical incision EXTREMITIES:  No LE edema bilaterally NEURO:  No new focal deficits appreciated SKIN:  No rashes noted PSYCH:  Appropriate mood and affect   Condition at discharge: improving  The results of significant diagnostics from this hospitalization (including imaging, microbiology, ancillary and laboratory) are listed below for reference.   Imaging Studies: CT ABDOMEN PELVIS WO CONTRAST Result Date: 10/07/2023 CLINICAL DATA:  Abdominal pain postoperative EXAM: CT ABDOMEN AND PELVIS WITHOUT CONTRAST TECHNIQUE: Multidetector CT imaging of the abdomen and pelvis was performed following the standard protocol without IV contrast. RADIATION DOSE REDUCTION: This exam was performed according to the departmental dose-optimization program which includes automated exposure control, adjustment of the mA and/or kV according to patient size and/or use of iterative reconstruction technique. COMPARISON:  09/30/2023 FINDINGS: Lower chest: Small bilateral pleural effusions with basilar atelectasis. Mild cardiac enlargement. Hepatobiliary: No focal liver abnormality is seen. No gallstones, gallbladder wall thickening, or biliary dilatation. Pancreas: Unremarkable. No pancreatic ductal dilatation or surrounding inflammatory changes. Spleen: Normal in size without focal abnormality. Adrenals/Urinary Tract: No adrenal gland nodules. Bilateral renal cysts. Largest in the left lower pole measures 5.9 cm in diameter. No change since prior study. No imaging follow-up is  indicated. 1.3 cm diameter hyperdense cyst in the left kidney is unchanged since prior study and likely represents a hemorrhagic cyst. No imaging follow-up is indicated. No hydronephrosis or hydroureter. No renal, ureteral, or bladder stones. Bladder is decompressed with a Foley catheter. Stomach/Bowel: Stomach is filled with ingested material and contrast material. Proximal small bowel are mildly dilated with contrast fluid levels. Terminal ileum is decompressed. This may indicate early small-bowel obstruction or ileus. Scattered stool and liquid stool throughout the colon. No wall thickening or inflammatory changes. No colonic wall thickening. Interval resection of the appendix with free fluid and stranding in the right lower quadrant and along the pericolic gutters. A surgical drain is in place. No loculated collections are identified. Vascular/Lymphatic: Aortic atherosclerosis. No enlarged abdominal or pelvic lymph nodes. Reproductive: Prostate is unremarkable. Penile prosthesis with right groin reservoir. Other: No free air in the abdomen.  Abdominal wall musculature appears intact. Infiltration in the subcutaneous fat is likely edema or postoperative change. Musculoskeletal: Degenerative changes in the spine. No acute bony abnormalities. IMPRESSION: 1. Postoperative appendectomy with residual fluid and stranding in the right lower quadrant. Right lower quadrant drainage catheter in place. No loculated collections. 2. Mildly dilated fluid-filled small bowel with distal decompression may represent early obstruction or ileus. 3. Aortic atherosclerosis. 4. Small bilateral pleural effusions with basilar atelectasis. Electronically Signed   By: Elsie Gravely M.D.   On: 10/07/2023 17:52   DG Abd Portable 1V Result Date: 10/03/2023 CLINICAL DATA:  NG placement. EXAM: PORTABLE ABDOMEN - 1 VIEW COMPARISON:  Abdominal radiograph dated 10/02/2023. FINDINGS: Interval removal of the feeding tube and placement of an  enteric tube with tip over the epigastric area likely in the distal stomach. IMPRESSION: Enteric tube with tip over the distal stomach. Electronically Signed   By: Vanetta Chou M.D.   On: 10/03/2023 14:07   DG Abd Portable 1V Result Date: 10/02/2023 CLINICAL DATA:  Feeding tube placement. EXAM: PORTABLE ABDOMEN - 1 VIEW COMPARISON:  None Available. FINDINGS: Tip of the weighted enteric tube in the right upper quadrant in the region of the distal stomach. Few prominent air-filled loops of colon in the upper abdomen. IMPRESSION: Tip of the weighted enteric tube in the region of the distal stomach. Electronically Signed   By: Andrea Gasman M.D.   On: 10/02/2023 14:44   DG Chest Port 1 View Result Date: 10/02/2023 CLINICAL DATA:  Respiratory failure EXAM: PORTABLE CHEST 1 VIEW COMPARISON:  10/01/2023 FINDINGS: Endotracheal tube with tip at the clavicular heads. Biventricular pacer from the left in unremarkable position. Stable cardiomegaly. Small left pleural effusion and mild atelectasis. No pulmonary edema or air bronchogram. IMPRESSION: Stable including small left pleural effusion. Unremarkable endotracheal tube position. Electronically Signed   By: Dorn Roulette M.D.   On: 10/02/2023 08:53   DG Chest Port 1 View Result Date: 10/01/2023 CLINICAL DATA:  Respiratory failure EXAM: PORTABLE CHEST 1 VIEW COMPARISON:  03/12/2022 FINDINGS: Cardiac pacemaker. Interval placement of an endotracheal tube with tip measuring 5.2 cm above the carina. Shallow inspiration. Mild cardiac enlargement. No vascular congestion or edema. Small left pleural effusion with basilar atelectasis. No pneumothorax. Calcification of the aorta. IMPRESSION: Cardiac enlargement. Small left pleural effusion with basilar atelectasis. Endotracheal tube tip is above the carina. Electronically Signed   By: Elsie Gravely M.D.   On: 10/01/2023 19:56   CT ABDOMEN PELVIS WO CONTRAST Result Date: 09/30/2023 CLINICAL DATA:  78 year old  male with abdominal pain. EXAM: CT ABDOMEN AND PELVIS WITHOUT CONTRAST TECHNIQUE: Multidetector CT imaging of the abdomen and pelvis was performed following the standard protocol without IV contrast. RADIATION DOSE REDUCTION: This exam was performed according to the departmental dose-optimization program which includes automated exposure control, adjustment of the mA and/or kV according to patient size and/or use of iterative reconstruction technique. COMPARISON:  Chest CT 08/30/2023. FINDINGS: Lower chest: Stable cardiomegaly, partially visible cardiac pacemaker. No pericardial effusion. Patchy and confluent new bibasilar lung opacity most resembles atelectasis affecting the lingula and both costophrenic angles. No pleural effusion. Hepatobiliary: Negative noncontrast liver and gallbladder. Pancreas: Negative. Spleen: Negative. Adrenals/Urinary Tract: Bilateral adrenal gland thickening compatible with hyperplasia. Nonobstructed kidneys with multiple bilateral renal cysts, the largest have simple fluid density (no follow-up imaging recommended). No nephrolithiasis. Extensive but symmetric and nonspecific bilateral pararenal space fat stranding as seen on coronal image 60. Ureters are decompressed to the bladder. Decompressed and unremarkable urinary bladder.  Stomach/Bowel: Decompressed and unremarkable rectum. Thick-walled sigmoid colon at the right pelvic inlet along a segment of about 7 cm (series 3, image 70). Wall thickening continues proximally but is less pronounced. Superimposed diverticulosis in the more proximal sigmoid colon (series 3, image 75). Retained stool in the descending colon which has a more normal appearance. Retained gas and stool in the transverse colon which appears negative. Ascending colon is decompressed. Right lower quadrant inflammation at the tip of the cecum (series 3, image 71) is surrounding and dependent adjacent to abnormal appendix. Appendix: Location: Medial to the tip of the  cecum coronal image 77 Diameter: 14 mm Appendicolith: Positive (image 77) Mucosal hyper-enhancement: Noncontrast exam. Extraluminal gas: Negative. Periappendiceal collection: Regional inflammation and perhaps trace free fluid (series 3, image 68). Small bowel loops in the lower abdomen are widely irregular but decompressed. Retained fluid in the stomach. Decompressed duodenum. No free intraperitoneal air. No dilated small bowel. Small volume of free fluid in both gutters. Vascular/Lymphatic: Aortoiliac calcified atherosclerosis. Normal caliber abdominal aorta. Vascular patency is not evaluated in the absence of IV contrast. No lymphadenopathy identified. Reproductive: Penile implant. Other: No pelvis free fluid. Musculoskeletal: Advanced lower thoracic disc and endplate degeneration. No acute osseous abnormality identified. IMPRESSION: 1. Acute Appendicitis with appendicolith. No pneumoperitoneum to indicate perforation, and only a small volume of free fluid, but appearance of Widespread Peritonitis with extensive small bowel and multiple large bowel segments appearing actively inflamed. 2. No other acute finding; Lung base atelectasis. Nonspecific bilateral pararenal space inflammation without obstructive uropathy. Aortic Atherosclerosis (ICD10-I70.0). Electronically Signed   By: VEAR Hurst M.D.   On: 09/30/2023 05:58    Microbiology: Results for orders placed or performed during the hospital encounter of 09/30/23  Culture, blood (routine x 2)     Status: None   Collection Time: 09/30/23  9:05 AM   Specimen: BLOOD LEFT ARM  Result Value Ref Range Status   Specimen Description BLOOD LEFT ARM  Final   Special Requests   Final    BOTTLES DRAWN AEROBIC AND ANAEROBIC Blood Culture results may not be optimal due to an inadequate volume of blood received in culture bottles   Culture   Final    NO GROWTH 5 DAYS Performed at [facility], [address] [place], [place] [address]    Report Status  10/05/2023 FINAL  Final  Culture, blood (routine x 2)     Status: None   Collection Time: 09/30/23  9:50 AM   Specimen: BLOOD LEFT ARM  Result Value Ref Range Status   Specimen Description BLOOD LEFT ARM  Final   Special Requests   Final    BOTTLES DRAWN AEROBIC AND ANAEROBIC Blood Culture results may not be optimal due to an inadequate volume of blood received in culture bottles   Culture   Final    NO GROWTH 5 DAYS Performed at [facility], [address] [place], [place] [address]    Report Status 10/05/2023 FINAL  Final  Surgical PCR screen     Status: None   Collection Time: 10/01/23  5:43 AM   Specimen: Nasal Mucosa; Nasal Swab  Result Value Ref Range Status   MRSA, PCR NEGATIVE NEGATIVE Final   Staphylococcus aureus NEGATIVE NEGATIVE Final    Comment: (NOTE) The Xpert SA Assay (FDA approved for NASAL specimens in patients 59 years of age and older), is one component of a comprehensive surveillance program. It is not intended to diagnose infection nor to guide  or monitor treatment. Performed at [facility], [address] [place], [place] [address]     Labs: CBC: Recent Labs  Lab 10/05/23 1222 10/08/23 0310 10/11/23 0652  WBC 14.3* 13.0* 17.4*  HGB 13.5 12.9* 12.8*  HCT 42.3 40.5 40.3  MCV 90.0 89.4 89.4  PLT 179 267 385   Basic Metabolic Panel: Recent Labs  Lab 10/06/23 0401 10/08/23 0310 10/11/23 0652  NA 140 139 138  K 4.3 3.9 4.0  CL 112* 105 107  CO2 16* 21* 20*  GLUCOSE 108* 107* 102*  BUN 118* 103* 82*  CREATININE 3.88* 3.74* 3.33*  CALCIUM  9.1 9.3 9.2   Liver Function Tests: No results for input(s): AST, ALT, ALKPHOS, BILITOT, PROT, ALBUMIN  in the last 168 hours. CBG: Recent Labs  Lab 10/10/23 2031 10/10/23 2337 10/11/23 0423 10/11/23 0948 10/11/23 1144  GLUCAP 163* 121* 96 141* 128*    Discharge time spent: less than 30 minutes.  Signed: Carliss LELON Canales, DO [facility] 10/11/2023

## 2023-10-11 NOTE — Progress Notes (Signed)
 Physical Therapy Treatment Patient Details Name: Bobby [name] MRN: [id number] DOB: 01-Oct-1945 Today's Date: 10/11/2023   History of Present Illness Pt is a 78 y/o male admitted 1/28 with 1 day of intense abdominal pain found on arrival to be due to acute appendicitis, s/plaparoscopic appendectomy.  PMH HTN, CVA, CAD, STEMI 2013 and 2018 s/p PCI, HLD, HFrEF, CKD stage 4, OSA, COPD, high-degree AV block s/p CRT-D    PT Comments  [name] with questions about possible transport to SNF via private vehicle. PT assists pt's daughter in assisting in sit to stand transfer from low bed height in an effort to simulate transfer from low vehicle seat, pt continues to demonstrate improvement in transfer quality. Pt is able to march in place for a few steps and has demonstrated the ability to step and pivot from bed to chair, similarly from car to wheelchair. PT will continue to follow in an effort to improve mobility quality and to restore independence. Patient will benefit from continued inpatient follow up therapy, <3 hours/day.    If plan is discharge home, recommend the following: A lot of help with walking and/or transfers;A lot of help with bathing/dressing/bathroom;Assistance with cooking/housework;Assist for transportation;Help with stairs or ramp for entrance   Can travel by private vehicle     No  Equipment Recommendations  Wheelchair (measurements PT);Hoyer lift;Hospital bed    Recommendations for Other Services       Precautions / Restrictions Precautions Precautions: Fall Precaution Comments: JP drain abdomen Restrictions Weight Bearing Restrictions Per Provider Order: No     Mobility  Bed Mobility                    Transfers Overall transfer level: Needs assistance Equipment used: 2 person hand held assist Transfers: Sit to/from Stand Sit to Stand: Min assist, +2 physical assistance           General transfer comment: PT and pt's daughter assist in sit to  stand from bedside to simulate potential transfer from lower seat similar to pt's daughter's vehicle    Ambulation/Gait             Pre-gait activities: pt takes ~4 steps in place once standing with bilateral hand hold support     Stairs             Wheelchair Mobility     Tilt Bed    Modified Rankin (Stroke Patients Only)       Balance Overall balance assessment: Needs assistance Sitting-balance support: No upper extremity supported, Feet supported Sitting balance-Leahy Scale: Good     Standing balance support: Bilateral upper extremity supported, Reliant on assistive device for balance Standing balance-Leahy Scale: Poor Standing balance comment: minA                            Cognition Arousal: Alert Behavior During Therapy: WFL for tasks assessed/performed Overall Cognitive Status: Impaired/Different from baseline Area of Impairment: Orientation, Memory, Safety/judgement, Awareness, Problem solving                 Orientation Level: Disoriented to, Time Current Attention Level: Sustained Memory: Decreased recall of precautions, Decreased short-term memory   Safety/Judgement: Decreased awareness of safety, Decreased awareness of deficits Awareness: Emergent Problem Solving: Requires verbal cues          Exercises      General Comments General comments (skin integrity, edema, etc.): VSS on RA, PT assists pt's daughter  in lower body dressing. Condom cather removed after RN approval.      Pertinent Vitals/Pain Pain Assessment Pain Assessment: Faces Faces Pain Scale: Hurts even more Pain Location: abdomen Pain Descriptors / Indicators: Grimacing Pain Intervention(s): Monitored during session    Home Living Family/patient expects to be discharged to:: Skilled nursing facility Living Arrangements: Alone                      Prior Function            PT Goals (current goals can now be found in the care plan  section) Acute Rehab PT Goals Patient Stated Goal: return to prior level of function Progress towards PT goals: Progressing toward goals    Frequency    Min 1X/week      PT Plan      Co-evaluation              AM-PAC PT 6 Clicks Mobility   Outcome Measure  Help needed turning from your back to your side while in a flat bed without using bedrails?: A Lot Help needed moving from lying on your back to sitting on the side of a flat bed without using bedrails?: A Lot Help needed moving to and from a bed to a chair (including a wheelchair)?: A Lot Help needed standing up from a chair using your arms (e.g., wheelchair or bedside chair)?: A Lot Help needed to walk in hospital room?: A Lot Help needed climbing 3-5 steps with a railing? : Total 6 Click Score: 11    End of Session   Activity Tolerance: Patient limited by fatigue Patient left: in bed;with call bell/phone within reach;with family/visitor present Nurse Communication: Mobility status PT Visit Diagnosis: Other abnormalities of gait and mobility (R26.89)     Time: 8684-8674 PT Time Calculation (min) (ACUTE ONLY): 10 min  Charges:    $Therapeutic Activity: 8-22 mins PT General Charges $$ ACUTE PT VISIT: 1 Visit                     Bernardino JINNY Ruth, PT, DPT Acute Rehabilitation Office [phone]    Bernardino JINNY Ruth 10/11/2023, 2:04 PM

## 2023-10-11 NOTE — Plan of Care (Signed)
  Problem: Coping: Goal: Ability to adjust to condition or change in health will improve Outcome: Progressing   Problem: Fluid Volume: Goal: Ability to maintain a balanced intake and output will improve Outcome: Progressing   Problem: Metabolic: Goal: Ability to maintain appropriate glucose levels will improve Outcome: Progressing   Problem: Nutritional: Goal: Maintenance of adequate nutrition will improve Outcome: Progressing Goal: Progress toward achieving an optimal weight will improve Outcome: Progressing   Problem: Skin Integrity: Goal: Risk for impaired skin integrity will decrease Outcome: Progressing   Problem: Education: Goal: Knowledge of General Education information will improve Description: Including pain rating scale, medication(s)/side effects and non-pharmacologic comfort measures Outcome: Progressing   Problem: Health Behavior/Discharge Planning: Goal: Ability to manage health-related needs will improve Outcome: Progressing   Problem: Clinical Measurements: Goal: Ability to maintain clinical measurements within normal limits will improve Outcome: Progressing Goal: Will remain free from infection Outcome: Progressing Goal: Diagnostic test results will improve Outcome: Progressing Goal: Respiratory complications will improve Outcome: Progressing Goal: Cardiovascular complication will be avoided Outcome: Progressing   Problem: Activity: Goal: Risk for activity intolerance will decrease Outcome: Progressing   Problem: Nutrition: Goal: Adequate nutrition will be maintained Outcome: Progressing   Problem: Coping: Goal: Level of anxiety will decrease Outcome: Progressing   Problem: Elimination: Goal: Will not experience complications related to bowel motility Outcome: Progressing Goal: Will not experience complications related to urinary retention Outcome: Progressing   Problem: Pain Managment: Goal: General experience of comfort will improve  and/or be controlled Outcome: Progressing   Problem: Safety: Goal: Ability to remain free from injury will improve Outcome: Progressing   Problem: Skin Integrity: Goal: Risk for impaired skin integrity will decrease Outcome: Progressing

## 2023-10-11 NOTE — Progress Notes (Signed)
 Mobility Specialist: Progress Note   10/11/23 1144  Mobility  Activity Transferred from bed to chair  Level of Assistance +2 (takes two people)  Assistive Device Other (Comment) (HHA)  Activity Response Tolerated well  Mobility Referral Yes  Mobility visit 1 Mobility  Mobility Specialist Start Time (ACUTE ONLY) [id number]  Mobility Specialist Stop Time (ACUTE ONLY) 0854  Mobility Specialist Time Calculation (min) (ACUTE ONLY) 8 min    Transferred pt with RN to chair and back to bed. ModA+2 with HHA each time to stand pivot to chair/bed. Call bell in reach, RN present.   Ileana Lute Mobility Specialist Please contact via SecureChat or Rehab office at [phone]

## 2023-10-16 NOTE — Progress Notes (Signed)
Remote ICD transmission.

## 2023-10-18 ENCOUNTER — Other Ambulatory Visit: Payer: Self-pay | Admitting: Student

## 2023-10-18 DIAGNOSIS — Z9049 Acquired absence of other specified parts of digestive tract: Secondary | ICD-10-CM

## 2023-10-22 ENCOUNTER — Emergency Department (HOSPITAL_COMMUNITY): Payer: 59

## 2023-10-22 ENCOUNTER — Encounter (HOSPITAL_COMMUNITY): Payer: Self-pay | Admitting: Emergency Medicine

## 2023-10-22 ENCOUNTER — Inpatient Hospital Stay (HOSPITAL_COMMUNITY)
Admission: EM | Admit: 2023-10-22 | Discharge: 2023-10-25 | DRG: 862 | Disposition: A | Discharge status: Home Health | Payer: 59 | Attending: Internal Medicine | Admitting: Internal Medicine

## 2023-10-22 ENCOUNTER — Other Ambulatory Visit: Payer: Self-pay

## 2023-10-22 DIAGNOSIS — Z800 Family history of malignant neoplasm of digestive organs: Secondary | ICD-10-CM

## 2023-10-22 DIAGNOSIS — F17210 Nicotine dependence, cigarettes, uncomplicated: Secondary | ICD-10-CM | POA: Diagnosis present

## 2023-10-22 DIAGNOSIS — T8143XA Infection following a procedure, organ and space surgical site, initial encounter: Principal | ICD-10-CM | POA: Diagnosis present

## 2023-10-22 DIAGNOSIS — J9611 Chronic respiratory failure with hypoxia: Secondary | ICD-10-CM | POA: Diagnosis present

## 2023-10-22 DIAGNOSIS — K651 Peritoneal abscess: Secondary | ICD-10-CM | POA: Diagnosis not present

## 2023-10-22 DIAGNOSIS — Z9981 Dependence on supplemental oxygen: Secondary | ICD-10-CM

## 2023-10-22 DIAGNOSIS — E669 Obesity, unspecified: Secondary | ICD-10-CM | POA: Diagnosis present

## 2023-10-22 DIAGNOSIS — Z79899 Other long term (current) drug therapy: Secondary | ICD-10-CM

## 2023-10-22 DIAGNOSIS — I130 Hypertensive heart and chronic kidney disease with heart failure and stage 1 through stage 4 chronic kidney disease, or unspecified chronic kidney disease: Secondary | ICD-10-CM | POA: Diagnosis present

## 2023-10-22 DIAGNOSIS — Z955 Presence of coronary angioplasty implant and graft: Secondary | ICD-10-CM

## 2023-10-22 DIAGNOSIS — R339 Retention of urine, unspecified: Secondary | ICD-10-CM | POA: Diagnosis present

## 2023-10-22 DIAGNOSIS — I700 Atherosclerosis of aorta: Secondary | ICD-10-CM | POA: Diagnosis present

## 2023-10-22 DIAGNOSIS — Z95810 Presence of automatic (implantable) cardiac defibrillator: Secondary | ICD-10-CM

## 2023-10-22 DIAGNOSIS — Z841 Family history of disorders of kidney and ureter: Secondary | ICD-10-CM

## 2023-10-22 DIAGNOSIS — E785 Hyperlipidemia, unspecified: Secondary | ICD-10-CM | POA: Diagnosis present

## 2023-10-22 DIAGNOSIS — Z8673 Personal history of transient ischemic attack (TIA), and cerebral infarction without residual deficits: Secondary | ICD-10-CM

## 2023-10-22 DIAGNOSIS — I272 Pulmonary hypertension, unspecified: Secondary | ICD-10-CM | POA: Diagnosis present

## 2023-10-22 DIAGNOSIS — F32A Depression, unspecified: Secondary | ICD-10-CM | POA: Diagnosis present

## 2023-10-22 DIAGNOSIS — J449 Chronic obstructive pulmonary disease, unspecified: Secondary | ICD-10-CM | POA: Diagnosis present

## 2023-10-22 DIAGNOSIS — Z885 Allergy status to narcotic agent status: Secondary | ICD-10-CM

## 2023-10-22 DIAGNOSIS — F419 Anxiety disorder, unspecified: Secondary | ICD-10-CM | POA: Diagnosis present

## 2023-10-22 DIAGNOSIS — Z8249 Family history of ischemic heart disease and other diseases of the circulatory system: Secondary | ICD-10-CM

## 2023-10-22 DIAGNOSIS — E119 Type 2 diabetes mellitus without complications: Secondary | ICD-10-CM

## 2023-10-22 DIAGNOSIS — I5022 Chronic systolic (congestive) heart failure: Secondary | ICD-10-CM | POA: Diagnosis present

## 2023-10-22 DIAGNOSIS — Z9889 Other specified postprocedural states: Secondary | ICD-10-CM

## 2023-10-22 DIAGNOSIS — K56609 Unspecified intestinal obstruction, unspecified as to partial versus complete obstruction: Secondary | ICD-10-CM | POA: Diagnosis present

## 2023-10-22 DIAGNOSIS — N184 Chronic kidney disease, stage 4 (severe): Secondary | ICD-10-CM | POA: Diagnosis present

## 2023-10-22 DIAGNOSIS — Z7984 Long term (current) use of oral hypoglycemic drugs: Secondary | ICD-10-CM

## 2023-10-22 DIAGNOSIS — Z7901 Long term (current) use of anticoagulants: Secondary | ICD-10-CM

## 2023-10-22 DIAGNOSIS — I2510 Atherosclerotic heart disease of native coronary artery without angina pectoris: Secondary | ICD-10-CM | POA: Diagnosis present

## 2023-10-22 DIAGNOSIS — E1122 Type 2 diabetes mellitus with diabetic chronic kidney disease: Secondary | ICD-10-CM | POA: Diagnosis present

## 2023-10-22 DIAGNOSIS — Z7982 Long term (current) use of aspirin: Secondary | ICD-10-CM

## 2023-10-22 DIAGNOSIS — I252 Old myocardial infarction: Secondary | ICD-10-CM

## 2023-10-22 LAB — URINALYSIS, W/ REFLEX TO CULTURE (INFECTION SUSPECTED)
Bilirubin Urine: NEGATIVE
Glucose, UA: >=500 mg/dL — AB
Hgb urine dipstick: NEGATIVE
Ketones, ur: NEGATIVE mg/dL
Leukocytes,Ua: NEGATIVE
Nitrite: NEGATIVE
Protein, ur: NEGATIVE mg/dL
Specific Gravity, Urine: 1.01 (ref 1.005–1.030)
pH: 5 (ref 5.0–8.0)

## 2023-10-22 LAB — CBC WITH DIFFERENTIAL/PLATELET
Abs Immature Granulocytes: 0.22 10*3/uL — ABNORMAL HIGH (ref 0.00–0.07)
Basophils Absolute: 0.1 10*3/uL (ref 0.0–0.1)
Basophils Relative: 0 %
Eosinophils Absolute: 0.1 10*3/uL (ref 0.0–0.5)
Eosinophils Relative: 0 %
HCT: 39 % (ref 39.0–52.0)
Hemoglobin: 11.7 g/dL — ABNORMAL LOW (ref 13.0–17.0)
Immature Granulocytes: 2 %
Lymphocytes Relative: 8 %
Lymphs Abs: 1 10*3/uL (ref 0.7–4.0)
MCH: 28.1 pg (ref 26.0–34.0)
MCHC: 30 g/dL (ref 30.0–36.0)
MCV: 93.5 fL (ref 80.0–100.0)
Monocytes Absolute: 0.8 10*3/uL (ref 0.1–1.0)
Monocytes Relative: 6 %
Neutro Abs: 10.9 10*3/uL — ABNORMAL HIGH (ref 1.7–7.7)
Neutrophils Relative %: 84 %
Platelets: 298 10*3/uL (ref 150–400)
RBC: 4.17 MIL/uL — ABNORMAL LOW (ref 4.22–5.81)
RDW: 14.7 % (ref 11.5–15.5)
WBC: 13 10*3/uL — ABNORMAL HIGH (ref 4.0–10.5)
nRBC: 0 % (ref 0.0–0.2)

## 2023-10-22 LAB — COMPREHENSIVE METABOLIC PANEL
ALT: 29 U/L (ref 0–44)
AST: 28 U/L (ref 15–41)
Albumin: 2.3 g/dL — ABNORMAL LOW (ref 3.5–5.0)
Alkaline Phosphatase: 83 U/L (ref 38–126)
Anion gap: 10 (ref 5–15)
BUN: 35 mg/dL — ABNORMAL HIGH (ref 8–23)
CO2: 21 mmol/L — ABNORMAL LOW (ref 22–32)
Calcium: 9.5 mg/dL (ref 8.9–10.3)
Chloride: 107 mmol/L (ref 98–111)
Creatinine, Ser: 2.58 mg/dL — ABNORMAL HIGH (ref 0.61–1.24)
GFR, Estimated: 25 mL/min — ABNORMAL LOW (ref >60)
Glucose, Bld: 118 mg/dL — ABNORMAL HIGH (ref 70–99)
Potassium: 4.4 mmol/L (ref 3.5–5.1)
Sodium: 138 mmol/L (ref 135–145)
Total Bilirubin: 0.5 mg/dL (ref 0.0–1.2)
Total Protein: 5.9 g/dL — ABNORMAL LOW (ref 6.5–8.1)

## 2023-10-22 LAB — LIPASE, BLOOD: Lipase: 38 U/L (ref 11–51)

## 2023-10-22 MED ORDER — PIPERACILLIN-TAZOBACTAM 3.375 G IVPB
3.3750 g | Freq: Once | INTRAVENOUS | Status: AC
  Administered 2023-10-22: 3.375 g via INTRAVENOUS
  Filled 2023-10-22: qty 50

## 2023-10-22 NOTE — ED Notes (Signed)
Pt placed on 2L [place] per request. Wears at baseline intermittently at home but does not take oxygen with him when going out per sister.

## 2023-10-22 NOTE — ED Triage Notes (Signed)
Pt BIB by EMS from MD office. Reports recent surgery and pain at incision site in LLQ. Pt also endorses urinary retention since discharge from the hospital.  EMS VS: BP 122/78 HR 64 98% RA CBG 122

## 2023-10-22 NOTE — ED Notes (Signed)
Sister, Bobby [name], leaving at this time to go by pt house to get his medications to have at her house with her in case pt is discharged later. States she is unable to drive at night time due to eye problem. If pt is to be discharged from ED, pt is to be transported to Vicki's home so she is able to care for him during the upcoming inclement weather. She can be contacted at [phone].  [address] [place], [place] [address]
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
## 2023-10-23 ENCOUNTER — Other Ambulatory Visit: Payer: 59

## 2023-10-23 DIAGNOSIS — K651 Peritoneal abscess: Secondary | ICD-10-CM | POA: Diagnosis present

## 2023-10-23 DIAGNOSIS — T8143XA Infection following a procedure, organ and space surgical site, initial encounter: Secondary | ICD-10-CM

## 2023-10-23 DIAGNOSIS — Z79899 Other long term (current) drug therapy: Secondary | ICD-10-CM | POA: Diagnosis not present

## 2023-10-23 DIAGNOSIS — Z7984 Long term (current) use of oral hypoglycemic drugs: Secondary | ICD-10-CM | POA: Diagnosis not present

## 2023-10-23 DIAGNOSIS — Z955 Presence of coronary angioplasty implant and graft: Secondary | ICD-10-CM | POA: Diagnosis not present

## 2023-10-23 DIAGNOSIS — E1122 Type 2 diabetes mellitus with diabetic chronic kidney disease: Secondary | ICD-10-CM | POA: Diagnosis present

## 2023-10-23 DIAGNOSIS — N184 Chronic kidney disease, stage 4 (severe): Secondary | ICD-10-CM | POA: Diagnosis present

## 2023-10-23 DIAGNOSIS — Z8249 Family history of ischemic heart disease and other diseases of the circulatory system: Secondary | ICD-10-CM | POA: Diagnosis not present

## 2023-10-23 DIAGNOSIS — Z9981 Dependence on supplemental oxygen: Secondary | ICD-10-CM | POA: Diagnosis not present

## 2023-10-23 DIAGNOSIS — F17210 Nicotine dependence, cigarettes, uncomplicated: Secondary | ICD-10-CM | POA: Diagnosis present

## 2023-10-23 DIAGNOSIS — J9611 Chronic respiratory failure with hypoxia: Secondary | ICD-10-CM | POA: Diagnosis present

## 2023-10-23 DIAGNOSIS — Z7901 Long term (current) use of anticoagulants: Secondary | ICD-10-CM | POA: Diagnosis not present

## 2023-10-23 DIAGNOSIS — K56609 Unspecified intestinal obstruction, unspecified as to partial versus complete obstruction: Secondary | ICD-10-CM

## 2023-10-23 DIAGNOSIS — Z885 Allergy status to narcotic agent status: Secondary | ICD-10-CM | POA: Diagnosis not present

## 2023-10-23 DIAGNOSIS — I130 Hypertensive heart and chronic kidney disease with heart failure and stage 1 through stage 4 chronic kidney disease, or unspecified chronic kidney disease: Secondary | ICD-10-CM | POA: Diagnosis present

## 2023-10-23 DIAGNOSIS — Z95810 Presence of automatic (implantable) cardiac defibrillator: Secondary | ICD-10-CM | POA: Diagnosis not present

## 2023-10-23 DIAGNOSIS — J449 Chronic obstructive pulmonary disease, unspecified: Secondary | ICD-10-CM | POA: Diagnosis present

## 2023-10-23 DIAGNOSIS — I2510 Atherosclerotic heart disease of native coronary artery without angina pectoris: Secondary | ICD-10-CM | POA: Diagnosis present

## 2023-10-23 DIAGNOSIS — E785 Hyperlipidemia, unspecified: Secondary | ICD-10-CM | POA: Diagnosis present

## 2023-10-23 DIAGNOSIS — I272 Pulmonary hypertension, unspecified: Secondary | ICD-10-CM | POA: Diagnosis present

## 2023-10-23 DIAGNOSIS — F32A Depression, unspecified: Secondary | ICD-10-CM | POA: Diagnosis present

## 2023-10-23 DIAGNOSIS — E669 Obesity, unspecified: Secondary | ICD-10-CM | POA: Diagnosis present

## 2023-10-23 DIAGNOSIS — I700 Atherosclerosis of aorta: Secondary | ICD-10-CM | POA: Diagnosis present

## 2023-10-23 DIAGNOSIS — I5022 Chronic systolic (congestive) heart failure: Secondary | ICD-10-CM | POA: Diagnosis present

## 2023-10-23 LAB — BASIC METABOLIC PANEL
Anion gap: 10 (ref 5–15)
BUN: 36 mg/dL — ABNORMAL HIGH (ref 8–23)
CO2: 19 mmol/L — ABNORMAL LOW (ref 22–32)
Calcium: 9.4 mg/dL (ref 8.9–10.3)
Chloride: 109 mmol/L (ref 98–111)
Creatinine, Ser: 2.83 mg/dL — ABNORMAL HIGH (ref 0.61–1.24)
GFR, Estimated: 22 mL/min — ABNORMAL LOW (ref >60)
Glucose, Bld: 91 mg/dL (ref 70–99)
Potassium: 4.3 mmol/L (ref 3.5–5.1)
Sodium: 138 mmol/L (ref 135–145)

## 2023-10-23 LAB — CBG MONITORING, ED
Glucose-Capillary: 81 mg/dL (ref 70–99)
Glucose-Capillary: 92 mg/dL (ref 70–99)
Glucose-Capillary: 100 mg/dL — ABNORMAL HIGH (ref 70–99)
Glucose-Capillary: 88 mg/dL (ref 70–99)

## 2023-10-23 LAB — CBC
HCT: 37 % — ABNORMAL LOW (ref 39.0–52.0)
Hemoglobin: 11.5 g/dL — ABNORMAL LOW (ref 13.0–17.0)
MCH: 28.3 pg (ref 26.0–34.0)
MCHC: 31.1 g/dL (ref 30.0–36.0)
MCV: 91.1 fL (ref 80.0–100.0)
Platelets: 281 10*3/uL (ref 150–400)
RBC: 4.06 MIL/uL — ABNORMAL LOW (ref 4.22–5.81)
RDW: 14.4 % (ref 11.5–15.5)
WBC: 12.2 10*3/uL — ABNORMAL HIGH (ref 4.0–10.5)
nRBC: 0 % (ref 0.0–0.2)

## 2023-10-23 LAB — GLUCOSE, CAPILLARY
Glucose-Capillary: 92 mg/dL (ref 70–99)
Glucose-Capillary: 81 mg/dL (ref 70–99)
Glucose-Capillary: 74 mg/dL (ref 70–99)

## 2023-10-23 LAB — BRAIN NATRIURETIC PEPTIDE: B Natriuretic Peptide: 156.7 pg/mL — ABNORMAL HIGH (ref 0.0–100.0)

## 2023-10-23 MED ORDER — ISOSORBIDE MONONITRATE ER 30 MG PO TB24
30.0000 mg | ORAL_TABLET | Freq: Every day | ORAL | Status: DC
  Administered 2023-10-23 – 2023-10-24 (×2): 30 mg via ORAL
  Filled 2023-10-23 (×2): qty 1

## 2023-10-23 MED ORDER — AMLODIPINE BESYLATE 5 MG PO TABS
5.0000 mg | ORAL_TABLET | Freq: Every day | ORAL | Status: DC
  Administered 2023-10-23 – 2023-10-24 (×2): 5 mg via ORAL
  Filled 2023-10-23 (×2): qty 1

## 2023-10-23 MED ORDER — ALBUTEROL SULFATE (2.5 MG/3ML) 0.083% IN NEBU: 2.5000 mg | INHALATION_SOLUTION | Freq: Four times a day (QID) | RESPIRATORY_TRACT | Status: DC | PRN

## 2023-10-23 MED ORDER — ATORVASTATIN CALCIUM 80 MG PO TABS
80.0000 mg | ORAL_TABLET | Freq: Every day | ORAL | Status: DC
  Administered 2023-10-23 – 2023-10-24 (×2): 80 mg via ORAL
  Filled 2023-10-23 (×2): qty 1

## 2023-10-23 MED ORDER — CLONAZEPAM 0.5 MG PO TABS: 0.5000 mg | ORAL_TABLET | Freq: Every evening | ORAL | Status: DC | PRN

## 2023-10-23 MED ORDER — COLCHICINE 0.6 MG PO TABS
0.6000 mg | ORAL_TABLET | Freq: Every day | ORAL | Status: DC
  Administered 2023-10-24 – 2023-10-25 (×2): 0.6 mg via ORAL
  Filled 2023-10-23 (×3): qty 1

## 2023-10-23 MED ORDER — CARVEDILOL 25 MG PO TABS
25.0000 mg | ORAL_TABLET | Freq: Two times a day (BID) | ORAL | Status: DC
  Administered 2023-10-23 – 2023-10-25 (×5): 25 mg via ORAL
  Filled 2023-10-23: qty 2
  Filled 2023-10-23 (×4): qty 1

## 2023-10-23 MED ORDER — PANTOPRAZOLE SODIUM 40 MG PO TBEC
40.0000 mg | DELAYED_RELEASE_TABLET | Freq: Every day | ORAL | Status: DC
  Administered 2023-10-23 – 2023-10-24 (×2): 40 mg via ORAL
  Filled 2023-10-23: qty 2
  Filled 2023-10-23: qty 1

## 2023-10-23 MED ORDER — INSULIN ASPART 100 UNIT/ML IJ SOLN
0.0000 [IU] | INTRAMUSCULAR | Status: DC
  Administered 2023-10-24: 1 [IU] via SUBCUTANEOUS

## 2023-10-23 MED ORDER — FLUTICASONE FUROATE-VILANTEROL 200-25 MCG/ACT IN AEPB
1.0000 | INHALATION_SPRAY | Freq: Every day | RESPIRATORY_TRACT | Status: DC
  Administered 2023-10-23 – 2023-10-25 (×3): 1 via RESPIRATORY_TRACT
  Filled 2023-10-23: qty 28

## 2023-10-23 MED ORDER — ACETAMINOPHEN 650 MG RE SUPP: 650.0000 mg | Freq: Four times a day (QID) | RECTAL | Status: DC | PRN

## 2023-10-23 MED ORDER — BUPROPION HCL ER (XL) 150 MG PO TB24
300.0000 mg | ORAL_TABLET | Freq: Every day | ORAL | Status: DC
  Administered 2023-10-23 – 2023-10-25 (×3): 300 mg via ORAL
  Filled 2023-10-23 (×3): qty 2

## 2023-10-23 MED ORDER — FUROSEMIDE 40 MG PO TABS
40.0000 mg | ORAL_TABLET | Freq: Every day | ORAL | Status: DC
  Administered 2023-10-23: 40 mg via ORAL
  Filled 2023-10-23: qty 1

## 2023-10-23 MED ORDER — SERTRALINE HCL 100 MG PO TABS
100.0000 mg | ORAL_TABLET | Freq: Every day | ORAL | Status: DC
  Administered 2023-10-23 – 2023-10-25 (×3): 100 mg via ORAL
  Filled 2023-10-23 (×3): qty 1

## 2023-10-23 MED ORDER — PIPERACILLIN-TAZOBACTAM 3.375 G IVPB
3.3750 g | Freq: Three times a day (TID) | INTRAVENOUS | Status: DC
  Administered 2023-10-23 – 2023-10-25 (×7): 3.375 g via INTRAVENOUS
  Filled 2023-10-23 (×7): qty 50

## 2023-10-23 MED ORDER — HYDRALAZINE HCL 50 MG PO TABS
50.0000 mg | ORAL_TABLET | Freq: Three times a day (TID) | ORAL | Status: DC
  Administered 2023-10-23 – 2023-10-24 (×5): 50 mg via ORAL
  Filled 2023-10-23 (×3): qty 1
  Filled 2023-10-23 (×2): qty 2

## 2023-10-23 MED ORDER — UMECLIDINIUM BROMIDE 62.5 MCG/ACT IN AEPB
1.0000 | INHALATION_SPRAY | Freq: Every day | RESPIRATORY_TRACT | Status: DC
  Administered 2023-10-23 – 2023-10-25 (×3): 1 via RESPIRATORY_TRACT
  Filled 2023-10-23 (×2): qty 7

## 2023-10-23 MED ORDER — ACETAMINOPHEN 325 MG PO TABS: 650.0000 mg | ORAL_TABLET | Freq: Four times a day (QID) | ORAL | Status: DC | PRN

## 2023-10-23 NOTE — H&P (Signed)
History and Physical    Bobby [name]:[id number] DOB: 1946-04-20 DOA: 10/22/2023  PCP: Dois Davenport, MD  Patient coming from: Home  Chief Complaint: Surgical wound drainage  HPI: Bobby [name] is a 78 y.o. male with medical history significant of COPD, chronic hypoxemic respiratory failure on 2 L home oxygen, stroke, CAD status post PCI, anterior wall infarction with evidence of low flow state on echo at risk for LV thrombus on Eliquis, type 2 diabetes, chronic HFrEF status post CRT-D (EF 35-40% in 10/2022), CKD stage IV, hyperlipidemia, hypertension, anxiety/depression, GERD.  Patient was recently admitted to the hospital 1/27-2/7 for peritonitis secondary to acute appendicitis and septic shock.  Underwent laparoscopic appendectomy and Blake drain placement on 1/28.  Patient is presenting to the ED with persistent drainage from the site of his surgical drain which was removed on 2/6.  Vital signs stable, afebrile.  Labs showing WBC count 13.0, hemoglobin 11.7 (was 12.8 on 10/11/2023), creatinine 2.5 (improved).    CT abdomen pelvis showing: "IMPRESSION: 1. Interim removal of abdominal drainage catheter. Slightly thick rimmed fluid collection in the right gutter measuring up to 6.6 cm craniocaudad by 1.2 cm thick, probably representing organizing fluid collection/possible abscess. Interval development of low-density collection in the inferior right hepatic lobe measuring up to 3.6 cm, suspect for abscess as this was not present on the recent priors. 2. Increased fluid-filled small bowel dilatation in the lower abdomen and pelvis with decompressed small bowel distally, findings are suspect for bowel obstruction, possibly due to adhesive disease. 3. Aortic atherosclerosis.   Aortic Atherosclerosis (ICD10-I70.0)."  General surgery has seen the patient and no plans for urgent surgical intervention.  Recommended admission by medicine service for IV antibiotics.  Will discuss possible  IR consult tomorrow to evaluate for drainage, keep n.p.o. after midnight.  Patient was given Zosyn.  TRH called to admit.  Patient states he has continued to have drainage from his surgical site wound.  He is endorsing serous drainage from this area.  Denies fevers, chills, or abdominal pain.  Denies nausea or vomiting.  He is having bowel movements.  He has no other complaints.  Denies cough, shortness breath, or chest pain.  He is chronically on 2 L home oxygen at night and during the day as needed.   Of note, triage note mentions patient complaining of urinary retention.  Postvoid residual volume on bladder scan was 106 mL.  At present, patient denies any difficulty with urination.  UA not suggestive of infection.  Review of Systems:  Review of Systems  All other systems reviewed and are negative.   Past Medical History:  Diagnosis Date   DM (diabetes mellitus), type 2 new diagnosis 08/05/2012   diet control   ED (erectile dysfunction)    surgery planned   Hyperlipidemia 08/05/2012   Hypertensive heart disease    Panic attack    Pulmonary HTN (HCC)    echo 08/05/12, EF 55-60%, PA pressure 45mm   S/P coronary artery stent placement, to RCA Promus DES 08/05/2012   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   Tobacco abuse     Past Surgical History:  Procedure Laterality Date   BIV ICD INSERTION CRT-D N/A 03/11/2022   Procedure: BIV ICD INSERTION CRT-D;  Surgeon: Regan Lemming, MD;  Location: [facility] INVASIVE CV LAB;  Service: Cardiovascular;  Laterality: N/A;   CARDIAC CATHETERIZATION  08/04/12   PCI to RCA with DES   CORONARY STENT INTERVENTION N/A 08/12/2017  Procedure: CORONARY STENT INTERVENTION;  Surgeon: Swaziland, Peter M, MD;  Location: [facility] INVASIVE CV LAB;  Service: Cardiovascular;  Laterality: N/A;   CORONARY/GRAFT ACUTE MI REVASCULARIZATION N/A 08/09/2017   Procedure: Coronary/Graft Acute MI Revascularization;  Surgeon: Lyn Records, MD;  Location: [facility] INVASIVE CV LAB;   Service: Cardiovascular;  Laterality: N/A;   LAPAROSCOPIC APPENDECTOMY N/A 10/01/2023   Procedure: LAPAROSCOPIC APPENDECTOMY;  Surgeon: Lysle Rubens, MD;  Location: [facility] OR;  Service: General;  Laterality: N/A;   LEFT HEART CATH AND CORONARY ANGIOGRAPHY N/A 08/09/2017   Procedure: LEFT HEART CATH AND CORONARY ANGIOGRAPHY;  Surgeon: Lyn Records, MD;  Location: [facility] INVASIVE CV LAB;  Service: Cardiovascular;  Laterality: N/A;   LEFT HEART CATHETERIZATION WITH CORONARY ANGIOGRAM N/A 08/04/2012   Procedure: LEFT HEART CATHETERIZATION WITH CORONARY ANGIOGRAM;  Surgeon: Lennette Bihari, MD;  Location: [facility] CATH LAB;  Service: Cardiovascular;  Laterality: N/A;   LEG SURGERY Left    rod placed for fracture repair   PENILE PROSTHESIS IMPLANT N/A 06/03/2014   Procedure: IMPLANT PENILE PROTHESIS INFLATABLE;  Surgeon: Kathi Ludwig, MD;  Location: [facility] ORS;  Service: Urology;  Laterality: N/A;  with penile block--0.5% marcaine plain   PERCUTANEOUS CORONARY STENT INTERVENTION (PCI-S) Right 08/04/2012   Procedure: PERCUTANEOUS CORONARY STENT INTERVENTION (PCI-S);  Surgeon: Lennette Bihari, MD;  Location: [facility] CATH LAB;  Service: Cardiovascular;  Laterality: Right;   TEMPORARY PACEMAKER N/A 03/08/2022   Procedure: TEMPORARY PACEMAKER;  Surgeon: Lyn Records, MD;  Location: [facility] INVASIVE CV LAB;  Service: Cardiovascular;  Laterality: N/A;   THUMB ARTHROSCOPY Left    thumb joint replaced     reports that he has been smoking cigarettes. He has a 15 pack-year smoking history. He has never used smokeless tobacco. He reports that he does not currently use alcohol. He reports that he does not use drugs.  Allergies  Allergen Reactions   Hydrocodone Itching    Family History  Problem Relation Age of Onset   Kidney disease Father        kidney disease and heart disease   Heart disease Maternal Grandmother    Stomach cancer Maternal Grandfather    Heart attack Paternal Grandmother     Prior to Admission  medications   Medication Sig Start Date End Date Taking? Authorizing Provider  albuterol (PROVENTIL HFA;VENTOLIN HFA) 108 (90 Base) MCG/ACT inhaler Inhale 2 puffs into the lungs every 6 (six) hours as needed for wheezing or shortness of breath. 08/21/17  Yes Bhagat, Bhavinkumar, PA  amLODipine (NORVASC) 5 MG tablet TAKE ONE TABLET BY MOUTH DAILY 07/19/22  Yes Lennette Bihari, MD  apixaban (ELIQUIS) 5 MG TABS tablet Take 1 tablet (5 mg total) by mouth 2 (two) times daily. 03/15/22  Yes Graciella Freer, PA-C  aspirin EC 81 MG tablet Take 1 tablet (81 mg total) by mouth daily. Swallow whole. Patient taking differently: Take 81 mg by mouth at bedtime. Swallow whole. 03/15/22  Yes Graciella Freer, PA-C  atorvastatin (LIPITOR) 80 MG tablet Take 1 tablet (80 mg total) by mouth daily at 6 PM. 03/11/18  Yes Lennette Bihari, MD  buPROPion (WELLBUTRIN XL) 300 MG 24 hr tablet Take 300 mg by mouth daily.   Yes [provider]  carvedilol (COREG) 25 MG tablet TAKE ONE TABLET BY MOUTH TWICE DAILY 06/13/23  Yes Lennette Bihari, MD  clonazePAM (KLONOPIN) 0.5 MG tablet Take 1 tablet (0.5 mg total) by mouth at bedtime as needed (sleep). Patient taking differently: Take  1 mg by mouth at bedtime as needed (sleep). 10/11/23  Yes Deanna Artis, DO  Coenzyme Q10 (COQ10 PO) Take 1 capsule by mouth daily.   Yes [provider]  COLCRYS 0.6 MG tablet Take 0.6 mg by mouth daily. 03/21/17  Yes [provider]  dapagliflozin propanediol (FARXIGA) 5 MG TABS tablet Take 5 mg by mouth daily. 09/09/23  Yes [provider]  feeding supplement (ENSURE ENLIVE / ENSURE PLUS) LIQD Take 237 mLs by mouth 2 (two) times daily between meals. 10/11/23  Yes Deanna Artis, DO  furosemide (LASIX) 20 MG tablet Take 1 tablet (20 mg total) by mouth daily. Patient taking differently: Take 40 mg by mouth daily. 08/08/23  Yes Sabharwal, Aditya, DO  hydrALAZINE (APRESOLINE) 50 MG tablet Take 1 tablet (50 mg  total) by mouth every 8 (eight) hours. 03/12/22  Yes Graciella Freer, PA-C  isosorbide mononitrate (IMDUR) 30 MG 24 hr tablet Take 1 tablet (30 mg total) by mouth daily. 03/12/22  Yes Graciella Freer, PA-C  Omega-3 Fatty Acids (FISH OIL PO) Take 1 tablet by mouth daily.   Yes [provider]  oxyCODONE-acetaminophen (PERCOCET) 10-325 MG tablet Take 1 tablet by mouth every 6 (six) hours as needed for pain. 10/11/23  Yes Deanna Artis, DO  sertraline (ZOLOFT) 100 MG tablet Take 100 mg by mouth daily.   Yes [provider]  Dwyane Luo 200-62.5-25 MCG/ACT AEPB Inhale 1 puff into the lungs daily. 04/02/22  Yes [provider]  acetaminophen (TYLENOL) 325 MG tablet Take 2 tablets (650 mg total) by mouth every 4 (four) hours as needed for headache or mild pain. Patient not taking: Reported on 10/22/2023 03/12/22   Graciella Freer, PA-C  nitroGLYCERIN (NITROSTAT) 0.4 MG SL tablet DISSOLVE 1 TABLET UNDER THE TONGUE EVERY 5 MINUTES AS NEEDED FOR CHEST PAIN. DO NOT EXCEED A TOTAL OF 3 DOSES IN 15 MINUTES. Patient not taking: Reported on 10/22/2023 07/11/23   Lennette Bihari, MD  pantoprazole (PROTONIX) 40 MG tablet Take 1 tablet (40 mg total) by mouth at bedtime. Patient not taking: Reported on 10/22/2023 10/11/23   Deanna Artis, DO  predniSONE (DELTASONE) 10 MG tablet Take 10 mg by mouth daily. Patient not taking: Reported on 10/22/2023    [provider]    Physical Exam: Vitals:   10/22/23 1751 10/22/23 1830 10/22/23 2059 10/22/23 2303  BP:  (!) 136/90  127/60  Pulse:  73  81  Resp:  16  16  Temp:   97.8 F (36.6 C)   TempSrc:   Oral   SpO2: 98% 96%  100%    Physical Exam Vitals reviewed.  Constitutional:      General: He is not in acute distress. HENT:     Head: Normocephalic and atraumatic.  Eyes:     Extraocular Movements: Extraocular movements intact.  Cardiovascular:     Rate and Rhythm: Normal rate and regular rhythm.      Pulses: Normal pulses.  Pulmonary:     Effort: Pulmonary effort is normal. No respiratory distress.     Breath sounds: Normal breath sounds. No wheezing or rales.  Abdominal:     General: Bowel sounds are normal. There is no distension.     Palpations: Abdomen is soft.     Tenderness: There is no abdominal tenderness. There is no guarding.     Comments: LLQ surgical incision closed with minimal yellow/clear drainage  Musculoskeletal:     Cervical back:  Normal range of motion.     Comments: Mild bilateral pedal edema  Skin:    General: Skin is warm and dry.  Neurological:     General: No focal deficit present.     Mental Status: He is alert and oriented to person, place, and time.     Labs on Admission: I have personally reviewed following labs and imaging studies  CBC: Recent Labs  Lab 10/22/23 1730  WBC 13.0*  NEUTROABS 10.9*  HGB 11.7*  HCT 39.0  MCV 93.5  PLT 298   Basic Metabolic Panel: Recent Labs  Lab 10/22/23 1730  NA 138  K 4.4  CL 107  CO2 21*  GLUCOSE 118*  BUN 35*  CREATININE 2.58*  CALCIUM 9.5   GFR: Estimated Creatinine Clearance: 30.6 mL/min (A) (by C-G formula based on SCr of 2.58 mg/dL (H)). Liver Function Tests: Recent Labs  Lab 10/22/23 1730  AST 28  ALT 29  ALKPHOS 83  BILITOT 0.5  PROT 5.9*  ALBUMIN 2.3*   Recent Labs  Lab 10/22/23 1730  LIPASE 38   No results for input(s): "AMMONIA" in the last 168 hours. Coagulation Profile: No results for input(s): "INR", "PROTIME" in the last 168 hours. Cardiac Enzymes: No results for input(s): "CKTOTAL", "CKMB", "CKMBINDEX", "TROPONINI" in the last 168 hours. BNP (last 3 results) No results for input(s): "PROBNP" in the last 8760 hours. HbA1C: No results for input(s): "HGBA1C" in the last 72 hours. CBG: No results for input(s): "GLUCAP" in the last 168 hours. Lipid Profile: No results for input(s): "CHOL", "HDL", "LDLCALC", "TRIG", "CHOLHDL", "LDLDIRECT" in the last 72  hours. Thyroid Function Tests: No results for input(s): "TSH", "T4TOTAL", "FREET4", "T3FREE", "THYROIDAB" in the last 72 hours. Anemia Panel: No results for input(s): "VITAMINB12", "FOLATE", "FERRITIN", "TIBC", "IRON", "RETICCTPCT" in the last 72 hours. Urine analysis:    Component Value Date/Time   COLORURINE YELLOW 10/22/2023 1949   APPEARANCEUR CLEAR 10/22/2023 1949   LABSPEC 1.010 10/22/2023 1949   PHURINE 5.0 10/22/2023 1949   GLUCOSEU >=500 (A) 10/22/2023 1949   HGBUR NEGATIVE 10/22/2023 1949   BILIRUBINUR NEGATIVE 10/22/2023 1949   KETONESUR NEGATIVE 10/22/2023 1949   PROTEINUR NEGATIVE 10/22/2023 1949   NITRITE NEGATIVE 10/22/2023 1949   LEUKOCYTESUR NEGATIVE 10/22/2023 1949    Radiological Exams on Admission: CT ABDOMEN PELVIS WO CONTRAST Result Date: 10/22/2023 CLINICAL DATA:  Abdomen pain EXAM: CT ABDOMEN AND PELVIS WITHOUT CONTRAST TECHNIQUE: Multidetector CT imaging of the abdomen and pelvis was performed following the standard protocol without IV contrast. RADIATION DOSE REDUCTION: This exam was performed according to the departmental dose-optimization program which includes automated exposure control, adjustment of the mA and/or kV according to patient size and/or use of iterative reconstruction technique. COMPARISON:  CT 10/07/2023, 09/30/2023 FINDINGS: Lower chest: Lung bases demonstrate dependent atelectasis. Cardiac pacing leads. Hepatobiliary: Contracted gallbladder. No calcified stones. Interval development of low-density collection in the inferior right hepatic lobe measuring 3.6 x 3 cm on series 3, image 36. Pancreas: Unremarkable. No pancreatic ductal dilatation or surrounding inflammatory changes. Spleen: Normal in size without focal abnormality. Adrenals/Urinary Tract: Adrenal glands are normal. Kidneys show no hydronephrosis. Bilateral renal cysts and focal hyperdensity in the midpole left kidney for which no imaging follow-up is recommended. Bladder is unremarkable  Stomach/Bowel: Stomach nonenlarged. Patient is status post appendectomy. Increased fluid-filled small bowel dilatation in the lower abdomen and pelvis with bowel distended up to 4.4 cm. Decompressed small bowel distally, findings are suspect for bowel obstruction, possibly due to adhesive disease,  slightly tethered appearance of dilated and nondilated small bowel in the right lower quadrant on series 3, image 71. Vascular/Lymphatic: Aortic atherosclerosis. No enlarged abdominal or pelvic lymph nodes. Reproductive: Prostate is unremarkable.  Penile implant. Other: Negative for pelvic effusion. No free air. Interim removal of abdominal drainage catheter. Slightly thick rimmed fluid collection in the right gutter, measures about 6.6 cm craniocaudad by 1.2 cm thick on series 6, image 73 and series 3, image 52 and probably represents organizing fluid collection. Fluid and edema within the right greater than left flank regions without definite rim enhancement. Musculoskeletal: Degenerative changes. No acute osseous abnormality. IMPRESSION: 1. Interim removal of abdominal drainage catheter. Slightly thick rimmed fluid collection in the right gutter measuring up to 6.6 cm craniocaudad by 1.2 cm thick, probably representing organizing fluid collection/possible abscess. Interval development of low-density collection in the inferior right hepatic lobe measuring up to 3.6 cm, suspect for abscess as this was not present on the recent priors. 2. Increased fluid-filled small bowel dilatation in the lower abdomen and pelvis with decompressed small bowel distally, findings are suspect for bowel obstruction, possibly due to adhesive disease. 3. Aortic atherosclerosis. Aortic Atherosclerosis (ICD10-I70.0). Electronically Signed   By: Jasmine Pang M.D.   On: 10/22/2023 21:41    Assessment and Plan  Intra-abdominal abscesses following laparoscopic appendectomy Patient presenting with drainage from a left lower quadrant wound  following laparoscopic appendectomy.  CT concerning for intra abdominal abscesses.  WBC count 13.0.  No fever or signs of sepsis.  General surgery will discuss possible IR consult tomorrow to evaluate for drainage and no plans for urgent surgical intervention.  Continue Zosyn and keep n.p.o. after midnight.  Trend WBC count.  ?SBO CT with possible SBO secondary to adhesions.  Patient is having bowel movements.  No nausea, vomiting, or abdominal distention.  General surgery following.  COPD Chronic hypoxemic respiratory failure on 2 L home oxygen Stable, no signs of acute exacerbation.  No change in oxygen requirement from baseline.  Continue home inhalers.  CAD status post PCI Hold aspirin pending IR procedure in the morning.  Continue Coreg and Lipitor.  History of anterior wall infarction with evidence of low flow state on echo at risk for LV thrombus Patient is on Eliquis, hold pending IR evaluation in the morning.  May have to be started on IV heparin if procedure is delayed.  Non-insulin-dependent type 2 diabetes Last A1c 5.6 on 09/30/2023.  Placed on sensitive sliding scale insulin every 4 hours for now since patient is NPO.  Chronic HFrEF status post CRT-D (EF 35-40% in 10/2022) He has mild bilateral pedal edema but lungs clear on exam.  No change in oxygen requirement from baseline.  Check BNP.  CKD stage IV Creatinine improved, continue to monitor labs.  Hypertension Blood pressure stable.  Continue amlodipine, Coreg, hydralazine, and Imdur.  Anxiety/depression Continue home medications.  DVT prophylaxis: SCDs Code Status: Full Code (discussed with patient) Level of care: Telemetry bed Admission status: It is my clinical opinion that admission to INPATIENT is reasonable and necessary because of the expectation that this patient will require hospital care that crosses at least 2 midnights to treat this condition based on the medical complexity of the problems presented.  Given  the aforementioned information, the predictability of an adverse outcome is felt to be significant.  John Giovanni MD [facility]  If 7PM-7AM, please contact night-coverage [email or web address]  10/23/2023, 2:42 AM

## 2023-10-23 NOTE — Progress Notes (Signed)
Pharmacy Antibiotic Note  Bobby [name] is a 78 y.o. male admitted on 10/22/2023 with intra-abdominal infection. CT shows a collection in the right gutter as well as a collection within the liver, c/f possible abscesses.  No plans for surgical intervention at this time, possible IR for drain placement. Pharmacy has been consulted for zosyn dosing.  Plan: Zosyn 3.375g q8h  F/u renal function and length of therapy    Temp (24hrs), Avg:97.8 F (36.6 C), Min:97.7 F (36.5 C), Max:98 F (36.7 C)  Recent Labs  Lab 10/22/23 1730  WBC 13.0*  CREATININE 2.58*    Estimated Creatinine Clearance: 30.6 mL/min (A) (by C-G formula based on SCr of 2.58 mg/dL (H)).    Allergies  Allergen Reactions   Hydrocodone Itching    Antimicrobials this admission: Zosyn 2/18 >  Thank you for allowing pharmacy to be a part of this patient's care.  Marja Kays 10/23/2023 3:31 AM

## 2023-10-23 NOTE — Progress Notes (Signed)
No charge note. Patient was admitted earlier this AM. Pt was seen and examined.  Briefly, patient is a 78 year old male with history of COPD, chronic toxemic respiratory failure on baseline 2 L nasal cannula, prior CVA, CAD status post PCI, anterior wall infarct, history of low flow state on echo with risk for LV thrombus on Eliquis, diabetes, chronic systolic CHF with EF of 35 to 72%, CKD stage IV who was recently discharged on 10/11/2023 for peritonitis secondary to acute appendicitis undergoing laparoscopic appendectomy and drain placement.  Following outpatient drain removal, patient had increased drainage from surgical wound, prompting visit to the emergency department.  Emergency department, patient was noted to have a fluid collection on CT abdomen concerning for abdominal abscess.  Patient was noted to have a white blood count of 13,000.  General surgery was consulted.  Patient was continued on empiric Zosyn.  Vital signs presently stable with blood pressure well-controlled.  Patient was seen laying in bed, appeared comfortable, bandages overlying postop site with no active drainage through bandage  A/P Intra-abdominal abscess following laparoscopic appendectomy -General Surgery following -Attempted IR drainage today, however there was not enough fluid to drain. -Surgery has since advance diet with recommendations to continue empiric Zosyn -Continue analgesia as needed  COPD -No audible wheezing when seen this morning -On baseline 2 L nasal cannula  CAD status post PCI -Denies chest pains this morning, stable  History of anterior wall infarction with evidence of low flow state  -Eliquis on hold for now.  Patient was considered high risk for LV thrombus -Would resume anticoagulation when okay with general surgery  Type 2 diabetes mellitus -Glycemic trends appear stable -Continue sliding scale as needed  Chronic systolic congestive heart failure -Appears euvolemic this  morning -Creatinine appears near baseline.  Will resume home Lasix  CKD stage IV -Creatinine currently appears to be near baseline -Diet was resumed per cardiology, have resumed home Lasix -Continue to follow renal function trends  Hypertension -Blood pressure currently stable and well-controlled  Rickey Barbara, MD [facility]

## 2023-10-23 NOTE — Consult Note (Signed)
Bobby [name] 01/07/1946  [id number].    Requesting MD: Suezanne Jacquet Chief Complaint/Reason for Consult: IAA s/p lap appy  HPI:  78 y/o M w/ a hx of DM, HLD, HTN, CAD, pHTN, CKD, prior stroke, and COPD who presented to the ED with persistent drainage from a LLQ wound following a lap appy (1/28) for perforated appendicitis.  He had a LLQ surgical drain removed on 2/6 and has had persistent drainage from the site since then.  He was seen as an outpatient and scheduled to undergo a CT, however, when the drainage continued he decided to present for evaluation. He denies fevers and reports that he has been tolerating PO and having regular bowel function.  WBC 14. CT shows a collection in the right gutter as well as a collection within the liver, c/f possible abscesses.  On exam, patient is resting comfortably.  He denies pain. He is AF and HDS.   ROS: Review of Systems  Constitutional: Negative.   HENT: Negative.    Eyes: Negative.   Respiratory: Negative.    Cardiovascular: Negative.   Gastrointestinal: Negative.   Genitourinary: Negative.   Musculoskeletal: Negative.   Skin:        Drainage from LLQ   Neurological: Negative.   Endo/Heme/Allergies: Negative.   Psychiatric/Behavioral: Negative.      Family History  Problem Relation Age of Onset   Kidney disease Father        kidney disease and heart disease   Heart disease Maternal Grandmother    Stomach cancer Maternal Grandfather    Heart attack Paternal Grandmother     Past Medical History:  Diagnosis Date   DM (diabetes mellitus), type 2 new diagnosis 08/05/2012   diet control   ED (erectile dysfunction)    surgery planned   Hyperlipidemia 08/05/2012   Hypertensive heart disease    Panic attack    Pulmonary HTN (HCC)    echo 08/05/12, EF 55-60%, PA pressure 45mm   S/P coronary artery stent placement, to RCA Promus DES 08/05/2012   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   Tobacco abuse     Past Surgical  History:  Procedure Laterality Date   BIV ICD INSERTION CRT-D N/A 03/11/2022   Procedure: BIV ICD INSERTION CRT-D;  Surgeon: Regan Lemming, MD;  Location: [facility] INVASIVE CV LAB;  Service: Cardiovascular;  Laterality: N/A;   CARDIAC CATHETERIZATION  08/04/12   PCI to RCA with DES   CORONARY STENT INTERVENTION N/A 08/12/2017   Procedure: CORONARY STENT INTERVENTION;  Surgeon: Swaziland, Peter M, MD;  Location: [facility] INVASIVE CV LAB;  Service: Cardiovascular;  Laterality: N/A;   CORONARY/GRAFT ACUTE MI REVASCULARIZATION N/A 08/09/2017   Procedure: Coronary/Graft Acute MI Revascularization;  Surgeon: Lyn Records, MD;  Location: [facility] INVASIVE CV LAB;  Service: Cardiovascular;  Laterality: N/A;   LAPAROSCOPIC APPENDECTOMY N/A 10/01/2023   Procedure: LAPAROSCOPIC APPENDECTOMY;  Surgeon: Lysle Rubens, MD;  Location: [facility] OR;  Service: General;  Laterality: N/A;   LEFT HEART CATH AND CORONARY ANGIOGRAPHY N/A 08/09/2017   Procedure: LEFT HEART CATH AND CORONARY ANGIOGRAPHY;  Surgeon: Lyn Records, MD;  Location: [facility] INVASIVE CV LAB;  Service: Cardiovascular;  Laterality: N/A;   LEFT HEART CATHETERIZATION WITH CORONARY ANGIOGRAM N/A 08/04/2012   Procedure: LEFT HEART CATHETERIZATION WITH CORONARY ANGIOGRAM;  Surgeon: Lennette Bihari, MD;  Location: [facility] CATH LAB;  Service: Cardiovascular;  Laterality: N/A;   LEG SURGERY Left    rod placed for fracture repair  PENILE PROSTHESIS IMPLANT N/A 06/03/2014   Procedure: IMPLANT PENILE PROTHESIS INFLATABLE;  Surgeon: Kathi Ludwig, MD;  Location: [facility] ORS;  Service: Urology;  Laterality: N/A;  with penile block--0.5% marcaine plain   PERCUTANEOUS CORONARY STENT INTERVENTION (PCI-S) Right 08/04/2012   Procedure: PERCUTANEOUS CORONARY STENT INTERVENTION (PCI-S);  Surgeon: Lennette Bihari, MD;  Location: [facility] CATH LAB;  Service: Cardiovascular;  Laterality: Right;   TEMPORARY PACEMAKER N/A 03/08/2022   Procedure: TEMPORARY PACEMAKER;  Surgeon: Lyn Records, MD;  Location:  [facility] INVASIVE CV LAB;  Service: Cardiovascular;  Laterality: N/A;   THUMB ARTHROSCOPY Left    thumb joint replaced    Social History:  reports that he has been smoking cigarettes. He has a 15 pack-year smoking history. He has never used smokeless tobacco. He reports that he does not currently use alcohol. He reports that he does not use drugs.  Allergies:  Allergies  Allergen Reactions   Hydrocodone Itching    (Not in a hospital admission)   Physical Exam: Blood pressure 127/60, pulse 81, temperature 97.8 F (36.6 C), temperature source Oral, resp. rate 16, SpO2 100%. Gen: male resting, NAD Abd: soft, non-distended, non-tender, LLQ incision closed but with minimal clear/yellow drainage, no fluctuance, no surrounding erythema  Results for orders placed or performed during the hospital encounter of 10/22/23 (from the past 48 hours)  Comprehensive metabolic panel     Status: Abnormal   Collection Time: 10/22/23  5:30 PM  Result Value Ref Range   Sodium 138 135 - 145 mmol/L   Potassium 4.4 3.5 - 5.1 mmol/L   Chloride 107 98 - 111 mmol/L   CO2 21 (L) 22 - 32 mmol/L   Glucose, Bld 118 (H) 70 - 99 mg/dL    Comment: Glucose reference range applies only to samples taken after fasting for at least 8 hours.   BUN 35 (H) 8 - 23 mg/dL   Creatinine, Ser 9.14 (H) 0.61 - 1.24 mg/dL   Calcium 9.5 8.9 - 78.2 mg/dL   Total Protein 5.9 (L) 6.5 - 8.1 g/dL   Albumin 2.3 (L) 3.5 - 5.0 g/dL   AST 28 15 - 41 U/L   ALT 29 0 - 44 U/L   Alkaline Phosphatase 83 38 - 126 U/L   Total Bilirubin 0.5 0.0 - 1.2 mg/dL   GFR, Estimated 25 (L) >60 mL/min    Comment: (NOTE) Calculated using the CKD-EPI Creatinine Equation (2021)    Anion gap 10 5 - 15    Comment: Performed at [facility] Lab, [address] [place], [place] [address]  CBC with Differential     Status: Abnormal   Collection Time: 10/22/23  5:30 PM  Result Value Ref Range   WBC 13.0 (H) 4.0 - 10.5 K/uL   RBC 4.17 (L) 4.22 - 5.81 MIL/uL    Hemoglobin 11.7 (L) 13.0 - 17.0 g/dL   HCT 30.8 65.7 - 84.6 %   MCV 93.5 80.0 - 100.0 fL   MCH 28.1 26.0 - 34.0 pg   MCHC 30.0 30.0 - 36.0 g/dL   RDW 96.2 95.2 - 84.1 %   Platelets 298 150 - 400 K/uL   nRBC 0.0 0.0 - 0.2 %   Neutrophils Relative % 84 %   Neutro Abs 10.9 (H) 1.7 - 7.7 K/uL   Lymphocytes Relative 8 %   Lymphs Abs 1.0 0.7 - 4.0 K/uL   Monocytes Relative 6 %   Monocytes Absolute 0.8 0.1 - 1.0 K/uL   Eosinophils  Relative 0 %   Eosinophils Absolute 0.1 0.0 - 0.5 K/uL   Basophils Relative 0 %   Basophils Absolute 0.1 0.0 - 0.1 K/uL   Immature Granulocytes 2 %   Abs Immature Granulocytes 0.22 (H) 0.00 - 0.07 K/uL    Comment: Performed at [facility], [address], [place], [place] [address]  Lipase, blood     Status: None   Collection Time: 10/22/23  5:30 PM  Result Value Ref Range   Lipase 38 11 - 51 U/L    Comment: Performed at [facility], [address], [place], [place] [address]  Urinalysis, w/ Reflex to Culture (Infection Suspected) -Urine, Clean Catch     Status: Abnormal   Collection Time: 10/22/23  7:49 PM  Result Value Ref Range   Specimen Source URINE, CLEAN CATCH    Color, Urine YELLOW YELLOW   APPearance CLEAR CLEAR   Specific Gravity, Urine 1.010 1.005 - 1.030   pH 5.0 5.0 - 8.0   Glucose, UA >=500 (A) NEGATIVE mg/dL   Hgb urine dipstick NEGATIVE NEGATIVE   Bilirubin Urine NEGATIVE NEGATIVE   Ketones, ur NEGATIVE NEGATIVE mg/dL   Protein, ur NEGATIVE NEGATIVE mg/dL   Nitrite NEGATIVE NEGATIVE   Leukocytes,Ua NEGATIVE NEGATIVE   RBC / HPF 0-5 0 - 5 RBC/hpf   WBC, UA 0-5 0 - 5 WBC/hpf    Comment:        Reflex urine culture not performed if WBC <=10, OR if Squamous epithelial cells >5. If Squamous epithelial cells >5 suggest recollection.    Bacteria, UA RARE (A) NONE SEEN   Squamous Epithelial / HPF 0-5 0 - 5 /HPF   Mucus PRESENT     Comment: Performed at [facility], [address], [place], [place] [address]   CT  ABDOMEN PELVIS WO CONTRAST Result Date: 10/22/2023 CLINICAL DATA:  Abdomen pain EXAM: CT ABDOMEN AND PELVIS WITHOUT CONTRAST TECHNIQUE: Multidetector CT imaging of the abdomen and pelvis was performed following the standard protocol without IV contrast. RADIATION DOSE REDUCTION: This exam was performed according to the departmental dose-optimization program which includes automated exposure control, adjustment of the mA and/or kV according to patient size and/or use of iterative reconstruction technique. COMPARISON:  CT 10/07/2023, 09/30/2023 FINDINGS: Lower chest: Lung bases demonstrate dependent atelectasis. Cardiac pacing leads. Hepatobiliary: Contracted gallbladder. No calcified stones. Interval development of low-density collection in the inferior right hepatic lobe measuring 3.6 x 3 cm on series 3, image 36. Pancreas: Unremarkable. No pancreatic ductal dilatation or surrounding inflammatory changes. Spleen: Normal in size without focal abnormality. Adrenals/Urinary Tract: Adrenal glands are normal. Kidneys show no hydronephrosis. Bilateral renal cysts and focal hyperdensity in the midpole left kidney for which no imaging follow-up is recommended. Bladder is unremarkable Stomach/Bowel: Stomach nonenlarged. Patient is status post appendectomy. Increased fluid-filled small bowel dilatation in the lower abdomen and pelvis with bowel distended up to 4.4 cm. Decompressed small bowel distally, findings are suspect for bowel obstruction, possibly due to adhesive disease, slightly tethered appearance of dilated and nondilated small bowel in the right lower quadrant on series 3, image 71. Vascular/Lymphatic: Aortic atherosclerosis. No enlarged abdominal or pelvic lymph nodes. Reproductive: Prostate is unremarkable.  Penile implant. Other: Negative for pelvic effusion. No free air. Interim removal of abdominal drainage catheter. Slightly thick rimmed fluid collection in the right gutter, measures about 6.6 cm  craniocaudad by 1.2 cm thick on series 6, image 73 and series 3, image 52 and probably represents  organizing fluid collection. Fluid and edema within the right greater than left flank regions without definite rim enhancement. Musculoskeletal: Degenerative changes. No acute osseous abnormality. IMPRESSION: 1. Interim removal of abdominal drainage catheter. Slightly thick rimmed fluid collection in the right gutter measuring up to 6.6 cm craniocaudad by 1.2 cm thick, probably representing organizing fluid collection/possible abscess. Interval development of low-density collection in the inferior right hepatic lobe measuring up to 3.6 cm, suspect for abscess as this was not present on the recent priors. 2. Increased fluid-filled small bowel dilatation in the lower abdomen and pelvis with decompressed small bowel distally, findings are suspect for bowel obstruction, possibly due to adhesive disease. 3. Aortic atherosclerosis. Aortic Atherosclerosis (ICD10-I70.0). Electronically Signed   By: Jasmine Pang M.D.   On: 10/22/2023 21:41    Assessment/Plan 78 y/o M w/ a hx of DM, HLD, HTN, CAD, pHTN, CKD, prior stroke, and COPD who presents with drainage from a LLQ wound following lap appy and has a CT c/f IAA  - No indication for urgent surgical intervention - Admission to medicine  - IV abx - Will discuss possible IR consult tomorrow to evaluate for drainage - NPO at MN - Surgery will continue to follow  I reviewed last 24 h vitals and pain scores, last 24 h labs and trends, and last 24 h imaging results.  Tacy [facility] 10/23/2023, 12:37 AM Please see Amion for pager number during day hours 7:00am-4:30pm or 7:00am -11:30am on weekends

## 2023-10-24 DIAGNOSIS — K651 Peritoneal abscess: Secondary | ICD-10-CM | POA: Diagnosis not present

## 2023-10-24 LAB — CBC
HCT: 38.4 % — ABNORMAL LOW (ref 39.0–52.0)
Hemoglobin: 11.7 g/dL — ABNORMAL LOW (ref 13.0–17.0)
MCH: 28.3 pg (ref 26.0–34.0)
MCHC: 30.5 g/dL (ref 30.0–36.0)
MCV: 92.8 fL (ref 80.0–100.0)
Platelets: 286 10*3/uL (ref 150–400)
RBC: 4.14 MIL/uL — ABNORMAL LOW (ref 4.22–5.81)
RDW: 14.7 % (ref 11.5–15.5)
WBC: 11.9 10*3/uL — ABNORMAL HIGH (ref 4.0–10.5)
nRBC: 0 % (ref 0.0–0.2)

## 2023-10-24 LAB — GLUCOSE, CAPILLARY
Glucose-Capillary: 73 mg/dL (ref 70–99)
Glucose-Capillary: 85 mg/dL (ref 70–99)
Glucose-Capillary: 99 mg/dL (ref 70–99)
Glucose-Capillary: 129 mg/dL — ABNORMAL HIGH (ref 70–99)
Glucose-Capillary: 138 mg/dL — ABNORMAL HIGH (ref 70–99)
Glucose-Capillary: 110 mg/dL — ABNORMAL HIGH (ref 70–99)

## 2023-10-24 LAB — COMPREHENSIVE METABOLIC PANEL
ALT: 25 U/L (ref 0–44)
AST: 26 U/L (ref 15–41)
Albumin: 2.1 g/dL — ABNORMAL LOW (ref 3.5–5.0)
Alkaline Phosphatase: 75 U/L (ref 38–126)
Anion gap: 14 (ref 5–15)
BUN: 36 mg/dL — ABNORMAL HIGH (ref 8–23)
CO2: 19 mmol/L — ABNORMAL LOW (ref 22–32)
Calcium: 9.7 mg/dL (ref 8.9–10.3)
Chloride: 110 mmol/L (ref 98–111)
Creatinine, Ser: 2.92 mg/dL — ABNORMAL HIGH (ref 0.61–1.24)
GFR, Estimated: 21 mL/min — ABNORMAL LOW (ref >60)
Glucose, Bld: 94 mg/dL (ref 70–99)
Potassium: 4.7 mmol/L (ref 3.5–5.1)
Sodium: 143 mmol/L (ref 135–145)
Total Bilirubin: 0.5 mg/dL (ref 0.0–1.2)
Total Protein: 5.8 g/dL — ABNORMAL LOW (ref 6.5–8.1)

## 2023-10-24 MED ORDER — APIXABAN 5 MG PO TABS
5.0000 mg | ORAL_TABLET | Freq: Two times a day (BID) | ORAL | Status: DC
  Administered 2023-10-24 – 2023-10-25 (×2): 5 mg via ORAL
  Filled 2023-10-24 (×3): qty 1

## 2023-10-24 MED ORDER — HYDRALAZINE HCL 25 MG PO TABS
25.0000 mg | ORAL_TABLET | Freq: Three times a day (TID) | ORAL | Status: DC
  Administered 2023-10-25: 25 mg via ORAL
  Filled 2023-10-24 (×2): qty 1

## 2023-10-24 MED ORDER — ISOSORBIDE MONONITRATE ER 30 MG PO TB24
15.0000 mg | ORAL_TABLET | Freq: Every day | ORAL | Status: DC
  Administered 2023-10-25: 15 mg via ORAL
  Filled 2023-10-24: qty 1

## 2023-10-24 NOTE — Evaluation (Signed)
Physical Therapy Evaluation Patient Details Name: Bobby [name] MRN: [id number] DOB: 06-26-1946 Today's Date: 10/24/2023  History of Present Illness  77yo M who presented to [facility] on 10/23/23 due to persistent drainage from site of his surgical drain. Of note, had recent hospital admit from 1/27 to 2/7 for peritonitis secondary to acute appendicitis and septic shock, had laparoscopic appendectomy and blake drain placement 1/28 with subsequent drain removal on 2/6. Admitted with intra-abdominal abscesses following laparoscopic appendectomy. PMH COPD, hx chronic hypoxemic respiratory failure with intermittent home O2 use, CVA, CAD s/p PCI, anterior wall infarction, DMII, chronic HFrEF s/p CRT-D, CKD, HLD, HTN, anxiety/depression  Clinical Impression    Pt received in bed, pleasant and cooperative with PT. BP had been a bit low earlier today, unable to get reliable measure with cuff this afternoon. Able to mobilize well today but had dizziness/wooziness with positional changes consistent with likely orthostatic BP drop and needed MinA for balance in standing. Did not assess gait due to sx/not able to get BP. Would likely do well with HHPT once medically ready for DC, sounds like he has good support at home from family and friends.         If plan is discharge home, recommend the following: A lot of help with bathing/dressing/bathroom;A lot of help with walking and/or transfers;Assistance with cooking/housework;Assist for transportation;Help with stairs or ramp for entrance   Can travel by private vehicle   No    Equipment Recommendations Rolling walker (2 wheels);BSC/3in1  Recommendations for Other Services       Functional Status Assessment Patient has had a recent decline in their functional status and demonstrates the ability to make significant improvements in function in a reasonable and predictable amount of time.     Precautions / Restrictions Precautions Precautions:  Fall Precaution/Restrictions Comments: abdominal surgical site Restrictions Weight Bearing Restrictions Per Provider Order: No      Mobility  Bed Mobility Overal bed mobility: Needs Assistance Bed Mobility: Sidelying to Sit, Sit to Sidelying   Sidelying to sit: Modified independent (Device/Increase time), HOB elevated, Used rails     Sit to sidelying: Modified independent (Device/Increase time), Used rails, HOB elevated      Transfers Overall transfer level: Needs assistance Equipment used: None Transfers: Sit to/from Stand Sit to Stand: Contact guard assist           General transfer comment: Min guard but unsteady with no device, possibly due to general wooziness    Ambulation/Gait               General Gait Details: DNT due to wooziness, hx of low BP earlier today  Stairs            Wheelchair Mobility     Tilt Bed    Modified Rankin (Stroke Patients Only)       Balance Overall balance assessment: Needs assistance Sitting-balance support: No upper extremity supported, Feet supported Sitting balance-Leahy Scale: Normal     Standing balance support: No upper extremity supported, During functional activity Standing balance-Leahy Scale: Poor Standing balance comment: MinA                             Pertinent Vitals/Pain Pain Assessment Pain Assessment: No/denies pain Pain Score: 0-No pain Faces Pain Scale: No hurt    Home Living Family/patient expects to be discharged to:: Private residence Living Arrangements: Alone Available Help at Discharge: Available PRN/intermittently;Friend(s);Family Type of Home: Apartment Home Access:  Stairs to enter Entrance Stairs-Rails: None Entrance Stairs-Number of Steps: 1   Home Layout: One level Home Equipment: Shower seat;Grab bars - tub/shower Additional Comments: has aide for 1-2 hours/day MWF, intermittent use of O2 at home but mostly at night per his report, having some work done  on the shower today    Prior Function Prior Level of Function : Independent/Modified Independent             Mobility Comments: independent ADLs Comments: independent, loves to cook, still driving     Extremity/Trunk Assessment   Upper Extremity Assessment Upper Extremity Assessment: Defer to OT evaluation    Lower Extremity Assessment Lower Extremity Assessment: Generalized weakness    Cervical / Trunk Assessment Cervical / Trunk Assessment: Kyphotic  Communication   Communication Communication: No apparent difficulties    Cognition Arousal: Alert Behavior During Therapy: WFL for tasks assessed/performed   PT - Cognitive impairments: No apparent impairments                                 Cueing Cueing Techniques: Verbal cues     General Comments General comments (skin integrity, edema, etc.): VSS with activity on RA    Exercises     Assessment/Plan    PT Assessment Patient needs continued PT services  PT Problem List Decreased strength;Decreased activity tolerance;Decreased balance;Decreased mobility;Decreased knowledge of use of DME;Obesity;Pain       PT Treatment Interventions DME instruction;Gait training;Functional mobility training;Therapeutic activities;Stair training;Balance training;Patient/family education    PT Goals (Current goals can be found in the Care Plan section)  Acute Rehab PT Goals Patient Stated Goal: return to prior level of function, make sure the drainage situation is taken care of so he doesn't have to come back to the hospital PT Goal Formulation: With patient Time For Goal Achievement: 11/07/23 Potential to Achieve Goals: Good    Frequency Min 1X/week     Co-evaluation               AM-PAC PT "6 Clicks" Mobility  Outcome Measure Help needed turning from your back to your side while in a flat bed without using bedrails?: None Help needed moving from lying on your back to sitting on the side of a flat  bed without using bedrails?: A Little Help needed moving to and from a bed to a chair (including a wheelchair)?: A Little Help needed standing up from a chair using your arms (e.g., wheelchair or bedside chair)?: A Little Help needed to walk in hospital room?: A Lot Help needed climbing 3-5 steps with a railing? : A Lot 6 Click Score: 17    End of Session   Activity Tolerance: Treatment limited secondary to medical complications (Comment) (feeling woozy with positional changes, possible hypotension) Patient left: in bed;with call bell/phone within reach;with bed alarm set Nurse Communication: Mobility status;Other (comment) (sx with positional changes, IV beeping, surgical site drainage) PT Visit Diagnosis: Other abnormalities of gait and mobility (R26.89);Muscle weakness (generalized) (M62.81)    Time: 1610-9604 PT Time Calculation (min) (ACUTE ONLY): 36 min   Charges:   PT Evaluation $PT Eval Moderate Complexity: 1 Mod PT Treatments $Therapeutic Activity: 8-22 mins PT General Charges $$ ACUTE PT VISIT: 1 Visit        Nedra Hai, PT, DPT 10/24/23 1:59 PM

## 2023-10-24 NOTE — Progress Notes (Signed)
PROGRESS NOTE    Bobby [name]  GNF:[id number] DOB: November 27, 1945 DOA: 10/22/2023 PCP: Dois Davenport, MD    Chief Complaint  Patient presents with   Post-op Problem   Urinary Retention    Brief Narrative:   78 year old male with history of COPD, chronic toxemic respiratory failure on baseline 2 L nasal cannula, prior CVA, CAD status post PCI, anterior wall infarct, history of low flow state on echo with risk for LV thrombus on Eliquis, diabetes, chronic systolic CHF with EF of 35 to 84%, CKD stage IV who was recently discharged on 10/11/2023 for peritonitis secondary to acute appendicitis undergoing laparoscopic appendectomy and drain placement.  Following outpatient drain removal, patient had increased drainage from surgical wound, prompting visit to the emergency department.   Emergency department, patient was noted to have a fluid collection on CT abdomen concerning for abdominal abscess.  Patient was noted to have a white blood count of 13,000.  General surgery was consulted.  Patient was continued on empiric Zosyn.   Assessment & Plan:   Principal Problem:   Abscess of abdominal cavity (HCC) Active Problems:   COPD (chronic obstructive pulmonary disease) (HCC)   CAD (coronary artery disease)   Non-insulin dependent type 2 diabetes mellitus (HCC)   SBO (small bowel obstruction) (HCC)  Intra-abdominal abscess following laparoscopic appendectomy -IR input greatly appreciated fluid collection is too small to drain, and improving, no need for drain at this point -Input greatly appreciated, possibly this is seroma, not abscess, recommendation for 5 days of antibiotics   COPD -No audible wheezing when seen this morning -On baseline 2 L nasal cannula   CAD status post PCI Hypertension -Denies chest pains -Hold his blood pressure is soft, will keep on same dose Coreg, will DC Norvasc, will decrease his hydralazine and Imdur dose   History of anterior wall infarction with evidence  of low flow state  -Will resume Eliquis now there is no anticipation of surgery or procedures  Type 2 diabetes mellitus -Glycemic trends appear stable -Continue sliding scale as needed   Chronic systolic congestive heart failure -Appears euvolemic this morning -Creatinine appears near baseline.  Continue with home dose Lasix.   CKD stage IV -Creatinine currently appears to be near baseline -Diet was resumed per cardiology, have resumed home Lasix -Continue to follow renal function trends   Hypertension -Blood pressure currently stable and well-controlled please see above discussion regarding adjustment of medications     DVT prophylaxis: Eliquis Code Status: Full code Family Communication: None at bedside Disposition:   Status is: Inpatient    Consultants:  Surgery  Subjective: Denies any fever or chills  Objective: Vitals:   10/24/23 0819 10/24/23 0838 10/24/23 1152 10/24/23 1217  BP: 135/62   (!) 99/57  Pulse: 73 77  71  Resp: 17 20  16   Temp: (!) 97.2 F (36.2 C)     TempSrc: Oral     SpO2: 95% 93% 94% 96%    Intake/Output Summary (Last 24 hours) at 10/24/2023 1439 Last data filed at 10/24/2023 6962 Gross per 24 hour  Intake 140.08 ml  Output 580 ml  Net -439.92 ml   There were no vitals filed for this visit.  Examination:  Awake Alert, Oriented X 3, No new F.N deficits, Normal affect Symmetrical Chest wall movement, Good air movement bilaterally, CTAB RRR,No Gallops,Rubs or new Murmurs, No Parasternal Heave +ve B.Sounds, Abd Soft, No tenderness,  No Cyanosis, Clubbing or edema, No new Rash or bruise  Data Reviewed: I have personally reviewed following labs and imaging studies  CBC: Recent Labs  Lab 10/22/23 1730 10/23/23 0448 10/24/23 0452  WBC 13.0* 12.2* 11.9*  NEUTROABS 10.9*  --   --   HGB 11.7* 11.5* 11.7*  HCT 39.0 37.0* 38.4*  MCV 93.5 91.1 92.8  PLT 298 281 286    Basic Metabolic Panel: Recent Labs  Lab 10/22/23 1730  10/23/23 0448 10/24/23 0452  NA 138 138 143  K 4.4 4.3 4.7  CL 107 109 110  CO2 21* 19* 19*  GLUCOSE 118* 91 94  BUN 35* 36* 36*  CREATININE 2.58* 2.83* 2.92*  CALCIUM 9.5 9.4 9.7    GFR: Estimated Creatinine Clearance: 27.1 mL/min (A) (by C-G formula based on SCr of 2.92 mg/dL (H)).  Liver Function Tests: Recent Labs  Lab 10/22/23 1730 10/24/23 0452  AST 28 26  ALT 29 25  ALKPHOS 83 75  BILITOT 0.5 0.5  PROT 5.9* 5.8*  ALBUMIN 2.3* 2.1*    CBG: Recent Labs  Lab 10/23/23 2017 10/23/23 2316 10/24/23 0423 10/24/23 0819 10/24/23 1209  GLUCAP 81 74 73 85 99     No results found for this or any previous visit (from the past 240 hours).       Radiology Studies: CT ABDOMEN PELVIS WO CONTRAST Result Date: 10/22/2023 CLINICAL DATA:  Abdomen pain EXAM: CT ABDOMEN AND PELVIS WITHOUT CONTRAST TECHNIQUE: Multidetector CT imaging of the abdomen and pelvis was performed following the standard protocol without IV contrast. RADIATION DOSE REDUCTION: This exam was performed according to the departmental dose-optimization program which includes automated exposure control, adjustment of the mA and/or kV according to patient size and/or use of iterative reconstruction technique. COMPARISON:  CT 10/07/2023, 09/30/2023 FINDINGS: Lower chest: Lung bases demonstrate dependent atelectasis. Cardiac pacing leads. Hepatobiliary: Contracted gallbladder. No calcified stones. Interval development of low-density collection in the inferior right hepatic lobe measuring 3.6 x 3 cm on series 3, image 36. Pancreas: Unremarkable. No pancreatic ductal dilatation or surrounding inflammatory changes. Spleen: Normal in size without focal abnormality. Adrenals/Urinary Tract: Adrenal glands are normal. Kidneys show no hydronephrosis. Bilateral renal cysts and focal hyperdensity in the midpole left kidney for which no imaging follow-up is recommended. Bladder is unremarkable Stomach/Bowel: Stomach nonenlarged.  Patient is status post appendectomy. Increased fluid-filled small bowel dilatation in the lower abdomen and pelvis with bowel distended up to 4.4 cm. Decompressed small bowel distally, findings are suspect for bowel obstruction, possibly due to adhesive disease, slightly tethered appearance of dilated and nondilated small bowel in the right lower quadrant on series 3, image 71. Vascular/Lymphatic: Aortic atherosclerosis. No enlarged abdominal or pelvic lymph nodes. Reproductive: Prostate is unremarkable.  Penile implant. Other: Negative for pelvic effusion. No free air. Interim removal of abdominal drainage catheter. Slightly thick rimmed fluid collection in the right gutter, measures about 6.6 cm craniocaudad by 1.2 cm thick on series 6, image 73 and series 3, image 52 and probably represents organizing fluid collection. Fluid and edema within the right greater than left flank regions without definite rim enhancement. Musculoskeletal: Degenerative changes. No acute osseous abnormality. IMPRESSION: 1. Interim removal of abdominal drainage catheter. Slightly thick rimmed fluid collection in the right gutter measuring up to 6.6 cm craniocaudad by 1.2 cm thick, probably representing organizing fluid collection/possible abscess. Interval development of low-density collection in the inferior right hepatic lobe measuring up to 3.6 cm, suspect for abscess as this was not present on the recent priors. 2. Increased fluid-filled small bowel dilatation in  the lower abdomen and pelvis with decompressed small bowel distally, findings are suspect for bowel obstruction, possibly due to adhesive disease. 3. Aortic atherosclerosis. Aortic Atherosclerosis (ICD10-I70.0). Electronically Signed   By: Jasmine Pang M.D.   On: 10/22/2023 21:41        Scheduled Meds:  amLODipine  5 mg Oral Daily   atorvastatin  80 mg Oral q1800   buPROPion  300 mg Oral Daily   carvedilol  25 mg Oral BID WC   colchicine  0.6 mg Oral Daily    fluticasone furoate-vilanterol  1 puff Inhalation Daily   And   umeclidinium bromide  1 puff Inhalation Daily   hydrALAZINE  50 mg Oral Q8H   insulin aspart  0-9 Units Subcutaneous Q4H   isosorbide mononitrate  30 mg Oral Daily   pantoprazole  40 mg Oral QHS   sertraline  100 mg Oral Daily   Continuous Infusions:  piperacillin-tazobactam (ZOSYN)  IV 3.375 g (10/24/23 1208)     LOS: 1 day      Huey Bienenstock, MD [facility]   To contact the attending provider between 7A-7P or the covering provider during after hours 7P-7A, please log into the web site [email or web address] and access using universal [place] password for that web site. If you do not have the password, please call the hospital operator.  10/24/2023, 2:39 PM

## 2023-10-24 NOTE — Progress Notes (Signed)
Subjective: Feels well today.  Wanting to go home.  Eating well.  Drainage from wound site has always been clear per patient.  IR unable to drain fluid collection noted on CT yesterday as it was too small.  No further drainage  ROS: See above, otherwise other systems negative  Objective: Vital signs in last 24 hours: Temp:  [97.2 F (36.2 C)-98 F (36.7 C)] 97.2 F (36.2 C) (02/20 0819) Pulse Rate:  [64-77] 73 (02/20 0819) Resp:  [14-20] 17 (02/20 0819) BP: (101-139)/(51-75) 135/62 (02/20 0819) SpO2:  [94 %-98 %] 95 % (02/20 0819)    Intake/Output from previous day: 02/19 0701 - 02/20 0700 In: 140.1 [IV Piggyback:140.1] Out: 880 [Urine:880] Intake/Output this shift: No intake/output data recorded.  PE: Abd: soft, NT, Nd, obese, incisions well healed.  LLQ incision opened slightly with a Q-tip.  Serous fluid drained, about 5cc.  No erythema or evidence of infection.  No cloudiness to the fluid.  Lab Results:  Recent Labs    10/23/23 0448 10/24/23 0452  WBC 12.2* 11.9*  HGB 11.5* 11.7*  HCT 37.0* 38.4*  PLT 281 286   BMET Recent Labs    10/23/23 0448 10/24/23 0452  NA 138 143  K 4.3 4.7  CL 109 110  CO2 19* 19*  GLUCOSE 91 94  BUN 36* 36*  CREATININE 2.83* 2.92*  CALCIUM 9.4 9.7   PT/INR No results for input(s): "LABPROT", "INR" in the last 72 hours. CMP     Component Value Date/Time   NA 143 10/24/2023 0452   NA 140 01/11/2023 1332   K 4.7 10/24/2023 0452   CL 110 10/24/2023 0452   CO2 19 (L) 10/24/2023 0452   GLUCOSE 94 10/24/2023 0452   BUN 36 (H) 10/24/2023 0452   BUN 55 (H) 01/11/2023 1332   CREATININE 2.92 (H) 10/24/2023 0452   CREATININE 1.66 (H) 08/02/2016 1028   CALCIUM 9.7 10/24/2023 0452   PROT 5.8 (L) 10/24/2023 0452   PROT 6.5 01/11/2023 1332   ALBUMIN 2.1 (L) 10/24/2023 0452   ALBUMIN 4.4 01/11/2023 1332   AST 26 10/24/2023 0452   ALT 25 10/24/2023 0452   ALKPHOS 75 10/24/2023 0452   BILITOT 0.5 10/24/2023 0452   BILITOT  0.2 01/11/2023 1332   GFRNONAA 21 (L) 10/24/2023 0452   GFRAA 32 (L) 11/05/2019 1413   Lipase     Component Value Date/Time   LIPASE 38 10/22/2023 1730       Studies/Results: CT ABDOMEN PELVIS WO CONTRAST Result Date: 10/22/2023 CLINICAL DATA:  Abdomen pain EXAM: CT ABDOMEN AND PELVIS WITHOUT CONTRAST TECHNIQUE: Multidetector CT imaging of the abdomen and pelvis was performed following the standard protocol without IV contrast. RADIATION DOSE REDUCTION: This exam was performed according to the departmental dose-optimization program which includes automated exposure control, adjustment of the mA and/or kV according to patient size and/or use of iterative reconstruction technique. COMPARISON:  CT 10/07/2023, 09/30/2023 FINDINGS: Lower chest: Lung bases demonstrate dependent atelectasis. Cardiac pacing leads. Hepatobiliary: Contracted gallbladder. No calcified stones. Interval development of low-density collection in the inferior right hepatic lobe measuring 3.6 x 3 cm on series 3, image 36. Pancreas: Unremarkable. No pancreatic ductal dilatation or surrounding inflammatory changes. Spleen: Normal in size without focal abnormality. Adrenals/Urinary Tract: Adrenal glands are normal. Kidneys show no hydronephrosis. Bilateral renal cysts and focal hyperdensity in the midpole left kidney for which no imaging follow-up is recommended. Bladder is unremarkable Stomach/Bowel: Stomach nonenlarged. Patient is status post appendectomy.  Increased fluid-filled small bowel dilatation in the lower abdomen and pelvis with bowel distended up to 4.4 cm. Decompressed small bowel distally, findings are suspect for bowel obstruction, possibly due to adhesive disease, slightly tethered appearance of dilated and nondilated small bowel in the right lower quadrant on series 3, image 71. Vascular/Lymphatic: Aortic atherosclerosis. No enlarged abdominal or pelvic lymph nodes. Reproductive: Prostate is unremarkable.  Penile  implant. Other: Negative for pelvic effusion. No free air. Interim removal of abdominal drainage catheter. Slightly thick rimmed fluid collection in the right gutter, measures about 6.6 cm craniocaudad by 1.2 cm thick on series 6, image 73 and series 3, image 52 and probably represents organizing fluid collection. Fluid and edema within the right greater than left flank regions without definite rim enhancement. Musculoskeletal: Degenerative changes. No acute osseous abnormality. IMPRESSION: 1. Interim removal of abdominal drainage catheter. Slightly thick rimmed fluid collection in the right gutter measuring up to 6.6 cm craniocaudad by 1.2 cm thick, probably representing organizing fluid collection/possible abscess. Interval development of low-density collection in the inferior right hepatic lobe measuring up to 3.6 cm, suspect for abscess as this was not present on the recent priors. 2. Increased fluid-filled small bowel dilatation in the lower abdomen and pelvis with decompressed small bowel distally, findings are suspect for bowel obstruction, possibly due to adhesive disease. 3. Aortic atherosclerosis. Aortic Atherosclerosis (ICD10-I70.0). Electronically Signed   By: Jasmine Pang M.D.   On: 10/22/2023 21:41    Anti-infectives: Anti-infectives (From admission, onward)    Start     Dose/Rate Route Frequency Ordered Stop   10/23/23 0600  piperacillin-tazobactam (ZOSYN) IVPB 3.375 g        3.375 g 12.5 mL/hr over 240 Minutes Intravenous Every 8 hours 10/23/23 0339     10/22/23 2200  piperacillin-tazobactam (ZOSYN) IVPB 3.375 g        3.375 g 12.5 mL/hr over 240 Minutes Intravenous  Once 10/22/23 2152 10/23/23 4098        Assessment/Plan S/p lap appy for gangrenous appendicitis, Dr. Azucena Cecil 1/28 with post op fluid collection -unable to drain per IR attempt -fluid appears to be just serous with no overt evidence of infection -WBC 11.9K today.  Can treat with 5 days of abx therapy to cover incase  there is some infection more intra-abdominally -tolerating a diet and having no pain -surgically stable for DC home. -has follow up in our office already -d/w primary service.  FEN - renal diet VTE - may resume eliquis ID - zosyn, can transition to augmentin for 5 days totals  MMP    LOS: 1 day    Letha Cape , [facility] Surgery 10/24/2023, 9:39 AM Please see Amion for pager number during day hours 7:00am-4:30pm or 7:00am -11:30am on weekends

## 2023-10-24 NOTE — Progress Notes (Signed)
Interventional Radiology Brief Note:  Patient s/p lap appendectomy 1/28 with surgical drain left in place.  Drain was removed 2/6, however patient has continued with drainage from the insertion site since this time prompting patient to seek evaluation.  CT Abdomen Pelvis performed 10/22/23 shows residual, small fluid collection.  IR consulted for aspiration and drainage, however per Dr. Elby Showers collection remains small and noteably improved from scan 2/3.  Does not recommend aspiration and drainage at this time.    No procedure performed.  Further management per primary teams.   Loyce Dys, MS RD PA-C 9:53 AM

## 2023-10-25 ENCOUNTER — Other Ambulatory Visit (HOSPITAL_COMMUNITY): Payer: Self-pay

## 2023-10-25 DIAGNOSIS — J449 Chronic obstructive pulmonary disease, unspecified: Secondary | ICD-10-CM | POA: Diagnosis not present

## 2023-10-25 DIAGNOSIS — K651 Peritoneal abscess: Secondary | ICD-10-CM | POA: Diagnosis not present

## 2023-10-25 LAB — GLUCOSE, CAPILLARY
Glucose-Capillary: 85 mg/dL (ref 70–99)
Glucose-Capillary: 90 mg/dL (ref 70–99)
Glucose-Capillary: 91 mg/dL (ref 70–99)

## 2023-10-25 MED ORDER — ISOSORBIDE MONONITRATE ER 30 MG PO TB24
15.0000 mg | ORAL_TABLET | Freq: Every day | ORAL | Status: DC
Start: 2023-10-25 — End: 2023-11-12

## 2023-10-25 MED ORDER — HYDRALAZINE HCL 50 MG PO TABS: 25.0000 mg | ORAL_TABLET | Freq: Three times a day (TID) | ORAL | Status: DC

## 2023-10-25 MED ORDER — AMOXICILLIN-POT CLAVULANATE 875-125 MG PO TABS
1.0000 | ORAL_TABLET | Freq: Two times a day (BID) | ORAL | 0 refills | Status: AC
  Filled 2023-10-25: qty 8, 4d supply, fill #0

## 2023-10-25 NOTE — TOC Transition Note (Addendum)
Transition of Care [facility]) - Discharge Note   Patient Details  Name: Bobby [name] MRN: [id number] Date of Birth: Oct 08, 1945  [facility] CM/SW Contact:  Gordy Clement, RN Phone Number: 10/25/2023, 9:13 AM   Clinical Narrative:     Patient will DC to home today [facility] health will resume services. Rolling walker and BSC to be delivered bedside prior to dc. Sister, [name] will be transporting home. AVS has been updated           Patient Goals and CMS Choice            Discharge Placement                       Discharge Plan and Services Additional resources added to the After Visit Summary for                                       Social Drivers of Health (SDOH) Interventions SDOH Screenings   Food Insecurity: No Food Insecurity (10/23/2023)  Housing: Low Risk  (10/23/2023)  Transportation Needs: No Transportation Needs (10/23/2023)  Utilities: Not At Risk (10/23/2023)  Social Connections: Socially Isolated (10/23/2023)  Tobacco Use: High Risk (10/22/2023)     Readmission Risk Interventions    09/11/2021   12:13 PM  Readmission Risk Prevention Plan  Transportation Screening Complete  PCP or Specialist Appt within 3-5 Days Complete  HRI or Home Care Consult Complete  Social Work Consult for Recovery Care Planning/Counseling Complete  Palliative Care Screening Not Applicable  Medication Review [employer]) Complete

## 2023-10-25 NOTE — Progress Notes (Signed)
Physical Therapy Treatment Patient Details Name: Bobby [name] MRN: [id number] DOB: December 14, 1945 Today's Date: 10/25/2023   History of Present Illness 77yo M who presented to [facility] on 10/23/23 due to persistent drainage from site of his surgical drain. Of note, had recent hospital admit from 1/27 to 2/7 for peritonitis secondary to acute appendicitis and septic shock, had laparoscopic appendectomy and blake drain placement 1/28 with subsequent drain removal on 2/6. Admitted with intra-abdominal abscesses following laparoscopic appendectomy. PMH COPD, hx chronic hypoxemic respiratory failure with intermittent home O2 use, CVA, CAD s/p PCI, anterior wall infarction, DMII, chronic HFrEF s/p CRT-D, CKD, HLD, HTN, anxiety/depression    PT Comments  Pt tolerated treatment well today. Pt was able to progress ambulation in hallway with RW CGA. Pt did have one episode of trying to sit prematurely on the bed requiring Min/Mod A to prevent fall. No change in DC/DME recs at this time. Pt anticipates DC home today.    If plan is discharge home, recommend the following: A lot of help with bathing/dressing/bathroom;A lot of help with walking and/or transfers;Assistance with cooking/housework;Assist for transportation;Help with stairs or ramp for entrance   Can travel by private vehicle     No  Equipment Recommendations  Rolling walker (2 wheels);BSC/3in1    Recommendations for Other Services       Precautions / Restrictions Precautions Precautions: Fall Recall of Precautions/Restrictions: Impaired Restrictions Weight Bearing Restrictions Per Provider Order: No     Mobility  Bed Mobility Overal bed mobility: Needs Assistance Bed Mobility: Supine to Sit     Supine to sit: Min assist     General bed mobility comments: HHA for trunk elevation.    Transfers Overall transfer level: Needs assistance Equipment used: Rolling walker (2 wheels) Transfers: Sit to/from Stand Sit to Stand: Contact guard  assist           General transfer comment: Cues for hand placement.    Ambulation/Gait Ambulation/Gait assistance: Contact guard assist, Min assist Gait Distance (Feet): 75 Feet Assistive device: Rolling walker (2 wheels) Gait Pattern/deviations: Decreased stride length, Step-through pattern Gait velocity: decreased     General Gait Details: Overall CGA however pt did try to sit on bed prematurely requiring Min A to prevent fall. Pt and daughter extensively reinforced safety with pt.   Stairs             Wheelchair Mobility     Tilt Bed    Modified Rankin (Stroke Patients Only)       Balance Overall balance assessment: Needs assistance Sitting-balance support: No upper extremity supported, Feet supported       Standing balance support: No upper extremity supported, During functional activity Standing balance-Leahy Scale: Poor Standing balance comment: Reliant on RW                            Communication Communication Communication: No apparent difficulties  Cognition Arousal: Alert Behavior During Therapy: WFL for tasks assessed/performed   PT - Cognitive impairments: No apparent impairments                         Following commands: Intact      Cueing Cueing Techniques: Verbal cues  Exercises      General Comments General comments (skin integrity, edema, etc.): VSS      Pertinent Vitals/Pain Pain Assessment Pain Assessment: No/denies pain    Home Living  Prior Function            PT Goals (current goals can now be found in the care plan section) Progress towards PT goals: Progressing toward goals    Frequency    Min 1X/week      PT Plan      Co-evaluation              AM-PAC PT "6 Clicks" Mobility   Outcome Measure  Help needed turning from your back to your side while in a flat bed without using bedrails?: None Help needed moving from lying on your back  to sitting on the side of a flat bed without using bedrails?: A Little Help needed moving to and from a bed to a chair (including a wheelchair)?: A Little Help needed standing up from a chair using your arms (e.g., wheelchair or bedside chair)?: A Little Help needed to walk in hospital room?: A Little Help needed climbing 3-5 steps with a railing? : A Lot 6 Click Score: 18    End of Session Equipment Utilized During Treatment: Gait belt Activity Tolerance: Patient tolerated treatment well Patient left: in bed;with call bell/phone within reach;with bed alarm set Nurse Communication: Mobility status PT Visit Diagnosis: Other abnormalities of gait and mobility (R26.89);Muscle weakness (generalized) (M62.81)     Time: 0981-1914 PT Time Calculation (min) (ACUTE ONLY): 8 min  Charges:    $Gait Training: 8-22 mins PT General Charges $$ ACUTE PT VISIT: 1 Visit                     Shela Nevin, PT, DPT Acute Rehab Services [phone]    Gladys Damme 10/25/2023, 11:38 AM

## 2023-10-25 NOTE — Discharge Instructions (Signed)
Follow with Primary MD Dois Davenport, MD in 7 days   Get CBC, CMP, checked  by Primary MD next visit.    Activity: As tolerated with Full fall precautions use walker/cane & assistance as needed   Disposition Home    Diet: Heart Healthy   On your next visit with your primary care physician please Get Medicines reviewed and adjusted.   Please request your Prim.MD to go over all Hospital Tests and Procedure/Radiological results at the follow up, please get all Hospital records sent to your Prim MD by signing hospital release before you go home.   If you experience worsening of your admission symptoms, develop shortness of breath, life threatening emergency, suicidal or homicidal thoughts you must seek medical attention immediately by calling 911 or calling your MD immediately  if symptoms less severe.  You Must read complete instructions/literature along with all the possible adverse reactions/side effects for all the Medicines you take and that have been prescribed to you. Take any new Medicines after you have completely understood and accpet all the possible adverse reactions/side effects.   Do not drive, operating heavy machinery, perform activities at heights, swimming or participation in water activities or provide baby sitting services if your were admitted for syncope or siezures until you have seen by Primary MD or a Neurologist and advised to do so again.  Do not drive when taking Pain medications.    Do not take more than prescribed Pain, Sleep and Anxiety Medications  Special Instructions: If you have smoked or chewed Tobacco  in the last 2 yrs please stop smoking, stop any regular Alcohol  and or any Recreational drug use.  Wear Seat belts while driving.   Please note  You were cared for by a hospitalist during your hospital stay. If you have any questions about your discharge medications or the care you received while you were in the hospital after you are  discharged, you can call the unit and asked to speak with the hospitalist on call if the hospitalist that took care of you is not available. Once you are discharged, your primary care physician will handle any further medical issues. Please note that NO REFILLS for any discharge medications will be authorized once you are discharged, as it is imperative that you return to your primary care physician (or establish a relationship with a primary care physician if you do not have one) for your aftercare needs so that they can reassess your need for medications and monitor your lab values.

## 2023-10-25 NOTE — Progress Notes (Signed)
Bobby [name] to be D/C'd home with home health per MD order. Discussed with the patient and daughter, [name] and all questions fully answered. Daughter declined walker at [facility], stated they have DME at home. RN case manager aware. TOC medications delivered to bedside.  Skin clean, dry and intact without evidence of skin break down, no evidence of skin tears noted. Dressing to lower abdomen replaced before discharge. IV catheter discontinued intact. Site without signs and symptoms of complications. Dressing and pressure applied.  An After Visit Summary was printed and given to the patient.  Patient escorted via WC, and D/C home via private auto.  [name]  10/25/2023 12:26 PM

## 2023-10-25 NOTE — Progress Notes (Signed)
Subjective: No new complaints.  Ready to go home.  Persistent clear serous leakage from LLQ incision  ROS: See above, otherwise other systems negative  Objective: Vital signs in last 24 hours: Temp:  [97.6 F (36.4 C)-97.9 F (36.6 C)] 97.7 F (36.5 C) (02/21 0810) Pulse Rate:  [71-88] 74 (02/21 0402) Resp:  [12-28] 12 (02/21 0810) BP: (85-146)/(41-70) 123/67 (02/21 0810) SpO2:  [92 %-96 %] 92 % (02/21 0810)    Intake/Output from previous day: 02/20 0701 - 02/21 0700 In: 118.5 [IV Piggyback:118.5] Out: 550 [Urine:550] Intake/Output this shift: Total I/O In: 240 [P.O.:240] Out: -   PE: Abd: soft, NT, Nd, obese, incisions well healed.  LLQ incision still draining clear serous fluid.  Lab Results:  Recent Labs    10/23/23 0448 10/24/23 0452  WBC 12.2* 11.9*  HGB 11.5* 11.7*  HCT 37.0* 38.4*  PLT 281 286   BMET Recent Labs    10/23/23 0448 10/24/23 0452  NA 138 143  K 4.3 4.7  CL 109 110  CO2 19* 19*  GLUCOSE 91 94  BUN 36* 36*  CREATININE 2.83* 2.92*  CALCIUM 9.4 9.7   PT/INR No results for input(s): "LABPROT", "INR" in the last 72 hours. CMP     Component Value Date/Time   NA 143 10/24/2023 0452   NA 140 01/11/2023 1332   K 4.7 10/24/2023 0452   CL 110 10/24/2023 0452   CO2 19 (L) 10/24/2023 0452   GLUCOSE 94 10/24/2023 0452   BUN 36 (H) 10/24/2023 0452   BUN 55 (H) 01/11/2023 1332   CREATININE 2.92 (H) 10/24/2023 0452   CREATININE 1.66 (H) 08/02/2016 1028   CALCIUM 9.7 10/24/2023 0452   PROT 5.8 (L) 10/24/2023 0452   PROT 6.5 01/11/2023 1332   ALBUMIN 2.1 (L) 10/24/2023 0452   ALBUMIN 4.4 01/11/2023 1332   AST 26 10/24/2023 0452   ALT 25 10/24/2023 0452   ALKPHOS 75 10/24/2023 0452   BILITOT 0.5 10/24/2023 0452   BILITOT 0.2 01/11/2023 1332   GFRNONAA 21 (L) 10/24/2023 0452   GFRAA 32 (L) 11/05/2019 1413   Lipase     Component Value Date/Time   LIPASE 38 10/22/2023 1730       Studies/Results: No results  found.   Anti-infectives: Anti-infectives (From admission, onward)    Start     Dose/Rate Route Frequency Ordered Stop   10/25/23 0000  amoxicillin-clavulanate (AUGMENTIN) 875-125 MG tablet        1 tablet Oral 2 times daily 10/25/23 0809 10/29/23 2359   10/23/23 0600  piperacillin-tazobactam (ZOSYN) IVPB 3.375 g        3.375 g 12.5 mL/hr over 240 Minutes Intravenous Every 8 hours 10/23/23 0339     10/22/23 2200  piperacillin-tazobactam (ZOSYN) IVPB 3.375 g        3.375 g 12.5 mL/hr over 240 Minutes Intravenous  Once 10/22/23 2152 10/23/23 2841        Assessment/Plan S/p lap appy for gangrenous appendicitis, Dr. Azucena Cecil 1/28 with post op fluid collection -unable to drain per IR attempt -fluid appears to be just serous with no overt evidence of infection -WBC 11.9K yesterday.  Can treat with 5 days of abx therapy to cover incase there is some infection more intra-abdominally -tolerating a diet and having no pain -surgically stable for DC home. -has follow up in our office already -d/w primary service.  FEN - renal diet VTE - may resume eliquis ID - [email or web address], can transition to  augmentin for 5 days totals  MMP    LOS: 2 days    Bobby [name] , [facility] 10/25/2023, 10:26 AM Please see Amion for pager number during day hours 7:00am-4:30pm or 7:00am -11:30am on weekends

## 2023-10-25 NOTE — Plan of Care (Signed)

## 2023-10-25 NOTE — Discharge Summary (Signed)
Physician Discharge Summary  Bobby [name]:[id number] DOB: 06-18-1946 DOA: 10/22/2023  PCP: Dois Davenport, MD  Admit date: 10/22/2023 Discharge date: 10/25/2023  Admitted From: (Home) Disposition:  (Home)  Recommendations for Outpatient Follow-up:  Follow up with PCP in 1-2 weeks Please obtain BMP/CBC in one week    Diet recommendation: Heart Healthy  Brief/Interim Summary:  78 year old male with history of COPD, chronic toxemic respiratory failure on baseline 2 L nasal cannula, prior CVA, CAD status post PCI, anterior wall infarct, history of low flow state on echo with risk for LV thrombus on Eliquis, diabetes, chronic systolic CHF with EF of 35 to 81%, CKD stage IV who was recently discharged on 10/11/2023 for peritonitis secondary to acute appendicitis undergoing laparoscopic appendectomy and drain placement.  Following outpatient drain removal, patient had increased drainage from surgical wound, prompting visit to the emergency department.   Emergency department, patient was noted to have a fluid collection on CT abdomen concerning for abdominal abscess.  Patient was noted to have a white blood count of 13,000.  General surgery was consulted.  Patient was started empirically on Zosyn and admitted for further workup, please see discussion below.     S/p lap appy for gangrenous appendicitis, Dr. Azucena Cecil 1/28 with post op fluid collection  -IR input greatly appreciated fluid collection is too small to drain, and improving, no need for drain at this point -General Surgery input greatly appreciated, fluid appears to be just serous, with no overt evidence of infection, he was on IV Zosyn during hospital stay, leukocytosis has normalized, he will be discharged on oral Augmentin, outpatient follow-up with general surgery has been arranged.      COPD Chronic hypoxic respiratory failure -On 2 L nasal cannula intermittently at baseline, no wheezing   CAD status post  PCI Hypertension -Denies chest pains -Blood pressure has been soft overall, continue with Coreg, did discontinue Norvasc, did increase his Imdur and hydralazine dose by 50% .  History of anterior wall infarction with evidence of low flow state  -Will resume Eliquis now there is no anticipation of surgery or procedures   Type 2 diabetes mellitus -Glycemic trends appear stable -On insulin sliding scale during hospital stay   Chronic systolic congestive heart failure -Appears euvolemic -Creatinine appears near baseline.  Continue with home dose Lasix.   CKD stage IV -Creatinine currently appears to be near baseline      Discharge Diagnoses:  Principal Problem:   Abscess of abdominal cavity (HCC) Active Problems:   COPD (chronic obstructive pulmonary disease) (HCC)   CAD (coronary artery disease)   Non-insulin dependent type 2 diabetes mellitus (HCC)   SBO (small bowel obstruction) (HCC)    Discharge Instructions   Allergies as of 10/25/2023       Reactions   Hydrocodone Itching        Medication List     STOP taking these medications    acetaminophen 325 MG tablet Commonly known as: TYLENOL   amLODipine 5 MG tablet Commonly known as: NORVASC   predniSONE 10 MG tablet Commonly known as: DELTASONE       TAKE these medications    albuterol 108 (90 Base) MCG/ACT inhaler Commonly known as: VENTOLIN HFA Inhale 2 puffs into the lungs every 6 (six) hours as needed for wheezing or shortness of breath.   amoxicillin-clavulanate 875-125 MG tablet Commonly known as: AUGMENTIN Take 1 tablet by mouth 2 (two) times daily for 4 days.   apixaban 5 MG Tabs tablet Commonly  known as: Eliquis Take 1 tablet (5 mg total) by mouth 2 (two) times daily.   aspirin EC 81 MG tablet Take 1 tablet (81 mg total) by mouth daily. Swallow whole. What changed: when to take this   atorvastatin 80 MG tablet Commonly known as: LIPITOR Take 1 tablet (80 mg total) by mouth daily  at 6 PM.   buPROPion 300 MG 24 hr tablet Commonly known as: WELLBUTRIN XL Take 300 mg by mouth daily.   carvedilol 25 MG tablet Commonly known as: COREG TAKE ONE TABLET BY MOUTH TWICE DAILY   clonazePAM 0.5 MG tablet Commonly known as: KLONOPIN Take 1 tablet (0.5 mg total) by mouth at bedtime as needed (sleep). What changed: how much to take   Colcrys 0.6 MG tablet Generic drug: colchicine Take 0.6 mg by mouth daily.   COQ10 PO Take 1 capsule by mouth daily.   dapagliflozin propanediol 5 MG Tabs tablet Commonly known as: FARXIGA Take 5 mg by mouth daily.   feeding supplement Liqd Take 237 mLs by mouth 2 (two) times daily between meals.   FISH OIL PO Take 1 tablet by mouth daily.   furosemide 20 MG tablet Commonly known as: LASIX Take 1 tablet (20 mg total) by mouth daily. What changed: how much to take   hydrALAZINE 50 MG tablet Commonly known as: APRESOLINE Take 0.5 tablets (25 mg total) by mouth every 8 (eight) hours. What changed: how much to take   isosorbide mononitrate 30 MG 24 hr tablet Commonly known as: IMDUR Take 0.5 tablets (15 mg total) by mouth daily. What changed: how much to take   nitroGLYCERIN 0.4 MG SL tablet Commonly known as: NITROSTAT DISSOLVE 1 TABLET UNDER THE TONGUE EVERY 5 MINUTES AS NEEDED FOR CHEST PAIN. DO NOT EXCEED A TOTAL OF 3 DOSES IN 15 MINUTES.   oxyCODONE-acetaminophen 10-325 MG tablet Commonly known as: PERCOCET Take 1 tablet by mouth every 6 (six) hours as needed for pain.   pantoprazole 40 MG tablet Commonly known as: PROTONIX Take 1 tablet (40 mg total) by mouth at bedtime.   sertraline 100 MG tablet Commonly known as: ZOLOFT Take 100 mg by mouth daily.   Trelegy Ellipta 200-62.5-25 MCG/ACT Aepb Generic drug: Fluticasone-Umeclidin-Vilant Inhale 1 puff into the lungs daily.        Follow-up Information     Maczis, Hedda Slade, PA-C Follow up on 11/05/2023.   Specialty: General Surgery Why: 9:15am, Arrive 15  minutes prior to your appointment time, Please bring your insurance card and photo ID Contact information: [address] [facility] [place] [address] [phone]         [facility] Follow up.   Specialty: Home Health Services Why: Frances Furbish will contact you within 48 hours of dc to arrange a home health visit Contact information: [address] [place] [address] [phone]                Allergies  Allergen Reactions   Hydrocodone Itching    Consultations: General surgery   Procedures/Studies: CT ABDOMEN PELVIS WO CONTRAST Result Date: 10/22/2023 CLINICAL DATA:  Abdomen pain EXAM: CT ABDOMEN AND PELVIS WITHOUT CONTRAST TECHNIQUE: Multidetector CT imaging of the abdomen and pelvis was performed following the standard protocol without IV contrast. RADIATION DOSE REDUCTION: This exam was performed according to the departmental dose-optimization program which includes automated exposure control, adjustment of the mA and/or kV according to patient size and/or use of iterative reconstruction  technique. COMPARISON:  CT 10/07/2023, 09/30/2023 FINDINGS: Lower chest: Lung bases demonstrate dependent atelectasis. Cardiac pacing leads. Hepatobiliary: Contracted gallbladder. No calcified stones. Interval development of low-density collection in the inferior right hepatic lobe measuring 3.6 x 3 cm on series 3, image 36. Pancreas: Unremarkable. No pancreatic ductal dilatation or surrounding inflammatory changes. Spleen: Normal in size without focal abnormality. Adrenals/Urinary Tract: Adrenal glands are normal. Kidneys show no hydronephrosis. Bilateral renal cysts and focal hyperdensity in the midpole left kidney for which no imaging follow-up is recommended. Bladder is unremarkable Stomach/Bowel: Stomach nonenlarged. Patient is status post appendectomy. Increased fluid-filled small bowel dilatation in the lower abdomen and pelvis  with bowel distended up to 4.4 cm. Decompressed small bowel distally, findings are suspect for bowel obstruction, possibly due to adhesive disease, slightly tethered appearance of dilated and nondilated small bowel in the right lower quadrant on series 3, image 71. Vascular/Lymphatic: Aortic atherosclerosis. No enlarged abdominal or pelvic lymph nodes. Reproductive: Prostate is unremarkable.  Penile implant. Other: Negative for pelvic effusion. No free air. Interim removal of abdominal drainage catheter. Slightly thick rimmed fluid collection in the right gutter, measures about 6.6 cm craniocaudad by 1.2 cm thick on series 6, image 73 and series 3, image 52 and probably represents organizing fluid collection. Fluid and edema within the right greater than left flank regions without definite rim enhancement. Musculoskeletal: Degenerative changes. No acute osseous abnormality. IMPRESSION: 1. Interim removal of abdominal drainage catheter. Slightly thick rimmed fluid collection in the right gutter measuring up to 6.6 cm craniocaudad by 1.2 cm thick, probably representing organizing fluid collection/possible abscess. Interval development of low-density collection in the inferior right hepatic lobe measuring up to 3.6 cm, suspect for abscess as this was not present on the recent priors. 2. Increased fluid-filled small bowel dilatation in the lower abdomen and pelvis with decompressed small bowel distally, findings are suspect for bowel obstruction, possibly due to adhesive disease. 3. Aortic atherosclerosis. Aortic Atherosclerosis (ICD10-I70.0). Electronically Signed   By: Jasmine Pang M.D.   On: 10/22/2023 21:41   CT ABDOMEN PELVIS WO CONTRAST Result Date: 10/07/2023 CLINICAL DATA:  Abdominal pain postoperative EXAM: CT ABDOMEN AND PELVIS WITHOUT CONTRAST TECHNIQUE: Multidetector CT imaging of the abdomen and pelvis was performed following the standard protocol without IV contrast. RADIATION DOSE REDUCTION: This exam  was performed according to the departmental dose-optimization program which includes automated exposure control, adjustment of the mA and/or kV according to patient size and/or use of iterative reconstruction technique. COMPARISON:  09/30/2023 FINDINGS: Lower chest: Small bilateral pleural effusions with basilar atelectasis. Mild cardiac enlargement. Hepatobiliary: No focal liver abnormality is seen. No gallstones, gallbladder wall thickening, or biliary dilatation. Pancreas: Unremarkable. No pancreatic ductal dilatation or surrounding inflammatory changes. Spleen: Normal in size without focal abnormality. Adrenals/Urinary Tract: No adrenal gland nodules. Bilateral renal cysts. Largest in the left lower pole measures 5.9 cm in diameter. No change since prior study. No imaging follow-up is indicated. 1.3 cm diameter hyperdense cyst in the left kidney is unchanged since prior study and likely represents a hemorrhagic cyst. No imaging follow-up is indicated. No hydronephrosis or hydroureter. No renal, ureteral, or bladder stones. Bladder is decompressed with a Foley catheter. Stomach/Bowel: Stomach is filled with ingested material and contrast material. Proximal small bowel are mildly dilated with contrast fluid levels. Terminal ileum is decompressed. This may indicate early small-bowel obstruction or ileus. Scattered stool and liquid stool throughout the colon. No wall thickening or inflammatory changes. No colonic wall thickening. Interval resection of the appendix  with free fluid and stranding in the right lower quadrant and along the pericolic gutters. A surgical drain is in place. No loculated collections are identified. Vascular/Lymphatic: Aortic atherosclerosis. No enlarged abdominal or pelvic lymph nodes. Reproductive: Prostate is unremarkable. Penile prosthesis with right groin reservoir. Other: No free air in the abdomen. Abdominal wall musculature appears intact. Infiltration in the subcutaneous fat is  likely edema or postoperative change. Musculoskeletal: Degenerative changes in the spine. No acute bony abnormalities. IMPRESSION: 1. Postoperative appendectomy with residual fluid and stranding in the right lower quadrant. Right lower quadrant drainage catheter in place. No loculated collections. 2. Mildly dilated fluid-filled small bowel with distal decompression may represent early obstruction or ileus. 3. Aortic atherosclerosis. 4. Small bilateral pleural effusions with basilar atelectasis. Electronically Signed   By: Burman Nieves M.D.   On: 10/07/2023 17:52   DG Abd Portable 1V Result Date: 10/03/2023 CLINICAL DATA:  NG placement. EXAM: PORTABLE ABDOMEN - 1 VIEW COMPARISON:  Abdominal radiograph dated 10/02/2023. FINDINGS: Interval removal of the feeding tube and placement of an enteric tube with tip over the epigastric area likely in the distal stomach. IMPRESSION: Enteric tube with tip over the distal stomach. Electronically Signed   By: Elgie Collard M.D.   On: 10/03/2023 14:07   DG Abd Portable 1V Result Date: 10/02/2023 CLINICAL DATA:  Feeding tube placement. EXAM: PORTABLE ABDOMEN - 1 VIEW COMPARISON:  None Available. FINDINGS: Tip of the weighted enteric tube in the right upper quadrant in the region of the distal stomach. Few prominent air-filled loops of colon in the upper abdomen. IMPRESSION: Tip of the weighted enteric tube in the region of the distal stomach. Electronically Signed   By: Narda Rutherford M.D.   On: 10/02/2023 14:44   DG Chest Port 1 View Result Date: 10/02/2023 CLINICAL DATA:  Respiratory failure EXAM: PORTABLE CHEST 1 VIEW COMPARISON:  10/01/2023 FINDINGS: Endotracheal tube with tip at the clavicular heads. Biventricular pacer from the left in unremarkable position. Stable cardiomegaly. Small left pleural effusion and mild atelectasis. No pulmonary edema or air bronchogram. IMPRESSION: Stable including small left pleural effusion. Unremarkable endotracheal tube  position. Electronically Signed   By: Tiburcio Pea M.D.   On: 10/02/2023 08:53   DG Chest Port 1 View Result Date: 10/01/2023 CLINICAL DATA:  Respiratory failure EXAM: PORTABLE CHEST 1 VIEW COMPARISON:  03/12/2022 FINDINGS: Cardiac pacemaker. Interval placement of an endotracheal tube with tip measuring 5.2 cm above the carina. Shallow inspiration. Mild cardiac enlargement. No vascular congestion or edema. Small left pleural effusion with basilar atelectasis. No pneumothorax. Calcification of the aorta. IMPRESSION: Cardiac enlargement. Small left pleural effusion with basilar atelectasis. Endotracheal tube tip is above the carina. Electronically Signed   By: Burman Nieves M.D.   On: 10/01/2023 19:56   CT ABDOMEN PELVIS WO CONTRAST Result Date: 09/30/2023 CLINICAL DATA:  78 year old male with abdominal pain. EXAM: CT ABDOMEN AND PELVIS WITHOUT CONTRAST TECHNIQUE: Multidetector CT imaging of the abdomen and pelvis was performed following the standard protocol without IV contrast. RADIATION DOSE REDUCTION: This exam was performed according to the departmental dose-optimization program which includes automated exposure control, adjustment of the mA and/or kV according to patient size and/or use of iterative reconstruction technique. COMPARISON:  Chest CT 08/30/2023. FINDINGS: Lower chest: Stable cardiomegaly, partially visible cardiac pacemaker. No pericardial effusion. Patchy and confluent new bibasilar lung opacity most resembles atelectasis affecting the lingula and both costophrenic angles. No pleural effusion. Hepatobiliary: Negative noncontrast liver and gallbladder. Pancreas: Negative. Spleen: Negative. Adrenals/Urinary Tract:  Bilateral adrenal gland thickening compatible with hyperplasia. Nonobstructed kidneys with multiple bilateral renal cysts, the largest have simple fluid density (no follow-up imaging recommended). No nephrolithiasis. Extensive but symmetric and nonspecific bilateral pararenal  space fat stranding as seen on coronal image 60. Ureters are decompressed to the bladder. Decompressed and unremarkable urinary bladder. Stomach/Bowel: Decompressed and unremarkable rectum. Thick-walled sigmoid colon at the right pelvic inlet along a segment of about 7 cm (series 3, image 70). Wall thickening continues proximally but is less pronounced. Superimposed diverticulosis in the more proximal sigmoid colon (series 3, image 75). Retained stool in the descending colon which has a more normal appearance. Retained gas and stool in the transverse colon which appears negative. Ascending colon is decompressed. Right lower quadrant inflammation at the tip of the cecum (series 3, image 71) is surrounding and dependent adjacent to abnormal appendix. Appendix: Location: Medial to the tip of the cecum coronal image 77 Diameter: 14 mm Appendicolith: Positive (image 77) Mucosal hyper-enhancement: Noncontrast exam. Extraluminal gas: Negative. Periappendiceal collection: Regional inflammation and perhaps trace free fluid (series 3, image 68). Small bowel loops in the lower abdomen are widely irregular but decompressed. Retained fluid in the stomach. Decompressed duodenum. No free intraperitoneal air. No dilated small bowel. Small volume of free fluid in both gutters. Vascular/Lymphatic: Aortoiliac calcified atherosclerosis. Normal caliber abdominal aorta. Vascular patency is not evaluated in the absence of IV contrast. No lymphadenopathy identified. Reproductive: Penile implant. Other: No pelvis free fluid. Musculoskeletal: Advanced lower thoracic disc and endplate degeneration. No acute osseous abnormality identified. IMPRESSION: 1. Acute Appendicitis with appendicolith. No pneumoperitoneum to indicate perforation, and only a small volume of free fluid, but appearance of Widespread Peritonitis with extensive small bowel and multiple large bowel segments appearing actively inflamed. 2. No other acute finding; Lung base  atelectasis. Nonspecific bilateral pararenal space inflammation without obstructive uropathy. Aortic Atherosclerosis (ICD10-I70.0). Electronically Signed   By: Odessa Fleming M.D.   On: 09/30/2023 05:58     Subjective: No significant events overnight, he denies any complaints  Discharge Exam: Vitals:   10/25/23 0602 10/25/23 0810  BP: (!) 146/70 123/67  Pulse:    Resp:  12  Temp:  97.7 F (36.5 C)  SpO2:  92%   Vitals:   10/25/23 0013 10/25/23 0402 10/25/23 0602 10/25/23 0810  BP: (!) 127/57 139/68 (!) 146/70 123/67  Pulse: 77 74    Resp: 16 17  12   Temp: 97.6 F (36.4 C) 97.6 F (36.4 C)  97.7 F (36.5 C)  TempSrc: Oral Oral  Oral  SpO2: 96% 96%  92%    General: Pt is alert, awake, not in acute distress Cardiovascular: RRR, S1/S2 +, no rubs, no gallops Respiratory: CTA bilaterally, no wheezing, no rhonchi Abdominal: Soft, NT, ND, bowel sounds +, left lower quadrant incision still draining clear serous fluid Extremities: no edema, no cyanosis    The results of significant diagnostics from this hospitalization (including imaging, microbiology, ancillary and laboratory) are listed below for reference.     Microbiology: No results found for this or any previous visit (from the past 240 hours).   Labs: BNP (last 3 results) Recent Labs    08/08/23 1227 10/23/23 0430  BNP 151.8* 156.7*   Basic Metabolic Panel: Recent Labs  Lab 10/22/23 1730 10/23/23 0448 10/24/23 0452  NA 138 138 143  K 4.4 4.3 4.7  CL 107 109 110  CO2 21* 19* 19*  GLUCOSE 118* 91 94  BUN 35* 36* 36*  CREATININE 2.58* 2.83* 2.92*  CALCIUM 9.5 9.4 9.7   Liver Function Tests: Recent Labs  Lab 10/22/23 1730 10/24/23 0452  AST 28 26  ALT 29 25  ALKPHOS 83 75  BILITOT 0.5 0.5  PROT 5.9* 5.8*  ALBUMIN 2.3* 2.1*   Recent Labs  Lab 10/22/23 1730  LIPASE 38   No results for input(s): "AMMONIA" in the last 168 hours. CBC: Recent Labs  Lab 10/22/23 1730 10/23/23 0448 10/24/23 0452   WBC 13.0* 12.2* 11.9*  NEUTROABS 10.9*  --   --   HGB 11.7* 11.5* 11.7*  HCT 39.0 37.0* 38.4*  MCV 93.5 91.1 92.8  PLT 298 281 286   Cardiac Enzymes: No results for input(s): "CKTOTAL", "CKMB", "CKMBINDEX", "TROPONINI" in the last 168 hours. BNP: Invalid input(s): "POCBNP" CBG: Recent Labs  Lab 10/24/23 1758 10/24/23 2035 10/25/23 0016 10/25/23 0402 10/25/23 0813  GLUCAP 138* 110* 91 85 90   D-Dimer No results for input(s): "DDIMER" in the last 72 hours. Hgb A1c No results for input(s): "HGBA1C" in the last 72 hours. Lipid Profile No results for input(s): "CHOL", "HDL", "LDLCALC", "TRIG", "CHOLHDL", "LDLDIRECT" in the last 72 hours. Thyroid function studies No results for input(s): "TSH", "T4TOTAL", "T3FREE", "THYROIDAB" in the last 72 hours.  Invalid input(s): "FREET3" Anemia work up No results for input(s): "VITAMINB12", "FOLATE", "FERRITIN", "TIBC", "IRON", "RETICCTPCT" in the last 72 hours. Urinalysis    Component Value Date/Time   COLORURINE YELLOW 10/22/2023 1949   APPEARANCEUR CLEAR 10/22/2023 1949   LABSPEC 1.010 10/22/2023 1949   PHURINE 5.0 10/22/2023 1949   GLUCOSEU >=500 (A) 10/22/2023 1949   HGBUR NEGATIVE 10/22/2023 1949   BILIRUBINUR NEGATIVE 10/22/2023 1949   KETONESUR NEGATIVE 10/22/2023 1949   PROTEINUR NEGATIVE 10/22/2023 1949   NITRITE NEGATIVE 10/22/2023 1949   LEUKOCYTESUR NEGATIVE 10/22/2023 1949   Sepsis Labs Recent Labs  Lab 10/22/23 1730 10/23/23 0448 10/24/23 0452  WBC 13.0* 12.2* 11.9*   Microbiology No results found for this or any previous visit (from the past 240 hours).   Time coordinating discharge: Over 30 minutes  SIGNED:   Huey Bienenstock, MD  [facility] 10/25/2023, 10:36 AM Pager   If 7PM-7AM, please contact night-coverage [email or web address]

## 2023-11-06 ENCOUNTER — Other Ambulatory Visit: Payer: Self-pay | Admitting: Cardiovascular Disease

## 2023-11-08 ENCOUNTER — Emergency Department (HOSPITAL_BASED_OUTPATIENT_CLINIC_OR_DEPARTMENT_OTHER)

## 2023-11-08 ENCOUNTER — Encounter (HOSPITAL_BASED_OUTPATIENT_CLINIC_OR_DEPARTMENT_OTHER): Payer: Self-pay | Admitting: Emergency Medicine

## 2023-11-08 ENCOUNTER — Other Ambulatory Visit: Payer: Self-pay

## 2023-11-08 ENCOUNTER — Inpatient Hospital Stay (HOSPITAL_BASED_OUTPATIENT_CLINIC_OR_DEPARTMENT_OTHER)
Admission: EM | Admit: 2023-11-08 | Discharge: 2023-11-12 | DRG: 689 | Disposition: A | Discharge status: Home Health | Attending: Internal Medicine | Admitting: Internal Medicine

## 2023-11-08 ENCOUNTER — Encounter (HOSPITAL_BASED_OUTPATIENT_CLINIC_OR_DEPARTMENT_OTHER): Payer: Self-pay

## 2023-11-08 DIAGNOSIS — I130 Hypertensive heart and chronic kidney disease with heart failure and stage 1 through stage 4 chronic kidney disease, or unspecified chronic kidney disease: Secondary | ICD-10-CM | POA: Diagnosis present

## 2023-11-08 DIAGNOSIS — F419 Anxiety disorder, unspecified: Secondary | ICD-10-CM | POA: Diagnosis present

## 2023-11-08 DIAGNOSIS — Z955 Presence of coronary angioplasty implant and graft: Secondary | ICD-10-CM | POA: Diagnosis not present

## 2023-11-08 DIAGNOSIS — K651 Peritoneal abscess: Secondary | ICD-10-CM | POA: Diagnosis present

## 2023-11-08 DIAGNOSIS — Z885 Allergy status to narcotic agent status: Secondary | ICD-10-CM

## 2023-11-08 DIAGNOSIS — E876 Hypokalemia: Secondary | ICD-10-CM | POA: Diagnosis not present

## 2023-11-08 DIAGNOSIS — E1122 Type 2 diabetes mellitus with diabetic chronic kidney disease: Secondary | ICD-10-CM | POA: Diagnosis present

## 2023-11-08 DIAGNOSIS — I5022 Chronic systolic (congestive) heart failure: Secondary | ICD-10-CM | POA: Diagnosis present

## 2023-11-08 DIAGNOSIS — J9611 Chronic respiratory failure with hypoxia: Secondary | ICD-10-CM | POA: Diagnosis present

## 2023-11-08 DIAGNOSIS — B9620 Unspecified Escherichia coli [E. coli] as the cause of diseases classified elsewhere: Secondary | ICD-10-CM | POA: Diagnosis present

## 2023-11-08 DIAGNOSIS — Z7984 Long term (current) use of oral hypoglycemic drugs: Secondary | ICD-10-CM | POA: Diagnosis not present

## 2023-11-08 DIAGNOSIS — Z792 Long term (current) use of antibiotics: Secondary | ICD-10-CM

## 2023-11-08 DIAGNOSIS — Z6838 Body mass index (BMI) 38.0-38.9, adult: Secondary | ICD-10-CM | POA: Diagnosis not present

## 2023-11-08 DIAGNOSIS — Z720 Tobacco use: Secondary | ICD-10-CM | POA: Diagnosis present

## 2023-11-08 DIAGNOSIS — Z1612 Extended spectrum beta lactamase (ESBL) resistance: Secondary | ICD-10-CM | POA: Diagnosis present

## 2023-11-08 DIAGNOSIS — I10 Essential (primary) hypertension: Secondary | ICD-10-CM | POA: Diagnosis present

## 2023-11-08 DIAGNOSIS — N184 Chronic kidney disease, stage 4 (severe): Secondary | ICD-10-CM | POA: Diagnosis present

## 2023-11-08 DIAGNOSIS — I2510 Atherosclerotic heart disease of native coronary artery without angina pectoris: Secondary | ICD-10-CM | POA: Diagnosis present

## 2023-11-08 DIAGNOSIS — J449 Chronic obstructive pulmonary disease, unspecified: Secondary | ICD-10-CM

## 2023-11-08 DIAGNOSIS — E785 Hyperlipidemia, unspecified: Secondary | ICD-10-CM | POA: Diagnosis present

## 2023-11-08 DIAGNOSIS — N3000 Acute cystitis without hematuria: Secondary | ICD-10-CM

## 2023-11-08 DIAGNOSIS — E119 Type 2 diabetes mellitus without complications: Secondary | ICD-10-CM | POA: Diagnosis not present

## 2023-11-08 DIAGNOSIS — Z8249 Family history of ischemic heart disease and other diseases of the circulatory system: Secondary | ICD-10-CM | POA: Diagnosis not present

## 2023-11-08 DIAGNOSIS — Z95810 Presence of automatic (implantable) cardiac defibrillator: Secondary | ICD-10-CM | POA: Diagnosis not present

## 2023-11-08 DIAGNOSIS — Z7982 Long term (current) use of aspirin: Secondary | ICD-10-CM

## 2023-11-08 DIAGNOSIS — K219 Gastro-esophageal reflux disease without esophagitis: Secondary | ICD-10-CM | POA: Diagnosis present

## 2023-11-08 DIAGNOSIS — N390 Urinary tract infection, site not specified: Secondary | ICD-10-CM | POA: Diagnosis present

## 2023-11-08 DIAGNOSIS — F17210 Nicotine dependence, cigarettes, uncomplicated: Secondary | ICD-10-CM | POA: Diagnosis present

## 2023-11-08 DIAGNOSIS — F32A Depression, unspecified: Secondary | ICD-10-CM | POA: Diagnosis present

## 2023-11-08 DIAGNOSIS — I252 Old myocardial infarction: Secondary | ICD-10-CM

## 2023-11-08 DIAGNOSIS — Z8673 Personal history of transient ischemic attack (TIA), and cerebral infarction without residual deficits: Secondary | ICD-10-CM

## 2023-11-08 DIAGNOSIS — E669 Obesity, unspecified: Secondary | ICD-10-CM | POA: Diagnosis present

## 2023-11-08 DIAGNOSIS — L0291 Cutaneous abscess, unspecified: Principal | ICD-10-CM

## 2023-11-08 DIAGNOSIS — B9629 Other Escherichia coli [E. coli] as the cause of diseases classified elsewhere: Secondary | ICD-10-CM | POA: Diagnosis not present

## 2023-11-08 DIAGNOSIS — Z7901 Long term (current) use of anticoagulants: Secondary | ICD-10-CM | POA: Diagnosis not present

## 2023-11-08 DIAGNOSIS — A498 Other bacterial infections of unspecified site: Secondary | ICD-10-CM | POA: Diagnosis not present

## 2023-11-08 DIAGNOSIS — Z79899 Other long term (current) drug therapy: Secondary | ICD-10-CM

## 2023-11-08 LAB — COMPREHENSIVE METABOLIC PANEL
ALT: 34 U/L (ref 0–44)
AST: 44 U/L — ABNORMAL HIGH (ref 15–41)
Albumin: 3 g/dL — ABNORMAL LOW (ref 3.5–5.0)
Alkaline Phosphatase: 113 U/L (ref 38–126)
Anion gap: 9 (ref 5–15)
BUN: 26 mg/dL — ABNORMAL HIGH (ref 8–23)
CO2: 26 mmol/L (ref 22–32)
Calcium: 9.4 mg/dL (ref 8.9–10.3)
Chloride: 102 mmol/L (ref 98–111)
Creatinine, Ser: 2.13 mg/dL — ABNORMAL HIGH (ref 0.61–1.24)
GFR, Estimated: 31 mL/min — ABNORMAL LOW (ref >60)
Glucose, Bld: 113 mg/dL — ABNORMAL HIGH (ref 70–99)
Potassium: 3.5 mmol/L (ref 3.5–5.1)
Sodium: 137 mmol/L (ref 135–145)
Total Bilirubin: 0.4 mg/dL (ref 0.0–1.2)
Total Protein: 6.1 g/dL — ABNORMAL LOW (ref 6.5–8.1)

## 2023-11-08 LAB — CBC
HCT: 33.8 % — ABNORMAL LOW (ref 39.0–52.0)
Hemoglobin: 10.8 g/dL — ABNORMAL LOW (ref 13.0–17.0)
MCH: 28 pg (ref 26.0–34.0)
MCHC: 32 g/dL (ref 30.0–36.0)
MCV: 87.6 fL (ref 80.0–100.0)
Platelets: 341 10*3/uL (ref 150–400)
RBC: 3.86 MIL/uL — ABNORMAL LOW (ref 4.22–5.81)
RDW: 14.5 % (ref 11.5–15.5)
WBC: 21.9 10*3/uL — ABNORMAL HIGH (ref 4.0–10.5)
nRBC: 0 % (ref 0.0–0.2)

## 2023-11-08 LAB — URINALYSIS, ROUTINE W REFLEX MICROSCOPIC
Bilirubin Urine: NEGATIVE
Glucose, UA: 500 mg/dL — AB
Hgb urine dipstick: NEGATIVE
Ketones, ur: NEGATIVE mg/dL
Nitrite: NEGATIVE
Protein, ur: 30 mg/dL — AB
Specific Gravity, Urine: 1.015 (ref 1.005–1.030)
WBC, UA: >50 WBC/hpf (ref 0–5)
pH: 5.5 (ref 5.0–8.0)

## 2023-11-08 LAB — GLUCOSE, CAPILLARY: Glucose-Capillary: 103 mg/dL — ABNORMAL HIGH (ref 70–99)

## 2023-11-08 LAB — LACTIC ACID, PLASMA: Lactic Acid, Venous: 1.1 mmol/L (ref 0.5–1.9)

## 2023-11-08 MED ORDER — UMECLIDINIUM BROMIDE 62.5 MCG/ACT IN AEPB
1.0000 | INHALATION_SPRAY | Freq: Every day | RESPIRATORY_TRACT | Status: DC
  Administered 2023-11-09 – 2023-11-12 (×4): 1 via RESPIRATORY_TRACT
  Filled 2023-11-08: qty 7

## 2023-11-08 MED ORDER — SODIUM CHLORIDE 0.9 % IV SOLN
1.0000 g | INTRAVENOUS | Status: DC
  Administered 2023-11-09 – 2023-11-10 (×2): 1 g via INTRAVENOUS
  Filled 2023-11-08 (×2): qty 10

## 2023-11-08 MED ORDER — FUROSEMIDE 40 MG PO TABS
40.0000 mg | ORAL_TABLET | Freq: Every day | ORAL | Status: DC
  Administered 2023-11-09 – 2023-11-12 (×4): 40 mg via ORAL
  Filled 2023-11-08 (×4): qty 1

## 2023-11-08 MED ORDER — BUPROPION HCL ER (XL) 300 MG PO TB24
300.0000 mg | ORAL_TABLET | Freq: Every day | ORAL | Status: DC
  Administered 2023-11-09 – 2023-11-12 (×4): 300 mg via ORAL
  Filled 2023-11-08 (×4): qty 1

## 2023-11-08 MED ORDER — DOXYCYCLINE HYCLATE 100 MG PO TABS
100.0000 mg | ORAL_TABLET | Freq: Once | ORAL | Status: AC
Start: 2023-11-08 — End: 2023-11-08
  Administered 2023-11-08: 100 mg via ORAL
  Filled 2023-11-08: qty 1

## 2023-11-08 MED ORDER — HYDRALAZINE HCL 25 MG PO TABS
25.0000 mg | ORAL_TABLET | Freq: Three times a day (TID) | ORAL | Status: DC
  Administered 2023-11-08 – 2023-11-12 (×11): 25 mg via ORAL
  Filled 2023-11-08 (×11): qty 1

## 2023-11-08 MED ORDER — ENSURE ENLIVE PO LIQD
237.0000 mL | Freq: Two times a day (BID) | ORAL | Status: DC
Start: 2023-11-09 — End: 2023-11-12
  Administered 2023-11-09 – 2023-11-12 (×7): 237 mL via ORAL

## 2023-11-08 MED ORDER — COLCHICINE 0.6 MG PO TABS
0.6000 mg | ORAL_TABLET | Freq: Every day | ORAL | Status: DC
  Administered 2023-11-09 – 2023-11-12 (×4): 0.6 mg via ORAL
  Filled 2023-11-08 (×4): qty 1

## 2023-11-08 MED ORDER — ISOSORBIDE MONONITRATE ER 30 MG PO TB24
15.0000 mg | ORAL_TABLET | Freq: Every day | ORAL | Status: DC
  Administered 2023-11-09 – 2023-11-12 (×4): 15 mg via ORAL
  Filled 2023-11-08 (×4): qty 1

## 2023-11-08 MED ORDER — ALBUTEROL SULFATE (2.5 MG/3ML) 0.083% IN NEBU: 3.0000 mL | INHALATION_SOLUTION | Freq: Four times a day (QID) | RESPIRATORY_TRACT | Status: DC | PRN

## 2023-11-08 MED ORDER — DAPAGLIFLOZIN PROPANEDIOL 5 MG PO TABS
5.0000 mg | ORAL_TABLET | Freq: Every day | ORAL | Status: DC
  Administered 2023-11-09 – 2023-11-12 (×4): 5 mg via ORAL
  Filled 2023-11-08 (×4): qty 1

## 2023-11-08 MED ORDER — CLONAZEPAM 1 MG PO TABS: 1.0000 mg | ORAL_TABLET | Freq: Every evening | ORAL | Status: DC | PRN

## 2023-11-08 MED ORDER — ONDANSETRON HCL 4 MG PO TABS: 4.0000 mg | ORAL_TABLET | Freq: Four times a day (QID) | ORAL | Status: DC | PRN

## 2023-11-08 MED ORDER — ONDANSETRON HCL 4 MG/2ML IJ SOLN: 4.0000 mg | Freq: Four times a day (QID) | INTRAMUSCULAR | Status: DC | PRN

## 2023-11-08 MED ORDER — OXYCODONE-ACETAMINOPHEN 10-325 MG PO TABS: 1.0000 | ORAL_TABLET | Freq: Four times a day (QID) | ORAL | Status: DC | PRN

## 2023-11-08 MED ORDER — MORPHINE SULFATE (PF) 2 MG/ML IV SOLN: 2.0000 mg | INTRAVENOUS | Status: DC | PRN

## 2023-11-08 MED ORDER — SERTRALINE HCL 100 MG PO TABS
100.0000 mg | ORAL_TABLET | Freq: Every day | ORAL | Status: DC
  Administered 2023-11-09 – 2023-11-12 (×4): 100 mg via ORAL
  Filled 2023-11-08 (×4): qty 1

## 2023-11-08 MED ORDER — INSULIN ASPART 100 UNIT/ML IJ SOLN: 0.0000 [IU] | Freq: Every day | INTRAMUSCULAR | Status: DC

## 2023-11-08 MED ORDER — OXYCODONE HCL 5 MG PO TABS
5.0000 mg | ORAL_TABLET | Freq: Four times a day (QID) | ORAL | Status: DC | PRN
  Administered 2023-11-08 – 2023-11-11 (×5): 5 mg via ORAL
  Filled 2023-11-08 (×5): qty 1

## 2023-11-08 MED ORDER — LACTATED RINGERS IV SOLN: INTRAVENOUS | Status: AC

## 2023-11-08 MED ORDER — OMEGA-3-ACID ETHYL ESTERS 1 G PO CAPS
1.0000 g | ORAL_CAPSULE | Freq: Every day | ORAL | Status: DC
  Administered 2023-11-09 – 2023-11-12 (×4): 1 g via ORAL
  Filled 2023-11-08 (×4): qty 1

## 2023-11-08 MED ORDER — SODIUM CHLORIDE 0.9 % IV BOLUS
500.0000 mL | Freq: Once | INTRAVENOUS | Status: AC
  Administered 2023-11-08: 500 mL via INTRAVENOUS

## 2023-11-08 MED ORDER — CARVEDILOL 25 MG PO TABS
25.0000 mg | ORAL_TABLET | Freq: Two times a day (BID) | ORAL | Status: DC
  Administered 2023-11-08 – 2023-11-12 (×8): 25 mg via ORAL
  Filled 2023-11-08 (×8): qty 1

## 2023-11-08 MED ORDER — NITROGLYCERIN 0.4 MG SL SUBL: 0.4000 mg | SUBLINGUAL_TABLET | SUBLINGUAL | Status: DC | PRN

## 2023-11-08 MED ORDER — DOXYCYCLINE HYCLATE 100 MG PO TABS
100.0000 mg | ORAL_TABLET | Freq: Two times a day (BID) | ORAL | Status: DC
  Administered 2023-11-09 – 2023-11-12 (×7): 100 mg via ORAL
  Filled 2023-11-08 (×7): qty 1

## 2023-11-08 MED ORDER — OXYCODONE HCL 5 MG PO TABS
10.0000 mg | ORAL_TABLET | Freq: Once | ORAL | Status: AC
  Administered 2023-11-08: 10 mg via ORAL
  Filled 2023-11-08: qty 2

## 2023-11-08 MED ORDER — FLUTICASONE FUROATE-VILANTEROL 200-25 MCG/ACT IN AEPB
1.0000 | INHALATION_SPRAY | Freq: Every day | RESPIRATORY_TRACT | Status: DC
  Administered 2023-11-09 – 2023-11-12 (×4): 1 via RESPIRATORY_TRACT
  Filled 2023-11-08: qty 28

## 2023-11-08 MED ORDER — ATORVASTATIN CALCIUM 40 MG PO TABS
80.0000 mg | ORAL_TABLET | Freq: Every day | ORAL | Status: DC
  Administered 2023-11-09 – 2023-11-11 (×3): 80 mg via ORAL
  Filled 2023-11-08 (×3): qty 2

## 2023-11-08 MED ORDER — FENTANYL CITRATE PF 50 MCG/ML IJ SOSY: 25.0000 ug | PREFILLED_SYRINGE | Freq: Once | INTRAMUSCULAR | Status: DC

## 2023-11-08 MED ORDER — INSULIN ASPART 100 UNIT/ML IJ SOLN
0.0000 [IU] | Freq: Three times a day (TID) | INTRAMUSCULAR | Status: DC
  Administered 2023-11-09: 2 [IU] via SUBCUTANEOUS

## 2023-11-08 MED ORDER — SODIUM CHLORIDE 0.9 % IV SOLN
2.0000 g | Freq: Once | INTRAVENOUS | Status: AC
  Administered 2023-11-08: 2 g via INTRAVENOUS
  Filled 2023-11-08: qty 20

## 2023-11-08 MED ORDER — ASPIRIN 81 MG PO TBEC
81.0000 mg | DELAYED_RELEASE_TABLET | Freq: Every day | ORAL | Status: DC
  Administered 2023-11-09 – 2023-11-12 (×4): 81 mg via ORAL
  Filled 2023-11-08 (×4): qty 1

## 2023-11-08 MED ORDER — OXYCODONE-ACETAMINOPHEN 5-325 MG PO TABS
1.0000 | ORAL_TABLET | Freq: Four times a day (QID) | ORAL | Status: DC | PRN
  Administered 2023-11-08 – 2023-11-12 (×7): 1 via ORAL
  Filled 2023-11-08 (×8): qty 1

## 2023-11-08 MED ORDER — APIXABAN 5 MG PO TABS
5.0000 mg | ORAL_TABLET | Freq: Two times a day (BID) | ORAL | Status: DC
  Administered 2023-11-08 – 2023-11-12 (×8): 5 mg via ORAL
  Filled 2023-11-08 (×8): qty 1

## 2023-11-08 NOTE — ED Provider Notes (Signed)
 [place] [facility]MENT AT [facility] Provider Note   CSN: [id number] Arrival date & time: 11/08/23  1209     History  Chief Complaint  Patient presents with   Urinary Frequency    Bobby [name] is a 78 y.o. male with past medical history significant for hyperlipidemia, pulmonary hypertension, tobacco use, diabetes, recent hospitalization for appendicitis with complication of abscess who presents with concern for increased urination and frequency.  Diagnosed with UTI few days ago by PCP, has been taking Cipro, has taken 3 days of doses without improvement of symptoms.  Reports feeling better overall, abdominal pain still present.   Urinary Frequency       Home Medications Prior to Admission medications   Medication Sig Start Date End Date Taking? Authorizing Provider  albuterol (PROVENTIL HFA;VENTOLIN HFA) 108 (90 Base) MCG/ACT inhaler Inhale 2 puffs into the lungs every 6 (six) hours as needed for wheezing or shortness of breath. 08/21/17   Bhagat, Sharrell Ku, PA  apixaban (ELIQUIS) 5 MG TABS tablet Take 1 tablet (5 mg total) by mouth 2 (two) times daily. 03/15/22   Graciella Freer, PA-C  aspirin EC 81 MG tablet Take 1 tablet (81 mg total) by mouth daily. Swallow whole. Patient taking differently: Take 81 mg by mouth at bedtime. Swallow whole. 03/15/22   Graciella Freer, PA-C  atorvastatin (LIPITOR) 80 MG tablet Take 1 tablet (80 mg total) by mouth daily at 6 PM. 03/11/18   Lennette Bihari, MD  buPROPion (WELLBUTRIN XL) 300 MG 24 hr tablet Take 300 mg by mouth daily.    [provider]  carvedilol (COREG) 25 MG tablet TAKE ONE TABLET BY MOUTH TWICE DAILY 11/08/23   Lennette Bihari, MD  clonazePAM (KLONOPIN) 0.5 MG tablet Take 1 tablet (0.5 mg total) by mouth at bedtime as needed (sleep). Patient taking differently: Take 1 mg by mouth at bedtime as needed (sleep). 10/11/23   Deanna Artis, DO  Coenzyme Q10 (COQ10 PO) Take 1 capsule by mouth  daily.    [provider]  COLCRYS 0.6 MG tablet Take 0.6 mg by mouth daily. 03/21/17   [provider]  dapagliflozin propanediol (FARXIGA) 5 MG TABS tablet Take 5 mg by mouth daily. 09/09/23   [provider]  feeding supplement (ENSURE ENLIVE / ENSURE PLUS) LIQD Take 237 mLs by mouth 2 (two) times daily between meals. 10/11/23   Deanna Artis, DO  furosemide (LASIX) 20 MG tablet Take 1 tablet (20 mg total) by mouth daily. Patient taking differently: Take 40 mg by mouth daily. 08/08/23   Sabharwal, Aditya, DO  hydrALAZINE (APRESOLINE) 50 MG tablet Take 0.5 tablets (25 mg total) by mouth every 8 (eight) hours. 10/25/23   Elgergawy, Leana Roe, MD  isosorbide mononitrate (IMDUR) 30 MG 24 hr tablet Take 0.5 tablets (15 mg total) by mouth daily. 10/25/23   Elgergawy, Leana Roe, MD  nitroGLYCERIN (NITROSTAT) 0.4 MG SL tablet DISSOLVE 1 TABLET UNDER THE TONGUE EVERY 5 MINUTES AS NEEDED FOR CHEST PAIN. DO NOT EXCEED A TOTAL OF 3 DOSES IN 15 MINUTES. Patient not taking: Reported on 10/22/2023 07/11/23   Lennette Bihari, MD  Omega-3 Fatty Acids (FISH OIL PO) Take 1 tablet by mouth daily.    [provider]  oxyCODONE-acetaminophen (PERCOCET) 10-325 MG tablet Take 1 tablet by mouth every 6 (six) hours as needed for pain. 10/11/23   Deanna Artis, DO  pantoprazole (PROTONIX) 40 MG tablet Take 1 tablet (40 mg total)  by mouth at bedtime. Patient not taking: Reported on 10/22/2023 10/11/23   Deanna Artis, DO  sertraline (ZOLOFT) 100 MG tablet Take 100 mg by mouth daily.    [provider]  Dwyane Luo 200-62.5-25 MCG/ACT AEPB Inhale 1 puff into the lungs daily. 04/02/22   [provider]      Allergies    Hydrocodone    Review of Systems   Review of Systems  Genitourinary:  Positive for frequency.  All other systems reviewed and are negative.   Physical Exam Updated Vital Signs BP (!) 140/90   Pulse 81   Temp 98.4 F (36.9 C) (Oral)   Resp 17    Ht 5\' 9"  (1.753 m)   Wt 119 kg   SpO2 95%   BMI 38.74 kg/m  Physical Exam Vitals and nursing note reviewed.  Constitutional:      General: He is not in acute distress.    Appearance: Normal appearance.  HENT:     Head: Normocephalic and atraumatic.  Eyes:     General:        Right eye: No discharge.        Left eye: No discharge.  Cardiovascular:     Rate and Rhythm: Normal rate and regular rhythm.     Heart sounds: No murmur heard.    No friction rub. No gallop.  Pulmonary:     Effort: Pulmonary effort is normal.     Breath sounds: Normal breath sounds.  Abdominal:     General: Bowel sounds are normal.     Palpations: Abdomen is soft.     Comments: Tenderness to palpation throughout lower abdomen, most focally in right lower quadrant, no distention, no guarding.  Postsurgical changes, bruising, and appropriately healing surgical scars visualized.  Skin:    General: Skin is warm and dry.     Capillary Refill: Capillary refill takes less than 2 seconds.  Neurological:     Mental Status: He is alert and oriented to person, place, and time.  Psychiatric:        Mood and Affect: Mood normal.        Behavior: Behavior normal.     ED Results / Procedures / Treatments   Labs (all labs ordered are listed, but only abnormal results are displayed) Labs Reviewed  URINALYSIS, ROUTINE W REFLEX MICROSCOPIC - Abnormal; Notable for the following components:      Result Value   APPearance HAZY (*)    Glucose, UA 500 (*)    Protein, ur 30 (*)    Leukocytes,Ua LARGE (*)    Bacteria, UA RARE (*)    All other components within normal limits  CBC - Abnormal; Notable for the following components:   WBC 21.9 (*)    RBC 3.86 (*)    Hemoglobin 10.8 (*)    HCT 33.8 (*)    All other components within normal limits  COMPREHENSIVE METABOLIC PANEL - Abnormal; Notable for the following components:   Glucose, Bld 113 (*)    BUN 26 (*)    Creatinine, Ser 2.13 (*)    Total Protein 6.1 (*)     Albumin 3.0 (*)    AST 44 (*)    GFR, Estimated 31 (*)    All other components within normal limits  CULTURE, BLOOD (ROUTINE X 2)  CULTURE, BLOOD (ROUTINE X 2)  URINE CULTURE  LACTIC ACID, PLASMA    EKG None  Radiology CT ABDOMEN PELVIS WO CONTRAST Result Date: 11/08/2023 CLINICAL  DATA:  Laparoscopic appendectomy 10/01/2023 complicated by postoperative fluid collections requiring percutaneous drainage, now with appendicitis complication suspected . EXAM: CT ABDOMEN AND PELVIS WITHOUT CONTRAST TECHNIQUE: Multidetector CT imaging of the abdomen and pelvis was performed following the standard protocol without IV contrast. RADIATION DOSE REDUCTION: This exam was performed according to the departmental dose-optimization program which includes automated exposure control, adjustment of the mA and/or kV according to patient size and/or use of iterative reconstruction technique. COMPARISON:  10/22/2023 CT abdomen/pelvis FINDINGS: Lower chest: No significant pulmonary nodules or acute consolidative airspace disease. ICD leads seen terminating in the right atrium, right ventricular apex and coronary sinus. Hepatobiliary: Mild diffuse hepatic steatosis. Small 2.3 x 1.9 cm fluid collection along the inferior right liver capsule (series 2/image 36), it decreased from 3.6 x 2.7 cm on 10/22/2023 CT. No new liver lesions or new perihepatic collections. Normal gallbladder with no radiopaque cholelithiasis. No biliary ductal dilatation. Pancreas: Normal, with no mass or duct dilation. Spleen: Normal size. No mass. Adrenals/Urinary Tract: Stable adrenals without discrete adrenal nodules. No hydronephrosis. No renal stones. Simple bilateral renal cysts, largest 6.5 cm in the anterior lower left kidney, for which no follow-up imaging is recommended. Scattered tiny hyperdense left renal cortical lesions, largest 1.3 cm in the anterior upper left kidney on series 2/image 27, unchanged, for which no follow-up imaging is  recommended. Normal caliber ureters. No ureteral stones. Completely collapsed bladder, limiting assessment, with no definite bladder abnormality. Stomach/Bowel: Normal non-distended stomach. A few mildly dilated small bowel loops in the anterior abdomen up to the 4.0 cm diameter, without discrete small bowel caliber transition, decreased from 5.4 cm diameter on 10/22/2023 CT, favor mild improving adynamic ileus. No small bowel wall thickening. Status post appendectomy. No discrete fluid collections in the appendectomy bed. Mild left colonic diverticulosis with no large bowel wall thickening or acute pericolonic fat stranding. Vascular/Lymphatic: Atherosclerotic nonaneurysmal abdominal aorta. No pathologically enlarged lymph nodes in the abdomen or pelvis. Reproductive: Top-normal size prostate. Intact appearing partially visualized penile prosthesis. Other: No pneumoperitoneum. Small curvilinear fluid tract in the right pericolic gutter measures 1.5 x 1.0 x 8.5 cm (series 2/image 48), slightly decreased from 1.9 x 1.2 x 9.1 cm using similar measurement technique. No new fluid collections. Scattered regions of mild fat stranding and ill-defined fluid throughout the subcutaneous bilateral abdominal wall without discrete measurable/drainable fluid collections. Musculoskeletal: No aggressive appearing focal osseous lesions. Mild thoracolumbar spondylosis. IMPRESSION: 1. Small 2.3 x 1.9 cm fluid collection along the inferior right liver capsule, decreased from 3.6 x 2.7 cm on 10/22/2023 CT. 2. Small curvilinear fluid tract in the right paracolic gutter, slightly decreased from 10/22/2023 CT. 3. No new or enlarging discrete drainable fluid collections. 4. Scattered regions of mild fat stranding and ill-defined fluid throughout the subcutaneous bilateral abdominal wall without discrete measurable/drainable fluid collections, compatible with postoperative edema and/or cellulitis. 5. Persistent mildly dilated anterior  abdominal small bowel loops, improved in the interval, favor improving mild adynamic ileus. 6. Mild diffuse hepatic steatosis. 7. Mild left colonic diverticulosis. 8.  Aortic Atherosclerosis (ICD10-I70.0). Electronically Signed   By: Delbert Phenix M.D.   On: 11/08/2023 17:23    Procedures Procedures    Medications Ordered in ED Medications  cefTRIAXone (ROCEPHIN) 2 g in sodium chloride 0.9 % 100 mL IVPB (0 g Intravenous Stopped 11/08/23 1450)  sodium chloride 0.9 % bolus 500 mL (0 mLs Intravenous Stopped 11/08/23 1507)  doxycycline (VIBRA-TABS) tablet 100 mg (100 mg Oral Given 11/08/23 1749)    ED Course/  Medical Decision Making/ A&P                                 Medical Decision Making Amount and/or Complexity of Data Reviewed Labs: ordered. Radiology: ordered.   This patient is a 78 y.o. male  who presents to the ED for concern of abdominal pain, dysuria, urinary frequency.   Differential diagnoses prior to evaluation: The emergent differential diagnosis includes, but is not limited to,  The causes of generalized abdominal pain include but are not limited to AAA, mesenteric ischemia, appendicitis, diverticulitis, DKA, gastritis, gastroenteritis, AMI, nephrolithiasis, pancreatitis, peritonitis, adrenal insufficiency,lead poisoning, iron toxicity, intestinal ischemia, constipation, UTI,SBO/LBO, splenic rupture, biliary disease, IBD, IBS, PUD, or hepatitis --in context of his recent medical history high clinical suspicion for complication related to his recent appendicitis with abscess, considered UTI, pyelonephritis, or other kidney infection, he could be having an obstructive uropathy related to his appendicitis abscess which is causing his urinary discomfort.. This is not an exhaustive differential.   Past Medical History / Co-morbidities / Social History: Diabetes, pulmonary hypertension, previous stroke, hypertension, hyperlipidemia, cardiac stents, CHF, appendicitis with  abscess  Additional history: Chart reviewed. Pertinent results include: Extensively reviewed lab work, imaging from recent emergency department evaluations, hospital admissions for appendicitis with abscess  Physical Exam: Physical exam performed. The pertinent findings include: Somewhat soft blood pressure on arrival, 116/57, improved on recheck after some fluids, no tachycardia, no fever, stable oxygen saturation on room air, normal respiratory rate, focal tenderness to palpation of the abdomen, recent surgical changes from appendectomy noted, surgical scars appear appropriately healing.  Lab Tests/Imaging studies: I personally interpreted labs/imaging and the pertinent results include: CBC notable for significant leukocytosis, white blood cells 21.9, mild anemia, hemoglobin 10.8, white count is significant worsening from recent hospitalization suggesting inadequately treated infection or new infectious source.  CMP notable for elevated BUN, creatinine although fairly stable compared to patient's baseline CKD.  UA is notable for large leukocytes, greater than 50 white blood cells and rare bacteria, initial lactic acid within normal range.  Will send blood cultures due to high risk for sepsis given his recent appendicitis and abscess.  I dependently interpreted CT abdomen pelvis which shows stable appearing fluid collection in right paracolic gutter and liver space, there is some swelling and inflammation of the abdomen wall suspicious for edema versus infection in context of postsurgery, I agree with the radiologist interpretation.   Medications: I ordered medication including Rocephin, doxycycline for suspected abdomen wall infection plus inadequately treated urinary tract infection.  I have reviewed the patients home medicines and have made adjustments as needed.   Consults: I spoke with the hospitalist, Dr. Mikeal Hawthorne who after discussion of patient's lab work, imaging findings agrees to admission for  acute cystitis with failure of outpatient antibiotics as well as abdomen wall infection.  Requests we reach out to general surgery to see if they would like to see the patient in follow-up.  Initial surgery performed by Dr. Azucena Cecil.  Disposition: After consideration of the diagnostic results and the patients response to treatment, I feel that patient would benefit from mission for multiple diagnoses as discussed above, primary admission diagnosis is for acute cystitis with failure of outpatient antibiotic therapy, concern for developing sepsis.   Final Clinical Impression(s) / ED Diagnoses Final diagnoses:  None    Rx / DC Orders ED Discharge Orders     None  Olene Floss, PA-C 11/08/23 Cain Sieve, MD 11/08/23 [phone]

## 2023-11-08 NOTE — H&P (Signed)
 History and Physical    Patient: Bobby [name] ZOX:[id number] DOB: 1946/04/26 DOA: 11/08/2023 DOS: the patient was seen and examined on 11/08/2023 PCP: Dois Davenport, MD  Patient coming from: Home  Chief Complaint:  Chief Complaint  Patient presents with   Urinary Frequency   HPI: RAFEL [name] is a 78 y.o. male with medical history significant of type 2 diabetes, which is diet controlled, COPD, chronic hypoxic respiratory failure on 2 L oxygen, coronary artery disease, CVA, anterior wall infarction with PCI performed, patient having left ventricular thrombus and has been on Eliquis since then, patient also has chronic heart failure with reduced ejection fraction with EF 35 to 40% chronic kidney disease stage IV, hyperlipidemia, hypertension, anxiety depression, GERD, has had multiple admission to the hospital initially after peritonitis secondary to an acute appendicitis and septic shock.  Patient had laparoscopic appendectomy and Blake drain placement back in January 28.  Was discharged but came back with intra-abdominal abscess.  Was also admitted on February 19 and discharged.  Patient came back to ER at the [facility] tonight complaining of urinary frequency, also weakness.  Patient has been on ciprofloxacin at home.  Was being treated for UTI but no improvement.  Is being admitted to the hospital with UTI that has failed outpatient treatment.  Review of Systems: As mentioned in the history of present illness. All other systems reviewed and are negative. Past Medical History:  Diagnosis Date   DM (diabetes mellitus), type 2 new diagnosis 08/05/2012   diet control   ED (erectile dysfunction)    surgery planned   Hyperlipidemia 08/05/2012   Hypertensive heart disease    Panic attack    Pulmonary HTN (HCC)    echo 08/05/12, EF 55-60%, PA pressure 45mm   S/P coronary artery stent placement, to RCA Promus DES 08/05/2012   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   Tobacco abuse     Past Surgical History:  Procedure Laterality Date   BIV ICD INSERTION CRT-D N/A 03/11/2022   Procedure: BIV ICD INSERTION CRT-D;  Surgeon: Regan Lemming, MD;  Location: [facility] INVASIVE CV LAB;  Service: Cardiovascular;  Laterality: N/A;   CARDIAC CATHETERIZATION  08/04/12   PCI to RCA with DES   CORONARY STENT INTERVENTION N/A 08/12/2017   Procedure: CORONARY STENT INTERVENTION;  Surgeon: Swaziland, Peter M, MD;  Location: [facility] INVASIVE CV LAB;  Service: Cardiovascular;  Laterality: N/A;   CORONARY/GRAFT ACUTE MI REVASCULARIZATION N/A 08/09/2017   Procedure: Coronary/Graft Acute MI Revascularization;  Surgeon: Lyn Records, MD;  Location: [facility] INVASIVE CV LAB;  Service: Cardiovascular;  Laterality: N/A;   LAPAROSCOPIC APPENDECTOMY N/A 10/01/2023   Procedure: LAPAROSCOPIC APPENDECTOMY;  Surgeon: Lysle Rubens, MD;  Location: [facility] OR;  Service: General;  Laterality: N/A;   LEFT HEART CATH AND CORONARY ANGIOGRAPHY N/A 08/09/2017   Procedure: LEFT HEART CATH AND CORONARY ANGIOGRAPHY;  Surgeon: Lyn Records, MD;  Location: [facility] INVASIVE CV LAB;  Service: Cardiovascular;  Laterality: N/A;   LEFT HEART CATHETERIZATION WITH CORONARY ANGIOGRAM N/A 08/04/2012   Procedure: LEFT HEART CATHETERIZATION WITH CORONARY ANGIOGRAM;  Surgeon: Lennette Bihari, MD;  Location: [facility] CATH LAB;  Service: Cardiovascular;  Laterality: N/A;   LEG SURGERY Left    rod placed for fracture repair   PENILE PROSTHESIS IMPLANT N/A 06/03/2014   Procedure: IMPLANT PENILE PROTHESIS INFLATABLE;  Surgeon: Kathi Ludwig, MD;  Location: [facility] ORS;  Service: Urology;  Laterality: N/A;  with penile block--0.5% marcaine plain   PERCUTANEOUS  CORONARY STENT INTERVENTION (PCI-S) Right 08/04/2012   Procedure: PERCUTANEOUS CORONARY STENT INTERVENTION (PCI-S);  Surgeon: Lennette Bihari, MD;  Location: [facility] CATH LAB;  Service: Cardiovascular;  Laterality: Right;   TEMPORARY PACEMAKER N/A 03/08/2022   Procedure: TEMPORARY PACEMAKER;  Surgeon: Lyn Records, MD;  Location: [facility] INVASIVE CV LAB;  Service: Cardiovascular;  Laterality: N/A;   THUMB ARTHROSCOPY Left    thumb joint replaced   Social History:  reports that he has been smoking cigarettes. He has a 15 pack-year smoking history. He has never used smokeless tobacco. He reports that he does not currently use alcohol. He reports that he does not use drugs.  Allergies  Allergen Reactions   Hydrocodone Itching    Family History  Problem Relation Age of Onset   Kidney disease Father        kidney disease and heart disease   Heart disease Maternal Grandmother    Stomach cancer Maternal Grandfather    Heart attack Paternal Grandmother     Prior to Admission medications   Medication Sig Start Date End Date Taking? Authorizing Provider  albuterol (PROVENTIL HFA;VENTOLIN HFA) 108 (90 Base) MCG/ACT inhaler Inhale 2 puffs into the lungs every 6 (six) hours as needed for wheezing or shortness of breath. 08/21/17   Bhagat, Sharrell Ku, PA  apixaban (ELIQUIS) 5 MG TABS tablet Take 1 tablet (5 mg total) by mouth 2 (two) times daily. 03/15/22   Graciella Freer, PA-C  aspirin EC 81 MG tablet Take 1 tablet (81 mg total) by mouth daily. Swallow whole. Patient taking differently: Take 81 mg by mouth at bedtime. Swallow whole. 03/15/22   Graciella Freer, PA-C  atorvastatin (LIPITOR) 80 MG tablet Take 1 tablet (80 mg total) by mouth daily at 6 PM. 03/11/18   Lennette Bihari, MD  buPROPion (WELLBUTRIN XL) 300 MG 24 hr tablet Take 300 mg by mouth daily.    [provider]  carvedilol (COREG) 25 MG tablet TAKE ONE TABLET BY MOUTH TWICE DAILY 11/08/23   Lennette Bihari, MD  clonazePAM (KLONOPIN) 0.5 MG tablet Take 1 tablet (0.5 mg total) by mouth at bedtime as needed (sleep). Patient taking differently: Take 1 mg by mouth at bedtime as needed (sleep). 10/11/23   Deanna Artis, DO  Coenzyme Q10 (COQ10 PO) Take 1 capsule by mouth daily.    [provider]  COLCRYS 0.6 MG tablet  Take 0.6 mg by mouth daily. 03/21/17   [provider]  dapagliflozin propanediol (FARXIGA) 5 MG TABS tablet Take 5 mg by mouth daily. 09/09/23   [provider]  feeding supplement (ENSURE ENLIVE / ENSURE PLUS) LIQD Take 237 mLs by mouth 2 (two) times daily between meals. 10/11/23   Deanna Artis, DO  furosemide (LASIX) 20 MG tablet Take 1 tablet (20 mg total) by mouth daily. Patient taking differently: Take 40 mg by mouth daily. 08/08/23   Sabharwal, Aditya, DO  hydrALAZINE (APRESOLINE) 50 MG tablet Take 0.5 tablets (25 mg total) by mouth every 8 (eight) hours. 10/25/23   Elgergawy, Leana Roe, MD  isosorbide mononitrate (IMDUR) 30 MG 24 hr tablet Take 0.5 tablets (15 mg total) by mouth daily. 10/25/23   Elgergawy, Leana Roe, MD  nitroGLYCERIN (NITROSTAT) 0.4 MG SL tablet DISSOLVE 1 TABLET UNDER THE TONGUE EVERY 5 MINUTES AS NEEDED FOR CHEST PAIN. DO NOT EXCEED A TOTAL OF 3 DOSES IN 15 MINUTES. Patient not taking: Reported on 10/22/2023 07/11/23   Lennette Bihari, MD  Omega-3  Fatty Acids (FISH OIL PO) Take 1 tablet by mouth daily.    [provider]  oxyCODONE-acetaminophen (PERCOCET) 10-325 MG tablet Take 1 tablet by mouth every 6 (six) hours as needed for pain. 10/11/23   Deanna Artis, DO  pantoprazole (PROTONIX) 40 MG tablet Take 1 tablet (40 mg total) by mouth at bedtime. Patient not taking: Reported on 10/22/2023 10/11/23   Deanna Artis, DO  sertraline (ZOLOFT) 100 MG tablet Take 100 mg by mouth daily.    [provider]  Dwyane Luo 200-62.5-25 MCG/ACT AEPB Inhale 1 puff into the lungs daily. 04/02/22   [provider]    Physical Exam: Vitals:   11/08/23 2136 11/08/23 2137 11/08/23 2139 11/08/23 2242  BP: (!) 168/80   130/74  Pulse: 84 84  84  Resp: 18 18  (!) 22  Temp:   97.6 F (36.4 C) 98.2 F (36.8 C)  TempSrc:   Oral   SpO2: 94% 92%  98%  Weight:      Height:       Constitutional: Acutely ill looking, no distress NAD, calm,  comfortable Eyes: PERRL, lids and conjunctivae normal ENMT: Mucous membranes are moist. Posterior pharynx clear of any exudate or lesions.Normal dentition.  Neck: normal, supple, no masses, no thyromegaly Respiratory: clear to auscultation bilaterally, no wheezing, no crackles. Normal respiratory effort. No accessory muscle use.  Cardiovascular: Regular rate and rhythm, no murmurs / rubs / gallops. No extremity edema. 2+ pedal pulses. No carotid bruits.  Abdomen: Anterior abdominal wall slightly tender on right, no masses palpated. No hepatosplenomegaly. Bowel sounds positive.  Musculoskeletal: Good range of motion, no joint swelling or tenderness, Skin: no rashes, lesions, ulcers. No induration Neurologic: CN 2-12 grossly intact. Sensation intact, DTR normal. Strength 5/5 in all 4.  Psychiatric: Normal judgment and insight. Alert and oriented x 3. Normal mood  Data Reviewed:  Afebrile temperature 98.1, blood pressure 168/80, white count 21.9 hemoglobin 10.8.  BUN 26 creatinine 2.13 and glucose 113 urinalysis showed large leukocytes WBC more than 50 on arrival to blood cultures obtained CT abdomen pelvis shows small 2 x 3 x 1.9 cm fluid collection along the inferior right liver capsule which has decreased from previous.  There is small fluid tract in the right paracolic gutter there is persistent mildly dilated anterior abdominal wall bowel loops.  Assessment and Plan:  #1 UTI: Has failed outpatient treatment.  Urine cultures obtained blood cultures obtained.  Initiate IV Rocephin empirically.  #2 intra-abdominal abscess: Appears to be stable.  Continue with doxycycline and Rocephin.  Afebrile.  Patient still has a white count of 21,000 which could be from the UTI.  Surgery to follow patient.  #3 diet-controlled diabetes: Continue with management.  Consider sliding scale insulin.  #4 COPD with chronic respiratory failure: No acute exacerbation.  Continue home regimen  #5 essential  hypertension: Continue home regimen.  #6 heart failure with reduced ejection fraction: Last EF was 35 to 40%.  Patient however appears to be dehydrated in the morning.  Gentle fluids after a liter.  #7 chronic kidney disease stage IV: Renal functions can better.  Continue to monitor.  #8 coronary artery disease: Stable.  #9 morbid obesity: Continue with dietary counseling.    Advance Care Planning:   Code Status: Full Code   Consults: Dr. Derrell Lolling, general surgery  Family Communication: No family at bedside  Severity of Illness: The appropriate patient status for this patient is INPATIENT. Inpatient status is judged to be reasonable  and necessary in order to provide the required intensity of service to ensure the patient's safety. The patient's presenting symptoms, physical exam findings, and initial radiographic and laboratory data in the context of their chronic comorbidities is felt to place them at high risk for further clinical deterioration. Furthermore, it is not anticipated that the patient will be medically stable for discharge from the hospital within 2 midnights of admission.   * I certify that at the point of admission it is my clinical judgment that the patient will require inpatient hospital care spanning beyond 2 midnights from the point of admission due to high intensity of service, high risk for further deterioration and high frequency of surveillance required.*  AuthorLonia Blood, MD 11/08/2023 11:08 PM  For on call review [email or web address].

## 2023-11-08 NOTE — ED Notes (Signed)
 BC X2 drawn before starting abx.

## 2023-11-08 NOTE — ED Triage Notes (Signed)
 States increased urination and frequency. Recently hospitalized for appendicitis.    Hx of UTI, stage 4 kidney disease.

## 2023-11-08 NOTE — Plan of Care (Signed)

## 2023-11-09 DIAGNOSIS — N3000 Acute cystitis without hematuria: Secondary | ICD-10-CM | POA: Diagnosis not present

## 2023-11-09 LAB — GLUCOSE, CAPILLARY
Glucose-Capillary: 91 mg/dL (ref 70–99)
Glucose-Capillary: 83 mg/dL (ref 70–99)
Glucose-Capillary: 132 mg/dL — ABNORMAL HIGH (ref 70–99)
Glucose-Capillary: 92 mg/dL (ref 70–99)

## 2023-11-09 LAB — COMPREHENSIVE METABOLIC PANEL
ALT: 44 U/L (ref 0–44)
AST: 56 U/L — ABNORMAL HIGH (ref 15–41)
Albumin: 2 g/dL — ABNORMAL LOW (ref 3.5–5.0)
Alkaline Phosphatase: 109 U/L (ref 38–126)
Anion gap: 8 (ref 5–15)
BUN: 29 mg/dL — ABNORMAL HIGH (ref 8–23)
CO2: 26 mmol/L (ref 22–32)
Calcium: 9.5 mg/dL (ref 8.9–10.3)
Chloride: 107 mmol/L (ref 98–111)
Creatinine, Ser: 2.13 mg/dL — ABNORMAL HIGH (ref 0.61–1.24)
GFR, Estimated: 31 mL/min — ABNORMAL LOW (ref >60)
Glucose, Bld: 109 mg/dL — ABNORMAL HIGH (ref 70–99)
Potassium: 3.2 mmol/L — ABNORMAL LOW (ref 3.5–5.1)
Sodium: 141 mmol/L (ref 135–145)
Total Bilirubin: 0.4 mg/dL (ref 0.0–1.2)
Total Protein: 5.8 g/dL — ABNORMAL LOW (ref 6.5–8.1)

## 2023-11-09 LAB — CBC
HCT: 33.9 % — ABNORMAL LOW (ref 39.0–52.0)
Hemoglobin: 10.1 g/dL — ABNORMAL LOW (ref 13.0–17.0)
MCH: 27.4 pg (ref 26.0–34.0)
MCHC: 29.8 g/dL — ABNORMAL LOW (ref 30.0–36.0)
MCV: 91.9 fL (ref 80.0–100.0)
Platelets: 325 10*3/uL (ref 150–400)
RBC: 3.69 MIL/uL — ABNORMAL LOW (ref 4.22–5.81)
RDW: 14.6 % (ref 11.5–15.5)
WBC: 15.8 10*3/uL — ABNORMAL HIGH (ref 4.0–10.5)
nRBC: 0 % (ref 0.0–0.2)

## 2023-11-09 MED ORDER — POTASSIUM CHLORIDE 10 MEQ/100ML IV SOLN
10.0000 meq | INTRAVENOUS | Status: AC
  Administered 2023-11-09 (×2): 10 meq via INTRAVENOUS
  Filled 2023-11-09 (×2): qty 100

## 2023-11-09 NOTE — Progress Notes (Signed)
  Progress Note   Patient: Bobby [name] ZOX:[id number] DOB: 1946-06-30 DOA: 11/08/2023     1 DOS: the patient was seen and examined on 11/09/2023   Brief hospital course: 78 y.o. male with medical history significant of type 2 diabetes, which is diet controlled, COPD, chronic hypoxic respiratory failure on 2 L oxygen, coronary artery disease, CVA, anterior wall infarction with PCI performed, patient having left ventricular thrombus and has been on Eliquis since then, patient also has chronic heart failure with reduced ejection fraction with EF 35 to 40% chronic kidney disease stage IV, hyperlipidemia, hypertension, anxiety depression, GERD, has had multiple admission to the hospital initially after peritonitis secondary to an acute appendicitis and septic shock presenting with UTI that failed outpatient management.  Assessment and Plan:  Urinary tract infection - No improvement despite outpatient Cipro.  Initiated on IV Rocephin empirically here.  UA noting leukocytes and bacteria.  Will await urine cultures.  Recheck CBC in AM.  Chronic intra-abdominal abscess - CT scan noting stable appearance.  Continues on doxycycline and ceftriaxone.  Leukocytosis profound on presentation, showing improvement.  General surgery consulted.  Diabetes mellitus - Insulin sliding scale on board.  COPD with chronic hypoxic respiratory failure - Continues on 2 L.  Does not appear to be in acute exacerbation.  Nebulizers on board.  HFrEF/CAD - Does not appear to be in acute exacerbation.  Will monitor closely given IV fluid resuscitation.  Resume cardiac regimen.  CKD 4 - Creatinine stable.  Hypokalemia - Replenishment initiated.  Will recheck CBC and mag in AM.         Subjective: Patient resting comfortably this morning.  Seems a bit confused but answers questions appropriately.  Denies any fever, chills, chest pain, nausea, vomiting, abdominal pain.  Did admit to taking antibiotics prior to coming in.   Does not complain of any urinary complaints at this time.  Physical Exam: Vitals:   11/08/23 2139 11/08/23 2242 11/09/23 0238 11/09/23 0632  BP:  130/74 114/61 (!) 147/74  Pulse:  84 75 72  Resp:  (!) 22 20 20   Temp: 97.6 F (36.4 C) 98.2 F (36.8 C) 98 F (36.7 C) (!) 97.4 F (36.3 C)  TempSrc: Oral     SpO2:  98% 92% 96%  Weight:      Height:       GENERAL:  Alert, pleasant, no acute distress  HEENT:  EOMI CARDIOVASCULAR:  RRR, no murmurs appreciated RESPIRATORY:  Clear to auscultation, no wheezing, rales, or rhonchi GASTROINTESTINAL:  Soft, nontender, nondistended EXTREMITIES:  No LE edema bilaterally NEURO: A&O x 2, no new focal deficits appreciated SKIN:  No rashes noted PSYCH:  Appropriate mood and affect   Data Reviewed:  There are no new results to review at this time.  Family Communication: None at bedside  Disposition: Status is: Inpatient Remains inpatient appropriate because: Urinary tract infection  Planned Discharge Destination: Home    Time spent: 35 minutes  Author: Deanna Artis, DO 11/09/2023 12:37 PM  For on call review [email or web address].

## 2023-11-09 NOTE — Plan of Care (Signed)

## 2023-11-10 DIAGNOSIS — N3000 Acute cystitis without hematuria: Secondary | ICD-10-CM | POA: Diagnosis not present

## 2023-11-10 LAB — CBC
HCT: 35.4 % — ABNORMAL LOW (ref 39.0–52.0)
Hemoglobin: 10.6 g/dL — ABNORMAL LOW (ref 13.0–17.0)
MCH: 27.7 pg (ref 26.0–34.0)
MCHC: 29.9 g/dL — ABNORMAL LOW (ref 30.0–36.0)
MCV: 92.7 fL (ref 80.0–100.0)
Platelets: 330 10*3/uL (ref 150–400)
RBC: 3.82 MIL/uL — ABNORMAL LOW (ref 4.22–5.81)
RDW: 14.8 % (ref 11.5–15.5)
WBC: 13.3 10*3/uL — ABNORMAL HIGH (ref 4.0–10.5)
nRBC: 0 % (ref 0.0–0.2)

## 2023-11-10 LAB — BASIC METABOLIC PANEL
Anion gap: 9 (ref 5–15)
BUN: 33 mg/dL — ABNORMAL HIGH (ref 8–23)
CO2: 25 mmol/L (ref 22–32)
Calcium: 9.6 mg/dL (ref 8.9–10.3)
Chloride: 106 mmol/L (ref 98–111)
Creatinine, Ser: 2.1 mg/dL — ABNORMAL HIGH (ref 0.61–1.24)
GFR, Estimated: 32 mL/min — ABNORMAL LOW (ref >60)
Glucose, Bld: 108 mg/dL — ABNORMAL HIGH (ref 70–99)
Potassium: 3.6 mmol/L (ref 3.5–5.1)
Sodium: 140 mmol/L (ref 135–145)

## 2023-11-10 LAB — URINE CULTURE: Culture: 60000 — AB

## 2023-11-10 LAB — GLUCOSE, CAPILLARY
Glucose-Capillary: 85 mg/dL (ref 70–99)
Glucose-Capillary: 94 mg/dL (ref 70–99)
Glucose-Capillary: 116 mg/dL — ABNORMAL HIGH (ref 70–99)
Glucose-Capillary: 130 mg/dL — ABNORMAL HIGH (ref 70–99)

## 2023-11-10 LAB — MAGNESIUM: Magnesium: 1.7 mg/dL (ref 1.7–2.4)

## 2023-11-10 MED ORDER — SODIUM CHLORIDE 0.9 % IV SOLN
1.0000 g | Freq: Two times a day (BID) | INTRAVENOUS | Status: DC
  Administered 2023-11-10 – 2023-11-11 (×4): 1 g via INTRAVENOUS
  Filled 2023-11-10 (×5): qty 20

## 2023-11-10 NOTE — Plan of Care (Signed)

## 2023-11-10 NOTE — Progress Notes (Addendum)
  Progress Note   Patient: Bobby [name] ZOX:[id number] DOB: 1946-05-30 DOA: 11/08/2023     2 DOS: the patient was seen and examined on 11/10/2023   Brief hospital course: 78 y.o. male with medical history significant of type 2 diabetes, which is diet controlled, COPD, chronic hypoxic respiratory failure on 2 L oxygen, coronary artery disease, CVA, anterior wall infarction with PCI performed, patient having left ventricular thrombus and has been on Eliquis since then, patient also has chronic heart failure with reduced ejection fraction with EF 35 to 40% chronic kidney disease stage IV, hyperlipidemia, hypertension, anxiety depression, GERD, has had multiple admission to the hospital initially after peritonitis secondary to an acute appendicitis and septic shock presenting with UTI that failed outpatient management.  Assessment and Plan:  Urinary tract infection with ESBL E. coli - No improvement despite outpatient Cipro.  UA noting leukocytes and bacteria.  Urine culture noting ESBL E. coli.  Will discontinue empiric ceftriaxone, initiate meropenam.  Leukocytosis improving.  Recheck CBC in AM.  Chronic intra-abdominal abscess - CT scan noting stable appearance.  Continues on doxycycline and ceftriaxone.  Leukocytosis profound on presentation, showing improvement.  General surgery consulted.  Diabetes mellitus - Insulin sliding scale on board.  COPD with chronic hypoxic respiratory failure - Continues on 2 L.  Does not appear to be in acute exacerbation.  Nebulizers on board.  HFrEF/CAD - Does not appear to be in acute exacerbation.  Will monitor closely given IV fluid resuscitation.  Resume cardiac regimen.  CKD 4 - Creatinine stable.  Hypokalemia - Replenishment initiated.  Will recheck CBC and mag in AM.  Physical debilitation muscle weakness - Will order PT consult.       Subjective: Patient resting comfortably this morning.  Denies any fever, chills, chest pain, nausea,  vomiting, abdominal pain.  Does not complain of any urinary complaints at this time.  Physical Exam: Vitals:   11/09/23 1327 11/09/23 1445 11/09/23 2019 11/10/23 0537  BP: 137/65 137/65 137/69 137/66  Pulse: 77  84 76  Resp: 16  20 20   Temp: 97.7 F (36.5 C)  (!) 97.5 F (36.4 C)   TempSrc: Oral  Oral   SpO2: 98%  97% 98%  Weight:      Height:       GENERAL:  Alert, pleasant, no acute distress  HEENT:  EOMI CARDIOVASCULAR:  RRR, no murmurs appreciated RESPIRATORY:  Clear to auscultation, no wheezing, rales, or rhonchi GASTROINTESTINAL:  Soft, nontender, nondistended EXTREMITIES:  No LE edema bilaterally NEURO: no new focal deficits appreciated SKIN:  No rashes noted PSYCH:  Appropriate mood and affect   Data Reviewed:  There are no new results to review at this time.  Family Communication: None at bedside  Disposition: Status is: Inpatient Remains inpatient appropriate because: Urinary tract infection  Planned Discharge Destination: Home    Time spent: 30 minutes  Author: Deanna Artis, DO 11/10/2023 10:51 AM  For on call review [email or web address].

## 2023-11-11 DIAGNOSIS — Z1612 Extended spectrum beta lactamase (ESBL) resistance: Secondary | ICD-10-CM

## 2023-11-11 DIAGNOSIS — A498 Other bacterial infections of unspecified site: Secondary | ICD-10-CM

## 2023-11-11 LAB — CBC
HCT: 38.5 % — ABNORMAL LOW (ref 39.0–52.0)
Hemoglobin: 11.3 g/dL — ABNORMAL LOW (ref 13.0–17.0)
MCH: 27.5 pg (ref 26.0–34.0)
MCHC: 29.4 g/dL — ABNORMAL LOW (ref 30.0–36.0)
MCV: 93.7 fL (ref 80.0–100.0)
Platelets: 360 10*3/uL (ref 150–400)
RBC: 4.11 MIL/uL — ABNORMAL LOW (ref 4.22–5.81)
RDW: 14.7 % (ref 11.5–15.5)
WBC: 12.8 10*3/uL — ABNORMAL HIGH (ref 4.0–10.5)
nRBC: 0 % (ref 0.0–0.2)

## 2023-11-11 LAB — BASIC METABOLIC PANEL
Anion gap: 8 (ref 5–15)
BUN: 39 mg/dL — ABNORMAL HIGH (ref 8–23)
CO2: 27 mmol/L (ref 22–32)
Calcium: 9.9 mg/dL (ref 8.9–10.3)
Chloride: 104 mmol/L (ref 98–111)
Creatinine, Ser: 2.1 mg/dL — ABNORMAL HIGH (ref 0.61–1.24)
GFR, Estimated: 32 mL/min — ABNORMAL LOW (ref >60)
Glucose, Bld: 103 mg/dL — ABNORMAL HIGH (ref 70–99)
Potassium: 4 mmol/L (ref 3.5–5.1)
Sodium: 139 mmol/L (ref 135–145)

## 2023-11-11 LAB — GLUCOSE, CAPILLARY
Glucose-Capillary: 93 mg/dL (ref 70–99)
Glucose-Capillary: 103 mg/dL — ABNORMAL HIGH (ref 70–99)
Glucose-Capillary: 104 mg/dL — ABNORMAL HIGH (ref 70–99)
Glucose-Capillary: 103 mg/dL — ABNORMAL HIGH (ref 70–99)

## 2023-11-11 LAB — MAGNESIUM: Magnesium: 1.9 mg/dL (ref 1.7–2.4)

## 2023-11-11 NOTE — Progress Notes (Signed)
  Progress Note   Patient: Bobby [name] GLO:[id number] DOB: 1946-07-19 DOA: 11/08/2023     3 DOS: the patient was seen and examined on 11/11/2023   Brief hospital course: 78 y.o. male with medical history significant of type 2 diabetes, which is diet controlled, COPD, chronic hypoxic respiratory failure on 2 L oxygen, coronary artery disease, CVA, anterior wall infarction with PCI performed, patient having left ventricular thrombus and has been on Eliquis since then, patient also has chronic heart failure with reduced ejection fraction with EF 35 to 40% chronic kidney disease stage IV, hyperlipidemia, hypertension, anxiety depression, GERD, has had multiple admission to the hospital initially after peritonitis secondary to an acute appendicitis and septic shock presenting with UTI that failed outpatient management.  Assessment and Plan:  Urinary tract infection with ESBL E. coli - No improvement despite outpatient Cipro.  UA noting leukocytes and bacteria.  Urine culture noting ESBL E. coli.  Transitioned to meropenem 3/9.  Will need 3 days antibiotic course.  Leukocytosis improving.  Recheck CBC in AM.  Chronic intra-abdominal abscess - CT scan noting stable appearance.  Continues on doxycycline and ceftriaxone.  Leukocytosis profound on presentation, showing improvement.  General surgery consulted.  Diabetes mellitus - Insulin sliding scale on board.  COPD with chronic hypoxic respiratory failure - Continues on 2 L.  Does not appear to be in acute exacerbation.  Nebulizers on board.  HFrEF/CAD - Does not appear to be in acute exacerbation.  Will monitor closely given IV fluid resuscitation.  Resume cardiac regimen.  CKD 4 - Creatinine stable.  Hypokalemia - Replenishment initiated.  Will recheck CBC and mag in AM.  Physical debilitation muscle weakness - Will order PT consult.       Subjective: Patient sitting in bedside chair this morning, comfortable.  Denies any fever, chills,  chest pain, nausea, vomiting, abdominal pain.  Does not complain of any urinary complaints at this time.  Motivated to be discharged but complacent and as needed for IV antibiotics.  Physical Exam: Vitals:   11/10/23 2121 11/11/23 0555 11/11/23 0616 11/11/23 1212  BP: 130/76 111/88 112/87 104/72  Pulse:  98 67 70  Resp:  18 18 16   Temp:   97.9 F (36.6 C) (!) 97.3 F (36.3 C)  TempSrc:    Oral  SpO2:   99% 93%  Weight:      Height:       GENERAL:  Alert, pleasant, no acute distress  HEENT:  EOMI CARDIOVASCULAR:  RRR, no murmurs appreciated RESPIRATORY:  Clear to auscultation, no wheezing, rales, or rhonchi GASTROINTESTINAL:  Soft, nontender, nondistended EXTREMITIES:  No LE edema bilaterally NEURO: no new focal deficits appreciated SKIN:  No rashes noted PSYCH:  Appropriate mood and affect   Data Reviewed:  There are no new results to review at this time.  Family Communication: None at bedside  Disposition: Status is: Inpatient Remains inpatient appropriate because: Urinary tract infection with ESBL  Planned Discharge Destination: Home    Time spent: 32 minutes  Author: Deanna Artis, DO 11/11/2023 2:39 PM  For on call review [email or web address].

## 2023-11-11 NOTE — Evaluation (Signed)
 Occupational Therapy Evaluation Patient Details Name: Bobby [name] MRN: [id number] DOB: 21-Mar-1946 Today's Date: 11/11/2023   History of Present Illness   Pt is a 78 y.o. male who has had multiple recent6 hospital admissions admitted with urinary frequency and weakness with dx of UTI. PMH significant of type 2 diabetes, which is diet controlled, COPD, chronic hypoxic respiratory failure on 2 L oxygen, coronary artery disease, CVA, anterior wall infarction with PCI performed-patient having left ventricular thrombus and has been on Eliquis since then, patient also has chronic heart failure with reduced ejection fraction with EF 35 to 40% chronic kidney disease stage IV, hyperlipidemia, hypertension, anxiety depression, and GERD.     Clinical Impressions PTA, patient was living alone at home with intermittent assistance from family and friends for bathing and higher level IADL's using a rollator for mobility and had been receiving Home Health therapies. Patient presents with OT problem list outlined below including pain, decreased strength, balance and activity tolerance for BADL's and functional mobility and ambulation. Patient will benefit from continue Acute OT services to maximize functional safety and independence. Pt will require continued HHOT upon discharged to home environment with family and friend support.      If plan is discharge home, recommend the following:   A little help with walking and/or transfers;A little help with bathing/dressing/bathroom;Assistance with cooking/housework;Direct supervision/assist for medications management;Direct supervision/assist for financial management;Assist for transportation;Help with stairs or ramp for entrance     Functional Status Assessment   Patient has had a recent decline in their functional status and demonstrates the ability to make significant improvements in function in a reasonable and predictable amount of time.     Equipment  Recommendations   None recommended by OT      Precautions/Restrictions   Precautions Precautions: Fall Restrictions Weight Bearing Restrictions Per Provider Order: No     Mobility Bed Mobility Overal bed mobility: Needs Assistance Bed Mobility: Supine to Sit   Sidelying to sit: HOB elevated, Used rails, Supervision Supine to sit: Contact guard   Sit to sidelying: Used rails, HOB elevated, Supervision      Transfers Overall transfer level: Needs assistance Equipment used: Rolling walker (2 wheels) Transfers: Sit to/from Stand Sit to Stand: Supervision           General transfer comment: supervision for safey      Balance Overall balance assessment: Needs assistance Sitting-balance support: Feet supported Sitting balance-Leahy Scale: Normal     Standing balance support: No upper extremity supported, During functional activity, Bilateral upper extremity supported Standing balance-Leahy Scale: Fair Standing balance comment: able to perform static standing without UE support                           ADL either performed or assessed with clinical judgement   ADL Overall ADL's : Needs assistance/impaired Eating/Feeding: Independent   Grooming: Wash/dry hands;Wash/dry face;Oral care;Set up   Upper Body Bathing: Set up;Sitting   Lower Body Bathing: Contact guard assist;Sit to/from stand;Sitting/lateral leans   Upper Body Dressing : Set up;Sitting   Lower Body Dressing: Minimal assistance;Sitting/lateral leans;Sit to/from stand   Toilet Transfer: Contact guard assist;Rolling walker (2 wheels);BSC/3in1   Toileting- Clothing Manipulation and Hygiene: Contact guard assist       Functional mobility during ADLs: Contact guard assist General ADL Comments: limited by overall activity tolerance and abdominal pain     Vision Baseline Vision/History: 0 No visual deficits Ability to See in Adequate Light:  0 Adequate Patient Visual Report: No  change from baseline Vision Assessment?: No apparent visual deficits     Perception Perception: Within Functional Limits       Praxis Praxis: WFL       Pertinent Vitals/Pain Pain Assessment Pain Assessment: Faces Faces Pain Scale: Hurts little more Pain Location: lower abdomen Pain Descriptors / Indicators: Discomfort Pain Intervention(s): Limited activity within patient's tolerance, Relaxation     Extremity/Trunk Assessment Upper Extremity Assessment Upper Extremity Assessment: Right hand dominant;Overall [facility] for tasks assessed   Lower Extremity Assessment Lower Extremity Assessment: Defer to PT evaluation   Cervical / Trunk Assessment Cervical / Trunk Assessment: Kyphotic   Communication Communication Communication: No apparent difficulties   Cognition Arousal: Alert Behavior During Therapy: WFL for tasks assessed/performed                                 Following commands: Intact       Cueing  General Comments   Cueing Techniques: Verbal cues  scarring on abdomen   Exercises     Shoulder Instructions      Home Living Family/patient expects to be discharged to:: Private residence Living Arrangements: Alone Available Help at Discharge: Family;Friend(s);Personal care attendant;Available 24 hours/day Type of Home: Independent living facility Bobby [name]) Home Access: Level entry (incline) Entrance Stairs-Number of Steps: 1 Entrance Stairs-Rails: None Home Layout: One level     Bathroom Shower/Tub: Chief Strategy Officer: Standard Bathroom Accessibility: Yes How Accessible: Accessible via walker Home Equipment: Shower seat;Grab bars - toilet;Grab bars - tub/shower;Rollator (4 wheels)   Additional Comments: Has asisst from aides, friends, and family who  supervise/assist. Someone stays with pt overnight. daughter present to asisst with PLOF/home environment as well. Reports pt has had increased asisst due to recurrent  hospitilizations to [employer] safety. Was doing HHPT/OT.      Prior Functioning/Environment Prior Level of Function : Needs assist       Physical Assist : ADLs (physical)     Mobility Comments: No AD at baseline. Had fall at rehab last month when initially getting there. Uses rollator very rarely. ADLs Comments: shower seat. Performs ADLs with standby assist.    OT Problem List: Decreased strength;Decreased activity tolerance;Impaired balance (sitting and/or standing);Decreased knowledge of use of DME or AE;Pain   OT Treatment/Interventions: Self-care/ADL training;Therapeutic exercise;Energy conservation;DME and/or AE instruction;Therapeutic activities;Balance training;Patient/family education      OT Goals(Current goals can be found in the care plan section)   Acute Rehab OT Goals Patient Stated Goal: to get stronger to go home OT Goal Formulation: With patient Time For Goal Achievement: 11/25/23 Potential to Achieve Goals: Good ADL Goals Pt Will Perform Lower Body Bathing: with set-up;sit to/from stand;sitting/lateral leans Pt Will Perform Upper Body Dressing: with modified independence;sitting Pt Will Perform Lower Body Dressing: with supervision;sitting/lateral leans;sit to/from stand Pt Will Transfer to Toilet: with supervision;ambulating;grab bars   OT Frequency:  Min 2X/week       AM-PAC OT "6 Clicks" Daily Activity     Outcome Measure Help from another person eating meals?: None Help from another person taking care of personal grooming?: A Little Help from another person toileting, which includes using toliet, bedpan, or urinal?: A Little Help from another person bathing (including washing, rinsing, drying)?: A Little Help from another person to put on and taking off regular upper body clothing?: A Little Help from another person to put on and taking off  regular lower body clothing?: A Little 6 Click Score: 19   End of Session Equipment Utilized During Treatment:  Gait belt;Rolling walker (2 wheels) Nurse Communication: Mobility status  Activity Tolerance: Patient limited by pain;Patient limited by fatigue Patient left: in bed;with call bell/phone within reach;with bed alarm set  OT Visit Diagnosis: Unsteadiness on feet (R26.81);Muscle weakness (generalized) (M62.81);Pain                Time: 1421-1500 OT Time Calculation (min): 39 min Charges:  OT General Charges $OT Visit: 1 Visit OT Evaluation $OT Eval Moderate Complexity: 1 Mod OT Treatments $Self Care/Home Management : 23-37 mins Vineet Kinney OT/L Acute Rehabilitation Department  [phone]  11/11/2023, 4:42 PM

## 2023-11-11 NOTE — Plan of Care (Signed)

## 2023-11-11 NOTE — Evaluation (Signed)
 Physical Therapy Evaluation Patient Details Name: Bobby [name] MRN: [id number] DOB: 02-17-1946 Today's Date: 11/11/2023  History of Present Illness  Pt is a 78 y.o. male who has had multiple admission to the hospital initially after peritonitis secondary to an acute appendicitis and septic shock presenting with urinary frequency and weakness admitted for UTI. PMH significant of type 2 diabetes, which is diet controlled, COPD, chronic hypoxic respiratory failure on 2 L oxygen, coronary artery disease, CVA, anterior wall infarction with PCI performed-patient having left ventricular thrombus and has been on Eliquis since then, patient also has chronic heart failure with reduced ejection fraction with EF 35 to 40% chronic kidney disease stage IV, hyperlipidemia, hypertension, anxiety depression, and GERD.   Clinical Impression  Pt is a 78 y.o. male with above HPI resulting in the deficits listed below (see PT Problem List). Per pt and daughter pt has been having increased assist from aides, family, and friends for around the clock supervision. Receives assist with shower transfers and some ADLs and ambulates without use of AD primarily- rarely uses rollator. [name] reports increased assist recently to maximize safety due to recurrent hospitalizations and weakness compared to prior level of function. Pt performed sit to stand transfers and ambulated ~93ft with supervision for safety- pt does demonstrate some impulsivity with mobility requiring verbal cuing to maximize safety. Pt will have 24hr assist from multiple family members, friend, and HH aides upon d/c. Pt will benefit from skilled PT to maximize functional mobility to increase independence. Recommend return home with assist from family, friends, and [facility] aides and resume HHPT.          If plan is discharge home, recommend the following: A little help with walking and/or transfers;A little help with bathing/dressing/bathroom;Assistance with  cooking/housework;Help with stairs or ramp for entrance;Assist for transportation   Can travel by private vehicle   Yes    Equipment Recommendations Other (comment) (TBD based on mobility progress- may benefit from RW vs. rollator that he currently has)  Recommendations for Other Services  OT consult    Functional Status Assessment Patient has had a recent decline in their functional status and demonstrates the ability to make significant improvements in function in a reasonable and predictable amount of time.     Precautions / Restrictions Precautions Precautions: Fall Restrictions Weight Bearing Restrictions Per Provider Order: No      Mobility  Bed Mobility               General bed mobility comments: Pt in recliner pre/post session.    Transfers Overall transfer level: Needs assistance Equipment used: Rolling walker (2 wheels) Transfers: Sit to/from Stand Sit to Stand: Supervision           General transfer comment: supervision for safey, pt demonrstring some impulsivity and required cues for stand to sit to slow down and make sure hips in front of recliner so he did not miss when goign to sit down.    Ambulation/Gait Ambulation/Gait assistance: Supervision Gait Distance (Feet): 70 Feet Assistive device: Rolling walker (2 wheels) Gait Pattern/deviations: Decreased stride length, Step-through pattern Gait velocity: decreased     General Gait Details: supervision for safety. pt able to tell therapist when getting fatigued and needing to trun around. Pt deferred trialing ambulation without use of AD today. Pt does not typically use AD at baseline.  Stairs            Wheelchair Mobility     Tilt Bed    Modified  Rankin (Stroke Patients Only)       Balance Overall balance assessment: Needs assistance Sitting-balance support: Feet supported Sitting balance-Leahy Scale: Normal     Standing balance support: No upper extremity supported, During  functional activity, Bilateral upper extremity supported Standing balance-Leahy Scale: Fair Standing balance comment: able to perform static standing without UE support                             Pertinent Vitals/Pain Pain Assessment Pain Assessment: Faces Faces Pain Scale: Hurts little more Pain Location: lower abdomen Pain Descriptors / Indicators: Discomfort Pain Intervention(s): Limited activity within patient's tolerance, Monitored during session    Home Living Family/patient expects to be discharged to:: Private residence Living Arrangements: Alone Available Help at Discharge: Family;Friend(s);Personal care attendant;Available 24 hours/day Type of Home: Independent living facility Dois Davenport) Home Access: Level entry (incline)       Home Layout: One level Home Equipment: Shower seat;Grab bars - toilet;Grab bars - tub/shower;Rollator (4 wheels) Additional Comments: Has asisst from aides, friends, and family who  supervise/assist. Someone stays with pt overnight. daughter present to asisst with PLOF/home environment as well. Reports pt has had increased asisst due to recurrent hospitilizations to [employer] safety. Was doing HHPT/OT.    Prior Function Prior Level of Function : Needs assist             Mobility Comments: No AD at baseline. Had fall at rehab last month when initially getting there. Uses rollator very rarely. ADLs Comments: shower seat. Performs ADLs with standby assist.     Extremity/Trunk Assessment   Upper Extremity Assessment Upper Extremity Assessment: Defer to OT evaluation    Lower Extremity Assessment Lower Extremity Assessment: Generalized weakness       Communication   Communication Communication: No apparent difficulties    Cognition Arousal: Alert Behavior During Therapy: WFL for tasks assessed/performed   PT - Cognitive impairments: No apparent impairments                         Following commands:  Intact       Cueing       General Comments      Exercises     Assessment/Plan    PT Assessment Patient needs continued PT services  PT Problem List Decreased strength;Decreased activity tolerance;Decreased balance;Decreased mobility;Decreased knowledge of use of DME;Obesity       PT Treatment Interventions DME instruction;Gait training;Functional mobility training;Therapeutic exercise;Therapeutic activities;Balance training;Patient/family education    PT Goals (Current goals can be found in the Care Plan section)  Acute Rehab PT Goals Patient Stated Goal: return to prior level of function and improve independence- lot of people in/out of his home PT Goal Formulation: With patient/family Time For Goal Achievement: 11/25/23 Potential to Achieve Goals: Good    Frequency Min 2X/week     Co-evaluation               AM-PAC PT "6 Clicks" Mobility  Outcome Measure Help needed turning from your back to your side while in a flat bed without using bedrails?: A Little Help needed moving from lying on your back to sitting on the side of a flat bed without using bedrails?: A Little Help needed moving to and from a bed to a chair (including a wheelchair)?: A Little Help needed standing up from a chair using your arms (e.g., wheelchair or bedside chair)?: A Little Help needed to walk  in hospital room?: A Little Help needed climbing 3-5 steps with a railing? : A Lot 6 Click Score: 17    End of Session Equipment Utilized During Treatment: Gait belt Activity Tolerance: Patient tolerated treatment well Patient left: in chair;with call bell/phone within reach;with family/visitor present Nurse Communication: Mobility status PT Visit Diagnosis: Other abnormalities of gait and mobility (R26.89);Muscle weakness (generalized) (M62.81)    Time: 0981-1914 PT Time Calculation (min) (ACUTE ONLY): 20 min   Charges:   PT Evaluation $PT Eval Low Complexity: 1 Low   PT General  Charges $$ ACUTE PT VISIT: 1 Visit         Lyman Speller PT, DPT  Acute Rehabilitation Services  Office [phone] 11/11/2023, 12:14 PM
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
## 2023-11-12 ENCOUNTER — Ambulatory Visit: Payer: 59 | Admitting: Cardiovascular Disease

## 2023-11-12 DIAGNOSIS — N390 Urinary tract infection, site not specified: Secondary | ICD-10-CM | POA: Diagnosis not present

## 2023-11-12 DIAGNOSIS — B9629 Other Escherichia coli [E. coli] as the cause of diseases classified elsewhere: Secondary | ICD-10-CM

## 2023-11-12 DIAGNOSIS — Z1612 Extended spectrum beta lactamase (ESBL) resistance: Secondary | ICD-10-CM | POA: Diagnosis not present

## 2023-11-12 LAB — CBC
HCT: 35.4 % — ABNORMAL LOW (ref 39.0–52.0)
Hemoglobin: 10.4 g/dL — ABNORMAL LOW (ref 13.0–17.0)
MCH: 27.1 pg (ref 26.0–34.0)
MCHC: 29.4 g/dL — ABNORMAL LOW (ref 30.0–36.0)
MCV: 92.2 fL (ref 80.0–100.0)
Platelets: 370 10*3/uL (ref 150–400)
RBC: 3.84 MIL/uL — ABNORMAL LOW (ref 4.22–5.81)
RDW: 14.7 % (ref 11.5–15.5)
WBC: 11.5 10*3/uL — ABNORMAL HIGH (ref 4.0–10.5)
nRBC: 0 % (ref 0.0–0.2)

## 2023-11-12 LAB — BASIC METABOLIC PANEL
Anion gap: 7 (ref 5–15)
BUN: 45 mg/dL — ABNORMAL HIGH (ref 8–23)
CO2: 27 mmol/L (ref 22–32)
Calcium: 9.4 mg/dL (ref 8.9–10.3)
Chloride: 105 mmol/L (ref 98–111)
Creatinine, Ser: 2.28 mg/dL — ABNORMAL HIGH (ref 0.61–1.24)
GFR, Estimated: 29 mL/min — ABNORMAL LOW (ref >60)
Glucose, Bld: 93 mg/dL (ref 70–99)
Potassium: 4.1 mmol/L (ref 3.5–5.1)
Sodium: 139 mmol/L (ref 135–145)

## 2023-11-12 LAB — GLUCOSE, CAPILLARY: Glucose-Capillary: 86 mg/dL (ref 70–99)

## 2023-11-12 MED ORDER — HYDRALAZINE HCL 50 MG PO TABS: 25.0000 mg | ORAL_TABLET | Freq: Two times a day (BID) | ORAL | Status: DC

## 2023-11-12 MED ORDER — ISOSORBIDE MONONITRATE ER 30 MG PO TB24: 30.0000 mg | ORAL_TABLET | Freq: Every day | ORAL | Status: DC

## 2023-11-12 MED ORDER — FOSFOMYCIN TROMETHAMINE 3 G PO PACK
3.0000 g | PACK | Freq: Once | ORAL | Status: AC
  Administered 2023-11-12: 3 g via ORAL
  Filled 2023-11-12: qty 3

## 2023-11-12 NOTE — Discharge Summary (Signed)
 Physician Discharge Summary   Patient: Bobby [name] MRN: [id number] DOB: Apr 07, 1946  Admit date:     11/08/2023  Discharge date: 11/12/23  Discharge Physician: Deanna Artis   PCP: Dois Davenport, MD   Recommendations at discharge:   At this time patient will be discharged home.  If you experience any symptoms such as fever, vomiting, shortness of breath, chest pain, abdominal pain, or other concerning symptoms, please call your primary care provider or go to the emergency department immediately.  Discharge Diagnoses: Principal Problem:   UTI (urinary tract infection) Active Problems:   COPD (chronic obstructive pulmonary disease) (HCC)   Obesity (BMI 30-39.9)   Essential hypertension   Tobacco abuse   Non-insulin dependent type 2 diabetes mellitus (HCC)   History of ST elevation myocardial infarction (STEMI)   Chronic HFrEF (heart failure with reduced ejection fraction) (HCC)   Anxiety   CKD (chronic kidney disease), stage IV (HCC)   Abscess of abdominal cavity (HCC)  Resolved Problems:   * No resolved hospital problems. *  Hospital Course: 78 y.o. male with medical history significant of type 2 diabetes, which is diet controlled, COPD, chronic hypoxic respiratory failure on 2 L oxygen, coronary artery disease, CVA, anterior wall infarction with PCI performed, patient having left ventricular thrombus and has been on Eliquis since then, patient also has chronic heart failure with reduced ejection fraction with EF 35 to 40% chronic kidney disease stage IV, hyperlipidemia, hypertension, anxiety depression, GERD, has had multiple admission to the hospital initially after peritonitis secondary to an acute appendicitis and septic shock presenting with UTI that failed outpatient management.   Assessment and Plan:   Urinary tract infection with ESBL E. coli - No improvement despite outpatient Cipro.  UA noting leukocytes and bacteria.  Urine culture noting ESBL E. coli.  Transitioned  to meropenem 3/9.  Leukocytosis nearly resolved.  Will transition to p.o. fosfomycin x 1.  Symptoms resolved.    Chronic intra-abdominal abscess - CT scan noting stable appearance.  Received doxycycline and ceftriaxone while inpatient.  Leukocytosis profound on presentation, now nearly resolved.  Patient to resume previous ciprofloxacin medication.    Diabetes mellitus - Resume home medication regiment   COPD with chronic hypoxic respiratory failure - Continues on 2 L.  Does not appear to be in acute exacerbation.  Resume home regimen.   HFrEF/CAD - Does not appear to be in acute exacerbation.  Resume cardiac regimen.   CKD 4 - Creatinine stable.   Hypokalemia - Resolved after replenishment   Physical debilitation muscle weakness - Evaluated by PT/OT, recommending home health PT/OT.        Consultants: None Procedures performed: None Disposition: Home health Diet recommendation:  Discharge Diet Orders (From admission, onward)     Start     Ordered   11/12/23 0000  Diet - low sodium heart healthy        11/12/23 1044           Cardiac and Carb modified diet DISCHARGE MEDICATION: Allergies as of 11/12/2023       Reactions   Hydrocodone Itching        Medication List     STOP taking these medications    amLODipine 5 MG tablet Commonly known as: NORVASC   Jardiance 10 MG Tabs tablet Generic drug: empagliflozin   pantoprazole 40 MG tablet Commonly known as: PROTONIX       TAKE these medications    albuterol 108 (90 Base) MCG/ACT inhaler Commonly  known as: VENTOLIN HFA Inhale 2 puffs into the lungs every 6 (six) hours as needed for wheezing or shortness of breath.   apixaban 5 MG Tabs tablet Commonly known as: Eliquis Take 1 tablet (5 mg total) by mouth 2 (two) times daily.   aspirin EC 81 MG tablet Take 1 tablet (81 mg total) by mouth daily. Swallow whole. What changed: when to take this   atorvastatin 80 MG tablet Commonly known as:  LIPITOR Take 1 tablet (80 mg total) by mouth daily at 6 PM.   buPROPion 300 MG 24 hr tablet Commonly known as: WELLBUTRIN XL Take 300 mg by mouth daily.   carvedilol 25 MG tablet Commonly known as: COREG TAKE ONE TABLET BY MOUTH TWICE DAILY   ciprofloxacin 250 MG tablet Commonly known as: CIPRO SMARTSIG:1 Tablet(s) By Mouth Every 12 Hours   clonazePAM 1 MG tablet Commonly known as: KLONOPIN Take 1 mg by mouth daily as needed for anxiety (sleep).   Colcrys 0.6 MG tablet Generic drug: colchicine Take 0.6 mg by mouth daily.   COQ10 PO Take 1 capsule by mouth daily.   dapagliflozin propanediol 5 MG Tabs tablet Commonly known as: FARXIGA Take 5 mg by mouth daily.   feeding supplement Liqd Take 237 mLs by mouth 2 (two) times daily between meals.   FISH OIL PO Take 1 tablet by mouth daily.   furosemide 20 MG tablet Commonly known as: LASIX Take 1 tablet (20 mg total) by mouth daily.   hydrALAZINE 50 MG tablet Commonly known as: APRESOLINE Take 0.5 tablets (25 mg total) by mouth in the morning and at bedtime.   isosorbide mononitrate 30 MG 24 hr tablet Commonly known as: IMDUR Take 1 tablet (30 mg total) by mouth daily.   nitroGLYCERIN 0.4 MG SL tablet Commonly known as: NITROSTAT DISSOLVE 1 TABLET UNDER THE TONGUE EVERY 5 MINUTES AS NEEDED FOR CHEST PAIN. DO NOT EXCEED A TOTAL OF 3 DOSES IN 15 MINUTES.   oxyCODONE-acetaminophen 10-325 MG tablet Commonly known as: PERCOCET Take 1 tablet by mouth every 6 (six) hours as needed for pain.   sertraline 100 MG tablet Commonly known as: ZOLOFT Take 100 mg by mouth every evening.   Trelegy Ellipta 200-62.5-25 MCG/ACT Aepb Generic drug: Fluticasone-Umeclidin-Vilant Inhale 2 puffs into the lungs daily.        Follow-up Information     [facility] Follow up.   Specialty: Home Health Services Why: HH physical therapy/nursing Contact information: [address] [place]  [address] [phone]                Discharge Exam: Filed Weights   11/08/23 1223  Weight: 119 kg   GENERAL:  Alert, pleasant, no acute distress  HEENT:  EOMI CARDIOVASCULAR:  RRR, no murmurs appreciated RESPIRATORY:  Clear to auscultation, no wheezing, rales, or rhonchi GASTROINTESTINAL:  Soft, nontender, nondistended EXTREMITIES:  No LE edema bilaterally NEURO: no new focal deficits appreciated SKIN:  No rashes noted PSYCH:  Appropriate mood and affect   Condition at discharge: improving  The results of significant diagnostics from this hospitalization (including imaging, microbiology, ancillary and laboratory) are listed below for reference.   Imaging Studies: CT ABDOMEN PELVIS WO CONTRAST Result Date: 11/08/2023 CLINICAL DATA:  Laparoscopic appendectomy 10/01/2023 complicated by postoperative fluid collections requiring percutaneous drainage, now with appendicitis complication suspected . EXAM: CT ABDOMEN AND PELVIS WITHOUT CONTRAST TECHNIQUE: Multidetector CT imaging of the abdomen and pelvis was performed following the standard protocol without  IV contrast. RADIATION DOSE REDUCTION: This exam was performed according to the departmental dose-optimization program which includes automated exposure control, adjustment of the mA and/or kV according to patient size and/or use of iterative reconstruction technique. COMPARISON:  10/22/2023 CT abdomen/pelvis FINDINGS: Lower chest: No significant pulmonary nodules or acute consolidative airspace disease. ICD leads seen terminating in the right atrium, right ventricular apex and coronary sinus. Hepatobiliary: Mild diffuse hepatic steatosis. Small 2.3 x 1.9 cm fluid collection along the inferior right liver capsule (series 2/image 36), it decreased from 3.6 x 2.7 cm on 10/22/2023 CT. No new liver lesions or new perihepatic collections. Normal gallbladder with no radiopaque cholelithiasis. No biliary ductal dilatation. Pancreas: Normal, with  no mass or duct dilation. Spleen: Normal size. No mass. Adrenals/Urinary Tract: Stable adrenals without discrete adrenal nodules. No hydronephrosis. No renal stones. Simple bilateral renal cysts, largest 6.5 cm in the anterior lower left kidney, for which no follow-up imaging is recommended. Scattered tiny hyperdense left renal cortical lesions, largest 1.3 cm in the anterior upper left kidney on series 2/image 27, unchanged, for which no follow-up imaging is recommended. Normal caliber ureters. No ureteral stones. Completely collapsed bladder, limiting assessment, with no definite bladder abnormality. Stomach/Bowel: Normal non-distended stomach. A few mildly dilated small bowel loops in the anterior abdomen up to the 4.0 cm diameter, without discrete small bowel caliber transition, decreased from 5.4 cm diameter on 10/22/2023 CT, favor mild improving adynamic ileus. No small bowel wall thickening. Status post appendectomy. No discrete fluid collections in the appendectomy bed. Mild left colonic diverticulosis with no large bowel wall thickening or acute pericolonic fat stranding. Vascular/Lymphatic: Atherosclerotic nonaneurysmal abdominal aorta. No pathologically enlarged lymph nodes in the abdomen or pelvis. Reproductive: Top-normal size prostate. Intact appearing partially visualized penile prosthesis. Other: No pneumoperitoneum. Small curvilinear fluid tract in the right pericolic gutter measures 1.5 x 1.0 x 8.5 cm (series 2/image 48), slightly decreased from 1.9 x 1.2 x 9.1 cm using similar measurement technique. No new fluid collections. Scattered regions of mild fat stranding and ill-defined fluid throughout the subcutaneous bilateral abdominal wall without discrete measurable/drainable fluid collections. Musculoskeletal: No aggressive appearing focal osseous lesions. Mild thoracolumbar spondylosis. IMPRESSION: 1. Small 2.3 x 1.9 cm fluid collection along the inferior right liver capsule, decreased from 3.6 x  2.7 cm on 10/22/2023 CT. 2. Small curvilinear fluid tract in the right paracolic gutter, slightly decreased from 10/22/2023 CT. 3. No new or enlarging discrete drainable fluid collections. 4. Scattered regions of mild fat stranding and ill-defined fluid throughout the subcutaneous bilateral abdominal wall without discrete measurable/drainable fluid collections, compatible with postoperative edema and/or cellulitis. 5. Persistent mildly dilated anterior abdominal small bowel loops, improved in the interval, favor improving mild adynamic ileus. 6. Mild diffuse hepatic steatosis. 7. Mild left colonic diverticulosis. 8.  Aortic Atherosclerosis (ICD10-I70.0). Electronically Signed   By: Delbert Phenix M.D.   On: 11/08/2023 17:23   CT ABDOMEN PELVIS WO CONTRAST Result Date: 10/22/2023 CLINICAL DATA:  Abdomen pain EXAM: CT ABDOMEN AND PELVIS WITHOUT CONTRAST TECHNIQUE: Multidetector CT imaging of the abdomen and pelvis was performed following the standard protocol without IV contrast. RADIATION DOSE REDUCTION: This exam was performed according to the departmental dose-optimization program which includes automated exposure control, adjustment of the mA and/or kV according to patient size and/or use of iterative reconstruction technique. COMPARISON:  CT 10/07/2023, 09/30/2023 FINDINGS: Lower chest: Lung bases demonstrate dependent atelectasis. Cardiac pacing leads. Hepatobiliary: Contracted gallbladder. No calcified stones. Interval development of low-density collection in the inferior right  hepatic lobe measuring 3.6 x 3 cm on series 3, image 36. Pancreas: Unremarkable. No pancreatic ductal dilatation or surrounding inflammatory changes. Spleen: Normal in size without focal abnormality. Adrenals/Urinary Tract: Adrenal glands are normal. Kidneys show no hydronephrosis. Bilateral renal cysts and focal hyperdensity in the midpole left kidney for which no imaging follow-up is recommended. Bladder is unremarkable Stomach/Bowel:  Stomach nonenlarged. Patient is status post appendectomy. Increased fluid-filled small bowel dilatation in the lower abdomen and pelvis with bowel distended up to 4.4 cm. Decompressed small bowel distally, findings are suspect for bowel obstruction, possibly due to adhesive disease, slightly tethered appearance of dilated and nondilated small bowel in the right lower quadrant on series 3, image 71. Vascular/Lymphatic: Aortic atherosclerosis. No enlarged abdominal or pelvic lymph nodes. Reproductive: Prostate is unremarkable.  Penile implant. Other: Negative for pelvic effusion. No free air. Interim removal of abdominal drainage catheter. Slightly thick rimmed fluid collection in the right gutter, measures about 6.6 cm craniocaudad by 1.2 cm thick on series 6, image 73 and series 3, image 52 and probably represents organizing fluid collection. Fluid and edema within the right greater than left flank regions without definite rim enhancement. Musculoskeletal: Degenerative changes. No acute osseous abnormality. IMPRESSION: 1. Interim removal of abdominal drainage catheter. Slightly thick rimmed fluid collection in the right gutter measuring up to 6.6 cm craniocaudad by 1.2 cm thick, probably representing organizing fluid collection/possible abscess. Interval development of low-density collection in the inferior right hepatic lobe measuring up to 3.6 cm, suspect for abscess as this was not present on the recent priors. 2. Increased fluid-filled small bowel dilatation in the lower abdomen and pelvis with decompressed small bowel distally, findings are suspect for bowel obstruction, possibly due to adhesive disease. 3. Aortic atherosclerosis. Aortic Atherosclerosis (ICD10-I70.0). Electronically Signed   By: Jasmine Pang M.D.   On: 10/22/2023 21:41    Microbiology: Results for orders placed or performed during the hospital encounter of 11/08/23  Urine Culture     Status: Abnormal   Collection Time: 11/08/23 12:25 PM    Specimen: Urine, Clean Catch  Result Value Ref Range Status   Specimen Description   Final    URINE, CLEAN CATCH Performed at [facility], [address], [place], [place] [address]    Special Requests   Final    NONE Performed at [facility], [address], [place], [place] [address]    Culture (A)  Final    60,000 COLONIES/mL ESCHERICHIA COLI Confirmed Extended Spectrum Beta-Lactamase Producer (ESBL).  In bloodstream infections from ESBL organisms, carbapenems are preferred over piperacillin/tazobactam. They are shown to have a lower risk of mortality.    Report Status 11/10/2023 FINAL  Final   Organism ID, Bacteria ESCHERICHIA COLI (A)  Final      Susceptibility   Escherichia coli - MIC*    AMPICILLIN >=32 RESISTANT Resistant     CEFAZOLIN >=64 RESISTANT Resistant     CEFEPIME 8 INTERMEDIATE Intermediate     CEFTRIAXONE >=64 RESISTANT Resistant     CIPROFLOXACIN >=4 RESISTANT Resistant     GENTAMICIN 2 SENSITIVE Sensitive     IMIPENEM <=0.25 SENSITIVE Sensitive     NITROFURANTOIN <=16 SENSITIVE Sensitive     TRIMETH/SULFA >=320 RESISTANT Resistant     AMPICILLIN/SULBACTAM >=32 RESISTANT Resistant     PIP/TAZO >=128 RESISTANT Resistant ug/mL    * 60,000 COLONIES/mL ESCHERICHIA COLI  Blood culture (routine x 2)     Status: None (Preliminary result)   Collection Time: 11/08/23  1:41 PM   Specimen: BLOOD RIGHT ARM  Result Value Ref Range Status   Specimen Description BLOOD RIGHT ARM  Final   Special Requests   Final    BOTTLES DRAWN AEROBIC AND ANAEROBIC Blood Culture adequate volume   Culture   Final    NO GROWTH 4 DAYS Performed at [facility], [address] [place], [place] [address]    Report Status PENDING  Incomplete  Blood culture (routine x 2)     Status: None (Preliminary result)   Collection Time: 11/08/23  1:46 PM   Specimen: BLOOD LEFT ARM  Result Value Ref Range Status   Specimen Description BLOOD LEFT  ARM  Final   Special Requests   Final    BOTTLES DRAWN AEROBIC AND ANAEROBIC Blood Culture adequate volume   Culture   Final    NO GROWTH 4 DAYS Performed at [facility], [address], [place], [place] [address]    Report Status PENDING  Incomplete    Labs: CBC: Recent Labs  Lab 11/08/23 1306 11/09/23 0529 11/10/23 0448 11/11/23 0420 11/12/23 0432  WBC 21.9* 15.8* 13.3* 12.8* 11.5*  HGB 10.8* 10.1* 10.6* 11.3* 10.4*  HCT 33.8* 33.9* 35.4* 38.5* 35.4*  MCV 87.6 91.9 92.7 93.7 92.2  PLT 341 325 330 360 370   Basic Metabolic Panel: Recent Labs  Lab 11/08/23 1306 11/09/23 0529 11/10/23 0448 11/11/23 0420 11/12/23 0432  NA 137 141 140 139 139  K 3.5 3.2* 3.6 4.0 4.1  CL 102 107 106 104 105  CO2 26 26 25 27 27   GLUCOSE 113* 109* 108* 103* 93  BUN 26* 29* 33* 39* 45*  CREATININE 2.13* 2.13* 2.10* 2.10* 2.28*  CALCIUM 9.4 9.5 9.6 9.9 9.4  MG  --   --  1.7 1.9  --    Liver Function Tests: Recent Labs  Lab 11/08/23 1306 11/09/23 0529  AST 44* 56*  ALT 34 44  ALKPHOS 113 109  BILITOT 0.4 0.4  PROT 6.1* 5.8*  ALBUMIN 3.0* 2.0*   CBG: Recent Labs  Lab 11/11/23 0821 11/11/23 1214 11/11/23 1735 11/11/23 2103 11/12/23 0757  GLUCAP 93 103* 104* 103* 86    Discharge time spent: less than 30 minutes.  Signed: Deanna Artis, DO [facility] 11/12/2023

## 2023-11-12 NOTE — Progress Notes (Signed)
 Patients daughter called to get update. Stated she lives two hours away and would need ample time to get here. Stated she will call back later for update but would like someone to call her if any updates are available before she calls.

## 2023-11-12 NOTE — TOC Transition Note (Signed)
 Transition of Care [facility]) - Discharge Note   Patient Details  Name: Bobby [name] MRN: [id number] Date of Birth: October 20, 1945  [facility]) CM/SW Contact:  Diona Browner, LCSW Phone Number: 11/12/2023, 12:27 PM   Clinical Narrative:    Pt medically ready to return home w/ [facility] services through [facility]. Pt has O2 in place already. Resumption of care orders for [facility] in place. No additional TOC needs.   Final next level of care: Home w Home Health Services Barriers to Discharge: Barriers Resolved   Patient Goals and CMS Choice Patient states their goals for this hospitalization and ongoing recovery are:: return home CMS Medicare.gov Compare Post Acute Care list provided to:: Patient Represenative (must comment) (Vicky(sister)) Choice offered to / list presented to : NA [place] ownership interest in [facility].provided to::  (NA)    Discharge Placement                       Discharge Plan and Services Additional resources added to the After Visit Summary for     Discharge Planning Services: CM Consult Post Acute Care Choice: Home Health          DME Arranged: N/A DME Agency: NA       HH Arranged: PT, OT, RN HH Agency: [facility] Date [facility] Agency Contacted: 11/11/23 Time HH Agency Contacted: [phone]    Social Drivers of Health (SDOH) Interventions SDOH Screenings   Food Insecurity: No Food Insecurity (11/08/2023)  Housing: Low Risk  (11/08/2023)  Transportation Needs: No Transportation Needs (11/08/2023)  Utilities: Not At Risk (11/08/2023)  Social Connections: Socially Isolated (11/08/2023)  Tobacco Use: High Risk (11/08/2023)     Readmission Risk Interventions    11/12/2023   12:26 PM 09/11/2021   12:13 PM  Readmission Risk Prevention Plan  Transportation Screening Complete Complete  PCP or Specialist Appt within 3-5 Days Complete Complete  HRI or Home Care Consult Complete Complete  Social Work Consult for Recovery Care  Planning/Counseling Complete Complete  Palliative Care Screening Not Applicable Not Applicable  Medication Review Oceanographer) Complete Complete

## 2023-11-13 LAB — CULTURE, BLOOD (ROUTINE X 2)
Culture: NO GROWTH
Culture: NO GROWTH
Special Requests: ADEQUATE
Special Requests: ADEQUATE

## 2023-11-20 NOTE — Progress Notes (Signed)
 [facility] NOTE  Referring Physician: Dois Davenport, MD  Primary Care: Dois Davenport, MD Primary Cardiologist: Dr. Tresa Endo  HPI: Bobby [name] is a 78 y.o. 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  Interval hx:  Bobby [name] reports that he has felt much better since placement of CRT-D; he can perform ADLs independently. He has no PND, orthopnea and now minimal lower extremity edema. His wife is here with him today. She reports that despite his reduction in LVEF he is raking the yard, going on walks, etc.   Activity level/exercise tolerance:  NYHA II-III Orthopnea:  Sleeps on 1-2 pillows Paroxysmal noctural dyspnea:  no Chest pain/pressure:  no Orthostatic lightheadedness:  no Palpitations:  no Lower extremity edema:  no Presyncope/syncope:  no Cough:  no  Past Medical History:  Diagnosis Date   DM (diabetes mellitus), type 2 new diagnosis 08/05/2012   diet control   ED (erectile dysfunction)    surgery planned   Hyperlipidemia 08/05/2012   Hypertensive heart disease    Panic attack    Pulmonary HTN (HCC)    echo 08/05/12, EF 55-60%, PA pressure 45mm   S/P coronary artery stent placement, to RCA Promus DES 08/05/2012   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   Tobacco abuse     Current Outpatient Medications  Medication Sig Dispense Refill   albuterol (PROVENTIL HFA;VENTOLIN HFA) 108 (90 Base) MCG/ACT inhaler Inhale 2 puffs into the lungs every 6 (six) hours as needed for wheezing or shortness of breath. 1 Inhaler 2   apixaban (ELIQUIS) 5 MG TABS tablet Take 1 tablet (5 mg total) by mouth 2 (two) times daily. 28  tablet 1   aspirin EC 81 MG tablet Take 1 tablet (81 mg total) by mouth daily. Swallow whole. (Patient taking differently: Take 81 mg by mouth at bedtime. Swallow whole.) 90 tablet 3   atorvastatin (LIPITOR) 80 MG tablet Take 1 tablet (80 mg total) by mouth daily at 6 PM. 90 tablet 3   buPROPion (WELLBUTRIN XL) 300 MG 24 hr tablet Take 300 mg by mouth daily.     carvedilol (COREG) 25 MG tablet TAKE ONE TABLET BY MOUTH TWICE DAILY 180 tablet 1   ciprofloxacin (CIPRO) 250 MG tablet SMARTSIG:1 Tablet(s) By Mouth Every 12 Hours     clonazePAM (KLONOPIN) 1 MG tablet Take 1 mg by mouth daily as needed for anxiety (sleep).     Coenzyme Q10 (COQ10 PO) Take 1 capsule by mouth daily.     COLCRYS 0.6 MG tablet Take 0.6 mg by mouth daily.  3   dapagliflozin propanediol (FARXIGA) 5 MG TABS tablet Take 5 mg by mouth daily.     feeding supplement (ENSURE ENLIVE / ENSURE PLUS) LIQD Take 237 mLs by mouth 2 (two) times daily between meals.     furosemide (LASIX) 20 MG tablet Take 1 tablet (20 mg total) by mouth daily. 30 tablet 3   hydrALAZINE (APRESOLINE) 50 MG tablet Take 0.5 tablets (25 mg total) by mouth in the morning and at bedtime.     isosorbide mononitrate (IMDUR) 30 MG 24 hr tablet Take 1 tablet (30 mg total) by mouth  daily.     nitroGLYCERIN (NITROSTAT) 0.4 MG SL tablet DISSOLVE 1 TABLET UNDER THE TONGUE EVERY 5 MINUTES AS NEEDED FOR CHEST PAIN. DO NOT EXCEED A TOTAL OF 3 DOSES IN 15 MINUTES. 25 tablet 10   Omega-3 Fatty Acids (FISH OIL PO) Take 1 tablet by mouth daily.     oxyCODONE-acetaminophen (PERCOCET) 10-325 MG tablet Take 1 tablet by mouth every 6 (six) hours as needed for pain. 14 tablet 0   sertraline (ZOLOFT) 100 MG tablet Take 100 mg by mouth every evening.     TRELEGY ELLIPTA 200-62.5-25 MCG/ACT AEPB Inhale 2 puffs into the lungs daily.     No current facility-administered medications for this visit.   PHYSICAL EXAM: There were no vitals filed for this visit. GENERAL: NAD Lungs-  *** CARDIAC:  JVP: *** cm          Normal rate with regular rhythm. *** murmur.  Pulses ***. *** edema.  ABDOMEN: Soft, non-tender, non-distended.  EXTREMITIES: Warm and well perfused.  NEUROLOGIC: No obvious FND   DATA REVIEW  ECG: 04/22/23:A-V dual paced rhythm   ECHO: 10/17/22: LVEF 35%-40%, normal RV function. 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  02/22/23: LVEF 35-40%  CATH: 08/12/2017: reviously placed Prox LAD to Mid LAD stent (unknown type) is widely patent. Mid Cx lesion is 99% stenosed. A drug-eluting stent was successfully placed using a STENT SIERRA 3.00 X 28 MM. Post intervention, there is a 0% residual stenosis. Ost 1st Mrg to 1st Mrg lesion is 85% stenosed. A drug-eluting stent was successfully placed using a STENT RESOLUTE ONYX 3.0X18. Post intervention, there is a 0% residual stenosis.   1. Successful stenting of the mid LCx with a DES 2. Successful stenting of the first OM with DES   ASSESSMENT & PLAN:  Heart failure with reduced ejection fraction Etiology of HF: Ischemic cardiomyopathy with inferior MI in 2013 and anterior MI in 2018; also has significant left circumflex disease NYHA class / AHA Stage:NYHA II-III Volume status & Diuretics: decrease lasix to 20mg  daily; euvolemic in exam.  Vasodilators:amlodipine 5mg , imdur 30mg , hydralazine 50mg  TID Beta-Blocker:coreg 25mg  BID MRA:he has advanced CKD; sCr >2.2. If it improves we will start spironolactone Cardiometabolic:start jardiance 10mg  daily; repeat labs in 1 week.  Devices therapies & Valvulopathies:CRT-D Advanced therapies:Not a candidate.   2. CAD - s/p PCI to the RCA in 12/13 for inferior STEMI - s/p PCI to the LAD for anterior STEMI in 12/18 - s/p PCI to the mid LCX and OM1 in 12/18 - Continue lipitor 80mg  daily  3. Obstructive sleep apnea -No longer using CPAP  4. COPD -Currently on Trelegy and Brestri -No wheezing  on exam  5. Chronic kidney disease - Jardiance 10mg  daily - repeat BMP/BNP today.   6. Hyperlipidemia - continue lipitor  7. Tobacco use - continues to smoke 1/2PPD - discussed importance of tobacco cessation.    Carter Kaman [facility] Mechanical Circulatory Support

## 2023-11-22 ENCOUNTER — Ambulatory Visit (HOSPITAL_COMMUNITY)
Admit: 2023-11-22 | Discharge: 2023-11-22 | Disposition: A | Discharge status: Home / Self Care | Payer: 59 | Source: Ambulatory Visit | Attending: Cardiology | Admitting: Cardiology

## 2023-11-22 ENCOUNTER — Encounter (HOSPITAL_COMMUNITY): Payer: Self-pay | Admitting: Cardiology

## 2023-11-22 VITALS — BP 116/68 | HR 67 | Ht 69.0 in | Wt 241.4 lb

## 2023-11-22 DIAGNOSIS — I255 Ischemic cardiomyopathy: Secondary | ICD-10-CM | POA: Diagnosis not present

## 2023-11-22 DIAGNOSIS — Z95810 Presence of automatic (implantable) cardiac defibrillator: Secondary | ICD-10-CM | POA: Insufficient documentation

## 2023-11-22 DIAGNOSIS — Z8673 Personal history of transient ischemic attack (TIA), and cerebral infarction without residual deficits: Secondary | ICD-10-CM | POA: Diagnosis not present

## 2023-11-22 DIAGNOSIS — I130 Hypertensive heart and chronic kidney disease with heart failure and stage 1 through stage 4 chronic kidney disease, or unspecified chronic kidney disease: Secondary | ICD-10-CM | POA: Diagnosis not present

## 2023-11-22 DIAGNOSIS — I252 Old myocardial infarction: Secondary | ICD-10-CM | POA: Diagnosis not present

## 2023-11-22 DIAGNOSIS — I499 Cardiac arrhythmia, unspecified: Secondary | ICD-10-CM | POA: Diagnosis not present

## 2023-11-22 DIAGNOSIS — Z1612 Extended spectrum beta lactamase (ESBL) resistance: Secondary | ICD-10-CM | POA: Insufficient documentation

## 2023-11-22 DIAGNOSIS — G4733 Obstructive sleep apnea (adult) (pediatric): Secondary | ICD-10-CM | POA: Insufficient documentation

## 2023-11-22 DIAGNOSIS — I2510 Atherosclerotic heart disease of native coronary artery without angina pectoris: Secondary | ICD-10-CM | POA: Diagnosis not present

## 2023-11-22 DIAGNOSIS — B9689 Other specified bacterial agents as the cause of diseases classified elsewhere: Secondary | ICD-10-CM | POA: Insufficient documentation

## 2023-11-22 DIAGNOSIS — F17210 Nicotine dependence, cigarettes, uncomplicated: Secondary | ICD-10-CM | POA: Diagnosis not present

## 2023-11-22 DIAGNOSIS — J449 Chronic obstructive pulmonary disease, unspecified: Secondary | ICD-10-CM

## 2023-11-22 DIAGNOSIS — N390 Urinary tract infection, site not specified: Secondary | ICD-10-CM | POA: Insufficient documentation

## 2023-11-22 DIAGNOSIS — N1832 Chronic kidney disease, stage 3b: Secondary | ICD-10-CM

## 2023-11-22 DIAGNOSIS — R5381 Other malaise: Secondary | ICD-10-CM | POA: Insufficient documentation

## 2023-11-22 DIAGNOSIS — I4439 Other atrioventricular block: Secondary | ICD-10-CM | POA: Insufficient documentation

## 2023-11-22 DIAGNOSIS — N189 Chronic kidney disease, unspecified: Secondary | ICD-10-CM | POA: Diagnosis present

## 2023-11-22 DIAGNOSIS — I5022 Chronic systolic (congestive) heart failure: Secondary | ICD-10-CM

## 2023-11-22 DIAGNOSIS — E785 Hyperlipidemia, unspecified: Secondary | ICD-10-CM | POA: Insufficient documentation

## 2023-11-22 DIAGNOSIS — Z79899 Other long term (current) drug therapy: Secondary | ICD-10-CM | POA: Diagnosis not present

## 2023-11-22 DIAGNOSIS — Z955 Presence of coronary angioplasty implant and graft: Secondary | ICD-10-CM | POA: Insufficient documentation

## 2023-11-22 LAB — BASIC METABOLIC PANEL
Anion gap: 6 (ref 5–15)
BUN: 20 mg/dL (ref 8–23)
CO2: 27 mmol/L (ref 22–32)
Calcium: 9.7 mg/dL (ref 8.9–10.3)
Chloride: 105 mmol/L (ref 98–111)
Creatinine, Ser: 1.94 mg/dL — ABNORMAL HIGH (ref 0.61–1.24)
GFR, Estimated: 35 mL/min — ABNORMAL LOW (ref >60)
Glucose, Bld: 101 mg/dL — ABNORMAL HIGH (ref 70–99)
Potassium: 4.2 mmol/L (ref 3.5–5.1)
Sodium: 138 mmol/L (ref 135–145)

## 2023-11-22 LAB — BRAIN NATRIURETIC PEPTIDE: B Natriuretic Peptide: 427.9 pg/mL — ABNORMAL HIGH (ref 0.0–100.0)

## 2023-11-22 LAB — CBC
HCT: 34.3 % — ABNORMAL LOW (ref 39.0–52.0)
Hemoglobin: 10.8 g/dL — ABNORMAL LOW (ref 13.0–17.0)
MCH: 27.8 pg (ref 26.0–34.0)
MCHC: 31.5 g/dL (ref 30.0–36.0)
MCV: 88.2 fL (ref 80.0–100.0)
Platelets: 321 10*3/uL (ref 150–400)
RBC: 3.89 MIL/uL — ABNORMAL LOW (ref 4.22–5.81)
RDW: 15.6 % — ABNORMAL HIGH (ref 11.5–15.5)
WBC: 14 10*3/uL — ABNORMAL HIGH (ref 4.0–10.5)
nRBC: 0 % (ref 0.0–0.2)

## 2023-11-22 NOTE — Patient Instructions (Addendum)
 Medication Changes:  STOP TAKING FARXIGA   CAN TAKE AN EXTRA DOSE OF LASIX (FUROSEMIDE) IF NEEDED FOR SWELLING/WEIGHT GAIN/ SHORTNESS OF BREATH   Lab Work:  Labs done today, your results will be available in MyChart, we will contact you for abnormal readings.  Follow-Up in: 3 MONTHS WITH AN ECHO AS SCHEDULED   At the [facility], you and your health needs are our priority. We have a designated team specialized in the treatment of Heart Failure. This Care Team includes your primary Heart Failure Specialized Cardiologist (physician), Advanced Practice Providers (APPs- Physician Assistants and Nurse Practitioners), and Pharmacist who all work together to provide you with the care you need, when you need it.   You may see any of the following providers on your designated Care Team at your next follow up:  Dr. Arvilla Meres Dr. Marca Ancona Dr. Dorthula Nettles Dr. Theresia Bough Tonye Becket, NP Robbie Lis, [place] [facility] [place], [place] Brynda Peon, NP [place] Lee, NP Karle Plumber, PharmD   Please be sure to bring in all your medications bottles to every appointment.   Need to Contact us:  If you have any questions or concerns before your next appointment please send [place] a message through [place] or call our office at [phone].    TO LEAVE A MESSAGE FOR THE NURSE SELECT OPTION 2, PLEASE LEAVE A MESSAGE INCLUDING: YOUR NAME DATE OF BIRTH CALL BACK NUMBER REASON FOR CALL**this is important as we prioritize the call backs  YOU WILL RECEIVE A CALL BACK THE SAME DAY AS LONG AS YOU CALL BEFORE 4:00 PM

## 2023-11-25 ENCOUNTER — Telehealth (HOSPITAL_COMMUNITY): Payer: Self-pay

## 2023-11-25 NOTE — Telephone Encounter (Addendum)
 Pt aware, agreeable, and verbalized understanding   ----- Message from [facility] sent at 11/22/2023  4:55 PM EDT ----- His WBC ct is once more elevated. Can we let him know to have a low threshold to contact his PCP or urgent care if he has any signs of infection: fever, chills, dysuria. We are holding his SGLT2i now also.   Adi

## 2023-11-28 ENCOUNTER — Encounter: Payer: Self-pay | Admitting: Cardiovascular Disease

## 2023-11-28 ENCOUNTER — Ambulatory Visit: Attending: Cardiovascular Disease | Admitting: Cardiovascular Disease

## 2023-11-28 DIAGNOSIS — I10 Essential (primary) hypertension: Secondary | ICD-10-CM

## 2023-11-28 DIAGNOSIS — Z95810 Presence of automatic (implantable) cardiac defibrillator: Secondary | ICD-10-CM

## 2023-11-28 DIAGNOSIS — J449 Chronic obstructive pulmonary disease, unspecified: Secondary | ICD-10-CM | POA: Diagnosis not present

## 2023-11-28 DIAGNOSIS — N184 Chronic kidney disease, stage 4 (severe): Secondary | ICD-10-CM

## 2023-11-28 DIAGNOSIS — G4733 Obstructive sleep apnea (adult) (pediatric): Secondary | ICD-10-CM

## 2023-11-28 DIAGNOSIS — I255 Ischemic cardiomyopathy: Secondary | ICD-10-CM

## 2023-11-28 DIAGNOSIS — E785 Hyperlipidemia, unspecified: Secondary | ICD-10-CM

## 2023-11-28 NOTE — Progress Notes (Signed)
 I Bobby [name], male   DOB: 1946-05-16, 78 y.o.   MRN: [id number]       HPI: Bobby [name] is a 78 y.o. male presents to the office today for a 7 month followup evaluation.   Bobby [name]  has a long-standing tobacco history having started smoking at age 48, a history of hypertension, remote small TIA/CVA who presented to the hospital on 08/04/2012 in the setting of inferior ST segment elevation myocardial infarction. Catheterization by me revealed a 99% stenosis of the RCA and concomitant CAD involving his LAD and circumflex vessels. He underwent acute percutaneous coronary intervention with an excellent door to balloon time of only 20 minutes and ultimately insertion of a 3.25x32 mm DES stent post dilated 3.3. An echo done in the hospital showed an EF of 50-60% with mild basal inferior probable scar and moderate pulmonary hypertension with PA pressure of 45 mm.  When I saw Bobby [name] in May 2014, he did note shortness of breath with activity. He denied definitive chest pain. At that time, he realized that he had inadvertentlystopped taking the atorvastatin 40 mg dose and this was resumed. In addition, his Toprol dose was increased from 50 to 75 mg daily. A nuclear study showed an ejection fraction of 66%. Perfusion was essentially normal with exception of a small region of fixed inferoseptal bowel artifact and possible small area of inferobasilar scar.   Bobby [name] unfortunately continues to smoke at least a pack of cigarettes per day.   He does experience shortness of breath with activity.  He denies recent chest pain.  He has chronic right bundle branch block with repolarization changes.  He is diabetic and has pulmonary hypertension.  A follow-up nuclear perfusion study on 03/26/2014 remianed low risk and showed normal LV function and normal wall motion.  There was a fixed inferior defect that was felt most consistent with diaphragmatic attenuation.  There was no evidence for ischemia.  He  underwent prostate surgery by Dr. Patsi Sears without cardiovascular compromise.    A screening abdominal aortic ultrasound revealed a normal abdominal aorta.  Since I saw him in November 2017, he had an extensive history and has had recurrent hospitalizations he was hospitalized in October 2018 for left arm pain.  Troponin was negative.  In December 2018 he suffered an anterior ST segment elevation MI due to acute occlusion of his mid LAD and underwent successful PCI of his LAD with insertion of a 2.5 x 22 mm Resolute Onyx DES stent.  He was also found to have 99% dominant mid left circumflex lesion with 80% stenosis in the OM1 vessel and underwent staged intervention to the mid circumflex and marginal vessels.  Troponin was greater than 65.  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  There was mention of sludge is a sign of pre-thrombotic state but no definite thrombus was seen at the apex.  He returned to the hospital August 20, 2017 with malaise and dyspnea.  He was bradycardic.  He was diuresed.  He has renal insufficiency with creatinines increasing up to 1.9.  He has been followed by Brett Fairy on numerous occasions in the office.  Unfortunately continues to smoke cigarettes.  He admits to 1/2 pack/day.  He does note occasional dizziness as well as some shortness of breath.  He denies recurrent anginal type symptoms.   Suggestive of low flow state.  When I  saw him on Jan 07, 2018  I recommended that he undergo a follow-up echo Doppler study to reassess systolic and diastolic function as well as potential for apical thrombus formation.  He had continued to be on aspirin and Brilinta following his STEMI.  He was on atorvastatin 80 mg for hyperlipidemia with target less than 70.  I had a long discussion regarding smoking cessation.  He underwent a follow-up echo Doppler study on Jan 20, 2018.  This showed reduced LV function with an EF  of 35 to 40% with akinesis of the mid apical anterolateral and septal as well as apical walls.  There was grade 1 diastolic dysfunction.  There was now a possible small layered thrombus at the apex noted using Definity and also evidence of swirling at the LV apex suggestive of low flow state.   As result of the echo findings, I saw him in follow-up on Jan 24, 2018 at which time I had a very long discussion with him guarding potential thromboembolic stroke risk.  He was started on warfarin anticoagulation.  Apparently, the Bobby [name] medications have been in a  pack and had been followed by his girlfriend.  He was taking his medications correctly resulting in significant over anticoagulation with INR reached greater than 10.  He has been followed very closely by our pharmacy department and he also was given vitamin K.  Due to his poor medication compliance after much discussion ultimately the thought is for him to transition to Eliquis once his INR gets below 2.  He also has been taking his blood pressure medicines incorrectly.  Bobby [name] been sleeping most of the day.  In the past I had recommended he undergo a sleep evaluation for sleep apnea which he never followed up with it was a no-show in the sleep lab.  He was worked into my schedule on February 21, 2018 and was seen as an add-on due to his medication issues.  He was here now with his sister who in the past had done an excellent job in caring for his medications and she is committed now to resume doing this for him.  During his evaluation, he was exceptionally somnolent, and a significant time was spent with medication adjustment.  Due to his inability to take effect of warfarin the decision was made to use Eliquis for anticoagulation with more consistent dosing.  His recent laboratory had shown stage IV chronic kidney disease and it was recommended that he hold his lisinopril for several days and then reduce the dose to 5 mg as well as holding his  furosemide with subsequent reduction of dose with reinstitution at 20 mg.  I scheduled him for an expeditious sleep study due to concerns for significant sleep apnea and he was seen5 days later by Racquel with medication adjustment and anticoagulation issues.  He underwent a sleep study on February 25, 2018 and he was found to have moderate overall sleep apnea with an AHI of 22.2 and RDI of 25.4.  He could not achieve any rem sleep and the overall severity may very well be underestimated.  He was titrated up to 10 cm water pressure.  An initial trial of CPAP auto with an EPR range of 8-15 was recommended.  When I last saw him on March 19, 2018 he had he had just initiated CPAP the evening before noted significant improvement in his sleep. His renal function had significantly improved from a creatinine of 2.7 down to 1.362 weeks ago.  He  is unaware of any recurrent episodes of arrhythmia.  Apparently his blood pressure had become elevated and Dr. Hal Hope added losartan to his medical regimen.  Of note, he also already was on lisinopril 2.5 mg, furosemide 20 mg, carvedilol 6.25 mg twice a day.  He has been only intermittently using CPAP due to complaints of the mask on his nose bridge.  As result, he was recently changed to a different mask.  A download was obtained in the office today in which he does not meet compliance.  [employer] is his DME company.  His set up date was March 17, 2018 and he has until June 17, 2018 to demonstrate compliance.    When I saw him in September 2019 his blood pressure was elevated.  At that time apparently he was on both the previous lisinopril and the recent losartan that was started by Dr. Hal Hope.  I suggested he discontinue lisinopril and further titrated losartan to 25 mg twice a day both for improved blood pressure and for his reduced LV function.  He was not compliant with reference to his CPAP therapy and I had a long discussion with him regarding the importance that he meet  compliance by October 15.  I saw him in October 2019.  At that time he was not using his CPAP therapy.  I reviewed his blood pressure recordings and they seem to be consistently elevated.  He continues to be on Eliquis and was taking Lasix 20 mg.  That evaluation I had a long discussion with him guarding his noncompliance with CPAP.  I discussed alternatives such as customized oral appliance.  We discussed with his LV dysfunction he may be a candidate for Jardiance if his renal function is stable.  Was maintaining sinus rhythm.  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  His ECG was unchanged.  Troponins were negative.  He was discharged from the ER and seen by Azalee Course, [facility] on October 01, 2018.  He had not had any further chest pain since he left the hospital and no ischemic work-up was planned.  His renal function had worsened and his furosemide was changed to every other day.  Visibly he feels well and has not had recurrent chest pain.  He uses oxygen at 2 L nasal cannula at nighttime instead of his CPAP therapy.  Laboratory 2 days prior to his office visit has shown a creatinine of 2.07 with a BUN of 37.  Potassium was 5.4.   I saw him in February 2020.  At that time his blood pressure was stable and he was taken furosemide 20 mg every other day, carvedilol 18.75 mg twice a day, and valsartan 40 mg.  Renal function improved with discontinuance of furosemide.  I saw him in November 2020 when he came to the office with his sister. He was on supplemental oxygen at nighttime but has not been using CPAP.  Upon further questioning he had difficulty with the mask on the bridge of his nose.  Recently, his blood pressure has been elevated and he has been of seen by Dr. Hal Hope.  He apparently was now on losartan 50 mg in place of valsartan, carvedilol 18.75 mg twice a day,  and he is on Plavix in addition to apixaban 5 mg twice a day.  He continued to be on atorvastatin 80  mg daily for hyperlipidemia.  He is diabetic on metformin.  He is on bupropion 300 mg for depression.  During that evaluation, his blood pressure was elevated at 162/90 I recommended further titration of carvedilol to 25 mg twice a day and added amlodipine 5 mg.  I again discussed the importance of using CPAP therapy.  He had stage III chronic kidney disease with creatinine improving from 2.07 to 1.77.  I saw him in May 2021 at which time any chest pain.He was treated with an infection by Dr. Hal Hope and at times has had some recurrent congestion.  He is breathing better.  He uses supplemental oxygen but admits that at times he does not use it every night.  Unfortunately he still smoking a pack of cigarettes lasting 2 to 2-1/2 days.  Laboratory on November 05, 2019 showed a creatinine at 2.25.  He has recently been on [employer] in addition to albuterol for wheezing.  However he had was not consistently using his combination drug treatment.    I saw him in May 02, 2020.  Over the prior months he continued to be stable from a cardiac standpoint.   He underwent a follow-up echo Doppler study on February 15, 2020 which showed an EF of 35 to 40%.  There was mild LVH of the basal septal segment.  There was grade 1 diastolic dysfunction.  He had previously noted akinesis of the apex and basal inferior wall and mid apical anteroseptal wall.  There was no evidence for residual thrombus.  Last week he under went right neck melanoma resection by Dr. Roderic Scarce.  He has further decreased his tobacco use.    He was evaluated by Corine Shelter in March 2022.  I last saw him on May 11, 2021 at which time he remained relatively stable but admitted to rare episodes of chest discomfort occurring approximately every 1 to 1-1/2 months.  Unfortunately he continues to smoke cigarettes.  He believes he is sleeping adequately.  His  primary physician, Dr. Hal Hope had checked laboratory July 08, 2020.  Creatinine was increased at 1.94.  He is unaware of palpitations, presyncope or syncope.  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  He had anteroseptal Q waves consistent with his prior MI.  I recommended follow-up laboratory and a follow-up echo Doppler assessment.  He was evaluated by pulmonary on June 15, 2019.[name]  He has COPD and dyspnea which is multifactorial as result of his combined systolic and diastolic heart failure, COPD, CAD, and cardiac deconditioning.  I saw him on July 17, 2021.  His echo Doppler from June 05, 2021 showed an EF of 30 to 35% with previously noted septal apical akinesis and inferior wall hypokinesis, mild mitral regurgitation, and mild to moderate aortic valve sclerosis without stenosis.  He has continued to be on Eliquis for anticoagulation.  Laboratory had shown elevation of potassium  potassium at 5.5 and creatinine increased to 2.36.  He was advised to hold losartan as well as discontinue spironolactone.    I saw him on September 05, 2021.  At that time he was experiencing occasional shortness of breath.  He was evaluated by Janeann Forehand, NP pulmonary on August 22, 2021 as part of the lung cancer screening.  Chest CT on December 21 showed tiny pulmonary nodules felt to be long RADS 2 benign appearance.  He also was noted to have coronary atherosclerosis.  He was recently  evaluated at [facility] on January 1, diagnosed with pneumonia and discharged with doxycycline.  He denies any chest tightness or pressure.  He has not been using CPAP therapy.  Follow-up laboratory was recommended and a more recent creatinine had improved to 1.64  He was hospitalized from January 5 through September 11, 2021 with hypoxemia, initially required BiPAP but he became unresponsive and was ultimately intubated.  He was felt to  have septic shock and required pressor therapy.  He ultimately stabilized and remained on antibiotic therapy with ceftriaxone and azithromycin in addition to prednisone.  I last saw him on Jan 09, 2022.  Since his hospitalization, he has felt well.  He sees his primary physician Dr. Nadyne Coombes regularly.  He recently was started on furosemide 40 mg by Dr. Nolene [name].  He continues to smoke.  He denies chest pain.  He does not use CPAP.  He continued to be on low-dose amlodipine 5 mg, carvedilol 25 mg twice a day and low-dose hydralazine 10 mg twice a day.  He was on low-dose prednisone at 10 mg and on atorvastatin 80 mg.  Since I saw him, he presented to the hospital on March 09, 2022 after an episode of syncope.  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  Due to his history of heart failure he underwent insertion of an Abbott CRT-D device on March 11, 2022 by Dr. Elberta Fortis.  He has subsequently seen Dr. Elberta Fortis on June 13, 2022 in the office and was remaining stable.  ECG showed sinus rhythm V paced at a rate of 60.  His device was functioning appropriately.  I saw him on July 18, 2022.  At that time Bobby [name] denied any chest pain or significant shortness of breath.  He no longer uses CPAP therapy for his obstructive sleep apnea.  He is breathing much better.  Unfortunately he is still smoking with a pack lasting 2 to 3 days.  He is now on a regimen of amlodipine 5 mg, carvedilol 25 mg twice a day, hydralazine 50 mg every 8 hours, furosemide 40 mg daily isosorbide 30 mg daily and is on atorvastatin 80 mg for hyperlipidemia.  He is anticoagulated on Eliquis 5 mg twice a day.  He is on Trelegy Ellipta and albuterol for COPD and continues to be on low-dose prednisone 10 mg daily.   At his last office visit, I recommended Bobby [name] have a follow-up 2D echo Doppler study.  His echo was done on October 17, 2022.  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   There was mild LVH.  There is moderate LA dilation.  There was no evidence for apical thrombus.  I saw him on October 26, 2022 at which time Bobby [name] felt well.  He continues to smoke half pack of cigarettes per day.  He underwent right eye cataract surgery yesterday by Dr. Burgess Estelle and is scheduled to undergo left eye cataract surgery in March.  He underwent laboratory in July 18, 2022 which showed stage IV CKD with BUN 66 creatinine 2.43. Lipid studies in July 2023 showed total cholesterol 123, triglycerides 89, HDL 39 LDL 66.  He denies any chest pain.  He denies any awareness of arrhythmia.  There is no recent bleeding.  He was no longer using CPAP therapy.  He has had device monitoring by Dr. Elberta Fortis, last in April 2024 showing battery and lead parameters are stable.  Since I  last saw him, he was recently seen by Carlyon Shadow, NP on March 25, 2023.  He has CKD with most recent creatinine at 2.76 on Jan 11, 2023.  He is on hydralazine 50 mg every 8 hours isosorbide mononitrate 30 mg daily, carvedilol 25 mg twice a day, amlodipine 5 mg daily for his cardiomyopathy.  His last echo Doppler study on October 17, 2022 showed moderate LV dysfunction with EF 35 to 40% with prior apical aneurysm.  He has mild LVH and moderate LA dilation.  He denies recent chest pain.  He continued to smoke half pack per day.  He no longer uses CPAP.    Since I last saw him, he was hospitalized in January when he developed gangrenous appendicitis and underwent appendectomy and drainage.  He subsequently developed an abscess requiring rehospitalization.  He finally quit smoking on September 30, 2023.  He had significant cardiac decompensation and was evaluated by Dr. Gasper Lloyd in our advanced heart failure clinic on November 21, 2021. Currently he is breathing better.  He continues to be on Eliquis 5 mg twice a day for anticoagulation in addition to baby aspirin 81 mg.  He is on carvedilol 25 mg twice a day, furosemide 40 mg  daily, hydralazine 25 mg twice a day, isosorbide 30 mg daily and also is on atorvastatin 80 mg daily for hyperlipidemia.  He is breathing better.  He continues to undergo device checks for his pacemaker with neck scheduled for April 7.  He denies any recurrent anginal type symptoms.  He has been successful with weight loss.  He presents for reevaluation.  Past Medical History:  Diagnosis Date   DM (diabetes mellitus), type 2 new diagnosis 08/05/2012   diet control   ED (erectile dysfunction)    surgery planned   Hyperlipidemia 08/05/2012   Hypertensive heart disease    Panic attack    Pulmonary HTN (HCC)    echo 08/05/12, EF 55-60%, PA pressure 45mm   S/P coronary artery stent placement, to RCA Promus DES 08/05/2012   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   Tobacco abuse     Past Surgical History:  Procedure Laterality Date   BIV ICD INSERTION CRT-D N/A 03/11/2022   Procedure: BIV ICD INSERTION CRT-D;  Surgeon: Regan Lemming, MD;  Location: [facility] INVASIVE CV LAB;  Service: Cardiovascular;  Laterality: N/A;   CARDIAC CATHETERIZATION  08/04/12   PCI to RCA with DES   CORONARY STENT INTERVENTION N/A 08/12/2017   Procedure: CORONARY STENT INTERVENTION;  Surgeon: Swaziland, Peter M, MD;  Location: [facility] INVASIVE CV LAB;  Service: Cardiovascular;  Laterality: N/A;   CORONARY/GRAFT ACUTE MI REVASCULARIZATION N/A 08/09/2017   Procedure: Coronary/Graft Acute MI Revascularization;  Surgeon: Lyn Records, MD;  Location: [facility] INVASIVE CV LAB;  Service: Cardiovascular;  Laterality: N/A;   LAPAROSCOPIC APPENDECTOMY N/A 10/01/2023   Procedure: LAPAROSCOPIC APPENDECTOMY;  Surgeon: Lysle Rubens, MD;  Location: [facility] OR;  Service: General;  Laterality: N/A;   LEFT HEART CATH AND CORONARY ANGIOGRAPHY N/A 08/09/2017   Procedure: LEFT HEART CATH AND CORONARY ANGIOGRAPHY;  Surgeon: Lyn Records, MD;  Location: [facility] INVASIVE CV LAB;  Service: Cardiovascular;  Laterality: N/A;   LEFT HEART CATHETERIZATION  WITH CORONARY ANGIOGRAM N/A 08/04/2012   Procedure: LEFT HEART CATHETERIZATION WITH CORONARY ANGIOGRAM;  Surgeon: Lennette Bihari, MD;  Location: [facility] CATH LAB;  Service: Cardiovascular;  Laterality: N/A;   LEG SURGERY Left    rod placed for fracture repair   PENILE PROSTHESIS  IMPLANT N/A 06/03/2014   Procedure: IMPLANT PENILE PROTHESIS INFLATABLE;  Surgeon: Kathi Ludwig, MD;  Location: [facility] ORS;  Service: Urology;  Laterality: N/A;  with penile block--0.5% marcaine plain   PERCUTANEOUS CORONARY STENT INTERVENTION (PCI-S) Right 08/04/2012   Procedure: PERCUTANEOUS CORONARY STENT INTERVENTION (PCI-S);  Surgeon: Lennette Bihari, MD;  Location: [facility] CATH LAB;  Service: Cardiovascular;  Laterality: Right;   TEMPORARY PACEMAKER N/A 03/08/2022   Procedure: TEMPORARY PACEMAKER;  Surgeon: Lyn Records, MD;  Location: [facility] INVASIVE CV LAB;  Service: Cardiovascular;  Laterality: N/A;   THUMB ARTHROSCOPY Left    thumb joint replaced    Allergies  Allergen Reactions   Hydrocodone Itching    Current Outpatient Medications  Medication Sig Dispense Refill   albuterol (PROVENTIL HFA;VENTOLIN HFA) 108 (90 Base) MCG/ACT inhaler Inhale 2 puffs into the lungs every 6 (six) hours as needed for wheezing or shortness of breath. 1 Inhaler 2   apixaban (ELIQUIS) 5 MG TABS tablet Take 1 tablet (5 mg total) by mouth 2 (two) times daily. 28 tablet 1   aspirin EC 81 MG tablet Take 1 tablet (81 mg total) by mouth daily. Swallow whole. (Bobby [name] taking differently: Take 81 mg by mouth at bedtime. Swallow whole.) 90 tablet 3   atorvastatin (LIPITOR) 80 MG tablet Take 1 tablet (80 mg total) by mouth daily at 6 PM. 90 tablet 3   buPROPion (WELLBUTRIN XL) 300 MG 24 hr tablet Take 300 mg by mouth daily.     carvedilol (COREG) 25 MG tablet TAKE ONE TABLET BY MOUTH TWICE DAILY 180 tablet 1   clonazePAM (KLONOPIN) 1 MG tablet Take 1 mg by mouth daily as needed for anxiety (sleep).     Coenzyme Q10 (COQ10 PO) Take 1 capsule by mouth  daily.     COLCRYS 0.6 MG tablet Take 0.6 mg by mouth daily.  3   feeding supplement (ENSURE ENLIVE / ENSURE PLUS) LIQD Take 237 mLs by mouth 2 (two) times daily between meals.     furosemide (LASIX) 40 MG tablet Take 40 mg by mouth.     hydrALAZINE (APRESOLINE) 50 MG tablet Take 0.5 tablets (25 mg total) by mouth in the morning and at bedtime.     isosorbide mononitrate (IMDUR) 30 MG 24 hr tablet Take 1 tablet (30 mg total) by mouth daily.     nitroGLYCERIN (NITROSTAT) 0.4 MG SL tablet DISSOLVE 1 TABLET UNDER THE TONGUE EVERY 5 MINUTES AS NEEDED FOR CHEST PAIN. DO NOT EXCEED A TOTAL OF 3 DOSES IN 15 MINUTES. 25 tablet 10   Omega-3 Fatty Acids (FISH OIL PO) Take 1 tablet by mouth daily.     oxyCODONE-acetaminophen (PERCOCET) 10-325 MG tablet Take 1 tablet by mouth every 6 (six) hours as needed for pain. 14 tablet 0   sertraline (ZOLOFT) 100 MG tablet Take 100 mg by mouth every evening.     TRELEGY ELLIPTA 200-62.5-25 MCG/ACT AEPB Inhale 2 puffs into the lungs daily.     No current facility-administered medications for this visit.    Socially,  he is divorced. He doesn't walk but not routinely exercise.  He continues to smoke one pack of cigarettes per day.  ROS General: Negative; No fevers, chills, or night sweats;  HEENT: Negative; No changes in vision or hearing, sinus congestion, difficulty swallowing Pulmonary:  Positive for shortness of breath; COPD Cardiovascular: See history of present illness GI: Negative; No nausea, vomiting, diarrhea, or abdominal pain GU: Negative; No dysuria, hematuria, or difficulty voiding Musculoskeletal:  Negative; no myalgias, joint pain, or weakness Hematologic/Oncology: Negative; no easy bruising, bleeding Endocrine: Positive for diabetes no heat/cold intolerance;  Neuro: Negative; no changes in balance, headaches Skin: Negative; No rashes or skin lesions Psychiatric: Negative; No behavioral problems, depression Sleep: obstructive sleep apnea, CPAP  initiated March 17, 2018; currently not on therapy. no bruxism, restless legs, hypnogognic hallucinations, no cataplexy Other comprehensive 14 point system review is negative.   PE BP 138/76   Pulse 76   Ht 5\' 9"  (1.753 m)   Wt 236 lb 9.6 oz (107.3 kg)   SpO2 95%   BMI 34.94 kg/m    Repeat blood pressure by me was 126/76  Wt Readings from Last 3 Encounters:  11/28/23 236 lb 9.6 oz (107.3 kg)  11/22/23 241 lb 6.4 oz (109.5 kg)  11/08/23 262 lb 5.6 oz (119 kg)   General: Alert, oriented, no distress.  Skin: normal turgor, no rashes, warm and dry HEENT: Normocephalic, atraumatic. Pupils equal round and reactive to light; sclera anicteric; extraocular muscles intact;  Nose without nasal septal hypertrophy Mouth/Parynx benign; Mallinpatti scale 3 Neck: No JVD, no carotid bruits; normal carotid upstroke Lungs: clear to ausculatation and percussion; no wheezing or rales Chest wall: without tenderness to palpitation Heart: PMI not displaced, RRR, s1 s2 normal, 1/6 systolic murmur, no diastolic murmur, no rubs, gallops, thrills, or heaves Abdomen: soft, nontender; no hepatosplenomehaly, BS+; abdominal aorta nontender and not dilated by palpation. Back: no CVA tenderness Pulses 2+ Musculoskeletal: full range of motion, normal strength, no joint deformities Extremities: no clubbing cyanosis or edema, Homan's sign negative  Neurologic: grossly nonfocal; Cranial nerves grossly wnl Psychologic: Normal mood and affect    October 26, 2022  ECG (independently read by me): AV paced at 60; PR 226 msec  July 18, 2022 ECG (independently read by me): A paced and sensed, V paced at 61, Anterior Q waves  May 9, 2023ECG (independently read by me):  Sinus bradycardia at 53, LAD, RBBB, Q waves inferiorly and anteriorly    September 05, 2021 ECG (independently read by me):  NSR at 84, LAD, RBBB, LVH Q waves V3-6, II, aVF  July 17, 2021 ECG (independently read by me):  NSR at 62, LAD, RBBB, old  inferior and anterior infarct  May 11, 2021 ECG (independently read by me): Normal sinus rhythm at 76 bpm, right bundle branch block, left anterior hemiblock.  No ectopy.  Anteroseptal Q waves consistent with prior anterior MI  May 02, 2020 ECG (independently read by me): Normal sinus rhythm at 69 bpm, bifascicular block with right bundle branch block and left anterior hemiblock.  On anteroseptal Q waves and inferior Q waves consistent with prior MIs.  Mild T wave abnormality laterally.  QTc interval 432 ms.     May 21, 2021ECG (independently read by me): Sinus bradycarrdia at 57; RBBB, Q V1-3 ands avF  July 24, 2019 ECG (independently read by me): Normal sinus rhythm at 80 bpm.  Right bundle branch block; previously noted Q waves V1 through V4 and 2 3 and F consistent with prior infarct  October 15, 2018 ECG (independently read by me): Normal sinus rhythm at 93 bpm with baseline artifact.  Right bundle branch block.  Anterior Q waves consistent with prior infarct.  Inferior Q waves.  ECG (independently read by me): Sinus rhythm at 65 bpm.  PAC.  Right bundle branch block, inferior Q waves in 3 and aVF, old anterior myocardial infarction.  Lateral T wave abnormality.  Normal intervals.  June 16, 2018 ECG (independently read by me): Normal sinus rhythm at 84 bpm.  Right bundle branch block with repolarization changes.  Inferior Q waves consistent with prior infarct.  Anteroseptal Q waves consistent with prior anterior MI.  May 28, 2018 ECG (independently read by me): Normal sinus rhythm with sinus arrhythmia.  Right bundle branch block with repolarization changes.  Q waves consistent with anteroseptal MI and possible inferior MI no indication to adjust the pain upon his needs  July 17,2019 ECG (independently read by me): Normal sinus rhythm 89 bpm.  Right bundle branch block with repolarization changes.  Inferior Q waves and anterior  Q waves consistent with anterolateral  infarct with possible inferior infarct  February 21, 2018 ECG (independently read by me): Normal sinus rhythm at 95 bpm, right bundle branch block with repolarization changes.  Anterolateral MI.  QTc interval 477 ms  Jan 24, 2018 ECG (independently read by me): Sinus rhythm at 97 bpm.  Right bundle branch block with repolarization changes.  QTc interval 472 ms.  Anterolateral Q waves  Jan 07, 2018 ECG (independently read by me): Normal sinus rhythm at 88 bpm.  Right bundle branch block with repolarization changes.  Inferior Q waves.  T wave anteroseptally, possible anteroseptal MI undetermined.  November 2017 ECG (independently read by me): Normal sinus rhythm at 64 bpm.  Branch block with repolarization changes.  QTc interval 460 ms.  June 2016 ECG (independently read by me): Sinus bradycardia at 57 bpm, right bundle branch block with repositioning changes.  QTc interval 459 ms.  September 2015 ECG (independently read by me and (: Normal sinus rhythm.  Right bundle branch block with repolarization changes.  Ventricular rate 81.  02/25/2014 ECG Normal sinus rhythm.  Right bundle branch block with repolarization changes.  PR interval 148 ms; QTc interval 463 ms  Prior ECG: sinus rhythm at 76 beats per minute; right bundle-branch block with repolarization changes. PR interval 142 ms, QTC 481 ms.   LABS:     Latest Ref Rng & Units 11/22/2023   10:58 AM 11/12/2023    4:32 AM 11/11/2023    4:20 AM  BMP  Glucose 70 - 99 mg/dL 161  93  096   BUN 8 - 23 mg/dL 20  45  39   Creatinine 0.61 - 1.24 mg/dL 0.45  4.09  8.11   Sodium 135 - 145 mmol/L 138  139  139   Potassium 3.5 - 5.1 mmol/L 4.2  4.1  4.0   Chloride 98 - 111 mmol/L 105  105  104   CO2 22 - 32 mmol/L 27  27  27    Calcium 8.9 - 10.3 mg/dL 9.7  9.4  9.9       Latest Ref Rng & Units 11/09/2023    5:29 AM 11/08/2023    1:06 PM 10/24/2023    4:52 AM  Hepatic Function  Total Protein 6.5 - 8.1 g/dL 5.8  6.1  5.8   Albumin 3.5 - 5.0 g/dL 2.0   3.0  2.1   AST 15 - 41 U/L 56  44  26   ALT 0 - 44 U/L 44  34  25   Alk Phosphatase 38 - 126 U/L 109  113  75   Total Bilirubin 0.0 - 1.2 mg/dL 0.4  0.4  0.5       Latest Ref Rng & Units 11/22/2023   10:58 AM 11/12/2023    4:32 AM 11/11/2023    4:20 AM  CBC  WBC 4.0 - 10.5 K/uL 14.0  11.5  12.8   Hemoglobin 13.0 - 17.0 g/dL 46.9  62.9  52.8   Hematocrit 39.0 - 52.0 % 34.3  35.4  38.5   Platelets 150 - 400 K/uL 321  370  360    Lab Results  Component Value Date   MCV 88.2 11/22/2023   MCV 92.2 11/12/2023   MCV 93.7 11/11/2023   Lab Results  Component Value Date   TSH 1.130 03/08/2022   Lipid Panel     Component Value Date/Time   CHOL 123 03/09/2022 0252   CHOL 141 05/30/2021 1050   TRIG 89 03/09/2022 0252   HDL 39 (L) 03/09/2022 0252   HDL 40 05/30/2021 1050   CHOLHDL 3.2 03/09/2022 0252   VLDL 18 03/09/2022 0252   LDLCALC 66 03/09/2022 0252   LDLCALC 77 05/30/2021 1050   INR result since initiation of Coumadin: 1:> 7.6;> >10;> 8.6 (Vit K administered) >4.3; no longer on Coumadin, now on Eliquis  RADIOLOGY: No results found.  IMPRESSION:  1. Essential hypertension   2. Ischemic cardiomyopathy   3. ICD (implantable cardioverter-defibrillator) in place   4. Cardiac resynchronization therapy defibrillator (CRT-D) in place   5. Chronic obstructive pulmonary disease, unspecified COPD type (HCC)   6. OSA (obstructive sleep apnea)   7. Hyperlipidemia LDL goal <55   8. CKD (chronic kidney disease), stage IV [facility])     ASSESSMENT AND PLAN:  Bobby [name] is a 78 year old Caucasian male who suffered an acute coronary syndrome on 08/04/2012 when he presented with subtotal occlusion of his RCA. He had diffuse disease beyond the subtotal occlusion and ultimately had successful insertion of a 3.25x32 mm Promus DES stent. He had concomitant CAD with 40-50% proximal LAD narrowing, 30% circumflex marginal stenoses. NMR lipoprofile after initiation of atorvastatin in January 2014 showed  marked improvement in total cholesterol of 119 LDL 48 LDL particle #992. HDL was very low at 30 with an increased triglyceride at 203 and significantly reduced HDL particle #24.2. His insulin resistance score was elevated at 65.  He suffered an anterior STEMI in December 2018 and underwent successful stenting and several days later he required staged intervention to circumflex and marginal vessel. 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  Warfarin was initially prescribed but due to significant issues with compliance and overmedication the decision was made that he would be safer using Eliquis even though he is on this for apical thrombus rather than atrial fibrillation.   He was subsequently found to have significant obstructive sleep apnea but stopped therapy and uses nocturnal oxygen.  An echo Doppler study in June 2021 showed an EF at 35 to 40% with moderate dilation of LV internal dimensions.  There was mild LVH and grade 1 diastolic dysfunction.  There was evidence for moderate aortic sclerosis with mild mitral annular calcification.  There was no evidence for his previous apical thrombus.  An echo study from June 05, 2021 continued to show reduced EF at 30 to 35%, mild mitral regurgitation, and mild to moderate aortic sclerosis without stenosis.  Previously, he had developed worsening renal function and hyperkalemia necessitating discontinuance of losartan and spironolactone.   He was hospitalized in January 2023 with significant hypoxia, required intubation, but was felt to have septic shock for which he was treated with a brief course of pressor therapy.  He was treated with ceftriaxone and azithromycin in addition to prednisone.  He  was subsequently seen by his primary physician Dr. Hal Hope on several occasions.  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  With his history of heart failure he underwent successful BiV ICD  insertion CRT-D by Dr. Elberta Fortis.  Current device checks have been stable.  Serum creatinine May 10 was 2.76 and on March 21, 2023 had improved to 2.28.  He is on nitrate/hydralazine in addition to carvedilol, furosemide, loaded pain.  He is not having anginal symptoms.  I reviewed his most recent hospitalizations with his ruptured appendix and gangrene.  Fortunately, he has improved and as of September 30, 2023 quit tobacco.  He is now been seen by Dr. Gasper Lloyd in our advanced heart failure clinic and was fairly well compensated on November 22, 2023.  He has been successful with weight loss down from 255 pounds when I saw him in August 2024 weight today is 236 pounds.  His ECG today shows atrially sensed and ventricular paced rhythm at 76 bpm with prolonged AV conduction with PR 220 ms.  He will be having a pacemaker interrogation next week.   He is aware of my upcoming retirement in June. He is scheduled to undergo an echo Doppler study in June 2025 and has a follow-up visit with Dr. Shawna Clamp in late June.    Lennette Bihari, MD, [facility]  12/02/2023 10:12 AM

## 2023-11-28 NOTE — Patient Instructions (Signed)
 Medication Instructions:  No medication changes were made during today's visit.  *If you need a refill on your cardiac medications before your next appointment, please call your pharmacy*   Lab Work: No labs were ordered during today's visit.  If you have labs (blood work) drawn today and your tests are completely normal, you will receive your results only by: MyChart Message (if you have MyChart) OR A paper copy in the mail If you have any lab test that is abnormal or we need to change your treatment, we will call you to review the results.   Testing/Procedures: No procedures were ordered during today's visit.    Follow-Up: At [facility], you and your health needs are our priority.  As part of our continuing mission to provide you with exceptional heart care, we have created designated Provider Care Teams.  These Care Teams include your primary Cardiologist (physician) and Advanced Practice Providers (APPs -  Physician Assistants and Nurse Practitioners) who all work together to provide you with the care you need, when you need it.  We recommend signing up for the patient portal called "MyChart".  Sign up information is provided on this After Visit Summary.  MyChart is used to connect with patients for Virtual Visits (Telemedicine).  Patients are able to view lab/test results, encounter notes, upcoming appointments, etc.  Non-urgent messages can be sent to your provider as well.   To learn more about what you can do with MyChart, go to [email or web address].    Your next appointment:   Dorthula Nettles, DO

## 2023-12-02 ENCOUNTER — Encounter: Payer: Self-pay | Admitting: Cardiovascular Disease

## 2023-12-09 ENCOUNTER — Ambulatory Visit: Payer: Medicare Other

## 2023-12-09 DIAGNOSIS — I255 Ischemic cardiomyopathy: Secondary | ICD-10-CM | POA: Diagnosis not present

## 2023-12-09 DIAGNOSIS — I5022 Chronic systolic (congestive) heart failure: Secondary | ICD-10-CM

## 2023-12-10 LAB — CUP PACEART REMOTE DEVICE CHECK
Battery Remaining Longevity: 44 mo
Battery Remaining Percentage: 69 %
Battery Voltage: 2.96 V
Brady Statistic AP VP Percent: 3.6 %
Brady Statistic AP VS Percent: 1 %
Brady Statistic AS VP Percent: 96 %
Brady Statistic AS VS Percent: 1 %
Brady Statistic RA Percent Paced: 3.3 %
Date Time Interrogation Session: 20250407030528
HighPow Impedance: 72 Ohm
Implantable Lead Connection Status: 753985
Implantable Lead Connection Status: 753985
Implantable Lead Connection Status: 753985
Implantable Lead Implant Date: 20230709
Implantable Lead Implant Date: 20230709
Implantable Lead Implant Date: 20230709
Implantable Lead Location: 753858
Implantable Lead Location: 753859
Implantable Lead Location: 753860
Implantable Pulse Generator Implant Date: 20230709
Lead Channel Impedance Value: 410 Ohm
Lead Channel Impedance Value: 450 Ohm
Lead Channel Impedance Value: 740 Ohm
Lead Channel Pacing Threshold Amplitude: 0.75 V
Lead Channel Pacing Threshold Amplitude: 1.25 V
Lead Channel Pacing Threshold Amplitude: 2.25 V
Lead Channel Pacing Threshold Pulse Width: 0.5 ms
Lead Channel Pacing Threshold Pulse Width: 0.5 ms
Lead Channel Pacing Threshold Pulse Width: 1 ms
Lead Channel Sensing Intrinsic Amplitude: 12 mV
Lead Channel Sensing Intrinsic Amplitude: 3.6 mV
Lead Channel Setting Pacing Amplitude: 1.75 V
Lead Channel Setting Pacing Amplitude: 2.25 V
Lead Channel Setting Pacing Amplitude: 2.75 V
Lead Channel Setting Pacing Pulse Width: 0.5 ms
Lead Channel Setting Pacing Pulse Width: 1 ms
Lead Channel Setting Sensing Sensitivity: 0.5 mV
Pulse Gen Serial Number: 810036011

## 2024-01-02 ENCOUNTER — Encounter (HOSPITAL_COMMUNITY): Payer: Self-pay

## 2024-01-02 ENCOUNTER — Other Ambulatory Visit: Payer: Self-pay

## 2024-01-02 ENCOUNTER — Emergency Department (HOSPITAL_COMMUNITY)
Admission: EM | Admit: 2024-01-02 | Discharge: 2024-01-02 | Disposition: A | Discharge status: Home / Self Care | Attending: Emergency Medicine | Admitting: Emergency Medicine

## 2024-01-02 ENCOUNTER — Emergency Department (HOSPITAL_COMMUNITY)

## 2024-01-02 DIAGNOSIS — R55 Syncope and collapse: Secondary | ICD-10-CM | POA: Insufficient documentation

## 2024-01-02 DIAGNOSIS — Z7901 Long term (current) use of anticoagulants: Secondary | ICD-10-CM | POA: Insufficient documentation

## 2024-01-02 DIAGNOSIS — E1122 Type 2 diabetes mellitus with diabetic chronic kidney disease: Secondary | ICD-10-CM | POA: Insufficient documentation

## 2024-01-02 DIAGNOSIS — Z8673 Personal history of transient ischemic attack (TIA), and cerebral infarction without residual deficits: Secondary | ICD-10-CM | POA: Diagnosis not present

## 2024-01-02 DIAGNOSIS — I129 Hypertensive chronic kidney disease with stage 1 through stage 4 chronic kidney disease, or unspecified chronic kidney disease: Secondary | ICD-10-CM | POA: Diagnosis not present

## 2024-01-02 DIAGNOSIS — Z7951 Long term (current) use of inhaled steroids: Secondary | ICD-10-CM | POA: Diagnosis not present

## 2024-01-02 DIAGNOSIS — Z79899 Other long term (current) drug therapy: Secondary | ICD-10-CM | POA: Insufficient documentation

## 2024-01-02 DIAGNOSIS — J449 Chronic obstructive pulmonary disease, unspecified: Secondary | ICD-10-CM | POA: Insufficient documentation

## 2024-01-02 DIAGNOSIS — R0602 Shortness of breath: Secondary | ICD-10-CM | POA: Insufficient documentation

## 2024-01-02 DIAGNOSIS — R799 Abnormal finding of blood chemistry, unspecified: Secondary | ICD-10-CM | POA: Insufficient documentation

## 2024-01-02 DIAGNOSIS — Z7982 Long term (current) use of aspirin: Secondary | ICD-10-CM | POA: Insufficient documentation

## 2024-01-02 DIAGNOSIS — N189 Chronic kidney disease, unspecified: Secondary | ICD-10-CM | POA: Diagnosis not present

## 2024-01-02 LAB — COMPREHENSIVE METABOLIC PANEL WITH GFR
ALT: 21 U/L (ref 0–44)
AST: 21 U/L (ref 15–41)
Albumin: 3 g/dL — ABNORMAL LOW (ref 3.5–5.0)
Alkaline Phosphatase: 100 U/L (ref 38–126)
Anion gap: 9 (ref 5–15)
BUN: 35 mg/dL — ABNORMAL HIGH (ref 8–23)
CO2: 23 mmol/L (ref 22–32)
Calcium: 10.2 mg/dL (ref 8.9–10.3)
Chloride: 102 mmol/L (ref 98–111)
Creatinine, Ser: 2.22 mg/dL — ABNORMAL HIGH (ref 0.61–1.24)
GFR, Estimated: 30 mL/min — ABNORMAL LOW (ref >60)
Glucose, Bld: 106 mg/dL — ABNORMAL HIGH (ref 70–99)
Potassium: 4.9 mmol/L (ref 3.5–5.1)
Sodium: 134 mmol/L — ABNORMAL LOW (ref 135–145)
Total Bilirubin: 0.9 mg/dL (ref 0.0–1.2)
Total Protein: 7.6 g/dL (ref 6.5–8.1)

## 2024-01-02 LAB — CBC WITH DIFFERENTIAL/PLATELET
Abs Immature Granulocytes: 0.03 10*3/uL (ref 0.00–0.07)
Basophils Absolute: 0.1 10*3/uL (ref 0.0–0.1)
Basophils Relative: 1 %
Eosinophils Absolute: 0.5 10*3/uL (ref 0.0–0.5)
Eosinophils Relative: 7 %
HCT: 37 % — ABNORMAL LOW (ref 39.0–52.0)
Hemoglobin: 11.5 g/dL — ABNORMAL LOW (ref 13.0–17.0)
Immature Granulocytes: 0 %
Lymphocytes Relative: 14 %
Lymphs Abs: 1.1 10*3/uL (ref 0.7–4.0)
MCH: 27.6 pg (ref 26.0–34.0)
MCHC: 31.1 g/dL (ref 30.0–36.0)
MCV: 88.9 fL (ref 80.0–100.0)
Monocytes Absolute: 0.8 10*3/uL (ref 0.1–1.0)
Monocytes Relative: 9 %
Neutro Abs: 5.7 10*3/uL (ref 1.7–7.7)
Neutrophils Relative %: 69 %
Platelets: 262 10*3/uL (ref 150–400)
RBC: 4.16 MIL/uL — ABNORMAL LOW (ref 4.22–5.81)
RDW: 15.8 % — ABNORMAL HIGH (ref 11.5–15.5)
WBC: 8.2 10*3/uL (ref 4.0–10.5)
nRBC: 0 % (ref 0.0–0.2)

## 2024-01-02 LAB — I-STAT CG4 LACTIC ACID, ED: Lactic Acid, Venous: 0.8 mmol/L (ref 0.5–1.9)

## 2024-01-02 LAB — URINALYSIS, W/ REFLEX TO CULTURE (INFECTION SUSPECTED)
Bilirubin Urine: NEGATIVE
Glucose, UA: 50 mg/dL — AB
Hgb urine dipstick: NEGATIVE
Ketones, ur: NEGATIVE mg/dL
Leukocytes,Ua: NEGATIVE
Nitrite: NEGATIVE
Protein, ur: 30 mg/dL — AB
Specific Gravity, Urine: 1.011 (ref 1.005–1.030)
pH: 5 (ref 5.0–8.0)

## 2024-01-02 LAB — BRAIN NATRIURETIC PEPTIDE: B Natriuretic Peptide: 409.5 pg/mL — ABNORMAL HIGH (ref 0.0–100.0)

## 2024-01-02 LAB — TROPONIN I (HIGH SENSITIVITY)
Troponin I (High Sensitivity): 21 ng/L — ABNORMAL HIGH (ref <18)
Troponin I (High Sensitivity): 19 ng/L — ABNORMAL HIGH (ref <18)

## 2024-01-02 LAB — LIPASE, BLOOD: Lipase: 37 U/L (ref 11–51)

## 2024-01-02 NOTE — ED Provider Notes (Signed)
 [place] EMERGENCY DEPARTMENT AT [facility] Provider Note   CSN: [id number] Arrival date & time: 01/02/24  1236     History  Chief Complaint  Patient presents with  . Abnormal Lab    Bobby [name] is a 78 y.o. male.  Bobby [name] is a 78 y.o. male with a history of CKD, hypertension, hyperlipidemia, diabetes, COPD, pulmonary hypertension, stroke, who presents to the emergency department via EMS from [facility] for evaluation of near syncopal episode with hypoxia and hypotension.  Patient was at the office today for a routine follow-up appointment for management of his CKD he had described feeling weak and lightheaded intermittently.  While being evaluated at the office when he stood up he became near syncopal and was noted to be hypotensive and hypoxic, this improved with laying patient down and applying oxygen .  He reports that ever since his recent hospitalization after complications after laparoscopic appendectomy with abscess and urinary tract infection he has felt intermittently weak and dizzy.  Has had some decreased oral intake and weight loss but he attributes this to all the stress from his recent medical issues.  He reports he has been trying to eat and drink normally.  No vomiting or diarrhea.  No fevers or chills, denies chest pain.  He reports some chronic shortness of breath which is unchanged, he wears 2 L nasal cannula at night.  He denies any syncopal episodes at home since his most recent hospitalization.  The history is provided by the patient and a relative.  Abnormal Lab      Home Medications Prior to Admission medications   Medication Sig Start Date End Date Taking? Authorizing Provider  albuterol  (PROVENTIL  HFA;VENTOLIN  HFA) 108 (90 Base) MCG/ACT inhaler Inhale 2 puffs into the lungs every 6 (six) hours as needed for wheezing or shortness of breath. 08/21/17   Bhagat, Annia Kilts, PA  apixaban  (ELIQUIS ) 5 MG TABS tablet Take 1 tablet (5  mg total) by mouth 2 (two) times daily. 03/15/22   Tylene Galla, PA-C  aspirin  EC 81 MG tablet Take 1 tablet (81 mg total) by mouth daily. Swallow whole. Patient taking differently: Take 81 mg by mouth at bedtime. Swallow whole. 03/15/22   Tylene Galla, PA-C  atorvastatin  (LIPITOR ) 80 MG tablet Take 1 tablet (80 mg total) by mouth daily at 6 PM. 03/11/18   Millicent Ally, MD  buPROPion  (WELLBUTRIN  XL) 300 MG 24 hr tablet Take 300 mg by mouth daily.    [provider]  carvedilol  (COREG ) 25 MG tablet TAKE ONE TABLET BY MOUTH TWICE DAILY 11/08/23   Millicent Ally, MD  clonazePAM  (KLONOPIN ) 1 MG tablet Take 1 mg by mouth daily as needed for anxiety (sleep).    [provider]  Coenzyme Q10 (COQ10 PO) Take 1 capsule by mouth daily.    [provider]  COLCRYS  0.6 MG tablet Take 0.6 mg by mouth daily. 03/21/17   [provider]  feeding supplement (ENSURE ENLIVE / ENSURE PLUS) LIQD Take 237 mLs by mouth 2 (two) times daily between meals. 10/11/23   Jodeane Mulligan, DO  furosemide  (LASIX ) 40 MG tablet Take 40 mg by mouth.    [provider]  hydrALAZINE  (APRESOLINE ) 50 MG tablet Take 0.5 tablets (25 mg total) by mouth in the morning and at bedtime. 11/12/23   Jodeane Mulligan, DO  isosorbide  mononitrate (IMDUR ) 30 MG 24 hr tablet Take 1 tablet (30 mg total) by mouth daily.  11/12/23   Jodeane Mulligan, DO  nitroGLYCERIN  (NITROSTAT ) 0.4 MG SL tablet DISSOLVE 1 TABLET UNDER THE TONGUE EVERY 5 MINUTES AS NEEDED FOR CHEST PAIN. DO NOT EXCEED A TOTAL OF 3 DOSES IN 15 MINUTES. 07/11/23   Millicent Ally, MD  Omega-3 Fatty Acids  (FISH OIL PO) Take 1 tablet by mouth daily.    [provider]  oxyCODONE -acetaminophen  (PERCOCET) 10-325 MG tablet Take 1 tablet by mouth every 6 (six) hours as needed for pain. 10/11/23   Jodeane Mulligan, DO  sertraline  (ZOLOFT ) 100 MG tablet Take 100 mg by mouth every evening.    [provider]  Gaylan Kaufman  200-62.5-25 MCG/ACT AEPB Inhale 2 puffs into the lungs daily. 04/02/22   [provider]      Allergies    Hydrocodone    Review of Systems   Review of Systems  Constitutional:  Positive for fatigue. Negative for chills and fever.  Respiratory:  Positive for shortness of breath. Negative for cough.   Cardiovascular:  Negative for chest pain and leg swelling.  Gastrointestinal:  Negative for abdominal pain, nausea and vomiting.  Neurological:  Positive for dizziness and weakness.  All other systems reviewed and are negative.   Physical Exam Updated Vital Signs BP 104/67   Pulse 60   Temp (!) 97.5 F (36.4 C) (Oral)   Resp (!) 22   Ht 5\' 9"  (1.753 m)   Wt 103.4 kg   SpO2 96%   BMI 33.67 kg/m  Physical Exam Vitals and nursing note reviewed.  Constitutional:      General: He is not in acute distress.    Appearance: Normal appearance. He is well-developed and normal weight. He is ill-appearing. He is not diaphoretic.  HENT:     Head: Normocephalic and atraumatic.     Mouth/Throat:     Mouth: Mucous membranes are moist.     Pharynx: Oropharynx is clear.  Eyes:     General:        Right eye: No discharge.        Left eye: No discharge.     Pupils: Pupils are equal, round, and reactive to light.  Cardiovascular:     Rate and Rhythm: Normal rate and regular rhythm.     Pulses: Normal pulses.     Heart sounds: Normal heart sounds.  Pulmonary:     Effort: Pulmonary effort is normal. No respiratory distress.     Breath sounds: Normal breath sounds. No wheezing or rales.     Comments: Respirations equal and unlabored, patient able to speak in full sentences, lungs clear to auscultation bilaterally  Abdominal:     General: Bowel sounds are normal. There is no distension.     Palpations: Abdomen is soft. There is no mass.     Tenderness: There is no abdominal tenderness. There is no guarding.     Comments: Abdomen soft, nondistended, nontender to palpation in all  quadrants without guarding or peritoneal signs  Musculoskeletal:        General: No deformity.     Cervical back: Neck supple.  Skin:    General: Skin is warm and dry.     Capillary Refill: Capillary refill takes less than 2 seconds.  Neurological:     Mental Status: He is alert and oriented to person, place, and time.     Coordination: Coordination normal.     Comments: Speech is clear, able to follow commands No facial asymmetry  Moves extremities without  ataxia, coordination intact  Psychiatric:        Mood and Affect: Mood normal.        Behavior: Behavior normal.     ED Results / Procedures / Treatments   Labs (all labs ordered are listed, but only abnormal results are displayed) Labs Reviewed  COMPREHENSIVE METABOLIC PANEL WITH GFR - Abnormal; Notable for the following components:      Result Value   Sodium 134 (*)    Glucose, Bld 106 (*)    BUN 35 (*)    Creatinine, Ser 2.22 (*)    Albumin  3.0 (*)    GFR, Estimated 30 (*)    All other components within normal limits  CBC WITH DIFFERENTIAL/PLATELET - Abnormal; Notable for the following components:   RBC 4.16 (*)    Hemoglobin 11.5 (*)    HCT 37.0 (*)    RDW 15.8 (*)    All other components within normal limits  CULTURE, BLOOD (ROUTINE X 2)  CULTURE, BLOOD (ROUTINE X 2)  LIPASE, BLOOD  URINALYSIS, W/ REFLEX TO CULTURE (INFECTION SUSPECTED)  BRAIN NATRIURETIC PEPTIDE  I-STAT CG4 LACTIC ACID, ED  I-STAT CG4 LACTIC ACID, ED  TROPONIN I (HIGH SENSITIVITY)  TROPONIN I (HIGH SENSITIVITY)    EKG None  Radiology DG Chest Port 1 View Result Date: 01/02/2024 CLINICAL DATA:  Shortness of breath. EXAM: PORTABLE CHEST 1 VIEW COMPARISON:  10/02/2023. FINDINGS: Stable cardiomegaly. Stable left chest wall cardiac pacemaker. Aortic atherosclerosis. Bilateral central perihilar interstitial prominence, suggestive of pulmonary vascular congestion. Suspected trace right pleural effusion. No pneumothorax. No acute osseous  abnormality. IMPRESSION: Cardiomegaly with pulmonary vascular congestion and trace right pleural effusion. Electronically Signed   By: Mannie Seek M.D.   On: 01/02/2024 14:49    Procedures Procedures    Medications Ordered in ED Medications - No data to display  ED Course/ Medical Decision Making/ A&P Clinical Course as of 01/02/24 1529  Thu Jan 02, 2024  1514 S- not hypotensive, hypoxic, or dizzy here though had an episode of this outpatient at nephrology clinic [ ]  trop and bnp [ ]  urine (hx ESBL) [ ]  CT imaging Ambulate O2 sat [AO]    Clinical Course User Index [AO] Lorain Robson, MD                                 Medical Decision Making Amount and/or Complexity of Data Reviewed Labs: ordered. Radiology: ordered.  78 year old male presents from nephrology clinic after near syncopal episode with associated hypotension and hypoxia.  Upon arrival to the ED patient is normotensive and not requiring oxygen .  He is not orthostatic here in the ED and denies pain.  Feels that his shortness of breath is currently at baseline with his COPD.  Had recent complicated hospital course after he developed a postoperative abscess after appendectomy and then also had a UTI requiring inpatient antibiotic treatment.  Laboratory evaluation thus far is reassuring without leukocytosis, stable hemoglobin, renal function is at baseline and he has no significant electrolyte derangements, normal lipase.  Lactic acid is not elevated.  Blood cultures are pending.  Troponin and BNP pending and awaiting urinalysis sample.  Chest x-ray shows cardiomegaly with some pulmonary vascular congestion and trace right pleural effusion.  CT abdomen pelvis shows mostly resolved intra-abdominal fluid collections without acute findings.  At shift change care signed out to Dr. Marcy Sexton for follow-up on pending labs, if laboratory evaluation is unremarkable  and patient is able to ambulate without hypoxia or return of  symptoms that he experienced at nephrology clinic can be discharged home with medication adjustment recommendations from nephrologist.          Final Clinical Impression(s) / ED Diagnoses Final diagnoses:  None    Rx / DC Orders ED Discharge Orders     None         Sanna Crystal 01/02/24 1529    Sueellen Emery, MD 01/06/24 [phone]

## 2024-01-02 NOTE — ED Triage Notes (Signed)
 Pt was at [facility] for high crt levels. C/O shob/dizziness/weakness. Hx of appendicitis with gangrene. Poor appetite. 90/60 BP. EMS gave 200cc of NS. Axox4.

## 2024-01-02 NOTE — ED Provider Notes (Signed)
 Assume Care Note  Vitals  BP 104/67   Pulse 60   Temp (!) 97.5 F (36.4 C) (Oral)   Resp (!) 22   Ht 5\' 9"  (1.753 m)   Wt 103.4 kg   SpO2 96%   BMI 33.67 kg/m    ED Course / MDM   Clinical Course as of 01/02/24 1714  Thu Jan 02, 2024  1514 S- not hypotensive, hypoxic, or dizzy here though had an episode of this outpatient at nephrology clinic [ ]  trop and bnp [ ]  urine (hx ESBL) [ ]  CT imaging Ambulate O2 sat [AO]    Clinical Course User Index [AO] Lorain Robson, MD   Medical Decision Making Amount and/or Complexity of Data Reviewed Labs: ordered. Radiology: ordered.   Remainder patient's workup resulted with initial troponin of 21, repeat 19.  Patient had similar elevations in the past year.  Given history of CKD, likely elevated due to poor clearance and type II demand.  BNP slightly elevated at 409.  Urinalysis resulted with rare bacteria but no leukocytes, nitrates, or WBCs.  CT imaging resulted with no acute intra-abdominal findings, possible bladder wall thickening and subcutaneous fluid collection noted to the left lower abdominal wall.  On physical exam, abdomen is soft, nontender, nondistended, with no erythema or swelling noted to the left lower quadrant.  Patient denies any dizziness, chest pain, shortness of breath.  He feels well at this time, able to p.o. without difficulty.  Patient likely had a vasovagal episode versus orthostatic episode responsible for his symptoms.  Patient ambulated with RN, maintained O2 sats at 97% with mild tachypnea noted.  He denied any dizziness or return of symptoms.  We discussed his reassuring workup today.  Discussed following up outpatient in the next few days with his PCP.  He is agreeable to this plan, as well as his sister at bedside.  Strict return precautions were given, and patient was discharged in stable condition.  Patient seen in conjunction with Dr. Isaiah Marc, who agreed with the above work-up and plan of care.        Lorain Robson, MD 01/02/24 1743    Mordecai Applebaum, MD 01/02/24 [phone]

## 2024-01-02 NOTE — Discharge Instructions (Addendum)
 You were seen in the ER today for an episode where you nearly passed out.  Your workup did not show any emergency medical symptoms.  Please follow-up with your PCP in the next few days for reevaluation.  Please return to the ED for any emergency medical symptoms.  Per your nephrologist: Stop amlodipine  Decrease coreg  to 12.5mg  BID Hold lasix  for several days.

## 2024-01-02 NOTE — Progress Notes (Signed)
 Brief Nephrology Note  78 yo patient w/ CHF and CKD 4 recent weight loss associated with hospitalization for appendicitis and UTI came to my office for routine follow up.   Said he felt well with intermittent dizziness. BP 100 systolic. No edema. Chronic SOB unchanged per patient. Has COPD and wears 2L [place] at night.   Plan was for labs and stop amlodipine , decrease coreg  to 12.5mg  BID, and hold lasix  for several days.  However, upon standing patient nearly passed out and became clammy with worsening sob. O2 sats around 80. We lay him down, supplemented oxygen , and gave him water. O2 sat improved to 85 but he was sent to the ER due to persistent hypoxia and ongoing hypotension.  We were unable to get labs today.  Appreciate help from ER.

## 2024-01-07 LAB — CULTURE, BLOOD (ROUTINE X 2)
Culture: NO GROWTH
Special Requests: ADEQUATE

## 2024-01-16 ENCOUNTER — Emergency Department (HOSPITAL_COMMUNITY)

## 2024-01-16 ENCOUNTER — Emergency Department (HOSPITAL_COMMUNITY)
Admission: EM | Admit: 2024-01-16 | Discharge: 2024-01-16 | Disposition: A | Discharge status: Home / Self Care | Attending: Emergency Medicine | Admitting: Emergency Medicine

## 2024-01-16 ENCOUNTER — Other Ambulatory Visit: Payer: Self-pay

## 2024-01-16 ENCOUNTER — Encounter (HOSPITAL_COMMUNITY): Payer: Self-pay | Admitting: Emergency Medicine

## 2024-01-16 DIAGNOSIS — I959 Hypotension, unspecified: Secondary | ICD-10-CM | POA: Diagnosis present

## 2024-01-16 DIAGNOSIS — N189 Chronic kidney disease, unspecified: Secondary | ICD-10-CM | POA: Insufficient documentation

## 2024-01-16 DIAGNOSIS — Z7982 Long term (current) use of aspirin: Secondary | ICD-10-CM | POA: Diagnosis not present

## 2024-01-16 DIAGNOSIS — R55 Syncope and collapse: Secondary | ICD-10-CM | POA: Insufficient documentation

## 2024-01-16 DIAGNOSIS — Z8673 Personal history of transient ischemic attack (TIA), and cerebral infarction without residual deficits: Secondary | ICD-10-CM | POA: Insufficient documentation

## 2024-01-16 DIAGNOSIS — I129 Hypertensive chronic kidney disease with stage 1 through stage 4 chronic kidney disease, or unspecified chronic kidney disease: Secondary | ICD-10-CM | POA: Diagnosis not present

## 2024-01-16 DIAGNOSIS — Z79899 Other long term (current) drug therapy: Secondary | ICD-10-CM | POA: Insufficient documentation

## 2024-01-16 DIAGNOSIS — E1122 Type 2 diabetes mellitus with diabetic chronic kidney disease: Secondary | ICD-10-CM | POA: Insufficient documentation

## 2024-01-16 DIAGNOSIS — J449 Chronic obstructive pulmonary disease, unspecified: Secondary | ICD-10-CM | POA: Insufficient documentation

## 2024-01-16 DIAGNOSIS — I2510 Atherosclerotic heart disease of native coronary artery without angina pectoris: Secondary | ICD-10-CM | POA: Diagnosis not present

## 2024-01-16 DIAGNOSIS — Z7901 Long term (current) use of anticoagulants: Secondary | ICD-10-CM | POA: Insufficient documentation

## 2024-01-16 LAB — URINALYSIS, W/ REFLEX TO CULTURE (INFECTION SUSPECTED)
Bacteria, UA: NONE SEEN
Bilirubin Urine: NEGATIVE
Glucose, UA: 50 mg/dL — AB
Hgb urine dipstick: NEGATIVE
Ketones, ur: NEGATIVE mg/dL
Leukocytes,Ua: NEGATIVE
Nitrite: NEGATIVE
Protein, ur: 100 mg/dL — AB
Specific Gravity, Urine: 1.012 (ref 1.005–1.030)
pH: 5 (ref 5.0–8.0)

## 2024-01-16 LAB — CBC WITH DIFFERENTIAL/PLATELET
Abs Immature Granulocytes: 0.05 10*3/uL (ref 0.00–0.07)
Basophils Absolute: 0.1 10*3/uL (ref 0.0–0.1)
Basophils Relative: 1 %
Eosinophils Absolute: 0.4 10*3/uL (ref 0.0–0.5)
Eosinophils Relative: 4 %
HCT: 37 % — ABNORMAL LOW (ref 39.0–52.0)
Hemoglobin: 11.7 g/dL — ABNORMAL LOW (ref 13.0–17.0)
Immature Granulocytes: 1 %
Lymphocytes Relative: 13 %
Lymphs Abs: 1.1 10*3/uL (ref 0.7–4.0)
MCH: 28 pg (ref 26.0–34.0)
MCHC: 31.6 g/dL (ref 30.0–36.0)
MCV: 88.5 fL (ref 80.0–100.0)
Monocytes Absolute: 0.6 10*3/uL (ref 0.1–1.0)
Monocytes Relative: 7 %
Neutro Abs: 6.4 10*3/uL (ref 1.7–7.7)
Neutrophils Relative %: 74 %
Platelets: 273 10*3/uL (ref 150–400)
RBC: 4.18 MIL/uL — ABNORMAL LOW (ref 4.22–5.81)
RDW: 15.3 % (ref 11.5–15.5)
WBC: 8.5 10*3/uL (ref 4.0–10.5)
nRBC: 0 % (ref 0.0–0.2)

## 2024-01-16 LAB — I-STAT CHEM 8, ED
BUN: 44 mg/dL — ABNORMAL HIGH (ref 8–23)
Calcium, Ion: 1.33 mmol/L (ref 1.15–1.40)
Chloride: 108 mmol/L (ref 98–111)
Creatinine, Ser: 2.4 mg/dL — ABNORMAL HIGH (ref 0.61–1.24)
Glucose, Bld: 114 mg/dL — ABNORMAL HIGH (ref 70–99)
HCT: 36 % — ABNORMAL LOW (ref 39.0–52.0)
Hemoglobin: 12.2 g/dL — ABNORMAL LOW (ref 13.0–17.0)
Potassium: 4.4 mmol/L (ref 3.5–5.1)
Sodium: 139 mmol/L (ref 135–145)
TCO2: 20 mmol/L — ABNORMAL LOW (ref 22–32)

## 2024-01-16 LAB — COMPREHENSIVE METABOLIC PANEL WITH GFR
ALT: 22 U/L (ref 0–44)
AST: 22 U/L (ref 15–41)
Albumin: 3 g/dL — ABNORMAL LOW (ref 3.5–5.0)
Alkaline Phosphatase: 113 U/L (ref 38–126)
Anion gap: 8 (ref 5–15)
BUN: 45 mg/dL — ABNORMAL HIGH (ref 8–23)
CO2: 20 mmol/L — ABNORMAL LOW (ref 22–32)
Calcium: 9.9 mg/dL (ref 8.9–10.3)
Chloride: 108 mmol/L (ref 98–111)
Creatinine, Ser: 2.39 mg/dL — ABNORMAL HIGH (ref 0.61–1.24)
GFR, Estimated: 27 mL/min — ABNORMAL LOW (ref >60)
Glucose, Bld: 118 mg/dL — ABNORMAL HIGH (ref 70–99)
Potassium: 4.3 mmol/L (ref 3.5–5.1)
Sodium: 136 mmol/L (ref 135–145)
Total Bilirubin: 0.5 mg/dL (ref 0.0–1.2)
Total Protein: 7.5 g/dL (ref 6.5–8.1)

## 2024-01-16 LAB — TROPONIN I (HIGH SENSITIVITY)
Troponin I (High Sensitivity): 30 ng/L — ABNORMAL HIGH (ref <18)
Troponin I (High Sensitivity): 26 ng/L — ABNORMAL HIGH (ref <18)

## 2024-01-16 LAB — CBG MONITORING, ED: Glucose-Capillary: 114 mg/dL — ABNORMAL HIGH (ref 70–99)

## 2024-01-16 NOTE — ED Triage Notes (Signed)
 Pt BIBA from PCP office w/ c/o near syncopal episode. Pt walked into Drs office and sat down when episode happened and was cool/pale/clammy on assessment. Pts PCP stated that they believe that he has been over diuresed. EMS placed 18G L AC  and gave 500mL NS. Pt color returned and VS stabled. EKG showed paced rhythm. Pt is currently GCS 15 and answering questions appropriately

## 2024-01-16 NOTE — ED Notes (Addendum)
 Patient ambulated without difficulty, no SOB, no chest pain. Patient states he "felt a little dizzy." When returned to room, patient was sitting and BP is 127/62.

## 2024-01-16 NOTE — ED Provider Notes (Signed)
 Patient was signed out to myself at shift change.  Patient blood pressure has greatly improved with IV fluids.  Patient notes he does feel back to his baseline at this time.  He was able to ambulate throughout the emergency department without difficulty and without assistance.  Overall workup has been unremarkable.  Serial troponins have been at his baseline.  Creatinine at his baseline at this point.  Anemia is baseline as well.  Chest x-ray demonstrated no concerning findings.  Discussed with patient need for close follow-up with his primary care doctor on an outpatient basis.  Strict return precautions were discussed for any new or worsening symptoms.  Patient voiced understand the plan and had no additional questions.   Roselynn Connors, PA-C 01/16/24 1914    Rolinda Climes, DO 01/16/24 2208

## 2024-01-16 NOTE — ED Provider Notes (Signed)
 [place] EMERGENCY DEPARTMENT AT [facility] Provider Note   CSN: [id number] Arrival date & time: 01/16/24  1247     History  Chief Complaint  Patient presents with   Hypotension   Near Syncope    Bobby [name] is a 78 y.o. male.  78 year old male brought in by EMS from doctor's office with hypotension and near syncopal episode.  Patient was reportedly pale cool and clammy on EMS arrival.  He reports not feeling well without specific complaint.  He is 2 months status post perforated appendicitis.  Patient was given 500 mL normal saline with EMS, BP improved, symptoms improved.  Patient states has had similar episodes previously when he was diuresed too far.  Past medical history significant for hyperlipidemia, CAD, pulmonary hypertension, CVA, diabetes, hypertension.  Has defibrillator in place.       Home Medications Prior to Admission medications   Medication Sig Start Date End Date Taking? Authorizing Provider  albuterol  (PROVENTIL  HFA;VENTOLIN  HFA) 108 (90 Base) MCG/ACT inhaler Inhale 2 puffs into the lungs every 6 (six) hours as needed for wheezing or shortness of breath. 08/21/17   Bhagat, Annia Kilts, PA  apixaban  (ELIQUIS ) 5 MG TABS tablet Take 1 tablet (5 mg total) by mouth 2 (two) times daily. 03/15/22   Tylene Galla, PA-C  aspirin  EC 81 MG tablet Take 1 tablet (81 mg total) by mouth daily. Swallow whole. Patient taking differently: Take 81 mg by mouth at bedtime. Swallow whole. 03/15/22   Tylene Galla, PA-C  atorvastatin  (LIPITOR ) 80 MG tablet Take 1 tablet (80 mg total) by mouth daily at 6 PM. 03/11/18   Millicent Ally, MD  buPROPion  (WELLBUTRIN  XL) 300 MG 24 hr tablet Take 300 mg by mouth daily.    [provider]  carvedilol  (COREG ) 25 MG tablet TAKE ONE TABLET BY MOUTH TWICE DAILY 11/08/23   Millicent Ally, MD  clonazePAM  (KLONOPIN ) 1 MG tablet Take 1 mg by mouth daily as needed for anxiety (sleep).    [provider]   Coenzyme Q10 (COQ10 PO) Take 1 capsule by mouth daily.    [provider]  COLCRYS  0.6 MG tablet Take 0.6 mg by mouth daily. 03/21/17   [provider]  feeding supplement (ENSURE ENLIVE / ENSURE PLUS) LIQD Take 237 mLs by mouth 2 (two) times daily between meals. 10/11/23   Jodeane Mulligan, DO  furosemide  (LASIX ) 40 MG tablet Take 40 mg by mouth.    [provider]  hydrALAZINE  (APRESOLINE ) 50 MG tablet Take 0.5 tablets (25 mg total) by mouth in the morning and at bedtime. 11/12/23   Jodeane Mulligan, DO  isosorbide  mononitrate (IMDUR ) 30 MG 24 hr tablet Take 1 tablet (30 mg total) by mouth daily. 11/12/23   Jodeane Mulligan, DO  nitroGLYCERIN  (NITROSTAT ) 0.4 MG SL tablet DISSOLVE 1 TABLET UNDER THE TONGUE EVERY 5 MINUTES AS NEEDED FOR CHEST PAIN. DO NOT EXCEED A TOTAL OF 3 DOSES IN 15 MINUTES. 07/11/23   Millicent Ally, MD  Omega-3 Fatty Acids  (FISH OIL PO) Take 1 tablet by mouth daily.    [provider]  oxyCODONE -acetaminophen  (PERCOCET) 10-325 MG tablet Take 1 tablet by mouth every 6 (six) hours as needed for pain. 10/11/23   Jodeane Mulligan, DO  sertraline  (ZOLOFT ) 100 MG tablet Take 100 mg by mouth every evening.    [provider]  Gaylan Kaufman 200-62.5-25 MCG/ACT AEPB Inhale 2 puffs into the lungs daily. 04/02/22  [provider]      Allergies    Hydrocodone    Review of Systems   Review of Systems Negative except as per HPI Physical Exam Updated Vital Signs BP 118/75 (BP Location: Right Arm)   Pulse 70   Temp 97.9 F (36.6 C) (Oral)   Resp 20   SpO2 97%  Physical Exam Vitals and nursing note reviewed.  Constitutional:      General: He is not in acute distress.    Appearance: He is well-developed. He is not diaphoretic.  HENT:     Head: Normocephalic and atraumatic.     Mouth/Throat:     Mouth: Mucous membranes are dry.  Cardiovascular:     Rate and Rhythm: Normal rate and regular rhythm.     Pulses: Normal pulses.      Heart sounds: Normal heart sounds.  Pulmonary:     Effort: Pulmonary effort is normal.     Breath sounds: Normal breath sounds.  Abdominal:     Palpations: Abdomen is soft.     Tenderness: There is no abdominal tenderness.  Musculoskeletal:     Right lower leg: No edema.     Left lower leg: No edema.  Skin:    General: Skin is warm and dry.     Coloration: Skin is pale.  Neurological:     Mental Status: He is alert and oriented to person, place, and time.  Psychiatric:        Behavior: Behavior normal.     ED Results / Procedures / Treatments   Labs (all labs ordered are listed, but only abnormal results are displayed) Labs Reviewed  CBC WITH DIFFERENTIAL/PLATELET - Abnormal; Notable for the following components:      Result Value   RBC 4.18 (*)    Hemoglobin 11.7 (*)    HCT 37.0 (*)    All other components within normal limits  COMPREHENSIVE METABOLIC PANEL WITH GFR - Abnormal; Notable for the following components:   CO2 20 (*)    Glucose, Bld 118 (*)    BUN 45 (*)    Creatinine, Ser 2.39 (*)    Albumin  3.0 (*)    GFR, Estimated 27 (*)    All other components within normal limits  CBG MONITORING, ED - Abnormal; Notable for the following components:   Glucose-Capillary 114 (*)    All other components within normal limits  I-STAT CHEM 8, ED - Abnormal; Notable for the following components:   BUN 44 (*)    Creatinine, Ser 2.40 (*)    Glucose, Bld 114 (*)    TCO2 20 (*)    Hemoglobin 12.2 (*)    HCT 36.0 (*)    All other components within normal limits  TROPONIN I (HIGH SENSITIVITY) - Abnormal; Notable for the following components:   Troponin I (High Sensitivity) 30 (*)    All other components within normal limits  URINALYSIS, W/ REFLEX TO CULTURE (INFECTION SUSPECTED)    EKG EKG Interpretation Date/Time:  Thursday Jan 16 2024 12:56:58 EDT Ventricular Rate:  64 PR Interval:    QRS Duration:  171 QT Interval:  470 QTC Calculation: 485 R  Axis:   -74  Text Interpretation: AV dual-paced rhythm No significant change since last tracing Confirmed by Celesta Coke ([id number]) on 01/16/2024 2:02:21 PM  Radiology DG Chest Port 1 View Result Date: 01/16/2024 CLINICAL DATA:  Near syncope. EXAM: PORTABLE CHEST 1 VIEW COMPARISON:  Chest radiograph dated 01/02/2024. FINDINGS: Minimal bibasilar atelectasis. No focal  consolidation, pleural effusion, pneumothorax. Mild cardiomegaly. The pectoral pacemaker device. No acute osseous pathology. IMPRESSION: 1. No active disease. 2. Mild cardiomegaly. Electronically Signed   By: Angus Bark M.D.   On: 01/16/2024 13:25    Procedures Procedures    Medications Ordered in ED Medications - No data to display  ED Course/ Medical Decision Making/ A&P                                 Medical Decision Making Amount and/or Complexity of Data Reviewed Labs: ordered. Radiology: ordered.   This patient presents to the ED for concern of near syncope/weakness, this involves an extensive number of treatment options, and is a complaint that carries with it a high risk of complications and morbidity.  The differential diagnosis includes but not limited to arrhythmia, electrolyte/metabolic abnormality   Co morbidities that complicate the patient evaluation  CKD, hypertension, hyperlipidemia, diabetes, COPD, pulmonary hypertension, stroke    Additional history obtained:  Additional history obtained from EMS who contributes to history as above External records from outside source obtained and reviewed including prior labs and imaging on file   Lab Tests:  I Ordered, and personally interpreted labs.  The pertinent results include: Initial troponin is 30, not significantly changed from prior.  CMP with creatinine of 2.39, similar to prior.  CBC without significant findings.  CBG is 114, nonfasting.   Imaging Studies ordered:  I ordered imaging studies including chest x-ray I independently  visualized and interpreted imaging which showed no acute findings, device left upper chest I agree with the radiologist interpretation   Cardiac Monitoring: / EKG:  The patient was maintained on a cardiac monitor.  I personally viewed and interpreted the cardiac monitored which showed an underlying rhythm of: Paced, rate 64.   Problem List / ED Course / Critical interventions / Medication management  78 year old male brought in by EMS from doctor's office for near syncopal episode.  EMS states patient was hypotensive, pale cool and clammy on arrival.  He was provided with 500 saline bolus with improvement on arrival although arrival blood pressure of 87/51.  Patient states he does not feel well but not specific complaint.  Patient was monitored in the department, BP improved to 118/75 without additional fluids.  Labs reviewed without significant findings from prior.  Plan is to obtain second troponin, interrogate pacemaker and reassess.  Care signed out at time of shift. I have reviewed the patients home medicines and have made adjustments as needed   Social Determinants of Health:  Lives at home   Test / Admission - Considered:  Disposition pending at time of signout.           Final Clinical Impression(s) / ED Diagnoses Final diagnoses:  Hypotension, unspecified hypotension type    Rx / DC Orders ED Discharge Orders     None         Darlis Eisenmenger, PA-C 01/16/24 1526    Nora Beal Victoria K, DO 01/17/24 1557

## 2024-01-16 NOTE — Discharge Instructions (Signed)
 Please follow-up closely with your primary care doctor on an outpatient basis.  Return to the emergency department immediately for any new or worsening symptoms.

## 2024-01-20 NOTE — Addendum Note (Signed)
 Addended by: Edra Govern D on: 01/20/2024 04:47 PM   Modules accepted: Orders

## 2024-01-20 NOTE — Progress Notes (Signed)
 Remote ICD transmission.

## 2024-02-20 ENCOUNTER — Telehealth (HOSPITAL_COMMUNITY): Payer: Self-pay | Admitting: Cardiology

## 2024-02-20 NOTE — Progress Notes (Signed)
 [facility] NOTE  Referring Physician: Burney Darice CROME, MD  Primary Care: Burney Darice CROME, MD Primary Cardiologist: Dr. Burnard  CC: Heart Failure  HPI: Bobby [name] is a 78 y.o. 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  Interval hx:  - Since admission for ESBL E. coli infection complicated by intra abdominal abscess, Bobby [name] become very weak and deconditioned.  He presents today with his daughter who is conveyed concern about his persistent weakness.  In addition he is becoming progressively more frail. - Increased lightheadedness.  Having more episodes of symptomatic hypotension.    Current Outpatient Medications  Medication Sig Dispense Refill   albuterol  (PROVENTIL  HFA;VENTOLIN  HFA) 108 (90 Base) MCG/ACT inhaler Inhale 2 puffs into the lungs every 6 (six) hours as needed for wheezing or shortness of breath. 1 Inhaler 2   apixaban  (ELIQUIS ) 5 MG TABS tablet Take 1 tablet (5 mg total) by mouth 2 (two) times daily. 28 tablet 1   aspirin  EC 81 MG tablet Take 1 tablet (81 mg total) by mouth daily. Swallow whole. 90 tablet 3   atorvastatin  (LIPITOR ) 80 MG tablet Take 1 tablet (80 mg total) by mouth daily at 6 PM. 90 tablet 3   buPROPion  (WELLBUTRIN  XL) 300 MG 24 hr tablet Take 300 mg by mouth daily.     clonazePAM  (KLONOPIN ) 1 MG tablet Take 1 mg by mouth daily as needed for anxiety (sleep).     Coenzyme Q10 (COQ10 PO) Take 1 capsule by mouth daily.     COLCRYS  0.6 MG tablet Take 0.6 mg by mouth daily.  3   feeding supplement (ENSURE ENLIVE / ENSURE PLUS) LIQD Take 237 mLs by mouth 2 (two) times daily between meals.      furosemide  (LASIX ) 40 MG tablet Take 40 mg by mouth.     isosorbide  mononitrate (IMDUR ) 30 MG 24 hr tablet Take 1 tablet (30 mg total) by mouth daily.     nitroGLYCERIN  (NITROSTAT ) 0.4 MG SL tablet DISSOLVE 1 TABLET UNDER THE TONGUE EVERY 5 MINUTES AS NEEDED FOR CHEST PAIN. DO NOT EXCEED A TOTAL OF 3 DOSES IN 15 MINUTES. 25 tablet 10   Omega-3 Fatty Acids  (FISH OIL PO) Take 1 tablet by mouth daily.     oxyCODONE -acetaminophen  (PERCOCET) 10-325 MG tablet Take 1 tablet by mouth every 6 (six) hours as needed for pain. 14 tablet 0   sertraline  (ZOLOFT ) 100 MG tablet Take 100 mg by mouth every evening.     TRELEGY ELLIPTA 200-62.5-25 MCG/ACT AEPB Inhale 2 puffs into the lungs daily.     carvedilol  (COREG ) 6.25 MG tablet Take 1 tablet (6.25 mg total) by mouth 2 (two) times daily. 60 tablet 3   hydrALAZINE  (APRESOLINE ) 25 MG tablet Take 0.5 tablets (12.5 mg total) by mouth 3 (three) times daily. 45 tablet 5   No current facility-administered medications for this encounter.   PHYSICAL EXAM: Vitals:   02/21/24 1217  BP: 100/60  Pulse: 60  SpO2: 94%   GENERAL: NAD Lungs-CTA CARDIAC:  JVP: 7 cm          Normal rate with regular rhythm.  No murmur.  Pulses 2+.  No  edema.  ABDOMEN: Soft, non-tender, non-distended.  EXTREMITIES: Warm and well perfused.  NEUROLOGIC: No obvious FND    DATA REVIEW  ECG: 04/22/23:A-V dual paced rhythm   ECHO: 10/17/22: LVEF 35%-40%, normal RV function. 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  02/22/23: LVEF 35-40%  CATH: 08/12/2017: reviously placed Prox LAD to Mid LAD stent (unknown type) is widely patent. Mid Cx lesion is 99% stenosed. A drug-eluting stent was successfully placed using a STENT SIERRA 3.00 X 28 MM. Post intervention, there is a 0% residual stenosis. Ost 1st Mrg to 1st Mrg lesion is 85% stenosed. A drug-eluting stent was successfully placed using a STENT RESOLUTE ONYX  3.0X18. Post intervention, there is a 0% residual stenosis.   1. Successful stenting of the mid LCx with a DES 2. Successful stenting of the first OM with DES   ASSESSMENT & PLAN:  Heart failure with reduced ejection fraction Etiology of HF: Ischemic cardiomyopathy with inferior MI in 2013 and anterior MI in 2018; also has significant left circumflex disease NYHA class / AHA Stage:NYHA II-III Volume status & Diuretics:  currently taking lasix  40mg  daily;  Vasodilators: decrease hydralazine  to 25mg  TID.  Beta-Blocker:currently on coreg  12.5mg  BID.  Decrease to 6.25 mg twice daily MRA:he has advanced CKD; sCr >2.2. repeat BMP today.  Cardiometabolic: farxiga  5mg  daily; will hold due to recent ESBL Ecoli UTI Devices therapies & Valvulopathies:CRT-D; reviewed device interrogation; BiV pacigin >99%. He has had several episodes of PMT. Called [employer] rep to increase PVARP.  Advanced therapies:Not a candidate.   2. CAD - s/p PCI to the RCA in 12/13 for inferior STEMI - s/p PCI to the LAD for anterior STEMI in 12/18 - s/p PCI to the mid LCX and OM1 in 12/18 -Continue Lipitor  80 mg daily.  Reports no chest pain.  3. Obstructive sleep apnea -Currently off CPAP.   4. COPD -Currently on Trelegy and Brestri - No wheezing on exam.  Stable  5. Chronic kidney disease - Holding SGLT2i  - Serum creatinine stable at 2.37 today, BUN however continues to rise up to 60 today.  Will repeat labs in 2 weeks.  6. Hyperlipidemia - continue lipitor   7. Tobacco use - continues to smoke 1/2PPD - discussed importance of tobacco cessation.   8. CKD - will repeat BMP/BNP - will consider fineronone 10mg    9. ESBL UTI - Holding farxiga  - repeat CBC/BMP/BNP today.   10. PMT - device interrogation with PMT - [employer] rep contacted; device adjustments / PVARP adjustment.   I spent 30 minutes caring for this patient today including face to face time, ordering and reviewing labs, discussing  severity of his frailty and importance of exercise/physical therapy/nutrition, seeing the patient, documenting in the record, and arranging follow ups.    Bobby [name] Advanced Heart Failure Mechanical Circulatory Support

## 2024-02-20 NOTE — Telephone Encounter (Signed)
 Called to confirm/remind patient of their appointment at the [facility] on 02/20/24 .   Appointment:   [x] Confirmed  [] Left mess   [] No answer/No voice mail  [] VM Full/unable to leave message  [] Phone not in service  Patient reminded to bring all medications and/or complete list.  Confirmed patient has transportation. Gave directions, instructed to utilize valet parking.

## 2024-02-21 ENCOUNTER — Ambulatory Visit (HOSPITAL_COMMUNITY)
Admission: RE | Admit: 2024-02-21 | Discharge: 2024-02-21 | Disposition: A | Discharge status: Home / Self Care | Source: Ambulatory Visit | Attending: Cardiology | Admitting: Cardiology

## 2024-02-21 VITALS — BP 100/60 | HR 60 | Wt 233.2 lb

## 2024-02-21 DIAGNOSIS — B9620 Unspecified Escherichia coli [E. coli] as the cause of diseases classified elsewhere: Secondary | ICD-10-CM | POA: Insufficient documentation

## 2024-02-21 DIAGNOSIS — Z8673 Personal history of transient ischemic attack (TIA), and cerebral infarction without residual deficits: Secondary | ICD-10-CM | POA: Diagnosis not present

## 2024-02-21 DIAGNOSIS — I4510 Unspecified right bundle-branch block: Secondary | ICD-10-CM | POA: Insufficient documentation

## 2024-02-21 DIAGNOSIS — I255 Ischemic cardiomyopathy: Secondary | ICD-10-CM | POA: Diagnosis not present

## 2024-02-21 DIAGNOSIS — N390 Urinary tract infection, site not specified: Secondary | ICD-10-CM | POA: Diagnosis not present

## 2024-02-21 DIAGNOSIS — E785 Hyperlipidemia, unspecified: Secondary | ICD-10-CM | POA: Insufficient documentation

## 2024-02-21 DIAGNOSIS — I5022 Chronic systolic (congestive) heart failure: Secondary | ICD-10-CM | POA: Insufficient documentation

## 2024-02-21 DIAGNOSIS — I340 Nonrheumatic mitral (valve) insufficiency: Secondary | ICD-10-CM | POA: Diagnosis not present

## 2024-02-21 DIAGNOSIS — F17210 Nicotine dependence, cigarettes, uncomplicated: Secondary | ICD-10-CM | POA: Diagnosis not present

## 2024-02-21 DIAGNOSIS — N189 Chronic kidney disease, unspecified: Secondary | ICD-10-CM | POA: Insufficient documentation

## 2024-02-21 DIAGNOSIS — J449 Chronic obstructive pulmonary disease, unspecified: Secondary | ICD-10-CM | POA: Diagnosis not present

## 2024-02-21 DIAGNOSIS — Z79899 Other long term (current) drug therapy: Secondary | ICD-10-CM | POA: Diagnosis not present

## 2024-02-21 DIAGNOSIS — G4733 Obstructive sleep apnea (adult) (pediatric): Secondary | ICD-10-CM | POA: Insufficient documentation

## 2024-02-21 DIAGNOSIS — Z955 Presence of coronary angioplasty implant and graft: Secondary | ICD-10-CM | POA: Diagnosis not present

## 2024-02-21 DIAGNOSIS — Z1612 Extended spectrum beta lactamase (ESBL) resistance: Secondary | ICD-10-CM | POA: Diagnosis not present

## 2024-02-21 DIAGNOSIS — I4439 Other atrioventricular block: Secondary | ICD-10-CM | POA: Diagnosis not present

## 2024-02-21 DIAGNOSIS — E1122 Type 2 diabetes mellitus with diabetic chronic kidney disease: Secondary | ICD-10-CM | POA: Insufficient documentation

## 2024-02-21 DIAGNOSIS — I130 Hypertensive heart and chronic kidney disease with heart failure and stage 1 through stage 4 chronic kidney disease, or unspecified chronic kidney disease: Secondary | ICD-10-CM | POA: Diagnosis not present

## 2024-02-21 DIAGNOSIS — I2510 Atherosclerotic heart disease of native coronary artery without angina pectoris: Secondary | ICD-10-CM | POA: Diagnosis not present

## 2024-02-21 DIAGNOSIS — R29898 Other symptoms and signs involving the musculoskeletal system: Secondary | ICD-10-CM | POA: Diagnosis not present

## 2024-02-21 DIAGNOSIS — R5381 Other malaise: Secondary | ICD-10-CM | POA: Diagnosis present

## 2024-02-21 DIAGNOSIS — Z95810 Presence of automatic (implantable) cardiac defibrillator: Secondary | ICD-10-CM | POA: Insufficient documentation

## 2024-02-21 DIAGNOSIS — I252 Old myocardial infarction: Secondary | ICD-10-CM | POA: Diagnosis not present

## 2024-02-21 DIAGNOSIS — Z7901 Long term (current) use of anticoagulants: Secondary | ICD-10-CM | POA: Insufficient documentation

## 2024-02-21 DIAGNOSIS — R54 Age-related physical debility: Secondary | ICD-10-CM

## 2024-02-21 LAB — BASIC METABOLIC PANEL WITH GFR
Anion gap: 11 (ref 5–15)
BUN: 60 mg/dL — ABNORMAL HIGH (ref 8–23)
CO2: 21 mmol/L — ABNORMAL LOW (ref 22–32)
Calcium: 9.9 mg/dL (ref 8.9–10.3)
Chloride: 104 mmol/L (ref 98–111)
Creatinine, Ser: 2.37 mg/dL — ABNORMAL HIGH (ref 0.61–1.24)
GFR, Estimated: 27 mL/min — ABNORMAL LOW (ref >60)
Glucose, Bld: 95 mg/dL (ref 70–99)
Potassium: 4.7 mmol/L (ref 3.5–5.1)
Sodium: 136 mmol/L (ref 135–145)

## 2024-02-21 LAB — ECHOCARDIOGRAM COMPLETE
Area-P 1/2: 3.42 cm2
S' Lateral: 5.1 cm

## 2024-02-21 LAB — BRAIN NATRIURETIC PEPTIDE: B Natriuretic Peptide: 204.6 pg/mL — ABNORMAL HIGH (ref 0.0–100.0)

## 2024-02-21 MED ORDER — CARVEDILOL 6.25 MG PO TABS: 6.2500 mg | ORAL_TABLET | Freq: Two times a day (BID) | ORAL | 3 refills | Status: DC

## 2024-02-21 MED ORDER — PERFLUTREN LIPID MICROSPHERE
1.0000 mL | INTRAVENOUS | Status: DC | PRN
  Administered 2024-02-21: 4 mL via INTRAVENOUS

## 2024-02-21 MED ORDER — HYDRALAZINE HCL 25 MG PO TABS: 12.5000 mg | ORAL_TABLET | Freq: Three times a day (TID) | ORAL | 5 refills | Status: DC

## 2024-02-21 NOTE — Patient Instructions (Signed)
 Medication Changes:  DECREASE HYDRALAZINE  TO 12.5MG  THREE TIMES DAILY   DECREASE CARVEDILOL  TO 6.25MG  TWICE DAILY   Lab Work:  Labs done today, your results will be available in MyChart, we will contact you for abnormal readings  Follow-Up in: 3 MONTHS AS SCHEDULED   At the [facility], you and your health needs are our priority. We have a designated team specialized in the treatment of Heart Failure. This Care Team includes your primary Heart Failure Specialized Cardiologist (physician), Advanced Practice Providers (APPs- Physician Assistants and Nurse Practitioners), and Pharmacist who all work together to provide you with the care you need, when you need it.   You may see any of the following providers on your designated Care Team at your next follow up:  Dr. Jules Oar Dr. Peder Bourdon Dr. Alwin Baars Dr. Judyth Nunnery Nieves Bars, NP Ruddy Corral, [place] [facility] [place], [place] Dennise Fitz, NP [place] Lee, NP Luster Salters, PharmD   Please be sure to bring in all your medications bottles to every appointment.   Need to Contact Us :  If you have any questions or concerns before your next appointment please send us  a message through [place] or call our office at [phone].    TO LEAVE A MESSAGE FOR THE NURSE SELECT OPTION 2, PLEASE LEAVE A MESSAGE INCLUDING: YOUR NAME DATE OF BIRTH CALL BACK NUMBER REASON FOR CALL**this is important as we prioritize the call backs  YOU WILL RECEIVE A CALL BACK THE SAME DAY AS LONG AS YOU CALL BEFORE 4:00 PM

## 2024-02-25 NOTE — Addendum Note (Signed)
 Addended by: TITA AGE B on: 02/25/2024 04:28 PM   Modules accepted: Orders

## 2024-03-09 ENCOUNTER — Ambulatory Visit: Payer: Medicare Other

## 2024-03-09 DIAGNOSIS — I255 Ischemic cardiomyopathy: Secondary | ICD-10-CM | POA: Diagnosis not present

## 2024-03-09 LAB — CUP PACEART REMOTE DEVICE CHECK
Battery Remaining Longevity: 52 mo
Battery Remaining Percentage: 65 %
Battery Voltage: 2.96 V
Brady Statistic AP VP Percent: 5.9 %
Brady Statistic AP VS Percent: 1 %
Brady Statistic AS VP Percent: 94 %
Brady Statistic AS VS Percent: 1 %
Brady Statistic RA Percent Paced: 5.4 %
Date Time Interrogation Session: 20250707020924
HighPow Impedance: 73 Ohm
Implantable Lead Connection Status: 753985
Implantable Lead Connection Status: 753985
Implantable Lead Connection Status: 753985
Implantable Lead Implant Date: 20230709
Implantable Lead Implant Date: 20230709
Implantable Lead Implant Date: 20230709
Implantable Lead Location: 753858
Implantable Lead Location: 753859
Implantable Lead Location: 753860
Implantable Pulse Generator Implant Date: 20230709
Lead Channel Impedance Value: 440 Ohm
Lead Channel Impedance Value: 480 Ohm
Lead Channel Impedance Value: 860 Ohm
Lead Channel Pacing Threshold Amplitude: 0.75 V
Lead Channel Pacing Threshold Amplitude: 1 V
Lead Channel Pacing Threshold Amplitude: 2.125 V
Lead Channel Pacing Threshold Pulse Width: 0.5 ms
Lead Channel Pacing Threshold Pulse Width: 0.5 ms
Lead Channel Pacing Threshold Pulse Width: 1 ms
Lead Channel Sensing Intrinsic Amplitude: 12 mV
Lead Channel Sensing Intrinsic Amplitude: 3.2 mV
Lead Channel Setting Pacing Amplitude: 1.75 V
Lead Channel Setting Pacing Amplitude: 2 V
Lead Channel Setting Pacing Amplitude: 2.625
Lead Channel Setting Pacing Pulse Width: 0.5 ms
Lead Channel Setting Pacing Pulse Width: 1 ms
Lead Channel Setting Sensing Sensitivity: 0.5 mV
Pulse Gen Serial Number: 810036011

## 2024-04-04 ENCOUNTER — Ambulatory Visit: Payer: Self-pay | Admitting: Cardiology

## 2024-04-21 ENCOUNTER — Other Ambulatory Visit (HOSPITAL_COMMUNITY): Payer: Self-pay | Admitting: Family Medicine

## 2024-04-21 DIAGNOSIS — M545 Low back pain, unspecified: Secondary | ICD-10-CM

## 2024-05-15 NOTE — CV Procedure (Signed)
  Device system confirmed to be MRI conditional, with implant date > 6 weeks ago, and no evidence of abandoned or epicardial leads in review of most recent CXR  Device last cleared by EP Provider: Charlies Arthur 05/15/24  Clearance is good through for 1 year as long as parameters remain stable at time of check. If pt undergoes a cardiac device procedure during that time, they should be re-cleared.   Tachy-therapies to be programmed off if applicable with device back to pre-MRI settings after completion of exam.  [employer] will be present for programming for the MRI.   Rocky Catalan, RT  05/15/2024 4:18 PM

## 2024-05-19 ENCOUNTER — Ambulatory Visit (HOSPITAL_COMMUNITY)
Admission: RE | Admit: 2024-05-19 | Discharge: 2024-05-19 | Disposition: A | Discharge status: Home / Self Care | Source: Ambulatory Visit | Attending: Family Medicine | Admitting: Family Medicine

## 2024-05-28 ENCOUNTER — Encounter (HOSPITAL_COMMUNITY): Payer: Self-pay | Admitting: Cardiology

## 2024-05-28 ENCOUNTER — Ambulatory Visit (HOSPITAL_COMMUNITY)
Admission: RE | Admit: 2024-05-28 | Discharge: 2024-05-28 | Disposition: A | Discharge status: Home / Self Care | Source: Ambulatory Visit | Attending: Cardiology | Admitting: Cardiology

## 2024-05-28 VITALS — BP 102/60 | HR 71 | Wt 245.4 lb

## 2024-05-28 DIAGNOSIS — I252 Old myocardial infarction: Secondary | ICD-10-CM | POA: Diagnosis not present

## 2024-05-28 DIAGNOSIS — K651 Peritoneal abscess: Secondary | ICD-10-CM | POA: Diagnosis not present

## 2024-05-28 DIAGNOSIS — N189 Chronic kidney disease, unspecified: Secondary | ICD-10-CM | POA: Insufficient documentation

## 2024-05-28 DIAGNOSIS — M545 Low back pain, unspecified: Secondary | ICD-10-CM | POA: Insufficient documentation

## 2024-05-28 DIAGNOSIS — R29898 Other symptoms and signs involving the musculoskeletal system: Secondary | ICD-10-CM

## 2024-05-28 DIAGNOSIS — I255 Ischemic cardiomyopathy: Secondary | ICD-10-CM | POA: Insufficient documentation

## 2024-05-28 DIAGNOSIS — Z7984 Long term (current) use of oral hypoglycemic drugs: Secondary | ICD-10-CM | POA: Diagnosis not present

## 2024-05-28 DIAGNOSIS — Z1612 Extended spectrum beta lactamase (ESBL) resistance: Secondary | ICD-10-CM | POA: Insufficient documentation

## 2024-05-28 DIAGNOSIS — B9620 Unspecified Escherichia coli [E. coli] as the cause of diseases classified elsewhere: Secondary | ICD-10-CM | POA: Insufficient documentation

## 2024-05-28 DIAGNOSIS — J449 Chronic obstructive pulmonary disease, unspecified: Secondary | ICD-10-CM | POA: Diagnosis not present

## 2024-05-28 DIAGNOSIS — F17210 Nicotine dependence, cigarettes, uncomplicated: Secondary | ICD-10-CM | POA: Insufficient documentation

## 2024-05-28 DIAGNOSIS — Z79899 Other long term (current) drug therapy: Secondary | ICD-10-CM | POA: Diagnosis not present

## 2024-05-28 DIAGNOSIS — I5022 Chronic systolic (congestive) heart failure: Secondary | ICD-10-CM | POA: Diagnosis present

## 2024-05-28 DIAGNOSIS — E8770 Fluid overload, unspecified: Secondary | ICD-10-CM | POA: Diagnosis not present

## 2024-05-28 DIAGNOSIS — N1832 Chronic kidney disease, stage 3b: Secondary | ICD-10-CM

## 2024-05-28 DIAGNOSIS — I130 Hypertensive heart and chronic kidney disease with heart failure and stage 1 through stage 4 chronic kidney disease, or unspecified chronic kidney disease: Secondary | ICD-10-CM | POA: Diagnosis not present

## 2024-05-28 DIAGNOSIS — Z955 Presence of coronary angioplasty implant and graft: Secondary | ICD-10-CM | POA: Diagnosis not present

## 2024-05-28 DIAGNOSIS — Z8673 Personal history of transient ischemic attack (TIA), and cerebral infarction without residual deficits: Secondary | ICD-10-CM | POA: Insufficient documentation

## 2024-05-28 DIAGNOSIS — I2510 Atherosclerotic heart disease of native coronary artery without angina pectoris: Secondary | ICD-10-CM | POA: Insufficient documentation

## 2024-05-28 DIAGNOSIS — G4733 Obstructive sleep apnea (adult) (pediatric): Secondary | ICD-10-CM | POA: Insufficient documentation

## 2024-05-28 DIAGNOSIS — N390 Urinary tract infection, site not specified: Secondary | ICD-10-CM | POA: Insufficient documentation

## 2024-05-28 DIAGNOSIS — Z95810 Presence of automatic (implantable) cardiac defibrillator: Secondary | ICD-10-CM

## 2024-05-28 DIAGNOSIS — E785 Hyperlipidemia, unspecified: Secondary | ICD-10-CM | POA: Insufficient documentation

## 2024-05-28 LAB — BASIC METABOLIC PANEL WITH GFR
Anion gap: 12 (ref 5–15)
BUN: 53 mg/dL — ABNORMAL HIGH (ref 8–23)
CO2: 21 mmol/L — ABNORMAL LOW (ref 22–32)
Calcium: 9.7 mg/dL (ref 8.9–10.3)
Chloride: 110 mmol/L (ref 98–111)
Creatinine, Ser: 2.51 mg/dL — ABNORMAL HIGH (ref 0.61–1.24)
GFR, Estimated: 26 mL/min — ABNORMAL LOW (ref >60)
Glucose, Bld: 99 mg/dL (ref 70–99)
Potassium: 4.6 mmol/L (ref 3.5–5.1)
Sodium: 143 mmol/L (ref 135–145)

## 2024-05-28 LAB — BRAIN NATRIURETIC PEPTIDE: B Natriuretic Peptide: 452.8 pg/mL — ABNORMAL HIGH (ref 0.0–100.0)

## 2024-05-28 NOTE — Progress Notes (Signed)
 ReDS Vest / Clip - 05/28/24 1400       ReDS Vest / Clip   Station Marker C    Ruler Value 31    ReDS Value Range Low volume    ReDS Actual Value 36

## 2024-05-28 NOTE — Patient Instructions (Addendum)
 Medication Changes:  TAKE AN EXTRA DOSE OF FUROSEMIDE  40MG  TODAY--THEN RETURN TO NORMAL DOSING   Lab Work:  Labs done today, your results will be available in MyChart, we will contact you for abnormal readings.  Follow-Up in: 3 MONTHS WITH APP AS SCHEDULED   At the [facility], you and your health needs are our priority. We have a designated team specialized in the treatment of Heart Failure. This Care Team includes your primary Heart Failure Specialized Cardiologist (physician), Advanced Practice Providers (APPs- Physician Assistants and Nurse Practitioners), and Pharmacist who all work together to provide you with the care you need, when you need it.   You may see any of the following providers on your designated Care Team at your next follow up:  Dr. Toribio Fuel Dr. Ezra Shuck Dr. Ria Commander Dr. Odis Brownie Greig Mosses, NP Caffie Shed, [place] [facility] Susan Moore, [place] Beckey Coe, NP [place] Lee, NP Tinnie Redman, PharmD   Please be sure to bring in all your medications bottles to every appointment.   Need to Contact Us :  If you have any questions or concerns before your next appointment please send us  a message through [place] or call our office at [phone].    TO LEAVE A MESSAGE FOR THE NURSE SELECT OPTION 2, PLEASE LEAVE A MESSAGE INCLUDING: YOUR NAME DATE OF BIRTH CALL BACK NUMBER REASON FOR CALL**this is important as we prioritize the call backs  YOU WILL RECEIVE A CALL BACK THE SAME DAY AS LONG AS YOU CALL BEFORE 4:00 PM

## 2024-05-28 NOTE — Progress Notes (Signed)
 ADVANCED HEART FAILURE CLINIC NOTE  Referring Physician: Burney Darice CROME, MD  Primary Care: Burney Darice CROME, MD Primary Cardiologist: Dr. Burnard  CC: Heart Failure  HPI: Bobby [name] is a 78 y.o. 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  Admitted for ESBL E.coli infection with intra-abdominal abscess leading to significant deconditioning & frailty.   Interval hx:  - He is making good progress since his hospitalization for ESBL. His strength and exercise capacity has improved significantly. He can perform ADLs independently; however, is limited by low back pain   Current Outpatient Medications  Medication Sig Dispense Refill   albuterol  (PROVENTIL  HFA;VENTOLIN  HFA) 108 (90 Base) MCG/ACT inhaler Inhale 2 puffs into the lungs every 6 (six) hours as needed for wheezing or shortness of breath. 1 Inhaler 2   apixaban  (ELIQUIS ) 5 MG TABS tablet Take 1 tablet (5 mg total) by mouth 2 (two) times daily. 28 tablet 1   aspirin  EC 81 MG tablet Take 1 tablet (81 mg total) by mouth daily. Swallow whole. 90 tablet 3   atorvastatin  (LIPITOR ) 80 MG tablet Take 1 tablet (80 mg total) by mouth daily at 6 PM. 90 tablet 3   buPROPion  (WELLBUTRIN  XL) 300 MG 24 hr tablet Take 300 mg by mouth daily.     carvedilol  (COREG ) 6.25 MG tablet Take 1 tablet (6.25 mg total) by mouth 2 (two) times daily. 60 tablet 3   clonazePAM  (KLONOPIN ) 1 MG tablet Take 1 mg by mouth daily as needed for anxiety (sleep).     Coenzyme Q10 (COQ10 PO) Take 1 capsule by mouth daily.     colchicine  0.6 MG tablet Take 0.6 mg by mouth as needed.     dapagliflozin  propanediol (FARXIGA ) 5 MG TABS tablet  Take 5 mg by mouth daily.     furosemide  (LASIX ) 40 MG tablet Take 40 mg by mouth.     isosorbide  mononitrate (IMDUR ) 30 MG 24 hr tablet Take 1 tablet (30 mg total) by mouth daily.     nitroGLYCERIN  (NITROSTAT ) 0.4 MG SL tablet DISSOLVE 1 TABLET UNDER THE TONGUE EVERY 5 MINUTES AS NEEDED FOR CHEST PAIN. DO NOT EXCEED A TOTAL OF 3 DOSES IN 15 MINUTES. 25 tablet 10   Omega-3 Fatty Acids  (FISH OIL PO) Take 1 tablet by mouth daily.     oxyCODONE -acetaminophen  (PERCOCET) 10-325 MG tablet Take 1 tablet by mouth every 6 (six) hours as needed for pain. 14 tablet 0   sertraline  (ZOLOFT ) 100 MG tablet Take 100 mg by mouth every evening.     TRELEGY ELLIPTA 200-62.5-25 MCG/ACT AEPB Inhale 2 puffs into the lungs daily.     No current facility-administered medications for this encounter.   PHYSICAL EXAM: Vitals:   05/28/24 1350  BP: 102/60  Pulse: 71  SpO2: 92%   GENERAL: NAD Lungs- diffuse inspiratory crackles CARDIAC:  JVP: 5 cm          Normal rate with regular rhythm. no murmur.  Pulses 2+. No edema.  ABDOMEN: Soft, non-tender, non-distended.  EXTREMITIES: Warm and well perfused.  NEUROLOGIC: No obvious FND   DATA REVIEW  ECG: 04/22/23:A-V dual paced rhythm   ECHO: 10/17/22: LVEF 35%-40%, normal RV  function. 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  02/22/23: LVEF 35-40%  CATH: 08/12/2017: reviously placed Prox LAD to Mid LAD stent (unknown type) is widely patent. Mid Cx lesion is 99% stenosed. A drug-eluting stent was successfully placed using a STENT SIERRA 3.00 X 28 MM. Post intervention, there is a 0% residual stenosis. Ost 1st Mrg to 1st Mrg lesion is 85% stenosed. A drug-eluting stent was successfully placed using a STENT RESOLUTE ONYX 3.0X18. Post intervention, there is a 0% residual stenosis.   1. Successful stenting of the mid LCx with a DES 2. Successful stenting of the first OM with DES   ASSESSMENT &  PLAN:  Heart failure with reduced ejection fraction Etiology of HF: Ischemic cardiomyopathy with inferior MI in 2013 and anterior MI in 2018; also has significant left circumflex disease NYHA class / AHA Stage:NYHA II-III Volume status & Diuretics:  mildly hypervolemic, REDS 36% (abnormal). Take extra lasix  40mg  today.  Vasodilators: currently off all vasodilators due to low SBP.  Beta-Blocker:continue coreg  6.25mg  BID.  MRA:he has advanced CKD; repeat BMP/BNP today.  Cardiometabolic: farxiga  5mg  daily; will hold due to recent ESBL Ecoli UTI Devices therapies & Valvulopathies:CRT-D; reviewed device interrogation; BiV pacigin >99%. He has had several episodes of PMT. Called [employer] rep to increase PVARP.  Advanced therapies:Not a candidate.   2. CAD - s/p PCI to the RCA in 12/13 for inferior STEMI - s/p PCI to the LAD for anterior STEMI in 12/18 - s/p PCI to the mid LCX and OM1 in 12/18 -Continue Lipitor  80 mg daily.  Reports no chest pain.  3. Obstructive sleep apnea -Compliant with CPAP.   4. COPD -Currently on Trelegy and Brestri -Inspiratory crackles on exam -Educated to use trelegy daily; he has not been using I regularly.  -REDS 36% today  5. Chronic kidney disease - Holding SGLT2i  - sCr of 2.51 today; up from 2.37.  - BNP 452 (up from 204).   6. Hyperlipidemia - continue lipitor ; no myalgias. Doing well.   7. Tobacco use - continues to smoke 1/2PPD - discussed importance of tobacco cessation.   8. CKD - will repeat BMP/BNP - will consider fineronone 10mg    9. ESBL UTI - Holding farxiga  - repeat CBC/BMP/BNP today.   10. PMT - reviewed device interrogation today. No events.   I spent 40 minutes caring for this patient today including face to face time, ordering and reviewing labs, reviewing device interrogation, labs, counseling, seeing the patient, documenting in the record, and arranging follow ups.   Camey Edell [facility] Mechanical  Circulatory Support

## 2024-06-08 ENCOUNTER — Ambulatory Visit: Payer: Medicare Other

## 2024-06-08 ENCOUNTER — Telehealth: Payer: Self-pay | Admitting: Cardiology

## 2024-06-08 DIAGNOSIS — I255 Ischemic cardiomyopathy: Secondary | ICD-10-CM | POA: Diagnosis not present

## 2024-06-08 NOTE — Telephone Encounter (Signed)
 Spoke with pt's sister per [employer] regarding faxing last office note to PCP. Sister stated this was ok to do. Last office note sent via Epic to PCP.

## 2024-06-08 NOTE — Telephone Encounter (Signed)
 Caller (Joey) wants a copy of patient's last office visit faxed to PCP office.  Fax# [phone].

## 2024-06-09 ENCOUNTER — Telehealth (HOSPITAL_COMMUNITY): Payer: Self-pay | Admitting: Cardiology

## 2024-06-09 NOTE — Telephone Encounter (Signed)
 Bobby [name] with [facility] called to request most recent OV -records sent via epic routing    Ok'd by sister,DPR

## 2024-06-15 LAB — CUP PACEART REMOTE DEVICE CHECK
Battery Remaining Longevity: 49 mo
Battery Remaining Percentage: 62 %
Battery Voltage: 2.96 V
Brady Statistic AP VP Percent: 4.3 %
Brady Statistic AP VS Percent: 1 %
Brady Statistic AS VP Percent: 95 %
Brady Statistic AS VS Percent: 1 %
Brady Statistic RA Percent Paced: 3.9 %
Date Time Interrogation Session: 20251010193033
HighPow Impedance: 80 Ohm
Implantable Lead Connection Status: 753985
Implantable Lead Connection Status: 753985
Implantable Lead Connection Status: 753985
Implantable Lead Implant Date: 20230709
Implantable Lead Implant Date: 20230709
Implantable Lead Implant Date: 20230709
Implantable Lead Location: 753858
Implantable Lead Location: 753859
Implantable Lead Location: 753860
Implantable Pulse Generator Implant Date: 20230709
Lead Channel Impedance Value: 440 Ohm
Lead Channel Impedance Value: 450 Ohm
Lead Channel Impedance Value: 860 Ohm
Lead Channel Pacing Threshold Amplitude: 0.875 V
Lead Channel Pacing Threshold Amplitude: 1.125 V
Lead Channel Pacing Threshold Amplitude: 1.875 V
Lead Channel Pacing Threshold Pulse Width: 0.5 ms
Lead Channel Pacing Threshold Pulse Width: 0.5 ms
Lead Channel Pacing Threshold Pulse Width: 1 ms
Lead Channel Sensing Intrinsic Amplitude: 12 mV
Lead Channel Sensing Intrinsic Amplitude: 3.6 mV
Lead Channel Setting Pacing Amplitude: 1.875
Lead Channel Setting Pacing Amplitude: 2.125
Lead Channel Setting Pacing Amplitude: 2.375
Lead Channel Setting Pacing Pulse Width: 0.5 ms
Lead Channel Setting Pacing Pulse Width: 1 ms
Lead Channel Setting Sensing Sensitivity: 0.5 mV
Pulse Gen Serial Number: 810036011

## 2024-06-15 NOTE — Progress Notes (Signed)
 Remote ICD Transmission

## 2024-06-16 NOTE — Progress Notes (Signed)
 Remote ICD Transmission

## 2024-06-19 ENCOUNTER — Telehealth: Payer: Self-pay | Admitting: Student

## 2024-06-19 ENCOUNTER — Ambulatory Visit (HOSPITAL_COMMUNITY)
Admission: RE | Admit: 2024-06-19 | Discharge: 2024-06-19 | Disposition: A | Discharge status: Home / Self Care | Source: Ambulatory Visit | Attending: Family Medicine | Admitting: Family Medicine

## 2024-06-19 DIAGNOSIS — M545 Low back pain, unspecified: Secondary | ICD-10-CM | POA: Diagnosis present

## 2024-06-19 NOTE — Telephone Encounter (Signed)
*  STAT* If patient is at the pharmacy, call can be transferred to refill team.   1. Which medications need to be refilled? (please list name of each medication and dose if known) nitroGLYCERIN  (NITROSTAT ) 0.4 MG SL tablet    2. Would you like to learn more about the convenience, safety, & potential cost savings by using the [facility]? No   3. Are you open to using the [facility] (Type [facility]. No   4. Which pharmacy/location (including street and city if local pharmacy) is medication to be sent to?[facility] - [address], [place] - [address]    5. Do they need a 30 day or 90 day supply? 1 bottle

## 2024-06-22 MED ORDER — NITROGLYCERIN 0.4 MG SL SUBL: 0.4000 mg | SUBLINGUAL_TABLET | SUBLINGUAL | 1 refills | Status: AC | PRN

## 2024-06-22 NOTE — Telephone Encounter (Signed)
 RX sent in

## 2024-06-24 ENCOUNTER — Ambulatory Visit: Payer: Self-pay | Admitting: Cardiology

## 2024-08-31 ENCOUNTER — Ambulatory Visit (HOSPITAL_COMMUNITY)
Admission: RE | Admit: 2024-08-31 | Discharge: 2024-08-31 | Disposition: A | Discharge status: Home / Self Care | Source: Ambulatory Visit | Attending: Family Medicine | Admitting: Family Medicine

## 2024-08-31 ENCOUNTER — Encounter (HOSPITAL_COMMUNITY): Payer: Self-pay

## 2024-08-31 ENCOUNTER — Telehealth (HOSPITAL_COMMUNITY): Payer: Self-pay

## 2024-08-31 VITALS — BP 92/50 | HR 70 | Wt 251.6 lb

## 2024-08-31 DIAGNOSIS — I499 Cardiac arrhythmia, unspecified: Secondary | ICD-10-CM | POA: Diagnosis not present

## 2024-08-31 DIAGNOSIS — Z7901 Long term (current) use of anticoagulants: Secondary | ICD-10-CM | POA: Diagnosis not present

## 2024-08-31 DIAGNOSIS — Z7984 Long term (current) use of oral hypoglycemic drugs: Secondary | ICD-10-CM | POA: Insufficient documentation

## 2024-08-31 DIAGNOSIS — I513 Intracardiac thrombosis, not elsewhere classified: Secondary | ICD-10-CM | POA: Diagnosis not present

## 2024-08-31 DIAGNOSIS — N1832 Chronic kidney disease, stage 3b: Secondary | ICD-10-CM | POA: Diagnosis not present

## 2024-08-31 DIAGNOSIS — I255 Ischemic cardiomyopathy: Secondary | ICD-10-CM | POA: Diagnosis not present

## 2024-08-31 DIAGNOSIS — Z955 Presence of coronary angioplasty implant and graft: Secondary | ICD-10-CM | POA: Diagnosis not present

## 2024-08-31 DIAGNOSIS — Z87891 Personal history of nicotine dependence: Secondary | ICD-10-CM | POA: Insufficient documentation

## 2024-08-31 DIAGNOSIS — Z79899 Other long term (current) drug therapy: Secondary | ICD-10-CM | POA: Insufficient documentation

## 2024-08-31 DIAGNOSIS — Z720 Tobacco use: Secondary | ICD-10-CM | POA: Diagnosis not present

## 2024-08-31 DIAGNOSIS — I5022 Chronic systolic (congestive) heart failure: Secondary | ICD-10-CM | POA: Diagnosis not present

## 2024-08-31 DIAGNOSIS — Z8673 Personal history of transient ischemic attack (TIA), and cerebral infarction without residual deficits: Secondary | ICD-10-CM | POA: Diagnosis not present

## 2024-08-31 DIAGNOSIS — I2510 Atherosclerotic heart disease of native coronary artery without angina pectoris: Secondary | ICD-10-CM | POA: Diagnosis not present

## 2024-08-31 DIAGNOSIS — N189 Chronic kidney disease, unspecified: Secondary | ICD-10-CM | POA: Diagnosis not present

## 2024-08-31 DIAGNOSIS — E785 Hyperlipidemia, unspecified: Secondary | ICD-10-CM | POA: Insufficient documentation

## 2024-08-31 DIAGNOSIS — I130 Hypertensive heart and chronic kidney disease with heart failure and stage 1 through stage 4 chronic kidney disease, or unspecified chronic kidney disease: Secondary | ICD-10-CM | POA: Diagnosis not present

## 2024-08-31 DIAGNOSIS — I252 Old myocardial infarction: Secondary | ICD-10-CM | POA: Diagnosis not present

## 2024-08-31 DIAGNOSIS — G4733 Obstructive sleep apnea (adult) (pediatric): Secondary | ICD-10-CM | POA: Diagnosis not present

## 2024-08-31 DIAGNOSIS — J9610 Chronic respiratory failure, unspecified whether with hypoxia or hypercapnia: Secondary | ICD-10-CM | POA: Insufficient documentation

## 2024-08-31 DIAGNOSIS — Z7982 Long term (current) use of aspirin: Secondary | ICD-10-CM | POA: Insufficient documentation

## 2024-08-31 DIAGNOSIS — J449 Chronic obstructive pulmonary disease, unspecified: Secondary | ICD-10-CM | POA: Diagnosis not present

## 2024-08-31 DIAGNOSIS — Z86718 Personal history of other venous thrombosis and embolism: Secondary | ICD-10-CM | POA: Insufficient documentation

## 2024-08-31 LAB — LIPID PANEL
Cholesterol: 178 mg/dL (ref 0–200)
HDL: 49 mg/dL
LDL Cholesterol: 97 mg/dL (ref 0–99)
Total CHOL/HDL Ratio: 3.6 ratio
Triglycerides: 157 mg/dL — ABNORMAL HIGH
VLDL: 31 mg/dL (ref 0–40)

## 2024-08-31 LAB — CBC
HCT: 44.4 % (ref 39.0–52.0)
Hemoglobin: 14.4 g/dL (ref 13.0–17.0)
MCH: 29 pg (ref 26.0–34.0)
MCHC: 32.4 g/dL (ref 30.0–36.0)
MCV: 89.3 fL (ref 80.0–100.0)
Platelets: 190 K/uL (ref 150–400)
RBC: 4.97 MIL/uL (ref 4.22–5.81)
RDW: 14.1 % (ref 11.5–15.5)
WBC: 8.9 K/uL (ref 4.0–10.5)
nRBC: 0 % (ref 0.0–0.2)

## 2024-08-31 LAB — BASIC METABOLIC PANEL WITH GFR
Anion gap: 12 (ref 5–15)
BUN: 73 mg/dL — ABNORMAL HIGH (ref 8–23)
CO2: 24 mmol/L (ref 22–32)
Calcium: 10.1 mg/dL (ref 8.9–10.3)
Chloride: 101 mmol/L (ref 98–111)
Creatinine, Ser: 2.96 mg/dL — ABNORMAL HIGH (ref 0.61–1.24)
GFR, Estimated: 21 mL/min — ABNORMAL LOW
Glucose, Bld: 135 mg/dL — ABNORMAL HIGH (ref 70–99)
Potassium: 4.5 mmol/L (ref 3.5–5.1)
Sodium: 137 mmol/L (ref 135–145)

## 2024-08-31 LAB — PRO BRAIN NATRIURETIC PEPTIDE: Pro Brain Natriuretic Peptide: 1876 pg/mL — ABNORMAL HIGH

## 2024-08-31 MED ORDER — ISOSORBIDE MONONITRATE ER 30 MG PO TB24: 15.0000 mg | ORAL_TABLET | Freq: Every day | ORAL | Status: AC

## 2024-08-31 NOTE — Telephone Encounter (Signed)
Office referral received for Cardiac rehab, given to RN to review.[name]

## 2024-08-31 NOTE — Progress Notes (Signed)
 "  ADVANCED HEART FAILURE CLINIC NOTE  Primary Care: Bobby Darice CROME, MD Primary Cardiologist: Bobby [name] EP: Dr. Inocencio HF Cardiologist: assign to Dr. Zenaida  HPI: Bobby [name] is a 78 y.o. 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  Admitted 2/25 for ESBL E.coli infection with intra-abdominal abscess leading to significant deconditioning & frailty.   Today he returns for HF follow up with his sister. Overall feeling fine. Just finished prednisone  for COPD exacerbation, feels like he may have fluid on board. Breathing not great today. He is chronically SOB walking on flat ground. Took Lasix  80 bid x 2 days, urinated briskly but breathing stayed about the same. He has occasional dizziness with ADLs, not recent falls. Denies palpitations, abnormal bleeding, CP, edema, or PND/Orthopnea. Appetite ok. Weight at home 251 pounds. Taking all medications. No tobacco use in 11 months. Does not wear CPAP. Sister has aide come by house to arrange pill box. Bobby [name] lives alone, sister lives in [place].   Current Outpatient Medications  Medication Sig Dispense Refill   albuterol  (PROVENTIL  HFA;VENTOLIN  HFA) 108 (90 Base) MCG/ACT inhaler Inhale 2 puffs into the lungs every 6 (six) hours as needed for wheezing or shortness of breath. 1 Inhaler 2   apixaban  (ELIQUIS ) 5 MG TABS tablet Take 1 tablet (5 mg total) by mouth 2 (two) times daily. 28 tablet 1   aspirin  EC 81 MG tablet Take 1 tablet (81 mg total) by mouth daily. Swallow whole. 90 tablet 3   atorvastatin  (LIPITOR ) 80 MG tablet Take 1 tablet (80 mg total) by mouth daily at 6 PM. 90 tablet 3   buPROPion  (WELLBUTRIN  XL) 300 MG 24 hr tablet Take  300 mg by mouth daily.     carvedilol  (COREG ) 12.5 MG tablet Take 12.5 mg by mouth 2 (two) times daily with a meal.     clonazePAM  (KLONOPIN ) 1 MG tablet Take 1 mg by mouth daily as needed for anxiety (sleep).     Coenzyme Q10 (COQ10 PO) Take 1 capsule by mouth daily.     colchicine  0.6 MG tablet Take 0.6 mg by mouth as needed.     dapagliflozin  propanediol (FARXIGA ) 5 MG TABS tablet Take 5 mg by mouth daily.     furosemide  (LASIX ) 40 MG tablet Take 40 mg by mouth 2 (two) times daily.     isosorbide  mononitrate (IMDUR ) 30 MG 24 hr tablet Take 1 tablet (30 mg total) by mouth daily.     nitroGLYCERIN  (NITROSTAT ) 0.4 MG SL tablet Place 1 tablet (0.4 mg total) under the tongue every 5 (five) minutes as needed for chest pain. DO NOT EXCEED A TOTAL OF 3 DOSES IN 15 MINUTES. 25 tablet 1   Omega-3 Fatty Acids  (FISH OIL PO) Take 1 tablet by mouth daily.     oxyCODONE -acetaminophen  (PERCOCET) 10-325 MG tablet Take 1 tablet by mouth every 6 (six) hours as needed for pain. 14 tablet 0   sertraline  (ZOLOFT ) 100 MG tablet Take 100 mg by mouth every evening.     TRELEGY ELLIPTA 200-62.5-25 MCG/ACT AEPB Inhale 2 puffs into the lungs daily.     No current facility-administered medications for this encounter.   Wt Readings from Last 3 Encounters:  08/31/24 114.1 kg (251 lb 9.6 oz)  05/28/24 111.3 kg (245 lb 6.4 oz)  02/21/24 105.8 kg (233 lb 3.2 oz)   BP (!) 92/50   Pulse 70   Wt 114.1 kg (251 lb 9.6 oz)   SpO2 90%   BMI 37.15 kg/m   PHYSICAL EXAM: General:  NAD. No resp difficulty, walked into clinic, elderly HEENT: Normal Neck: Supple. No JVD. Cor: Regular rate & rhythm. No rubs, gallops or murmurs. Lungs: Clear Abdomen: Obese,  nontender, nondistended.  Extremities: No cyanosis, clubbing, rash, edema Neuro: Alert & oriented x 3, moves all 4 extremities w/o difficulty. Affect pleasant.  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  10/17/22: LVEF 35%-40%, normal RV function.  02/22/23: LVEF 35-40% 02/21/24: LVEF 30-35%, G1DD, RV normal  CATH: 08/12/2017: reviously placed Prox LAD to Mid LAD stent (unknown type) is widely patent. Mid Cx lesion is 99% stenosed. A drug-eluting stent was successfully placed using a STENT SIERRA 3.00 X 28 MM. Post intervention, there is a 0% residual stenosis. Ost 1st Mrg to 1st Mrg lesion is 85% stenosed. A drug-eluting stent was successfully placed using a STENT RESOLUTE ONYX 3.0X18. Post intervention, there is a 0% residual stenosis.   1. Successful stenting of the mid LCx with a DES 2. Successful stenting of the first OM with DES   ASSESSMENT & PLAN:  Heart failure with reduced ejection fraction - Ischemic cardiomyopathy with inferior MI in 2013 and anterior MI in 2018; also has significant left circumflex disease - NYHA II-III, functional class confounded by COPD and deconditioning. Volume stable. GDMT limited by low BP & CKD - Decrease Imdur  to 15 mg daily with low BP. - Continue Lasix  40 mg bid. - Continue Farxiga  5 mg daily. No GU symptoms. - Continue Coreg  12.5 mg bid. - No MRA or ARB/ARNI with CKD.   - Labs today. - Refer to Cardiac Rehab.  CAD - s/p PCI to the RCA in 12/13 for inferior STEMI. - s/p PCI to the LAD for anterior STEMI in 12/18. - s/p PCI to the mid LCX and OM1 in 12/18. - No chest pain. - On ASA, consider stopping with need for [facility]. Defer to General Cardiology. - Continue atorvastatin  80 mg daily. - Needs general cardiology follow up to manage CAD; previous patient of Bobby [name].  CKD - Baseline SCr 2.2-2.4. - Consider finerenone.  - Labs today.  Obstructive sleep apnea - Does not wear CPAP.  COPD/Chronic Respiratory Failure - Continue inhalers - Continue 2L oxygen . - Follows with Pulmonary.  HLD - Continue  atorvastatin  80 mg daily. - Check lipids today.  Tobacco use - No tobacco in 11 months!   PMT - reviewed device interrogation today.    Apical thrombus - No thrombus on most recent echo 6/25. - On Eliquis  5 bid. No bleeding issues. - CBC today  Follow up in 3-4 months with Dr. Zenaida Harlene Gainer, FNP-BC 08/31/2024  "

## 2024-08-31 NOTE — Patient Instructions (Addendum)
 CHANGE Imdur  to 15 mg ( 1/2 Tab) daily.  Labs done today, your results will be available in MyChart, we will contact you for abnormal readings.  You have been referred to Cardiac Rehab. They will call you to arrange your appointment.  Your physician recommends that you schedule a follow-up appointment in: 4 months ( April 2026) ** PLEASE CALL THE OFFICE IN FEBRUARY TO ARRANGE YOUR FOLLOW UP APPOINTMENT.**  If you have any questions or concerns before your next appointment please send us  a message through [place] or call our office at [phone].    TO LEAVE A MESSAGE FOR THE NURSE SELECT OPTION 2, PLEASE LEAVE A MESSAGE INCLUDING: YOUR NAME DATE OF BIRTH CALL BACK NUMBER REASON FOR CALL**this is important as we prioritize the call backs  YOU WILL RECEIVE A CALL BACK THE SAME DAY AS LONG AS YOU CALL BEFORE 4:00 PM  At the [facility], you and your health needs are our priority. As part of our continuing mission to provide you with exceptional heart care, we have created designated Provider Care Teams. These Care Teams include your primary Cardiologist (physician) and Advanced Practice Providers (APPs- Physician Assistants and Nurse Practitioners) who all work together to provide you with the care you need, when you need it.   You may see any of the following providers on your designated Care Team at your next follow up: Dr. Toribio Fuel Dr. Ezra Shuck Dr. Morene Brownie Amy Lenetta, NP Caffie Shed, [place] Harlene Gainer, NP Manuelita Dutch, [place] Beckey Coe, NP Jordan Lee, NP Ellouise Class, NP Tinnie Redman, PharmD Jaun Bash, PharmD  Please be sure to bring in all your medications bottles to every appointment.    Thank you for choosing [facility]

## 2024-08-31 NOTE — Telephone Encounter (Signed)
 Called patient to see if he is interested in the Cardiac Rehab Program. Patient's sister stated that pt expressed interest. Explained scheduling process and went over insurance process, patient's sister verbalized understanding. Will contact patient for scheduling once he has been reviewed.

## 2024-09-01 ENCOUNTER — Ambulatory Visit (HOSPITAL_COMMUNITY): Payer: Self-pay | Admitting: Family Medicine

## 2024-09-01 DIAGNOSIS — I5022 Chronic systolic (congestive) heart failure: Secondary | ICD-10-CM

## 2024-09-01 NOTE — Telephone Encounter (Addendum)
 Pt aware, agreeable, and verbalized understanding  Referral sent   ----- Message from Harlene CHRISTELLA Gainer sent at 09/01/2024  1:46 PM EST ----- Renal function a bit worse.  Reduce Lasix  to 40 mg q am and 20 mg qpm.  Repeat BMET In 10-14 days  LDL elevated, please ask if he has been taking his atorvastatin  80 mg faithfully.

## 2024-09-04 ENCOUNTER — Telehealth (HOSPITAL_COMMUNITY): Payer: Self-pay | Admitting: *Deleted

## 2024-09-04 NOTE — Telephone Encounter (Signed)
-----   Message from Harlene CHRISTELLA Gainer sent at 09/02/2024  1:46 PM EST ----- Regarding: RE: use of oxygen  during cardiac rehab [facility], thanks for reaching out. Yes, totally fine to use O2 as needed to maintain sat > 90% during sessions  Thanks! Harlene ----- Message ----- From: Bernett Saturnino NOVAK, RN Sent: 09/02/2024   1:22 PM EST To: Harlene CHRISTELLA Gainer, FNP Subject: use of oxygen  during cardiac rehab              Hi Jessica!   Hope you are doing well:)   Thank you for the referral of the above pt we look forward to working with him.  Medical history  with the use of oxygen  therapy at night.  We will monitor pt O2 saturations pre, during and post exercise. In the event O2 therapy is needed.   For our cardiac patients, we do ask for an ok to use oxygen  during exercise in the event of low saturation and to titrate to maintain the set  acceptable oxygen  saturation.   Typically for our cardiac pt's we maintain maintain O2 saturation of 90% and above at rest and on exertion.  If appropriate for this pt, ok to titrate oxygen  level to maintain this saturation?   Thanks so much for your response. This will serve as documentation and saved to the patient's chart.   Saturnino Bernett PEAK, BSN  Cardiac and Emergency Planning/management Officer

## 2024-09-09 ENCOUNTER — Telehealth (HOSPITAL_COMMUNITY): Payer: Self-pay | Admitting: Licensed Clinical Social Worker

## 2024-09-09 NOTE — Telephone Encounter (Signed)
 H&V Care Navigation CSW Progress Note  Clinical Social Worker consulted to assist pt with transportation concerns.  CSW called preferred number which reached patient sister who we have DPR for.  She confirms she often helps with arranging things for him and is how he gets to and from most his appointments.  States that she was only needing help with transport when he was planning to go to frequent physical therapy visits- patient now unwilling to do this so they no longer need transportation assistance.  CSW informed sister of patients transport available through [facility] in case they need to utilize again in the future.  Andriette HILARIO Leech, LCSW Clinical Social Worker [facility] Desk#: [phone] Cell#: [phone]

## 2024-09-11 ENCOUNTER — Encounter: Payer: Self-pay | Admitting: Cardiology

## 2024-09-14 ENCOUNTER — Ambulatory Visit (HOSPITAL_COMMUNITY): Payer: Self-pay | Admitting: Family Medicine

## 2024-09-14 ENCOUNTER — Ambulatory Visit (HOSPITAL_COMMUNITY)
Admission: RE | Admit: 2024-09-14 | Discharge: 2024-09-14 | Disposition: A | Source: Ambulatory Visit | Attending: Cardiology

## 2024-09-14 DIAGNOSIS — I5022 Chronic systolic (congestive) heart failure: Secondary | ICD-10-CM | POA: Diagnosis present

## 2024-09-14 LAB — BASIC METABOLIC PANEL WITH GFR
Anion gap: 11 (ref 5–15)
BUN: 64 mg/dL — ABNORMAL HIGH (ref 8–23)
CO2: 23 mmol/L (ref 22–32)
Calcium: 10.4 mg/dL — ABNORMAL HIGH (ref 8.9–10.3)
Chloride: 103 mmol/L (ref 98–111)
Creatinine, Ser: 2.83 mg/dL — ABNORMAL HIGH (ref 0.61–1.24)
GFR, Estimated: 22 mL/min — ABNORMAL LOW
Glucose, Bld: 123 mg/dL — ABNORMAL HIGH (ref 70–99)
Potassium: 5 mmol/L (ref 3.5–5.1)
Sodium: 136 mmol/L (ref 135–145)

## 2024-09-21 NOTE — Progress Notes (Unsigned)
 "     Cardiology Office Note Date:  09/22/2024  ID:  Bobby [name], DOB 12/08/1945, MRN [id number] PCP:  Bobby Darice CROME, MD  Cardiologist:  Bobby VEAR Ren Donley, MD  Chief Complaint  Patient presents with   Coronary Artery Disease     Problems CAD Inferior STEMI s/p PCI RCA 12/13 Anterior STEMI s/p PCI LAD 12/18 OM PCI 12/18 HFrEF s/p CRT-D c/b LVT (now resolved) TTE 6/25: 30-35%, WMA, mild LV, mild LVH, mod LA CKDIV Remote TIA COPD on 2L OSA not on CPAP Bobby [name], ASA81, AN80, CL12.5, DN5, FE40, ISMN15  Visits  CHF 12/25: NYHA II-III; decreased Imdur  to 15 given low BP, no ARB/MRA given CKD, referral to cardiac rehab, consider d/c ASA per gen cards, LP 12/25 Phone: Kidney function worse --> reduce FE to 40 in AM and 20 in PM 1/26: d/c ASA, LDL 97--> EE10, repeat BMP/Mag, sleep referral     Discussed the use of AI scribe software for clinical note transcription with the patient, who gave verbal consent to proceed.  History of Present Illness Bobby [name] is a 79 year old male with heart failure and COPD who presents for follow-up regarding his breathing difficulties and recent appendicitis. He is accompanied by his sister. He has a history of heart failure and COPD, experiencing chronic breathing difficulties without significant changes over the past six months. He uses four pillows at night due to orthopnea and sometimes feels short of breath when lying flat. He has been diagnosed with sleep apnea but does not use a CPAP machine due to discomfort. His sister notes severe sleep apnea since childhood. In January 2025, he was hospitalized for a burst appendix, which affected his mobility and recovery. He feels improved but not back to baseline. No new chest pain, leg swelling, or abdominal tightness is reported. He has a history of back trouble and recently completed a round of injections for it. His legs give out, limiting his ability to walk, such as in a grocery store. He is  currently taking Eliquis  and aspirin .   ROS: Please see the history of present illness. All other systems are reviewed and negative.   PHYSICAL EXAM: VS:  BP 133/79 (BP Location: Left Arm, Patient Position: Sitting)   Pulse 67   Ht 5' 9 (1.753 m)   Wt 255 lb 9.6 oz (115.9 kg)   SpO2 96%   BMI 37.75 kg/m  , BMI Body mass index is 37.75 kg/m. GEN: Well nourished, well developed, in no acute distress HEENT: normal Neck: no JVD, carotid bruits, or masses Cardiac: RRR; no murmurs, rubs, or gallops,no edema  Respiratory:  CTAB bilaterally, normal work of breathing GI: soft, nontender, nondistended, + BS Extremities: No LE edema Skin: warm and dry, no rash Neuro:  Strength and sensation are intact  Recent Labs: Reviewed  Studies: Reviewed  Assessment and Plan Assessment & Plan Chronic heart failure with reduced ejection fraction Recent hospitalization for appendicitis in January 2025, with ongoing recovery but dyspnea appears stable. Reports trouble breathing and history of hospitalization due to lung issues. - Rechecked kidney function labs to ensure stability and adjust diuretics as needed. - Patient is not interested in rehab at this time  Coronary artery disease Multiple myocardial infarctions with elevated cholesterol levels at 97 mg/dL, above target due to history. - Started additional cholesterol medication to achieve target levels. - Discontinued aspirin  due to concurrent use of Eliquis , increasing bleeding risk.  Chronic kidney disease stage 4 Recent  diuretic adjustments with stable kidney function and no new symptoms. - Rechecked kidney function labs to ensure stability and adjust diuretics as needed.  Obstructive sleep apnea Severe, untreated with significant sleep disturbances and apneic episodes. No current CPAP use due to discomfort. - Referred to sleep specialists for evaluation and management. - Discussed alternative CPAP options, such as nasal masks, with  insurance considerations.  Hyperlipidemia Elevated cholesterol levels at 97 mg/dL, above target due to history of myocardial infarctions. - Started ezetimibe  10 mg daily   Signed, Bobby VEAR Ren Donley, MD  09/22/2024 11:23 AM    [facility] "

## 2024-09-22 ENCOUNTER — Ambulatory Visit

## 2024-09-22 ENCOUNTER — Telehealth: Payer: Self-pay

## 2024-09-22 VITALS — BP 133/79 | HR 67 | Ht 69.0 in | Wt 255.6 lb

## 2024-09-22 DIAGNOSIS — G4733 Obstructive sleep apnea (adult) (pediatric): Secondary | ICD-10-CM

## 2024-09-22 DIAGNOSIS — I2510 Atherosclerotic heart disease of native coronary artery without angina pectoris: Secondary | ICD-10-CM | POA: Diagnosis not present

## 2024-09-22 DIAGNOSIS — E785 Hyperlipidemia, unspecified: Secondary | ICD-10-CM | POA: Diagnosis not present

## 2024-09-22 DIAGNOSIS — I10 Essential (primary) hypertension: Secondary | ICD-10-CM | POA: Diagnosis not present

## 2024-09-22 DIAGNOSIS — I5022 Chronic systolic (congestive) heart failure: Secondary | ICD-10-CM

## 2024-09-22 DIAGNOSIS — N184 Chronic kidney disease, stage 4 (severe): Secondary | ICD-10-CM

## 2024-09-22 DIAGNOSIS — Z95810 Presence of automatic (implantable) cardiac defibrillator: Secondary | ICD-10-CM

## 2024-09-22 DIAGNOSIS — J449 Chronic obstructive pulmonary disease, unspecified: Secondary | ICD-10-CM | POA: Diagnosis not present

## 2024-09-22 MED ORDER — EZETIMIBE 10 MG PO TABS: 10.0000 mg | ORAL_TABLET | Freq: Every day | ORAL | 3 refills | Status: AC

## 2024-09-22 NOTE — Patient Instructions (Addendum)
 Medication Instructions:  Stop Aspirin   Start Zetia  10 mg take one tablet daily *If you need a refill on your cardiac medications before your next appointment, please call your pharmacy*  Lab Work: Today- Mg2, BMP If you have labs (blood work) drawn today and your tests are completely normal, you will receive your results only by: MyChart Message (if you have MyChart) OR A paper copy in the mail If you have any lab test that is abnormal or we need to change your treatment, we will call you to review the results.   Follow-Up: At [facility], you and your health needs are our priority.  As part of our continuing mission to provide you with exceptional heart care, our providers are all part of one team.  This team includes your primary Cardiologist (physician) and Advanced Practice Providers or APPs (Physician Assistants and Nurse Practitioners) who all work together to provide you with the care you need, when you need it.  Your next appointment:   1 year(s)  Provider:   Joelle VEAR Ren Donley, MD    First available with Dr Shlomo to establish care for sleep.

## 2024-09-22 NOTE — Telephone Encounter (Signed)
 Bobby [name] is requesting for the most recent sleep study to be faxed to her at [phone]. The request has been uploaded into the media tab.

## 2024-09-23 ENCOUNTER — Ambulatory Visit: Payer: Self-pay

## 2024-09-23 LAB — BASIC METABOLIC PANEL WITH GFR
BUN/Creatinine Ratio: 19 (ref 10–24)
BUN: 44 mg/dL — ABNORMAL HIGH (ref 8–27)
CO2: 20 mmol/L (ref 20–29)
Calcium: 9.8 mg/dL (ref 8.6–10.2)
Chloride: 108 mmol/L — ABNORMAL HIGH (ref 96–106)
Creatinine, Ser: 2.32 mg/dL — ABNORMAL HIGH (ref 0.76–1.27)
Glucose: 99 mg/dL (ref 70–99)
Potassium: 4.8 mmol/L (ref 3.5–5.2)
Sodium: 142 mmol/L (ref 134–144)
eGFR: 28 mL/min/1.73 — ABNORMAL LOW

## 2024-09-23 LAB — MAGNESIUM: Magnesium: 2.2 mg/dL (ref 1.6–2.3)

## 2024-09-23 NOTE — Telephone Encounter (Signed)
 I have faxed the 02/25/2018 sleep study to the [phone] fax number as requested.

## 2024-09-28 ENCOUNTER — Ambulatory Visit: Admitting: Pharmacist

## 2024-11-12 ENCOUNTER — Ambulatory Visit: Admitting: Pharmacist

## 2024-12-07 ENCOUNTER — Encounter

## 2025-01-12 ENCOUNTER — Ambulatory Visit: Admitting: Cardiology

## 2025-03-08 ENCOUNTER — Encounter

## 2025-06-07 ENCOUNTER — Encounter

## 2025-09-06 ENCOUNTER — Encounter

## 2025-12-06 ENCOUNTER — Encounter

## 2026-03-07 ENCOUNTER — Encounter
